# Patient Record
Sex: Female | Born: 1945 | Race: White | Hispanic: No | Marital: Married | Smoking: Former smoker
Health system: Southern US, Community
[De-identification: ages and names within clinical notes are randomized; demographics above are authoritative.]

## PROBLEM LIST (undated history)

## (undated) DIAGNOSIS — I639 Cerebral infarction, unspecified: Secondary | ICD-10-CM

## (undated) DIAGNOSIS — N289 Disorder of kidney and ureter, unspecified: Secondary | ICD-10-CM

## (undated) DIAGNOSIS — I700 Atherosclerosis of aorta: Secondary | ICD-10-CM

## (undated) DIAGNOSIS — F32A Depression, unspecified: Secondary | ICD-10-CM

## (undated) DIAGNOSIS — M1990 Unspecified osteoarthritis, unspecified site: Secondary | ICD-10-CM

## (undated) DIAGNOSIS — R1310 Dysphagia, unspecified: Secondary | ICD-10-CM

## (undated) DIAGNOSIS — R32 Unspecified urinary incontinence: Secondary | ICD-10-CM

## (undated) DIAGNOSIS — R627 Adult failure to thrive: Secondary | ICD-10-CM

## (undated) DIAGNOSIS — U071 COVID-19: Secondary | ICD-10-CM

## (undated) DIAGNOSIS — I712 Thoracic aortic aneurysm, without rupture, unspecified: Secondary | ICD-10-CM

## (undated) DIAGNOSIS — Z87442 Personal history of urinary calculi: Secondary | ICD-10-CM

## (undated) DIAGNOSIS — E0590 Thyrotoxicosis, unspecified without thyrotoxic crisis or storm: Secondary | ICD-10-CM

## (undated) DIAGNOSIS — I10 Essential (primary) hypertension: Secondary | ICD-10-CM

## (undated) DIAGNOSIS — J690 Pneumonitis due to inhalation of food and vomit: Secondary | ICD-10-CM

## (undated) DIAGNOSIS — K219 Gastro-esophageal reflux disease without esophagitis: Secondary | ICD-10-CM

## (undated) DIAGNOSIS — F419 Anxiety disorder, unspecified: Secondary | ICD-10-CM

## (undated) HISTORY — DX: Cerebral infarction, unspecified: I63.9

## (undated) HISTORY — DX: Pneumonitis due to inhalation of food and vomit: J69.0

## (undated) HISTORY — DX: Disorder of kidney and ureter, unspecified: N28.9

## (undated) HISTORY — DX: COVID-19: U07.1

---

## 2021-03-05 ENCOUNTER — Emergency Department (HOSPITAL_COMMUNITY): Payer: Self-pay

## 2021-03-05 ENCOUNTER — Encounter (HOSPITAL_COMMUNITY): Payer: Self-pay | Admitting: Emergency Medicine

## 2021-03-05 ENCOUNTER — Other Ambulatory Visit: Payer: Self-pay

## 2021-03-05 ENCOUNTER — Emergency Department (HOSPITAL_COMMUNITY)
Admission: EM | Admit: 2021-03-05 | Discharge: 2021-03-05 | Disposition: A | Discharge status: Home / Self Care | Payer: Self-pay | Attending: Emergency Medicine | Admitting: Emergency Medicine

## 2021-03-05 DIAGNOSIS — K5730 Diverticulosis of large intestine without perforation or abscess without bleeding: Secondary | ICD-10-CM | POA: Insufficient documentation

## 2021-03-05 DIAGNOSIS — M545 Low back pain, unspecified: Secondary | ICD-10-CM

## 2021-03-05 DIAGNOSIS — R350 Frequency of micturition: Secondary | ICD-10-CM

## 2021-03-05 DIAGNOSIS — F419 Anxiety disorder, unspecified: Secondary | ICD-10-CM | POA: Insufficient documentation

## 2021-03-05 DIAGNOSIS — Z87891 Personal history of nicotine dependence: Secondary | ICD-10-CM | POA: Insufficient documentation

## 2021-03-05 DIAGNOSIS — I10 Essential (primary) hypertension: Secondary | ICD-10-CM | POA: Insufficient documentation

## 2021-03-05 DIAGNOSIS — N1330 Unspecified hydronephrosis: Secondary | ICD-10-CM | POA: Insufficient documentation

## 2021-03-05 DIAGNOSIS — R319 Hematuria, unspecified: Secondary | ICD-10-CM

## 2021-03-05 DIAGNOSIS — N200 Calculus of kidney: Secondary | ICD-10-CM | POA: Insufficient documentation

## 2021-03-05 DIAGNOSIS — N858 Other specified noninflammatory disorders of uterus: Secondary | ICD-10-CM | POA: Insufficient documentation

## 2021-03-05 HISTORY — DX: Essential (primary) hypertension: I10

## 2021-03-05 HISTORY — DX: Anxiety disorder, unspecified: F41.9

## 2021-03-05 LAB — URINALYSIS, ROUTINE W REFLEX MICROSCOPIC
Bacteria, UA: NONE SEEN
Bilirubin Urine: NEGATIVE
Glucose, UA: NEGATIVE mg/dL
Ketones, ur: 20 mg/dL — AB
Leukocytes,Ua: NEGATIVE
Nitrite: NEGATIVE
Protein, ur: 100 mg/dL — AB
Specific Gravity, Urine: 1.021 (ref 1.005–1.030)
pH: 5 (ref 5.0–8.0)

## 2021-03-05 LAB — CBC WITH DIFFERENTIAL/PLATELET
Abs Immature Granulocytes: 0.06 10*3/uL (ref 0.00–0.07)
Basophils Absolute: 0 10*3/uL (ref 0.0–0.1)
Basophils Relative: 0 %
Eosinophils Absolute: 0 10*3/uL (ref 0.0–0.5)
Eosinophils Relative: 0 %
HCT: 41.8 % (ref 36.0–46.0)
Hemoglobin: 13.9 g/dL (ref 12.0–15.0)
Immature Granulocytes: 1 %
Lymphocytes Relative: 12 %
Lymphs Abs: 1.1 10*3/uL (ref 0.7–4.0)
MCH: 30.2 pg (ref 26.0–34.0)
MCHC: 33.3 g/dL (ref 30.0–36.0)
MCV: 90.9 fL (ref 80.0–100.0)
Monocytes Absolute: 0.4 10*3/uL (ref 0.1–1.0)
Monocytes Relative: 5 %
Neutro Abs: 7 10*3/uL (ref 1.7–7.7)
Neutrophils Relative %: 82 %
Platelets: 199 10*3/uL (ref 150–400)
RBC: 4.6 MIL/uL (ref 3.87–5.11)
RDW: 14.8 % (ref 11.5–15.5)
WBC: 8.6 10*3/uL (ref 4.0–10.5)
nRBC: 0 % (ref 0.0–0.2)

## 2021-03-05 LAB — BASIC METABOLIC PANEL
Anion gap: 10 (ref 5–15)
BUN: 37 mg/dL — ABNORMAL HIGH (ref 8–23)
CO2: 22 mmol/L (ref 22–32)
Calcium: 11 mg/dL — ABNORMAL HIGH (ref 8.9–10.3)
Chloride: 106 mmol/L (ref 98–111)
Creatinine, Ser: 1.5 mg/dL — ABNORMAL HIGH (ref 0.44–1.00)
GFR, Estimated: 37 mL/min — ABNORMAL LOW (ref >60)
Glucose, Bld: 87 mg/dL (ref 70–99)
Potassium: 4.1 mmol/L (ref 3.5–5.1)
Sodium: 138 mmol/L (ref 135–145)

## 2021-03-05 MED ORDER — CEPHALEXIN 500 MG PO CAPS: 500.0000 mg | ORAL_CAPSULE | Freq: Three times a day (TID) | ORAL | 0 refills | Status: DC

## 2021-03-05 MED ORDER — SODIUM CHLORIDE 0.9 % IV BOLUS
1000.0000 mL | Freq: Once | INTRAVENOUS | Status: AC
  Administered 2021-03-05: 1000 mL via INTRAVENOUS

## 2021-03-05 MED ORDER — ONDANSETRON HCL 4 MG/2ML IJ SOLN
4.0000 mg | Freq: Once | INTRAMUSCULAR | Status: AC
  Administered 2021-03-05: 4 mg via INTRAVENOUS
  Filled 2021-03-05: qty 2

## 2021-03-05 NOTE — ED Provider Notes (Signed)
[facility] EMERGENCY DEPARTMENT Provider Note   CSN: [id number] Arrival date & time: 03/05/21  1108     History Chief Complaint  Patient presents with   Urinary Frequency    Amber [name] is a 75 y.o. female.  She has a history of longstanding anxiety.  She is brought in by family member for evaluation of low back pain left greater than right along with urinary frequency and urgency.  No fevers and chills.  No abdominal pain.  The history is provided by the patient.  Urinary Frequency This is a new problem. The current episode started yesterday. The problem occurs hourly. The problem has not changed since onset.Pertinent negatives include no chest pain, no abdominal pain, no headaches and no shortness of breath. Nothing aggravates the symptoms. Nothing relieves the symptoms. She has tried rest for the symptoms. The treatment provided no relief.      Past Medical History:  Diagnosis Date   Anxiety    Hypertension     There are no problems to display for this patient.   History reviewed. No pertinent surgical history.   OB History   No obstetric history on file.     History reviewed. No pertinent family history.  Social History   Tobacco Use   Smoking status: Former    Types: Cigarettes   Smokeless tobacco: Never  Substance Use Topics   Alcohol use: Not Currently   Drug use: Not Currently    Home Medications Prior to Admission medications   Not on File    Allergies    Shellfish allergy  Review of Systems   Review of Systems  Constitutional:  Negative for fever.  HENT:  Negative for sore throat.   Eyes:  Negative for visual disturbance.  Respiratory:  Negative for shortness of breath.   Cardiovascular:  Negative for chest pain.  Gastrointestinal:  Negative for abdominal pain.  Genitourinary:  Positive for frequency. Negative for dysuria.  Musculoskeletal:  Positive for back pain.  Skin:  Negative for rash.  Neurological:  Negative for headaches.    Physical Exam Updated Vital Signs BP 116/82   Pulse 84   Temp (!) 97.5 F (36.4 C) (Oral)   Resp 16   Ht 5\' 6"  (1.676 m)   Wt 70.3 kg   SpO2 99%   BMI 25.02 kg/m   Physical Exam Vitals and nursing note reviewed.  Constitutional:      General: She is not in acute distress.    Appearance: Normal appearance. She is well-developed.  HENT:     Head: Normocephalic and atraumatic.  Eyes:     Conjunctiva/sclera: Conjunctivae normal.  Cardiovascular:     Rate and Rhythm: Normal rate and regular rhythm.     Heart sounds: No murmur heard. Pulmonary:     Effort: Pulmonary effort is normal. No respiratory distress.     Breath sounds: Normal breath sounds.  Abdominal:     Palpations: Abdomen is soft.     Tenderness: There is no abdominal tenderness. There is no guarding or rebound.  Musculoskeletal:        General: Normal range of motion.     Cervical back: Neck supple.     Right lower leg: No edema.     Left lower leg: No edema.  Skin:    General: Skin is warm and dry.  Neurological:     General: No focal deficit present.     Mental Status: She is alert.     Sensory: No  sensory deficit.     Motor: No weakness.    ED Results / Procedures / Treatments   Labs (all labs ordered are listed, but only abnormal results are displayed) Labs Reviewed  BASIC METABOLIC PANEL - Abnormal; Notable for the following components:      Result Value   BUN 37 (*)    Creatinine, Ser 1.50 (*)    Calcium 11.0 (*)    GFR, Estimated 37 (*)    All other components within normal limits  URINALYSIS, ROUTINE W REFLEX MICROSCOPIC - Abnormal; Notable for the following components:   Hgb urine dipstick SMALL (*)    Ketones, ur 20 (*)    Protein, ur 100 (*)    All other components within normal limits  CBC WITH DIFFERENTIAL/PLATELET    EKG None  Radiology CT Renal Stone Study  Result Date: 03/05/2021 CLINICAL DATA:  Left flank pain and urgency. EXAM: CT ABDOMEN AND PELVIS WITHOUT CONTRAST  TECHNIQUE: Multidetector CT imaging of the abdomen and pelvis was performed following the standard protocol without IV contrast. COMPARISON:  None. FINDINGS: Lower chest: No acute abnormality. Hepatobiliary: A 2.5 cm x 1.6 cm cyst is seen within the anterior aspect of the left lobe of the liver. This is limited in evaluation in the absence of intravenous contrast. No gallstones, gallbladder wall thickening, or biliary dilatation. Pancreas: Unremarkable. No pancreatic ductal dilatation or surrounding inflammatory changes. Spleen: Normal in size without focal abnormality. Adrenals/Urinary Tract: Adrenal glands are unremarkable. Kidneys are normal in size without focal lesions. A cluster of 3 mm nonobstructing renal stones is seen within the lower pole of the left kidney. Adjacent 5 mm and 3 mm nonobstructing renal calculi are seen within the dependent portion of the right renal pelvis. Mild right-sided hydronephrosis is noted. Bladder is unremarkable. Stomach/Bowel: Stomach is within normal limits. Appendix appears normal. A moderate amount of stool is seen within the distal sigmoid colon and rectum. No evidence of bowel wall thickening, distention, or inflammatory changes. Noninflamed diverticula are seen throughout the large bowel. Vascular/Lymphatic: Aortic atherosclerosis. No enlarged abdominal or pelvic lymph nodes. Reproductive: The uterus is normal in size and appearance. A 2.6 cm x 2.3 cm complex appearing cystic area is seen within the left adnexa (axial CT image 66, CT series 2). Other: No abdominal wall hernia or abnormality. No abdominopelvic ascites. Musculoskeletal: No acute or significant osseous findings. IMPRESSION: 1. Mild right-sided hydronephrosis without evidence of obstructing renal calculi. 2. Bilateral nonobstructing renal calculi. 3. Colonic diverticulosis. 4. 2.6 cm x 2.3 cm complex appearing cystic area within the left adnexa. Recommend further evaluation with pelvic ultrasound. Aortic  Atherosclerosis (ICD10-I70.0). Electronically Signed   By: Virgina Norfolk M.D.   On: 03/05/2021 17:06    Procedures Procedures   Medications Ordered in ED Medications  sodium chloride 0.9 % bolus 1,000 mL (has no administration in time range)    ED Course  I have reviewed the triage vital signs and the nursing notes.  Pertinent labs & imaging results that were available during my care of the patient were reviewed by me and considered in my medical decision making (see chart for details).  Clinical Course as of 03/05/21 2051  Mon Mar 05, 2021  1458 Signs of hematuria minimal signs of infection.  Put her in for CT but patient is hesitant to do that.  She would rather be treated with antibiotics first and come back if this is not improving.  Also reviewed her elevated creatinine without priors to compare with.  She was received a liter of fluid here.  Recommended he touch base with her PCP regarding her continued anxiety. [MB]  1529 I was informed by the nurse that the patient is now agreeable to CT. [MB]    Clinical Course User Index [MB] Hayden Rasmussen, MD   MDM Rules/Calculators/A&P                          This patient complains of urinary frequency urgency low back pain; this involves an extensive number of treatment Options and is a complaint that carries with it a high risk of complications and Morbidity. The differential includes pyelonephritis, UTI, musculoskeletal pain, renal colic  I ordered, reviewed and interpreted labs, which included CBC with normal white count stable hemoglobin, chemistries with elevated creatinine no priors to compare with, calcium mildly elevated, urinalysis with more red cells than white cells no clear infection I ordered medication IV fluids I ordered imaging studies which included CT renal and this is pending at signout to oncoming provider Additional history obtained from patient's relative Previous records obtained and reviewed in epic no  recent admissions  After the interventions stated above, I reevaluated the patient and found patient to be improved hemodynamically.  Blood pressure remains elevated.  Her care is signed out to oncoming provider Dr. Roderic Palau to follow-up on results of CT.  Anticipate can discharge plus minus antibiotics with PCP follow-up.    Final Clinical Impression(s) / ED Diagnoses Final diagnoses:  Urinary frequency  Hematuria, unspecified type  Acute right-sided low back pain without sciatica  Anxiety    Rx / DC Orders ED Discharge Orders          Ordered    cephALEXin (KEFLEX) 500 MG capsule  3 times daily        03/05/21 1459             Hayden Rasmussen, MD 03/05/21 2053

## 2021-03-05 NOTE — ED Notes (Signed)
C/o anxiety and depression.  Pt has previous appointment with Dr Posey Pronto Nov. 3rd.  Dr Melina Copa informed.

## 2021-03-05 NOTE — Discharge Instructions (Addendum)
You were seen in the emergency department for low back pain and urinary frequency.  Your urinalysis showed possible signs infection.  Your lab work also showed you may be slightly dehydrated.   We are treating you with antibiotics.  Please return to the emergency department if any worsening or concerning symptoms.  Follow-up with your primary care doctor.  In 2 to 3 days to see if the antibiotic is helping and to check on your urine culture.  Also your doctor needs to schedule an ultrasound of your pelvis

## 2021-03-05 NOTE — ED Notes (Signed)
Pt decided at discharge that she wants CT scan.  Dr Melina Copa informed.  CT called, will form CT in about 20-30 minutes.  Family notified.

## 2021-03-05 NOTE — ED Triage Notes (Signed)
Pt arrives via ED from home d/t urinary frequency x 2 days, pt denies pain w/urination, pt complains of back pain as well. Family states pt seems more anxious than usual and worried pt might have UTI. Pt A&Ox4, vitals WDL in triage.

## 2021-03-29 ENCOUNTER — Ambulatory Visit (INDEPENDENT_AMBULATORY_CARE_PROVIDER_SITE_OTHER): Payer: Self-pay | Admitting: Internal Medicine

## 2021-03-29 ENCOUNTER — Encounter: Payer: Self-pay | Admitting: Internal Medicine

## 2021-03-29 ENCOUNTER — Other Ambulatory Visit: Payer: Self-pay

## 2021-03-29 VITALS — BP 89/62 | HR 71 | Resp 18 | Ht 66.0 in | Wt 160.1 lb

## 2021-03-29 DIAGNOSIS — E559 Vitamin D deficiency, unspecified: Secondary | ICD-10-CM

## 2021-03-29 DIAGNOSIS — E441 Mild protein-calorie malnutrition: Secondary | ICD-10-CM

## 2021-03-29 DIAGNOSIS — R32 Unspecified urinary incontinence: Secondary | ICD-10-CM

## 2021-03-29 DIAGNOSIS — R269 Unspecified abnormalities of gait and mobility: Secondary | ICD-10-CM

## 2021-03-29 DIAGNOSIS — Z7689 Persons encountering health services in other specified circumstances: Secondary | ICD-10-CM

## 2021-03-29 DIAGNOSIS — I700 Atherosclerosis of aorta: Secondary | ICD-10-CM

## 2021-03-29 DIAGNOSIS — N9489 Other specified conditions associated with female genital organs and menstrual cycle: Secondary | ICD-10-CM

## 2021-03-29 DIAGNOSIS — E440 Moderate protein-calorie malnutrition: Secondary | ICD-10-CM | POA: Insufficient documentation

## 2021-03-29 DIAGNOSIS — F339 Major depressive disorder, recurrent, unspecified: Secondary | ICD-10-CM

## 2021-03-29 DIAGNOSIS — F418 Other specified anxiety disorders: Secondary | ICD-10-CM | POA: Insufficient documentation

## 2021-03-29 DIAGNOSIS — Z2821 Immunization not carried out because of patient refusal: Secondary | ICD-10-CM

## 2021-03-29 DIAGNOSIS — Z1329 Encounter for screening for other suspected endocrine disorder: Secondary | ICD-10-CM

## 2021-03-29 MED ORDER — MIRTAZAPINE 7.5 MG PO TABS: 7.5000 mg | ORAL_TABLET | Freq: Every day | ORAL | 1 refills | Status: DC

## 2021-03-29 NOTE — Assessment & Plan Note (Signed)
Symptoms consistent with mixed stress and urge incontinence, does not want any medicine for now Will check UA with reflex culture Kegel exercises advised Scheduled voiding

## 2021-03-29 NOTE — Assessment & Plan Note (Signed)
Did not like Celexa and Lexapro in the past Started on Remeron for added benefit for improving appetite

## 2021-03-29 NOTE — Assessment & Plan Note (Signed)
Noted on CT abdomen - 2.6 cm x 2.3 cm complex appearing cystic area within the left Adnexa. Check US pelvis Refer to Ob/Gyn if abnormal US pelvis

## 2021-03-29 NOTE — Assessment & Plan Note (Signed)
Due to poor p.o. intake Advised to eat at regular intervals and take protein supplements Maintain adequate hydration Started Remeron for depression

## 2021-03-29 NOTE — Progress Notes (Signed)
New Patient Office Visit  Subjective:  Patient ID: Amber [name], female    DOB: 06-26-1945  Age: 75 y.o. MRN: [id number]  CC:  Chief Complaint  Patient presents with   New Patient (Initial Visit)    New patient was seeing dr Glee Arvin has been checking bp at home pt has been feeling really weak lately and off balance pt has had some depression since 02-2020    HPI Amber [name] is a 75 y.o. female with past medical history of HTN, urinary incontinence, depression and aortic atherosclerosis who presents for establishing care. Her daughter is present during the visit.  HTN: She used to take lisinopril and Cardizem, but her BP was running low and she has stopped taking medications.  Her BP was borderline low in the office today.  She denies any headache, dizziness, chest pain, dyspnea or palpitations.  She has been feeling off balance for last few months.  Her gait is unsteady, she has a walker at home, but does not use it regularly.  She also has fine tremors of left hand, but denies any shaking movements.  She also has been feeling depressed lately.  She had tried Lexapro and Celexa, but did not like the mood effects of them.  She denies any SI or HI. She lives her husband and is independent with ADLs.  Denies any recent fall.  Daughter reports that patient does not eat regularly.  Patient denies any nausea, vomiting, abdominal pain, fever, chills, night sweats.  She complains of urinary incontinence, especially while laughing and also reports urge incontinence at times.  She recently had an ER visit for UTI, and has completed Keflex.  Denies any dysuria or hematuria currently.  CT abdomen in the ER showed bilateral nonobstructive renal calculi with mild hydronephrosis and adnexal mass.  She has not had COVID or flu vaccine and denies taking them.  Past Medical History:  Diagnosis Date   Anxiety    Hypertension     History reviewed. No pertinent surgical history.  History reviewed. No  pertinent family history.  Social History   Socioeconomic History   Marital status: Married    Spouse name: Not on file   Number of children: Not on file   Years of education: Not on file   Highest education level: Not on file  Occupational History   Not on file  Tobacco Use   Smoking status: Former    Types: Cigarettes   Smokeless tobacco: Never  Substance and Sexual Activity   Alcohol use: Not Currently   Drug use: Not Currently   Sexual activity: Not Currently  Other Topics Concern   Not on file  Social History Narrative   Not on file   Social Determinants of Health   Financial Resource Strain: Not on file  Food Insecurity: Not on file  Transportation Needs: Not on file  Physical Activity: Not on file  Stress: Not on file  Social Connections: Not on file  Intimate Partner Violence: Not on file    ROS Review of Systems  Constitutional:  Positive for appetite change and fatigue. Negative for chills and fever.  HENT:  Negative for congestion, sinus pressure, sinus pain and sore throat.   Eyes:  Negative for pain and discharge.  Respiratory:  Negative for cough and shortness of breath.   Cardiovascular:  Negative for chest pain and palpitations.  Gastrointestinal:  Negative for abdominal pain, diarrhea, nausea and vomiting.  Endocrine: Negative for polydipsia and polyuria.  Genitourinary:  Positive for urgency. Negative for dysuria and hematuria.  Musculoskeletal:  Negative for neck pain and neck stiffness.  Skin:  Negative for rash.  Neurological:  Positive for weakness. Negative for dizziness.  Psychiatric/Behavioral:  Positive for dysphoric mood. Negative for agitation, behavioral problems and sleep disturbance. The patient is nervous/anxious.    Objective:   Today's Vitals: BP (!) 89/62 (BP Location: Left Arm, Patient Position: Sitting, Cuff Size: Normal)   Pulse 71   Resp 18   Ht _0  (1.676 m)   Wt 160 lb 1.9 oz (72.6 kg)   SpO2 95%   BMI 25.84 kg/m    Physical Exam Vitals reviewed.  Constitutional:      General: She is not in acute distress.    Appearance: She is not diaphoretic.  HENT:     Head: Normocephalic and atraumatic.     Nose: Nose normal.     Mouth/Throat:     Mouth: Mucous membranes are moist.  Eyes:     General: No scleral icterus.    Extraocular Movements: Extraocular movements intact.  Cardiovascular:     Rate and Rhythm: Normal rate and regular rhythm.     Pulses: Normal pulses.     Heart sounds: Normal heart sounds. No murmur heard. Pulmonary:     Breath sounds: Normal breath sounds. No wheezing or rales.  Abdominal:     Palpations: Abdomen is soft.     Tenderness: There is no abdominal tenderness.  Musculoskeletal:     Cervical back: Neck supple. No tenderness.     Right lower leg: No edema.     Left lower leg: No edema.  Skin:    General: Skin is warm.     Findings: No rash.  Neurological:     General: No focal deficit present.     Mental Status: She is alert and oriented to person, place, and time.     Motor: Weakness (4/5 in b/l UE and LE) present.     Gait: Gait abnormal (Slow, unsteady gait).  Psychiatric:        Mood and Affect: Mood normal.        Behavior: Behavior normal.    Assessment & Plan:   Problem List Items Addressed This Visit     Encounter to establish care    Care established Previous chart reviewed History and medications reviewed with the patient      Gait abnormality - Primary    Gait imbalance -slow, unsteady gait No reported falls, has a walker at home Fine tremors of left UE only Also has depression The symptoms are concerning for Parkinson's disease -will refer to neurology for further eval      Relevant Orders   CMP14+EGFR   TSH   Vitamin D (25 hydroxy)   Ambulatory referral to Neurology   Urinary incontinence    Symptoms consistent with mixed stress and urge incontinence, does not want any medicine for now Will check UA with reflex culture Kegel  exercises advised Scheduled voiding      Relevant Orders   UA/M w/rflx Culture, Routine   Mild protein-calorie malnutrition ([place])    Due to poor p.o. intake Advised to eat at regular intervals and take protein supplements Maintain adequate hydration Started Remeron for depression      Relevant Orders   Vitamin D (25 hydroxy)   Depression, recurrent ([place])    Did not like Celexa and Lexapro in the past Started on Remeron for added benefit for improving appetite  Relevant Medications   mirtazapine (REMERON) 7.5 MG tablet   Other Relevant Orders   TSH   Adnexal mass    Noted on CT abdomen - 2.6 cm x 2.3 cm complex appearing cystic area within the left Adnexa. Check US pelvis Refer to Ob/Gyn if abnormal US pelvis      Relevant Orders   US Pelvis Complete   Other Visit Diagnoses     Cardiovascular and Mediastinum  Aortic atherosclerosis ([place])   Noted on CT abdomen BP WNL without antihypertensives currently Does not like to take any medications like statin     Vitamin D deficiency       Relevant Orders   Vitamin D (25 hydroxy)   Screening for thyroid disorder       Relevant Orders   TSH   Refused influenza vaccine           Outpatient Encounter Medications as of 03/29/2021  Medication Sig   mirtazapine (REMERON) 7.5 MG tablet Take 1 tablet (7.5 mg total) by mouth at bedtime.   [DISCONTINUED] cephALEXin (KEFLEX) 500 MG capsule Take 1 capsule (500 mg total) by mouth 3 (three) times daily. (Patient not taking: Reported on 03/29/2021)   [DISCONTINUED] citalopram (CELEXA) 20 MG tablet Take 10 mg by mouth daily. (Patient not taking: Reported on 03/29/2021)   [DISCONTINUED] diltiazem (CARDIZEM CD) 240 MG 24 hr capsule Take 240 mg by mouth daily. (Patient not taking: Reported on 03/29/2021)   [DISCONTINUED] lisinopril (ZESTRIL) 20 MG tablet Take 20 mg by mouth daily. (Patient not taking: Reported on 03/29/2021)   [DISCONTINUED] LORazepam (ATIVAN) 1 MG tablet Take 1 mg by  mouth daily as needed for anxiety. (Patient not taking: Reported on 03/29/2021)   No facility-administered encounter medications on file as of 03/29/2021.    Follow-up: Return in about 3 months (around 06/29/2021).   Lindell Spar, MD

## 2021-03-29 NOTE — Assessment & Plan Note (Signed)
Noted on CT abdomen BP WNL without antihypertensives currently Does not like to take any medications like statin

## 2021-03-29 NOTE — Assessment & Plan Note (Signed)
Care established Previous chart reviewed History and medications reviewed with the patient 

## 2021-03-29 NOTE — Patient Instructions (Signed)
Please start taking Remeron.  Please start taking Vitamin B15 1761 mcg, folic acid 607 mcg and multivitamin once daily.  Please eat at regular intervals and take at least 64 ounces of fluid in a day.  Please check BP at home and contact us if it remains less than 90/60 or above 140/90.

## 2021-03-29 NOTE — Assessment & Plan Note (Signed)
Gait imbalance -slow, unsteady gait No reported falls, has a walker at home Fine tremors of left UE only Also has depression The symptoms are concerning for Parkinson's disease -will refer to neurology for further eval

## 2021-03-31 LAB — TSH: TSH: 0.152 u[IU]/mL — ABNORMAL LOW (ref 0.450–4.500)

## 2021-03-31 LAB — CMP14+EGFR
ALT: 11 IU/L (ref 0–32)
AST: 16 IU/L (ref 0–40)
Albumin/Globulin Ratio: 1.4 (ref 1.2–2.2)
Albumin: 4.1 g/dL (ref 3.7–4.7)
Alkaline Phosphatase: 57 IU/L (ref 44–121)
BUN/Creatinine Ratio: 29 — ABNORMAL HIGH (ref 12–28)
BUN: 64 mg/dL — ABNORMAL HIGH (ref 8–27)
Bilirubin Total: 1.1 mg/dL (ref 0.0–1.2)
CO2: 15 mmol/L — ABNORMAL LOW (ref 20–29)
Calcium: 11.6 mg/dL — ABNORMAL HIGH (ref 8.7–10.3)
Chloride: 103 mmol/L (ref 96–106)
Creatinine, Ser: 2.18 mg/dL — ABNORMAL HIGH (ref 0.57–1.00)
Globulin, Total: 2.9 g/dL (ref 1.5–4.5)
Glucose: 128 mg/dL — ABNORMAL HIGH (ref 70–99)
Potassium: 4.5 mmol/L (ref 3.5–5.2)
Sodium: 143 mmol/L (ref 134–144)
Total Protein: 7 g/dL (ref 6.0–8.5)
eGFR: 23 mL/min/{1.73_m2} — ABNORMAL LOW (ref >59)

## 2021-03-31 LAB — VITAMIN D 25 HYDROXY (VIT D DEFICIENCY, FRACTURES): Vit D, 25-Hydroxy: 6 ng/mL — ABNORMAL LOW (ref 30.0–100.0)

## 2021-04-02 ENCOUNTER — Other Ambulatory Visit: Payer: Self-pay | Admitting: Internal Medicine

## 2021-04-02 ENCOUNTER — Encounter: Payer: Self-pay | Admitting: Internal Medicine

## 2021-04-02 DIAGNOSIS — E0590 Thyrotoxicosis, unspecified without thyrotoxic crisis or storm: Secondary | ICD-10-CM

## 2021-04-02 DIAGNOSIS — E559 Vitamin D deficiency, unspecified: Secondary | ICD-10-CM

## 2021-04-02 DIAGNOSIS — N1832 Chronic kidney disease, stage 3b: Secondary | ICD-10-CM

## 2021-04-02 MED ORDER — VITAMIN D (ERGOCALCIFEROL) 1.25 MG (50000 UNIT) PO CAPS: 50000.0000 [IU] | ORAL_CAPSULE | ORAL | 5 refills | Status: DC

## 2021-04-04 ENCOUNTER — Encounter: Payer: Self-pay | Admitting: Neurology

## 2021-04-04 NOTE — Progress Notes (Signed)
Noted  

## 2021-04-05 LAB — MICROSCOPIC EXAMINATION
Bacteria, UA: NONE SEEN
RBC, Urine: NONE SEEN /hpf (ref 0–2)

## 2021-04-05 LAB — UA/M W/RFLX CULTURE, ROUTINE
Bilirubin, UA: NEGATIVE
Glucose, UA: NEGATIVE
Nitrite, UA: NEGATIVE
Protein,UA: NEGATIVE
RBC, UA: NEGATIVE
Specific Gravity, UA: 1.022 (ref 1.005–1.030)
Urobilinogen, Ur: 1 mg/dL (ref 0.2–1.0)
pH, UA: 5.5 (ref 5.0–7.5)

## 2021-04-05 LAB — URINE CULTURE, REFLEX

## 2021-04-11 ENCOUNTER — Other Ambulatory Visit: Payer: Self-pay

## 2021-04-11 ENCOUNTER — Ambulatory Visit (HOSPITAL_COMMUNITY)
Admission: RE | Admit: 2021-04-11 | Discharge: 2021-04-11 | Disposition: A | Discharge status: Home / Self Care | Payer: Self-pay | Source: Ambulatory Visit | Attending: Internal Medicine | Admitting: Internal Medicine

## 2021-04-11 DIAGNOSIS — N9489 Other specified conditions associated with female genital organs and menstrual cycle: Secondary | ICD-10-CM | POA: Insufficient documentation

## 2021-04-12 NOTE — Progress Notes (Signed)
Assessment/Plan:   1.  Left hemichorea  -It is unclear if this started acute or has been subacute in nature.  Generally, this is structural in nature, and often due to stroke.  She only has it in the left hand, but definitely has other concerning features on her examination, including a wide-based but shuffling gait.  We will start with MRI brain with and without gadolinium.  -Her TSH was low, which certainly could result in some tremor, but would not expect hemichorea.  She has upcoming lab work in which a free T4 and T3 were added.  -If the above MRI is negative, she will need more lab work, including HD labs, although it would be unusual for HD to present with hemichorea.  2.  Depression, paranoia, significant anxiety  -Patient really does have very significant psychiatric issues going on.  It is unclear if those are associated with #1.  Patient's daughter states that she had not left her bedroom for almost 6 months.  Patient has really declined medication.  She certainly needs psychiatric care.  We will leave this to her primary care physician, and hopefully we will be able to see if this is associated with a neurologic disorder from my end in the near future.  No si/hi.  3.  Tachycardia  -Sounded like palpable bigeminy today.  She absolutely declined any testing (daughter was present), including EKG, ER today and virtually ran out of our office, asking multiple times when the end of the appointment would be and when she could leave (part of her anxiety).  I will go ahead and schedule the EKG, although really wish she would have done that today.  Subjective:   Amber [name] was seen today in the movement disorders clinic for neurologic consultation at the request of Lindell Spar, MD.  The consultation is for the evaluation of tremor of the left hand, gait instability and to rule out Parkinson's disease.  Medical records made available to me are reviewed.  Pt with daughter who supplements  the history (supplies most of it).   Specific Symptoms:  Tremor: Yes.  , L hand, started few months ago, daughter notes it most at rest.  Hasn't changed with time.  Its not really a rhythmic tremor but rather a wiggle.  Not sure if came on acutely.  Doesn't think that foot does this.   Family hx of similar:  No., and no fam hx of neurodegenerative disorders.   Postural symptoms:  Yes.    Falls?  No. Bradykinesia symptoms: shuffling gait, slow movements, and difficulty getting out of a chair (6 months per daughter) Loss of smell:  No. Loss of taste:  Yes.   And loss of appetite - gave remeron but she didn't take it. Urinary Incontinence:  Yes.   Constipation: yes Difficulty Swallowing:  Yes.   (She is edentulous) Trouble with ADL's:  No. But she has no motivation to do adl's.  She went for 6 months and wouldn't get out of the bedroom - she doesn't want her kids coming over.   Depression:  Yes.   and paranoia per daughter. (Thinks that people are watching her) Memory changes:  daughter notes some confusion but describes mostly paranoia (her son text her and she said it wasn't her son); she did confuse her house and phone code.  She describes some ocd features. Hallucinations:  No.  visual distortions: Yes.   N/V:  has nausea all of the time Lightheaded:  No.  Syncope: No. Diplopia:  No. Prior exposure to reglan/antipsychotics: No.state that only been on lexapro, ativan/xanax, celexa (may not have taken).  Don't have hx of antipsychotics  Neuroimaging of the brain has not previously been performed.      ALLERGIES:   Allergies  Allergen Reactions   Shellfish Allergy     "I pass out"    CURRENT MEDICATIONS:  Current Outpatient Medications  Medication Instructions   folic acid (FOLVITE) 1 mg, Oral, Daily   mirtazapine (REMERON) 7.5 mg, Oral, Daily at bedtime   vitamin B-12 (CYANOCOBALAMIN) 500 mcg, Oral, Daily   Vitamin D (Ergocalciferol) (DRISDOL) 50,000 Units, Oral, Every 7 days     Objective:   VITALS:   Vitals:   04/16/21 0900  BP: 137/79  Pulse: 74  SpO2: 99%  Weight: 156 lb 6.4 oz (70.9 kg)  Height: 5\' 6"  (1.676 m)    GEN:  The patient appears stated age and is in NAD.  She is anxious and nervous. HEENT:  Normocephalic, atraumatic.  She is edentulous.  The mucous membranes are moist. The superficial temporal arteries are without ropiness or tenderness. CV:  tachy.  Regular at 100 Lungs:  CTAB but sig DOE. Neck/HEME:  There are no carotid bruits bilaterally.  Neurological examination:  Orientation: The patient is alert and oriented x3.  However, she is somewhat paranoid throughout the examination.  She asks numerous times with the examiner is doing and if she is done. Cranial nerves: There is good facial symmetry. Extraocular muscles are intact. The visual fields are full to confrontational testing. The speech is fluent and clear. Soft palate rises symmetrically and there is no tongue deviation. Hearing is intact to conversational tone. Sensation: Sensation is intact to light and pinprick throughout (facial, trunk, extremities). Vibration is intact at the bilateral big toe. There is no extinction with double simultaneous stimulation. There is no sensory dermatomal level identified. Motor: Strength is 5/5 in the bilateral upper extremities and at least 4/5 in the bilateral lower extremities.  She has diffuse giveaway weakness in the bilateral lower extremities and states "I cannot do this."  It does improve with encouragement.  Shoulder shrug is equal and symmetric.  There is no pronator drift. Deep tendon reflexes: Deep tendon reflexes are 3/4 at the bilateral biceps, triceps, brachioradialis, patella and achilles. Plantar responses are downgoing bilaterally.  Movement examination: Tone: There is normal tone in the bilateral upper extremities.  The tone in the lower extremities is normal.  Abnormal movements: there is chorea of the L hand Coordination:   There is no decremation with RAM's, but she does not participate well and becomes distracted and asks if we are almost done and states "I cannot do this." Gait and Station: The patient pushes off of the chair to arise.  She is short stepped and wide-based.  She holds onto the examiner when she walks. I have reviewed and interpreted the following labs independently   Chemistry      Component Value Date/Time   NA 143 03/29/2021 1140   K 4.5 03/29/2021 1140   CL 103 03/29/2021 1140   CO2 15 (L) 03/29/2021 1140   BUN 64 (H) 03/29/2021 1140   CREATININE 2.18 (H) 03/29/2021 1140      Component Value Date/Time   CALCIUM 11.6 (H) 03/29/2021 1140   ALKPHOS 57 03/29/2021 1140   AST 16 03/29/2021 1140   ALT 11 03/29/2021 1140   BILITOT 1.1 03/29/2021 1140      Lab Results  Component  Value Date   TSH 0.152 (L) 03/29/2021   Lab Results  Component Value Date   WBC 8.6 03/05/2021   HGB 13.9 03/05/2021   HCT 41.8 03/05/2021   MCV 90.9 03/05/2021   PLT 199 03/05/2021     Total time spent on today's visit was 60 minutes, including both face-to-face time and nonface-to-face time.  Time included that spent on review of records (prior notes available to me/labs/imaging if pertinent), discussing treatment and goals, answering patient's questions and coordinating care.  Cc:  Lindell Spar, MD

## 2021-04-16 ENCOUNTER — Encounter: Payer: Self-pay | Admitting: Neurology

## 2021-04-16 ENCOUNTER — Ambulatory Visit (INDEPENDENT_AMBULATORY_CARE_PROVIDER_SITE_OTHER): Payer: Self-pay | Admitting: Neurology

## 2021-04-16 ENCOUNTER — Other Ambulatory Visit: Payer: Self-pay | Admitting: *Deleted

## 2021-04-16 ENCOUNTER — Other Ambulatory Visit: Payer: Self-pay

## 2021-04-16 VITALS — BP 137/79 | HR 74 | Ht 66.0 in | Wt 156.4 lb

## 2021-04-16 DIAGNOSIS — G255 Other chorea: Secondary | ICD-10-CM

## 2021-04-16 DIAGNOSIS — R000 Tachycardia, unspecified: Secondary | ICD-10-CM

## 2021-04-16 DIAGNOSIS — R32 Unspecified urinary incontinence: Secondary | ICD-10-CM

## 2021-04-16 DIAGNOSIS — R292 Abnormal reflex: Secondary | ICD-10-CM

## 2021-04-16 DIAGNOSIS — R0602 Shortness of breath: Secondary | ICD-10-CM

## 2021-04-23 ENCOUNTER — Telehealth: Payer: Self-pay

## 2021-04-23 NOTE — Telephone Encounter (Signed)
Patient is scheduled at [facility] for her MRI, May 09 2021 at 1245.

## 2021-04-24 ENCOUNTER — Encounter (HOSPITAL_COMMUNITY): Payer: Self-pay

## 2021-04-24 ENCOUNTER — Encounter: Payer: Self-pay | Admitting: Internal Medicine

## 2021-04-24 ENCOUNTER — Emergency Department (HOSPITAL_COMMUNITY): Payer: Self-pay

## 2021-04-24 ENCOUNTER — Emergency Department (HOSPITAL_COMMUNITY)
Admission: EM | Admit: 2021-04-24 | Discharge: 2021-04-24 | Disposition: A | Discharge status: Home / Self Care | Payer: Self-pay | Attending: Emergency Medicine | Admitting: Emergency Medicine

## 2021-04-24 DIAGNOSIS — R000 Tachycardia, unspecified: Secondary | ICD-10-CM | POA: Insufficient documentation

## 2021-04-24 DIAGNOSIS — F03918 Unspecified dementia, unspecified severity, with other behavioral disturbance: Secondary | ICD-10-CM | POA: Insufficient documentation

## 2021-04-24 DIAGNOSIS — I10 Essential (primary) hypertension: Secondary | ICD-10-CM | POA: Insufficient documentation

## 2021-04-24 DIAGNOSIS — Z87891 Personal history of nicotine dependence: Secondary | ICD-10-CM | POA: Insufficient documentation

## 2021-04-24 DIAGNOSIS — I700 Atherosclerosis of aorta: Secondary | ICD-10-CM | POA: Insufficient documentation

## 2021-04-24 LAB — PROTIME-INR
INR: 1.1 (ref 0.8–1.2)
Prothrombin Time: 14.4 seconds (ref 11.4–15.2)

## 2021-04-24 LAB — URINALYSIS, MICROSCOPIC (REFLEX): RBC / HPF: >50 RBC/hpf (ref 0–5)

## 2021-04-24 LAB — COMPREHENSIVE METABOLIC PANEL
ALT: 12 U/L (ref 0–44)
AST: 15 U/L (ref 15–41)
Albumin: 3.6 g/dL (ref 3.5–5.0)
Alkaline Phosphatase: 65 U/L (ref 38–126)
Anion gap: 13 (ref 5–15)
BUN: 29 mg/dL — ABNORMAL HIGH (ref 8–23)
CO2: 17 mmol/L — ABNORMAL LOW (ref 22–32)
Calcium: 10.4 mg/dL — ABNORMAL HIGH (ref 8.9–10.3)
Chloride: 108 mmol/L (ref 98–111)
Creatinine, Ser: 1.5 mg/dL — ABNORMAL HIGH (ref 0.44–1.00)
GFR, Estimated: 37 mL/min — ABNORMAL LOW (ref >60)
Glucose, Bld: 104 mg/dL — ABNORMAL HIGH (ref 70–99)
Potassium: 3.5 mmol/L (ref 3.5–5.1)
Sodium: 138 mmol/L (ref 135–145)
Total Bilirubin: 1.7 mg/dL — ABNORMAL HIGH (ref 0.3–1.2)
Total Protein: 7 g/dL (ref 6.5–8.1)

## 2021-04-24 LAB — CBC
HCT: 38.5 % (ref 36.0–46.0)
Hemoglobin: 12.6 g/dL (ref 12.0–15.0)
MCH: 30.5 pg (ref 26.0–34.0)
MCHC: 32.7 g/dL (ref 30.0–36.0)
MCV: 93.2 fL (ref 80.0–100.0)
Platelets: 262 10*3/uL (ref 150–400)
RBC: 4.13 MIL/uL (ref 3.87–5.11)
RDW: 14.6 % (ref 11.5–15.5)
WBC: 6.6 10*3/uL (ref 4.0–10.5)
nRBC: 0 % (ref 0.0–0.2)

## 2021-04-24 LAB — URINALYSIS, ROUTINE W REFLEX MICROSCOPIC
Glucose, UA: NEGATIVE mg/dL
Ketones, ur: 15 mg/dL — AB
Leukocytes,Ua: NEGATIVE
Nitrite: NEGATIVE
Protein, ur: 100 mg/dL — AB
Specific Gravity, Urine: >1.03 — ABNORMAL HIGH (ref 1.005–1.030)
pH: 5 (ref 5.0–8.0)

## 2021-04-24 LAB — CBG MONITORING, ED: Glucose-Capillary: 95 mg/dL (ref 70–99)

## 2021-04-24 MED ORDER — LORAZEPAM 2 MG/ML IJ SOLN
0.5000 mg | Freq: Once | INTRAMUSCULAR | Status: AC
  Administered 2021-04-24: 0.5 mg via INTRAVENOUS

## 2021-04-24 MED ORDER — IOHEXOL 350 MG/ML SOLN
60.0000 mL | Freq: Once | INTRAVENOUS | Status: AC | PRN
  Administered 2021-04-24: 60 mL via INTRAVENOUS

## 2021-04-24 MED ORDER — ONDANSETRON HCL 4 MG/2ML IJ SOLN
4.0000 mg | Freq: Once | INTRAMUSCULAR | Status: AC
  Administered 2021-04-24: 4 mg via INTRAVENOUS

## 2021-04-24 NOTE — ED Provider Notes (Signed)
[facility] EMERGENCY DEPARTMENT Provider Note   CSN: [id number] Arrival date & time: 04/24/21  8182     History Chief Complaint  Patient presents with   Altered Mental Status    Amber [name] is a 75 y.o. female with PMH anxiety, HTN who presents emergency department as a transfer from [facility] due to concern for abnormal MRI findings.  Patient initially presented for behavioral disturbances including reclusive behavior and patient was told she needs an MRI by neurology.  An MRI of the brain including an MRV was performed that showed a possible filling defect in the venous system with concern for dural venous sinus thrombosis.  The CT scanner at [facility] broke down and thus she was transferred to [facility] to obtain a CT venogram.  Patient arrives alert and oriented answering all questions appropriately with no complaints of chest pain, shortness of breath, abdominal pain, nausea, vomiting or other systemic symptoms.   Altered Mental Status Associated symptoms: no abdominal pain, no fever, no palpitations, no rash, no seizures and no vomiting       Past Medical History:  Diagnosis Date   Anxiety    Hypertension     Patient Active Problem List   Diagnosis Date Noted   Encounter to establish care 03/29/2021   Gait abnormality 03/29/2021   Urinary incontinence 03/29/2021   Mild protein-calorie malnutrition ([place]) 03/29/2021   Depression, recurrent ([place]) 03/29/2021   Aortic atherosclerosis ([place]) 03/29/2021   Adnexal mass 03/29/2021    No past surgical history on file.   OB History   No obstetric history on file.     Family History  Problem Relation Age of Onset   High blood pressure Maternal Grandmother     Social History   Tobacco Use   Smoking status: Former    Types: Cigarettes   Smokeless tobacco: Never  Substance Use Topics   Alcohol use: Not Currently   Drug use: Not Currently    Home Medications Prior to Admission medications    Medication Sig Start Date End Date Taking? Authorizing Provider  folic acid (FOLVITE) 1 MG tablet Take 1 mg by mouth daily.   Yes [provider]  mirtazapine (REMERON) 7.5 MG tablet Take 1 tablet (7.5 mg total) by mouth at bedtime. 03/29/21  Yes Lindell Spar, MD  vitamin B-12 (CYANOCOBALAMIN) 500 MCG tablet Take 500 mcg by mouth daily.   Yes [provider]  Vitamin D, Ergocalciferol, (DRISDOL) 1.25 MG (50000 UNIT) CAPS capsule Take 1 capsule (50,000 Units total) by mouth every 7 (seven) days. 04/02/21  Yes Lindell Spar, MD    Allergies    Shellfish allergy  Review of Systems   Review of Systems  Constitutional:  Negative for chills and fever.  HENT:  Negative for ear pain and sore throat.   Eyes:  Negative for pain and visual disturbance.  Respiratory:  Negative for cough and shortness of breath.   Cardiovascular:  Negative for chest pain and palpitations.  Gastrointestinal:  Negative for abdominal pain and vomiting.  Genitourinary:  Negative for dysuria and hematuria.  Musculoskeletal:  Negative for arthralgias and back pain.  Skin:  Negative for color change and rash.  Neurological:  Negative for seizures and syncope.  All other systems reviewed and are negative.  Physical Exam Updated Vital Signs BP (!) 148/92   Pulse (!) 117   Temp 97.9 F (36.6 C) (Axillary)   Resp 20   Ht 5\' 6"  (1.676 m)  Wt 70.8 kg   SpO2 100%   BMI 25.18 kg/m   Physical Exam Vitals and nursing note reviewed.  Constitutional:      General: She is not in acute distress.    Appearance: She is well-developed.  HENT:     Head: Normocephalic and atraumatic.  Eyes:     Conjunctiva/sclera: Conjunctivae normal.  Cardiovascular:     Rate and Rhythm: Regular rhythm. Tachycardia present.     Heart sounds: No murmur heard. Pulmonary:     Effort: Pulmonary effort is normal. No respiratory distress.     Breath sounds: Normal breath sounds.  Abdominal:     Palpations: Abdomen is  soft.     Tenderness: There is no abdominal tenderness.  Musculoskeletal:        General: No swelling.     Cervical back: Neck supple.  Skin:    General: Skin is warm and dry.     Capillary Refill: Capillary refill takes less than 2 seconds.  Neurological:     Mental Status: She is alert.  Psychiatric:        Mood and Affect: Mood normal.    ED Results / Procedures / Treatments   Labs (all labs ordered are listed, but only abnormal results are displayed) Labs Reviewed  COMPREHENSIVE METABOLIC PANEL - Abnormal; Notable for the following components:      Result Value   CO2 17 (*)    Glucose, Bld 104 (*)    BUN 29 (*)    Creatinine, Ser 1.50 (*)    Calcium 10.4 (*)    Total Bilirubin 1.7 (*)    GFR, Estimated 37 (*)    All other components within normal limits  URINALYSIS, ROUTINE W REFLEX MICROSCOPIC - Abnormal; Notable for the following components:   APPearance HAZY (*)    Specific Gravity, Urine >1.030 (*)    Hgb urine dipstick LARGE (*)    Bilirubin Urine MODERATE (*)    Ketones, ur 15 (*)    Protein, ur 100 (*)    All other components within normal limits  URINALYSIS, MICROSCOPIC (REFLEX) - Abnormal; Notable for the following components:   Bacteria, UA FEW (*)    All other components within normal limits  CBC  PROTIME-INR  CBG MONITORING, ED    EKG EKG Interpretation  Date/Time:  Tuesday April 24 2021 22:04:03 EST Ventricular Rate:  108 PR Interval:  157 QRS Duration: 125 QT Interval:  328 QTC Calculation: 440 R Axis:   -50 Text Interpretation: Sinus tachycardia Left bundle branch block Confirmed by [place] ([id number]) on 04/24/2021 10:08:54 PM  Radiology DG Chest 2 View  Result Date: 04/24/2021 CLINICAL DATA:  Evaluate for pneumonia. EXAM: CHEST - 2 VIEW COMPARISON:  None. FINDINGS: The heart size and mediastinal contours are within normal limits. Both lungs are clear. The visualized skeletal structures are unremarkable. IMPRESSION: No active  cardiopulmonary disease. Electronically Signed   By: Ronney Asters M.D.   On: 04/24/2021 21:57   MR BRAIN WO CONTRAST  Result Date: 04/24/2021 CLINICAL DATA:  Mental status changes of unknown cause. EXAM: MRI HEAD WITHOUT CONTRAST TECHNIQUE: Multiplanar, multiecho pulse sequences of the brain and surrounding structures were obtained without intravenous contrast. COMPARISON:  None. FINDINGS: The examination was abbreviated because of patient combativeness. Brain: Diffusion imaging does not show any acute or subacute infarction. No focal abnormality affects the brainstem or cerebellum. 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 No cortical or large vessel territory infarction. Vascular: Major vessels at the base of the brain show flow. Question thrombosis of the left transverse sinus, sigmoid sinus and jugular vein. Skull and upper cervical spine: Negative Sinuses/Orbits: Clear/normal Other: None IMPRESSION: No acute brain abnormality seen. Cerebral Atrophy (ICD10-G31.9). Chronic small-vessel ischemic change of the white matter and left basal ganglia. Question thrombosis of the left transverse sinus, sigmoid sinus and jugular vein. Consider CT venography for further evaluation. Electronically Signed   By: Nelson Chimes M.D.   On: 04/24/2021 11:22   MR Venogram Head  Result Date: 04/24/2021 CLINICAL DATA:  Possible left transverse sinus thrombosis on MRI EXAM: MR VENOGRAM HEAD WITHOUT CONTRAST TECHNIQUE: Angiographic images of the intracranial venous structures were acquired using MRV technique without intravenous contrast. COMPARISON:  Noncontrast brain MRI obtained earlier the same day. FINDINGS: The left transverse sinus is mildly irregular and diminutive compared to the right with intermittent flow related enhancement. The remainder of the venous sinuses are patent. IMPRESSION: Irregularity of the  left transverse sinus raises concern for partial thrombosis, with differential including slow flow within a diminutive sinus. CT venogram with contrast is recommended. Electronically Signed   By: Valetta Mole M.D.   On: 04/24/2021 16:29   CT VENOGRAM HEAD  Result Date: 04/24/2021 CLINICAL DATA:  Nausea and confusion EXAM: CT VENOGRAM HEAD TECHNIQUE: Venographic phase images of the brain were obtained following the administration of intravenous contrast. Multiplanar reformats and maximum intensity projections were generated. CONTRAST:  80mL OMNIPAQUE IOHEXOL 350 MG/ML SOLN COMPARISON:  MRV brain 04/24/2021 FINDINGS: Brain: There is no mass, hemorrhage or extra-axial collection. The size and configuration of the ventricles and extra-axial CSF spaces are normal. There is hypoattenuation of the white matter, most commonly indicating chronic small vessel disease. Vascular: No abnormal hyperdensity of the major intracranial arteries or dural venous sinuses. No intracranial atherosclerosis. Superior sagittal sinus: Normal. Straight sinus: Normal. Inferior sagittal sinus, vein of Galen and internal cerebral veins: Normal. Transverse sinuses: Normal. Sigmoid sinuses: Normal. Visualized jugular veins: Normal. Skull: The visualized skull base, calvarium and extracranial soft tissues are normal. Sinuses/Orbits: No fluid levels or advanced mucosal thickening of the visualized paranasal sinuses. No mastoid or middle ear effusion. The orbits are normal. IMPRESSION: 1. No dural venous sinus thrombosis. 2. Chronic small vessel disease. Electronically Signed   By: Ulyses Jarred M.D.   On: 04/24/2021 21:15    Procedures Procedures   Medications Ordered in ED Medications  ondansetron (ZOFRAN) injection 4 mg (4 mg Intravenous Given 04/24/21 0942)  LORazepam (ATIVAN) injection 0.5 mg (0.5 mg Intravenous Given 04/24/21 0951)  ondansetron (ZOFRAN) injection 4 mg (4 mg Intravenous Given 04/24/21 1727)  LORazepam (ATIVAN)  injection 0.5 mg (0.5 mg Intravenous Given 04/24/21 1904)  iohexol (OMNIPAQUE) 350 MG/ML injection 60 mL (60 mLs Intravenous Contrast Given 04/24/21 2049)    ED Course  I have reviewed the triage vital signs and the nursing notes.  Pertinent labs & imaging results that were available during my care of the patient were reviewed by me and considered in my medical decision making (see chart for details).    MDM Rules/Calculators/A&P                           Patient seen emergency department for evaluation of abnormal MRI findings and need for CT venogram.  CT venogram obtained with no evidence of dural venous sinus thrombosis.  I close the loop with  neurology and they stated that given that the patient's symptoms do not correlate with dural venous sinus thrombosis and she has a negative CT venogram, this diagnosis is quite unlikely.  Patient did have some mild persistent tachycardia and in the setting of no white blood cell count , no complaints of fever, no abdominal pain, diarrhea chest pain or cough, and a negative chest x-ray as well as no urinalysis, very low suspicion for sepsis as the cause of her tachycardia.  The patient's underlying anxiety may play a role here, but I suspect this tachycardia is multifactorial.  She was encouraged to drink lots of water and hydrate before her follow-up appointment tomorrow.  I obtained an EKG that showed sinus tachycardia with left bundle branch block.  Patient then discharged with primary care follow-up tomorrow. Final Clinical Impression(s) / ED Diagnoses Final diagnoses:  Dementia with behavioral disturbance    Rx / DC Orders ED Discharge Orders     None        Bow Buntyn, Debe Coder, MD 04/24/21 2219

## 2021-04-24 NOTE — ED Notes (Signed)
Pt arrived via [facility] from [place]. Pt c/o bilateral leg weakness and intermittent forgetfulness/confusion.

## 2021-04-24 NOTE — ED Notes (Signed)
Patient transported to MRI 

## 2021-04-24 NOTE — Discharge Instructions (Signed)
You were seen in the emergency department for evaluation of a possible abnormality on your MRI.  The confirmatory CAT scan was reassuringly normal and at this time we do not have a perfect explanation for your behavioral disturbances.  However, at this time you are safe for discharge from the emergency department as there is no evidence of life-threatening pathology and you have appropriate follow-up with your primary care physicians.  Please call your primary care physician to establish close follow-up and return the emergency department if you have fever, vomiting, chest pain, shortness of breath or any other concerning symptoms.

## 2021-04-24 NOTE — ED Triage Notes (Addendum)
Pt from home with complaints of altered mental status, confusion, paranoia, not eating but drinking only water. Did see neurology last week and MRI is ordered. Mental status seems to be in more of a decline over the past month. Also complaints of nausea over the past week. Requesting nausea medication.

## 2021-04-24 NOTE — ED Notes (Signed)
Report called to [facility] ED charge RN Roselyn Reef RN )

## 2021-04-24 NOTE — ED Notes (Signed)
Pt talking with nurse and daughter about how pt doesn't feel as if she doesn't deserve to eat due to some bad things that she has previously done. Pt speaks about having trouble to swallow and she feels she is more nervous and paranoid then normal. Pt verbalized she has no appetite and she feels as if she is letting her family down.

## 2021-04-24 NOTE — ED Provider Notes (Signed)
[facility] EMERGENCY DEPARTMENT Provider Note   CSN: [id number] Arrival date & time: 04/24/21  6073     History Chief Complaint  Patient presents with   Altered Mental Status    Amber [name] is a 75 y.o. female.  Pt's family reports pt has been refusing to eat.  Family reports pt is drinking normally  Pt has become increasing reclusive.  Pt's daughter reports pt gets angry when they visit.  Pt was seen by Neurology who advised pt needs an MRI.  Pt complains of nausea.    The history is provided by the patient. No language interpreter was used.  Altered Mental Status Presenting symptoms: behavior changes and confusion   Severity:  Severe Most recent episode:  More than 2 days ago (months) Episode history:  Multiple Timing:  Constant Progression:  Worsening Context: not alcohol use, not nursing home resident, not recent illness and not recent infection   Associated symptoms: no abdominal pain and no nausea       Past Medical History:  Diagnosis Date   Anxiety    Hypertension     Patient Active Problem List   Diagnosis Date Noted   Encounter to establish care 03/29/2021   Gait abnormality 03/29/2021   Urinary incontinence 03/29/2021   Mild protein-calorie malnutrition ([place]) 03/29/2021   Depression, recurrent ([place]) 03/29/2021   Aortic atherosclerosis ([place]) 03/29/2021   Adnexal mass 03/29/2021    No past surgical history on file.   OB History   No obstetric history on file.     Family History  Problem Relation Age of Onset   High blood pressure Maternal Grandmother     Social History   Tobacco Use   Smoking status: Former    Types: Cigarettes   Smokeless tobacco: Never  Substance Use Topics   Alcohol use: Not Currently   Drug use: Not Currently    Home Medications Prior to Admission medications   Medication Sig Start Date End Date Taking? Authorizing Provider  folic acid (FOLVITE) 1 MG tablet Take 1 mg by mouth daily.   Yes [provider]  mirtazapine (REMERON) 7.5 MG tablet Take 1 tablet (7.5 mg total) by mouth at bedtime. 03/29/21  Yes Lindell Spar, MD  vitamin B-12 (CYANOCOBALAMIN) 500 MCG tablet Take 500 mcg by mouth daily.   Yes [provider]  Vitamin D, Ergocalciferol, (DRISDOL) 1.25 MG (50000 UNIT) CAPS capsule Take 1 capsule (50,000 Units total) by mouth every 7 (seven) days. 04/02/21  Yes Lindell Spar, MD    Allergies    Shellfish allergy  Review of Systems   Review of Systems  Gastrointestinal:  Negative for abdominal pain and nausea.  Psychiatric/Behavioral:  Positive for confusion.   All other systems reviewed and are negative.  Physical Exam Updated Vital Signs BP 132/86   Pulse 100   Temp 97.9 F (36.6 C) (Axillary)   Resp 18   Ht 5\' 6"  (1.676 m)   Wt 70.8 kg   SpO2 98%   BMI 25.18 kg/m   Physical Exam Vitals and nursing note reviewed.  Constitutional:      Appearance: Normal appearance. She is well-developed.  HENT:     Head: Normocephalic.     Right Ear: External ear normal.     Left Ear: External ear normal.     Nose: Nose normal.     Mouth/Throat:     Mouth: Mucous membranes are moist.  Eyes:     Extraocular Movements: Extraocular movements  intact.     Pupils: Pupils are equal, round, and reactive to light.  Cardiovascular:     Rate and Rhythm: Normal rate.  Pulmonary:     Effort: Pulmonary effort is normal.  Abdominal:     General: Abdomen is flat. There is no distension.  Musculoskeletal:        General: Normal range of motion.     Cervical back: Normal range of motion.  Skin:    General: Skin is warm.  Neurological:     General: No focal deficit present.     Mental Status: She is alert and oriented to person, place, and time.     Comments: Moving all extremities   Psychiatric:        Mood and Affect: Mood normal.    ED Results / Procedures / Treatments   Labs (all labs ordered are listed, but only abnormal results are displayed) Labs Reviewed   COMPREHENSIVE METABOLIC PANEL - Abnormal; Notable for the following components:      Result Value   CO2 17 (*)    Glucose, Bld 104 (*)    BUN 29 (*)    Creatinine, Ser 1.50 (*)    Calcium 10.4 (*)    Total Bilirubin 1.7 (*)    GFR, Estimated 37 (*)    All other components within normal limits  URINALYSIS, ROUTINE W REFLEX MICROSCOPIC - Abnormal; Notable for the following components:   APPearance HAZY (*)    Specific Gravity, Urine >1.030 (*)    Hgb urine dipstick LARGE (*)    Bilirubin Urine MODERATE (*)    Ketones, ur 15 (*)    Protein, ur 100 (*)    All other components within normal limits  URINALYSIS, MICROSCOPIC (REFLEX) - Abnormal; Notable for the following components:   Bacteria, UA FEW (*)    All other components within normal limits  CBC  PROTIME-INR  CBG MONITORING, ED    EKG None  Radiology MR BRAIN WO CONTRAST  Result Date: 04/24/2021 CLINICAL DATA:  Mental status changes of unknown cause. EXAM: MRI HEAD WITHOUT CONTRAST TECHNIQUE: Multiplanar, multiecho pulse sequences of the brain and surrounding structures were obtained without intravenous contrast. COMPARISON:  None. FINDINGS: The examination was abbreviated because of patient combativeness. Brain: Diffusion imaging does not show any acute or subacute infarction. No focal abnormality affects the brainstem or cerebellum. 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 No cortical or large vessel territory infarction. Vascular: Major vessels at the base of the brain show flow. Question thrombosis of the left transverse sinus, sigmoid sinus and jugular vein. Skull and upper cervical spine: Negative Sinuses/Orbits: Clear/normal Other: None IMPRESSION: No acute brain abnormality seen. Cerebral Atrophy (ICD10-G31.9). Chronic small-vessel ischemic change of the white matter and left basal ganglia. Question thrombosis of  the left transverse sinus, sigmoid sinus and jugular vein. Consider CT venography for further evaluation. Electronically Signed   By: Nelson Chimes M.D.   On: 04/24/2021 11:22   MR Venogram Head  Result Date: 04/24/2021 CLINICAL DATA:  Possible left transverse sinus thrombosis on MRI EXAM: MR VENOGRAM HEAD WITHOUT CONTRAST TECHNIQUE: Angiographic images of the intracranial venous structures were acquired using MRV technique without intravenous contrast. COMPARISON:  Noncontrast brain MRI obtained earlier the same day. FINDINGS: The left transverse sinus is mildly irregular and diminutive compared to the right with intermittent flow related enhancement. The remainder of the venous sinuses are patent. IMPRESSION:  Irregularity of the left transverse sinus raises concern for partial thrombosis, with differential including slow flow within a diminutive sinus. CT venogram with contrast is recommended. Electronically Signed   By: Valetta Mole M.D.   On: 04/24/2021 16:29    Procedures Procedures   Medications Ordered in ED Medications  ondansetron (ZOFRAN) injection 4 mg (4 mg Intravenous Given 04/24/21 0942)  LORazepam (ATIVAN) injection 0.5 mg (0.5 mg Intravenous Given 04/24/21 7366)    ED Course  I have reviewed the triage vital signs and the nursing notes.  Pertinent labs & imaging results that were available during my care of the patient were reviewed by me and considered in my medical decision making (see chart for details).    MDM Rules/Calculators/A&P                           MDM:  MRi brain shows questionable thrombosis left transverse sinus  I discussed pt with Dr. Jeralyn Ruths radiologist who advised MRI venogram.  Venogram reviewed.  Radiolost advised Ct venogram.  I discussed pt with Dr. Leonel Ramsay who advised pt needs to have Ct.  I discussed transfer with Dr. Parke Simmers.  Pt to go to [facility] hospital for ct venogram.  If ct shows thrombosis pt will need admission for anticoagulation.   Final  Clinical Impression(s) / ED Diagnoses Final diagnoses:  Altered mental status, unspecified altered mental status type    Rx / DC Orders ED Discharge Orders     None        Sidney Ace 04/24/21 Ysidro Evert, MD 04/25/21 1646

## 2021-04-24 NOTE — ED Notes (Signed)
Pt verbalized she still didn't have to urinate .

## 2021-04-25 ENCOUNTER — Encounter: Payer: Self-pay | Admitting: Neurology

## 2021-04-25 ENCOUNTER — Other Ambulatory Visit: Payer: Self-pay

## 2021-04-25 ENCOUNTER — Telehealth: Payer: Self-pay

## 2021-04-25 ENCOUNTER — Ambulatory Visit (INDEPENDENT_AMBULATORY_CARE_PROVIDER_SITE_OTHER): Payer: Self-pay | Admitting: Internal Medicine

## 2021-04-25 ENCOUNTER — Encounter: Payer: Self-pay | Admitting: Internal Medicine

## 2021-04-25 VITALS — BP 118/82 | HR 74 | Resp 18 | Ht 67.0 in | Wt 157.0 lb

## 2021-04-25 DIAGNOSIS — R110 Nausea: Secondary | ICD-10-CM

## 2021-04-25 DIAGNOSIS — Z5181 Encounter for therapeutic drug level monitoring: Secondary | ICD-10-CM

## 2021-04-25 DIAGNOSIS — E441 Mild protein-calorie malnutrition: Secondary | ICD-10-CM

## 2021-04-25 DIAGNOSIS — R638 Other symptoms and signs concerning food and fluid intake: Secondary | ICD-10-CM

## 2021-04-25 DIAGNOSIS — R4182 Altered mental status, unspecified: Secondary | ICD-10-CM

## 2021-04-25 DIAGNOSIS — K219 Gastro-esophageal reflux disease without esophagitis: Secondary | ICD-10-CM

## 2021-04-25 DIAGNOSIS — Z09 Encounter for follow-up examination after completed treatment for conditions other than malignant neoplasm: Secondary | ICD-10-CM

## 2021-04-25 DIAGNOSIS — R6889 Other general symptoms and signs: Secondary | ICD-10-CM

## 2021-04-25 DIAGNOSIS — F418 Other specified anxiety disorders: Secondary | ICD-10-CM

## 2021-04-25 DIAGNOSIS — Z13820 Encounter for screening for osteoporosis: Secondary | ICD-10-CM | POA: Insufficient documentation

## 2021-04-25 DIAGNOSIS — G9340 Encephalopathy, unspecified: Secondary | ICD-10-CM

## 2021-04-25 DIAGNOSIS — G255 Other chorea: Secondary | ICD-10-CM

## 2021-04-25 DIAGNOSIS — E0590 Thyrotoxicosis, unspecified without thyrotoxic crisis or storm: Secondary | ICD-10-CM

## 2021-04-25 DIAGNOSIS — Z1211 Encounter for screening for malignant neoplasm of colon: Secondary | ICD-10-CM | POA: Insufficient documentation

## 2021-04-25 MED ORDER — ONDANSETRON HCL 4 MG PO TABS: 4.0000 mg | ORAL_TABLET | Freq: Three times a day (TID) | ORAL | 1 refills | Status: DC | PRN

## 2021-04-25 MED ORDER — FAMOTIDINE 20 MG PO TABS: 20.0000 mg | ORAL_TABLET | Freq: Two times a day (BID) | ORAL | 2 refills | Status: DC

## 2021-04-25 NOTE — Assessment & Plan Note (Signed)
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

## 2021-04-25 NOTE — Assessment & Plan Note (Signed)
Started Pepcid as needed for GERD Zofran as needed for nausea Needs to eat at regular intervals Avoid hot and spicy food

## 2021-04-25 NOTE — Patient Instructions (Signed)
Please start taking Remeron as prescribed.  Please take Zofran as needed for nausea. Avoid hot and spicy food. Please avoid skipping any meals.  Please maintain at least 64 ounces of fluid in a day.  Please check with Neurology clinic about the blood tests.

## 2021-04-25 NOTE — Telephone Encounter (Signed)
Pt daughter called and informed Reviewed MRI.  Was done without contrast (we wanted with contrast).  Won't make her go back b/c per reports exam was already abbreviated b/c pt combattive.  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 Daughter informed that  we won't repeat ekg/mri now.  She needs more labs and that I will fax them to [facility] so she can get them done,

## 2021-04-25 NOTE — Assessment & Plan Note (Signed)
Lab Results  Component Value Date   TSH 0.152 (L) 03/29/2021   Recheck TSH along with free T4 and T3 If abnormal, will refer to endocrinology Hyperthyroidism can explain some of her symptoms like severe anxiety, tremors, agitation and weight loss

## 2021-04-25 NOTE — Assessment & Plan Note (Signed)
Due to poor p.o. intake Advised to eat at regular intervals and take protein supplements Maintain adequate hydration Started Remeron for depression

## 2021-04-25 NOTE — Telephone Encounter (Signed)
Reviewed MRI.  Was done without contrast (we wanted with contrast).  Won't make her go back b/c per reports exam was already abbreviated b/c pt combattive.  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  Tell daughter we won't repeat ekg/mri now.  She needs more labs.  ceruloplasmin, copper, LFTs, TSH, PTH, ferritin, sedimentation rate, ANA, antiphospholipid antibody, lupus anticoagulant, RPR, B12, antigliadin antibody, Athena labs: HD labs (they will need to sign for that one - see me if questions on how to order that).  Dx:  chorea, mental status change, med monitor, acute encephalopathy

## 2021-04-25 NOTE — Assessment & Plan Note (Addendum)
Severe anxiety leading to nausea, agitation and lack of appetite Did not like Celexa and Lexapro in the past Had started on Remeron for added benefit for improving appetite, but she did not take it -advised to take it regularly Daughter also agrees that she needs to be compliant with her medications.

## 2021-04-25 NOTE — Progress Notes (Signed)
hos  Established Patient Office Visit  Subjective:  Patient ID: Amber [name], female    DOB: [birth year]  Age: 75 y.o. MRN: [id number]  CC:  Chief Complaint  Patient presents with   Follow-up    3 week follow up some things are about the same and some things are worse she has saw neurology and was at [facility] yesterday 04-24-21 would like to go over all this     HPI Amber [name] is a 75 y.o. female with past medical history of aortic atherosclerosis, depression with anxiety and malnutrition who presents for f/u of chronic medical conditions and recent ER visit yesterday.  She fell at home in her bathroom while trying to get up from her toilet seat.  She felt lightheaded and felt like her legs gave up before she fell.  She had MRI of the head and CT venogram in the ER.  MRI of the brain showed chronic microvascular changes.  She was given Ativan for agitation in the ER.  She felt weak and drowsy in the morning today, which could be due to Ativan.  She feels better now.  She was started on Remeron for depression with anxiety and for weight gain benefit.  She has not been taking Remeron as she was concerned about its side effects.  Her daughter states that she has been trying to convince her to take Remeron, and patient is now agreeing to take it regularly.  She has had episodes of severe anxiety leading to nausea and lack of appetite.  She was given Zofran in the ER yesterday for nausea and she felt better with it.  She asks for a refill of Zofran.  Blood test from ER show mild improvement in her kidney function compared to prior.  She states that she has been trying to improve her hydration at home.  Of note, she has lost about 3 pounds since the last visit.  She states that she does not feel like eating due to severe nausea.  Past Medical History:  Diagnosis Date   Anxiety    Hypertension     History reviewed. No pertinent surgical history.  Family History  Problem Relation Age of Onset    High blood pressure Maternal Grandmother     Social History   Socioeconomic History   Marital status: Married    Spouse name: Not on file   Number of children: Not on file   Years of education: Not on file   Highest education level: Not on file  Occupational History   Not on file  Tobacco Use   Smoking status: Former    Types: Cigarettes   Smokeless tobacco: Never  Substance and Sexual Activity   Alcohol use: Not Currently   Drug use: Not Currently   Sexual activity: Not Currently  Other Topics Concern   Not on file  Social History Narrative   Right handed    one story home    lives with family    Doesn't work currently   Social Determinants of Radio broadcast assistant Strain: Not on file  Food Insecurity: Not on file  Transportation Needs: Not on file  Physical Activity: Not on file  Stress: Not on file  Social Connections: Not on file  Intimate Partner Violence: Not on file    Outpatient Medications Prior to Visit  Medication Sig Dispense Refill   folic acid (FOLVITE) 1 MG tablet Take 1 mg by mouth daily.     mirtazapine (REMERON)  7.5 MG tablet Take 1 tablet (7.5 mg total) by mouth at bedtime. 30 tablet 1   vitamin B-12 (CYANOCOBALAMIN) 500 MCG tablet Take 500 mcg by mouth daily.     Vitamin D, Ergocalciferol, (DRISDOL) 1.25 MG (50000 UNIT) CAPS capsule Take 1 capsule (50,000 Units total) by mouth every 7 (seven) days. 5 capsule 5   No facility-administered medications prior to visit.    Allergies  Allergen Reactions   Shellfish Allergy     "I pass out"    ROS Review of Systems  Constitutional:  Positive for appetite change and fatigue. Negative for chills and fever.  HENT:  Negative for congestion, sinus pressure, sinus pain and sore throat.   Eyes:  Negative for pain and discharge.  Respiratory:  Negative for cough and shortness of breath.   Cardiovascular:  Negative for chest pain and palpitations.  Gastrointestinal:  Negative for abdominal pain,  diarrhea, nausea and vomiting.  Endocrine: Negative for polydipsia and polyuria.  Genitourinary:  Positive for urgency. Negative for dysuria and hematuria.  Musculoskeletal:  Negative for neck pain and neck stiffness.  Skin:  Negative for rash.  Neurological:  Positive for tremors and weakness. Negative for dizziness.  Psychiatric/Behavioral:  Positive for dysphoric mood. Negative for agitation, behavioral problems and sleep disturbance. The patient is nervous/anxious.      Objective:    Physical Exam Vitals reviewed.  Constitutional:      General: She is not in acute distress.    Appearance: She is not diaphoretic.  HENT:     Head: Normocephalic and atraumatic.     Nose: Nose normal.     Mouth/Throat:     Mouth: Mucous membranes are moist.  Eyes:     General: No scleral icterus.    Extraocular Movements: Extraocular movements intact.  Cardiovascular:     Rate and Rhythm: Normal rate and regular rhythm.     Pulses: Normal pulses.     Heart sounds: Normal heart sounds. No murmur heard. Pulmonary:     Breath sounds: Normal breath sounds. No wheezing or rales.  Abdominal:     Palpations: Abdomen is soft.     Tenderness: There is no abdominal tenderness.  Musculoskeletal:     Cervical back: Neck supple. No tenderness.     Right lower leg: No edema.     Left lower leg: No edema.  Skin:    General: Skin is warm.     Findings: No rash.  Neurological:     General: No focal deficit present.     Mental Status: She is alert and oriented to person, place, and time.     Motor: Weakness (4/5 in b/l UE and LE) present.     Gait: Gait abnormal (Slow, unsteady gait).  Psychiatric:        Mood and Affect: Mood normal.        Behavior: Behavior normal.    BP 118/82 (BP Location: Left Arm, Patient Position: Sitting, Cuff Size: Normal)   Pulse 74   Resp 18   Ht _0  (1.702 m)   Wt 157 lb (71.2 kg)   SpO2 93%   BMI 24.59 kg/m  Wt Readings from Last 3 Encounters:  04/25/21 157 lb  (71.2 kg)  04/24/21 156 lb (70.8 kg)  04/16/21 156 lb 6.4 oz (70.9 kg)    Lab Results  Component Value Date   TSH 0.152 (L) 03/29/2021   Lab Results  Component Value Date   WBC 6.6 04/24/2021   HGB  12.6 04/24/2021   HCT 38.5 04/24/2021   MCV 93.2 04/24/2021   PLT 262 04/24/2021   Lab Results  Component Value Date   NA 138 04/24/2021   K 3.5 04/24/2021   CO2 17 (L) 04/24/2021   GLUCOSE 104 (H) 04/24/2021   BUN 29 (H) 04/24/2021   CREATININE 1.50 (H) 04/24/2021   BILITOT 1.7 (H) 04/24/2021   ALKPHOS 65 04/24/2021   AST 15 04/24/2021   ALT 12 04/24/2021   PROT 7.0 04/24/2021   ALBUMIN 3.6 04/24/2021   CALCIUM 10.4 (H) 04/24/2021   ANIONGAP 13 04/24/2021   EGFR 23 (L) 03/29/2021   No results found for: CHOL No results found for: HDL No results found for: LDLCALC No results found for: TRIG No results found for: CHOLHDL No results found for: HGBA1C    Assessment & Plan:   Problem List Items Addressed This Visit       Digestive   Gastroesophageal reflux disease    Started Pepcid as needed for GERD Zofran as needed for nausea Needs to eat at regular intervals Avoid hot and spicy food      Relevant Medications   ondansetron (ZOFRAN) 4 MG tablet   famotidine (PEPCID) 20 MG tablet     Endocrine   Hyperthyroidism    Lab Results  Component Value Date   TSH 0.152 (L) 03/29/2021  Recheck TSH along with free T4 and T3 If abnormal, will refer to endocrinology Hyperthyroidism can explain some of her symptoms like severe anxiety, tremors, agitation and weight loss      Relevant Orders   TSH+T4F+T3Free     Other   Mild protein-calorie malnutrition (HCC)    Due to poor p.o. intake Advised to eat at regular intervals and take protein supplements Maintain adequate hydration Started Remeron for depression      Depression with anxiety - Primary    Severe anxiety leading to nausea, agitation and lack of appetite Did not like Celexa and Lexapro in the past Had  started on Remeron for added benefit for improving appetite, but she did not take it -advised to take it regularly Daughter also agrees that she needs to be compliant with her medications.      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    Dispense:  30 tablet    Refill:  1   famotidine (PEPCID) 20 MG tablet    Sig: Take 1 tablet (20 mg total) by mouth 2 (two) times daily.    Dispense:  60 tablet    Refill:  2    Follow-up: Return in about 2 months (around 06/25/2021).    Lindell Spar, MD

## 2021-04-30 ENCOUNTER — Encounter: Payer: Self-pay | Admitting: Adult Health

## 2021-05-09 ENCOUNTER — Ambulatory Visit (HOSPITAL_COMMUNITY): Payer: Self-pay

## 2021-05-10 ENCOUNTER — Encounter: Payer: Self-pay | Admitting: Internal Medicine

## 2021-05-10 LAB — ANTIPHOSPHOLIPID SYNDROME COMP
APTT: 26.3 s
Anticardiolipin Ab, IgA: <10 [APL'U]
Anticardiolipin Ab, IgG: <10 [GPL'U]
Anticardiolipin Ab, IgM: 21 [MPL'U] — ABNORMAL HIGH
Antiphosphatidylserine IgG: 1 {GPS'U}
Antiphosphatidylserine IgM: 2 {MPS'U}
Antiprothrombin Antibody, IgG: 4 G units
Beta-2 Glycoprotein I, IgA: <10 SAU
Beta-2 Glycoprotein I, IgG: <10 SGU
Beta-2 Glycoprotein I, IgM: <10 SMU
DRVVT Screen Seconds: 32.6 s
Hexagonal Phospholipid Neutral: 0 s
Platelet Neutralization: 0 s

## 2021-05-10 LAB — HEPATIC FUNCTION PANEL
ALT: 7 IU/L (ref 0–32)
AST: 13 IU/L (ref 0–40)
Albumin: 3.4 g/dL — ABNORMAL LOW (ref 3.7–4.7)
Alkaline Phosphatase: 85 IU/L (ref 44–121)
Bilirubin Total: 1.2 mg/dL (ref 0.0–1.2)
Bilirubin, Direct: 0.59 mg/dL — ABNORMAL HIGH (ref 0.00–0.40)
Total Protein: 6.5 g/dL (ref 6.0–8.5)

## 2021-05-10 LAB — LUPUS ANTICOAGULANT PANEL
Dilute Viper Venom Time: 32.5 s (ref 0.0–47.0)
PTT Lupus Anticoagulant: 33.1 s (ref 0.0–51.9)

## 2021-05-10 LAB — PARATHYROID HORMONE, INTACT (NO CA): PTH: 165 pg/mL — ABNORMAL HIGH (ref 15–65)

## 2021-05-10 LAB — TSH: TSH: 1.56 u[IU]/mL (ref 0.450–4.500)

## 2021-05-10 LAB — RPR: RPR Ser Ql: NONREACTIVE

## 2021-05-10 LAB — VITAMIN B12: Vitamin B-12: 428 pg/mL (ref 232–1245)

## 2021-05-10 LAB — FERRITIN: Ferritin: 855 ng/mL — ABNORMAL HIGH (ref 15–150)

## 2021-05-10 LAB — SEDIMENTATION RATE: Sed Rate: 39 mm/hr (ref 0–40)

## 2021-05-10 LAB — CERULOPLASMIN: Ceruloplasmin: 27.3 mg/dL (ref 19.0–39.0)

## 2021-05-10 LAB — ANA: Anti Nuclear Antibody (ANA): POSITIVE — AB

## 2021-05-11 ENCOUNTER — Telehealth: Payer: Self-pay | Admitting: Neurology

## 2021-05-11 NOTE — Telephone Encounter (Signed)
Let pt know that I got results of blood tests.  I can see they have discussed with PCP some already.  As he told them the elevated PTH could be from her kidney disease.  This one is a bit out of my field so will need to rely on his expertise.  Her ferritin is also pretty elevated.  That could just be an acute phase reactant (meaning an indicator of inflammation in body).  It is pretty high though.  Would need to look at other iron studies to make sure nothing else going on including TIBC, %iron con.  We can do that or we can send her to hematology for consultation.  I believe we are still waiting on [facility] labs (were those done - check on those please)

## 2021-05-11 NOTE — Telephone Encounter (Signed)
Called daughter [name] and read recommendations from Dr. Carles Collet and she understood and will follow up with Dr. Posey Pronto about any further testing needed for Ferritin levels. Called [facility] and they processed paperwork yesterday and are calling patients daughter today to talk about Insurance since patient only has Medicare {Part A

## 2021-05-27 DIAGNOSIS — I2699 Other pulmonary embolism without acute cor pulmonale: Secondary | ICD-10-CM

## 2021-05-27 DIAGNOSIS — I82409 Acute embolism and thrombosis of unspecified deep veins of unspecified lower extremity: Secondary | ICD-10-CM

## 2021-05-27 HISTORY — DX: Acute embolism and thrombosis of unspecified deep veins of unspecified lower extremity: I82.409

## 2021-05-27 HISTORY — DX: Other pulmonary embolism without acute cor pulmonale: I26.99

## 2021-06-06 ENCOUNTER — Telehealth: Payer: Self-pay | Admitting: Internal Medicine

## 2021-06-06 ENCOUNTER — Encounter: Payer: Self-pay | Admitting: Internal Medicine

## 2021-06-06 NOTE — Telephone Encounter (Signed)
Daughter [name] called in on pt behalf in regard to [place] decline   Daughter states she has sent Mychart messages in regards as well.   Would like a call back to discuss Mothers decline in health.

## 2021-06-06 NOTE — Telephone Encounter (Signed)
Pt made phone appt with [facility]el 06-07-21

## 2021-06-06 NOTE — Telephone Encounter (Signed)
Spoke with daughter [name] made appt

## 2021-06-07 ENCOUNTER — Ambulatory Visit (INDEPENDENT_AMBULATORY_CARE_PROVIDER_SITE_OTHER): Payer: Self-pay | Admitting: Internal Medicine

## 2021-06-07 ENCOUNTER — Encounter: Payer: Self-pay | Admitting: Internal Medicine

## 2021-06-07 ENCOUNTER — Other Ambulatory Visit: Payer: Self-pay

## 2021-06-07 DIAGNOSIS — Z2821 Immunization not carried out because of patient refusal: Secondary | ICD-10-CM

## 2021-06-07 DIAGNOSIS — F418 Other specified anxiety disorders: Secondary | ICD-10-CM

## 2021-06-07 DIAGNOSIS — R45851 Suicidal ideations: Secondary | ICD-10-CM

## 2021-06-07 DIAGNOSIS — E212 Other hyperparathyroidism: Secondary | ICD-10-CM | POA: Insufficient documentation

## 2021-06-07 DIAGNOSIS — E210 Primary hyperparathyroidism: Secondary | ICD-10-CM

## 2021-06-07 DIAGNOSIS — E440 Moderate protein-calorie malnutrition: Secondary | ICD-10-CM

## 2021-06-07 NOTE — Patient Instructions (Signed)
Please take her to nearest ER for evaluation and possible inpatient Psychiatry treatment.

## 2021-06-07 NOTE — Progress Notes (Signed)
Virtual Visit via Telephone Note   This visit type was conducted due to national recommendations for restrictions regarding the COVID-19 Pandemic (e.g. social distancing) in an effort to limit this patient's exposure and mitigate transmission in our community.  Due to her co-morbid illnesses, this patient is at least at moderate risk for complications without adequate follow up.  This format is felt to be most appropriate for this patient at this time.  The patient did not have access to video technology/had technical difficulties with video requiring transitioning to audio format only (telephone).  All issues noted in this document were discussed and addressed.  No physical exam could be performed with this format.  Evaluation Performed:  Follow-up visit  Date:  06/07/2021   ID:  Amber, [name] Aug 25, 1945, MRN [id number]  Patient Location: Home Provider Location: Office/Clinic  Participants: Patient and her daughter Calla Kicks Location of Patient: Home Location of Provider: Telehealth Consent was obtain for visit to be over via telehealth. I verified that I am speaking with the correct person using two identifiers.  PCP:  Lindell Spar, MD   Chief Complaint: Radene Knee in mental status and failure to thrive  History of Present Illness:    Amber [name] is a 76 y.o. female who has a televisit for complaint of recent change in mental status and failure to thrive.  Her daughter states that she has not been eating her meals.  She complains of dysphagia, but daughter states that her swallowing is normal when she tries to distract her.   She has been losing weight as she is not eating well.  Daughter prepares meals for her, but she throws them away later. Patient has been having delusions and visual hallucinations at times.  She denies to me at in an effort to die soon.  Daughter feels that she is giving up.  She attributes her symptoms to uncontrolled depression with anxiety.  Of note, she  has not been taking her Remeron and other meds recently.  She has insomnia.  She also feels insecure and paranoid at times and does not let her husband leave her home.  She does not have any homicidal ideation.  The patient does not have symptoms concerning for COVID-19 infection (fever, chills, cough, or new shortness of breath).   Past Medical, Surgical, Social History, Allergies, and Medications have been Reviewed.  Past Medical History:  Diagnosis Date   Anxiety    Hypertension    History reviewed. No pertinent surgical history.   No outpatient medications have been marked as taking for the 06/07/21 encounter (Office Visit) with Lindell Spar, MD.     Allergies:   Shellfish allergy   ROS:   Please see the history of present illness.     All other systems reviewed and are negative.   Labs/Other Tests and Data Reviewed:    Recent Labs: 04/24/2021: BUN 29; Creatinine, Ser 1.50; Hemoglobin 12.6; Platelets 262; Potassium 3.5; Sodium 138 05/04/2021: ALT 7; TSH 1.560   Recent Lipid Panel No results found for: CHOL, TRIG, HDL, CHOLHDL, LDLCALC, LDLDIRECT  Wt Readings from Last 3 Encounters:  04/25/21 157 lb (71.2 kg)  04/24/21 156 lb (70.8 kg)  04/16/21 156 lb 6.4 oz (70.9 kg)     ASSESSMENT & PLAN:    Depression with anxiety Severe anxiety leading to nausea, agitation and lack of appetite Did not like Celexa and Lexapro in the past Had started on Remeron for added benefit for improving  appetite, but she did not take it She has suicidal ideation now - advised daughter to take her to nearest ER for possible inpatient psychiatry management  Moderate protein-calorie malnutrition ([place]) Due to poor p.o. intake Advised to eat at regular intervals and take protein supplements Maintain adequate hydration Needs to take Remeron for depression  Primary hyperparathyroidism ([place]) Plan was to recheck PTH and intact Ca. But patient having failure to thrive and psychiatric  symptoms, advised to go to ER   Time:   Today, I have spent 17 minutes reviewing the chart, including problem list, medications, and with the patient with telehealth technology discussing the above problems.   Medication Adjustments/Labs and Tests Ordered: Current medicines are reviewed at length with the patient today.  Concerns regarding medicines are outlined above.   Tests Ordered: Orders Placed This Encounter  Procedures   Ambulatory referral to Psychiatry    Medication Changes: No orders of the defined types were placed in this encounter.    Note: This dictation was prepared with Dragon dictation along with smaller phrase technology. Similar sounding words can be transcribed inadequately or may not be corrected upon review. Any transcriptional errors that result from this process are unintentional.      Disposition:  Follow up  Signed, Lindell Spar, MD  06/07/2021 1:26 PM     [facility]

## 2021-06-07 NOTE — Assessment & Plan Note (Signed)
Due to poor p.o. intake Advised to eat at regular intervals and take protein supplements Maintain adequate hydration Needs to take Remeron for depression

## 2021-06-07 NOTE — Assessment & Plan Note (Signed)
Severe anxiety leading to nausea, agitation and lack of appetite Did not like Celexa and Lexapro in the past Had started on Remeron for added benefit for improving appetite, but she did not take it She has suicidal ideation now - advised daughter to take her to nearest ER for possible inpatient psychiatry management

## 2021-06-07 NOTE — Assessment & Plan Note (Signed)
Plan was to recheck PTH and intact Ca. But patient having failure to thrive and psychiatric symptoms, advised to go to ER

## 2021-06-08 ENCOUNTER — Emergency Department (HOSPITAL_COMMUNITY): Payer: Medicare Other

## 2021-06-08 ENCOUNTER — Inpatient Hospital Stay (HOSPITAL_COMMUNITY)
Admission: EM | Admit: 2021-06-08 | Discharge: 2021-06-19 | DRG: 177 | Disposition: A | Discharge status: Other Acute Inpatient Hospital | Payer: Medicare Other | Source: Skilled Nursing Facility | Attending: Internal Medicine | Admitting: Internal Medicine

## 2021-06-08 ENCOUNTER — Inpatient Hospital Stay (HOSPITAL_COMMUNITY): Payer: Medicare Other

## 2021-06-08 ENCOUNTER — Other Ambulatory Visit: Payer: Self-pay

## 2021-06-08 DIAGNOSIS — E860 Dehydration: Secondary | ICD-10-CM | POA: Diagnosis present

## 2021-06-08 DIAGNOSIS — Z87891 Personal history of nicotine dependence: Secondary | ICD-10-CM | POA: Diagnosis not present

## 2021-06-08 DIAGNOSIS — Z4659 Encounter for fitting and adjustment of other gastrointestinal appliance and device: Secondary | ICD-10-CM

## 2021-06-08 DIAGNOSIS — I447 Left bundle-branch block, unspecified: Secondary | ICD-10-CM | POA: Diagnosis present

## 2021-06-08 DIAGNOSIS — R54 Age-related physical debility: Secondary | ICD-10-CM | POA: Diagnosis present

## 2021-06-08 DIAGNOSIS — Z91013 Allergy to seafood: Secondary | ICD-10-CM

## 2021-06-08 DIAGNOSIS — E870 Hyperosmolality and hypernatremia: Secondary | ICD-10-CM

## 2021-06-08 DIAGNOSIS — E43 Unspecified severe protein-calorie malnutrition: Secondary | ICD-10-CM | POA: Diagnosis present

## 2021-06-08 DIAGNOSIS — D259 Leiomyoma of uterus, unspecified: Secondary | ICD-10-CM | POA: Diagnosis present

## 2021-06-08 DIAGNOSIS — N2581 Secondary hyperparathyroidism of renal origin: Secondary | ICD-10-CM | POA: Diagnosis present

## 2021-06-08 DIAGNOSIS — U071 COVID-19: Secondary | ICD-10-CM

## 2021-06-08 DIAGNOSIS — Z79899 Other long term (current) drug therapy: Secondary | ICD-10-CM

## 2021-06-08 DIAGNOSIS — E559 Vitamin D deficiency, unspecified: Secondary | ICD-10-CM | POA: Diagnosis present

## 2021-06-08 DIAGNOSIS — G9341 Metabolic encephalopathy: Secondary | ICD-10-CM | POA: Diagnosis present

## 2021-06-08 DIAGNOSIS — E440 Moderate protein-calorie malnutrition: Secondary | ICD-10-CM | POA: Diagnosis not present

## 2021-06-08 DIAGNOSIS — E872 Acidosis, unspecified: Secondary | ICD-10-CM | POA: Diagnosis present

## 2021-06-08 DIAGNOSIS — I248 Other forms of acute ischemic heart disease: Secondary | ICD-10-CM | POA: Diagnosis present

## 2021-06-08 DIAGNOSIS — K219 Gastro-esophageal reflux disease without esophagitis: Secondary | ICD-10-CM | POA: Diagnosis present

## 2021-06-08 DIAGNOSIS — F410 Panic disorder [episodic paroxysmal anxiety] without agoraphobia: Secondary | ICD-10-CM | POA: Diagnosis not present

## 2021-06-08 DIAGNOSIS — N132 Hydronephrosis with renal and ureteral calculous obstruction: Secondary | ICD-10-CM | POA: Diagnosis present

## 2021-06-08 DIAGNOSIS — A419 Sepsis, unspecified organism: Secondary | ICD-10-CM

## 2021-06-08 DIAGNOSIS — N179 Acute kidney failure, unspecified: Secondary | ICD-10-CM

## 2021-06-08 DIAGNOSIS — R0602 Shortness of breath: Secondary | ICD-10-CM

## 2021-06-08 DIAGNOSIS — R45851 Suicidal ideations: Secondary | ICD-10-CM | POA: Diagnosis not present

## 2021-06-08 DIAGNOSIS — E861 Hypovolemia: Secondary | ICD-10-CM | POA: Diagnosis present

## 2021-06-08 DIAGNOSIS — R627 Adult failure to thrive: Secondary | ICD-10-CM

## 2021-06-08 DIAGNOSIS — F332 Major depressive disorder, recurrent severe without psychotic features: Secondary | ICD-10-CM

## 2021-06-08 DIAGNOSIS — I700 Atherosclerosis of aorta: Secondary | ICD-10-CM | POA: Diagnosis present

## 2021-06-08 DIAGNOSIS — N1832 Chronic kidney disease, stage 3b: Secondary | ICD-10-CM | POA: Diagnosis present

## 2021-06-08 DIAGNOSIS — F333 Major depressive disorder, recurrent, severe with psychotic symptoms: Secondary | ICD-10-CM | POA: Diagnosis present

## 2021-06-08 DIAGNOSIS — J690 Pneumonitis due to inhalation of food and vomit: Secondary | ICD-10-CM | POA: Diagnosis not present

## 2021-06-08 DIAGNOSIS — Z91199 Patient's noncompliance with other medical treatment and regimen due to unspecified reason: Secondary | ICD-10-CM | POA: Diagnosis not present

## 2021-06-08 DIAGNOSIS — I959 Hypotension, unspecified: Secondary | ICD-10-CM | POA: Diagnosis present

## 2021-06-08 DIAGNOSIS — I82532 Chronic embolism and thrombosis of left popliteal vein: Secondary | ICD-10-CM | POA: Diagnosis present

## 2021-06-08 DIAGNOSIS — Z6822 Body mass index (BMI) 22.0-22.9, adult: Secondary | ICD-10-CM

## 2021-06-08 DIAGNOSIS — N1831 Chronic kidney disease, stage 3a: Secondary | ICD-10-CM | POA: Diagnosis not present

## 2021-06-08 DIAGNOSIS — K5641 Fecal impaction: Secondary | ICD-10-CM | POA: Diagnosis not present

## 2021-06-08 DIAGNOSIS — I129 Hypertensive chronic kidney disease with stage 1 through stage 4 chronic kidney disease, or unspecified chronic kidney disease: Secondary | ICD-10-CM | POA: Diagnosis present

## 2021-06-08 DIAGNOSIS — Z818 Family history of other mental and behavioral disorders: Secondary | ICD-10-CM

## 2021-06-08 DIAGNOSIS — E210 Primary hyperparathyroidism: Secondary | ICD-10-CM | POA: Diagnosis not present

## 2021-06-08 DIAGNOSIS — Z28310 Unvaccinated for covid-19: Secondary | ICD-10-CM

## 2021-06-08 DIAGNOSIS — Z66 Do not resuscitate: Secondary | ICD-10-CM | POA: Diagnosis not present

## 2021-06-08 DIAGNOSIS — D62 Acute posthemorrhagic anemia: Secondary | ICD-10-CM | POA: Diagnosis not present

## 2021-06-08 DIAGNOSIS — E876 Hypokalemia: Secondary | ICD-10-CM | POA: Diagnosis present

## 2021-06-08 DIAGNOSIS — R310 Gross hematuria: Secondary | ICD-10-CM | POA: Diagnosis not present

## 2021-06-08 DIAGNOSIS — Z8673 Personal history of transient ischemic attack (TIA), and cerebral infarction without residual deficits: Secondary | ICD-10-CM

## 2021-06-08 DIAGNOSIS — F323 Major depressive disorder, single episode, severe with psychotic features: Secondary | ICD-10-CM

## 2021-06-08 DIAGNOSIS — R000 Tachycardia, unspecified: Secondary | ICD-10-CM | POA: Diagnosis not present

## 2021-06-08 DIAGNOSIS — R1310 Dysphagia, unspecified: Secondary | ICD-10-CM

## 2021-06-08 DIAGNOSIS — R338 Other retention of urine: Secondary | ICD-10-CM | POA: Diagnosis not present

## 2021-06-08 DIAGNOSIS — Z7401 Bed confinement status: Secondary | ICD-10-CM

## 2021-06-08 LAB — CBC WITH DIFFERENTIAL/PLATELET
Abs Immature Granulocytes: 0.1 10*3/uL — ABNORMAL HIGH (ref 0.00–0.07)
Basophils Absolute: 0 10*3/uL (ref 0.0–0.1)
Basophils Relative: 0 %
Eosinophils Absolute: 0 10*3/uL (ref 0.0–0.5)
Eosinophils Relative: 0 %
HCT: 40.5 % (ref 36.0–46.0)
Hemoglobin: 12.7 g/dL (ref 12.0–15.0)
Immature Granulocytes: 1 %
Lymphocytes Relative: 31 %
Lymphs Abs: 3 10*3/uL (ref 0.7–4.0)
MCH: 29.5 pg (ref 26.0–34.0)
MCHC: 31.4 g/dL (ref 30.0–36.0)
MCV: 94.2 fL (ref 80.0–100.0)
Monocytes Absolute: 0.4 10*3/uL (ref 0.1–1.0)
Monocytes Relative: 4 %
Neutro Abs: 6.3 10*3/uL (ref 1.7–7.7)
Neutrophils Relative %: 64 %
Platelets: 206 10*3/uL (ref 150–400)
RBC: 4.3 MIL/uL (ref 3.87–5.11)
RDW: 14.5 % (ref 11.5–15.5)
WBC: 9.7 10*3/uL (ref 4.0–10.5)
nRBC: 0 % (ref 0.0–0.2)

## 2021-06-08 LAB — COMPREHENSIVE METABOLIC PANEL
ALT: 14 U/L (ref 0–44)
AST: 22 U/L (ref 15–41)
Albumin: 3.6 g/dL (ref 3.5–5.0)
Alkaline Phosphatase: 40 U/L (ref 38–126)
Anion gap: 24 — ABNORMAL HIGH (ref 5–15)
BUN: 43 mg/dL — ABNORMAL HIGH (ref 8–23)
CO2: 15 mmol/L — ABNORMAL LOW (ref 22–32)
Calcium: 11.6 mg/dL — ABNORMAL HIGH (ref 8.9–10.3)
Chloride: 110 mmol/L (ref 98–111)
Creatinine, Ser: 1.82 mg/dL — ABNORMAL HIGH (ref 0.44–1.00)
GFR, Estimated: 29 mL/min — ABNORMAL LOW (ref >60)
Glucose, Bld: 165 mg/dL — ABNORMAL HIGH (ref 70–99)
Potassium: 3.6 mmol/L (ref 3.5–5.1)
Sodium: 149 mmol/L — ABNORMAL HIGH (ref 135–145)
Total Bilirubin: 2 mg/dL — ABNORMAL HIGH (ref 0.3–1.2)
Total Protein: 6.4 g/dL — ABNORMAL LOW (ref 6.5–8.1)

## 2021-06-08 LAB — CBG MONITORING, ED: Glucose-Capillary: 156 mg/dL — ABNORMAL HIGH (ref 70–99)

## 2021-06-08 LAB — TYPE AND SCREEN
ABO/RH(D): O POS
Antibody Screen: NEGATIVE

## 2021-06-08 LAB — TROPONIN I (HIGH SENSITIVITY)
Troponin I (High Sensitivity): 96 ng/L — ABNORMAL HIGH (ref <18)
Troponin I (High Sensitivity): 101 ng/L (ref <18)

## 2021-06-08 LAB — PROTIME-INR
INR: 1.1 (ref 0.8–1.2)
Prothrombin Time: 14 seconds (ref 11.4–15.2)

## 2021-06-08 LAB — CK: Total CK: 13 U/L — ABNORMAL LOW (ref 38–234)

## 2021-06-08 LAB — PHOSPHORUS: Phosphorus: 1.6 mg/dL — ABNORMAL LOW (ref 2.5–4.6)

## 2021-06-08 LAB — RESP PANEL BY RT-PCR (FLU A&B, COVID) ARPGX2
Influenza A by PCR: NEGATIVE
Influenza B by PCR: NEGATIVE
SARS Coronavirus 2 by RT PCR: POSITIVE — AB

## 2021-06-08 LAB — LACTIC ACID, PLASMA
Lactic Acid, Venous: 6.4 mmol/L (ref 0.5–1.9)
Lactic Acid, Venous: 3.7 mmol/L (ref 0.5–1.9)

## 2021-06-08 LAB — APTT: aPTT: <20 seconds — ABNORMAL LOW (ref 24–36)

## 2021-06-08 LAB — TSH: TSH: 0.559 u[IU]/mL (ref 0.350–4.500)

## 2021-06-08 MED ORDER — SODIUM BICARBONATE 650 MG PO TABS
650.0000 mg | ORAL_TABLET | Freq: Two times a day (BID) | ORAL | Status: DC
  Administered 2021-06-11: 650 mg via ORAL
  Filled 2021-06-08 (×11): qty 1

## 2021-06-08 MED ORDER — LACTATED RINGERS IV BOLUS (SEPSIS): 250.0000 mL | Freq: Once | INTRAVENOUS | Status: DC

## 2021-06-08 MED ORDER — ACETAMINOPHEN 500 MG PO TABS
500.0000 mg | ORAL_TABLET | Freq: Four times a day (QID) | ORAL | Status: DC | PRN
  Filled 2021-06-08: qty 1

## 2021-06-08 MED ORDER — ACETAMINOPHEN 160 MG/5ML PO SOLN
500.0000 mg | ORAL | Status: DC | PRN
  Administered 2021-06-09: 500 mg via ORAL
  Filled 2021-06-08 (×2): qty 20.3

## 2021-06-08 MED ORDER — SODIUM CHLORIDE 0.9 % IV SOLN: 2.0000 g | Freq: Two times a day (BID) | INTRAVENOUS | Status: DC

## 2021-06-08 MED ORDER — VANCOMYCIN HCL IN DEXTROSE 1-5 GM/200ML-% IV SOLN: 1000.0000 mg | INTRAVENOUS | Status: DC

## 2021-06-08 MED ORDER — ONDANSETRON HCL 4 MG PO TABS: 4.0000 mg | ORAL_TABLET | Freq: Four times a day (QID) | ORAL | Status: DC | PRN

## 2021-06-08 MED ORDER — M.V.I. ADULT IV INJ
INTRAVENOUS | Status: DC
  Filled 2021-06-08 (×4): qty 1000

## 2021-06-08 MED ORDER — VANCOMYCIN HCL 1500 MG/300ML IV SOLN
1500.0000 mg | Freq: Once | INTRAVENOUS | Status: DC
Start: 2021-06-08 — End: 2021-06-08
  Administered 2021-06-08: 1500 mg via INTRAVENOUS
  Filled 2021-06-08: qty 300

## 2021-06-08 MED ORDER — LACTATED RINGERS IV SOLN: INTRAVENOUS | Status: DC

## 2021-06-08 MED ORDER — LACTATED RINGERS IV BOLUS (SEPSIS)
1000.0000 mL | Freq: Once | INTRAVENOUS | Status: AC
  Administered 2021-06-08: 1000 mL via INTRAVENOUS

## 2021-06-08 MED ORDER — ENOXAPARIN SODIUM 30 MG/0.3ML IJ SOSY
30.0000 mg | PREFILLED_SYRINGE | INTRAMUSCULAR | Status: DC
  Administered 2021-06-08: 30 mg via SUBCUTANEOUS
  Filled 2021-06-08: qty 0.3

## 2021-06-08 MED ORDER — FENTANYL CITRATE PF 50 MCG/ML IJ SOSY
12.5000 ug | PREFILLED_SYRINGE | INTRAMUSCULAR | Status: DC | PRN
  Filled 2021-06-08 (×2): qty 1

## 2021-06-08 MED ORDER — ONDANSETRON HCL 4 MG/2ML IJ SOLN
4.0000 mg | Freq: Four times a day (QID) | INTRAMUSCULAR | Status: DC | PRN
  Administered 2021-06-08: 4 mg via INTRAVENOUS
  Filled 2021-06-08: qty 2

## 2021-06-08 MED ORDER — METRONIDAZOLE 500 MG/100ML IV SOLN
500.0000 mg | Freq: Once | INTRAVENOUS | Status: AC
  Administered 2021-06-08: 500 mg via INTRAVENOUS
  Filled 2021-06-08: qty 100

## 2021-06-08 MED ORDER — VITAMIN D (ERGOCALCIFEROL) 1.25 MG (50000 UNIT) PO CAPS
50000.0000 [IU] | ORAL_CAPSULE | ORAL | Status: DC
  Filled 2021-06-08 (×3): qty 1

## 2021-06-08 MED ORDER — CYANOCOBALAMIN 1000 MCG/ML IJ SOLN: 1000.0000 ug | Freq: Once | INTRAMUSCULAR | Status: DC

## 2021-06-08 MED ORDER — VANCOMYCIN HCL IN DEXTROSE 1-5 GM/200ML-% IV SOLN: 1000.0000 mg | Freq: Once | INTRAVENOUS | Status: DC

## 2021-06-08 MED ORDER — SODIUM CHLORIDE 0.9 % IV SOLN
2.0000 g | Freq: Once | INTRAVENOUS | Status: AC
  Administered 2021-06-08: 2 g via INTRAVENOUS
  Filled 2021-06-08: qty 2

## 2021-06-08 MED ORDER — ACETAMINOPHEN 650 MG RE SUPP
650.0000 mg | RECTAL | Status: DC | PRN
  Administered 2021-06-09: 650 mg via RECTAL
  Filled 2021-06-08: qty 1

## 2021-06-08 MED ORDER — SEVELAMER CARBONATE 800 MG PO TABS
800.0000 mg | ORAL_TABLET | Freq: Three times a day (TID) | ORAL | Status: DC
  Filled 2021-06-08 (×5): qty 1

## 2021-06-08 NOTE — H&P (Addendum)
History and Physical    Amber [name] TIW:[id number] DOB: [birth year] DOA: 06/08/2021  PCP: Lindell Spar, MD (Confirm with patient/family/NH records and if not entered, this has to be entered at [facility] point of entry) Patient coming from: Home  I have personally briefly reviewed patient's old medical records in [place]  Chief Complaint: Leave me alone  HPI: Amber [name] is a 76 y.o. 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  Patient refused to provide any history, all history provided by patient daughter at bedside.  Daughter reported patient started to have swallowing problem for several weeks, " dry heaves" after eating solid food.  Patient also has had a episode of confusion and visual hallucination.  Patient reported that " feeling something stuck in the throat when swallowing solid food", choked x2.  This week, patient has refused to eat any solid food.  Mainly only drink water.  And patient was seen several times so meals into the trash.  And patient also complained "Has had enough and wanted to end her life".  Currently, patient denies any chest pain, no abdominal pain no urinary symptoms or diarrhea.  Denies any nauseous vomiting.  According to PCPs note, patient was worked up recently for elevated creatinine levels, renal ultrasound showed slight left-sided hydronephrosis.  Pelvis ultrasound showed mass, for which patient was referred to see outpatient OB/GYN.  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  And patient was started on vitamin D supplement however family reported patient has not been compliant with any of her pills.  PCP also tried Remeron and Ranexa, and patient has not been compliant either.  ED Course: Patient was  found to be hypotensive and tachycardia.  A total of 2 L of IV LR were given and blood pressure improved.  Blood work showed lactic acid 6.1, and this patient was started on broad-spectrum antibiotics vancomycin and cefepime.  Other blood work creatinine 1.8, sodium 149, calcium 11.6, albumin 3.4.  Review of Systems: Unable to perform, patient refused to answer ROS questions.   Past Medical History:  Diagnosis Date   Anxiety    Hypertension     No past surgical history on file.   reports that she has quit smoking. Her smoking use included cigarettes. She has never used smokeless tobacco. She reports that she does not currently use alcohol. She reports that she does not currently use drugs.  Allergies  Allergen Reactions   Shellfish Allergy     "I pass out"    Family History  Problem Relation Age of Onset   High blood pressure Maternal Grandmother      Prior to Admission medications   Medication Sig Start Date End Date Taking? Authorizing Provider  acetaminophen (TYLENOL) 500 MG tablet Take 500 mg by mouth every 6 (six) hours as needed for moderate pain or mild pain.   Yes [provider]  famotidine (PEPCID) 20 MG tablet Take 1 tablet (20 mg total) by mouth 2 (two) times daily. 04/25/21  Yes Lindell Spar, MD  folic acid (FOLVITE) 1 MG tablet Take 1 mg by mouth daily.   Yes [provider]  mirtazapine (REMERON) 7.5 MG tablet Take 1 tablet (7.5 mg total) by mouth at bedtime. 03/29/21  Yes Lindell Spar, MD  ondansetron [facility]) 4  MG tablet Take 1 tablet (4 mg total) by mouth every 8 (eight) hours as needed for nausea or vomiting. 04/25/21  Yes Lindell Spar, MD  vitamin B-12 (CYANOCOBALAMIN) 500 MCG tablet Take 500 mcg by mouth daily.   Yes [provider]  Vitamin D, Ergocalciferol, (DRISDOL) 1.25 MG (50000 UNIT) CAPS capsule Take 1 capsule (50,000 Units total) by mouth every 7 (seven) days. 04/02/21  Yes Lindell Spar, MD    Physical  Exam: Vitals:   06/08/21 1610 06/08/21 1645 06/08/21 1700 06/08/21 1845  BP:  (!) 144/91 (!) 159/93 133/89  Pulse:   (!) 116   Resp:  (!) 24 (!) 25 16  Temp: 97.9 F (36.6 C)     TempSrc: Rectal     SpO2:   93%     Constitutional: NAD, calm, comfortable Vitals:   06/08/21 1610 06/08/21 1645 06/08/21 1700 06/08/21 1845  BP:  (!) 144/91 (!) 159/93 133/89  Pulse:   (!) 116   Resp:  (!) 24 (!) 25 16  Temp: 97.9 F (36.6 C)     TempSrc: Rectal     SpO2:   93%    Eyes: PERRL, lids and conjunctivae normal ENMT: Mucous membranes are dry. Posterior pharynx clear of any exudate or lesions.Normal dentition.  Neck: normal, supple, no masses, no thyromegaly Respiratory: clear to auscultation bilaterally, no wheezing, no crackles. Normal respiratory effort. No accessory muscle use.  Cardiovascular: Regular rate and rhythm, no murmurs / rubs / gallops. No extremity edema. 2+ pedal pulses. No carotid bruits.  Abdomen: no tenderness, no masses palpated. No hepatosplenomegaly. Bowel sounds positive.  Musculoskeletal: no clubbing / cyanosis. No joint deformity upper and lower extremities. Good ROM, no contractures. Normal muscle tone.  Skin: no rashes, lesions, ulcers. No induration Neurologic: CN 2-12 grossly intact. Sensation intact, DTR normal. Strength 5/5 in all 4.  Psychiatric: Normal judgment and insight. Alert and oriented x 3. Normal mood.    Labs on Admission: I have personally reviewed following labs and imaging studies  CBC: Recent Labs  Lab 06/08/21 1548  WBC 9.7  NEUTROABS 6.3  HGB 12.7  HCT 40.5  MCV 94.2  PLT 505   Basic Metabolic Panel: Recent Labs  Lab 06/08/21 1548  NA 149*  K 3.6  CL 110  CO2 15*  GLUCOSE 165*  BUN 43*  CREATININE 1.82*  CALCIUM 11.6*   GFR: CrCl cannot be calculated (Unknown ideal weight.). Liver Function Tests: Recent Labs  Lab 06/08/21 1548  AST 22  ALT 14  ALKPHOS 40  BILITOT 2.0*  PROT 6.4*  ALBUMIN 3.6   No results for  input(s): LIPASE, AMYLASE in the last 168 hours. No results for input(s): AMMONIA in the last 168 hours. Coagulation Profile: Recent Labs  Lab 06/08/21 1529  INR 1.1   Cardiac Enzymes: No results for input(s): CKTOTAL, CKMB, CKMBINDEX, TROPONINI in the last 168 hours. BNP (last 3 results) No results for input(s): PROBNP in the last 8760 hours. HbA1C: No results for input(s): HGBA1C in the last 72 hours. CBG: Recent Labs  Lab 06/08/21 1545  GLUCAP 156*   Lipid Profile: No results for input(s): CHOL, HDL, LDLCALC, TRIG, CHOLHDL, LDLDIRECT in the last 72 hours. Thyroid Function Tests: No results for input(s): TSH, T4TOTAL, FREET4, T3FREE, THYROIDAB in the last 72 hours. Anemia Panel: No results for input(s): VITAMINB12, FOLATE, FERRITIN, TIBC, IRON, RETICCTPCT in the last 72 hours. Urine analysis:    Component Value Date/Time   COLORURINE YELLOW 04/24/2021 1254  APPEARANCEUR HAZY (A) 04/24/2021 1254   APPEARANCEUR Cloudy (A) 03/30/2021 1431   LABSPEC >1.030 (H) 04/24/2021 1254   PHURINE 5.0 04/24/2021 1254   GLUCOSEU NEGATIVE 04/24/2021 1254   HGBUR LARGE (A) 04/24/2021 1254   BILIRUBINUR MODERATE (A) 04/24/2021 1254   BILIRUBINUR Negative 03/30/2021 1431   KETONESUR 15 (A) 04/24/2021 1254   PROTEINUR 100 (A) 04/24/2021 1254   NITRITE NEGATIVE 04/24/2021 1254   LEUKOCYTESUR NEGATIVE 04/24/2021 1254    Radiological Exams on Admission: CT Head Wo Contrast  Result Date: 06/08/2021 CLINICAL DATA:  Mental status change EXAM: CT HEAD WITHOUT CONTRAST TECHNIQUE: Contiguous axial images were obtained from the base of the skull through the vertex without intravenous contrast. RADIATION DOSE REDUCTION: This exam was performed according to the departmental dose-optimization program which includes automated exposure control, adjustment of the mA and/or kV according to patient size and/or use of iterative reconstruction technique. COMPARISON:  None. FINDINGS: Brain: No acute  intracranial hemorrhage. No focal mass lesion. No CT evidence of acute infarction. No midline shift or mass effect. No hydrocephalus. Basilar cisterns are patent. There are periventricular and subcortical white matter hypodensities. Generalized cortical atrophy. Vascular: No hyperdense vessel or unexpected calcification. Skull: Normal. Negative for fracture or focal lesion. Sinuses/Orbits: Paranasal sinuses and mastoid air cells are clear. Orbits are clear. Other: None. IMPRESSION: No acute intracranial findings. Chronic atrophy and white matter microvascular disease Electronically Signed   By: Suzy Bouchard M.D.   On: 06/08/2021 17:43   DG Chest Portable 1 View  Result Date: 06/08/2021 CLINICAL DATA:  Hypotension.  Decreased oral intake. EXAM: PORTABLE CHEST 1 VIEW COMPARISON:  Chest radiograph dated April 24, 2021. FINDINGS: The heart size and mediastinal contours are within normal limits. Both lungs are clear. The visualized skeletal structures are unremarkable. IMPRESSION: No active disease. Electronically Signed   By: Keane Police D.O.   On: 06/08/2021 17:23    EKG: Independently reviewed.  Sinus tachycardia, no acute ST changes.  Assessment/Plan Principal Problem:   Hypernatremia Active Problems:   Failure to thrive in adult  (please populate well all problems here in Problem List. (For example, if patient is on BP meds at home and you resume or decide to hold them, it is a problem that needs to be her. Same for CAD, COPD, HLD and so on)  Failure to thrive -Suspect underlying worsening of dysphagia -Continue IV fluid -Consult speech evaluation. -PT evaluation, may need placement.  Hypernatremia -Secondary from dehydration severe, change IV fluid to D5 half-normal saline with multivitamins. -Repeat BMP tomorrow.  Depression -Denied any suicidal ideation today, will consult psychiatry.  Discussed with patient family at bedside. 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  She also has a chronic hypercalcemia but corrected calcium level less than 14, less likely to cause her mentation changes.  Dysphagia, subacute, worsening -Solid food only -Consult speech evaluation. -Liquid diet for now.  Secondary hyperparathyroidism -Likely from vitamin D deficiency secondary to CKD -Continue weekly vitamin D2 -Also suspect hyper phosphorus, will start phosphorus binder.  Hypercalcemia -Less likely to cause patient mentation changes -Secondary to hyperparathyroidism -Continue vitamin D supplement, add phosphate binder.  Combined anion gap and non-anion gap metabolic acidosis -Anion gap conform lactate with secondary to severe dehydration and poor perfusion.  Low suspicion for sepsis, hold off antibiotics.  UA pending. -For the nongap Metabolic acidosis, will start patient on p.o. bicarb pills.  Elevated troponins -No chest pains, EKG showed tachycardia  sinus. -Suspect demand ischemia from persistent tachycardia, worsening of dehydration and hypovolemia.  We will do aggressive hydration and reevaluate. -TSH was checked and resulted within normal limits. -We will check echocardiogram.  CKD stage IIIb -Renal ultrasound 2 months ago showed bilateral nonobstructing kidney stones, will recheck renal ultrasound.  Lactic acidosis -Likely from severe dehydration/hypovolemia, will recheck after aggressive hydration.  Monitor off antibiotics.  DVT prophylaxis: Lovenox Code Status: Full code Family Communication: Daughter at bedside Disposition Plan: Expect more than 2 midnight hospital stay given the overall complexity of medical problems Consults called: Psychiatry Admission status: MedSurg admission   Lequita Halt MD [facility] Pager [phone]  06/08/2021, 7:03 PM

## 2021-06-08 NOTE — ED Notes (Signed)
Lactic 6.4. MD Rogene Houston made aware

## 2021-06-08 NOTE — Sepsis Progress Note (Signed)
Notified bedside nurse of need to draw repeat lactic acid. 

## 2021-06-08 NOTE — ED Triage Notes (Signed)
Pt BIB POV d/t failure to thrive. Per pt's family, pt has been slowly declining over the past 2 weeks d/t decreased oral intake. Upon ED arrival, pt's BP was reading at 50/27. Pt's daughter stated that the pt told her that she's doing this to speed up her death. However, today, pt stated that she does want to get help and get better.

## 2021-06-08 NOTE — ED Notes (Signed)
Was only able to obtain 1 set of blood culture on this pt. MD Zackowski made aware.

## 2021-06-08 NOTE — ED Provider Notes (Signed)
Ill appearing, tachycardic, hypotensive. Answers questions and breathing spontaneously. Will move to room now. 3:46 PM    Tedd Sias, PA 06/08/21 1546    Fredia Sorrow, MD 06/14/21 1027

## 2021-06-08 NOTE — ED Provider Notes (Addendum)
[facility] EMERGENCY DEPARTMENT Provider Note   CSN: [id number] Arrival date & time: 06/08/21  1529     History  No chief complaint on file.   Amber [name] is a 76 y.o. female.  Patient brought in by POV.  Her daughter is with her.  Apparently there is been a significant change in her in the past week.  Where she does not want to eat or drink.  Actually will throw food into the trash can.  Stated to family that she is ready to die.  Patient does not have a known chronic terminal illness.  History of hypertension and anxiety.  Family also thinks there may be a dementia component.  Where she is seeing things.  Evaluated this week by neurology Dr. Hall Busing.  Family brought her in because concerns for her not eating or drinking.  Out in triage her blood pressure was 50 systolic.  When patient brought back to the room we got 95 systolic.  The patient is tachycardic and tachypneic and temperature was 97 so initiated sepsis protocol based on the vital signs.      Home Medications Prior to Admission medications   Medication Sig Start Date End Date Taking? Authorizing Provider  famotidine (PEPCID) 20 MG tablet Take 1 tablet (20 mg total) by mouth 2 (two) times daily. Patient not taking: Reported on 06/07/2021 04/25/21   Lindell Spar, MD  folic acid (FOLVITE) 1 MG tablet Take 1 mg by mouth daily. Patient not taking: Reported on 06/07/2021    [provider]  mirtazapine (REMERON) 7.5 MG tablet Take 1 tablet (7.5 mg total) by mouth at bedtime. Patient not taking: Reported on 06/07/2021 03/29/21   Lindell Spar, MD  ondansetron (ZOFRAN) 4 MG tablet Take 1 tablet (4 mg total) by mouth every 8 (eight) hours as needed for nausea or vomiting. Patient not taking: Reported on 06/07/2021 04/25/21   Lindell Spar, MD  vitamin B-12 (CYANOCOBALAMIN) 500 MCG tablet Take 500 mcg by mouth daily. Patient not taking: Reported on 06/07/2021    [provider]  Vitamin D,  Ergocalciferol, (DRISDOL) 1.25 MG (50000 UNIT) CAPS capsule Take 1 capsule (50,000 Units total) by mouth every 7 (seven) days. Patient not taking: Reported on 06/07/2021 04/02/21   Lindell Spar, MD      Allergies    Shellfish allergy    Review of Systems   Review of Systems  Unable to perform ROS: Mental status change   Physical Exam Updated Vital Signs BP 95/78    Pulse (!) 127    Temp 97.9 F (36.6 C) (Rectal)    Resp (!) 22    SpO2 100%  Physical Exam Vitals and nursing note reviewed.  Constitutional:      General: She is not in acute distress.    Appearance: Normal appearance. She is well-developed. She is ill-appearing.  HENT:     Head: Normocephalic and atraumatic.     Mouth/Throat:     Mouth: Mucous membranes are dry.  Eyes:     Conjunctiva/sclera: Conjunctivae normal.  Cardiovascular:     Rate and Rhythm: Normal rate and regular rhythm.     Heart sounds: No murmur heard. Pulmonary:     Effort: Pulmonary effort is normal. No respiratory distress.     Breath sounds: Normal breath sounds.  Abdominal:     General: There is no distension.     Palpations: Abdomen is soft.     Tenderness: There is no  abdominal tenderness. There is no guarding.  Musculoskeletal:        General: No swelling.     Cervical back: Neck supple. No rigidity.     Right lower leg: No edema.     Left lower leg: No edema.  Skin:    General: Skin is warm and dry.     Capillary Refill: Capillary refill takes 2 to 3 seconds.  Neurological:     Comments: Patient awake.  Patient not verbalizing.  Not following commands.  Not sure whether this is by choice.  Psychiatric:        Mood and Affect: Mood normal.    ED Results / Procedures / Treatments   Labs (all labs ordered are listed, but only abnormal results are displayed) Labs Reviewed  CBC WITH DIFFERENTIAL/PLATELET - Abnormal; Notable for the following components:      Result Value   Abs Immature Granulocytes 0.10 (*)    All other  components within normal limits  COMPREHENSIVE METABOLIC PANEL - Abnormal; Notable for the following components:   Sodium 149 (*)    CO2 15 (*)    Glucose, Bld 165 (*)    BUN 43 (*)    Creatinine, Ser 1.82 (*)    Calcium 11.6 (*)    Total Protein 6.4 (*)    Total Bilirubin 2.0 (*)    GFR, Estimated 29 (*)    Anion gap 24 (*)    All other components within normal limits  CBG MONITORING, ED - Abnormal; Notable for the following components:   Glucose-Capillary 156 (*)    All other components within normal limits  TROPONIN I (HIGH SENSITIVITY) - Abnormal; Notable for the following components:   Troponin I (High Sensitivity) 96 (*)    All other components within normal limits  CULTURE, BLOOD (ROUTINE X 2)  CULTURE, BLOOD (ROUTINE X 2)  RESP PANEL BY RT-PCR (FLU A&B, COVID) ARPGX2  URINE CULTURE  URINALYSIS, ROUTINE W REFLEX MICROSCOPIC  LACTIC ACID, PLASMA  LACTIC ACID, PLASMA  PROTIME-INR  APTT  TYPE AND SCREEN    EKG EKG Interpretation  Date/Time:  Friday June 08 2021 15:54:14 EST Ventricular Rate:  135 PR Interval:  128 QRS Duration: 124 QT Interval:  310 QTC Calculation: 465 R Axis:   -72 Text Interpretation: Sinus tachycardia Consider right atrial enlargement Left bundle branch block No significant change since last tracing Confirmed by Fredia Sorrow [phone]) on 06/08/2021 4:06:39 PM  Radiology No results found.  Procedures Procedures    Medications Ordered in ED Medications  lactated ringers infusion (has no administration in time range)  lactated ringers bolus 1,000 mL (has no administration in time range)    And  lactated ringers bolus 1,000 mL (has no administration in time range)    And  lactated ringers bolus 250 mL (has no administration in time range)  ceFEPIme (MAXIPIME) 2 g in sodium chloride 0.9 % 100 mL IVPB (has no administration in time range)  metroNIDAZOLE (FLAGYL) IVPB 500 mg (has no administration in time range)  vancomycin  (VANCOREADY) IVPB 1500 mg/300 mL (has no administration in time range)    ED Course/ Medical Decision Making/ A&P                           Medical Decision Making  CRITICAL CARE Performed by: Fredia Sorrow Total critical care time: 60 minutes Critical care time was exclusive of separately billable procedures and treating other patients. Critical care  was necessary to treat or prevent imminent or life-threatening deterioration. Critical care was time spent personally by me on the following activities: development of treatment plan with patient and/or surrogate as well as nursing, discussions with consultants, evaluation of patient's response to treatment, examination of patient, obtaining history from patient or surrogate, ordering and performing treatments and interventions, ordering and review of laboratory studies, ordering and review of radiographic studies, pulse oximetry and re-evaluation of patient's condition.  Patient with vital signs that trigger sepsis parameters.  Patient with a low temperature tachycardic low blood pressure.  Initially 50 systolic repeat was 95 systolic.  Patient started on the 30 cc/kg bolus fluids and started on broad-spectrum antibiotics.  As labs come in lactic acid still pending but no leukocytosis.  Chest x-ray negative.  Urinalysis still pending.  Patient's electrolytes more suggestive of acute kidney injury and dehydration so may not be true sepsis.  Head CT was negative.  Patient's initial troponin was 96.  Delta troponins will be done to flush out any concerns for acute cardiac event.  Sodium was markedly elevated at 149.  Consistent with a dehydration picture  TSH is pending.  Lactic acid is pending.  Blood cultures have been done.  Patient's blood pressure is improving with the fluids.  Were now getting systolics in the 259D.  Patient is now talking.  More alert.  Family knows patient will get admitted for the acute kidney injury and the hypernatremia.   Fluids will help correct that as well.  They are also concerned about the behavioral health issues and her refusing to eat.  Patient apparently did a few days ago voiced that she just wanted to die.  Will discuss all this with admitting team.  Patient's primary care doctor is Dr. Posey Pronto in [place].  So probably will be an unassigned medicine admission.   Also chart review shows the patient was seen the end of November for dementia and behavioral concerns.  Patient had MRI brain and CT venogram without any acute findings.  Final Clinical Impression(s) / ED Diagnoses Final diagnoses:  Failure to thrive in adult  Sepsis, due to unspecified organism, unspecified whether acute organ dysfunction present ([place])  Hypernatremia  AKI (acute kidney injury) [facility])    Rx / DC Orders ED Discharge Orders     None         Fredia Sorrow, MD 06/08/21 1800    Fredia Sorrow, MD 06/08/21 Johnnye Lana    Fredia Sorrow, MD 06/08/21 1803   Addendum: Lactic acid is back it is 6.4.  Still raises some concerns about possible sepsis.  Patient was treated with broad-spectrum antibiotics.  No significant leukocytosis.  Discussed with hospitalist they will admit for the acute kidney injury and hypernatremia.  And will continue to evaluate for possible sepsis.  Urinalysis still pending.  Blood cultures pending.   Fredia Sorrow, MD 06/08/21 [phone]

## 2021-06-08 NOTE — Sepsis Progress Note (Signed)
eLink is monitoring this Code Sepsis. °

## 2021-06-08 NOTE — Progress Notes (Signed)
Pharmacy Antibiotic Note  Amber [name] is a 76 y.o. female admitted on 06/08/2021 with sepsis.  Pharmacy has been consulted for vancomycin and cefepime dosing.  WBC within normal limits at 9.7, patient remains afebrile. HR 116 and RR 25. Scr 1.82 today, elevated from last November. Per patient report, weight 157 lbs.   Plan: Cefepime 2g q 12h Vancomycin 1500 mg IV x 1 Vancomycin 1000 mg IV q 36h (estimated AUC 487) Metronidazole per MD Monitor renal function and signs of clinical improvement Follow-up cultures    Temp (24hrs), Avg:97.5 F (36.4 C), Min:97 F (36.1 C), Max:97.9 F (36.6 C)  Recent Labs  Lab 06/08/21 1548  WBC 9.7    CrCl cannot be calculated (Patient's most recent lab result is older than the maximum 21 days allowed.).    Allergies  Allergen Reactions   Shellfish Allergy     "I pass out"    Antimicrobials this admission: 1/13 cefepime >>  1/13 vancomycin >>  1/13 metronidazole >>  Dose adjustments this admission: none  Microbiology results: 1/13 BCx: pending  Thank you for involving pharmacy in this patient's care.  Elita Quick, PharmD PGY1 Ambulatory Care Pharmacy Resident 06/08/2021 6:28 PM  **Pharmacist phone directory can be found on [email or web address] listed under [facility]**

## 2021-06-09 ENCOUNTER — Inpatient Hospital Stay (HOSPITAL_COMMUNITY): Payer: Medicare Other

## 2021-06-09 DIAGNOSIS — F332 Major depressive disorder, recurrent severe without psychotic features: Secondary | ICD-10-CM

## 2021-06-09 DIAGNOSIS — R778 Other specified abnormalities of plasma proteins: Secondary | ICD-10-CM

## 2021-06-09 DIAGNOSIS — I248 Other forms of acute ischemic heart disease: Secondary | ICD-10-CM

## 2021-06-09 LAB — URINALYSIS, ROUTINE W REFLEX MICROSCOPIC
Bilirubin Urine: NEGATIVE
Glucose, UA: NEGATIVE mg/dL
Ketones, ur: NEGATIVE mg/dL
Nitrite: NEGATIVE
Protein, ur: NEGATIVE mg/dL
Specific Gravity, Urine: 1.025 (ref 1.005–1.030)
pH: 5.5 (ref 5.0–8.0)

## 2021-06-09 LAB — BLOOD GAS, VENOUS
Acid-Base Excess: 2.5 mmol/L — ABNORMAL HIGH (ref 0.0–2.0)
Bicarbonate: 26.4 mmol/L (ref 20.0–28.0)
Drawn by: 1390
O2 Saturation: 68 %
Patient temperature: 36.4
pCO2, Ven: 38.5 mmHg — ABNORMAL LOW (ref 44.0–60.0)
pH, Ven: 7.447 — ABNORMAL HIGH (ref 7.250–7.430)
pO2, Ven: 37.3 mmHg (ref 32.0–45.0)

## 2021-06-09 LAB — BASIC METABOLIC PANEL
Anion gap: 6 (ref 5–15)
BUN: 39 mg/dL — ABNORMAL HIGH (ref 8–23)
CO2: 25 mmol/L (ref 22–32)
Calcium: 10 mg/dL (ref 8.9–10.3)
Chloride: 112 mmol/L — ABNORMAL HIGH (ref 98–111)
Creatinine, Ser: 1.43 mg/dL — ABNORMAL HIGH (ref 0.44–1.00)
GFR, Estimated: 39 mL/min — ABNORMAL LOW (ref >60)
Glucose, Bld: 126 mg/dL — ABNORMAL HIGH (ref 70–99)
Potassium: 3 mmol/L — ABNORMAL LOW (ref 3.5–5.1)
Sodium: 143 mmol/L (ref 135–145)

## 2021-06-09 LAB — ECHOCARDIOGRAM LIMITED
Area-P 1/2: 3.08 cm2
Height: 66 in
Single Plane A2C EF: 41.3 %
Weight: 2518.54 oz

## 2021-06-09 LAB — CBC
HCT: 29.7 % — ABNORMAL LOW (ref 36.0–46.0)
Hemoglobin: 9.9 g/dL — ABNORMAL LOW (ref 12.0–15.0)
MCH: 30.3 pg (ref 26.0–34.0)
MCHC: 33.3 g/dL (ref 30.0–36.0)
MCV: 90.8 fL (ref 80.0–100.0)
Platelets: 120 10*3/uL — ABNORMAL LOW (ref 150–400)
RBC: 3.27 MIL/uL — ABNORMAL LOW (ref 3.87–5.11)
RDW: 14.4 % (ref 11.5–15.5)
WBC: 8.6 10*3/uL (ref 4.0–10.5)
nRBC: 0 % (ref 0.0–0.2)

## 2021-06-09 LAB — URINALYSIS, MICROSCOPIC (REFLEX): RBC / HPF: >50 RBC/hpf (ref 0–5)

## 2021-06-09 LAB — PROCALCITONIN: Procalcitonin: 0.16 ng/mL

## 2021-06-09 LAB — TSH: TSH: 0.545 u[IU]/mL (ref 0.350–4.500)

## 2021-06-09 LAB — C-REACTIVE PROTEIN: CRP: 0.6 mg/dL (ref <1.0)

## 2021-06-09 LAB — D-DIMER, QUANTITATIVE: D-Dimer, Quant: 3.2 ug/mL-FEU — ABNORMAL HIGH (ref 0.00–0.50)

## 2021-06-09 MED ORDER — POTASSIUM CHLORIDE 10 MEQ/100ML IV SOLN
10.0000 meq | INTRAVENOUS | Status: AC
  Administered 2021-06-09 (×4): 10 meq via INTRAVENOUS
  Filled 2021-06-09 (×4): qty 100

## 2021-06-09 MED ORDER — MIRTAZAPINE 15 MG PO TBDP
15.0000 mg | ORAL_TABLET | Freq: Every day | ORAL | Status: DC
  Administered 2021-06-11 – 2021-06-12 (×2): 15 mg via ORAL
  Filled 2021-06-09 (×7): qty 1

## 2021-06-09 MED ORDER — SODIUM CHLORIDE 0.9 % IV SOLN
100.0000 mg | Freq: Every day | INTRAVENOUS | Status: AC
  Administered 2021-06-10 – 2021-06-11 (×2): 100 mg via INTRAVENOUS
  Filled 2021-06-09 (×2): qty 20

## 2021-06-09 MED ORDER — MIRTAZAPINE 15 MG PO TBDP: 15.0000 mg | ORAL_TABLET | Freq: Every day | ORAL | Status: DC

## 2021-06-09 MED ORDER — ENOXAPARIN SODIUM 40 MG/0.4ML IJ SOSY
40.0000 mg | PREFILLED_SYRINGE | INTRAMUSCULAR | Status: DC
  Administered 2021-06-09: 40 mg via SUBCUTANEOUS
  Filled 2021-06-09: qty 0.4

## 2021-06-09 MED ORDER — DEXTROSE-NACL 5-0.9 % IV SOLN: INTRAVENOUS | Status: DC

## 2021-06-09 MED ORDER — SODIUM CHLORIDE 0.9 % IV SOLN
200.0000 mg | Freq: Once | INTRAVENOUS | Status: AC
  Administered 2021-06-09: 200 mg via INTRAVENOUS
  Filled 2021-06-09: qty 40

## 2021-06-09 MED ORDER — THIAMINE HCL 100 MG PO TABS
100.0000 mg | ORAL_TABLET | Freq: Three times a day (TID) | ORAL | Status: DC
  Filled 2021-06-09 (×5): qty 1

## 2021-06-09 MED ORDER — THIAMINE HCL 100 MG/ML IJ SOLN
400.0000 mg | Freq: Three times a day (TID) | INTRAMUSCULAR | Status: AC
  Administered 2021-06-09 – 2021-06-12 (×9): 400 mg via INTRAVENOUS
  Filled 2021-06-09 (×9): qty 4

## 2021-06-09 NOTE — Progress Notes (Signed)
°  Echocardiogram 2D Echocardiogram has been performed.  Amber [name] 06/09/2021, 1:50 PM

## 2021-06-09 NOTE — Evaluation (Signed)
Clinical/Bedside Swallow Evaluation Patient Details  Name: Amber [name] MRN: [id number] Date of Birth: 1945/09/11  Today's Date: 06/09/2021 Time: SLP Start Time (ACUTE ONLY): 1015 SLP Stop Time (ACUTE ONLY): 1045 SLP Time Calculation (min) (ACUTE ONLY): 30 min  Past Medical History:  Past Medical History:  Diagnosis Date   Anxiety    Hypertension    Past Surgical History: No past surgical history on file. HPI:  Amber [name], 76 y.o. 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  As per daughter patient started having swallowing problem for several weeks with dry heaves after eating solid food, episodes of confusion with hallucination, and complaining of feeling something stuck in the throat when swallowing solid food.  Head CT and CXR were negative for acute abnormalities.    Assessment / Plan / Recommendation  Clinical Impression  Pt was seen for a bedside swallow evaluation in the setting of reported PO refusal and difficulty consuming liquids and solids over the past few weeks.  Pt's daughter was present at bedside and able to provide a hx for the pt.  Daughter stated that the pt began refusing food/drinks a few weeks ago, but that she is able to drink without overt coughing or choking if she is distracted.  Daughter was unaware of a choking incident or any other incident that may have led to PO refusal other than intermittent stomach pain.  She did report however, that the pt has had a few hallucinations in the past few weeks and that she appeared to be anxious/depressed.  Pt has been refusing medications at home as well as food/drinks despite family efforts.  Oral mechanism exam was [facility].  Pt allowed SLP to complete brief oral care, and she accepted a small ice chip into her oral cavity before expelling it via lingual protrusion.  She accepted thin liquid passively via siphoned straw, but  allowed most of it to spill anteriorly out of her oral cavity on the right side.  Swallow initiation of some of the bolus was appreciated via palpation, and no overt s/sx of aspiration were observed.  Hyolaryngeal elevation and excursion were also observed with cued swallow of saliva.  Pt refused all additional PO trials.  Suspect functional oropharyngeal swallowing abilities; however, difficult to assess given limited trials.  Pt may benefit from palliative care consult to discuss the possibility of alternative means of nutrition with pt/family if she continues to refuse PO and oral medications.  Recommend continuation of full liquid diet so that pt has the opportunity and option to consume PO at each meal time.  SLP Visit Diagnosis: Dysphagia, unspecified (R13.10)    Aspiration Risk  Risk for inadequate nutrition/hydration    Diet Recommendation Thin liquid   Liquid Administration via: Cup;Straw Medication Administration: Crushed with puree Supervision: Staff to assist with self feeding    Other  Recommendations Oral Care Recommendations: Oral care BID    Recommendations for follow up therapy are one component of a multi-disciplinary discharge planning process, led by the attending physician.  Recommendations may be updated based on patient status, additional functional criteria and insurance authorization.  Follow up Recommendations Follow physician's recommendations for discharge plan and follow up therapies      Assistance Recommended at Discharge Frequent or constant Supervision/Assistance  Functional Status Assessment Patient has had a recent decline in their functional status and/or demonstrates limited ability to make significant improvements in function in a  reasonable and predictable amount of time  Frequency and Duration min 2x/week  2 weeks       Prognosis Prognosis for Safe Diet Advancement: Fair Barriers to Reach Goals: Motivation      Swallow Study   General HPI:  Amber [name], 76 y.o. 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  As per daughter patient started having swallowing problem for several weeks with dry heaves after eating solid food, episodes of confusion with hallucination, and complaining of feeling something stuck in the throat when swallowing solid food.  Head CT and CXR were negative for acute abnormalities. Type of Study: Bedside Swallow Evaluation Previous Swallow Assessment: N/A Diet Prior to this Study: Thin liquids Temperature Spikes Noted: No Respiratory Status: Room air History of Recent Intubation: No Behavior/Cognition: Alert;Confused Oral Cavity Assessment: Dry Oral Care Completed by SLP: Yes Oral Cavity - Dentition: Other (Comment) (Unable to fully assess) Vision: Functional for self-feeding Self-Feeding Abilities: Refused PO Patient Positioning: Upright in bed Baseline Vocal Quality: Normal Volitional Swallow: Able to elicit    Oral/Motor/Sensory Function Overall Oral Motor/Sensory Function: Within functional limits   Ice Chips Ice chips: Impaired Presentation: Spoon Other Comments: expelled via lingual protrusion   Thin Liquid Thin Liquid: Impaired Presentation: Straw Oral Phase Impairments: Reduced labial seal Oral Phase Functional Implications: Right anterior spillage Other Comments: mostly allowed liquid to spill anteriorly    Nectar Thick Nectar Thick Liquid: Not tested   Honey Thick Honey Thick Liquid: Not tested   Puree Puree: Not tested   Solid     Solid: Not tested     Bretta Bang, M.S., [facility] Office: [phone]  Elvia Collum Jermine Bibbee 06/09/2021,11:30 AM

## 2021-06-09 NOTE — Progress Notes (Signed)
PT Cancellation Note  Patient Details Name: AUBRIONNA ISTRE MRN: [id number] DOB: 1945/08/08   Cancelled Treatment:    Reason Eval/Treat Not Completed: Patient declined, no reason specified Pt adamantly declining PT assessment; pt daughter present and aware.  Wyona Almas, PT, DPT Acute Rehabilitation Services Pager [phone] Office [phone]    Deno Etienne 06/09/2021, 12:54 PM

## 2021-06-09 NOTE — Consult Note (Addendum)
[facility] Face-to-Face Psychiatry Consult   Reason for Consult:  Depression Referring Physician:  Wynetta Fines, MD Patient Identification: Amber [name] MRN:  [id number] Principal Diagnosis: Major depressive disorder, recurrent episode, severe ([place]) Diagnosis:  Principal Problem:   Major depressive disorder, recurrent episode, severe ([place]) Active Problems:   Failure to thrive in adult   Hypernatremia   Total Time spent with patient: 1 hour  Subjective:   Amber [name] is a 76 y.o. female patient admitted with failure to thrive.  HPI:  76 y.o. 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 Patient states that she feels too weak to participate fully with psychiatric assessment but givers permission to obtain collateral information from her [place] who is at her bedside. Daughter reports that patient has past history of Major Depression for which she took anti-depressant many years ago. However, she reports that patient has been getting more depressed in the last few weeks and was placed on Remeron which she stopped taking after she compliant of difficulty swallowing.Daughter reports that patient has not been eating in the last few weeks and she is already losing weight. She has observed worsening depression characterized by hopelessness, low energy level, fatigue, social withdrawal, poor appetite, passive suicidal thoughts and insomnia. Per daughter, has been having delusions and visual hallucinations at times. Patient is uncooperative, refuses to answer most questions and pleaded to be left alone.Per daughter, patient had multiple medical and neurological work up recently as documented in patient chart.  Past Psychiatric History: as above  Risk to Self:  unable to assess, patient uncooperative Risk to Others:  unable to assess, patient  uncooperative Prior Inpatient Therapy:  None in the past Prior Outpatient Therapy:  PCP  Past Medical History:  Past Medical History:  Diagnosis Date   Anxiety    Hypertension    No past surgical history on file. Family History:  Family History  Problem Relation Age of Onset   High blood pressure Maternal Grandmother    Family Psychiatric  History:  Social History:  Social History   Substance and Sexual Activity  Alcohol Use Not Currently     Social History   Substance and Sexual Activity  Drug Use Not Currently    Social History   Socioeconomic History   Marital status: Married    Spouse name: Not on file   Number of children: Not on file   Years of education: Not on file   Highest education level: Not on file  Occupational History   Not on file  Tobacco Use   Smoking status: Former    Types: Cigarettes   Smokeless tobacco: Never  Substance and Sexual Activity   Alcohol use: Not Currently   Drug use: Not Currently   Sexual activity: Not Currently  Other Topics Concern   Not on file  Social History Narrative   Right handed    one story home    lives with family    Doesn't work currently   Social Determinants of Radio broadcast assistant Strain: Not on file  Food Insecurity: Not on file  Transportation Needs: Not on file  Physical Activity: Not on file  Stress: Not on file  Social Connections: Not on file   Additional Social History:    Allergies:   Allergies  Allergen Reactions   Shellfish Allergy     "I pass out"  Labs:  Results for orders placed or performed during the hospital encounter of 06/08/21 (from the past 48 hour(s))  Protime-INR     Status: None   Collection Time: 06/08/21  3:29 PM  Result Value Ref Range   Prothrombin Time 14.0 11.4 - 15.2 seconds   INR 1.1 0.8 - 1.2    Comment: (NOTE) INR goal varies based on device and disease states. Performed at [facility], [place] [address], [place], [place] [address]    APTT     Status: Abnormal   Collection Time: 06/08/21  3:29 PM  Result Value Ref Range   aPTT <20 (L) 24 - 36 seconds    Comment: Performed at [facility] [address], [place], [place] [address]  CBG monitoring, ED     Status: Abnormal   Collection Time: 06/08/21  3:45 PM  Result Value Ref Range   Glucose-Capillary 156 (H) 70 - 99 mg/dL    Comment: Glucose reference range applies only to samples taken after fasting for at least 8 hours.  CBC with Differential/Platelet     Status: Abnormal   Collection Time: 06/08/21  3:48 PM  Result Value Ref Range   WBC 9.7 4.0 - 10.5 K/uL   RBC 4.30 3.87 - 5.11 MIL/uL   Hemoglobin 12.7 12.0 - 15.0 g/dL   HCT 40.5 36.0 - 46.0 %   MCV 94.2 80.0 - 100.0 fL   MCH 29.5 26.0 - 34.0 pg   MCHC 31.4 30.0 - 36.0 g/dL   RDW 14.5 11.5 - 15.5 %   Platelets 206 150 - 400 K/uL   nRBC 0.0 0.0 - 0.2 %   Neutrophils Relative % 64 %   Neutro Abs 6.3 1.7 - 7.7 K/uL   Lymphocytes Relative 31 %   Lymphs Abs 3.0 0.7 - 4.0 K/uL   Monocytes Relative 4 %   Monocytes Absolute 0.4 0.1 - 1.0 K/uL   Eosinophils Relative 0 %   Eosinophils Absolute 0.0 0.0 - 0.5 K/uL   Basophils Relative 0 %   Basophils Absolute 0.0 0.0 - 0.1 K/uL   Immature Granulocytes 1 %   Abs Immature Granulocytes 0.10 (H) 0.00 - 0.07 K/uL    Comment: Performed at [facility] [address] [place], [place] [address]  Comprehensive metabolic panel     Status: Abnormal   Collection Time: 06/08/21  3:48 PM  Result Value Ref Range   Sodium 149 (H) 135 - 145 mmol/L   Potassium 3.6 3.5 - 5.1 mmol/L   Chloride 110 98 - 111 mmol/L   CO2 15 (L) 22 - 32 mmol/L   Glucose, Bld 165 (H) 70 - 99 mg/dL    Comment: Glucose reference range applies only to samples taken after fasting for at least 8 hours.   BUN 43 (H) 8 - 23 mg/dL   Creatinine, Ser 1.82 (H) 0.44 - 1.00 mg/dL   Calcium 11.6 (H) 8.9 - 10.3 mg/dL   Total Protein 6.4 (L) 6.5 - 8.1 g/dL   Albumin 3.6 3.5 - 5.0 g/dL   AST 22  15 - 41 U/L   ALT 14 0 - 44 U/L   Alkaline Phosphatase 40 38 - 126 U/L   Total Bilirubin 2.0 (H) 0.3 - 1.2 mg/dL   GFR, Estimated 29 (L) >60 mL/min    Comment: (NOTE) Calculated using the CKD-EPI Creatinine Equation (2021)    Anion gap 24 (H) 5 - 15    Comment: Electrolytes repeated  to confirm. Performed at [facility], [place] [address] [place], [place] [address]   Troponin I (High Sensitivity)     Status: Abnormal   Collection Time: 06/08/21  3:48 PM  Result Value Ref Range   Troponin I (High Sensitivity) 96 (H) <18 ng/L    Comment: (NOTE) Elevated high sensitivity troponin I (hsTnI) values and significant  changes across serial measurements may suggest ACS but many other  chronic and acute conditions are known to elevate hsTnI results.  Refer to the Links section for chest pain algorithms and additional  guidance. Performed at [facility], [place] [address], [place], [place] [address]   Lactic acid, plasma     Status: Abnormal   Collection Time: 06/08/21  3:49 PM  Result Value Ref Range   Lactic Acid, Venous 6.4 (HH) 0.5 - 1.9 mmol/L    Comment: CRITICAL RESULT CALLED TO, READ BACK BY AND VERIFIED WITH: C.YANG,RN @1825  06/08/2021 [email or web address] Performed at [facility], [place] [address], [place], [place] [address]   Type and screen [place]     Status: None   Collection Time: 06/08/21  3:50 PM  Result Value Ref Range   ABO/RH(D) O POS    Antibody Screen NEG    Sample Expiration      06/11/2021,2359 Performed at [facility], [place] [address], [place], [place] [address]   Resp Panel by RT-PCR (Flu A&B, Covid) Nasopharyngeal Swab     Status: Abnormal   Collection Time: 06/08/21  4:08 PM   Specimen: Nasopharyngeal Swab; Nasopharyngeal(NP) swabs in vial transport medium  Result Value Ref Range   SARS Coronavirus 2 by RT PCR POSITIVE (A) NEGATIVE    Comment: (NOTE) SARS-CoV-2 target nucleic acids are DETECTED.  The SARS-CoV-2 RNA is  generally detectable in upper respiratory specimens during the acute phase of infection. Positive results are indicative of the presence of the identified virus, but do not rule out bacterial infection or co-infection with other pathogens not detected by the test. Clinical correlation with patient history and other diagnostic information is necessary to determine patient infection status. The expected result is Negative.  Fact Sheet for Patients: EntrepreneurPulse.com.au  Fact Sheet for Healthcare Providers: IncredibleEmployment.be  This test is not yet approved or cleared by the [place] FDA and  has been authorized for detection and/or diagnosis of SARS-CoV-2 by FDA under an Emergency Use Authorization (EUA).  This EUA will remain in effect (meaning this test can be used) for the duration of  the COVID-19 declaration under Section 564(b)(1) of the A ct, 21 U.S.C. section 360bbb-3(b)(1), unless the authorization is terminated or revoked sooner.     Influenza A by PCR NEGATIVE NEGATIVE   Influenza B by PCR NEGATIVE NEGATIVE    Comment: (NOTE) The Xpert Xpress SARS-CoV-2/FLU/RSV plus assay is intended as an aid in the diagnosis of influenza from Nasopharyngeal swab specimens and should not be used as a sole basis for treatment. Nasal washings and aspirates are unacceptable for Xpert Xpress SARS-CoV-2/FLU/RSV testing.  Fact Sheet for Patients: EntrepreneurPulse.com.au  Fact Sheet for Healthcare Providers: IncredibleEmployment.be  This test is not yet approved or cleared by the [place] FDA and has been authorized for detection and/or diagnosis of SARS-CoV-2 by FDA under an Emergency Use Authorization (EUA). This EUA will remain in effect (meaning this test can be used) for the duration of the COVID-19 declaration under Section 564(b)(1) of the Act, 21 U.S.C. section 360bbb-3(b)(1), unless the  authorization is terminated or revoked.  Performed at [facility], [place] [address] [place], [place] [address]   TSH     Status: None   Collection Time: 06/08/21  5:56 PM  Result Value Ref Range   TSH 0.559 0.350 - 4.500 uIU/mL    Comment: Performed by a 3rd Generation assay with a functional sensitivity of <=0.01 uIU/mL. Performed at [facility], [place] [address] [place], [place] [address]   Lactic acid, plasma     Status: Abnormal   Collection Time: 06/08/21  8:49 PM  Result Value Ref Range   Lactic Acid, Venous 3.7 (HH) 0.5 - 1.9 mmol/L    Comment: CRITICAL VALUE NOTED.  VALUE IS CONSISTENT WITH PREVIOUSLY REPORTED AND CALLED VALUE. Performed at [facility], [place] [address] [place], [place] [address]   Troponin I (High Sensitivity)     Status: Abnormal   Collection Time: 06/08/21 10:43 PM  Result Value Ref Range   Troponin I (High Sensitivity) 101 (HH) <18 ng/L    Comment: CRITICAL RESULT CALLED TO, READ BACK BY AND VERIFIED WITH: Magdalen Spatz [facility] 06/08/21 2342 WAYK Performed at [facility], [place] [address] [place], [place] [address]   CK     Status: Abnormal   Collection Time: 06/08/21 10:43 PM  Result Value Ref Range   Total CK 13 (L) 38 - 234 U/L    Comment: Performed at [facility], [place] [address], [place], [place] [address]  Phosphorus     Status: Abnormal   Collection Time: 06/08/21 10:43 PM  Result Value Ref Range   Phosphorus 1.6 (L) 2.5 - 4.6 mg/dL    Comment: Performed at [facility] [address], [place], [place] [address]  Basic metabolic panel     Status: Abnormal   Collection Time: 06/09/21  3:01 AM  Result Value Ref Range   Sodium 143 135 - 145 mmol/L   Potassium 3.0 (L) 3.5 - 5.1 mmol/L   Chloride 112 (H) 98 - 111 mmol/L   CO2 25 22 - 32 mmol/L   Glucose, Bld 126 (H) 70 - 99 mg/dL    Comment: Glucose reference range applies only to samples taken after fasting for at least 8 hours.   BUN 39 (H) 8 - 23 mg/dL    Creatinine, Ser 1.43 (H) 0.44 - 1.00 mg/dL   Calcium 10.0 8.9 - 10.3 mg/dL   GFR, Estimated 39 (L) >60 mL/min    Comment: (NOTE) Calculated using the CKD-EPI Creatinine Equation (2021)    Anion gap 6 5 - 15    Comment: Performed at [facility] [address] [place], [place] [address]  CBC     Status: Abnormal   Collection Time: 06/09/21  3:01 AM  Result Value Ref Range   WBC 8.6 4.0 - 10.5 K/uL   RBC 3.27 (L) 3.87 - 5.11 MIL/uL   Hemoglobin 9.9 (L) 12.0 - 15.0 g/dL   HCT 29.7 (L) 36.0 - 46.0 %   MCV 90.8 80.0 - 100.0 fL   MCH 30.3 26.0 - 34.0 pg   MCHC 33.3 30.0 - 36.0 g/dL   RDW 14.4 11.5 - 15.5 %   Platelets 120 (L) 150 - 400 K/uL   nRBC 0.0 0.0 - 0.2 %    Comment: Performed at [facility], [place] [address], [place], [place] [address]  Blood gas, venous     Status: Abnormal   Collection Time:  06/09/21  3:01 AM  Result Value Ref Range   pH, Ven 7.447 (H) 7.250 - 7.430   pCO2, Ven 38.5 (L) 44.0 - 60.0 mmHg   pO2, Ven 37.3 32.0 - 45.0 mmHg   Bicarbonate 26.4 20.0 - 28.0 mmol/L   Acid-Base Excess 2.5 (H) 0.0 - 2.0 mmol/L   O2 Saturation 68.0 %   Patient temperature 36.4    Collection site LEFT ANTECUBITAL    Drawn by 1390    Sample type VENOUS     Comment: Performed at [facility] [address] [place], [place] [address]  D-dimer, quantitative     Status: Abnormal   Collection Time: 06/09/21 10:28 AM  Result Value Ref Range   D-Dimer, Quant 3.20 (H) 0.00 - 0.50 ug/mL-FEU    Comment: (NOTE) At the manufacturer cut-off value of 0.5 g/mL FEU, this assay has a negative predictive value of 95-100%.This assay is intended for use in conjunction with a clinical pretest probability (PTP) assessment model to exclude pulmonary embolism (PE) and deep venous thrombosis (DVT) in outpatients suspected of PE or DVT. Results should be correlated with clinical presentation. Performed at [facility], [place] [address] [place], [place] [address]   C-reactive  protein     Status: None   Collection Time: 06/09/21 10:28 AM  Result Value Ref Range   CRP 0.6 <1.0 mg/dL    Comment: Performed at [facility] [address] [place], [place] [address]    Current Facility-Administered Medications  Medication Dose Route Frequency Provider Last Rate Last Admin   acetaminophen (TYLENOL) 160 MG/5ML solution 500 mg  500 mg Oral Q4H PRN Vernelle Emerald, MD   500 mg at 06/09/21 1204   Or   acetaminophen (TYLENOL) suppository 650 mg  650 mg Rectal Q4H PRN Vernelle Emerald, MD   650 mg at 06/09/21 0021   dextrose 5 %-0.9 % sodium chloride infusion   Intravenous Continuous Kc, Ramesh, MD 75 mL/hr at 06/09/21 1152 New Bag at 06/09/21 1152   enoxaparin (LOVENOX) injection 40 mg  40 mg Subcutaneous Q24H Pham, Minh Q, RPH-CPP       fentaNYL (SUBLIMAZE) injection 12.5 mcg  12.5 mcg Intravenous Q2H PRN Shalhoub, Sherryll Burger, MD       mirtazapine (REMERON SOL-TAB) disintegrating tablet 15 mg  15 mg Oral QHS Pegi Milazzo, MD       ondansetron (ZOFRAN) tablet 4 mg  4 mg Oral Q6H PRN Wynetta Fines T, MD       Or   ondansetron [facility]) injection 4 mg  4 mg Intravenous Q6H PRN Wynetta Fines T, MD   4 mg at 06/08/21 2230   potassium chloride 10 mEq in 100 mL IVPB  10 mEq Intravenous Q1 Hr x 4 Kc, Ramesh, MD 100 mL/hr at 06/09/21 1153 10 mEq at 06/09/21 1153   [START ON 06/10/2021] remdesivir 100 mg in sodium chloride 0.9 % 100 mL IVPB  100 mg Intravenous Daily Kc, Maren Beach, MD       sevelamer carbonate (RENVELA) tablet 800 mg  800 mg Oral TID WC Wynetta Fines T, MD       sodium bicarbonate tablet 650 mg  650 mg Oral BID Wynetta Fines T, MD       thiamine (B-1) 400 mg in sodium chloride 0.9 % 50 mL IVPB  400 mg Intravenous Q8H Kc, Maren Beach, MD       [START ON 06/12/2021] thiamine tablet 100 mg  100 mg Oral  TID Onnie Boer Q, RPH-CPP       Vitamin D (Ergocalciferol) (DRISDOL) capsule 50,000 Units  50,000 Units Oral Q7 days Lequita Halt, MD        Musculoskeletal: Strength &  Muscle Tone:  unable to assess, patient uncooperative Gait & Station: unsteady Patient leans: N/A     Psychiatric Specialty Exam:  Presentation  General Appearance: Appropriate for Environment  Eye Contact:Minimal  Speech:Clear and Coherent; Slow  Speech Volume:Decreased  Handedness:Right   Mood and Affect  Mood:Depressed  Affect:Constricted   Thought Process  Thought Processes:Linear  Descriptions of Associations:Intact  Orientation:-- (unable to assess, patient uncooperative)  Thought Content:Logical  History of Schizophrenia/Schizoaffective disorder:No data recorded Duration of Psychotic Symptoms:No data recorded Hallucinations:Hallucinations: None  Ideas of Reference:None  Suicidal Thoughts:Suicidal Thoughts: Yes, Passive SI Passive Intent and/or Plan: Without Plan; Without Intent  Homicidal Thoughts:Homicidal Thoughts: No   Sensorium  Memory:-- (unable to assess, patient uncooperative)  Judgment:Poor  Insight:Poor   Executive Functions  Concentration:Fair  Attention Span:Fair  Recall:-- (unable to assess, patient uncooperative)  Fund of Knowledge:-- (unable to assess, patient uncooperative)  Language:Good   Psychomotor Activity  Psychomotor Activity:Psychomotor Activity: Decreased; Psychomotor Retardation   Assets  Assets:Social Support   Sleep  Sleep:Sleep: Fair   Physical Exam: Physical Exam Review of Systems  Psychiatric/Behavioral:  Positive for depression and suicidal ideas.   Blood pressure 133/86, pulse 99, temperature 97.6 F (36.4 C), temperature source Oral, resp. rate 18, height 5\' 6"  (1.676 m), weight 71.4 kg, SpO2 100 %. Body mass index is 25.41 kg/m.  Treatment Plan Summary: 76 y.o. 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 Per her daughter, patient is depressed with passive suicidal  ideation and her symptoms has been getting progressively worse. She has been refusing medications, food and losing weight. She will benefit from inpatient geriatric psychiatric admission after she is medically stabilized.  Recommendations: -Consider 1:1 sitter for safety-patient continues to express suicidal thoughts -Consider Mirtazapine 15 mg SOL-TAB at bedtime for depression, sleep and appetite stimulation - Optimized treatment for underline medical issues, rehydration and feeding(FTT) - Consider social worker consult for geriatric inpatient psychiatric admission after patient is medically cleared  Disposition: Recommend psychiatric Inpatient admission when medically cleared. Supportive therapy provided about ongoing stressors. Psychiatric service will follow  Corena Pilgrim, MD 06/09/2021 12:46 PM

## 2021-06-09 NOTE — Progress Notes (Signed)
HOSPITAL MEDICINE OVERNIGHT EVENT NOTE    Notified by nursing that repeat troponin is 101, essentially unchanged from the previous troponin performed earlier in the day on 1/13 which was 96.  Nursing reports the patient continues to be chest pain-free.    Chart reviewed, admitting provider believes elevated troponin is secondary to demand ischemia.  Considering flat trajectory of the patient's troponin elevation no further troponins will be ordered.  Amber Emerald  MD [facility]

## 2021-06-09 NOTE — Progress Notes (Signed)
PROGRESS NOTE    Amber [name]  ZGY:[id number] DOB: 07-12-1945 DOA: 06/08/2021 PCP: Lindell Spar, MD   Brief Narrative/Hospital Course: Amber [name], 75 y.o. 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  As per daughter patient started having swallowing problem for several weeks with dry heaves after eating solid food, episodes of confusion present hallucination and complaining of feeling something stuck in the throat when swallowing solid food and choked x2 and has refused to eat any solid food past week and mainly drinking water,  patient also complained "Has had enough and wanted to end her life".   According to PCPs note, patient was worked up recently for elevated creatinine levels, renal ultrasound showed slight left-sided hydronephrosis.  Pelvis ultrasound showed mass, for which patient was referred to see outpatient OB/GYN.  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  And patient was started on vitamin D supplement however family reported patient has not been compliant with any of her pills.  PCP also tried Remeron and Ranexa, and patient has not been compliant either In the ED was hypotensive tachycardic labs with lactic acidosis 6.1, elevated creatinine 1.8 hypernatremia 149 hypercalcemia 11.6.  Was given 2 L bolus LR, blood pressure improved, also placed on broad-spectrum antibiotics.  CT head chronic atrophy with white matter microvascular disease, chest x-ray no active disease Renal ultrasound 2.2 cm right renal calculus and right renal pain  with mild right-sided hydronephrosis. COVID-19 positive. Husband had covid 3 wks ago and patient  had sore throat and stuffy nose that time hre symptoms onset from October 2011, seen by neurology and had work  up.  Subjective: Seen examined She is alert awake, c/o being weak. Has nausea-going on for long time. Overnight no fever blood pressure in 120s saturating 100% on room air Labs shows improving renal function sodium improved potassium 3.0  Assessment & Plan:  Failure to thrive, mild confusion-mild acute metabolic encephalopathy:  multifactorial in the setting of swallowing difficulties, COVID-19 infection: Speech , RD eval continue IV hydration, palliative care consult, PT OT eval. Has had neuro work up and MRI for same and unrevealing.  CT head in ED no acute finding, no obvious evidence of infection chest x-ray. We will try thiamine high dose as she has not been eating well.  Check esophagogram  AKI on CKDIIIb Dehydration  Hypotension: Baseline creatinine 1.5 last year. Renal failure dehydration and hypotension due to prerenal/poor oral intake.  BP improved with IV fluid bolus renal function improving continue IV fluids monitor output, monitor renal function.  Renal ultrasound with bilateral obstructive renal calculus and mild right-sided hydronephrosis.  Continue Renvela, sodium bicarb Recent Labs  Lab 06/08/21 1548 06/09/21 0301  BUN 43* 39*  CREATININE 1.82* 1.43*    Lactic acidosis, metabolic acidosis: again suspect due to hypotension dehydration improving with IV fluids currently afebrile no other evidence of infections beside COVID.  Antibiotics on hold.   Hypernatremia: From poor oral intake resolved.  Keep on IV fluids  Hypercalcemia Secondary hyperparathyroidism Vitamin D deficiency: She has had recent work-up, calcium improved to 10, this is likely in the setting of volume depletion Secondary to hyper parathyroidism from outpatient work-up but also had vitamin D deficiency, continue to replete.  Hypokalemia/hypophosphatemia: We will replete  Recent Labs  Lab 06/08/21 1548 06/08/21 2243 06/09/21 0301  K 3.6  --  3.0*  CALCIUM 11.6*  --  10.0  PHOS  --  1.6*  --      Elevated troponin:Trop:96>101, no chest pain EKG with sinus tachycardia likely demand ischemia due to tachycardia hypertension renal failure hypokalemia.  Follow-up echocardiogram,follow-up TSH  COVID-19 infection, high risk of decompensation will discuss with daughter for remdesivir x3 days currently not hypoxic currently.  Check CRP D-dimer. DUPLEX OF LEGS. Start remdesivirx3 doses, both are agreeable.  Depression no SI psych eval.  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  Await for further psychiatry input  Goals of care currently full code we discussed about involving palliative care to discuss CODE STATUS daughter is in agreement.  At this time we will continue with full scope of treatment.  DVT prophylaxis: enoxaparin (LOVENOX) injection 40 mg Start: 06/09/21 1930 Code Status:   Code Status: Full Code Family Communication: plan of care discussed with patient and daughter at bedside. Status is: Inpatient Remains inpatient appropriate because: For ongoing management of failure to thrive Disposition: Currently not medically stable for discharge. Anticipated Disposition: TBD  Total time spent in the care of this patient  50  MINUTES Objective: Vitals last 24 hrs: Vitals:   06/09/21 0000 06/09/21 0105 06/09/21 0601 06/09/21 1003  BP: 135/80 (!) 147/97 125/75 133/86  Pulse: (!) 104 (!) 110 99 99  Resp: 17 20 18 18   Temp: 98.2 F (36.8 C) (!) 97.5 F (36.4 C) (!) 97.5 F (36.4 C) 97.6 F (36.4 C)  TempSrc: Oral Oral Oral Oral  SpO2: 98% 100% 100% 100%  Weight:   71.4 kg   Height:   5\' 6"  (1.676 m)    Weight change:   Intake/Output Summary (Last 24 hours) at 06/09/2021 1122 Last data filed at 06/09/2021 1610 Gross per 24 hour  Intake 1124.87 ml  Output 100 ml  Net 1024.87 ml   Net IO Since Admission: 1,024.87 mL [06/09/21 1122]   Physical Examination: General exam: Aa0x2-3 weak,older than stated  age. HEENT:Oral mucosa DRY, Ear/Nose WNL grossly,dentition normal. Respiratory system: B/l clear BS, no use of accessory muscle, non tender. Cardiovascular system: S1 & S2 +,No JVD. Gastrointestinal system: Abdomen soft, NT,ND, BS+. Nervous System:Alert, awake, moving extremities but weak all over. Extremities: edema non pitting,distal peripheral pulses palpable.  Skin: No rashes, no icterus. MSK: Normal muscle bulk, tone, power.  Medications reviewed:  Scheduled Meds:  enoxaparin (LOVENOX) injection  40 mg Subcutaneous Q24H   sevelamer carbonate  800 mg Oral TID WC   sodium bicarbonate  650 mg Oral BID   [START ON 06/12/2021] thiamine  100 mg Oral TID   Vitamin D (Ergocalciferol)  50,000 Units Oral Q7 days   Continuous Infusions:  dextrose 5 % and 0.9% NaCl     potassium chloride     remdesivir 200 mg in sodium chloride 0.9% 250 mL IVPB     Followed by   Derrill Memo ON 06/10/2021] remdesivir 100 mg in NS 100 mL     thiamine injection     Diet Order             Diet full liquid Room service appropriate? No; Fluid consistency: Thin  Diet effective now                          Weight change:   Wt Readings from Last 3 Encounters:  06/09/21 71.4 kg  04/25/21 71.2 kg  04/24/21 70.8 kg     Consultants:see note  Procedures:see note Antimicrobials: Anti-infectives (From admission, onward)    Start     Dose/Rate Route Frequency Ordered Stop   06/10/21 2000  vancomycin (VANCOCIN) IVPB 1000 mg/200 mL premix  Status:  Discontinued        1,000 mg 200 mL/hr over 60 Minutes Intravenous Every 36 hours 06/08/21 1834 06/08/21 1843   06/10/21 1000  remdesivir 100 mg in sodium chloride 0.9 % 100 mL IVPB       See Hyperspace for full Linked Orders Report.   100 mg 200 mL/hr over 30 Minutes Intravenous Daily 06/09/21 1010 06/12/21 0959   06/09/21 1130  remdesivir 200 mg in sodium chloride 0.9% 250 mL IVPB       See Hyperspace for full Linked Orders Report.   200 mg 580 mL/hr over  30 Minutes Intravenous Once 06/09/21 1010     06/09/21 0600  ceFEPIme (MAXIPIME) 2 g in sodium chloride 0.9 % 100 mL IVPB  Status:  Discontinued        2 g 200 mL/hr over 30 Minutes Intravenous Every 12 hours 06/08/21 1834 06/08/21 1843   06/08/21 1630  vancomycin (VANCOREADY) IVPB 1500 mg/300 mL  Status:  Discontinued        1,500 mg 150 mL/hr over 120 Minutes Intravenous  Once 06/08/21 1624 06/08/21 1843   06/08/21 1615  ceFEPIme (MAXIPIME) 2 g in sodium chloride 0.9 % 100 mL IVPB        2 g 200 mL/hr over 30 Minutes Intravenous  Once 06/08/21 1609 06/08/21 1819   06/08/21 1615  metroNIDAZOLE (FLAGYL) IVPB 500 mg        500 mg 100 mL/hr over 60 Minutes Intravenous  Once 06/08/21 1609 06/08/21 1841   06/08/21 1615  vancomycin (VANCOCIN) IVPB 1000 mg/200 mL premix  Status:  Discontinued        1,000 mg 200 mL/hr over 60 Minutes Intravenous  Once 06/08/21 1609 06/08/21 1624      Culture/Microbiology No results found for: SDES, [place], CULT, REPTSTATUS  Other culture-see note  Unresulted Labs (From admission, onward)     Start     Ordered   06/10/21 0500  Comprehensive metabolic panel  Daily,   R      06/09/21 0807   06/10/21 0500  CBC  Daily,   R      06/09/21 0807   06/10/21 0500  Magnesium  Tomorrow morning,   R        06/09/21 0807   06/10/21 0500  Phosphorus  Tomorrow morning,   R        06/09/21 0807   06/09/21 1011  Procalcitonin  Add-on,   AD        06/09/21 1010   06/09/21 1011  C-reactive protein  Add-on,   AD        06/09/21 1010   06/08/21 1934  Calcium / creatinine ratio, urine  Once,   R        06/08/21 1933   06/08/21 1608  Urine Culture  (Septic presentation on arrival (screening labs, nursing and treatment orders for obvious sepsis))  ONCE - STAT,   STAT       Question:  Indication  Answer:  Sepsis   06/08/21 1609   06/08/21 1549  Blood culture (routine x 2)  BLOOD CULTURE X 2,   STAT      06/08/21 1548   06/08/21 1548  Urinalysis,  Routine w reflex  microscopic  Once,   STAT        06/08/21 1548          Data Reviewed: I have personally reviewed following labs and imaging studies CBC: Recent Labs  Lab 06/08/21 1548 06/09/21 0301  WBC 9.7 8.6  NEUTROABS 6.3  --   HGB 12.7 9.9*  HCT 40.5 29.7*  MCV 94.2 90.8  PLT 206 742*   Basic Metabolic Panel: Recent Labs  Lab 06/08/21 1548 06/08/21 2243 06/09/21 0301  NA 149*  --  143  K 3.6  --  3.0*  CL 110  --  112*  CO2 15*  --  25  GLUCOSE 165*  --  126*  BUN 43*  --  39*  CREATININE 1.82*  --  1.43*  CALCIUM 11.6*  --  10.0  PHOS  --  1.6*  --    GFR: Estimated Creatinine Clearance: 35.5 mL/min (A) (by C-G formula based on SCr of 1.43 mg/dL (H)). Liver Function Tests: Recent Labs  Lab 06/08/21 1548  AST 22  ALT 14  ALKPHOS 40  BILITOT 2.0*  PROT 6.4*  ALBUMIN 3.6   No results for input(s): LIPASE, AMYLASE in the last 168 hours. No results for input(s): AMMONIA in the last 168 hours. Coagulation Profile: Recent Labs  Lab 06/08/21 1529  INR 1.1   Cardiac Enzymes: Recent Labs  Lab 06/08/21 2243  CKTOTAL 13*   BNP (last 3 results) No results for input(s): PROBNP in the last 8760 hours. HbA1C: No results for input(s): HGBA1C in the last 72 hours. CBG: Recent Labs  Lab 06/08/21 1545  GLUCAP 156*   Lipid Profile: No results for input(s): CHOL, HDL, LDLCALC, TRIG, CHOLHDL, LDLDIRECT in the last 72 hours. Thyroid Function Tests: Recent Labs    06/08/21 1756  TSH 0.559   Anemia Panel: No results for input(s): VITAMINB12, FOLATE, FERRITIN, TIBC, IRON, RETICCTPCT in the last 72 hours. Sepsis Labs: Recent Labs  Lab 06/08/21 1549 06/08/21 2049  LATICACIDVEN 6.4* 3.7*    Recent Results (from the past 240 hour(s))  Resp Panel by RT-PCR (Flu A&B, Covid) Nasopharyngeal Swab     Status: Abnormal   Collection Time: 06/08/21  4:08 PM   Specimen: Nasopharyngeal Swab; Nasopharyngeal(NP) swabs in vial transport medium  Result Value Ref Range Status    SARS Coronavirus 2 by RT PCR POSITIVE (A) NEGATIVE Final    Comment: (NOTE) SARS-CoV-2 target nucleic acids are DETECTED.  The SARS-CoV-2 RNA is generally detectable in upper respiratory specimens during the acute phase of infection. Positive results are indicative of the presence of the identified virus, but do not rule out bacterial infection or co-infection with other pathogens not detected by the test. Clinical correlation with patient history and other diagnostic information is necessary to determine patient infection status. The expected result is Negative.  Fact Sheet for Patients: EntrepreneurPulse.com.au  Fact Sheet for Healthcare Providers: IncredibleEmployment.be  This test is not yet approved or cleared by the [place] FDA and  has been authorized for detection and/or diagnosis of SARS-CoV-2 by FDA under an Emergency Use Authorization (EUA).  This EUA will remain in effect (meaning this test can be used) for the duration of  the COVID-19 declaration under Section 564(b)(1) of the A ct, 21 U.S.C. section 360bbb-3(b)(1), unless the authorization is terminated or revoked sooner.     Influenza A by PCR NEGATIVE NEGATIVE Final   Influenza B by PCR NEGATIVE NEGATIVE Final    Comment: (  NOTE) The Xpert Xpress SARS-CoV-2/FLU/RSV plus assay is intended as an aid in the diagnosis of influenza from Nasopharyngeal swab specimens and should not be used as a sole basis for treatment. Nasal washings and aspirates are unacceptable for Xpert Xpress SARS-CoV-2/FLU/RSV testing.  Fact Sheet for Patients: EntrepreneurPulse.com.au  Fact Sheet for Healthcare Providers: IncredibleEmployment.be  This test is not yet approved or cleared by the [place] FDA and has been authorized for detection and/or diagnosis of SARS-CoV-2 by FDA under an Emergency Use Authorization (EUA). This EUA will remain in effect  (meaning this test can be used) for the duration of the COVID-19 declaration under Section 564(b)(1) of the Act, 21 U.S.C. section 360bbb-3(b)(1), unless the authorization is terminated or revoked.  Performed at [facility], [place] [address], [place], [place] [address]      Radiology Studies: CT Head Wo Contrast  Result Date: 06/08/2021 CLINICAL DATA:  Mental status change EXAM: CT HEAD WITHOUT CONTRAST TECHNIQUE: Contiguous axial images were obtained from the base of the skull through the vertex without intravenous contrast. RADIATION DOSE REDUCTION: This exam was performed according to the departmental dose-optimization program which includes automated exposure control, adjustment of the mA and/or kV according to patient size and/or use of iterative reconstruction technique. COMPARISON:  None. FINDINGS: Brain: No acute intracranial hemorrhage. No focal mass lesion. No CT evidence of acute infarction. No midline shift or mass effect. No hydrocephalus. Basilar cisterns are patent. There are periventricular and subcortical white matter hypodensities. Generalized cortical atrophy. Vascular: No hyperdense vessel or unexpected calcification. Skull: Normal. Negative for fracture or focal lesion. Sinuses/Orbits: Paranasal sinuses and mastoid air cells are clear. Orbits are clear. Other: None. IMPRESSION: No acute intracranial findings. Chronic atrophy and white matter microvascular disease Electronically Signed   By: Suzy Bouchard M.D.   On: 06/08/2021 17:43   US RENAL  Result Date: 06/08/2021 CLINICAL DATA:  Acute renal injury. EXAM: RENAL / URINARY TRACT ULTRASOUND COMPLETE COMPARISON:  Renal CT, dated March 05, 2021. FINDINGS: Right Kidney: Renal measurements: 9.9 cm x 6.0 cm x 5.6 cm = volume: 176.41 mL. Echogenicity within normal limits. No mass is visualized. A 2.2 cm shadowing echogenic focus is seen within the right renal pelvis. Mild right-sided hydronephrosis is noted. Left Kidney:  Renal measurements: 9.2 cm x 5.1 cm x 5.5 cm = volume: 134.44 mL. Echogenicity within normal limits. No mass or hydronephrosis visualized. Bladder: Appears normal for degree of bladder distention. Other: None. IMPRESSION: 2.2 cm right renal calculus within the right renal pelvis, with mild right-sided hydronephrosis. This corresponds to the findings seen on the prior abdomen and pelvis CT. Electronically Signed   By: Virgina Norfolk M.D.   On: 06/08/2021 20:01   DG Chest Portable 1 View  Result Date: 06/08/2021 CLINICAL DATA:  Hypotension.  Decreased oral intake. EXAM: PORTABLE CHEST 1 VIEW COMPARISON:  Chest radiograph dated April 24, 2021. FINDINGS: The heart size and mediastinal contours are within normal limits. Both lungs are clear. The visualized skeletal structures are unremarkable. IMPRESSION: No active disease. Electronically Signed   By: Keane Police D.O.   On: 06/08/2021 17:23     LOS: 1 day   Antonieta Pert, MD [facility]  06/09/2021, 11:22 AM

## 2021-06-10 ENCOUNTER — Inpatient Hospital Stay (HOSPITAL_COMMUNITY): Payer: Medicare Other

## 2021-06-10 DIAGNOSIS — Z7189 Other specified counseling: Secondary | ICD-10-CM

## 2021-06-10 DIAGNOSIS — R609 Edema, unspecified: Secondary | ICD-10-CM

## 2021-06-10 DIAGNOSIS — Z515 Encounter for palliative care: Secondary | ICD-10-CM

## 2021-06-10 LAB — COMPREHENSIVE METABOLIC PANEL
ALT: 26 U/L (ref 0–44)
AST: 29 U/L (ref 15–41)
Albumin: 2.9 g/dL — ABNORMAL LOW (ref 3.5–5.0)
Alkaline Phosphatase: 42 U/L (ref 38–126)
Anion gap: 11 (ref 5–15)
BUN: 27 mg/dL — ABNORMAL HIGH (ref 8–23)
CO2: 24 mmol/L (ref 22–32)
Calcium: 10 mg/dL (ref 8.9–10.3)
Chloride: 112 mmol/L — ABNORMAL HIGH (ref 98–111)
Creatinine, Ser: 1.24 mg/dL — ABNORMAL HIGH (ref 0.44–1.00)
GFR, Estimated: 46 mL/min — ABNORMAL LOW (ref >60)
Glucose, Bld: 67 mg/dL — ABNORMAL LOW (ref 70–99)
Potassium: 2.5 mmol/L — CL (ref 3.5–5.1)
Sodium: 147 mmol/L — ABNORMAL HIGH (ref 135–145)
Total Bilirubin: 1.4 mg/dL — ABNORMAL HIGH (ref 0.3–1.2)
Total Protein: 5.5 g/dL — ABNORMAL LOW (ref 6.5–8.1)

## 2021-06-10 LAB — CBC
HCT: 33.5 % — ABNORMAL LOW (ref 36.0–46.0)
Hemoglobin: 10.9 g/dL — ABNORMAL LOW (ref 12.0–15.0)
MCH: 29.2 pg (ref 26.0–34.0)
MCHC: 32.5 g/dL (ref 30.0–36.0)
MCV: 89.8 fL (ref 80.0–100.0)
Platelets: 111 10*3/uL — ABNORMAL LOW (ref 150–400)
RBC: 3.73 MIL/uL — ABNORMAL LOW (ref 3.87–5.11)
RDW: 14.5 % (ref 11.5–15.5)
WBC: 5.3 10*3/uL (ref 4.0–10.5)
nRBC: 0 % (ref 0.0–0.2)

## 2021-06-10 LAB — TROPONIN I (HIGH SENSITIVITY): Troponin I (High Sensitivity): 80 ng/L — ABNORMAL HIGH (ref <18)

## 2021-06-10 LAB — POTASSIUM: Potassium: 3.4 mmol/L — ABNORMAL LOW (ref 3.5–5.1)

## 2021-06-10 LAB — MAGNESIUM: Magnesium: 1.4 mg/dL — ABNORMAL LOW (ref 1.7–2.4)

## 2021-06-10 LAB — PHOSPHORUS: Phosphorus: 1.4 mg/dL — ABNORMAL LOW (ref 2.5–4.6)

## 2021-06-10 LAB — D-DIMER, QUANTITATIVE: D-Dimer, Quant: 1.68 ug/mL-FEU — ABNORMAL HIGH (ref 0.00–0.50)

## 2021-06-10 MED ORDER — POTASSIUM CHLORIDE CRYS ER 20 MEQ PO TBCR
40.0000 meq | EXTENDED_RELEASE_TABLET | Freq: Once | ORAL | Status: AC
  Administered 2021-06-10: 40 meq via ORAL
  Filled 2021-06-10: qty 2

## 2021-06-10 MED ORDER — KCL IN DEXTROSE-NACL 20-5-0.45 MEQ/L-%-% IV SOLN
INTRAVENOUS | Status: DC
  Filled 2021-06-10 (×8): qty 1000

## 2021-06-10 MED ORDER — POTASSIUM CHLORIDE 2 MEQ/ML IV SOLN: INTRAVENOUS | Status: DC

## 2021-06-10 MED ORDER — POTASSIUM PHOSPHATES 15 MMOLE/5ML IV SOLN
30.0000 mmol | Freq: Once | INTRAVENOUS | Status: AC
  Administered 2021-06-10: 30 mmol via INTRAVENOUS
  Filled 2021-06-10: qty 10

## 2021-06-10 MED ORDER — POTASSIUM CHLORIDE CRYS ER 20 MEQ PO TBCR
20.0000 meq | EXTENDED_RELEASE_TABLET | Freq: Every day | ORAL | Status: DC
  Administered 2021-06-10: 20 meq via ORAL
  Filled 2021-06-10: qty 1

## 2021-06-10 MED ORDER — KCL-LACTATED RINGERS-D5W 20 MEQ/L IV SOLN
INTRAVENOUS | Status: DC
  Filled 2021-06-10: qty 1000

## 2021-06-10 MED ORDER — ENOXAPARIN SODIUM 80 MG/0.8ML IJ SOSY
70.0000 mg | PREFILLED_SYRINGE | Freq: Two times a day (BID) | INTRAMUSCULAR | Status: DC
  Administered 2021-06-10 – 2021-06-13 (×5): 70 mg via SUBCUTANEOUS
  Filled 2021-06-10 (×6): qty 0.8

## 2021-06-10 MED ORDER — MAGNESIUM SULFATE 4 GM/100ML IV SOLN
4.0000 g | Freq: Once | INTRAVENOUS | Status: AC
  Administered 2021-06-10: 4 g via INTRAVENOUS
  Filled 2021-06-10: qty 100

## 2021-06-10 MED ORDER — POTASSIUM CHLORIDE 10 MEQ/100ML IV SOLN
10.0000 meq | INTRAVENOUS | Status: DC
  Administered 2021-06-10: 10 meq via INTRAVENOUS
  Filled 2021-06-10 (×4): qty 100

## 2021-06-10 NOTE — Progress Notes (Signed)
Patient comfortably lying in bed. No acute distress noted.

## 2021-06-10 NOTE — Progress Notes (Signed)
PO4 1.4, mg 1.4.  Mg 4g x1 today Kphos 3mmol x1 today  Labs in AM  [place], PharmD, [place], AAHIVP, CPP Infectious Disease Pharmacist 06/10/2021 11:45 AM

## 2021-06-10 NOTE — Progress Notes (Signed)
[place] for enoxaparin Indication: DVT  Allergies  Allergen Reactions   Shellfish Allergy     "I pass out"    Patient Measurements: Height: 5\' 6"  (167.6 cm) Weight: 71.4 kg (157 lb 6.5 oz) IBW/kg (Calculated) : 59.3  Vital Signs: Temp: 97.7 F (36.5 C) (01/15 0628) BP: 129/74 (01/15 0628) Pulse Rate: 90 (01/15 0628)  Labs: Recent Labs    06/08/21 1529 06/08/21 1548 06/08/21 1548 06/08/21 2243 06/09/21 0301 06/10/21 0800  HGB  --  12.7   < >  --  9.9* 10.9*  HCT  --  40.5  --   --  29.7* 33.5*  PLT  --  206  --   --  120* 111*  APTT <20*  --   --   --   --   --   LABPROT 14.0  --   --   --   --   --   INR 1.1  --   --   --   --   --   CREATININE  --  1.82*  --   --  1.43* 1.24*  CKTOTAL  --   --   --  13*  --   --   TROPONINIHS  --  96*  --  101*  --  80*   < > = values in this interval not displayed.    Estimated Creatinine Clearance: 40.9 mL/min (A) (by C-G formula based on SCr of 1.24 mg/dL (H)).   Medical History: Past Medical History:  Diagnosis Date   Anxiety    Hypertension       Assessment: 31 yoF admitted encephalopathic with COVID. 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 Cr stable, H/H ok.  Goal of Therapy:  Anti-Xa level 0.6-1 units/ml 4hrs after LMWH dose given Monitor platelets by anticoagulation protocol: Yes   Plan:  Enoxaparin 1mg /kg SQ q12h Watch Cr and pltc closely  Arrie Senate, PharmD, BCPS, [facility] Clinical Pharmacist [phone] Please check AMION for all [facility] numbers 06/10/2021

## 2021-06-10 NOTE — Progress Notes (Signed)
HOSPITAL MEDICINE OVERNIGHT EVENT NOTE    Notified by nursing that patient has been complaining of chest discomfort this morning.  Unfortunately, due to patient's confusion is she is unwilling to describe her chest discomfort further.  At the time that I discussed this with nursing the patient is resting comfortably and no longer complaining of chest discomfort.  EKG obtained which is not significantly changed from prior.  Considering how difficult patient's chest discomfort is to assess, obtaining repeat troponin now.  Also, upon further chart review, D-dimer was noted to be elevated yesterday morning and found to be 3.2, likely attributed to the COVID-19 infection at that time.  We will obtain a repeat chemistry this morning and based on renal function patient will likely require a CTA chest or VQ scan to ensure there is no evidence of pulmonary embolism.  Will notify day provider to follow-up and carry the work-up forward.   Vernelle Emerald  MD [facility]

## 2021-06-10 NOTE — Progress Notes (Signed)
BLE and LUE venous duplex has been completed.  Results can be found under chart review under CV PROC. 06/10/2021 4:24 PM Pius Byrom RVT, RDMS

## 2021-06-10 NOTE — Evaluation (Addendum)
Physical Therapy Evaluation Patient Details Name: Amber [name] MRN: [id number] DOB: Oct 13, 1945 Today's Date: 06/10/2021  History of Present Illness  Amber [name] is a 76 y.o. 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 Comes from home where she lives with her spouse; Chart review and pt indicate  a functional decline leading up to this admission, including decr oral intake and functional decline from using the [place] to needing the bedpan; Pt not a clear historian; states she hasn't walked in a very long time; Presents to PT with decr activity tolerance; showing a systolic BP decr of more than 18mmHg from supine position to sitting position, coupled with incr anxiety, requesting to lay back down, and adamant refusal of standing trials or getting OOB; Pt currently with functional limitations due to the deficits listed below (see PT Problem List). Pt will benefit from skilled PT to increase their independence and safety with mobility to allow discharge to the venue listed below.    Noting plan for Geriatric Psych Admission post acutely, and PT is in agreement; What functional level must pt be at to be able to get to Geriatric Psych facility?        Recommendations for follow up therapy are one component of a multi-disciplinary discharge planning process, led by the attending physician.  Recommendations may be updated based on patient status, additional functional criteria and insurance authorization.  Follow Up Recommendations Skilled nursing-short term rehab (<3 hours/day); might need SNF for Rehab to incr independence with mobility and ADLs prior to [place] admission -- not sure what functional level she needs to be at to go to [facility] Recommended at Discharge Frequent or  constant Supervision/Assistance  Patient can return home with the following  Two people to help with walking and/or transfers;A lot of help with bathing/dressing/bathroom;Help with stairs or ramp for entrance    Equipment Recommendations Rolling walker (2 wheels);Hospital bed;Wheelchair cushion (measurements PT)  Recommendations for Other Services  OT consult (will order per protocol)    Functional Status Assessment Patient has had a recent decline in their functional status and/or demonstrates limited ability to make significant improvements in function in a reasonable and predictable amount of time     Precautions / Restrictions Precautions Precautions: Fall Precaution Comments: Covid Prec      Mobility  Bed Mobility Overal bed mobility: Needs Assistance Bed Mobility: Supine to Sit;Sit to Supine     Supine to sit: +2 for physical assistance;Mod assist Sit to supine: Min guard   General bed mobility comments: Pt very anxious/scared, and asking for support at back as well as in front to sit up; Mod assist to elevate trunk to sit; once BP done, pt pushing back to lay down; noted able to lift LEs back on to bed without assist    Transfers                   General transfer comment: Refusing    Ambulation/Gait               General Gait Details: Refusing  Stairs            Wheelchair Mobility    Modified Rankin (Stroke Patients Only)       Balance Overall balance assessment: Needs assistance   Sitting balance-Leahy Scale:  Fair                                       Pertinent Vitals/Pain Pain Assessment: Faces Faces Pain Scale: Hurts even more Pain Location: Unspecified; Grimace likely a response to not wanting to get up, or stress with decr BP while sitting up Pain Descriptors / Indicators: Grimacing Pain Intervention(s): Repositioned    Home Living Family/patient expects to be discharged to:: Private residence Living  Arrangements: Spouse/significant other Available Help at Discharge: Family Type of Home: House Home Access: Stairs to enter   Technical brewer of Steps: 1   Home Layout: One level Home Equipment: BSC/3in1;Wheelchair - manual (pt did not indicate if she has a RW when asked) Additional Comments: Did not get a chance to ask about bathroom setup; Reached a point when she would not answer questions    Prior Function Prior Level of Function : Needs assist             Mobility Comments: Unclear historian; Pt says she hasn't walked in "a very long time"; Says she used a wheelchair to go to MD appointments "it (wheelchair) might have been from my house" ADLs Comments: States she stopped using a bedside commode for toileting when she "couldn't get back up from it"     Hand Dominance        Extremity/Trunk Assessment   Upper Extremity Assessment Upper Extremity Assessment: Generalized weakness (Lots of cues and assist to raise LUE to place BP cuff)    Lower Extremity Assessment Lower Extremity Assessment: Generalized weakness;Difficult to assess due to impaired cognition   06/10/21 1245 06/10/21 1250  Vital Signs  Patient Position (if appropriate) Orthostatic Vitals  --   Orthostatic Lying   BP- Lying 135/70 148/76  Pulse- Lying 64 67  Orthostatic Sitting  BP- Sitting 113/88  (greater than 57mmHg drop in SBP) 118/73 (in bed to semi-chair position; HOB elevated 47deg); (greater than 94mmHg drop in SBP)  Pulse- Sitting 74 70          Communication   Communication: No difficulties  Cognition Arousal/Alertness: Awake/alert Behavior During Therapy: Restless;Anxious Overall Cognitive Status: No family/caregiver present to determine baseline cognitive functioning                                 General Comments: Adamantly refusing getting OOB today        General Comments General comments (skin integrity, edema, etc.): Session conducted on Room Air and  O2 sats remained greater than or equal to 98%    Exercises     Assessment/Plan    PT Assessment Patient needs continued PT services  PT Problem List Decreased strength;Decreased range of motion;Decreased activity tolerance;Decreased balance;Decreased mobility;Decreased coordination;Decreased cognition;Decreased knowledge of use of DME;Decreased safety awareness;Decreased knowledge of precautions;Cardiopulmonary status limiting activity       PT Treatment Interventions DME instruction;Gait training;Functional mobility training;Therapeutic activities;Therapeutic exercise;Balance training;Neuromuscular re-education;Cognitive remediation;Patient/family education;Wheelchair mobility training    PT Goals (Current goals can be found in the Care Plan section)  Acute Rehab PT Goals Patient Stated Goal: "I will not get out of bed today" PT Goal Formulation: Patient unable to participate in goal setting Time For Goal Achievement: 06/24/21 Potential to Achieve Goals: Fair    Frequency Min 2X/week (on a trial basis, depending on he rability to participate)  Co-evaluation               AM-PAC PT "6 Clicks" Mobility  Outcome Measure Help needed turning from your back to your side while in a flat bed without using bedrails?: A Lot Help needed moving from lying on your back to sitting on the side of a flat bed without using bedrails?: A Lot Help needed moving to and from a bed to a chair (including a wheelchair)?: Total Help needed standing up from a chair using your arms (e.g., wheelchair or bedside chair)?: Total Help needed to walk in hospital room?: Total Help needed climbing 3-5 steps with a railing? : Total 6 Click Score: 8    End of Session Equipment Utilized During Treatment: Other (comment) (vitals machine for BP checks) Activity Tolerance: Other (comment) (Limited by pt refusal to stand or get OOB to recliner) Patient left: in bed;with call bell/phone within reach;with bed  alarm set Nurse Communication: Mobility status PT Visit Diagnosis: Other abnormalities of gait and mobility (R26.89);Muscle weakness (generalized) (M62.81);Adult, failure to thrive (R62.7)    Time: 1245-1306 PT Time Calculation (min) (ACUTE ONLY): 21 min   Charges:   PT Evaluation $PT Eval Moderate Complexity: 1 Mod          Roney [place], [place]  Acute Rehabilitation Services Pager [phone] Office [phone]   [place] 06/10/2021, 1:46 PM

## 2021-06-10 NOTE — Progress Notes (Signed)
PROGRESS NOTE    Amber [name]  MPN:[id number] DOB: 01-Feb-1946 DOA: 06/08/2021 PCP: Lindell Spar, MD   Brief Narrative/Hospital Course: [name], 76 y.o. 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  As per daughter patient started having swallowing problem for several weeks with dry heaves after eating solid food, episodes of confusion present hallucination and complaining of feeling something stuck in the throat when swallowing solid food and choked x2 and has refused to eat any solid food past week and mainly drinking water,  patient also complained "Has had enough and wanted to end her life".   According to PCPs note, patient was worked up recently for elevated creatinine levels, renal ultrasound showed slight left-sided hydronephrosis.  Pelvis ultrasound showed mass, for which patient was referred to see outpatient OB/GYN.  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  And patient was started on vitamin D supplement however family reported patient has not been compliant with any of her pills.  PCP also tried Remeron and Ranexa, and patient has not been compliant either In the ED was hypotensive tachycardic labs with lactic acidosis 6.1, elevated creatinine 1.8 hypernatremia 149 hypercalcemia 11.6.  Was given 2 L bolus LR, blood pressure improved, also placed on broad-spectrum antibiotics.  CT head chronic atrophy with white matter microvascular disease, chest x-ray no active disease Renal ultrasound 2.2 cm right renal calculus and right renal pain  with mild right-sided hydronephrosis. COVID-19 positive. Husband had covid 3 wks ago and patient  had sore throat and stuffy nose that time hre symptoms onset from October 2011, seen by neurology and had work  up.  Subjective: Seen and examined this morning daughter and husband at the bedside Patient was complaining of chest pain abdominal pain but got irritated and angry when asked to describe this morning This morning she attributes it to anxiety no pain currently pain is mostly in upper abdomen. This happened after she was given Remeron and see went into panic attack.  Mentation much improved today she is compliant, agreeable with taking medications swallowing and also working with speech. Daughter thinks she has taken 360 degree turn and look so much better Overnight no fever, saturating well on room air at 100%  Assessment & Plan:  Failure to thrive, mild confusion-mild acute metabolic encephalopathy: multifactorial in the setting of swallowing difficulties, COVID-19 infection: Condition much improved.  Speech , RD eval on board, patient not fully cooperative or participating yesterday but agreeable today.  Continue on gentle IV hydration supportive care, particularly consulted.  Continue PT OT.  She has had neuro work up and MRI for same and unrevealing. CT head in ED no acute finding, no obvious evidence of infection chest x-ray.UA leukocytes trace, WBC 6-10,, culture pending. doing trial of thiamine high dose as she has not been eating well-she seems to be responding well.   Dysphagia seen by speech not fully cooperating continue current diet, DG esohagogram ordered.  AKI on CKDIIIb Dehydration  baseline creatinine 1.5 last year.  Patient AKI prerenal due to hypotension volume depletion from poor oral intake, antihypertensives.  Creatinine improving, continue IV fluids, monitor intake output and BMP.Renal ultrasound with bilateral non-obstructive renal calculus and mild right-sided hydronephrosis.  Continue Renvela, sodium bicarb.  TSH stable. Recent Labs  Lab 06/08/21 1548 06/09/21 0301 06/10/21 0800  BUN 43* 39* 27*  CREATININE 1.82* 1.43* 1.24*    Lactic acidosis, metabolic  acidosis: suspect due to hypotension dehydration, improving with IV fluids. afebrile no other evidence of infections beside COVID.  Antibiotics on hold.   Hypernatremia: Resolved with IV fluids.    Hypercalcemia Secondary hyperparathyroidism Vitamin D deficiency: She has had recent work-up, calcium improved to 10> repeat stable, likely in the setting of volume epletion,Secondary to hyper parathyroidism from outpatient work-up but also had vitamin D deficiency, continue to replete.  Hypokalemia/hypophosphatemia/hypomagnesemia: We will replete this morning again  Recent Labs  Lab 06/08/21 1548 06/08/21 2243 06/09/21 0301 06/10/21 0800  K 3.6  --  3.0* 2.5*  CALCIUM 11.6*  --  10.0 10.0  MG  --   --   --  1.4*  PHOS  --  1.6*  --  1.4*   Vague chest/abdomen pain:overnight EKG nonischemic changes, repeat troponin downtrending. Elevated troponin:Trop:96>101, ?demand ischemia due to tachycardia hypotension renal failure on presentation. TTE-EF 45-50%, no R WMA, G1 DD, normal RV systolic function size and normal pulmonary artery systolic pressure. Positive D-dimer: D dimer also downtrending likely from COVID infection given patient's presentation with chest pain elevated troponin we will obtain perfusion scan we will hold off on IV contrast given elevated creatinine.  Follow-up duplex  COVID-19 infection, high risk of decompensation, unvaccinated, continue remdesivir x3 days currently not hypoxic no respiratory symptoms.  CRP stable D-dimer elevated monitor D-dimer currently.   Swelling of left upper arm again uptrending duplex, keep it elevated.  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  DDU:[id number] full code we discussed about involving palliative care to discuss CODE STATUS daughter is in agreement.  At this time we will continue with full scope  of treatment.  DVT prophylaxis: enoxaparin (LOVENOX) injection 40 mg Start: 06/09/21 1930 Code Status:   Code Status: Full Code Family Communication: plan of care discussed with patient and daughter at bedside. Status is: Inpatient Remains inpatient appropriate because: For ongoing management of failure to thrive Disposition: Currently not medically stable for discharge. Anticipated Disposition: For now patient will need geriatric psychiatric until she is cleared by psych Once medically  improved.  Total time spent in the care of this patient  50  MINUTES Objective: Vitals last 24 hrs: Vitals:   06/09/21 1003 06/09/21 2130 06/10/21 0527 06/10/21 0628  BP: 133/86 (!) 149/86 (!) 133/96 129/74  Pulse: 99 (!) 105 91 90  Resp: 18 17 20 18   Temp: 97.6 F (36.4 C) 97.7 F (36.5 C) 97.8 F (36.6 C) 97.7 F (36.5 C)  TempSrc: Oral Oral    SpO2: 100% 100% 100% 100%  Weight:      Height:       Weight change:   Intake/Output Summary (Last 24 hours) at 06/10/2021 1105 Last data filed at 06/10/2021 0600 Gross per 24 hour  Intake 865.52 ml  Output 260 ml  Net 605.52 ml   Net IO Since Admission: 1,630.39 mL [06/10/21 1105]   Physical Examination: General exam: AAO pleasant, communicative older than stated age, weak appearing. HEENT:Oral mucosa moist, Ear/Nose WNL grossly, dentition normal. Respiratory system: bilaterally clear, no use of accessory muscle. Cardiovascular system: S1 & S2 +, No JVD. Gastrointestinal system: Abdomen soft,NT,ND, BS+. Nervous System:Alert, awake, moving extremities and grossly nonfocal. Extremities: edematous LUE, distal peripheral pulses palpable.  Skin: No rashes,no icterus. MSK: Normal muscle bulk,tone, power.  Medications reviewed:  Scheduled Meds:  enoxaparin (LOVENOX) injection  40 mg Subcutaneous Q24H   mirtazapine  15 mg Oral QHS   potassium chloride  20 mEq Oral Daily   sevelamer carbonate  800 mg Oral TID WC   sodium bicarbonate  650 mg  Oral BID   [START ON 06/12/2021] thiamine  100 mg Oral TID   Vitamin D (Ergocalciferol)  50,000 Units Oral Q7 days   Continuous Infusions:  dextrose 5 %-0.45% NaCl with KCl/Additives Pediatric custom IV fluid     magnesium sulfate bolus IVPB 4 g (06/10/21 0941)   potassium PHOSPHATE IVPB (in mmol)     remdesivir 100 mg in NS 100 mL     thiamine injection 400 mg (06/10/21 0631)   Diet Order             Diet full liquid Room service appropriate? No; Fluid consistency: Thin  Diet effective now                          Weight change:   Wt Readings from Last 3 Encounters:  06/09/21 71.4 kg  04/25/21 71.2 kg  04/24/21 70.8 kg     Consultants:see note  Procedures:see note Antimicrobials: Anti-infectives (From admission, onward)    Start     Dose/Rate Route Frequency Ordered Stop   06/10/21 2000  vancomycin (VANCOCIN) IVPB 1000 mg/200 mL premix  Status:  Discontinued        1,000 mg 200 mL/hr over 60 Minutes Intravenous Every 36 hours 06/08/21 1834 06/08/21 1843   06/10/21 1000  remdesivir 100 mg in sodium chloride 0.9 % 100 mL IVPB       See Hyperspace for full Linked Orders Report.   100 mg 200 mL/hr over 30 Minutes Intravenous Daily 06/09/21 1010 06/12/21 0959   06/09/21 1130  remdesivir 200 mg in sodium chloride 0.9% 250 mL IVPB       See Hyperspace for full Linked Orders Report.   200 mg 580 mL/hr over 30 Minutes Intravenous Once 06/09/21 1010 06/09/21 1224   06/09/21 0600  ceFEPIme (MAXIPIME) 2 g in sodium chloride 0.9 % 100 mL IVPB  Status:  Discontinued        2 g 200 mL/hr over 30 Minutes Intravenous Every 12 hours 06/08/21 1834 06/08/21 1843   06/08/21 1630  vancomycin (VANCOREADY) IVPB 1500 mg/300 mL  Status:  Discontinued        1,500 mg 150 mL/hr over 120 Minutes Intravenous  Once 06/08/21 1624 06/08/21 1843   06/08/21 1615  ceFEPIme (MAXIPIME) 2 g in sodium chloride 0.9 % 100 mL IVPB        2 g 200 mL/hr over 30 Minutes Intravenous  Once 06/08/21 1609  06/08/21 1819   06/08/21 1615  metroNIDAZOLE (FLAGYL) IVPB 500 mg        500 mg 100 mL/hr over 60 Minutes Intravenous  Once 06/08/21 1609 06/08/21 1841   06/08/21 1615  vancomycin (VANCOCIN) IVPB 1000 mg/200 mL premix  Status:  Discontinued        1,000 mg 200 mL/hr over 60 Minutes Intravenous  Once 06/08/21 1609 06/08/21 1624      Culture/Microbiology    Component Value Date/Time   SDES BLOOD RIGHT HAND 06/08/2021 2037   SPECREQUEST  06/08/2021 2037    BOTTLES DRAWN AEROBIC AND ANAEROBIC Blood Culture results may not be optimal due to an inadequate volume of blood received in culture bottles   CULT  06/08/2021  2037    NO GROWTH < 24 HOURS Performed at [facility] [address], [place], [place] [address]    REPTSTATUS PENDING 06/08/2021 2037    Other culture-see note  Unresulted Labs (From admission, onward)     Start     Ordered   06/11/21 0500  Magnesium  Tomorrow morning,   R        06/10/21 1057   06/11/21 0500  Phosphorus  Tomorrow morning,   R        06/10/21 1058   06/10/21 1600  Potassium  Once,   R        06/10/21 1105   06/10/21 0500  Comprehensive metabolic panel  Daily,   R      06/09/21 0807   06/10/21 0500  CBC  Daily,   R      06/09/21 0807   06/08/21 1934  Calcium / creatinine ratio, urine  Once,   R        06/08/21 1933   06/08/21 1608  Urine Culture  (Septic presentation on arrival (screening labs, nursing and treatment orders for obvious sepsis))  ONCE - STAT,   STAT       Question:  Indication  Answer:  Sepsis   06/08/21 1609          Data Reviewed: I have personally reviewed following labs and imaging studies CBC: Recent Labs  Lab 06/08/21 1548 06/09/21 0301 06/10/21 0800  WBC 9.7 8.6 5.3  NEUTROABS 6.3  --   --   HGB 12.7 9.9* 10.9*  HCT 40.5 29.7* 33.5*  MCV 94.2 90.8 89.8  PLT 206 120* 841*   Basic Metabolic Panel: Recent Labs  Lab 06/08/21 1548 06/08/21 2243 06/09/21 0301 06/10/21 0800  NA 149*  --  143 147*  K  3.6  --  3.0* 2.5*  CL 110  --  112* 112*  CO2 15*  --  25 24  GLUCOSE 165*  --  126* 67*  BUN 43*  --  39* 27*  CREATININE 1.82*  --  1.43* 1.24*  CALCIUM 11.6*  --  10.0 10.0  MG  --   --   --  1.4*  PHOS  --  1.6*  --  1.4*   GFR: Estimated Creatinine Clearance: 40.9 mL/min (A) (by C-G formula based on SCr of 1.24 mg/dL (H)). Liver Function Tests: Recent Labs  Lab 06/08/21 1548 06/10/21 0800  AST 22 29  ALT 14 26  ALKPHOS 40 42  BILITOT 2.0* 1.4*  PROT 6.4* 5.5*  ALBUMIN 3.6 2.9*   No results for input(s): LIPASE, AMYLASE in the last 168 hours. No results for input(s): AMMONIA in the last 168 hours. Coagulation Profile: Recent Labs  Lab 06/08/21 1529  INR 1.1   Cardiac Enzymes: Recent Labs  Lab 06/08/21 2243  CKTOTAL 13*   BNP (last 3 results) No results for input(s): PROBNP in the last 8760 hours. HbA1C: No results for input(s): HGBA1C in the last 72 hours. CBG: Recent Labs  Lab 06/08/21 1545  GLUCAP 156*   Lipid Profile: No results for input(s): CHOL, HDL, LDLCALC, TRIG, CHOLHDL, LDLDIRECT in the last 72 hours. Thyroid Function Tests: Recent Labs    06/09/21 1233  TSH 0.545   Anemia Panel: No results for input(s): VITAMINB12, FOLATE, FERRITIN, TIBC, IRON, RETICCTPCT in the last 72 hours. Sepsis Labs: Recent Labs  Lab 06/08/21 1549 06/08/21 2049 06/09/21 1028  PROCALCITON  --   --  0.16  LATICACIDVEN 6.4* 3.7*  --     Recent Results (from the past 240 hour(s))  Resp Panel by RT-PCR (Flu A&B, Covid) Nasopharyngeal Swab     Status: Abnormal   Collection Time: 06/08/21  4:08 PM   Specimen: Nasopharyngeal Swab; Nasopharyngeal(NP) swabs in vial transport medium  Result Value Ref Range Status   SARS Coronavirus 2 by RT PCR POSITIVE (A) NEGATIVE Final    Comment: (NOTE) SARS-CoV-2 target nucleic acids are DETECTED.  The SARS-CoV-2 RNA is generally detectable in upper respiratory specimens during the acute phase of infection. Positive results  are indicative of the presence of the identified virus, but do not rule out bacterial infection or co-infection with other pathogens not detected by the test. Clinical correlation with patient history and other diagnostic information is necessary to determine patient infection status. The expected result is Negative.  Fact Sheet for Patients: EntrepreneurPulse.com.au  Fact Sheet for Healthcare Providers: IncredibleEmployment.be  This test is not yet approved or cleared by the [place] FDA and  has been authorized for detection and/or diagnosis of SARS-CoV-2 by FDA under an Emergency Use Authorization (EUA).  This EUA will remain in effect (meaning this test can be used) for the duration of  the COVID-19 declaration under Section 564(b)(1) of the A ct, 21 U.S.C. section 360bbb-3(b)(1), unless the authorization is terminated or revoked sooner.     Influenza A by PCR NEGATIVE NEGATIVE Final   Influenza B by PCR NEGATIVE NEGATIVE Final    Comment: (NOTE) The Xpert Xpress SARS-CoV-2/FLU/RSV plus assay is intended as an aid in the diagnosis of influenza from Nasopharyngeal swab specimens and should not be used as a sole basis for treatment. Nasal washings and aspirates are unacceptable for Xpert Xpress SARS-CoV-2/FLU/RSV testing.  Fact Sheet for Patients: EntrepreneurPulse.com.au  Fact Sheet for Healthcare Providers: IncredibleEmployment.be  This test is not yet approved or cleared by the [place] FDA and has been authorized for detection and/or diagnosis of SARS-CoV-2 by FDA under an Emergency Use Authorization (EUA). This EUA will remain in effect (meaning this test can be used) for the duration of the COVID-19 declaration under Section 564(b)(1) of the Act, 21 U.S.C. section 360bbb-3(b)(1), unless the authorization is terminated or revoked.  Performed at [facility], [place] [address]  [place], [place] [address]   Blood culture (routine x 2)     Status: None (Preliminary result)   Collection Time: 06/08/21  4:45 PM   Specimen: BLOOD RIGHT HAND  Result Value Ref Range Status   Specimen Description BLOOD RIGHT HAND  Final   Special Requests   Final    BOTTLES DRAWN AEROBIC AND ANAEROBIC Blood Culture results may not be optimal due to an inadequate volume of blood received in culture bottles   Culture   Final    NO GROWTH < 24 HOURS Performed at [facility], [place] [address] [place], [place] [address]    Report Status PENDING  Incomplete  Blood culture (routine x 2)     Status: None (Preliminary result)   Collection Time: 06/08/21  8:37 PM   Specimen: BLOOD RIGHT HAND  Result Value Ref Range Status   Specimen Description BLOOD RIGHT HAND  Final   Special Requests   Final    BOTTLES DRAWN AEROBIC AND ANAEROBIC Blood Culture results may not be optimal due to an inadequate volume of blood received in culture bottles   Culture   Final    NO GROWTH < 24 HOURS Performed at  [facility], [place] [address] [place], [place] [address]    Report Status PENDING  Incomplete     Radiology Studies: CT Head Wo Contrast  Result Date: 06/08/2021 CLINICAL DATA:  Mental status change EXAM: CT HEAD WITHOUT CONTRAST TECHNIQUE: Contiguous axial images were obtained from the base of the skull through the vertex without intravenous contrast. RADIATION DOSE REDUCTION: This exam was performed according to the departmental dose-optimization program which includes automated exposure control, adjustment of the mA and/or kV according to patient size and/or use of iterative reconstruction technique. COMPARISON:  None. FINDINGS: Brain: No acute intracranial hemorrhage. No focal mass lesion. No CT evidence of acute infarction. No midline shift or mass effect. No hydrocephalus. Basilar cisterns are patent. There are periventricular and subcortical white matter hypodensities. Generalized cortical  atrophy. Vascular: No hyperdense vessel or unexpected calcification. Skull: Normal. Negative for fracture or focal lesion. Sinuses/Orbits: Paranasal sinuses and mastoid air cells are clear. Orbits are clear. Other: None. IMPRESSION: No acute intracranial findings. Chronic atrophy and white matter microvascular disease Electronically Signed   By: Suzy Bouchard M.D.   On: 06/08/2021 17:43   US RENAL  Result Date: 06/08/2021 CLINICAL DATA:  Acute renal injury. EXAM: RENAL / URINARY TRACT ULTRASOUND COMPLETE COMPARISON:  Renal CT, dated March 05, 2021. FINDINGS: Right Kidney: Renal measurements: 9.9 cm x 6.0 cm x 5.6 cm = volume: 176.41 mL. Echogenicity within normal limits. No mass is visualized. A 2.2 cm shadowing echogenic focus is seen within the right renal pelvis. Mild right-sided hydronephrosis is noted. Left Kidney: Renal measurements: 9.2 cm x 5.1 cm x 5.5 cm = volume: 134.44 mL. Echogenicity within normal limits. No mass or hydronephrosis visualized. Bladder: Appears normal for degree of bladder distention. Other: None. IMPRESSION: 2.2 cm right renal calculus within the right renal pelvis, with mild right-sided hydronephrosis. This corresponds to the findings seen on the prior abdomen and pelvis CT. Electronically Signed   By: Virgina Norfolk M.D.   On: 06/08/2021 20:01   DG Chest Portable 1 View  Result Date: 06/08/2021 CLINICAL DATA:  Hypotension.  Decreased oral intake. EXAM: PORTABLE CHEST 1 VIEW COMPARISON:  Chest radiograph dated April 24, 2021. FINDINGS: The heart size and mediastinal contours are within normal limits. Both lungs are clear. The visualized skeletal structures are unremarkable. IMPRESSION: No active disease. Electronically Signed   By: Keane Police D.O.   On: 06/08/2021 17:23   ECHOCARDIOGRAM LIMITED  Result Date: 06/09/2021    ECHOCARDIOGRAM LIMITED REPORT   Patient Name:   KHAMILLE BEYNON Date of Exam: 06/09/2021 Medical Rec #:  [id number]     Height:       66.0 in  Accession #:    [id number]    Weight:       157.4 lb Date of Birth:  02-26-1946     BSA:          1.806 m Patient Age:    18 years      BP:           133/86 mmHg Patient Gender: F             HR:           84 bpm. Exam Location:  Inpatient Procedure: Limited Echo, Limited Color Doppler and Cardiac Doppler Indications:   elevated troponin  History:       Patient has no prior history of Echocardiogram examinations.  Covid.  Sonographer:   Johny Chess RDCS Referring      [phone] Lequita Halt Phys: IMPRESSIONS  1. Left ventricular ejection fraction, by estimation, is 45 to 50%. The left ventricle has mildly decreased function. The left ventricle has no regional wall motion abnormalities. There is mild concentric left ventricular hypertrophy. Left ventricular diastolic parameters are consistent with Grade I diastolic dysfunction (impaired relaxation).  2. Right ventricular systolic function is normal. The right ventricular size is normal. There is normal pulmonary artery systolic pressure.  3. Mild mitral valve regurgitation.  4. Aortic valve regurgitation is not visualized.  5. Ectactic aorta. 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  6. The inferior vena cava is normal in size with greater than 50% respiratory variability, suggesting right atrial pressure of 3 mmHg. FINDINGS  Left Ventricle: Left ventricular ejection fraction, by estimation, is 45 to 50%. The left ventricle has mildly decreased function. The left ventricle has no regional wall motion abnormalities. There is mild concentric left ventricular hypertrophy. Abnormal (paradoxical) septal motion, consistent with left bundle branch block. Left ventricular diastolic parameters are consistent with Grade I diastolic dysfunction (impaired relaxation). Right Ventricle: The right ventricular size is normal. Right ventricular systolic function is normal. There is normal pulmonary artery systolic pressure. The tricuspid regurgitant velocity  is 2.16 m/s, and with an assumed right atrial pressure of 3 mmHg,  the estimated right ventricular systolic pressure is 41.9 mmHg. Mitral Valve: Mild mitral valve regurgitation. Tricuspid Valve: The tricuspid valve is not well visualized. Tricuspid valve regurgitation is not demonstrated. Aortic Valve: Aortic valve regurgitation is not visualized. Pulmonic Valve: The pulmonic valve was not well visualized. Pulmonic valve regurgitation is not visualized. Aorta: Ectactic aorta. Small ascending aortic aneurysm 3.9 cm. Venous: The inferior vena cava is normal in size with greater than 50% respiratory variability, suggesting right atrial pressure of 3 mmHg.  LV Volumes (MOD) LV vol d, MOD A2C: 94.6 ml Diastology LV vol s, MOD A2C: 55.5 ml LV e' medial:    4.79 cm/s LV SV MOD A2C:     39.1 ml LV E/e' medial:  10.6                            LV e' lateral:   7.18 cm/s                            LV E/e' lateral: 7.0  IVC IVC diam: 1.40 cm AORTIC VALVE LVOT Vmax:   63.30 cm/s LVOT Vmean:  38.300 cm/s LVOT VTI:    0.093 m  AORTA Ao Asc diam: 3.90 cm MITRAL VALVE               TRICUSPID VALVE MV Area (PHT): 3.08 cm    TR Peak grad:   18.7 mmHg MV Decel Time: 246 msec    TR Vmax:        216.00 cm/s MV E velocity: 50.60 cm/s MV A velocity: 84.00 cm/s  SHUNTS MV E/A ratio:  0.60        Systemic VTI: 0.09 m Phineas Inches Electronically signed by Phineas Inches Signature Date/Time: 06/09/2021/1:54:37 PM    Final      LOS: 2 days   Antonieta Pert, MD [facility]  06/10/2021, 11:05 AM

## 2021-06-10 NOTE — Consult Note (Signed)
Consultation Note Date: 06/10/2021   Patient Name: Amber [name]  DOB: Sep 16, 1945  MRN: [id number]  Age / Sex: 76 y.o., female  PCP: Lindell Spar, MD Referring Physician: Antonieta Pert, MD  Reason for Consultation: Establishing goals of care  HPI/Patient Profile: 76 y.o. female  with past medical history of hypertension, CKD stage 3b, vitamin D deficiency, secondary hyperparathyroidism, urinary incontinence, left sided adnexal mass (referred to OB/GYN), recent failure to thrive admitted on 06/08/2021 with failure to thrive with signs of acute depression/anxiety leading to physical decline with symptoms reported of weight loss, swallowing difficulty, hallucinations, paranoia, confusion, and family report she has expressed wanting to end her life.   Clinical Assessment and Goals of Care: I met today at [name]'s bedside along with her husband and daughter, Amber [name]. [name] is awake and lying in bed. She does have some underlying confusion and becomes fixated on some aspects of her care. She is able to talk and answer appropriately most of the time. She talks of being overwhelmed and people asking too much of her. She talks of feelings of "her heart pounding" and I explained that this is part of her anxiety. We discussed having small goals and for today to focus on trying to eat a little better and to take her medication. I strongly encouraged her to take her mirtazapine as I feel this will help her sleep as well as her depression, anxiety, appetite. She is open to trying to work with therapy but only if her family is present.   Family at bedside tell me that today is completely different from previous when she was uncooperative and not willing to even eat or take her medications. Today they are more hopeful that she will cooperate to have care and hopefully feel better and improve. They were concerned that they were  heading towards a path of comfort care and hospice but today feel more hopeful. We agree to take this one day at a time and I will follow for support. [name] asks me to come back and visit with her tomorrow.   I did not address code status today given [name]'s significant depression and she seems to be having a good day today. Will defer this discussion until she is in a more stable mindset to participate in these types of discussions and decisions. There is a question of dementia but this does not seem as overtly severe as her depression/anxiety.   All questions/concerns addressed. Emotional support provided. Will follow.   Primary Decision Maker NEXT OF KIN husband and adult children (she has 5 in total)    SUMMARY OF RECOMMENDATIONS   - Ongoing goals of care conversations and support  Code Status/Advance Care Planning: Full code - plan to clarify at a future visit   Symptom Management:  Depression: Mirtazapine 15 mg qhs.  Minimize medications with hopes of less overwhelming feeling for [name].   Palliative Prophylaxis:  Aspiration and Delirium Protocol  Prognosis:  Difficult to determine acutely as could improve  if she responds well to treatment for her psychiatric condition. However, also overall failure to thrive and risk or further decompensation.   Discharge Planning: To Be Determined      Primary Diagnoses: Present on Admission:  Hypernatremia   I have reviewed the medical record, interviewed the patient and family, and examined the patient. The following aspects are pertinent.  Past Medical History:  Diagnosis Date   Anxiety    Hypertension    Social History   Socioeconomic History   Marital status: Married    Spouse name: Not on file   Number of children: Not on file   Years of education: Not on file   Highest education level: Not on file  Occupational History   Not on file  Tobacco Use   Smoking status: Former    Types: Cigarettes    Smokeless tobacco: Never  Substance and Sexual Activity   Alcohol use: Not Currently   Drug use: Not Currently   Sexual activity: Not Currently  Other Topics Concern   Not on file  Social History Narrative   Right handed    one story home    lives with family    Doesn't work currently   Social Determinants of Radio broadcast assistant Strain: Not on file  Food Insecurity: Not on file  Transportation Needs: Not on file  Physical Activity: Not on file  Stress: Not on file  Social Connections: Not on file   Family History  Problem Relation Age of Onset   High blood pressure Maternal Grandmother    Scheduled Meds:  enoxaparin (LOVENOX) injection  40 mg Subcutaneous Q24H   mirtazapine  15 mg Oral QHS   potassium chloride  40 mEq Oral Once   sevelamer carbonate  800 mg Oral TID WC   sodium bicarbonate  650 mg Oral BID   [START ON 06/12/2021] thiamine  100 mg Oral TID   Vitamin D (Ergocalciferol)  50,000 Units Oral Q7 days   Continuous Infusions:  dextrose 5% lactated ringers with KCl 20 mEq/L 100 mL/hr at 06/10/21 1601   magnesium sulfate bolus IVPB 4 g (06/10/21 0941)   potassium chloride     remdesivir 100 mg in NS 100 mL     thiamine injection 400 mg (06/10/21 0631)   PRN Meds:.acetaminophen (TYLENOL) oral liquid 160 mg/5 mL **OR** acetaminophen, fentaNYL (SUBLIMAZE) injection, ondansetron **OR** ondansetron (ZOFRAN) IV Allergies  Allergen Reactions   Shellfish Allergy     "I pass out"   Review of Systems  Constitutional:  Positive for activity change, appetite change and fatigue.  Respiratory:  Negative for shortness of breath.   Neurological:  Positive for weakness.  Psychiatric/Behavioral:  Negative for suicidal ideas. The patient is nervous/anxious.    Physical Exam Vitals and nursing note reviewed.  Constitutional:      General: She is not in acute distress.    Appearance: She is ill-appearing.  Cardiovascular:     Rate and Rhythm: Normal rate.   Pulmonary:     Effort: No tachypnea, accessory muscle usage or respiratory distress.  Abdominal:     General: Abdomen is flat.  Neurological:     Mental Status: She is alert. She is confused.    Vital Signs: BP 129/74 (BP Location: Left Arm)    Pulse 90    Temp 97.7 F (36.5 C)    Resp 18    Ht $R'5\' 6"'Rb$  (1.676 m)    Wt 71.4 kg    SpO2 100%  BMI 25.41 kg/m  Pain Scale: 0-10   Pain Score: 8    SpO2: SpO2: 100 % O2 Device:SpO2: 100 % O2 Flow Rate: .   IO: Intake/output summary:  Intake/Output Summary (Last 24 hours) at 06/10/2021 0943 Last data filed at 06/10/2021 0600 Gross per 24 hour  Intake 865.52 ml  Output 260 ml  Net 605.52 ml    LBM:   Baseline Weight: Weight: 71.2 kg Most recent weight: Weight: 71.4 kg     Palliative Assessment/Data:     Time In: 1000 Time Out: 1110 Time Total: 70 min Greater than 50%  of this time was spent counseling and coordinating care related to the above assessment and plan.  Signed by: Vinie Sill, NP Palliative Medicine Team Pager # [phone] (M-F 8a-5p) Team Phone # [phone] (Nights/Weekends)

## 2021-06-10 NOTE — Progress Notes (Signed)
Date and time results received: 06/10/21 0859 (use smartphrase ".now" to insert current time)  Test: potassium Critical Value: 2.5  Name of Provider Notified: Antonieta Pert  Orders Received? Or Actions Taken?: Actions Taken: MD ordered IV K, MAG, and K-Phos.

## 2021-06-10 NOTE — Progress Notes (Signed)
Patient c/o chest pain and abdominal pain 8/10. When asked to describe pain,patient got irritated why I'm asking a lot of questions. Tried to explain the reason for the questions but patient states " I don't want to describe it." Patient refused fentanyl,when RN asked why,patient replied " I'm not taking no fentanyl,I don't wanna die". Sh[place]houb,MD notified via secure chat. Orders received. EKG done,result read to MD. 06:10 Patient lying in bed in no acute distress. Pain 6/10,patient verbalized feeling a little better. MD,Shalhoub to order labs. Explained to patient that a lab technician will come to draw blood from her and she's agreeable.Will continue to monitor.

## 2021-06-11 ENCOUNTER — Inpatient Hospital Stay (HOSPITAL_COMMUNITY): Payer: Medicare Other

## 2021-06-11 DIAGNOSIS — R1310 Dysphagia, unspecified: Secondary | ICD-10-CM

## 2021-06-11 DIAGNOSIS — R627 Adult failure to thrive: Secondary | ICD-10-CM

## 2021-06-11 DIAGNOSIS — U071 COVID-19: Secondary | ICD-10-CM

## 2021-06-11 LAB — MAGNESIUM: Magnesium: 2 mg/dL (ref 1.7–2.4)

## 2021-06-11 LAB — COMPREHENSIVE METABOLIC PANEL
ALT: 19 U/L (ref 0–44)
AST: 16 U/L (ref 15–41)
Albumin: 2.2 g/dL — ABNORMAL LOW (ref 3.5–5.0)
Alkaline Phosphatase: 33 U/L — ABNORMAL LOW (ref 38–126)
Anion gap: 6 (ref 5–15)
BUN: 20 mg/dL (ref 8–23)
CO2: 16 mmol/L — ABNORMAL LOW (ref 22–32)
Calcium: 8.4 mg/dL — ABNORMAL LOW (ref 8.9–10.3)
Chloride: 117 mmol/L — ABNORMAL HIGH (ref 98–111)
Creatinine, Ser: 1.12 mg/dL — ABNORMAL HIGH (ref 0.44–1.00)
GFR, Estimated: 52 mL/min — ABNORMAL LOW (ref >60)
Glucose, Bld: 81 mg/dL (ref 70–99)
Potassium: 4.2 mmol/L (ref 3.5–5.1)
Sodium: 139 mmol/L (ref 135–145)
Total Bilirubin: 1 mg/dL (ref 0.3–1.2)
Total Protein: 4.2 g/dL — ABNORMAL LOW (ref 6.5–8.1)

## 2021-06-11 LAB — CBC
HCT: 29.8 % — ABNORMAL LOW (ref 36.0–46.0)
Hemoglobin: 9.7 g/dL — ABNORMAL LOW (ref 12.0–15.0)
MCH: 30.2 pg (ref 26.0–34.0)
MCHC: 32.6 g/dL (ref 30.0–36.0)
MCV: 92.8 fL (ref 80.0–100.0)
Platelets: UNDETERMINED 10*3/uL (ref 150–400)
RBC: 3.21 MIL/uL — ABNORMAL LOW (ref 3.87–5.11)
RDW: 14.8 % (ref 11.5–15.5)
WBC: 4.4 10*3/uL (ref 4.0–10.5)
nRBC: 0 % (ref 0.0–0.2)

## 2021-06-11 LAB — CALCIUM / CREATININE RATIO, URINE
Calcium, Ur: 29.8 mg/dL
Calcium/Creat.Ratio: 423 mg/g creat (ref 29–442)
Creatinine, Urine: 70.4 mg/dL

## 2021-06-11 LAB — PHOSPHORUS: Phosphorus: 2.6 mg/dL (ref 2.5–4.6)

## 2021-06-11 MED ORDER — OLANZAPINE 10 MG IM SOLR
5.0000 mg | Freq: Once | INTRAMUSCULAR | Status: AC
  Administered 2021-06-11: 5 mg via INTRAMUSCULAR
  Filled 2021-06-11: qty 10

## 2021-06-11 MED ORDER — OLANZAPINE 10 MG IM SOLR: 2.5000 mg | Freq: Once | INTRAMUSCULAR | Status: DC

## 2021-06-11 NOTE — Consult Note (Signed)
[facility] Consult Follow up  Talked to NP Vinie Sill regarding this Pt.   -Recommend continuing with Remeron Soluble Tab instead of Zyprexa.  -She would benefit from putting a NG Tube/ cortrak to help with feeding and giving medications. - Pt was seen this weekend by Dr Darleene Cleaver and was recommended for geriatric inpatient psychiatric admission after patient is medically cleared.  Dr Armando Reichert MD [place] [facility]

## 2021-06-11 NOTE — Plan of Care (Signed)
°  Problem: Education: Goal: Knowledge of General Education information will improve Description: Including pain rating scale, medication(s)/side effects and non-pharmacologic comfort measures Outcome: Not Progressing   Problem: Health Behavior/Discharge Planning: Goal: Ability to manage health-related needs will improve Outcome: Not Progressing  Patient shows no  interest in plan of care

## 2021-06-11 NOTE — Consult Note (Addendum)
Consultation  Referring Provider:     Dr. Maren [name] Primary Care Physician:  Amber Spar, MD Primary Gastroenterologist:       Amber [name]  Reason for Consultation:     Dysphagia   Attending physician's note  I have taken a history, reviewed the chart and examined the patient. I performed a substantive portion of this encounter, including complete performance of at least one of the key components, in conjunction with the APP. I agree with the APP's note, impression and recommendations.    76 year old female admitted with severe depression, failure to thrive and is COVID-positive No evidence of acute stroke.  Unclear etiology for dysphagia.  Patient is refusing to undergo any work-up at this point.  Agree with psych eval  If patient is willing, will benefit from barium esophagram and modified barium swallow to exclude any mass lesions or significant esophageal stricture or major motility disorder  Please call us back if patient has any change in clinical status  GI will sign off, available if have any questions    Amber [name] , MD [phone]      Impression    Dysphagia in setting of 76 year old female with failure to thrive, COVID, depression Has been evaluated by psych, normal CT head 06/08/21, MRI 03/2021 microvascular disease Difficult history to get, having some nausea and with any food in mouth, question psychosomatic. Does report some intermittent dysphagia mid chest, rare GERD.  COVID-positive On remdesivir, RA  Elevated D-dimer in setting of COVID Nonocclusive DVT left popliteal vein, VQ scan pending  CKD stage IIIb with hyperparathyroidism and vitamin D deficiency Continue vitamin D, IVF    Plan   -Patient declining evaluation with esophagram or endoscopy, discussed both procedures thoroughly with patient, has no fears or questions about the procedures but states "I do not to be poked anymore". -At this time continue supportive care, had  evaluation with speech therapy can see their note 06/09/2021, they suspected functional oropharyngeal swallowing difficulties. -And I think is unreasonable to do a trial of proton pump inhibitor once daily to see if this potentially helps -Continue psych evaluation and medication -If patient decides anytime for further evaluation, would start with barium swallow to evaluate for strictures as well as function. - discussed with daughter Amber [name], will come here this afternoon with her father Amber [name] and try to see if can convince her mother to do barium swallow.  Thank you for your kind consultation, please call us back if patient is interested in further evaluation          HPI:   Amber [name] is a 76 y.o. female with history of hypertension, CKD stage IIIb, secondary hyperparathyroidism, vitamin D deficiency, left-sided adnexal mass, depression presented to the hospital on 06/08/2021 with failure to thrive  Patient initially was refusing to give any history, daughter reports that she had problems swallowing for several weeks, dry heaves after eating solid food, has been throwing away food. Has had dysphagia with solid food and feeling choked.  Only drinking water. Patient has been evaluated by speech, and has barium swallow pending.  Please see speech eval on 06/09/21, Suspect functional oropharyngeal swallowing abilities; however, difficult to assess given limited trials  Patient initially presented hypotensive and tachycardic, lactic acid 6.1, started on broad-spectrum antibiotics vancomycin and cefepime.  BUN 3.4 sodium 149 calcium 11.6 During this visit patient has been seen by palliative care and psychiatry-started on mirtazapine 50 mg at bedtime. Patient is also seen by  registered dietitian and speech evaluation. Patient has chronic nonocclusive DVT left popliteal vein, elevated D-dimer in setting of COVID, VQ scan pending.  On remdesivir patient had AKI due to poor oral intake lactic acidosis  improving, antibiotics on hold.  No family at bedside with the patient.   Patient was on the phone with her husband Amber [name] 50+ years. Patient poor historian and not very forthcoming with answers.  Patient states she has had trouble swallowing "for some time", states will occasionally have ice or crackers to get stuck midway into her chest, denies odynophagia. Patient has very rare GERD unable to pinpoint exactly how often it happens but also denies globulus sensation, clearing of throat frequently. Patient states she does have a lot of nausea over the last 3 to 6 months, can even be just at putting something in her mouth that she gets nauseous right away, denies vomiting but has had some dry heaving. Has had some rare periumbilical abdominal pain once every 2 weeks Bowel movement daily, bowel movement this morning, denies melena or hematochezia.  Has had 30 lb weight loss per patient. Patient has history of smoking quit 3 years ago has a 30-pack-year smoking history at least.  Very rare social drinking, denies drug use. Patient denies any neck surgeries, radiation to head or neck.    Past Medical History:  Diagnosis Date   Anxiety    Hypertension     Surgical History:  She  has no past surgical history on file. Family History:  Her family history includes High blood pressure in her maternal grandmother. Social History:   reports that she has quit smoking. Her smoking use included cigarettes. She has never used smokeless tobacco. She reports that she does not currently use alcohol. She reports that she does not currently use drugs.  Prior to Admission medications   Medication Sig Start Date End Date Taking? Authorizing Provider  acetaminophen (TYLENOL) 500 MG tablet Take 500 mg by mouth every 6 (six) hours as needed for moderate pain or mild pain.   Yes [provider]  famotidine (PEPCID) 20 MG tablet Take 1 tablet (20 mg total) by mouth 2 (two) times daily. 04/25/21  Yes  Amber Spar, MD  folic acid (FOLVITE) 1 MG tablet Take 1 mg by mouth daily.   Yes [provider]  mirtazapine (REMERON) 7.5 MG tablet Take 1 tablet (7.5 mg total) by mouth at bedtime. 03/29/21  Yes Amber Spar, MD  ondansetron (ZOFRAN) 4 MG tablet Take 1 tablet (4 mg total) by mouth every 8 (eight) hours as needed for nausea or vomiting. 04/25/21  Yes Amber Spar, MD  vitamin B-12 (CYANOCOBALAMIN) 500 MCG tablet Take 500 mcg by mouth daily.   Yes [provider]  Vitamin D, Ergocalciferol, (DRISDOL) 1.25 MG (50000 UNIT) CAPS capsule Take 1 capsule (50,000 Units total) by mouth every 7 (seven) days. 04/02/21  Yes Amber Spar, MD    Current Facility-Administered Medications  Medication Dose Route Frequency Provider Last Rate Last Admin   acetaminophen (TYLENOL) 160 MG/5ML solution 500 mg  500 mg Oral Q4H PRN Vernelle Emerald, MD   500 mg at 06/09/21 1204   Or   acetaminophen (TYLENOL) suppository 650 mg  650 mg Rectal Q4H PRN Vernelle Emerald, MD   650 mg at 06/09/21 0021   dextrose 5 % and 0.45 % NaCl with KCl 20 mEq/L infusion   Intravenous Continuous Antonieta Pert, MD 75 mL/hr at 06/11/21 0145 New Bag at  06/11/21 0145   enoxaparin (LOVENOX) injection 70 mg  70 mg Subcutaneous Q12H Einar Grad, RPH   70 mg at 06/10/21 1630   mirtazapine (REMERON SOL-TAB) disintegrating tablet 15 mg  15 mg Oral QHS Akintayo, Mojeed, MD       OLANZapine (ZYPREXA) injection 5 mg  5 mg Intramuscular Once Pershing Proud, NP       ondansetron [facility] tablet 4 mg  4 mg Oral Q6H PRN Lequita Halt, MD       Or   ondansetron [facility]) injection 4 mg  4 mg Intravenous Q6H PRN Wynetta Fines T, MD   4 mg at 06/08/21 2230   sevelamer carbonate (RENVELA) tablet 800 mg  800 mg Oral TID WC Lequita Halt, MD       sodium bicarbonate tablet 650 mg  650 mg Oral BID Wynetta Fines T, MD       thiamine (B-1) 400 mg in sodium chloride 0.9 % 50 mL IVPB  400 mg Intravenous Q8H Kc, Amber Beach, MD 100  mL/hr at 06/11/21 0609 400 mg at 06/11/21 0609   [START ON 06/12/2021] thiamine tablet 100 mg  100 mg Oral TID Onnie Boer Q, RPH-CPP       Vitamin D (Ergocalciferol) (DRISDOL) capsule 50,000 Units  50,000 Units Oral Q7 days Lequita Halt, MD        Allergies as of 06/08/2021 - Review Complete 06/08/2021  Allergen Reaction Noted   Shellfish allergy  03/05/2021    Review of Systems:    Constitutional: + weight loss, fatigue, Denies fever, chills, weakness or fatigue Skin: No rash or itching Cardiovascular: No chest pain, chest pressure or palpitations   Respiratory: No SOB or cough Gastrointestinal: See HPI  Genitourinary: No dysuria or change in urinary frequency Neurological: No headache, dizziness or syncope Musculoskeletal: No new muscle or joint pain Hematologic: No bleeding or bruising Psychiatric: +depression/anxiety     Physical Exam:  Vital signs in last 24 hours: Temp:  [97.6 F (36.4 C)-98 F (36.7 C)] 98 F (36.7 C) (01/16 0900) Pulse Rate:  [63-83] 66 (01/16 0831) Resp:  [16-18] 18 (01/16 0831) BP: (127-145)/(66-88) 138/66 (01/16 0831) SpO2:  [97 %-100 %] 98 % (01/16 0831)   General:   Thin appearing elderly female, female in no acute distress Head:  Normocephalic and atraumatic. Eyes: sclerae anicteric,conjunctive pink  Heart:  regular rate and rhythm, no murmurs or gallops Pulm: Clear anteriorly; no wheezing Abdomen:  Soft, Normal bowel sounds. mild tenderness  suprapubic . Without guarding and Without rebound, without hepatomegaly. Extremities:  Without edema. Msk:  Symmetrical without gross deformities.  Neurologic:  Alert and  oriented x4;  grossly normal neurologically. Skin:   Dry and intact without significant lesions or rashes. Psychiatric: Patient with fixation, inattentive, flat affect.   LAB RESULTS: Recent Labs    06/09/21 0301 06/10/21 0800 06/11/21 0606  WBC 8.6 5.3 4.4  HGB 9.9* 10.9* 9.7*  HCT 29.7* 33.5* 29.8*  PLT 120* 111* PLATELET  CLUMPS NOTED ON SMEAR, UNABLE TO ESTIMATE   BMET Recent Labs    06/09/21 0301 06/10/21 0800 06/10/21 1520 06/11/21 0606  NA 143 147*  --  139  K 3.0* 2.5* 3.4* 4.2  CL 112* 112*  --  117*  CO2 25 24  --  16*  GLUCOSE 126* 67*  --  81  BUN 39* 27*  --  20  CREATININE 1.43* 1.24*  --  1.12*  CALCIUM 10.0 10.0  --  8.4*  LFT Recent Labs    06/11/21 0606  PROT 4.2*  ALBUMIN 2.2*  AST 16  ALT 19  ALKPHOS 33*  BILITOT 1.0   PT/INR Recent Labs    06/08/21 1529  LABPROT 14.0  INR 1.1    STUDIES: DG Chest Port 1 View  Result Date: 06/11/2021 CLINICAL DATA:  Short of breath EXAM: PORTABLE CHEST 1 VIEW COMPARISON:  06/08/2021 FINDINGS: Normal cardiac silhouette with ectatic aorta. Mild linear markings in the lateral aspect of the LEFT lung. No pneumothorax. No pulmonary edema. No acute osseous abnormality. IMPRESSION: Mild LEFT midlung atelectasis versus early infiltrate. Electronically Signed   By: Suzy Bouchard M.D.   On: 06/11/2021 08:34   VAS [place] LOWER EXTREMITY VENOUS (DVT)  Result Date: 06/11/2021  Lower Venous DVT Study Patient Name:  Amber [name]  Date of Exam:   06/10/2021 Medical Rec #: [id number]      Accession #:    [id number] Date of Birth: [birth year]      Patient Gender: F Patient Age:   42 years Exam Location:  [facility] Procedure:      VAS [place] LOWER EXTREMITY VENOUS (DVT) Referring Phys: [place] --------------------------------------------------------------------------------  Indications: Edema.  Limitations: Poor ultrasound/tissue interface and body habitus. Comparison Study: No previous exams Performing Technologist: Jody Hill RVT, RDMS  Examination Guidelines: A complete evaluation includes B-mode imaging, spectral Doppler, color Doppler, and power Doppler as needed of all accessible portions of each vessel. Bilateral testing is considered an integral part of a complete examination. Limited examinations for reoccurring indications may be performed as  noted. The reflux portion of the exam is performed with the patient in reverse Trendelenburg.  +---------+---------------+---------+-----------+----------+-----------------+  RIGHT     Compressibility Phasicity Spontaneity Properties Thrombus Aging     +---------+---------------+---------+-----------+----------+-----------------+  CFV       Full            Yes       Yes                                       +---------+---------------+---------+-----------+----------+-----------------+  SFJ       Full                                                                +---------+---------------+---------+-----------+----------+-----------------+  FV Prox   Full            Yes       Yes                                       +---------+---------------+---------+-----------+----------+-----------------+  FV Mid    Full            Yes       Yes                                       +---------+---------------+---------+-----------+----------+-----------------+  FV Distal Partial         Yes       Yes  Age Indeterminate  +---------+---------------+---------+-----------+----------+-----------------+  PFV       Full                                                                +---------+---------------+---------+-----------+----------+-----------------+  POP       Full            Yes       Yes                                       +---------+---------------+---------+-----------+----------+-----------------+  PTV       Full                                                                +---------+---------------+---------+-----------+----------+-----------------+  PERO      Full                                                                +---------+---------------+---------+-----------+----------+-----------------+   +---------+---------------+---------+-----------+----------+--------------+  LEFT      Compressibility Phasicity Spontaneity Properties Thrombus Aging   +---------+---------------+---------+-----------+----------+--------------+  CFV       Full            Yes       Yes                                    +---------+---------------+---------+-----------+----------+--------------+  SFJ       Full                                                             +---------+---------------+---------+-----------+----------+--------------+  FV Prox   Full            Yes       Yes                                    +---------+---------------+---------+-----------+----------+--------------+  FV Mid    Full            Yes       Yes                                    +---------+---------------+---------+-----------+----------+--------------+  FV Distal Full            Yes       Yes                                    +---------+---------------+---------+-----------+----------+--------------+  PFV       Full                                                             +---------+---------------+---------+-----------+----------+--------------+  POP       Partial         Yes       Yes                    Chronic         +---------+---------------+---------+-----------+----------+--------------+  PTV       Full                                                             +---------+---------------+---------+-----------+----------+--------------+  PERO      Full                                                             +---------+---------------+---------+-----------+----------+--------------+     Summary: BILATERAL: - No evidence of superficial venous thrombosis in the lower extremities, bilaterally. -No evidence of popliteal cyst, bilaterally. RIGHT: - Findings consistent with age indeterminate non-occlusive deep vein thrombosis involving the right distal femoral vein.  LEFT: - Findings consistent with chronic non-occlusive deep vein thrombosis involving the left popliteal vein.  *See table(s) above for measurements and observations. Electronically signed by Jamelle Haring on 06/11/2021 at  10:22:38 AM.    Final    VAS [place] UPPER EXTREMITY VENOUS DUPLEX  Result Date: 06/11/2021 UPPER VENOUS STUDY  Patient Name:  Amber [name]  Date of Exam:   06/10/2021 Medical Rec #: [id number]      Accession #:    [id number] Date of Birth: [birth year]      Patient Gender: F Patient Age:   89 years Exam Location:  [facility] Procedure:      VAS [place] UPPER EXTREMITY VENOUS DUPLEX Referring Phys: [place] --------------------------------------------------------------------------------  Indications: Edema Limitations: Body habitus and poor ultrasound/tissue interface. Comparison Study: No previous exams Performing Technologist: Jody Hill RVT, RDMS  Examination Guidelines: A complete evaluation includes B-mode imaging, spectral Doppler, color Doppler, and power Doppler as needed of all accessible portions of each vessel. Bilateral testing is considered an integral part of a complete examination. Limited examinations for reoccurring indications may be performed as noted.  Right Findings: +-----+------------+---------+-----------+----------+-------+  RIGHT Compressible Phasicity Spontaneous Properties Summary  +-----+------------+---------+-----------+----------+-------+  IJV       Full        Yes        Yes                         +-----+------------+---------+-----------+----------+-------+  Left Findings: +----------+------------+---------+-----------+----------+---------------------+  LEFT       Compressible Phasicity Spontaneous Properties        Summary         +----------+------------+---------+-----------+----------+---------------------+  IJV  Full        Yes        Yes                                       +----------+------------+---------+-----------+----------+---------------------+  Subclavian     Full        Yes        Yes                                       +----------+------------+---------+-----------+----------+---------------------+  Axillary       Full        Yes        Yes                                        +----------+------------+---------+-----------+----------+---------------------+  Brachial       Full        Yes        Yes                 Only one of paired                                                               visualized on this                                                                      exam           +----------+------------+---------+-----------+----------+---------------------+  Radial         Full                                                             +----------+------------+---------+-----------+----------+---------------------+  Ulnar                                                    Not seen on this exam  +----------+------------+---------+-----------+----------+---------------------+  Cephalic       Full                                                             +----------+------------+---------+-----------+----------+---------------------+  Basilic        Full        Yes  Yes                 Not well visualized   +----------+------------+---------+-----------+----------+---------------------+  Summary:  Right: No evidence of thrombosis in the subclavian.  Left: No evidence of deep vein thrombosis in the upper extremity. No evidence of superficial vein thrombosis in the upper extremity. However, unable to visualize the ulnar veins. Subcutaneous edema seen in area of forearm and hand.  *See table(s) above for measurements and observations.  Diagnosing physician: Jamelle Haring Electronically signed by Jamelle Haring on 06/11/2021 at 10:23:24 AM.    Final      Vladimir Crofts  06/11/2021, 1:30 PM

## 2021-06-11 NOTE — TOC Progression Note (Signed)
Transition of Care [facility]) - Initial/Assessment Note    Patient Details  Name: Amber [name] MRN: [id number] Date of Birth: 18-Sep-1945  [facility]) CM/SW Contact:    Milinda Antis, [place] Phone Number: 06/11/2021, 12:54 PM  Clinical Narrative:                 CSW contacted [facility] [phone]) to inquire about the level of functioning (mobility and ADL's) the patient needs to have prior to admission.  There as no answer.  CSW left a VM requesting a returned call and will update MD and PT when information is received.         Patient Goals and CMS Choice        Expected Discharge Plan and Services                                                Prior Living Arrangements/Services                       Activities of Daily Living      Permission Sought/Granted                  Emotional Assessment              Admission diagnosis:  Hypernatremia [E87.0] Failure to thrive in adult [R62.7] AKI (acute kidney injury) ([place]) [N17.9] Sepsis, due to unspecified organism, unspecified whether acute organ dysfunction present [facility]) [A41.9] Patient Active Problem List   Diagnosis Date Noted   Major depressive disorder, recurrent episode, severe ([place]) 06/09/2021   Failure to thrive in adult 06/08/2021   Hypernatremia 06/08/2021   Primary hyperparathyroidism ([place]) 06/07/2021   Hyperthyroidism 04/25/2021   Encounter for examination following treatment at hospital 04/25/2021   Gastroesophageal reflux disease 04/25/2021   Gait abnormality 03/29/2021   Urinary incontinence 03/29/2021   Moderate protein-calorie malnutrition ([place]) 03/29/2021   Depression with anxiety 03/29/2021   Aortic atherosclerosis ([place]) 03/29/2021   Adnexal mass 03/29/2021   PCP:  Lindell Spar, MD Pharmacy:   [facility], [place] [address] Phone: [phone] Fax: [phone]     Social  Determinants of Health (SDOH) Interventions    Readmission Risk Interventions No flowsheet data found.

## 2021-06-11 NOTE — Progress Notes (Signed)
PROGRESS NOTE    NICKOL COLLISTER  CHE:[id number] DOB: 24-Mar-1946 DOA: 06/08/2021 PCP: Lindell Spar, MD   Brief Narrative/Hospital Course: [name], 76 y.o. 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  As per daughter patient started having swallowing problem for several weeks with dry heaves after eating solid food, episodes of confusion present hallucination and complaining of feeling something stuck in the throat when swallowing solid food and choked x2 and has refused to eat any solid food past week and mainly drinking water,  patient also complained "Has had enough and wanted to end her life".   According to PCPs note, patient was worked up recently for elevated creatinine levels, renal ultrasound showed slight left-sided hydronephrosis.  Pelvis ultrasound showed mass, for which patient was referred to see outpatient OB/GYN.  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  And patient was started on vitamin D supplement however family reported patient has not been compliant with any of her pills.  PCP also tried Remeron and Ranexa, and patient has not been compliant either In the ED was hypotensive tachycardic labs with lactic acidosis 6.1, elevated creatinine 1.8 hypernatremia 149 hypercalcemia 11.6.  Was given 2 L bolus LR, blood pressure improved, also placed on broad-spectrum antibiotics.  CT head chronic atrophy with white matter microvascular disease, chest x-ray no active disease Renal ultrasound 2.2 cm right renal calculus and right renal pain  with mild right-sided hydronephrosis. COVID-19 positive.Husband had covid 3 wks ago and patient  had sore throat and stuffy nose that time hre symptoms onset from October 2011, seen by neurology and had work  up.  Subjective: Seen and examined this morning.Resting comfortably no new complaints Alert awake Daughter at the bedside Patient feels all right no suicidal ideation, one-to-one remains in place.  Assessment & Plan:  Failure to thrive, mild confusion-mild acute metabolic encephalopathy: multifactorial in the setting of swallowing difficulties, COVID-19 infection. Speech , RD eval on board, patient not fully cooperative 1/14 but on 1/15 participating  well and mentation significantly improved.  Continue IV hydration, high-dose thiamine which seems to be helping. Continue PT OT.  She has had neuro work up and MRI for same and unrevealing. CT head in ED no acute finding, no obvious evidence of infection chest x-ray.UA leukocytes trace, WBC 6-10,, culture pending.  Dysphagia/choking sensation seen by speech, DG esophagogram pending.  Add PPI, continue antiemetics, supportive care.  Ongoing symptoms for 6 months  ? Functional vs real issue at times not participating. Will d/w GI.  Right distal femoral vein age indeterminate nonocclusive DVT Chronic nonocclusive DVT left popliteal vein Elevated D-dimer, in the setting of COVID: Placed on therapeutic Lovenox, VQ scan pending  AKI on CKDIIIb Dehydration  baseline creatinine 1.5, prerenal AKI 2/2 hypotension volume depletion from poor oral intake.  Resolved with IV fluid-wean off IV fluids. ultrasound with bilateral non-obstructive renal calculus and mild right-sided hydronephrosis.  Encourage oral intake when off IV fluids Recent Labs  Lab 06/08/21 1548 06/09/21 0301 06/10/21 0800 06/11/21 0606  BUN 43* 39* 27* 20  CREATININE 1.82* 1.43* 1.24* 1.12*    Lactic acidosis, metabolic acidosis: suspect due to hypotension dehydration, improving with IV fluids. afebrile no other evidence of infections beside COVID.Antibiotics on hold.   Hypernatremia: Resolved with IV fluids.    Hypercalcemia Secondary hyperparathyroidism  Vitamin D  deficiency: She has had recent work-up, calcium improved to 10> repeat stable, likely in the setting of volume epletion,Secondary to hyper parathyroidism from outpatient work-up but also had vitamin D deficiency, continue to replete.  Hypokalemia/hypophosphatemia/hypomagnesemia: Repleted aggressively and resolved.   Recent Labs  Lab 06/08/21 1548 06/08/21 2243 06/09/21 0301 06/10/21 0800 06/10/21 1520 06/11/21 0606  K 3.6  --  3.0* 2.5* 3.4* 4.2  CALCIUM 11.6*  --  10.0 10.0  --  8.4*  MG  --   --   --  1.4*  --  2.0  PHOS  --  1.6*  --  1.4*  --  2.6   Vague chest/abdomen pain:overnight EKG nonischemic changes, repeat troponin downtrending. Elevated troponin:Trop:96>101, ?demand ischemia due to tachycardia hypotension renal failure on presentation. TTE-EF 45-50%, no R WMA, G1 DD, normal RV systolic function size and normal pulmonary artery systolic pressure.  COVID-19 infection, high risk of decompensation, unvaccinated, continue remdesivir x3 days currently not hypoxic no respiratory symptoms.  CRP stable D-dimer elevated monitor D-dimer currently.   Swelling of left upper arm again uptrending duplex, keep it elevated.  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 Since mood appears improving, denies suicidal ideation will reengage psychiatry.  MBW:[id number] full code we discussed about involving palliative care to discuss CODE STATUS daughter is in agreement.At this time we will continue with full scope of treatment.  DVT prophylaxis: lovenox therapeutic dose. Code Status:   Code Status: Full Code Family Communication: plan of care discussed with patient and daughter at bedside. Status is: Inpatient Remains inpatient appropriate because: For ongoing management of failure to thrive Disposition: Currently not medically stable for  discharge. Anticipated Disposition: For now patient will need geriatric psychiatric until she is cleared by psych Once medically  improved.  Total time spent in the care of this patient  50  MINUTES Objective: Vitals last 24 hrs: Vitals:   06/10/21 2019 06/11/21 0512 06/11/21 0831 06/11/21 0900  BP: 127/88 (!) 145/68 138/66   Pulse: 83 63 66   Resp:  16 18   Temp: 97.6 F (36.4 C) 97.7 F (36.5 C) 98 F (36.7 C) 98 F (36.7 C)  TempSrc: Oral Oral Oral   SpO2: 100% 100% 98%   Weight:      Height:       Weight change:   Intake/Output Summary (Last 24 hours) at 06/11/2021 1104 Last data filed at 06/11/2021 0177 Gross per 24 hour  Intake 1665.79 ml  Output 426 ml  Net 1239.79 ml   Net IO Since Admission: 2,870.18 mL [06/11/21 1104]   Physical Examination: General exam:AAOx, elderly, not in distress, older than stated age, weak appearing. HEENT:Oral mucosa moist, Ear/Nose WNL grossly, dentition normal. Respiratory system: bilaterally clear, no use of accessory muscle Cardiovascular system: S1 & S2 +, No JVD,. Gastrointestinal system: Abdomen soft,NT,ND, BS+ Nervous System:Alert, awake, moving extremities and grossly nonfocal Extremities: No edema, distal peripheral pulses palpable.  Skin: No rashes,no icterus. MSK: Normal muscle bulk,tone, power   Medications reviewed:  Scheduled Meds:  enoxaparin (LOVENOX) injection  70 mg Subcutaneous Q12H   mirtazapine  15 mg Oral QHS   OLANZapine  5 mg Intramuscular Once   potassium chloride  20 mEq Oral Daily   sevelamer carbonate  800 mg Oral TID WC   sodium bicarbonate  650 mg Oral BID   [START ON 06/12/2021] thiamine  100 mg  Oral TID   Vitamin D (Ergocalciferol)  50,000 Units Oral Q7 days   Continuous Infusions:  dextrose 5 % and 0.45 % NaCl with KCl 20 mEq/L 75 mL/hr at 06/11/21 0145   remdesivir 100 mg in NS 100 mL 100 mg (06/11/21 1054)   thiamine injection 400 mg (06/11/21 5732)   Diet Order             Diet full  liquid Room service appropriate? No; Fluid consistency: Thin  Diet effective now                 Weight change:   Wt Readings from Last 3 Encounters:  06/09/21 71.4 kg  04/25/21 71.2 kg  04/24/21 70.8 kg     Consultants:see note  Procedures:see note Antimicrobials: Anti-infectives (From admission, onward)    Start     Dose/Rate Route Frequency Ordered Stop   06/10/21 2000  vancomycin (VANCOCIN) IVPB 1000 mg/200 mL premix  Status:  Discontinued        1,000 mg 200 mL/hr over 60 Minutes Intravenous Every 36 hours 06/08/21 1834 06/08/21 1843   06/10/21 1000  remdesivir 100 mg in sodium chloride 0.9 % 100 mL IVPB       See Hyperspace for full Linked Orders Report.   100 mg 200 mL/hr over 30 Minutes Intravenous Daily 06/09/21 1010 06/12/21 0959   06/09/21 1130  remdesivir 200 mg in sodium chloride 0.9% 250 mL IVPB       See Hyperspace for full Linked Orders Report.   200 mg 580 mL/hr over 30 Minutes Intravenous Once 06/09/21 1010 06/09/21 1224   06/09/21 0600  ceFEPIme (MAXIPIME) 2 g in sodium chloride 0.9 % 100 mL IVPB  Status:  Discontinued        2 g 200 mL/hr over 30 Minutes Intravenous Every 12 hours 06/08/21 1834 06/08/21 1843   06/08/21 1630  vancomycin (VANCOREADY) IVPB 1500 mg/300 mL  Status:  Discontinued        1,500 mg 150 mL/hr over 120 Minutes Intravenous  Once 06/08/21 1624 06/08/21 1843   06/08/21 1615  ceFEPIme (MAXIPIME) 2 g in sodium chloride 0.9 % 100 mL IVPB        2 g 200 mL/hr over 30 Minutes Intravenous  Once 06/08/21 1609 06/08/21 1819   06/08/21 1615  metroNIDAZOLE (FLAGYL) IVPB 500 mg        500 mg 100 mL/hr over 60 Minutes Intravenous  Once 06/08/21 1609 06/08/21 1841   06/08/21 1615  vancomycin (VANCOCIN) IVPB 1000 mg/200 mL premix  Status:  Discontinued        1,000 mg 200 mL/hr over 60 Minutes Intravenous  Once 06/08/21 1609 06/08/21 1624      Culture/Microbiology    Component Value Date/Time   SDES IN/OUT CATH URINE 06/09/2021 2318    [place] 06/09/2021 2318   CULT  06/09/2021 2318    CULTURE REINCUBATED FOR BETTER GROWTH Performed at [facility] [address], [place],  [address]    REPTSTATUS PENDING 06/09/2021 2318    Other culture-see note  Unresulted Labs (From admission, onward)     Start     Ordered   06/10/21 0500  Comprehensive metabolic panel  Daily,   R      06/09/21 0807   06/10/21 0500  CBC  Daily,   R      06/09/21 0807   06/08/21 1934  Calcium / creatinine ratio, urine  Once,   R  06/08/21 1933          Data Reviewed: I have personally reviewed following labs and imaging studies CBC: Recent Labs  Lab 06/08/21 1548 06/09/21 0301 06/10/21 0800 06/11/21 0606  WBC 9.7 8.6 5.3 4.4  NEUTROABS 6.3  --   --   --   HGB 12.7 9.9* 10.9* 9.7*  HCT 40.5 29.7* 33.5* 29.8*  MCV 94.2 90.8 89.8 92.8  PLT 206 120* 111* PLATELET CLUMPS NOTED ON SMEAR, UNABLE TO ESTIMATE   Basic Metabolic Panel: Recent Labs  Lab 06/08/21 1548 06/08/21 2243 06/09/21 0301 06/10/21 0800 06/10/21 1520 06/11/21 0606  NA 149*  --  143 147*  --  139  K 3.6  --  3.0* 2.5* 3.4* 4.2  CL 110  --  112* 112*  --  117*  CO2 15*  --  25 24  --  16*  GLUCOSE 165*  --  126* 67*  --  81  BUN 43*  --  39* 27*  --  20  CREATININE 1.82*  --  1.43* 1.24*  --  1.12*  CALCIUM 11.6*  --  10.0 10.0  --  8.4*  MG  --   --   --  1.4*  --  2.0  PHOS  --  1.6*  --  1.4*  --  2.6   GFR: Estimated Creatinine Clearance: 45.3 mL/min (A) (by C-G formula based on SCr of 1.12 mg/dL (H)). Liver Function Tests: Recent Labs  Lab 06/08/21 1548 06/10/21 0800 06/11/21 0606  AST 22 29 16   ALT 14 26 19   ALKPHOS 40 42 33*  BILITOT 2.0* 1.4* 1.0  PROT 6.4* 5.5* 4.2*  ALBUMIN 3.6 2.9* 2.2*   No results for input(s): LIPASE, AMYLASE in the last 168 hours. No results for input(s): AMMONIA in the last 168 hours. Coagulation Profile: Recent Labs  Lab 06/08/21 1529  INR 1.1   Cardiac Enzymes: Recent Labs   Lab 06/08/21 2243  CKTOTAL 13*   BNP (last 3 results) No results for input(s): PROBNP in the last 8760 hours. HbA1C: No results for input(s): HGBA1C in the last 72 hours. CBG: Recent Labs  Lab 06/08/21 1545  GLUCAP 156*   Lipid Profile: No results for input(s): CHOL, HDL, LDLCALC, TRIG, CHOLHDL, LDLDIRECT in the last 72 hours. Thyroid Function Tests: Recent Labs    06/09/21 1233  TSH 0.545   Anemia Panel: No results for input(s): VITAMINB12, FOLATE, FERRITIN, TIBC, IRON, RETICCTPCT in the last 72 hours. Sepsis Labs: Recent Labs  Lab 06/08/21 1549 06/08/21 2049 06/09/21 1028  PROCALCITON  --   --  0.16  LATICACIDVEN 6.4* 3.7*  --     Recent Results (from the past 240 hour(s))  Resp Panel by RT-PCR (Flu A&B, Covid) Nasopharyngeal Swab     Status: Abnormal   Collection Time: 06/08/21  4:08 PM   Specimen: Nasopharyngeal Swab; Nasopharyngeal(NP) swabs in vial transport medium  Result Value Ref Range Status   SARS Coronavirus 2 by RT PCR POSITIVE (A) NEGATIVE Final    Comment: (NOTE) SARS-CoV-2 target nucleic acids are DETECTED.  The SARS-CoV-2 RNA is generally detectable in upper respiratory specimens during the acute phase of infection. Positive results are indicative of the presence of the identified virus, but do not rule out bacterial infection or co-infection with other pathogens not detected by the test. Clinical correlation with patient history and other diagnostic information is necessary to determine patient infection status. The expected result is Negative.  Fact Sheet for Patients:  EntrepreneurPulse.com.au  Fact Sheet for Healthcare Providers: IncredibleEmployment.be  This test is not yet approved or cleared by the [place] FDA and  has been authorized for detection and/or diagnosis of SARS-CoV-2 by FDA under an Emergency Use Authorization (EUA).  This EUA will remain in effect (meaning this test can be used)  for the duration of  the COVID-19 declaration under Section 564(b)(1) of the A ct, 21 U.S.C. section 360bbb-3(b)(1), unless the authorization is terminated or revoked sooner.     Influenza A by PCR NEGATIVE NEGATIVE Final   Influenza B by PCR NEGATIVE NEGATIVE Final    Comment: (NOTE) The Xpert Xpress SARS-CoV-2/FLU/RSV plus assay is intended as an aid in the diagnosis of influenza from Nasopharyngeal swab specimens and should not be used as a sole basis for treatment. Nasal washings and aspirates are unacceptable for Xpert Xpress SARS-CoV-2/FLU/RSV testing.  Fact Sheet for Patients: EntrepreneurPulse.com.au  Fact Sheet for Healthcare Providers: IncredibleEmployment.be  This test is not yet approved or cleared by the [place] FDA and has been authorized for detection and/or diagnosis of SARS-CoV-2 by FDA under an Emergency Use Authorization (EUA). This EUA will remain in effect (meaning this test can be used) for the duration of the COVID-19 declaration under Section 564(b)(1) of the Act, 21 U.S.C. section 360bbb-3(b)(1), unless the authorization is terminated or revoked.  Performed at [facility], [place] [address] [place], [place] [address]   Blood culture (routine x 2)     Status: None (Preliminary result)   Collection Time: 06/08/21  4:45 PM   Specimen: BLOOD RIGHT HAND  Result Value Ref Range Status   Specimen Description BLOOD RIGHT HAND  Final   Special Requests   Final    BOTTLES DRAWN AEROBIC AND ANAEROBIC Blood Culture results may not be optimal due to an inadequate volume of blood received in culture bottles   Culture   Final    NO GROWTH 3 DAYS Performed at [facility], [place] [address] [place], [place] [address]    Report Status PENDING  Incomplete  Blood culture (routine x 2)     Status: None (Preliminary result)   Collection Time: 06/08/21  8:37 PM   Specimen: BLOOD RIGHT HAND  Result Value Ref Range  Status   Specimen Description BLOOD RIGHT HAND  Final   Special Requests   Final    BOTTLES DRAWN AEROBIC AND ANAEROBIC Blood Culture results may not be optimal due to an inadequate volume of blood received in culture bottles   Culture   Final    NO GROWTH 3 DAYS Performed at [facility], [place] [address] [place], [name] [address]    Report Status PENDING  Incomplete  Urine Culture     Status: None (Preliminary result)   Collection Time: 06/09/21 11:18 PM   Specimen: In/Out Cath Urine  Result Value Ref Range Status   Specimen Description IN/OUT CATH URINE  Final   Special Requests NONE  Final   Culture   Final    CULTURE REINCUBATED FOR BETTER GROWTH Performed at [facility], [place] [address] [place], [place] [address]    Report Status PENDING  Incomplete     Radiology Studies: DG Chest Port 1 View  Result Date: 06/11/2021 CLINICAL DATA:  Short of breath EXAM: PORTABLE CHEST 1 VIEW COMPARISON:  06/08/2021 FINDINGS: Normal cardiac silhouette with ectatic aorta. Mild linear markings in the lateral aspect of the LEFT lung. No pneumothorax. No pulmonary edema. No  acute osseous abnormality. IMPRESSION: Mild LEFT midlung atelectasis versus early infiltrate. Electronically Signed   By: Suzy Bouchard M.D.   On: 06/11/2021 08:34   VAS [place] LOWER EXTREMITY VENOUS (DVT)  Result Date: 06/11/2021  Lower Venous DVT Study Patient Name:  ISRAA CABAN  Date of Exam:   06/10/2021 Medical Rec #: [id number]      Accession #:    [id number] Date of Birth: 16-Dec-1945      Patient Gender: F Patient Age:   29 years Exam Location:  [facility] Procedure:      VAS [place] LOWER EXTREMITY VENOUS (DVT) Referring Phys: [place] --------------------------------------------------------------------------------  Indications: Edema.  Limitations: Poor ultrasound/tissue interface and body habitus. Comparison Study: No previous exams Performing Technologist: Jody Hill RVT, RDMS  Examination Guidelines: A  complete evaluation includes B-mode imaging, spectral Doppler, color Doppler, and power Doppler as needed of all accessible portions of each vessel. Bilateral testing is considered an integral part of a complete examination. Limited examinations for reoccurring indications may be performed as noted. The reflux portion of the exam is performed with the patient in reverse Trendelenburg.  +---------+---------------+---------+-----------+----------+-----------------+  RIGHT     Compressibility Phasicity Spontaneity Properties Thrombus Aging     +---------+---------------+---------+-----------+----------+-----------------+  CFV       Full            Yes       Yes                                       +---------+---------------+---------+-----------+----------+-----------------+  SFJ       Full                                                                +---------+---------------+---------+-----------+----------+-----------------+  FV Prox   Full            Yes       Yes                                       +---------+---------------+---------+-----------+----------+-----------------+  FV Mid    Full            Yes       Yes                                       +---------+---------------+---------+-----------+----------+-----------------+  FV Distal Partial         Yes       Yes                    Age Indeterminate  +---------+---------------+---------+-----------+----------+-----------------+  PFV       Full                                                                +---------+---------------+---------+-----------+----------+-----------------+  POP  Full            Yes       Yes                                       +---------+---------------+---------+-----------+----------+-----------------+  PTV       Full                                                                +---------+---------------+---------+-----------+----------+-----------------+  PERO      Full                                                                 +---------+---------------+---------+-----------+----------+-----------------+   +---------+---------------+---------+-----------+----------+--------------+  LEFT      Compressibility Phasicity Spontaneity Properties Thrombus Aging  +---------+---------------+---------+-----------+----------+--------------+  CFV       Full            Yes       Yes                                    +---------+---------------+---------+-----------+----------+--------------+  SFJ       Full                                                             +---------+---------------+---------+-----------+----------+--------------+  FV Prox   Full            Yes       Yes                                    +---------+---------------+---------+-----------+----------+--------------+  FV Mid    Full            Yes       Yes                                    +---------+---------------+---------+-----------+----------+--------------+  FV Distal Full            Yes       Yes                                    +---------+---------------+---------+-----------+----------+--------------+  PFV       Full                                                             +---------+---------------+---------+-----------+----------+--------------+  POP       Partial         Yes       Yes                    Chronic         +---------+---------------+---------+-----------+----------+--------------+  PTV       Full                                                             +---------+---------------+---------+-----------+----------+--------------+  PERO      Full                                                             +---------+---------------+---------+-----------+----------+--------------+     Summary: BILATERAL: - No evidence of superficial venous thrombosis in the lower extremities, bilaterally. -No evidence of popliteal cyst, bilaterally. RIGHT: - Findings consistent with age indeterminate non-occlusive deep vein thrombosis involving  the right distal femoral vein.  LEFT: - Findings consistent with chronic non-occlusive deep vein thrombosis involving the left popliteal vein.  *See table(s) above for measurements and observations. Electronically signed by Jamelle Haring on 06/11/2021 at 10:22:38 AM.    Final    VAS [place] UPPER EXTREMITY VENOUS DUPLEX  Result Date: 06/11/2021 UPPER VENOUS STUDY  Patient Name:  JOCELYN NOLD  Date of Exam:   06/10/2021 Medical Rec #: [id number]      Accession #:    [id number] Date of Birth: 05/16/1946      Patient Gender: F Patient Age:   38 years Exam Location:  [facility] Procedure:      VAS [place] UPPER EXTREMITY VENOUS DUPLEX Referring Phys: [place] --------------------------------------------------------------------------------  Indications: Edema Limitations: Body habitus and poor ultrasound/tissue interface. Comparison Study: No previous exams Performing Technologist: Jody Hill RVT, RDMS  Examination Guidelines: A complete evaluation includes B-mode imaging, spectral Doppler, color Doppler, and power Doppler as needed of all accessible portions of each vessel. Bilateral testing is considered an integral part of a complete examination. Limited examinations for reoccurring indications may be performed as noted.  Right Findings: +-----+------------+---------+-----------+----------+-------+  RIGHT Compressible Phasicity Spontaneous Properties Summary  +-----+------------+---------+-----------+----------+-------+  IJV       Full        Yes        Yes                         +-----+------------+---------+-----------+----------+-------+  Left Findings: +----------+------------+---------+-----------+----------+---------------------+  LEFT       Compressible Phasicity Spontaneous Properties        Summary         +----------+------------+---------+-----------+----------+---------------------+  IJV            Full        Yes        Yes                                        +----------+------------+---------+-----------+----------+---------------------+  Subclavian     Full  Yes        Yes                                       +----------+------------+---------+-----------+----------+---------------------+  Axillary       Full        Yes        Yes                                       +----------+------------+---------+-----------+----------+---------------------+  Brachial       Full        Yes        Yes                 Only one of paired                                                               visualized on this                                                                      exam           +----------+------------+---------+-----------+----------+---------------------+  Radial         Full                                                             +----------+------------+---------+-----------+----------+---------------------+  Ulnar                                                    Not seen on this exam  +----------+------------+---------+-----------+----------+---------------------+  Cephalic       Full                                                             +----------+------------+---------+-----------+----------+---------------------+  Basilic        Full        Yes        Yes                 Not well visualized   +----------+------------+---------+-----------+----------+---------------------+  Summary:  Right: No evidence of thrombosis in the subclavian.  Left: No evidence of deep vein thrombosis in the upper extremity. No evidence of superficial vein thrombosis in the upper extremity. However, unable to visualize the ulnar veins. Subcutaneous edema seen in area of forearm  and hand.  *See table(s) above for measurements and observations.  Diagnosing physician: Jamelle Haring Electronically signed by Jamelle Haring on 06/11/2021 at 10:23:24 AM.    Final    ECHOCARDIOGRAM LIMITED  Result Date: 06/09/2021    ECHOCARDIOGRAM LIMITED REPORT   Patient Name:    DERIKA ECKLES Date of Exam: 06/09/2021 Medical Rec #:  [id number]     Height:       66.0 in Accession #:    [id number]    Weight:       157.4 lb Date of Birth:  [birth year]     BSA:          1.806 m Patient Age:    38 years      BP:           133/86 mmHg Patient Gender: F             HR:           84 bpm. Exam Location:  Inpatient Procedure: Limited Echo, Limited Color Doppler and Cardiac Doppler Indications:   elevated troponin  History:       Patient has no prior history of Echocardiogram examinations.                Covid.  Sonographer:   Johny Chess RDCS Referring      [phone] Lequita Halt Phys: IMPRESSIONS  1. Left ventricular ejection fraction, by estimation, is 45 to 50%. The left ventricle has mildly decreased function. The left ventricle has no regional wall motion abnormalities. There is mild concentric left ventricular hypertrophy. Left ventricular diastolic parameters are consistent with Grade I diastolic dysfunction (impaired relaxation).  2. Right ventricular systolic function is normal. The right ventricular size is normal. There is normal pulmonary artery systolic pressure.  3. Mild mitral valve regurgitation.  4. Aortic valve regurgitation is not visualized.  5. Ectactic aorta. 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  6. The inferior vena cava is normal in size with greater than 50% respiratory variability, suggesting right atrial pressure of 3 mmHg. FINDINGS  Left Ventricle: Left ventricular ejection fraction, by estimation, is 45 to 50%. The left ventricle has mildly decreased function. The left ventricle has no regional wall motion abnormalities. There is mild concentric left ventricular hypertrophy. Abnormal (paradoxical) septal motion, consistent with left bundle branch block. Left ventricular diastolic parameters are consistent with Grade I diastolic dysfunction (impaired relaxation). Right Ventricle: The right ventricular size is normal. Right ventricular systolic function is  normal. There is normal pulmonary artery systolic pressure. The tricuspid regurgitant velocity is 2.16 m/s, and with an assumed right atrial pressure of 3 mmHg,  the estimated right ventricular systolic pressure is 28.7 mmHg. Mitral Valve: Mild mitral valve regurgitation. Tricuspid Valve: The tricuspid valve is not well visualized. Tricuspid valve regurgitation is not demonstrated. Aortic Valve: Aortic valve regurgitation is not visualized. Pulmonic Valve: The pulmonic valve was not well visualized. Pulmonic valve regurgitation is not visualized. Aorta: Ectactic aorta. Small ascending aortic aneurysm 3.9 cm. Venous: The inferior vena cava is normal in size with greater than 50% respiratory variability, suggesting right atrial pressure of 3 mmHg.  LV Volumes (MOD) LV vol d, MOD A2C: 94.6 ml Diastology LV vol s, MOD A2C: 55.5 ml LV e' medial:    4.79 cm/s LV SV MOD A2C:     39.1 ml LV E/e' medial:  10.6  LV e' lateral:   7.18 cm/s                            LV E/e' lateral: 7.0  IVC IVC diam: 1.40 cm AORTIC VALVE LVOT Vmax:   63.30 cm/s LVOT Vmean:  38.300 cm/s LVOT VTI:    0.093 m  AORTA Ao Asc diam: 3.90 cm MITRAL VALVE               TRICUSPID VALVE MV Area (PHT): 3.08 cm    TR Peak grad:   18.7 mmHg MV Decel Time: 246 msec    TR Vmax:        216.00 cm/s MV E velocity: 50.60 cm/s MV A velocity: 84.00 cm/s  SHUNTS MV E/A ratio:  0.60        Systemic VTI: 0.09 m Phineas Inches Electronically signed by Phineas Inches Signature Date/Time: 06/09/2021/1:54:37 PM    Final      LOS: 3 days   Antonieta Pert, MD [facility]  06/11/2021, 11:04 AM

## 2021-06-11 NOTE — Progress Notes (Signed)
Palliative:  HPI: 76 y.o. female  with past medical history of hypertension, CKD stage 3b, vitamin D deficiency, secondary hyperparathyroidism, urinary incontinence, left sided adnexal mass (referred to OB/GYN), recent failure to thrive admitted on 06/08/2021 with failure to thrive with signs of acute depression/anxiety leading to physical decline with symptoms reported of weight loss, swallowing difficulty, hallucinations, paranoia, confusion, and family report she has expressed wanting to end her life.    I met today at Ms. Sharron's bedside with daughter, Almyra Free. Ms. Preslar has had no improvement in her status and did not take her mirtazapine as we discussed yesterday. Today Ms. Schwanz is very clear that she is not interested in participating in her care including taking medications or eating. She becomes frustrated and tells me "I just want to rest - I don't think this is too much to ask." I attempted to reassess her suicidal ideation but she gives no clear answers to my questions.   I speak with daughter, Almyra Free, at bedside who is frustrated with her mother's status. Almyra Free expresses that she would understand her mother's wishes if she could explain and rationalize her feelings to her family. Almyra Free expresses hope that we can provide some medication that we can get into Ms. Vanhandel to try and break the cycle of her depression and decline. Almyra Free understands that this efforts would not be guaranteed to be successful but efforts should be made. I offered injection of Zyprexa today given she refuses all other medication while we touch base with psychiatry for further recommendations. Almyra Free agrees with plan.   Update: I did speak with Dr. Rosita Kea who recommends consideration of NGT/Cortak to provide medication and feeding as indicated. I have not previously discussed this with Ms. Bieker or her family. They wish to continue with mirtazapine qhs. If Ms. Martorano continues to refuse mirtazapine tonight and refuse intake I will  speak with them tomorrow regarding placement of NGT.   All questions/concerns addressed. Emotional support provided. Updated Dr. Lupita Leash.   Exam: Thin, frail. Lying in bed on side. Flat affect. Short responses. No distress.   Plan: - Depression/anxiety: Zyprexa IM 5 mg x 1 - Discussed with pharmacist on unit. Continue mirtazapine qhs - she does NOT have to swallow this pill but may dissolve under tongue. May need to consider NGT placement for medication/nutrition support if this aligns with patient and family wishes.  - If Ms. Chiem continues to refuse medication and refuses NGT placement then we will need to assistance from family to consider the ethical question of the respecting Ms. Mariner's wishes. We will need better understanding of how much of her wishes are aligned with her personal values vs her depressed state and acute illness clouding her judgement.   McGregor, NP Palliative Medicine Team Pager [phone] (Please see [email or web address] for schedule) Team Phone [phone]    Greater than 50%  of this time was spent counseling and coordinating care related to the above assessment and plan

## 2021-06-12 DIAGNOSIS — F332 Major depressive disorder, recurrent severe without psychotic features: Secondary | ICD-10-CM

## 2021-06-12 LAB — COMPREHENSIVE METABOLIC PANEL
ALT: 17 U/L (ref 0–44)
AST: 18 U/L (ref 15–41)
Albumin: 2.2 g/dL — ABNORMAL LOW (ref 3.5–5.0)
Alkaline Phosphatase: 37 U/L — ABNORMAL LOW (ref 38–126)
Anion gap: 10 (ref 5–15)
BUN: 16 mg/dL (ref 8–23)
CO2: 19 mmol/L — ABNORMAL LOW (ref 22–32)
Calcium: 9.4 mg/dL (ref 8.9–10.3)
Chloride: 114 mmol/L — ABNORMAL HIGH (ref 98–111)
Creatinine, Ser: 1.13 mg/dL — ABNORMAL HIGH (ref 0.44–1.00)
GFR, Estimated: 51 mL/min — ABNORMAL LOW (ref >60)
Glucose, Bld: 81 mg/dL (ref 70–99)
Potassium: 4.1 mmol/L (ref 3.5–5.1)
Sodium: 143 mmol/L (ref 135–145)
Total Bilirubin: 0.7 mg/dL (ref 0.3–1.2)
Total Protein: 4.2 g/dL — ABNORMAL LOW (ref 6.5–8.1)

## 2021-06-12 LAB — URINE CULTURE: Culture: 1000 — AB

## 2021-06-12 LAB — CBC
HCT: 31.5 % — ABNORMAL LOW (ref 36.0–46.0)
Hemoglobin: 10.3 g/dL — ABNORMAL LOW (ref 12.0–15.0)
MCH: 30.2 pg (ref 26.0–34.0)
MCHC: 32.7 g/dL (ref 30.0–36.0)
MCV: 92.4 fL (ref 80.0–100.0)
Platelets: 93 10*3/uL — ABNORMAL LOW (ref 150–400)
RBC: 3.41 MIL/uL — ABNORMAL LOW (ref 3.87–5.11)
RDW: 15 % (ref 11.5–15.5)
WBC: 4.9 10*3/uL (ref 4.0–10.5)
nRBC: 0 % (ref 0.0–0.2)

## 2021-06-12 NOTE — Plan of Care (Signed)
  Problem: Nutrition: Goal: Adequate nutrition will be maintained Outcome: Not Progressing   

## 2021-06-12 NOTE — Progress Notes (Signed)
PROGRESS NOTE    Amber [name]  TOI:[id number] DOB: 1945/05/31 DOA: 06/08/2021 PCP: Lindell Spar, MD   Brief Narrative/Hospital Course: Amber [name], 76 y.o. 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  As per daughter patient started having swallowing problem for several weeks with dry heaves after eating solid food, episodes of confusion present hallucination and complaining of feeling something stuck in the throat when swallowing solid food and choked x2 and has refused to eat any solid food past week and mainly drinking water,  patient also complained "Has had enough and wanted to end her life". Husband had covid 3 wks ago and patient  had sore throat and stuffy nose that time.Her  symptoms onset from October 2022, and daughter reports that patient was seen by neurology and had work up with MRI brain, MR venogram CT venogram November 04/24/21-no acute stroke, no dural venous sinus thrombosis.  According to PCPs note, patient was worked up recently for elevated creatinine levels, renal ultrasound showed slight left-sided hydronephrosis.  Pelvis ultrasound showed mass, for which patient was referred to see outpatient OB/GYN.  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  And patient was started on vitamin D supplement however family reported patient has not been compliant with any of her pills.  PCP also tried Remeron and Ranexa, and patient has not been compliant either In the ED was hypotensive tachycardic labs with lactic acidosis 6.1, elevated creatinine 1.8 hypernatremia 149 hypercalcemia 11.6.  Was given 2 L bolus LR, blood pressure improved, also placed on broad-spectrum antibiotics.  CT head chronic atrophy with white matter microvascular disease, chest x-ray no  active disease Renal ultrasound 2.2 cm right renal calculus and right renal pain  with mild right-sided hydronephrosis. COVID-19 positive.  Subjective: Seen and examined.  Daughter at the bedside-understand that patient has been refusing further testing/oral intake. Overnight patient was afebrile, Saturating well on room air. Labs shows stable creatinine improving bicarb, low plt at 93k Patient refused further work-up of her dysphagia was seen by GI yesterday Was able to take p.o. meds.    Assessment & Plan:  Failure to thrive with poor oral intake Dysphagia/choking sensation: seen by speech, DG esophagogram pending-patient has refused further work-up, seen by GI and signed off continue supportive care Protonix.planning to have family met with palliative care today since patient is refusing further work-up and also refusing to eat.   Mild confusion-mild acute metabolic encephalopathy:multifactorial in the setting of multiple medical comorbidities, dehydration, dysphagia,COVID-19 infection psych issues. Speech , RD eval on board, patient not fully cooperative. Per daughter mentation significantly improved.  Continue IV hydration, high-dose thiamine which seems to be helping. Continue PT OT.  She has had extensive neuro work up including MRI brain, MR venogram CT venogram 04/24/21 as outpatient-no acute stroke, no dural venous sinus thrombosis. CT head in ED no acute finding, no obvious evidence of infection chest x-ray.UA leukocytes trace, WBC 6-10,culture reincubated.   Right distal femoral vein age indeterminate nonocclusive DVT Chronic nonocclusive DVT left popliteal vein Elevated D-dimer, in the setting of COVID: Placed on therapeutic Lovenox, VQ scan pending-patient refusing.  Discussed with the daughter agreed for anticoagulation and will plan for Eliquis on discharge  AKI on CKDIIIb Dehydration  baseline creatinine 1.5, prerenal AKI 2/2 hypotension volume depletion from poor oral  intake.  ultrasound with bilateral non-obstructive renal calculus and mild right-sided hydronephrosis.  AKI improved.continue gentle IV fluids. Encourage oral intake. Recent Labs  Lab 06/08/21 1548 06/09/21 0301 06/10/21 0800 06/11/21 0606 06/12/21 0324  BUN 43* 39* 27* 20 16  CREATININE 1.82* 1.43* 1.24* 1.12* 1.13*     Lactic acidosis, metabolic acidosis: due to hypotension dehydration.  Hemodynamically stable and improving electrolytes.Antibiotics on hold-no obvious infection.   Hypernatremia: Resolved with IV fluids.    Hypercalcemia Secondary hyperparathyroidism Vitamin D deficiency: She has had recent work-up, calcium improved to 10> repeat stable, likely in the setting of volume epletion,Secondary to hyper parathyroidism from outpatient work-up but also had vitamin D deficiency, continue to replete.  Hypokalemia/hypophosphatemia/hypomagnesemia: Resolved after aggressive replacement  Recent Labs  Lab 06/08/21 1548 06/08/21 2243 06/09/21 0301 06/10/21 0800 06/10/21 1520 06/11/21 0606 06/12/21 0324  K 3.6  --  3.0* 2.5* 3.4* 4.2 4.1  CALCIUM 11.6*  --  10.0 10.0  --  8.4* 9.4  MG  --   --   --  1.4*  --  2.0  --   PHOS  --  1.6*  --  1.4*  --  2.6  --     Vague chest/abdomen pain:overnight EKG nonischemic changes, repeat troponin downtrending. Elevated troponin:Trop:96>101, ?demand ischemia due to tachycardia hypotension renal failure on presentation. TTE-EF 45-50%, no R WMA, G1 DD, normal RV systolic function size and normal pulmonary artery systolic pressure.  COVID-19 infection, high risk of decompensation, unvaccinated, continue remdesivir x3 days currently not hypoxic no respiratory symptoms.  CRP stable D-dimer elevated and downtrending.  On Lovenox for DVT.   Swelling of left upper arm no DVT on duplex.keep it elevated.  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  RRN:[id number] full code we discussed about involving palliative care to discuss CODE STATUS daughter is in agreement.At this time we will continue with full scope of treatment.  Palliative care is following closely, patient does not appear very compliant and refusing further work-up of her dysphagia.  Having family meeting with palliative care today.  DVT prophylaxis: lovenox therapeutic dose. Code Status:   Code Status: Full Code Family Communication: plan of care discussed with patient and daughter at bedside. Status is: Inpatient Remains inpatient appropriate because: For ongoing management of failure to thrive Disposition: Currently not medically stable for discharge. Anticipated Disposition: For now patient will need geriatric psychiatric until she is cleared by psych Once medically  improved.  Total time spent in the care of this patient  50  MINUTES Objective: Vitals last 24 hrs: Vitals:   06/11/21 1717 06/11/21 1738 06/11/21 2220 06/12/21 0542  BP: 125/86  132/87 120/89  Pulse: (!) 111 89 87 94  Resp: $Remo'18  16 18  'Ozkys$ Temp: 98.7 F (37.1 C)  97.8 F (36.6 C) 98.3 F (36.8 C)  TempSrc: Oral  Oral Oral  SpO2: 96%  99% 98%  Weight:      Height:       Weight change:   Intake/Output Summary (Last 24 hours) at 06/12/2021 0753 Last data filed at 06/11/2021 2020 Gross per 24 hour  Intake 60 ml  Output 825 ml  Net -765 ml    Net IO Since Admission: 2,300.18 mL [06/12/21 0753]   Physical Examination: General exam: AAO, elderly, not in distress, weak appearing. HEENT:Oral mucosa moist, Ear/Nose WNL grossly, dentition normal. Respiratory  system: bilaterally clear, no use of accessory muscle Cardiovascular system: S1 & S2 +, No JVD,. Gastrointestinal system: Abdomen soft,NT,ND, BS+ Nervous System:Alert, awake, moving extremities and grossly nonfocal Extremities: no edema, distal  peripheral pulses palpable.  Skin: No rashes,no icterus. MSK: Normal muscle bulk,tone, power   Medications reviewed:  Scheduled Meds:  enoxaparin (LOVENOX) injection  70 mg Subcutaneous Q12H   mirtazapine  15 mg Oral QHS   sevelamer carbonate  800 mg Oral TID WC   sodium bicarbonate  650 mg Oral BID   thiamine  100 mg Oral TID   Vitamin D (Ergocalciferol)  50,000 Units Oral Q7 days   Continuous Infusions:  dextrose 5 % and 0.45 % NaCl with KCl 20 mEq/L 30 mL/hr at 06/11/21 1624   Diet Order             Diet full liquid Room service appropriate? No; Fluid consistency: Thin  Diet effective now                 Weight change:   Wt Readings from Last 3 Encounters:  06/09/21 71.4 kg  04/25/21 71.2 kg  04/24/21 70.8 kg     Consultants:see note  Procedures:see note Antimicrobials: Anti-infectives (From admission, onward)    Start     Dose/Rate Route Frequency Ordered Stop   06/10/21 2000  vancomycin (VANCOCIN) IVPB 1000 mg/200 mL premix  Status:  Discontinued        1,000 mg 200 mL/hr over 60 Minutes Intravenous Every 36 hours 06/08/21 1834 06/08/21 1843   06/10/21 1000  remdesivir 100 mg in sodium chloride 0.9 % 100 mL IVPB       See Hyperspace for full Linked Orders Report.   100 mg 200 mL/hr over 30 Minutes Intravenous Daily 06/09/21 1010 06/11/21 1124   06/09/21 1130  remdesivir 200 mg in sodium chloride 0.9% 250 mL IVPB       See Hyperspace for full Linked Orders Report.   200 mg 580 mL/hr over 30 Minutes Intravenous Once 06/09/21 1010 06/09/21 1224   06/09/21 0600  ceFEPIme (MAXIPIME) 2 g in sodium chloride 0.9 % 100 mL IVPB  Status:  Discontinued        2 g 200 mL/hr over 30 Minutes Intravenous Every 12 hours 06/08/21 1834 06/08/21 1843   06/08/21 1630  vancomycin (VANCOREADY) IVPB 1500 mg/300 mL  Status:  Discontinued        1,500 mg 150 mL/hr over 120 Minutes Intravenous  Once 06/08/21 1624 06/08/21 1843   06/08/21 1615  ceFEPIme (MAXIPIME) 2 g in sodium  chloride 0.9 % 100 mL IVPB        2 g 200 mL/hr over 30 Minutes Intravenous  Once 06/08/21 1609 06/08/21 1819   06/08/21 1615  metroNIDAZOLE (FLAGYL) IVPB 500 mg        500 mg 100 mL/hr over 60 Minutes Intravenous  Once 06/08/21 1609 06/08/21 1841   06/08/21 1615  vancomycin (VANCOCIN) IVPB 1000 mg/200 mL premix  Status:  Discontinued        1,000 mg 200 mL/hr over 60 Minutes Intravenous  Once 06/08/21 1609 06/08/21 1624      Culture/Microbiology    Component Value Date/Time   SDES IN/OUT CATH URINE 06/09/2021 2318   [place] 06/09/2021 2318   CULT  06/09/2021 2318    CULTURE REINCUBATED FOR BETTER GROWTH Performed at [facility] [address], [place], [place] [address]    REPTSTATUS PENDING 06/09/2021 2318    Other  culture-see note  Unresulted Labs (From admission, onward)    None     Data Reviewed: I have personally reviewed following labs and imaging studies CBC: Recent Labs  Lab 06/08/21 1548 06/09/21 0301 06/10/21 0800 06/11/21 0606 06/12/21 0324  WBC 9.7 8.6 5.3 4.4 4.9  NEUTROABS 6.3  --   --   --   --   HGB 12.7 9.9* 10.9* 9.7* 10.3*  HCT 40.5 29.7* 33.5* 29.8* 31.5*  MCV 94.2 90.8 89.8 92.8 92.4  PLT 206 120* 111* PLATELET CLUMPS NOTED ON SMEAR, UNABLE TO ESTIMATE 93*    Basic Metabolic Panel: Recent Labs  Lab 06/08/21 1548 06/08/21 2243 06/09/21 0301 06/10/21 0800 06/10/21 1520 06/11/21 0606 06/12/21 0324  NA 149*  --  143 147*  --  139 143  K 3.6  --  3.0* 2.5* 3.4* 4.2 4.1  CL 110  --  112* 112*  --  117* 114*  CO2 15*  --  25 24  --  16* 19*  GLUCOSE 165*  --  126* 67*  --  81 81  BUN 43*  --  39* 27*  --  20 16  CREATININE 1.82*  --  1.43* 1.24*  --  1.12* 1.13*  CALCIUM 11.6*  --  10.0 10.0  --  8.4* 9.4  MG  --   --   --  1.4*  --  2.0  --   PHOS  --  1.6*  --  1.4*  --  2.6  --     GFR: Estimated Creatinine Clearance: 44.9 mL/min (A) (by C-G formula based on SCr of 1.13 mg/dL (H)). Liver Function Tests: Recent  Labs  Lab 06/08/21 1548 06/10/21 0800 06/11/21 0606 06/12/21 0324  AST $Re'22 29 16 18  'ACv$ ALT $R'14 26 19 17  'FV$ ALKPHOS 40 42 33* 37*  BILITOT 2.0* 1.4* 1.0 0.7  PROT 6.4* 5.5* 4.2* 4.2*  ALBUMIN 3.6 2.9* 2.2* 2.2*    No results for input(s): LIPASE, AMYLASE in the last 168 hours. No results for input(s): AMMONIA in the last 168 hours. Coagulation Profile: Recent Labs  Lab 06/08/21 1529  INR 1.1    Cardiac Enzymes: Recent Labs  Lab 06/08/21 2243  CKTOTAL 13*    BNP (last 3 results) No results for input(s): PROBNP in the last 8760 hours. HbA1C: No results for input(s): HGBA1C in the last 72 hours. CBG: Recent Labs  Lab 06/08/21 1545  GLUCAP 156*    Lipid Profile: No results for input(s): CHOL, HDL, LDLCALC, TRIG, CHOLHDL, LDLDIRECT in the last 72 hours. Thyroid Function Tests: Recent Labs    06/09/21 1233  TSH 0.545    Anemia Panel: No results for input(s): VITAMINB12, FOLATE, FERRITIN, TIBC, IRON, RETICCTPCT in the last 72 hours. Sepsis Labs: Recent Labs  Lab 06/08/21 1549 06/08/21 2049 06/09/21 1028  PROCALCITON  --   --  0.16  LATICACIDVEN 6.4* 3.7*  --      Recent Results (from the past 240 hour(s))  Resp Panel by RT-PCR (Flu A&B, Covid) Nasopharyngeal Swab     Status: Abnormal   Collection Time: 06/08/21  4:08 PM   Specimen: Nasopharyngeal Swab; Nasopharyngeal(NP) swabs in vial transport medium  Result Value Ref Range Status   SARS Coronavirus 2 by RT PCR POSITIVE (A) NEGATIVE Final    Comment: (NOTE) SARS-CoV-2 target nucleic acids are DETECTED.  The SARS-CoV-2 RNA is generally detectable in upper respiratory specimens during the acute phase of infection. Positive results are indicative of the presence  of the identified virus, but do not rule out bacterial infection or co-infection with other pathogens not detected by the test. Clinical correlation with patient history and other diagnostic information is necessary to determine patient infection  status. The expected result is Negative.  Fact Sheet for Patients: EntrepreneurPulse.com.au  Fact Sheet for Healthcare Providers: IncredibleEmployment.be  This test is not yet approved or cleared by the [place] FDA and  has been authorized for detection and/or diagnosis of SARS-CoV-2 by FDA under an Emergency Use Authorization (EUA).  This EUA will remain in effect (meaning this test can be used) for the duration of  the COVID-19 declaration under Section 564(b)(1) of the A ct, 21 U.S.C. section 360bbb-3(b)(1), unless the authorization is terminated or revoked sooner.     Influenza A by PCR NEGATIVE NEGATIVE Final   Influenza B by PCR NEGATIVE NEGATIVE Final    Comment: (NOTE) The Xpert Xpress SARS-CoV-2/FLU/RSV plus assay is intended as an aid in the diagnosis of influenza from Nasopharyngeal swab specimens and should not be used as a sole basis for treatment. Nasal washings and aspirates are unacceptable for Xpert Xpress SARS-CoV-2/FLU/RSV testing.  Fact Sheet for Patients: EntrepreneurPulse.com.au  Fact Sheet for Healthcare Providers: IncredibleEmployment.be  This test is not yet approved or cleared by the [place] FDA and has been authorized for detection and/or diagnosis of SARS-CoV-2 by FDA under an Emergency Use Authorization (EUA). This EUA will remain in effect (meaning this test can be used) for the duration of the COVID-19 declaration under Section 564(b)(1) of the Act, 21 U.S.C. section 360bbb-3(b)(1), unless the authorization is terminated or revoked.  Performed at [facility], [place] [address] [place], [place] [address]   Blood culture (routine x 2)     Status: None (Preliminary result)   Collection Time: 06/08/21  4:45 PM   Specimen: BLOOD RIGHT HAND  Result Value Ref Range Status   Specimen Description BLOOD RIGHT HAND  Final   Special Requests   Final    BOTTLES  DRAWN AEROBIC AND ANAEROBIC Blood Culture results may not be optimal due to an inadequate volume of blood received in culture bottles   Culture   Final    NO GROWTH 3 DAYS Performed at [facility], [place] [address], [place], [place] [address]    Report Status PENDING  Incomplete  Blood culture (routine x 2)     Status: None (Preliminary result)   Collection Time: 06/08/21  8:37 PM   Specimen: BLOOD RIGHT HAND  Result Value Ref Range Status   Specimen Description BLOOD RIGHT HAND  Final   Special Requests   Final    BOTTLES DRAWN AEROBIC AND ANAEROBIC Blood Culture results may not be optimal due to an inadequate volume of blood received in culture bottles   Culture   Final    NO GROWTH 3 DAYS Performed at [facility], [place] [address] [place], [place] [address]    Report Status PENDING  Incomplete  Urine Culture     Status: None (Preliminary result)   Collection Time: 06/09/21 11:18 PM   Specimen: In/Out Cath Urine  Result Value Ref Range Status   Specimen Description IN/OUT CATH URINE  Final   Special Requests NONE  Final   Culture   Final    CULTURE REINCUBATED FOR BETTER GROWTH Performed at [facility], [place] [address], [place], [place] [address]    Report Status PENDING  Incomplete      Radiology  Studies: DG Chest Port 1 View  Result Date: 06/11/2021 CLINICAL DATA:  Short of breath EXAM: PORTABLE CHEST 1 VIEW COMPARISON:  06/08/2021 FINDINGS: Normal cardiac silhouette with ectatic aorta. Mild linear markings in the lateral aspect of the LEFT lung. No pneumothorax. No pulmonary edema. No acute osseous abnormality. IMPRESSION: Mild LEFT midlung atelectasis versus early infiltrate. Electronically Signed   By: Suzy Bouchard M.D.   On: 06/11/2021 08:34   VAS [place] LOWER EXTREMITY VENOUS (DVT)  Result Date: 06/11/2021  Lower Venous DVT Study Patient Name:  Amber [name]  Date of Exam:   06/10/2021 Medical Rec #: [id number]      Accession #:    [id number] Date of  Birth: Mar 20, 1946      Patient Gender: F Patient Age:   55 years Exam Location:  [facility] Procedure:      VAS [place] LOWER EXTREMITY VENOUS (DVT) Referring Phys: [place] --------------------------------------------------------------------------------  Indications: Edema.  Limitations: Poor ultrasound/tissue interface and body habitus. Comparison Study: No previous exams Performing Technologist: Jody Hill RVT, RDMS  Examination Guidelines: A complete evaluation includes B-mode imaging, spectral Doppler, color Doppler, and power Doppler as needed of all accessible portions of each vessel. Bilateral testing is considered an integral part of a complete examination. Limited examinations for reoccurring indications may be performed as noted. The reflux portion of the exam is performed with the patient in reverse Trendelenburg.  +---------+---------------+---------+-----------+----------+-----------------+  RIGHT     Compressibility Phasicity Spontaneity Properties Thrombus Aging     +---------+---------------+---------+-----------+----------+-----------------+  CFV       Full            Yes       Yes                                       +---------+---------------+---------+-----------+----------+-----------------+  SFJ       Full                                                                +---------+---------------+---------+-----------+----------+-----------------+  FV Prox   Full            Yes       Yes                                       +---------+---------------+---------+-----------+----------+-----------------+  FV Mid    Full            Yes       Yes                                       +---------+---------------+---------+-----------+----------+-----------------+  FV Distal Partial         Yes       Yes                    Age Indeterminate  +---------+---------------+---------+-----------+----------+-----------------+  PFV       Full                                                                 +---------+---------------+---------+-----------+----------+-----------------+  POP       Full            Yes       Yes                                       +---------+---------------+---------+-----------+----------+-----------------+  PTV       Full                                                                +---------+---------------+---------+-----------+----------+-----------------+  PERO      Full                                                                +---------+---------------+---------+-----------+----------+-----------------+   +---------+---------------+---------+-----------+----------+--------------+  LEFT      Compressibility Phasicity Spontaneity Properties Thrombus Aging  +---------+---------------+---------+-----------+----------+--------------+  CFV       Full            Yes       Yes                                    +---------+---------------+---------+-----------+----------+--------------+  SFJ       Full                                                             +---------+---------------+---------+-----------+----------+--------------+  FV Prox   Full            Yes       Yes                                    +---------+---------------+---------+-----------+----------+--------------+  FV Mid    Full            Yes       Yes                                    +---------+---------------+---------+-----------+----------+--------------+  FV Distal Full            Yes       Yes                                    +---------+---------------+---------+-----------+----------+--------------+  PFV       Full                                                             +---------+---------------+---------+-----------+----------+--------------+  POP       Partial         Yes       Yes                    Chronic         +---------+---------------+---------+-----------+----------+--------------+  PTV       Full                                                              +---------+---------------+---------+-----------+----------+--------------+  PERO      Full                                                             +---------+---------------+---------+-----------+----------+--------------+     Summary: BILATERAL: - No evidence of superficial venous thrombosis in the lower extremities, bilaterally. -No evidence of popliteal cyst, bilaterally. RIGHT: - Findings consistent with age indeterminate non-occlusive deep vein thrombosis involving the right distal femoral vein.  LEFT: - Findings consistent with chronic non-occlusive deep vein thrombosis involving the left popliteal vein.  *See table(s) above for measurements and observations. Electronically signed by Heath Lark on 06/11/2021 at 10:22:38 AM.    Final    VAS [place] UPPER EXTREMITY VENOUS DUPLEX  Result Date: 06/11/2021 UPPER VENOUS STUDY  Patient Name:  Amber [name]  Date of Exam:   06/10/2021 Medical Rec #: [id number]      Accession #:    [id number] Date of Birth: 06-09-1945      Patient Gender: F Patient Age:   77 years Exam Location:  [facility] Procedure:      VAS [place] UPPER EXTREMITY VENOUS DUPLEX Referring Phys: Sandhya Denherder --------------------------------------------------------------------------------  Indications: Edema Limitations: Body habitus and poor ultrasound/tissue interface. Comparison Study: No previous exams Performing Technologist: Jody Hill RVT, RDMS  Examination Guidelines: A complete evaluation includes B-mode imaging, spectral Doppler, color Doppler, and power Doppler as needed of all accessible portions of each vessel. Bilateral testing is considered an integral part of a complete examination. Limited examinations for reoccurring indications may be performed as noted.  Right Findings: +-----+------------+---------+-----------+----------+-------+  RIGHT Compressible Phasicity Spontaneous Properties Summary  +-----+------------+---------+-----------+----------+-------+  IJV       Full        Yes         Yes                         +-----+------------+---------+-----------+----------+-------+  Left Findings: +----------+------------+---------+-----------+----------+---------------------+  LEFT       Compressible Phasicity Spontaneous Properties        Summary         +----------+------------+---------+-----------+----------+---------------------+  IJV            Full        Yes        Yes                                       +----------+------------+---------+-----------+----------+---------------------+  Subclavian     Full  Yes        Yes                                       +----------+------------+---------+-----------+----------+---------------------+  Axillary       Full        Yes        Yes                                       +----------+------------+---------+-----------+----------+---------------------+  Brachial       Full        Yes        Yes                 Only one of paired                                                               visualized on this                                                                      exam           +----------+------------+---------+-----------+----------+---------------------+  Radial         Full                                                             +----------+------------+---------+-----------+----------+---------------------+  Ulnar                                                    Not seen on this exam  +----------+------------+---------+-----------+----------+---------------------+  Cephalic       Full                                                             +----------+------------+---------+-----------+----------+---------------------+  Basilic        Full        Yes        Yes                 Not well visualized   +----------+------------+---------+-----------+----------+---------------------+  Summary:  Right: No evidence of thrombosis in the subclavian.  Left: No evidence of deep vein thrombosis in the upper extremity. No  evidence of superficial vein thrombosis in the upper extremity. However, unable to visualize the ulnar veins. Subcutaneous edema seen in area of  forearm and hand.  *See table(s) above for measurements and observations.  Diagnosing physician: Jamelle Haring Electronically signed by Jamelle Haring on 06/11/2021 at 10:23:24 AM.    Final      LOS: 4 days   Antonieta Pert, MD [facility]  06/12/2021, 7:53 AM

## 2021-06-12 NOTE — Progress Notes (Signed)
Palliative:  HPI: 76 y.o. female  with past medical history of hypertension, CKD stage 3b, vitamin D deficiency, secondary hyperparathyroidism, urinary incontinence, left sided adnexal mass (referred to OB/GYN), recent failure to thrive admitted on 06/08/2021 with failure to thrive with signs of acute depression/anxiety leading to physical decline with symptoms reported of weight loss, swallowing difficulty, hallucinations, paranoia, confusion, and family report she has expressed wanting to end her life.     Amber [name] is resting in bed. She continues to refuse medications or participate in her care. I reached out to family to coordinate goals of care meeting.  I met today with Amber [name] husband, 2 daughters, son-in-law in person as well as her son and sister via telephone. Family give me background to long history of mostly untreated psychiatric illness mostly depression. She was under the care of a psychiatrist when her children were young. They explain that she had a difficult and traumatic childhood leaving her with expressed feeling of abandonment and mistrusting of people. They express episodes over the years of depression but they have not presented as Amber [name] does at this time. They explain that she really became paranoid at the beginning of the pandemic (she was very fearful as she was a heavy smoker) and this led to her being more isolated from people and even her own family.   They express that she has really taken a significant decline over the past 2 months when she has become increasingly paranoid, worsening hallucinations, declining oral intake, becoming bedridden. They report that at some point in the past 2 months she seemed to decide that she was unable to walk and unable to swallow. She would not get up to go to the restroom and they were using adult diapers. They discovered that she was actually throwing away food that was being brought to her in her room that she refused to leave. She  ultimately stopped even drinking water. They also found medication thrown away and in tissues when they thought she was taking but she was not. When family did not know how else to help her they brought her to the hospital. They also describes some different behaviors such as counting her fingers the amount of words in a questions and if landing on an odd number then the answer was no (such as - Will I be able to eat today - is 7 words and odd so the answer would be no). They express that she would write down each hour and half hour and would sometimes draw pictures or write things down and they could never get any rationale or explanation from her for these behaviors.   We discussed goals of care in context of her current status and overall values of the person they know. Family wishes for trial of aggressive care and interventions including work up of any medical conditions that could be leading to decline (adnexal mass??), temporary feeding tube (they would not want long term artificial feeding - this is stated in her Living Will that I reviewed), and inpatient psychiatric treatment including ECT if indicated. They want to see if trial of aggressive care will provide her with improvement in her status. They do understand that she has overall failure to thrive and there is the chance this is may not reversible. They do not feel that she should be able to refuse care in her current state (I tend to agree). We did discuss code status and they do all agree that given her frail  state if she were to decline to the point of requiring resuscitative efforts that this would not best for her and they all agree with DNR status.   I do not feel that Amber [name] has capacity to refuse interventions given she has consistently been unable to provide me with any rationale for her decisions to refuse medications and care, she has not been able to describe the outcome or consequences of her decisions but "I just want to rest" and  she does not answer when I attempt to assess for any suicidal thoughts. She has difficulty focusing on topic and become fixated on certain medications/pills or elements of her care that are not relevant to conversation/decision at hand. Unable to explain the purpose or consequences of any interventions she has refused so far even though her choices have been consistent. She has so far lacked understanding, appreciation, and reasoning to make informed decisions to refuse medications and nutrition. Family wishes to proceed with treatment to ensure she receives medication and nutrition.   All questions/concerns addressed. Emotional support provided.   Exam: Resting. Does not cooperate with questions and interview. No distress. Thin, frail. Breathing regular, unlabored. Abd flat.   Plan: - DNR - Trial of aggressive treatment and interventions - NGT for temporary nutrition and medication support.  - Copy of HCPOA and Living Will reviewed and placed on shadow chart  [id number] 847 min  Vinie Sill, NP Palliative Medicine Team Pager [phone] (Please see [email or web address] for schedule) Team Phone [phone]    Greater than 50%  of this time was spent counseling and coordinating care related to the above assessment and plan

## 2021-06-13 ENCOUNTER — Other Ambulatory Visit (HOSPITAL_COMMUNITY): Payer: Self-pay

## 2021-06-13 LAB — CULTURE, BLOOD (ROUTINE X 2)
Culture: NO GROWTH
Culture: NO GROWTH

## 2021-06-13 MED ORDER — OSMOLITE 1.2 CAL PO LIQD
1000.0000 mL | ORAL | Status: DC
  Filled 2021-06-13 (×2): qty 1000

## 2021-06-13 MED ORDER — HALOPERIDOL LACTATE 5 MG/ML IJ SOLN
0.5000 mg | Freq: Every day | INTRAMUSCULAR | Status: DC
  Administered 2021-06-14 – 2021-06-18 (×4): 0.5 mg via INTRAVENOUS
  Filled 2021-06-13 (×6): qty 1

## 2021-06-13 NOTE — TOC Progression Note (Signed)
Transition of Care [facility]) - Initial/Assessment Note    Patient Details  Name: Amber [name] MRN: [id number] Date of Birth: [birth year]  Transition of Care [facility] CM/SW Contact:    Milinda Antis, [place] Phone Number: 06/13/2021, 4:37 PM  Clinical Narrative:                 CSW reviewed patient's chart.  Patient continues not to eat or take medications and declines care.  Current plan remains geriatric psych at discharged.  CSW will send referral to psych hospitals when patient is medically ready as facilities will not review until then.          Patient Goals and CMS Choice        Expected Discharge Plan and Services                                                Prior Living Arrangements/Services                       Activities of Daily Living      Permission Sought/Granted                  Emotional Assessment              Admission diagnosis:  Hypernatremia [E87.0] Failure to thrive in adult [R62.7] AKI (acute kidney injury) ([place]) [N17.9] Sepsis, due to unspecified organism, unspecified whether acute organ dysfunction present [facility]) [A41.9] Patient Active Problem List   Diagnosis Date Noted   Dysphagia    COVID    Major depressive disorder, recurrent episode, severe ([place]) 06/09/2021   Failure to thrive in adult 06/08/2021   Hypernatremia 06/08/2021   Primary hyperparathyroidism ([place]) 06/07/2021   Hyperthyroidism 04/25/2021   Encounter for examination following treatment at hospital 04/25/2021   Gastroesophageal reflux disease 04/25/2021   Gait abnormality 03/29/2021   Urinary incontinence 03/29/2021   Moderate protein-calorie malnutrition ([place]) 03/29/2021   Depression with anxiety 03/29/2021   Aortic atherosclerosis ([place]) 03/29/2021   Adnexal mass 03/29/2021   PCP:  Lindell Spar, MD Pharmacy:   [facility], [address] [place] [address] Phone: [phone] Fax:  [phone]     Social Determinants of Health (SDOH) Interventions    Readmission Risk Interventions No flowsheet data found.

## 2021-06-13 NOTE — Progress Notes (Signed)
PT Cancellation Note  Patient Details Name: Amber [name] MRN: [id number] DOB: [birth year]   Cancelled Treatment:    Reason Eval/Treat Not Completed: Patient declined, no reason specified. Pt reports dizziness at rest and with mobility. Also reports a fear of falling. Declines all mobility or exercise. Pt does appear to have gaze-induced nystagmus. PT provides encouragement for participation in vestibular evaluation however pt declines. PT will attempt to follow up tomorrow.   Zenaida Niece 06/13/2021, 4:00 PM

## 2021-06-13 NOTE — Progress Notes (Signed)
PROGRESS NOTE    Amber [name]  YHC:[id number] DOB: Oct 18, 1945 DOA: 06/08/2021 PCP: Amber Spar, MD   Brief Narrative/Hospital Course: Amber [name], 76 y.o. 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  As per daughter patient started having swallowing problem for several weeks with dry heaves after eating solid food, episodes of confusion present hallucination and complaining of feeling something stuck in the throat when swallowing solid food and choked x2 and Amber refused to eat any solid food past week and mainly drinking water,  patient also complained "Amber [name] and wanted to end her life". Husband had covid 3 wks ago and patient  had sore throat and stuffy nose that time.Her  symptoms onset from October 2022, and daughter reports that patient was seen by neurology and had work up with MRI brain, MR venogram CT venogram November 04/24/21-no acute stroke, no dural venous sinus thrombosis.  According to PCPs note, patient was worked up recently for elevated creatinine levels, renal ultrasound showed slight left-sided hydronephrosis.  Pelvis ultrasound showed mass, for which patient was referred to see outpatient OB/GYN.  Also, PCP Amber worked up for hypercalcemia, work-up showed significant elevation of PTH (probably tertiary hyperparathyroidism secondary to CKD) and severe vitamin D deficiency.  And patient was started on vitamin D supplement however family reported patient Amber not been compliant with any of her pills.  PCP also tried Remeron and Ranexa, and patient Amber not been compliant either In the ED was hypotensive tachycardic labs with lactic acidosis 6.1, elevated creatinine 1.8 hypernatremia 149 hypercalcemia 11.6.  Was given 2 L bolus LR, blood pressure improved, also placed on broad-spectrum antibiotics.  CT head chronic atrophy with white matter microvascular disease, chest x-ray no  active disease Renal ultrasound 2.2 cm right renal calculus and right renal pain  with mild right-sided hydronephrosis. COVID-19 positive.  Subjective: Seen and examined this morning no family at the bedside She is alert awake oriented to self current place and situation. She is refusing to take medication her for further care.  Reports she is not able to walk When I mention her ABT  physical therapy says she does not want to work with THEM Patient afebrile overnight Vitals stable Did take her Remeron last night.  Assessment & Plan:  Failure to thrive with poor oral intake Dysphagia/choking sensation: seen by speech, DG esophagogram pending-patient Amber refused further work-up, seen by GI and signed off continue supportive care Protonix, nutritional supplement, gentle hydration.  Patient is refusing to take p.o. or participating in care at this time.  Palliative care meeting convened yesterday awaiting psychiatry input.   Mild confusion-mild acute metabolic encephalopathy:multifactorial in the setting of multiple medical comorbidities, dehydration, dysphagia,COVID-19 infection psych issues. Speech , RD eval on board, patient not fully cooperative-Amber been refusing to participate in care and refusing to eat at this time mental status is stable and improved alert and oriented to self place date situation.  Continue IV hydration, high-dose thiamine.She Amber had extensive neuro work up including MRI brain, MR venogram CT venogram 04/24/21 as outpatient-no acute stroke, no dural venous sinus thrombosis. CT head in ED no acute finding, no obvious evidence of infection chest x-ray.UA leukocytes trace, WBC 6-10,culture reincubated.    Depression with suicidal ideation:seen by psychiatry ,cont Remeron at bedtime, continue one-to-one sitter will need geripsych placement per psychiatry who are following closely.  Patient refusing to answer questions.patient is refusing  further care await psychiatric input  for capacity evaluation.  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  Right distal femoral vein age indeterminate nonocclusive DVT Chronic nonocclusive DVT left popliteal vein Elevated D-dimer, in the setting of COVID: Placed on therapeutic Lovenox, VQ scan pending-patient refusing.  Discussed with the daughter agreed for anticoagulation and will plan for Amber [name] on discharge-but she does not have insurance.  AKI on CKDIIIb Dehydration  baseline creatinine 1.5, prerenal AKI 2/2 hypotension volume depletion from poor oral intake.  ultrasound with bilateral non-obstructive renal calculus and mild right-sided hydronephrosis.  AKI improved.continue gentle IV fluids. Encourage oral intake. Recent Labs  Lab 06/08/21 1548 06/09/21 0301 06/10/21 0800 06/11/21 0606 06/12/21 0324  BUN 43* 39* 27* 20 16  CREATININE 1.82* 1.43* 1.24* 1.12* 1.13*     Lactic acidosis, metabolic acidosis: due to hypotension dehydration po oral intake. Hypernatremia: Resolved with IV fluids.    Hypercalcemia Secondary hyperparathyroidism Vitamin D deficiency: She Amber had recent work-up, calcium improved to 10> repeat stable, likely in the setting of volume epletion,Secondary to hyper parathyroidism from outpatient work-up but also had vitamin D deficiency, continue to replete.  Hypokalemia/hypophosphatemia/hypomagnesemia: Resolved.    Vague chest/abdomen pain:overnight EKG nonischemic changes, repeat troponin downtrending. Elevated troponin:Trop:96>101, ?demand ischemia due to tachycardia hypotension renal failure on presentation. TTE-EF 45-50%, no R WMA, G1 DD, normal RV systolic function size and normal pulmonary artery systolic pressure.  COVID-19 infection, high risk of decompensation, unvaccinated, continue remdesivir x3 days currently not hypoxic no respiratory symptoms.  CRP stable D-dimer elevated and downtrending.  On  Lovenox for DVT.   Swelling of left upper arm no DVT on duplex.keep it elevated.  3.6 X3.1X 20 cm solid-appearing mass left lateral margin of the uterus versus pedunculated fibroid or solid lesion in the left adnexa :back in April 11, 2021 ,  obtain CT as family desired work up.  Getting CT hopefully she allows.  GOC: DNR.  Palliative care following.  Patient is refusing to eat and refusing to participate in the care difficult situation moving forward unclear if she will allow for NG tube.  Psychiatry following awaiting input today.  DVT prophylaxis: lovenox therapeutic dose. Code Status:   Code Status: DNR Family Communication: plan of care discussed with patient and daughter at bedside previously, no family at bedside today. Status is: Inpatient Remains inpatient appropriate because: For ongoing management of failure to thrive Disposition: Currently not medically stable for discharge. Anticipated Disposition: For now patient will need geriatric psychiatric unit.  Total time spent in the care of this patient  35  MINUTES Objective: Vitals last 24 hrs: Vitals:   06/12/21 0805 06/12/21 1741 06/12/21 2131 06/13/21 0447  BP: 124/81 119/88 (!) 134/95 132/74  Pulse: 88 88 98 (!) 103  Resp: 18 18 18 18   Temp: 98 F (36.7 C) 98 F (36.7 C) 97.7 F (36.5 C) 98 F (36.7 C)  TempSrc: Oral Oral Oral   SpO2: 99% 98% 98% 98%  Weight:      Height:       Weight change:   Intake/Output Summary (Last 24 hours) at 06/13/2021 0841 Last data filed at 06/13/2021 0600 Gross per 24 hour  Intake 470 ml  Output 1150 ml  Net -680 ml    Net IO Since Admission: 1,580.18 mL [06/13/21 0841]   Physical Examination: General exam: AAO to place, people, date, older than stated age, weak  appearing. HEENT:Oral mucosa moist, Ear/Nose WNL grossly, dentition normal. Respiratory system: bilaterally clear, no use of accessory muscle Cardiovascular system: S1 & S2 +, No JVD,. Gastrointestinal system:  Abdomen soft,NT,ND, BS+ Nervous System:Alert, awake, moving extremities and grossly nonfocal Extremities: No edema, distal peripheral pulses palpable.  Skin: No rashes,no icterus. MSK: Normal muscle bulk,tone, power   Medications reviewed:  Scheduled Meds:  enoxaparin (LOVENOX) injection  70 mg Subcutaneous Q12H   mirtazapine  15 mg Oral QHS   sevelamer carbonate  800 mg Oral TID WC   sodium bicarbonate  650 mg Oral BID   thiamine  100 mg Oral TID   Vitamin D (Ergocalciferol)  50,000 Units Oral Q7 days   Continuous Infusions:  dextrose 5 % and 0.45 % NaCl with KCl 20 mEq/L 30 mL/hr at 06/11/21 1624   Diet Order             Diet full liquid Room service appropriate? No; Fluid consistency: Thin  Diet effective now                 Weight change:   Wt Readings from Last 3 Encounters:  06/09/21 71.4 kg  04/25/21 71.2 kg  04/24/21 70.8 kg     Consultants:see note  Procedures:see note Antimicrobials: Anti-infectives (From admission, onward)    Start     Dose/Rate Route Frequency Ordered Stop   06/10/21 2000  vancomycin (VANCOCIN) IVPB 1000 mg/200 mL premix  Status:  Discontinued        1,000 mg 200 mL/hr over 60 Minutes Intravenous Every 36 hours 06/08/21 1834 06/08/21 1843   06/10/21 1000  remdesivir 100 mg in sodium chloride 0.9 % 100 mL IVPB       See Hyperspace for full Linked Orders Report.   100 mg 200 mL/hr over 30 Minutes Intravenous Daily 06/09/21 1010 06/11/21 1124   06/09/21 1130  remdesivir 200 mg in sodium chloride 0.9% 250 mL IVPB       See Hyperspace for full Linked Orders Report.   200 mg 580 mL/hr over 30 Minutes Intravenous Once 06/09/21 1010 06/09/21 1224   06/09/21 0600  ceFEPIme (MAXIPIME) 2 g in sodium chloride 0.9 % 100 mL IVPB  Status:  Discontinued        2 g 200 mL/hr over 30 Minutes Intravenous Every 12 hours 06/08/21 1834 06/08/21 1843   06/08/21 1630  vancomycin (VANCOREADY) IVPB 1500 mg/300 mL  Status:  Discontinued        1,500 mg 150  mL/hr over 120 Minutes Intravenous  Once 06/08/21 1624 06/08/21 1843   06/08/21 1615  ceFEPIme (MAXIPIME) 2 g in sodium chloride 0.9 % 100 mL IVPB        2 g 200 mL/hr over 30 Minutes Intravenous  Once 06/08/21 1609 06/08/21 1819   06/08/21 1615  metroNIDAZOLE (FLAGYL) IVPB 500 mg        500 mg 100 mL/hr over 60 Minutes Intravenous  Once 06/08/21 1609 06/08/21 1841   06/08/21 1615  vancomycin (VANCOCIN) IVPB 1000 mg/200 mL premix  Status:  Discontinued        1,000 mg 200 mL/hr over 60 Minutes Intravenous  Once 06/08/21 1609 06/08/21 1624      Culture/Microbiology    Component Value Date/Time   SDES IN/OUT CATH URINE 06/09/2021 2318   SPECREQUEST NONE 06/09/2021 2318   CULT (A) 06/09/2021 2318    1,000 COLONIES/mL GRAM POSITIVE COCCOBACILLUS Standardized susceptibility testing for this organism is not available. Performed at [facility]  Lab, [address], [place], [place] [address]    REPTSTATUS 06/12/2021 FINAL 06/09/2021 2318    Other culture-see note  Unresulted Labs (From admission, onward)    None     Data Reviewed: I have personally reviewed following labs and imaging studies CBC: Recent Labs  Lab 06/08/21 1548 06/09/21 0301 06/10/21 0800 06/11/21 0606 06/12/21 0324  WBC 9.7 8.6 5.3 4.4 4.9  NEUTROABS 6.3  --   --   --   --   HGB 12.7 9.9* 10.9* 9.7* 10.3*  HCT 40.5 29.7* 33.5* 29.8* 31.5*  MCV 94.2 90.8 89.8 92.8 92.4  PLT 206 120* 111* PLATELET CLUMPS NOTED ON SMEAR, UNABLE TO ESTIMATE 93*    Basic Metabolic Panel: Recent Labs  Lab 06/08/21 1548 06/08/21 2243 06/09/21 0301 06/10/21 0800 06/10/21 1520 06/11/21 0606 06/12/21 0324  NA 149*  --  143 147*  --  139 143  K 3.6  --  3.0* 2.5* 3.4* 4.2 4.1  CL 110  --  112* 112*  --  117* 114*  CO2 15*  --  25 24  --  16* 19*  GLUCOSE 165*  --  126* 67*  --  81 81  BUN 43*  --  39* 27*  --  20 16  CREATININE 1.82*  --  1.43* 1.24*  --  1.12* 1.13*  CALCIUM 11.6*  --  10.0 10.0  --  8.4* 9.4  MG   --   --   --  1.4*  --  2.0  --   PHOS  --  1.6*  --  1.4*  --  2.6  --     GFR: Estimated Creatinine Clearance: 44.9 mL/min (A) (by C-G formula based on SCr of 1.13 mg/dL (H)). Liver Function Tests: Recent Labs  Lab 06/08/21 1548 06/10/21 0800 06/11/21 0606 06/12/21 0324  AST 22 29 16 18   ALT 14 26 19 17   ALKPHOS 40 42 33* 37*  BILITOT 2.0* 1.4* 1.0 0.7  PROT 6.4* 5.5* 4.2* 4.2*  ALBUMIN 3.6 2.9* 2.2* 2.2*    No results for input(s): LIPASE, AMYLASE in the last 168 hours. No results for input(s): AMMONIA in the last 168 hours. Coagulation Profile: Recent Labs  Lab 06/08/21 1529  INR 1.1    Cardiac Enzymes: Recent Labs  Lab 06/08/21 2243  CKTOTAL 13*    BNP (last 3 results) No results for input(s): PROBNP in the last 8760 hours. HbA1C: No results for input(s): HGBA1C in the last 72 hours. CBG: Recent Labs  Lab 06/08/21 1545  GLUCAP 156*    Lipid Profile: No results for input(s): CHOL, HDL, LDLCALC, TRIG, CHOLHDL, LDLDIRECT in the last 72 hours. Thyroid Function Tests: No results for input(s): TSH, T4TOTAL, FREET4, T3FREE, THYROIDAB in the last 72 hours.  Anemia Panel: No results for input(s): VITAMINB12, FOLATE, FERRITIN, TIBC, IRON, RETICCTPCT in the last 72 hours. Sepsis Labs: Recent Labs  Lab 06/08/21 1549 06/08/21 2049 06/09/21 1028  PROCALCITON  --   --  0.16  LATICACIDVEN 6.4* 3.7*  --      Recent Results (from the past 240 hour(s))  Resp Panel by RT-PCR (Flu A&B, Covid) Nasopharyngeal Swab     Status: Abnormal   Collection Time: 06/08/21  4:08 PM   Specimen: Nasopharyngeal Swab; Nasopharyngeal(NP) swabs in vial transport medium  Result Value Ref Range Status   SARS Coronavirus 2 by RT PCR POSITIVE (A) NEGATIVE Final    Comment: (NOTE) SARS-CoV-2 target nucleic acids are DETECTED.  The  SARS-CoV-2 RNA is generally detectable in upper respiratory specimens during the acute phase of infection. Positive results are indicative of the  presence of the identified virus, but do not rule out bacterial infection or co-infection with other pathogens not detected by the test. Clinical correlation with patient history and other diagnostic information is necessary to determine patient infection status. The expected result is Negative.  Fact Sheet for Patients: EntrepreneurPulse.com.au  Fact Sheet for Healthcare Providers: IncredibleEmployment.be  This test is not yet approved or cleared by the [place] FDA and  Amber been authorized for detection and/or diagnosis of SARS-CoV-2 by FDA under an Emergency Use Authorization (EUA).  This EUA will remain in effect (meaning this test can be used) for the duration of  the COVID-19 declaration under Section 564(b)(1) of the A ct, 21 U.S.C. section 360bbb-3(b)(1), unless the authorization is terminated or revoked sooner.     Influenza A by PCR NEGATIVE NEGATIVE Final   Influenza B by PCR NEGATIVE NEGATIVE Final    Comment: (NOTE) The Xpert Xpress SARS-CoV-2/FLU/RSV plus assay is intended as an aid in the diagnosis of influenza from Nasopharyngeal swab specimens and should not be used as a sole basis for treatment. Nasal washings and aspirates are unacceptable for Xpert Xpress SARS-CoV-2/FLU/RSV testing.  Fact Sheet for Patients: EntrepreneurPulse.com.au  Fact Sheet for Healthcare Providers: IncredibleEmployment.be  This test is not yet approved or cleared by the [place] FDA and Amber been authorized for detection and/or diagnosis of SARS-CoV-2 by FDA under an Emergency Use Authorization (EUA). This EUA will remain in effect (meaning this test can be used) for the duration of the COVID-19 declaration under Section 564(b)(1) of the Act, 21 U.S.C. section 360bbb-3(b)(1), unless the authorization is terminated or revoked.  Performed at [facility], [place] [address] [place], [place] [address]    Blood culture (routine x 2)     Status: None (Preliminary result)   Collection Time: 06/08/21  4:45 PM   Specimen: BLOOD RIGHT HAND  Result Value Ref Range Status   Specimen Description BLOOD RIGHT HAND  Final   Special Requests   Final    BOTTLES DRAWN AEROBIC AND ANAEROBIC Blood Culture results may not be optimal due to an inadequate volume of blood received in culture bottles   Culture   Final    NO GROWTH 4 DAYS Performed at [facility], [place] [address], [place], [place] [address]    Report Status PENDING  Incomplete  Blood culture (routine x 2)     Status: None (Preliminary result)   Collection Time: 06/08/21  8:37 PM   Specimen: BLOOD RIGHT HAND  Result Value Ref Range Status   Specimen Description BLOOD RIGHT HAND  Final   Special Requests   Final    BOTTLES DRAWN AEROBIC AND ANAEROBIC Blood Culture results may not be optimal due to an inadequate volume of blood received in culture bottles   Culture   Final    NO GROWTH 4 DAYS Performed at [facility], [place] [address] [place], [place] [address]    Report Status PENDING  Incomplete  Urine Culture     Status: Abnormal   Collection Time: 06/09/21 11:18 PM   Specimen: In/Out Cath Urine  Result Value Ref Range Status   Specimen Description IN/OUT CATH URINE  Final   Special Requests NONE  Final   Culture (A)  Final    1,000 COLONIES/mL GRAM POSITIVE COCCOBACILLUS Standardized susceptibility testing for this organism is  not available. Performed at [facility], [place] [address], [place], [place] [address]    Report Status 06/12/2021 FINAL  Final      Radiology Studies: No results found.   LOS: 5 days   Antonieta Pert, MD [facility]  06/13/2021, 8:41 AM

## 2021-06-13 NOTE — Progress Notes (Signed)
Nutrition Brief Note  RD consulted to initiate/manage tube feeding. Pt was to have Cortrak placed. Unfortunately, pt refused Cortrak placement today stating that she wanted to discuss it with her daughter and that she would "reconsider tomorrow." RD to follow-up tomorrow, 1/19.   Admitting Dx: Hypernatremia [E87.0] Failure to thrive in adult [R62.7] AKI (acute kidney injury) ([place]) [N17.9] Sepsis, due to unspecified organism, unspecified whether acute organ dysfunction present ([place]) [A41.9] PMH:  Past Medical History:  Diagnosis Date   Anxiety    Hypertension    Medications:  Scheduled Meds:  enoxaparin (LOVENOX) injection  70 mg Subcutaneous Q12H   haloperidol lactate  0.5 mg Intravenous QHS   mirtazapine  15 mg Oral QHS   sevelamer carbonate  800 mg Oral TID WC   sodium bicarbonate  650 mg Oral BID   thiamine  100 mg Oral TID   Vitamin D (Ergocalciferol)  50,000 Units Oral Q7 days  Continuous Infusions:  dextrose 5 % and 0.45 % NaCl with KCl 20 mEq/L 30 mL/hr at 06/11/21 1624   feeding supplement (OSMOLITE 1.2 CAL)     Labs: Recent Labs  Lab 06/08/21 2243 06/09/21 0301 06/10/21 0800 06/10/21 1520 06/11/21 0606 06/12/21 0324  NA  --    < > 147*  --  139 143  K  --    < > 2.5* 3.4* 4.2 4.1  CL  --    < > 112*  --  117* 114*  CO2  --    < > 24  --  16* 19*  BUN  --    < > 27*  --  20 16  CREATININE  --    < > 1.24*  --  1.12* 1.13*  CALCIUM  --    < > 10.0  --  8.4* 9.4  MG  --   --  1.4*  --  2.0  --   PHOS 1.6*  --  1.4*  --  2.6  --   GLUCOSE  --    < > 67*  --  81 81   < > = values in this interval not displayed.   Wt Readings from Last 15 Encounters:  06/09/21 71.4 kg  04/25/21 71.2 kg  04/24/21 70.8 kg  04/16/21 70.9 kg  03/29/21 72.6 kg  03/05/21 70.3 kg  Body mass index is 25.41 kg/m.   Current diet order is full liquid, patient is consuming approximately 0-20% of meals at this time. Noted refusing all POs as of the last couple days per RN and per  sitter.   RD discussed situation with Cortrak RD, RN, attending physician, and PMT. Plan to follow-up tomorrow to discuss with pt again as requested.   Theone [place]., MS, RD, LDN (she/her/hers) RD pager number and weekend/on-call pager number located in [place].

## 2021-06-13 NOTE — Progress Notes (Signed)
Palliative:  HPI: 76 y.o. female  with past medical history of hypertension, CKD stage 3b, vitamin D deficiency, secondary hyperparathyroidism, urinary incontinence, left sided adnexal mass (referred to OB/GYN), recent failure to thrive admitted on 06/08/2021 with failure to thrive with signs of acute depression/anxiety leading to physical decline with symptoms reported of weight loss, swallowing difficulty, hallucinations, paranoia, confusion, and family report she has expressed wanting to end her life.      Amber [name] is speaking with psychiatry team at time of my visit. Her condition remains unchanged. I did not speak with Amber [name] today but she has not been able to express understanding and rationale to her decisions to have capacity to make decisions for herself. Discussed with Dr. Lovette Cliche and Dr. Rosita Kea. I have had extensive conversation with family and they have elected DNR status but want trial of aggressive care and interventions including short term artificial feeding. They would not want long term artificial feeding with PEG based on review of her Living Will. They are open to consideration of all recommendations from psychiatry at this time. I did speak with daughter, Amber [name], and they will try and be present tomorrow to speak with their mother more about temporary NGT placement for feeding and medication administration. I did explain to [facility] that palliative care will be available as needed but will defer to psychiatry and medical team at this time as goals of care are clear. [name] expresses understanding.   All questions/concerns addressed. Emotional support provided.   Plan: - DNR - Trial of aggressive treatment and interventions.  - NGT for temporary nutrition and medication support.  - Copy of HCPOA and Living Will reviewed and placed on shadow chart.  - Awaiting inpatient psychiatric placement (delayed due to COVID precautions currently).  - Palliative care will not actively  follow unless called by medical team or family for follow up.   25 min  Vinie Sill, NP Palliative Medicine Team Pager [phone] (Please see [email or web address] for schedule) Team Phone [phone]    Greater than 50%  of this time was spent counseling and coordinating care related to the above assessment and plan

## 2021-06-13 NOTE — Progress Notes (Signed)
SLP Cancellation Note  Patient Details Name: Amber [name] MRN: [id number] DOB: [birth year]   Cancelled treatment:       Reason Eval/Treat Not Completed: Patient declined, no reason specified;Other (comment) (Patient refusing, telling SLP that she feels dizzy and has been nauseated and unable to eat or drink anything.)   Sonia Baller, MA, CCC-SLP Speech Therapy

## 2021-06-13 NOTE — Consult Note (Signed)
[facility] Face-to-Face Psychiatry Consult   Reason for Consult:  Depression Referring Physician:  Wynetta Fines, MD Patient Identification: Amber [name] MRN:  [id number] Principal Diagnosis: Major depressive disorder, recurrent episode, severe ([place]) Diagnosis:  Principal Problem:   Major depressive disorder, recurrent episode, severe ([place]) Active Problems:   Failure to thrive in adult   Hypernatremia   Dysphagia   COVID   Total Time spent with patient: 20 minutes  Subjective:   Amber [name] is a 76 y.o. female patient admitted with failure to thrive.  HPI:  76 y.o. 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  Also found to be COVID positive.  Psych consult placed due to depression, SI.  Assessment 76 y.o. 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 She has been refusing medications, food and losing weight.  Does not understand the implications of refusing food and medications.  She is paranoid and doesn't trust people.  Patient refused barium swallow and VQ scan and now needs CT pelvis to rule out adnexal mass. Per NP's note daughter reported that patient had been having some psychotic symptoms, such as hallucinations, paranoia, answering yes and no by counting words in questions.  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  We will start low-dose antipsychotic from 1/18.  Recommend inpatient geriatric psychiatric admission after she is medically stabilized.   Recommendations: -Consider 1:1 sitter for safety. -Continue Mirtazapine 15 mg SOL-TAB at bedtime for depression, sleep and appetite stimulation -Start 0.5 mg IV haldol QHS.  - Optimized treatment for underline medical issues, rehydration and feeding(FTT) -  Recommend Geriatric inpatient psychiatric admission after patient is medically cleared.  Thank you for this consult. Psychiatry service will continue to follow.    Subjective: Patient is seen and examined today.  Patient lying on her bed.  She reports some depression.  She states that she refused to eat as she has poor appetite and she is unable to swallow. She states Remeron did not help much to improve her appetite.  When asked why she is refusing the barium study.  She states" I know it will not help".  When asked why she is refusing medications.  She states she does not trust people and does not know what people are giving her.  She denies active or passive suicidal ideation, homicidal ideation, auditory and visual hallucinations.  She states she had heard voices and was seeing things which were not there in the past but not anymore.  Patient is oriented x3.  When asked why she is here, she states she was brought by her family as they were worried about her.  Talked to daughter Amber [name] @[phone] with her about collateral information given to nurse.  Discussed risks and benefits of starting low-dose IV Haldol nightly.  Daughter agrees with the plan and gives consent to start Haldol.  She agrees with [name] psych admission after medical stabilization.  Past Psychiatric History: as above  Risk to Self:  yes, by refusing food, meds Risk to Others: No Prior Inpatient Therapy:  None in the past Prior Outpatient Therapy:  PCP  Past Medical History:  Past Medical History:  Diagnosis Date   Anxiety    Hypertension    No past surgical history on file. Family History:  Family History  Problem Relation Age of Onset   High blood pressure Maternal Grandmother    Family Psychiatric  History:  Social History:  Social History   Substance and Sexual Activity  Alcohol Use Not Currently     Social History   Substance and Sexual Activity  Drug Use Not Currently    Social  History   Socioeconomic History   Marital status: Married    Spouse name: Not on file   Number of children: Not on file   Years of education: Not on file   Highest education level: Not on file  Occupational History   Not on file  Tobacco Use   Smoking status: Former    Types: Cigarettes   Smokeless tobacco: Never  Substance and Sexual Activity   Alcohol use: Not Currently   Drug use: Not Currently   Sexual activity: Not Currently  Other Topics Concern   Not on file  Social History Narrative   Right handed    one story home    lives with family    Doesn't work currently   Social Determinants of Radio broadcast assistant Strain: Not on file  Food Insecurity: Not on file  Transportation Needs: Not on file  Physical Activity: Not on file  Stress: Not on file  Social Connections: Not on file   Additional Social History:    Allergies:   Allergies  Allergen Reactions   Shellfish Allergy     "I pass out"    Labs:  Results for orders placed or performed during the hospital encounter of 06/08/21 (from the past 48 hour(s))  Comprehensive metabolic panel     Status: Abnormal   Collection Time: 06/12/21  3:24 AM  Result Value Ref Range   Sodium 143 135 - 145 mmol/L   Potassium 4.1 3.5 - 5.1 mmol/L   Chloride 114 (H) 98 - 111 mmol/L   CO2 19 (L) 22 - 32 mmol/L   Glucose, Bld 81 70 - 99 mg/dL    Comment: Glucose reference range applies only to samples taken after fasting for at least 8 hours.   BUN 16 8 - 23 mg/dL   Creatinine, Ser 1.13 (H) 0.44 - 1.00 mg/dL   Calcium 9.4 8.9 - 10.3 mg/dL   Total Protein 4.2 (L) 6.5 - 8.1 g/dL   Albumin 2.2 (L) 3.5 - 5.0 g/dL   AST 18 15 - 41 U/L   ALT 17 0 - 44 U/L   Alkaline Phosphatase 37 (L) 38 - 126 U/L   Total Bilirubin 0.7 0.3 - 1.2 mg/dL   GFR, Estimated 51 (L) >60 mL/min    Comment: (NOTE) Calculated using the CKD-EPI Creatinine Equation (2021)    Anion gap 10 5 - 15    Comment: Performed at [facility],  [place] [address], [place], [place] [address]  CBC     Status: Abnormal   Collection Time: 06/12/21  3:24 AM  Result Value Ref Range   WBC 4.9 4.0 - 10.5 K/uL   RBC 3.41 (L) 3.87 - 5.11 MIL/uL   Hemoglobin 10.3 (L) 12.0 - 15.0 g/dL   HCT 31.5 (L) 36.0 - 46.0 %   MCV 92.4 80.0 - 100.0 fL   MCH 30.2 26.0 - 34.0 pg   MCHC 32.7 30.0 - 36.0 g/dL   RDW 15.0 11.5 - 15.5 %   Platelets 93 (L) 150 - 400 K/uL    Comment: Immature Platelet Fraction may be clinically indicated, consider ordering  this additional test [id number] REPEATED TO VERIFY PLATELET COUNT CONFIRMED BY SMEAR    nRBC 0.0 0.0 - 0.2 %    Comment: Performed at [facility], [place] [address], [place], [place] [address]    Current Facility-Administered Medications  Medication Dose Route Frequency Provider Last Rate Last Admin   acetaminophen (TYLENOL) 160 MG/5ML solution 500 mg  500 mg Oral Q4H PRN Vernelle Emerald, MD   500 mg at 06/09/21 1204   Or   acetaminophen (TYLENOL) suppository 650 mg  650 mg Rectal Q4H PRN Vernelle Emerald, MD   650 mg at 06/09/21 0021   dextrose 5 % and 0.45 % NaCl with KCl 20 mEq/L infusion   Intravenous Continuous Kc, Maren Beach, MD 30 mL/hr at 06/11/21 1624 New Bag at 06/11/21 1624   enoxaparin (LOVENOX) injection 70 mg  70 mg Subcutaneous Q12H Einar Grad, RPH   70 mg at 06/13/21 0443   feeding supplement (OSMOLITE 1.2 CAL) liquid 1,000 mL  1,000 mL Per Tube Continuous Pershing Proud, NP       haloperidol lactate (HALDOL) injection 0.5 mg  0.5 mg Intravenous QHS Armando Reichert, MD       mirtazapine (REMERON SOL-TAB) disintegrating tablet 15 mg  15 mg Oral QHS Akintayo, Mojeed, MD   15 mg at 06/12/21 2216   ondansetron (ZOFRAN) tablet 4 mg  4 mg Oral Q6H PRN Wynetta Fines T, MD       Or   ondansetron [facility]) injection 4 mg  4 mg Intravenous Q6H PRN Wynetta Fines T, MD   4 mg at 06/08/21 2230   sevelamer carbonate (RENVELA) tablet 800 mg  800 mg Oral TID WC Wynetta Fines T, MD       sodium  bicarbonate tablet 650 mg  650 mg Oral BID Wynetta Fines T, MD   650 mg at 06/11/21 2247   thiamine tablet 100 mg  100 mg Oral TID Onnie Boer Q, RPH-CPP       Vitamin D (Ergocalciferol) (DRISDOL) capsule 50,000 Units  50,000 Units Oral Q7 days Lequita Halt, MD        Musculoskeletal: Strength & Muscle Tone:  unable to assess, patient uncooperative Gait & Station: unsteady Patient leans: N/A     Psychiatric Specialty Exam:  Presentation  General Appearance: Appropriate for Environment  Eye Contact:Minimal  Speech:Clear and Coherent  Speech Volume:Decreased  Handedness:Right   Mood and Affect  Mood:Depressed  Affect:Constricted   Thought Process  Thought Processes:Linear  Descriptions of Associations:Intact  Orientation:Full (Time, Place and Person)  Thought Content:Paranoid Ideation  History of Schizophrenia/Schizoaffective disorder:No  Duration of Psychotic Symptoms:Less than six months  Hallucinations:Hallucinations: None   Ideas of Reference:Paranoia  Suicidal Thoughts:Suicidal Thoughts: No   Homicidal Thoughts:Homicidal Thoughts: No    Sensorium  Memory:Immediate Fair; Recent Poor; Remote Poor  Judgment:Poor  Insight:Poor   Executive Functions  Concentration:Fair  Attention Span:Fair  Recall:-- (Unable to assess)  Fund of Knowledge:Poor  Language:Fair   Psychomotor Activity  Psychomotor Activity:Psychomotor Activity: Decreased; Psychomotor Retardation    Assets  Assets:Social Support   Sleep  Sleep:Sleep: Good    Physical Exam: Physical Exam Vitals reviewed.  Constitutional:      Appearance: She is not toxic-appearing.  HENT:     Head: Normocephalic.  Pulmonary:     Effort: Pulmonary effort is normal.  Neurological:     Mental Status: She is alert and oriented to person, place, and time.   Review of Systems  Psychiatric/Behavioral:  Positive for depression and hallucinations. Negative for suicidal ideas. The  patient is not nervous/anxious.   Blood pressure 126/78, pulse 99, temperature 97.8 F (36.6 C), temperature source Oral, resp. rate 18, height 5\' 6"  (1.676 m), weight 71.4 kg, SpO2 97 %. Body mass index is 25.41 kg/m.  Recommendations: -Consider 1:1 sitter for safety -Continue Mirtazapine 15 mg SOL-TAB at bedtime for depression, sleep and appetite stimulation -Start 0.5 mg IV haldol QHS.  - Optimized treatment for underline medical issues, rehydration and feeding(FTT) - Recommend Geriatric inpatient psychiatric admission after patient is medically cleared  Disposition: Recommend psychiatric Inpatient admission when medically cleared. Supportive therapy provided about ongoing stressors. Psychiatric service will follow  Armando Reichert, MD PGy2 06/13/2021 2:00 PM

## 2021-06-13 NOTE — Progress Notes (Signed)
[facility] for enoxaparin Indication: DVT  Allergies  Allergen Reactions   Shellfish Allergy     "I pass out"    Patient Measurements: Height: 5\' 6"  (167.6 cm) Weight: 71.4 kg (157 lb 6.5 oz) IBW/kg (Calculated) : 59.3  Vital Signs: Temp: 97.8 F (36.6 C) (01/18 0849) Temp Source: Oral (01/18 0849) BP: 126/78 (01/18 0849) Pulse Rate: 99 (01/18 0849)  Labs: Recent Labs    06/11/21 0606 06/12/21 0324  HGB 9.7* 10.3*  HCT 29.8* 31.5*  PLT PLATELET CLUMPS NOTED ON SMEAR, UNABLE TO ESTIMATE 93*  CREATININE 1.12* 1.13*     Estimated Creatinine Clearance: 44.9 mL/min (A) (by C-G formula based on SCr of 1.13 mg/dL (H)).   Medical History: Past Medical History:  Diagnosis Date   Anxiety    Hypertension       Assessment: 16 yoF admitted encephalopathic with COVID. 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 Cr stable, H/H ok.  Plan is to transition to apixaban at discharge. We are doing a copay check today. We will watch closely for bleeding since plt is low.   Hgb 10.3, plt 93 Goal of Therapy:  Anti-Xa level 0.6-1 units/ml 4hrs after LMWH dose given Monitor platelets by anticoagulation protocol: Yes   Plan:  Enoxaparin 70mg  BID Watch Cr and pltc closely  Onnie Boer, PharmD, BCIDP, AAHIVP, CPP Infectious Disease Pharmacist 06/13/2021 9:55 AM

## 2021-06-14 ENCOUNTER — Inpatient Hospital Stay (HOSPITAL_COMMUNITY): Payer: Medicare Other

## 2021-06-14 DIAGNOSIS — F333 Major depressive disorder, recurrent, severe with psychotic symptoms: Secondary | ICD-10-CM

## 2021-06-14 MED ORDER — ADULT MULTIVITAMIN W/MINERALS CH: 1.0000 | ORAL_TABLET | Freq: Every day | ORAL | Status: DC

## 2021-06-14 MED ORDER — BISACODYL 10 MG RE SUPP: 10.0000 mg | Freq: Every day | RECTAL | Status: DC | PRN

## 2021-06-14 MED ORDER — ENSURE ENLIVE PO LIQD
237.0000 mL | Freq: Three times a day (TID) | ORAL | Status: DC
  Administered 2021-06-17: 237 mL via ORAL

## 2021-06-14 MED ORDER — OSMOLITE 1.2 CAL PO LIQD
1000.0000 mL | ORAL | Status: DC
  Administered 2021-06-15 – 2021-06-18 (×4): 1000 mL
  Filled 2021-06-14 (×13): qty 1000

## 2021-06-14 MED ORDER — TAMSULOSIN HCL 0.4 MG PO CAPS
0.4000 mg | ORAL_CAPSULE | Freq: Every day | ORAL | Status: DC
  Administered 2021-06-16 – 2021-06-19 (×4): 0.4 mg via ORAL
  Filled 2021-06-14 (×4): qty 1

## 2021-06-14 MED ORDER — POLYETHYLENE GLYCOL 3350 17 G PO PACK
17.0000 g | PACK | Freq: Every day | ORAL | Status: DC
  Filled 2021-06-14: qty 1

## 2021-06-14 MED ORDER — IOHEXOL 300 MG/ML  SOLN
100.0000 mL | Freq: Once | INTRAMUSCULAR | Status: AC | PRN
  Administered 2021-06-14: 100 mL via INTRAVENOUS

## 2021-06-14 NOTE — Progress Notes (Signed)
Initial Nutrition Assessment  DOCUMENTATION CODES:   Not applicable  INTERVENTION:  -Ensure Enlive po TID, each supplement provides 350 kcal and 20 grams of protein -MVI with minerals daily  Once small bore NGT/Cortrak is placed and ready for use: -Initiate Osmolite 1.2 @ 22ml/hr, advance 48ml/hr Q4H until goal rate of 38ml/hr is reached (1562ml/d)  At goal, TF would provide 1872 kcals, 86 grams protein, 1220ml free water  Once feeding is initiated, monitor magnesium, potassium, and phosphorus BID for at least 3 days, MD to replete as needed, as pt is at risk for refeeding syndrome given minimal PO for >1 week.   NUTRITION DIAGNOSIS:   Inadequate oral intake related to chronic illness (major depressive disorder) as evidenced by per patient/family report, meal completion < 25%.  GOAL:   Patient will meet greater than or equal to 90% of their needs  MONITOR:   PO intake, Supplement acceptance, Diet advancement, Labs, Weight trends, I & O's  REASON FOR ASSESSMENT:   Consult Enteral/tube feeding initiation and management  ASSESSMENT:   Pt with PMH significant for HTN, CKD stg IIIb, vitamin D deficiency 2/2 CKD, secondary hyperparathyroidism admitted with hypernatremia 2/2 FTT in the setting of depression/MDD.  RD consulted yesterday to initiate/manage enteral nutrition given pt has had minimal PO since PTA 2/2 MDD/SI. Discussed pt with MD, RN, Cortrak RD, and PMT in detail. Yesterday, pt refused Cortrak placement and given pt has not been officially deemed incompetent, decision was made to hold off on Cortrak placement. Per MD, pt now agreeable to bedside small bore NGT placement. Discussed options for having one placed today and also explained Cortrak service will be available tomorrow. Still awaiting response from MD regarding plan as no NGT has been placed as of the time of this note. Note pt will be at risk for refeeding syndrome once feeding is initiated. Pt's safety sitter  today describes multiple attempts at feeding pt w/ max cues/encouragement and pt still only consuming <10%. Note pt/family report h/o swallowing problems. Speech evaluation is pending. Pt on full liquids for now. Will order ONS and provide TF recommendations for if/once NGT is placed.   Weight history reviewed. Weight appears stable based on available weight readings.   PO intake: 0-20% x 8 recorded meals (~4% avg meal intake)  Medications: remeron, miralax, renvela, sodium bicarbonate, thiamine, vitamin D Labs reviewed.  UOP: 549ml x24 hours I/O: +1155ml since admit  NUTRITION - FOCUSED PHYSICAL EXAM: Unable to perform at this time. Will attempt at follow-up.   Diet Order:   Diet Order             Diet full liquid Room service appropriate? No; Fluid consistency: Thin  Diet effective now                   EDUCATION NEEDS:   Not appropriate for education at this time  Skin:  Skin Assessment: Reviewed RN Assessment  Last BM:  1/19  Height:   Ht Readings from Last 1 Encounters:  06/09/21 5\' 6"  (1.676 m)    Weight:   Wt Readings from Last 1 Encounters:  06/09/21 71.4 kg    BMI:  Body mass index is 25.41 kg/m.  Estimated Nutritional Needs:   Kcal:  4854-6270  Protein:  85-100 grams  Fluid:  >1.75L     Theone Stanley., MS, RD, LDN (she/her/hers) RD pager number and weekend/on-call pager number located in [place].

## 2021-06-14 NOTE — Progress Notes (Signed)
PROGRESS NOTE    Amber [name]  OVF:[id number] DOB: 1946-03-03 DOA: 06/08/2021 PCP: Lindell Spar, MD   Brief Narrative/Hospital Course: Amber [name], 75 y.o. 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  As per daughter patient started having swallowing problem for several weeks with dry heaves after eating solid food, episodes of confusion present hallucination and complaining of feeling something stuck in the throat when swallowing solid food and choked x2 and has refused to eat any solid food past week and mainly drinking water,  patient also complained "Has had enough and wanted to end her life". Husband had covid 3 wks ago and patient  had sore throat and stuffy nose that time.Her  symptoms onset from October 2022, and daughter reports that patient was seen by neurology and had work up with MRI brain, MR venogram CT venogram November 04/24/21-no acute stroke, no dural venous sinus thrombosis.  According to PCPs note, patient was worked up recently for elevated creatinine levels, renal ultrasound showed slight left-sided hydronephrosis.  Pelvis ultrasound showed mass, for which patient was referred to see outpatient OB/GYN.  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  And patient was started on vitamin D supplement however family reported patient has not been compliant with any of her pills.  PCP also tried Remeron and Ranexa, and patient has not been compliant either In the ED was hypotensive tachycardic labs with lactic acidosis 6.1, elevated creatinine 1.8 hypernatremia 149 hypercalcemia 11.6.  Was given 2 L bolus LR, blood pressure improved, also placed on broad-spectrum antibiotics.  CT head chronic atrophy with white matter microvascular disease, chest x-ray no  active disease Renal ultrasound 2.2 cm right renal calculus and right renal pain  with mild right-sided hydronephrosis.COVID-19 positive.  Patient has had episodes of depression over the years, patient started becoming paranoid at the beginning of the pandemic was very fearful, as she was a heavy smoker and this led to her being more isolated from people and even her own family.  Patient had significant decline past 2 months once he became increasingly paranoid worsening hallucinations declining oral intake becoming bedridden and decided she was unable to walk unable to swallow would not get up to go to the restroom using adult diapers.  She has a neurology evaluation on November 2022 w/ neg Mri.  Subjective: Seen this am Aalert awake and oriented More calm and cooperative today Declined NG tube yesterday Overnight no fever Underwent CT abdomen pelvis this morning. Agreeable to got feeding tube but wants to be done in room  Assessment & Plan:  Failure to thrive with poor oral intake ?Dysphagia/choking sensation: seen by speech, patient refused GI eval and DG esophagogram.  Reluctant to take p.o. refusing PO med. Cont w/ IV fluids, PPI, nutrition supplement. Agrees for NG placement for tube feeding but wants it done at bedside.  Mild confusion-mild acute metabolic encephalopathy:multifactorial in the setting of multiple medical comorbidities, dehydration, dysphagia,COVID-19 infection psych issues.  She is alert awake oriented x3 at this time. S/p high-dose thiamine.She has had extensive neuro work up including MRI brain, MR venogram CT venogram 04/24/21 as outpatient-no acute stroke, no dural venous sinus thrombosis. CT head in ED no acute finding, no obvious evidence of infection chest x-ray.UA leukocytes trace, WBC 6-10,culture reincubated.   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  Right distal femoral vein age indeterminate nonocclusive DVT Chronic nonocclusive DVT left popliteal vein Elevated D-dimer, in the setting of COVID: Continue therapeutic Lovenox, refused VQ scan.Discussed with the daughter agreed for anticoagulation and will plan for Eliquis on discharge-but she does not have insurance.  AKI on CKDIIIb Dehydration  baseline creatinine 1.5, prerenal AKI 2/2 hypotension volume depletion from poor oral intake.  ultrasound with bilateral non-obstructive renal calculus and mild right-sided hydronephrosis.  AKI improved.continue gentle IV fluids. Encourage oral intake. Recent Labs  Lab 06/08/21 1548 06/09/21 0301 06/10/21 0800 06/11/21 0606 06/12/21 0324  BUN 43* 39* 27* 20 16  CREATININE 1.82* 1.43* 1.24* 1.12* 1.13*    Lactic acidosis, metabolic acidosis:due to hypotension dehydration. Resolved Hypernatremia: resolved  Hypercalcemia Secondary hyperparathyroidism Vitamin D deficiency: She has had recent work-up, calcium improved to 10> repeat stable, likely in the setting of volume epletion,Secondary to hyper parathyroidism from outpatient work-up but also had vitamin D deficiency, continue to replete.  Hypokalemia/hypophosphatemia/hypomagnesemia: stable  Vague chest/abdomen pain:overnight EKG nonischemic changes, repeat troponin downtrending. Elevated troponin:Trop:96>101, ?demand ischemia due to tachycardia hypotension renal failure on presentation. TTE-EF 45-50%, no R WMA, G1 DD, normal RV systolic function size and normal pulmonary artery systolic pressure.  COVID-19 infection, high risk of decompensation, unvaccinated, s/p Remdesivir x3 days currently not hypoxic no respiratory symptoms.CRP stable D-dimer elevated and downtrending.  Cont Lovenox for DVT.   Swelling of left upper arm:no DVT on duplex.keep it elevated.  Left  uterine 3.9x2.9 cm fibroid:CT abd done 1/19- strongly favors fibroid although can be characterized with contrast enhanced MRI- will ask her to see PCP for that. No lymphadenoapthy. Fecal impaction-added rectal bisacodyl, miralax daily Urine retention-add flomax, in and out cath as needed and treat constpation   GOC: DNR.  Palliative care following.  Patient is refusing to eat and refusing to participate in the care difficult situation moving forward unclear if she will allow for NG tube.  Psychiatry following awaiting input today.  DVT prophylaxis: lovenox therapeutic dose. Code Status:   Code Status: DNR Family Communication: plan of care discussed with patient and daughter at bedside previously, no family at bedside today. Status is: Inpatient Remains inpatient appropriate because: For ongoing management of failure to thrive Disposition: Currently not medically stable for discharge. Anticipated Disposition: For now patient will need geriatric psychiatric unit.  Total time spent in the care of this patient  35  MINUTES Objective: Vitals last 24 hrs: Vitals:   06/13/21 1638 06/13/21 2101 06/14/21 0626 06/14/21 0730  BP: (!) 151/88 128/85 114/61 106/83  Pulse: (!) 114 100 (!) 118 (!) 122  Resp: 16 16 17 16   Temp:  98.6 F (37 C) 98 F (36.7 C) 97.9 F (36.6 C)  TempSrc: Oral  Oral Oral  SpO2: 94% 100% 100% 100%  Weight:      Height:       Weight change:   Intake/Output Summary (Last 24 hours) at 06/14/2021 0859 Last data filed at 06/14/2021 0753 Gross per 24 hour  Intake 284.5 ml  Output 751 ml  Net -466.5 ml   Net IO Since Admission: 1,113.68 mL [06/14/21 0859]   Physical Examination: General exam: AAOx 3, pleasant, older than stated age, weak appearing. HEENT:Oral mucosa moist, Ear/Nose WNL grossly, dentition normal. Respiratory system: bilaterally clear, no use  of accessory muscle Cardiovascular system: S1 & S2 +, No JVD,. Gastrointestinal system: Abdomen soft, NT,ND,  BS+ Nervous System:Alert, awake, moving extremities and grossly nonfocal Extremities: No edema, distal peripheral pulses palpable.  Skin: No rashes,no icterus. MSK: Normal muscle bulk,tone, power   Medications reviewed:  Scheduled Meds:  enoxaparin (LOVENOX) injection  70 mg Subcutaneous Q12H   haloperidol lactate  0.5 mg Intravenous QHS   mirtazapine  15 mg Oral QHS   polyethylene glycol  17 g Oral Daily   sevelamer carbonate  800 mg Oral TID WC   sodium bicarbonate  650 mg Oral BID   tamsulosin  0.4 mg Oral QPC supper   thiamine  100 mg Oral TID   Vitamin D (Ergocalciferol)  50,000 Units Oral Q7 days   Continuous Infusions:  dextrose 5 % and 0.45 % NaCl with KCl 20 mEq/L 30 mL/hr at 06/11/21 1624   feeding supplement (OSMOLITE 1.2 CAL)     Diet Order             Diet full liquid Room service appropriate? No; Fluid consistency: Thin  Diet effective now                 Weight change:   Wt Readings from Last 3 Encounters:  06/09/21 71.4 kg  04/25/21 71.2 kg  04/24/21 70.8 kg     Consultants:see note  Procedures:see note Antimicrobials: Anti-infectives (From admission, onward)    Start     Dose/Rate Route Frequency Ordered Stop   06/10/21 2000  vancomycin (VANCOCIN) IVPB 1000 mg/200 mL premix  Status:  Discontinued        1,000 mg 200 mL/hr over 60 Minutes Intravenous Every 36 hours 06/08/21 1834 06/08/21 1843   06/10/21 1000  remdesivir 100 mg in sodium chloride 0.9 % 100 mL IVPB       See Hyperspace for full Linked Orders Report.   100 mg 200 mL/hr over 30 Minutes Intravenous Daily 06/09/21 1010 06/11/21 1124   06/09/21 1130  remdesivir 200 mg in sodium chloride 0.9% 250 mL IVPB       See Hyperspace for full Linked Orders Report.   200 mg 580 mL/hr over 30 Minutes Intravenous Once 06/09/21 1010 06/09/21 1224   06/09/21 0600  ceFEPIme (MAXIPIME) 2 g in sodium chloride 0.9 % 100 mL IVPB  Status:  Discontinued        2 g 200 mL/hr over 30 Minutes Intravenous  Every 12 hours 06/08/21 1834 06/08/21 1843   06/08/21 1630  vancomycin (VANCOREADY) IVPB 1500 mg/300 mL  Status:  Discontinued        1,500 mg 150 mL/hr over 120 Minutes Intravenous  Once 06/08/21 1624 06/08/21 1843   06/08/21 1615  ceFEPIme (MAXIPIME) 2 g in sodium chloride 0.9 % 100 mL IVPB        2 g 200 mL/hr over 30 Minutes Intravenous  Once 06/08/21 1609 06/08/21 1819   06/08/21 1615  metroNIDAZOLE (FLAGYL) IVPB 500 mg        500 mg 100 mL/hr over 60 Minutes Intravenous  Once 06/08/21 1609 06/08/21 1841   06/08/21 1615  vancomycin (VANCOCIN) IVPB 1000 mg/200 mL premix  Status:  Discontinued        1,000 mg 200 mL/hr over 60 Minutes Intravenous  Once 06/08/21 1609 06/08/21 1624      Culture/Microbiology    Component Value Date/Time   SDES IN/OUT CATH URINE 06/09/2021 2318   [place] NONE 06/09/2021 2318   CULT (A) 06/09/2021 2318  1,000 COLONIES/mL GRAM POSITIVE COCCOBACILLUS Standardized susceptibility testing for this organism is not available. Performed at [facility], [place] [address] [place], [place] [address]    REPTSTATUS 06/12/2021 FINAL 06/09/2021 2318    Other culture-see note  Unresulted Labs (From admission, onward)     Start     Ordered   06/15/21 8099  Basic metabolic panel  Tomorrow morning,   R        06/14/21 0724   06/15/21 0500  CBC  Tomorrow morning,   R        06/14/21 0724          Data Reviewed: I have personally reviewed following labs and imaging studies CBC: Recent Labs  Lab 06/08/21 1548 06/09/21 0301 06/10/21 0800 06/11/21 0606 06/12/21 0324  WBC 9.7 8.6 5.3 4.4 4.9  NEUTROABS 6.3  --   --   --   --   HGB 12.7 9.9* 10.9* 9.7* 10.3*  HCT 40.5 29.7* 33.5* 29.8* 31.5*  MCV 94.2 90.8 89.8 92.8 92.4  PLT 206 120* 111* PLATELET CLUMPS NOTED ON SMEAR, UNABLE TO ESTIMATE 93*   Basic Metabolic Panel: Recent Labs  Lab 06/08/21 1548 06/08/21 2243 06/09/21 0301 06/10/21 0800 06/10/21 1520 06/11/21 0606 06/12/21 0324   NA 149*  --  143 147*  --  139 143  K 3.6  --  3.0* 2.5* 3.4* 4.2 4.1  CL 110  --  112* 112*  --  117* 114*  CO2 15*  --  25 24  --  16* 19*  GLUCOSE 165*  --  126* 67*  --  81 81  BUN 43*  --  39* 27*  --  20 16  CREATININE 1.82*  --  1.43* 1.24*  --  1.12* 1.13*  CALCIUM 11.6*  --  10.0 10.0  --  8.4* 9.4  MG  --   --   --  1.4*  --  2.0  --   PHOS  --  1.6*  --  1.4*  --  2.6  --    GFR: Estimated Creatinine Clearance: 44.9 mL/min (A) (by C-G formula based on SCr of 1.13 mg/dL (H)). Liver Function Tests: Recent Labs  Lab 06/08/21 1548 06/10/21 0800 06/11/21 0606 06/12/21 0324  AST 22 29 16 18   ALT 14 26 19 17   ALKPHOS 40 42 33* 37*  BILITOT 2.0* 1.4* 1.0 0.7  PROT 6.4* 5.5* 4.2* 4.2*  ALBUMIN 3.6 2.9* 2.2* 2.2*   No results for input(s): LIPASE, AMYLASE in the last 168 hours. No results for input(s): AMMONIA in the last 168 hours. Coagulation Profile: Recent Labs  Lab 06/08/21 1529  INR 1.1   Cardiac Enzymes: Recent Labs  Lab 06/08/21 2243  CKTOTAL 13*   BNP (last 3 results) No results for input(s): PROBNP in the last 8760 hours. HbA1C: No results for input(s): HGBA1C in the last 72 hours. CBG: Recent Labs  Lab 06/08/21 1545  GLUCAP 156*   Lipid Profile: No results for input(s): CHOL, HDL, LDLCALC, TRIG, CHOLHDL, LDLDIRECT in the last 72 hours. Thyroid Function Tests: No results for input(s): TSH, T4TOTAL, FREET4, T3FREE, THYROIDAB in the last 72 hours.  Anemia Panel: No results for input(s): VITAMINB12, FOLATE, FERRITIN, TIBC, IRON, RETICCTPCT in the last 72 hours. Sepsis Labs: Recent Labs  Lab 06/08/21 1549 06/08/21 2049 06/09/21 1028  PROCALCITON  --   --  0.16  LATICACIDVEN 6.4* 3.7*  --     Recent Results (from the past 240  hour(s))  Resp Panel by RT-PCR (Flu A&B, Covid) Nasopharyngeal Swab     Status: Abnormal   Collection Time: 06/08/21  4:08 PM   Specimen: Nasopharyngeal Swab; Nasopharyngeal(NP) swabs in vial transport medium   Result Value Ref Range Status   SARS Coronavirus 2 by RT PCR POSITIVE (A) NEGATIVE Final    Comment: (NOTE) SARS-CoV-2 target nucleic acids are DETECTED.  The SARS-CoV-2 RNA is generally detectable in upper respiratory specimens during the acute phase of infection. Positive results are indicative of the presence of the identified virus, but do not rule out bacterial infection or co-infection with other pathogens not detected by the test. Clinical correlation with patient history and other diagnostic information is necessary to determine patient infection status. The expected result is Negative.  Fact Sheet for Patients: EntrepreneurPulse.com.au  Fact Sheet for Healthcare Providers: IncredibleEmployment.be  This test is not yet approved or cleared by the [place] FDA and  has been authorized for detection and/or diagnosis of SARS-CoV-2 by FDA under an Emergency Use Authorization (EUA).  This EUA will remain in effect (meaning this test can be used) for the duration of  the COVID-19 declaration under Section 564(b)(1) of the A ct, 21 U.S.C. section 360bbb-3(b)(1), unless the authorization is terminated or revoked sooner.     Influenza A by PCR NEGATIVE NEGATIVE Final   Influenza B by PCR NEGATIVE NEGATIVE Final    Comment: (NOTE) The Xpert Xpress SARS-CoV-2/FLU/RSV plus assay is intended as an aid in the diagnosis of influenza from Nasopharyngeal swab specimens and should not be used as a sole basis for treatment. Nasal washings and aspirates are unacceptable for Xpert Xpress SARS-CoV-2/FLU/RSV testing.  Fact Sheet for Patients: EntrepreneurPulse.com.au  Fact Sheet for Healthcare Providers: IncredibleEmployment.be  This test is not yet approved or cleared by the [place] FDA and has been authorized for detection and/or diagnosis of SARS-CoV-2 by FDA under an Emergency Use Authorization (EUA).  This EUA will remain in effect (meaning this test can be used) for the duration of the COVID-19 declaration under Section 564(b)(1) of the Act, 21 U.S.C. section 360bbb-3(b)(1), unless the authorization is terminated or revoked.  Performed at [facility], [place] [address] [place], [place] [address]   Blood culture (routine x 2)     Status: None   Collection Time: 06/08/21  4:45 PM   Specimen: BLOOD RIGHT HAND  Result Value Ref Range Status   Specimen Description BLOOD RIGHT HAND  Final   Special Requests   Final    BOTTLES DRAWN AEROBIC AND ANAEROBIC Blood Culture results may not be optimal due to an inadequate volume of blood received in culture bottles   Culture   Final    NO GROWTH 5 DAYS Performed at [facility], [place] [address], [place], [place] [address]    Report Status 06/13/2021 FINAL  Final  Blood culture (routine x 2)     Status: None   Collection Time: 06/08/21  8:37 PM   Specimen: BLOOD RIGHT HAND  Result Value Ref Range Status   Specimen Description BLOOD RIGHT HAND  Final   Special Requests   Final    BOTTLES DRAWN AEROBIC AND ANAEROBIC Blood Culture results may not be optimal due to an inadequate volume of blood received in culture bottles   Culture   Final    NO GROWTH 5 DAYS Performed at [facility], [place] [address] [place],  [address]    Report Status 06/13/2021 FINAL  Final  Urine Culture     Status: Abnormal   Collection Time: 06/09/21 11:18 PM   Specimen: In/Out Cath Urine  Result Value Ref Range Status   Specimen Description IN/OUT CATH URINE  Final   Special Requests NONE  Final   Culture (A)  Final    1,000 COLONIES/mL GRAM POSITIVE COCCOBACILLUS Standardized susceptibility testing for this organism is not available. Performed at [facility], [place] [address] [place], [place] [address]    Report Status 06/12/2021 FINAL  Final      Radiology Studies: CT ABDOMEN PELVIS W CONTRAST  Result Date:  06/14/2021 CLINICAL DATA:  Left adnexal mass, dysphagia, malignancy suspected EXAM: CT ABDOMEN AND PELVIS WITH CONTRAST TECHNIQUE: Multidetector CT imaging of the abdomen and pelvis was performed using the standard protocol following bolus administration of intravenous contrast. RADIATION DOSE REDUCTION: This exam was performed according to the departmental dose-optimization program which includes automated exposure control, adjustment of the mA and/or kV according to patient size and/or use of iterative reconstruction technique. CONTRAST:  161mL OMNIPAQUE IOHEXOL 300 MG/ML  SOLN COMPARISON:  CT abdomen pelvis, 03/05/2021, pelvic ultrasound, 04/11/2021 FINDINGS: Lower chest: No acute abnormality. Hepatobiliary: No solid liver abnormality is seen. Hepatic steatosis. No gallstones, gallbladder wall thickening, or biliary dilatation. Pancreas: Unremarkable. No pancreatic ductal dilatation or surrounding inflammatory changes. Spleen: Normal in size without significant abnormality. Adrenals/Urinary Tract: Adrenal glands are unremarkable. Large calculus in the right renal pelvis measuring 1.5 cm (series 4, image 32). Additional punctuate nonobstructive calculi in the inferior pole of left kidney (series 7, image 49). No hydronephrosis. Bladder is unremarkable. Stomach/Bowel: Stomach is within normal limits. Appendix appears normal. Descending and sigmoid diverticulosis. Large stool ball in the rectum measuring at least 7.3 cm, with mild circumferential rectal wall thickening and adjacent fat stranding (series 4, image 78). Vascular/Lymphatic: Aortic atherosclerosis. No enlarged abdominal or pelvic lymph nodes. Reproductive: Partially exophytic mass of the left aspect of the uterine fundus, with homogeneous enhancement and texture to the adjacent myometrium, measuring 3.9 x 2.9 cm (series 4, image 69). This is additionally clearly distinct from the normal left ovary (series 4, image 62). Other: No abdominal wall hernia or  abnormality. No ascites. Musculoskeletal: No acute or significant osseous findings. IMPRESSION: 1. Partially exophytic mass of the left aspect of the uterine fundus, with homogeneous enhancement and texture to the adjacent myometrium, measuring 3.9 x 2.9 cm. This is additionally clearly distinct from the normal left ovary. Uterine fibroid is strongly favored, in keeping with findings of prior ultrasound, although this could be definitively characterized by contrast enhanced pelvic MRI on a nonemergent, outpatient basis if desired. 2. No evidence of lymphadenopathy or metastatic disease in the abdomen or pelvis. 3. Large stool ball in the rectum measuring at least 7.3 cm, with mild circumferential rectal wall thickening and adjacent fat stranding. Correlate for fecal impaction and stercoral colitis. 4. Nonobstructive bilateral nephrolithiasis. 5. Hepatic steatosis. 6. Descending and sigmoid diverticulosis without evidence of acute diverticulitis. Aortic Atherosclerosis (ICD10-I70.0). Electronically Signed   By: Delanna Ahmadi M.D.   On: 06/14/2021 08:18     LOS: 6 days   Antonieta Pert, MD [facility]  06/14/2021, 8:59 AM

## 2021-06-14 NOTE — Progress Notes (Signed)
PT Cancellation Note  Patient Details Name: Amber [name] MRN: [id number] DOB: [birth year]   Cancelled Treatment:    Reason Eval/Treat Not Completed: Patient declined, no reason specified. Pt reports family coming to visit very soon and declines PT session currently. PT will return tomorrow. Pt reports "that would be great".   Zenaida Niece 06/14/2021, 6:08 PM

## 2021-06-14 NOTE — Plan of Care (Signed)
  Problem: Education: Goal: Knowledge of General Education information will improve Description: Including pain rating scale, medication(s)/side effects and non-pharmacologic comfort measures Outcome: Not Progressing   Problem: Health Behavior/Discharge Planning: Goal: Ability to manage health-related needs will improve Outcome: Not Progressing   

## 2021-06-14 NOTE — Progress Notes (Signed)
°   06/14/21 0730  Assess: MEWS Score  Temp 97.9 F (36.6 C)  BP 106/83  Pulse Rate (!) 122  Resp 16  SpO2 100 %  O2 Device Room Air  Assess: MEWS Score  MEWS Temp 0  MEWS Systolic 0  MEWS Pulse 2  MEWS RR 0  MEWS LOC 0  MEWS Score 2  MEWS Score Color Yellow  Assess: if the MEWS score is Yellow or Red  Were vital signs taken at a resting state? Yes  Focused Assessment No change from prior assessment  Early Detection of Sepsis Score *See Row Information* Low  MEWS guidelines implemented *See Row Information* No, previously yellow, continue vital signs every 4 hours  Treat  Pain Scale 0-10  Pain Score 0  Escalate  MEWS: Escalate Yellow: discuss with charge nurse/RN and consider discussing with provider and RRT  Notify: Charge Nurse/RN  Name of Charge Nurse/RN Notified Nichola, RN  Date Charge Nurse/RN Notified 06/14/21    Patient continues to fire yellow mews due to tachycardia

## 2021-06-14 NOTE — Progress Notes (Signed)
Speech Language Pathology Treatment: Dysphagia  Patient Details Name: Amber [name] MRN: [id number] DOB: 04-12-1946 Today's Date: 06/14/2021 Time: 1245-1300 SLP Time Calculation (min) (ACUTE ONLY): 15 min  Assessment / Plan / Recommendation Clinical Impression  Patient seen by SLP to address dysphagia goals. When SLP entered room, patient awake and alert, refusing on food and liquids on meal tray. She reported that she already drank some liquids and ate some solids, mentioning eating a salad and some meat. (She is on full liquids diet and has not received these types of PO's). Patient in agreement to take sip of water, asking SLP "is it just water? I dont want any medicine". She took one very small straw sip of thin liquids (plain water) before handing the cup to SLP and refusing any more. SLP did encourage patient to eat and drink as currently she is only getting IV fluids. When asked, she was not able to indicate what may have caused her ceasing to consume solids which started at home. She only reported that some foods had made her gag and "I can't swallow anything". Unfortunately, patient continues to refuse PO's influding oral medications and she has reportedly refused PEG or Cortrak feeding tubes. She told SLP "Im not going back to that surgery room". Recommend SLP attempt at least one more visit next week to determine if patient would be safe to liberalize diet.     HPI HPI: Amber [name], 76 y.o. 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  As per daughter patient started having swallowing problem for several weeks with dry heaves after eating solid food, episodes of confusion with hallucination, and complaining of feeling something stuck in the throat when swallowing solid food.  Head CT and CXR were negative for acute abnormalities.      SLP Plan  Continue with current plan of care       Recommendations for follow up therapy are one component of a multi-disciplinary discharge planning process, led by the attending physician.  Recommendations may be updated based on patient status, additional functional criteria and insurance authorization.    Recommendations  Diet recommendations: Other(comment);Thin liquid (full liquids) Liquids provided via: Cup;Straw Medication Administration: Crushed with puree Supervision: Patient able to self feed;Full supervision/cueing for compensatory strategies                Oral Care Recommendations: Oral care BID Follow Up Recommendations: Follow physician's recommendations for discharge plan and follow up therapies Assistance recommended at discharge: Frequent or constant Supervision/Assistance SLP Visit Diagnosis: Dysphagia, unspecified (R13.10) Plan: Continue with current plan of care         Sonia Baller, MA, CCC-SLP Speech Therapy

## 2021-06-14 NOTE — Progress Notes (Signed)
Spoke with patient's daughter on the phone. Update provided regarding patient's resistance to placement of NG tube, daughter verbalized understanding of her mother's hesitance. Stated she was happy to be present to encourage her mom and provide moral support for the next attempt at moving forward with tube placement.

## 2021-06-15 ENCOUNTER — Inpatient Hospital Stay (HOSPITAL_COMMUNITY): Payer: Medicare Other

## 2021-06-15 LAB — GLUCOSE, CAPILLARY
Glucose-Capillary: 84 mg/dL (ref 70–99)
Glucose-Capillary: 84 mg/dL (ref 70–99)
Glucose-Capillary: 100 mg/dL — ABNORMAL HIGH (ref 70–99)
Glucose-Capillary: 80 mg/dL (ref 70–99)
Glucose-Capillary: 106 mg/dL — ABNORMAL HIGH (ref 70–99)

## 2021-06-15 LAB — BASIC METABOLIC PANEL
Anion gap: 7 (ref 5–15)
BUN: 12 mg/dL (ref 8–23)
CO2: 20 mmol/L — ABNORMAL LOW (ref 22–32)
Calcium: 9.5 mg/dL (ref 8.9–10.3)
Chloride: 116 mmol/L — ABNORMAL HIGH (ref 98–111)
Creatinine, Ser: 1.21 mg/dL — ABNORMAL HIGH (ref 0.44–1.00)
GFR, Estimated: 47 mL/min — ABNORMAL LOW (ref >60)
Glucose, Bld: 91 mg/dL (ref 70–99)
Potassium: 5 mmol/L (ref 3.5–5.1)
Sodium: 143 mmol/L (ref 135–145)

## 2021-06-15 LAB — CBC
HCT: 31.9 % — ABNORMAL LOW (ref 36.0–46.0)
Hemoglobin: 10.2 g/dL — ABNORMAL LOW (ref 12.0–15.0)
MCH: 29.7 pg (ref 26.0–34.0)
MCHC: 32 g/dL (ref 30.0–36.0)
MCV: 93 fL (ref 80.0–100.0)
Platelets: 135 10*3/uL — ABNORMAL LOW (ref 150–400)
RBC: 3.43 MIL/uL — ABNORMAL LOW (ref 3.87–5.11)
RDW: 14.8 % (ref 11.5–15.5)
WBC: 7 10*3/uL (ref 4.0–10.5)
nRBC: 0 % (ref 0.0–0.2)

## 2021-06-15 MED ORDER — MIRTAZAPINE 15 MG PO TBDP
15.0000 mg | ORAL_TABLET | Freq: Every day | ORAL | Status: DC
  Filled 2021-06-15 (×2): qty 1

## 2021-06-15 MED ORDER — ENOXAPARIN SODIUM 80 MG/0.8ML IJ SOSY
65.0000 mg | PREFILLED_SYRINGE | Freq: Two times a day (BID) | INTRAMUSCULAR | Status: DC
  Administered 2021-06-17 – 2021-06-19 (×5): 65 mg via SUBCUTANEOUS
  Filled 2021-06-15 (×7): qty 0.8

## 2021-06-15 MED ORDER — ACETAMINOPHEN 160 MG/5ML PO SOLN
500.0000 mg | ORAL | Status: DC | PRN
  Filled 2021-06-15: qty 20.3

## 2021-06-15 MED ORDER — THIAMINE HCL 100 MG PO TABS
100.0000 mg | ORAL_TABLET | Freq: Three times a day (TID) | ORAL | Status: DC
  Administered 2021-06-15 – 2021-06-18 (×8): 100 mg
  Filled 2021-06-15 (×9): qty 1

## 2021-06-15 MED ORDER — POLYETHYLENE GLYCOL 3350 17 G PO PACK
17.0000 g | PACK | Freq: Every day | ORAL | Status: DC
  Filled 2021-06-15 (×2): qty 1

## 2021-06-15 MED ORDER — SODIUM BICARBONATE 650 MG PO TABS
650.0000 mg | ORAL_TABLET | Freq: Two times a day (BID) | ORAL | Status: DC
  Administered 2021-06-16 – 2021-06-19 (×6): 650 mg
  Filled 2021-06-15 (×8): qty 1

## 2021-06-15 MED ORDER — SEVELAMER CARBONATE 800 MG PO TABS
800.0000 mg | ORAL_TABLET | Freq: Three times a day (TID) | ORAL | Status: DC
  Administered 2021-06-15: 800 mg
  Filled 2021-06-15 (×2): qty 1

## 2021-06-15 MED ORDER — LORAZEPAM 2 MG/ML IJ SOLN
0.5000 mg | Freq: Once | INTRAMUSCULAR | Status: AC | PRN
  Administered 2021-06-15: 0.5 mg via INTRAVENOUS
  Filled 2021-06-15: qty 1

## 2021-06-15 MED ORDER — ADULT MULTIVITAMIN LIQUID CH
15.0000 mL | Freq: Every day | ORAL | Status: DC
  Administered 2021-06-17 – 2021-06-18 (×2): 15 mL
  Filled 2021-06-15 (×3): qty 15

## 2021-06-15 MED ORDER — ACETAMINOPHEN 650 MG RE SUPP: 650.0000 mg | RECTAL | Status: DC | PRN

## 2021-06-15 NOTE — Progress Notes (Addendum)
Pt agreed to have NGT placed. Spouse and daughter at bedside and supportive. RN attempted NGT x1 and pt demanded for it to be removed. NGT removed before being fully inserted. Family witnessed event and thanked Therapist, sports for trying. Pt stated she does not want the tube and her answer will always be no.   RN suggest cortrak as it is much smaller and be used same as NGT. Pt may need light sedative to relax. Otherwise, the pt will not allow any tube insertion.

## 2021-06-15 NOTE — Consult Note (Signed)
[facility] Face-to-Face Psychiatry Consult   Reason for Consult:  Depression Referring Physician:  Wynetta Fines, MD Patient Identification: Amber [name] MRN:  [id number] Principal Diagnosis: Major depressive disorder, recurrent episode, severe ([place]) Diagnosis:  Principal Problem:   Major depressive disorder, recurrent episode, severe ([place]) Active Problems:   Failure to thrive in adult   Hypernatremia   Dysphagia   COVID   Total Time spent with patient: 20 minutes  Subjective:   Amber [name] is a 76 y.o. female patient admitted with failure to thrive.  HPI:  76 y.o. 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  Also found to be COVID positive.  Psych consult placed due to depression, SI.  Assessment 76 y.o. 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 She has been refusing medications, food and losing weight.  Does not understand the implications of refusing food and medications.  She is paranoid and doesn't trust people.  Patient refused barium swallow and VQ scan and now needs CT pelvis to rule out adnexal mass. Per NP's note daughter reported that patient had been having some psychotic symptoms, such as hallucinations, paranoia, answering yes and no by counting words in questions.  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  Started on low-dose Haldol from 1/18.  Recommend inpatient geriatric psychiatric admission after she is medically stabilized.  Patient is still refusing medications and recently refused NG tube.  Capacity evaluation  In an evaluation of capacity, each of the following criteria must be met based on medical necessity in order for a patient to have capacity to make the decision in question. Of note, the  capacity evaluation assesses only for the specified decision documented above and is not a determination of the patient's overall competency, which can only be adjudicated.  Criterion 1: The patient demonstrates a clear and consistent voluntary choice with regard to treatment options. No  Criterion 2: The patient adequately understands the disease they have, the treatment proposed, the risks of treatment, and the risks of other treatment (including no treatment). No  Criterion 3: The patient acknowledges that the details of Criterion 2 apply to them specifically and the likely consequences of treatment options proposed. No, knows that she can die but doesn't want to die.   Criterion 4: The patient demonstrates adequate reasoning/rationality within the context of their decision and can provide justification for their choice. No , states " I cannot swallow the tube".   In this case, the patient doesn't have capacity to decide NG tube placement.  . See patient interview below for details.   Recommendations: -Can switch to tele sitter for safety. -Patient does not have capacity to decide NG/ Core track tube placement. -If NG tube is placed, she may need restraints to avoid pulling NG/ core track tube.  -Continue Mirtazapine 15 mg SOL-TAB at bedtime for depression, sleep and appetite stimulation - continue 0.5 mg IV haldol QHS.  - Optimized treatment for underline medical issues, rehydration and feeding(FTT) - Recommend Geriatric inpatient psychiatric admission after patient is medically cleared.  Thank you for this consult. Psychiatry service will continue to follow.    Subjective: Patient is seen and examined today.  Patient lying in her bed.  She reports that her mood is not good and she feels  depressed.  She states she cannot swallow tube so she refused it.  She states " I cannot do this, I cannot even go to the bathroom, I have nobody left".  Discussed what would happen if she would refuse  tube.  She states "I won't to get nutrition and i'll die".  When asked if this is what she wants? .  She states "no I do not want to die".  She was not able to give me any rationale for refusing NG tube other than just " I cannot swallow". She reports poor sleep and appetite. She states she refused medications as she was having abdominal pain.  When asked why she refused IV Haldol and Remeron, she states she refused it because she was having abdominal pain. She denies active or passive suicidal ideation, homicidal ideation, auditory and visual hallucinations.  She denies any paranoia. She becomes irritable and  states she needs to be alone and she is sick of all these question. Patient is oriented x3.   Talked to daughter  on 06/13/21- [place] @[phone] with her about collateral information given to nurse.  Discussed risks and benefits of starting low-dose IV Haldol nightly.  Daughter agrees with the plan and gives consent to start Haldol.  She agrees with Amber [name] psych admission after medical stabilization.  Past Psychiatric History: as above  Risk to Self:  yes, by refusing food, meds Risk to Others: No Prior Inpatient Therapy:  None in the past Prior Outpatient Therapy:  PCP  Past Medical History:  Past Medical History:  Diagnosis Date   Anxiety    Hypertension    No past surgical history on file. Family History:  Family History  Problem Relation Age of Onset   High blood pressure Maternal Grandmother    Family Psychiatric  History:  Social History:  Social History   Substance and Sexual Activity  Alcohol Use Not Currently     Social History   Substance and Sexual Activity  Drug Use Not Currently    Social History   Socioeconomic History   Marital status: Married    Spouse name: Not on file   Number of children: Not on file   Years of education: Not on file   Highest education level: Not on file  Occupational History   Not on file  Tobacco Use    Smoking status: Former    Types: Cigarettes   Smokeless tobacco: Never  Substance and Sexual Activity   Alcohol use: Not Currently   Drug use: Not Currently   Sexual activity: Not Currently  Other Topics Concern   Not on file  Social History Narrative   Right handed    one story home    lives with family    Doesn't work currently   Social Determinants of Radio broadcast assistant Strain: Not on file  Food Insecurity: Not on file  Transportation Needs: Not on file  Physical Activity: Not on file  Stress: Not on file  Social Connections: Not on file   Additional Social History:    Allergies:   Allergies  Allergen Reactions   Shellfish Allergy     "I pass out"    Labs:  Results for orders placed or performed during the hospital encounter of 06/08/21 (from the past 48 hour(s))  Glucose, capillary     Status: None   Collection Time: 06/15/21  4:00 AM  Result Value Ref Range   Glucose-Capillary 84 70 - 99 mg/dL  Comment: Glucose reference range applies only to samples taken after fasting for at least 8 hours.  Basic metabolic panel     Status: Abnormal   Collection Time: 06/15/21  4:29 AM  Result Value Ref Range   Sodium 143 135 - 145 mmol/L   Potassium 5.0 3.5 - 5.1 mmol/L   Chloride 116 (H) 98 - 111 mmol/L   CO2 20 (L) 22 - 32 mmol/L   Glucose, Bld 91 70 - 99 mg/dL    Comment: Glucose reference range applies only to samples taken after fasting for at least 8 hours.   BUN 12 8 - 23 mg/dL   Creatinine, Ser 1.21 (H) 0.44 - 1.00 mg/dL   Calcium 9.5 8.9 - 10.3 mg/dL   GFR, Estimated 47 (L) >60 mL/min    Comment: (NOTE) Calculated using the CKD-EPI Creatinine Equation (2021)    Anion gap 7 5 - 15    Comment: Performed at [facility] [address], [place], [place] [address]  CBC     Status: Abnormal   Collection Time: 06/15/21  4:29 AM  Result Value Ref Range   WBC 7.0 4.0 - 10.5 K/uL   RBC 3.43 (L) 3.87 - 5.11 MIL/uL   Hemoglobin 10.2 (L) 12.0 -  15.0 g/dL   HCT 31.9 (L) 36.0 - 46.0 %   MCV 93.0 80.0 - 100.0 fL   MCH 29.7 26.0 - 34.0 pg   MCHC 32.0 30.0 - 36.0 g/dL   RDW 14.8 11.5 - 15.5 %   Platelets 135 (L) 150 - 400 K/uL    Comment: REPEATED TO VERIFY   nRBC 0.0 0.0 - 0.2 %    Comment: Performed at [facility], [place] [address], [place], [place] [address]  Glucose, capillary     Status: None   Collection Time: 06/15/21  8:22 AM  Result Value Ref Range   Glucose-Capillary 84 70 - 99 mg/dL    Comment: Glucose reference range applies only to samples taken after fasting for at least 8 hours.    Current Facility-Administered Medications  Medication Dose Route Frequency Provider Last Rate Last Admin   acetaminophen (TYLENOL) 160 MG/5ML solution 500 mg  500 mg Oral Q4H PRN Shalhoub, Sherryll Burger, MD   500 mg at 06/09/21 1204   Or   acetaminophen (TYLENOL) suppository 650 mg  650 mg Rectal Q4H PRN Shalhoub, Sherryll Burger, MD   650 mg at 06/09/21 0021   bisacodyl (DULCOLAX) suppository 10 mg  10 mg Rectal Daily PRN Kc, Maren Beach, MD       dextrose 5 % and 0.45 % NaCl with KCl 20 mEq/L infusion   Intravenous Continuous Kc, Maren Beach, MD 30 mL/hr at 06/15/21 0606 Infusion Verify at 06/15/21 0606   enoxaparin (LOVENOX) injection 65 mg  65 mg Subcutaneous Q12H Kc, Ramesh, MD       feeding supplement (ENSURE ENLIVE / ENSURE PLUS) liquid 237 mL  237 mL Oral TID BM Kc, Ramesh, MD       feeding supplement (OSMOLITE 1.2 CAL) liquid 1,000 mL  1,000 mL Per Tube Continuous Kc, Ramesh, MD       haloperidol lactate (HALDOL) injection 0.5 mg  0.5 mg Intravenous QHS Ieisha Gao, MD   0.5 mg at 06/14/21 0000   mirtazapine (REMERON SOL-TAB) disintegrating tablet 15 mg  15 mg Oral QHS Akintayo, Mojeed, MD   15 mg at 06/12/21 2216   multivitamin with minerals tablet 1 tablet  1 tablet Oral Daily Kc,  Maren Beach, MD       ondansetron [facility]) tablet 4 mg  4 mg Oral Q6H PRN Amber [name] T, MD       Or   ondansetron [facility]) injection 4 mg  4 mg Intravenous Q6H PRN  Amber [name] T, MD   4 mg at 06/08/21 2230   polyethylene glycol (MIRALAX / GLYCOLAX) packet 17 g  17 g Oral Daily Kc, Ramesh, MD       sevelamer carbonate (RENVELA) tablet 800 mg  800 mg Oral TID WC Amber [name] T, MD       sodium bicarbonate tablet 650 mg  650 mg Oral BID Amber [name] T, MD   650 mg at 06/11/21 2247   tamsulosin (FLOMAX) capsule 0.4 mg  0.4 mg Oral QPC supper Kc, Maren Beach, MD       thiamine tablet 100 mg  100 mg Oral TID Pham, Minh Q, RPH-CPP       Vitamin D (Ergocalciferol) (DRISDOL) capsule 50,000 Units  50,000 Units Oral Q7 days Lequita Halt, MD        Musculoskeletal: Strength & Muscle Tone:  unable to assess, patient uncooperative Gait & Station: unsteady Patient leans: N/A     Psychiatric Specialty Exam:  Presentation  General Appearance: Appropriate for Environment  Eye Contact:Minimal  Speech:Clear and Coherent  Speech Volume:Decreased  Handedness:Right   Mood and Affect  Mood:Irritable; Depressed  Affect:Constricted   Thought Process  Thought Processes:Linear  Descriptions of Associations:Intact  Orientation:Full (Time, Place and Person)  Thought Content:-- (no SI, HI, AVH)  History of Schizophrenia/Schizoaffective disorder:No  Duration of Psychotic Symptoms:Less than six months  Hallucinations:Hallucinations: None   Ideas of Reference:None  Suicidal Thoughts:Suicidal Thoughts: No   Homicidal Thoughts:Homicidal Thoughts: No    Sensorium  Memory:Immediate Fair; Recent Poor; Remote Poor  Judgment:Poor  Insight:Poor   Executive Functions  Concentration:Fair  Attention Span:Fair  Recall:-- (Unable to assess, Pt uncooperative)  Fund of Knowledge:Poor  Language:Fair   Psychomotor Activity  Psychomotor Activity:Psychomotor Activity: Decreased; Psychomotor Retardation    Assets  Assets:Social Support   Sleep  Sleep:Sleep: Fair    Physical Exam: Physical Exam Vitals reviewed.  Constitutional:       Appearance: She is not toxic-appearing.  HENT:     Head: Normocephalic.  Pulmonary:     Effort: Pulmonary effort is normal.  Neurological:     Mental Status: She is alert and oriented to person, place, and time.   Review of Systems  Psychiatric/Behavioral:  Positive for depression. Negative for hallucinations and suicidal ideas. The patient is not nervous/anxious.   Blood pressure 113/79, pulse (!) 108, temperature 98.1 F (36.7 C), resp. rate 18, height $RemoveBe'5\' 6"'[id number]$  (1.676 m), weight 64.3 kg, SpO2 96 %. Body mass index is 22.88 kg/m.  Disposition: Recommend psychiatric Inpatient admission when medically cleared. Supportive therapy provided about ongoing stressors. Psychiatric service will follow  Armando Reichert, MD PGy2 06/15/2021 9:58 AM

## 2021-06-15 NOTE — Progress Notes (Addendum)
PROGRESS NOTE    Amber [name]  GUY:[id number] DOB: 03/23/1946 DOA: 06/08/2021 PCP: Lindell Spar, MD   Brief Narrative/Hospital Course: Amber [name], 76 y.o. 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  As per daughter patient started having swallowing problem for several weeks with dry heaves after eating solid food, episodes of confusion present hallucination and complaining of feeling something stuck in the throat when swallowing solid food and choked x2 and has refused to eat any solid food past week and mainly drinking water,  patient also complained "Has had enough and wanted to end her life". Husband had covid 3 wks ago and patient  had sore throat and stuffy nose that time.Her  symptoms onset from October 2022, and daughter reports that patient was seen by neurology and had work up with MRI brain, MR venogram CT venogram November 04/24/21-no acute stroke, no dural venous sinus thrombosis.  According to PCPs note, patient was worked up recently for elevated creatinine levels, renal ultrasound showed slight left-sided hydronephrosis.  Pelvis ultrasound showed mass, for which patient was referred to see outpatient OB/GYN.  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  And patient was started on vitamin D supplement however family reported patient has not been compliant with any of her pills.  PCP also tried Remeron and Ranexa, and patient has not been compliant either In the ED was hypotensive tachycardic labs with lactic acidosis 6.1, elevated creatinine 1.8 hypernatremia 149 hypercalcemia 11.6.  Was given 2 L bolus LR, blood pressure improved, also placed on broad-spectrum antibiotics.  CT head chronic atrophy with white matter microvascular disease, chest x-ray no  active disease Renal ultrasound 2.2 cm right renal calculus and right renal pain  with mild right-sided hydronephrosis.COVID-19 positive.  Patient has had episodes of depression over the years,patient started becoming paranoid at the beginning of the pandemic was very fearful, as she was a heavy smoker and this led to her being more isolated from people and even her own family.  Patient had significant decline past 2 months once he became increasingly paranoid worsening hallucinations declining oral intake becoming bedridden and decided she was unable to walk unable to swallow would not get up to go to the restroom using adult diapers.  She has a neurology evaluation on November 2022 w/ neg MRI.  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  Reluctant to take p.o. refusing PO med. Cont w/ IV fluids, PPI, nutrition supplement.  Did not tolerate/refused NG tube insertion yesterday at bedside, We will plan for core track placement today if she agrees, may try restraint and NG tube if family agreeable ( as per psych has no capacity for medical decision abt NG tube).  Discussed with patient's daughter- they want to proceed with cortrak tube while waiting for [facility] psych bed and can administer nutrition and medication will order small dose Ativan and bilateral wrist restraint.  We can probably discontinue core track tube prior to discharge to [facility].  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 And  planning for [facility] psych admission.Patient refusing to answer questions.patient is refusing further care  psychiatric  following.  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  Mild confusion-mild acute metabolic encephalopathy:multifactorial in the setting of multiple medical comorbidities, dehydration, dysphagia,COVID-19 infection psych issues.  She is alert awake oriented x3 at this time. S/p high-dose thiamine.She has had extensive neuro work up including MRI brain, MR venogram CT venogram 04/24/21 as outpatient-no acute stroke, no dural venous sinus thrombosis. CT head in ED no acute finding, no obvious evidence of infection chest x-ray.UA leukocytes trace, WBC 6-10,culture-1000 colonies coccobacillus(not significant)  Right distal femoral vein age indeterminate nonocclusive DVT Chronic nonocclusive DVT left popliteal vein Elevated D-dimer, in the setting of COVID: Continue therapeutic Lovenox, refused VQ scan.Discussed with the daughter agreed for anticoagulation and will plan for Eliquis on discharge. Of note she does not have insurance.  AKI on CKDIIIb Dehydration  baseline creatinine 1.5, prerenal AKI 2/2 hypotension volume depletion from poor oral intake.  ultrasound with bilateral non-obstructive renal calculus and mild right-sided hydronephrosis.  AKI improved.    Continue IV fluid hydration,Hopefull she will allow NGT feeding Recent Labs  Lab 06/09/21 0301 06/10/21 0800 06/11/21 0606 06/12/21 0324 06/15/21 0429  BUN 39* 27* 20 16 12   CREATININE 1.43* 1.24* 1.12* 1.13* 1.21*    Lactic acidosis, metabolic acidosis:due to hypotension dehydration. Resolved Hypernatremia: resolved  Hypercalcemia Secondary hyperparathyroidism Vitamin D deficiency: She has had recent work-up, calcium improved to 10> repeat stable, likely in the setting of volume epletion,Secondary to hyper parathyroidism from outpatient work-up but also had vitamin D deficiency, continue to replete.  Hypokalemia/hypophosphatemia/hypomagnesemia: Electrolytes are stable  Vague chest/abdomen pain:overnight EKG nonischemic changes, repeat  troponin downtrending. Elevated troponin:Trop:96>101, ?demand ischemia due to tachycardia hypotension renal failure on presentation. TTE-EF 45-50%, no R WMA, G1 DD, normal RV systolic function size and normal pulmonary artery systolic pressure.  COVID-19 infection, high risk of decompensation, unvaccinated, s/p Remdesivir x3 days currently not hypoxic no respiratory symptoms.CRP stable D-dimer elevated and downtrending.  Cont Lovenox for DVT.   Swelling of left upper arm:no DVT on duplex.keep it elevated.  Left uterine 3.9x2.9 cm fibroid:CT abd done 1/19- strongly favors fibroid although can be characterized with contrast enhanced MRI- will ask her to see PCP for that. No lymphadenoapthy. Fecal impaction-added rectal bisacodyl, miralax daily Urine retention-added flomax, in and out cath as needed and treat constpation   GOC: DNR.  Palliative care following.  Patient is refusing to eat and refusing to participate in the care difficult situation moving forward unclear if she will allow for NG tube.  Psychiatry following .  Social worker looking into [facility] psych facility  and once available we can discharge her.  DVT prophylaxis: lovenox therapeutic dose. Code Status:   Code Status: DNR Family Communication: plan of care discussed with patient.  Had discussed with daughter again in detail today.  Status is: Inpatient Remains inpatient appropriate because: For ongoing management of failure to thrive Disposition: Currently medically stable for discharge. Anticipated Disposition: For now patient will need geriatric psychiatric unit.  Total time spent in the care of this patient  35  MINUTES Objective: Vitals last 24 hrs: Vitals:   06/14/21 2035 06/15/21 0407 06/15/21 0410 06/15/21 0851  BP: 112/72 121/75  113/79  Pulse: 98 88  (!) 108  Resp: 18 18  18   Temp: 98.3 F (36.8 C) 98.4 F (36.9 C)  98.1 F (36.7 C)  TempSrc: Oral Oral  SpO2: 100% 95%  96%  Weight:   64.3 kg   Height:        Weight change:   Intake/Output Summary (Last 24 hours) at 06/15/2021 1111 Last data filed at 06/15/2021 0901 Gross per 24 hour  Intake 1154.46 ml  Output 300 ml  Net 854.46 ml   Net IO Since Admission: 1,968.14 mL [06/15/21 1111]   Physical Examination: General exam: AAOx3, older than stated age, weak appearing. HEENT:Oral mucosa moist, Ear/Nose WNL grossly, dentition normal. Respiratory system: bilaterally clear no use of accessory muscle Cardiovascular system: S1 & S2 +, No JVD,. Gastrointestinal system: Abdomen soft, NT,ND, BS+ Nervous System:Alert, awake, moving extremities and grossly nonfocal Extremities: no edema, distal peripheral pulses palpable.  Skin: No rashes,no icterus. MSK: Normal muscle bulk,tone, power   Medications reviewed:  Scheduled Meds:  enoxaparin (LOVENOX) injection  65 mg Subcutaneous Q12H   feeding supplement  237 mL Oral TID BM   haloperidol lactate  0.5 mg Intravenous QHS   mirtazapine  15 mg Oral QHS   multivitamin with minerals  1 tablet Oral Daily   polyethylene glycol  17 g Oral Daily   sevelamer carbonate  800 mg Oral TID WC   sodium bicarbonate  650 mg Oral BID   tamsulosin  0.4 mg Oral QPC supper   thiamine  100 mg Oral TID   Vitamin D (Ergocalciferol)  50,000 Units Oral Q7 days   Continuous Infusions:  feeding supplement (OSMOLITE 1.2 CAL)     Diet Order             DIET SOFT Room service appropriate? Yes; Fluid consistency: Thin  Diet effective now                 Weight change:   Wt Readings from Last 3 Encounters:  06/15/21 64.3 kg  04/25/21 71.2 kg  04/24/21 70.8 kg     Consultants:see note  Procedures:see note Antimicrobials: Anti-infectives (From admission, onward)    Start     Dose/Rate Route Frequency Ordered Stop   06/10/21 2000  vancomycin (VANCOCIN) IVPB 1000 mg/200 mL premix  Status:  Discontinued        1,000 mg 200 mL/hr over 60 Minutes Intravenous Every 36 hours 06/08/21 1834 06/08/21 1843    06/10/21 1000  remdesivir 100 mg in sodium chloride 0.9 % 100 mL IVPB       See Hyperspace for full Linked Orders Report.   100 mg 200 mL/hr over 30 Minutes Intravenous Daily 06/09/21 1010 06/11/21 1124   06/09/21 1130  remdesivir 200 mg in sodium chloride 0.9% 250 mL IVPB       See Hyperspace for full Linked Orders Report.   200 mg 580 mL/hr over 30 Minutes Intravenous Once 06/09/21 1010 06/09/21 1224   06/09/21 0600  ceFEPIme (MAXIPIME) 2 g in sodium chloride 0.9 % 100 mL IVPB  Status:  Discontinued        2 g 200 mL/hr over 30 Minutes Intravenous Every 12 hours 06/08/21 1834 06/08/21 1843   06/08/21 1630  vancomycin (VANCOREADY) IVPB 1500 mg/300 mL  Status:  Discontinued        1,500 mg 150 mL/hr over 120 Minutes Intravenous  Once 06/08/21 1624 06/08/21 1843   06/08/21 1615  ceFEPIme (MAXIPIME) 2 g in sodium chloride 0.9 % 100 mL IVPB        2 g 200 mL/hr over 30 Minutes Intravenous  Once 06/08/21 1609 06/08/21 1819   06/08/21 1615  metroNIDAZOLE (FLAGYL) IVPB  500 mg        500 mg 100 mL/hr over 60 Minutes Intravenous  Once 06/08/21 1609 06/08/21 1841   06/08/21 1615  vancomycin (VANCOCIN) IVPB 1000 mg/200 mL premix  Status:  Discontinued        1,000 mg 200 mL/hr over 60 Minutes Intravenous  Once 06/08/21 1609 06/08/21 1624      Culture/Microbiology    Component Value Date/Time   SDES IN/OUT CATH URINE 06/09/2021 2318   SPECREQUEST NONE 06/09/2021 2318   CULT (A) 06/09/2021 2318    1,000 COLONIES/mL GRAM POSITIVE COCCOBACILLUS Standardized susceptibility testing for this organism is not available. Performed at [facility], [place] [address] [place], [place] [address]    REPTSTATUS 06/12/2021 FINAL 06/09/2021 2318    Other culture-see note  Unresulted Labs (From admission, onward)    None     Data Reviewed: I have personally reviewed following labs and imaging studies CBC: Recent Labs  Lab 06/08/21 1548 06/09/21 0301 06/10/21 0800 06/11/21 0606  06/12/21 0324 06/15/21 0429  WBC 9.7 8.6 5.3 4.4 4.9 7.0  NEUTROABS 6.3  --   --   --   --   --   HGB 12.7 9.9* 10.9* 9.7* 10.3* 10.2*  HCT 40.5 29.7* 33.5* 29.8* 31.5* 31.9*  MCV 94.2 90.8 89.8 92.8 92.4 93.0  PLT 206 120* 111* PLATELET CLUMPS NOTED ON SMEAR, UNABLE TO ESTIMATE 93* 332*   Basic Metabolic Panel: Recent Labs  Lab 06/08/21 2243 06/09/21 0301 06/10/21 0800 06/10/21 1520 06/11/21 0606 06/12/21 0324 06/15/21 0429  NA  --  143 147*  --  139 143 143  K  --  3.0* 2.5* 3.4* 4.2 4.1 5.0  CL  --  112* 112*  --  117* 114* 116*  CO2  --  25 24  --  16* 19* 20*  GLUCOSE  --  126* 67*  --  81 81 91  BUN  --  39* 27*  --  20 16 12   CREATININE  --  1.43* 1.24*  --  1.12* 1.13* 1.21*  CALCIUM  --  10.0 10.0  --  8.4* 9.4 9.5  MG  --   --  1.4*  --  2.0  --   --   PHOS 1.6*  --  1.4*  --  2.6  --   --    GFR: Estimated Creatinine Clearance: 38.8 mL/min (A) (by C-G formula based on SCr of 1.21 mg/dL (H)). Liver Function Tests: Recent Labs  Lab 06/08/21 1548 06/10/21 0800 06/11/21 0606 06/12/21 0324  AST 22 29 16 18   ALT 14 26 19 17   ALKPHOS 40 42 33* 37*  BILITOT 2.0* 1.4* 1.0 0.7  PROT 6.4* 5.5* 4.2* 4.2*  ALBUMIN 3.6 2.9* 2.2* 2.2*   No results for input(s): LIPASE, AMYLASE in the last 168 hours. No results for input(s): AMMONIA in the last 168 hours. Coagulation Profile: Recent Labs  Lab 06/08/21 1529  INR 1.1   Cardiac Enzymes: Recent Labs  Lab 06/08/21 2243  CKTOTAL 13*   BNP (last 3 results) No results for input(s): PROBNP in the last 8760 hours. HbA1C: No results for input(s): HGBA1C in the last 72 hours. CBG: Recent Labs  Lab 06/08/21 1545 06/15/21 0400 06/15/21 0822  GLUCAP 156* 84 84   Lipid Profile: No results for input(s): CHOL, HDL, LDLCALC, TRIG, CHOLHDL, LDLDIRECT in the last 72 hours. Thyroid Function Tests: No results for input(s): TSH, T4TOTAL, FREET4, T3FREE, THYROIDAB in the last 72  hours.  Anemia Panel: No results for  input(s): VITAMINB12, FOLATE, FERRITIN, TIBC, IRON, RETICCTPCT in the last 72 hours. Sepsis Labs: Recent Labs  Lab 06/08/21 1549 06/08/21 2049 06/09/21 1028  PROCALCITON  --   --  0.16  LATICACIDVEN 6.4* 3.7*  --     Recent Results (from the past 240 hour(s))  Resp Panel by RT-PCR (Flu A&B, Covid) Nasopharyngeal Swab     Status: Abnormal   Collection Time: 06/08/21  4:08 PM   Specimen: Nasopharyngeal Swab; Nasopharyngeal(NP) swabs in vial transport medium  Result Value Ref Range Status   SARS Coronavirus 2 by RT PCR POSITIVE (A) NEGATIVE Final    Comment: (NOTE) SARS-CoV-2 target nucleic acids are DETECTED.  The SARS-CoV-2 RNA is generally detectable in upper respiratory specimens during the acute phase of infection. Positive results are indicative of the presence of the identified virus, but do not rule out bacterial infection or co-infection with other pathogens not detected by the test. Clinical correlation with patient history and other diagnostic information is necessary to determine patient infection status. The expected result is Negative.  Fact Sheet for Patients: EntrepreneurPulse.com.au  Fact Sheet for Healthcare Providers: IncredibleEmployment.be  This test is not yet approved or cleared by the [place] FDA and  has been authorized for detection and/or diagnosis of SARS-CoV-2 by FDA under an Emergency Use Authorization (EUA).  This EUA will remain in effect (meaning this test can be used) for the duration of  the COVID-19 declaration under Section 564(b)(1) of the A ct, 21 U.S.C. section 360bbb-3(b)(1), unless the authorization is terminated or revoked sooner.     Influenza A by PCR NEGATIVE NEGATIVE Final   Influenza B by PCR NEGATIVE NEGATIVE Final    Comment: (NOTE) The Xpert Xpress SARS-CoV-2/FLU/RSV plus assay is intended as an aid in the diagnosis of influenza from Nasopharyngeal swab specimens and should not be  used as a sole basis for treatment. Nasal washings and aspirates are unacceptable for Xpert Xpress SARS-CoV-2/FLU/RSV testing.  Fact Sheet for Patients: EntrepreneurPulse.com.au  Fact Sheet for Healthcare Providers: IncredibleEmployment.be  This test is not yet approved or cleared by the [place] FDA and has been authorized for detection and/or diagnosis of SARS-CoV-2 by FDA under an Emergency Use Authorization (EUA). This EUA will remain in effect (meaning this test can be used) for the duration of the COVID-19 declaration under Section 564(b)(1) of the Act, 21 U.S.C. section 360bbb-3(b)(1), unless the authorization is terminated or revoked.  Performed at [facility], [place] [address], [place], [place] [address]   Blood culture (routine x 2)     Status: None   Collection Time: 06/08/21  4:45 PM   Specimen: BLOOD RIGHT HAND  Result Value Ref Range Status   Specimen Description BLOOD RIGHT HAND  Final   Special Requests   Final    BOTTLES DRAWN AEROBIC AND ANAEROBIC Blood Culture results may not be optimal due to an inadequate volume of blood received in culture bottles   Culture   Final    NO GROWTH 5 DAYS Performed at [facility], [place] [address], [place], [place] [address]    Report Status 06/13/2021 FINAL  Final  Blood culture (routine x 2)     Status: None   Collection Time: 06/08/21  8:37 PM   Specimen: BLOOD RIGHT HAND  Result Value Ref Range Status   Specimen Description BLOOD RIGHT HAND  Final   Special Requests   Final    BOTTLES DRAWN  AEROBIC AND ANAEROBIC Blood Culture results may not be optimal due to an inadequate volume of blood received in culture bottles   Culture   Final    NO GROWTH 5 DAYS Performed at [facility], [place] [address], [place], [place] [address]    Report Status 06/13/2021 FINAL  Final  Urine Culture     Status: Abnormal   Collection Time: 06/09/21 11:18 PM   Specimen: In/Out Cath  Urine  Result Value Ref Range Status   Specimen Description IN/OUT CATH URINE  Final   Special Requests NONE  Final   Culture (A)  Final    1,000 COLONIES/mL GRAM POSITIVE COCCOBACILLUS Standardized susceptibility testing for this organism is not available. Performed at [facility], [place] [address] [place], [place] [address]    Report Status 06/12/2021 FINAL  Final      Radiology Studies: CT ABDOMEN PELVIS W CONTRAST  Result Date: 06/14/2021 CLINICAL DATA:  Left adnexal mass, dysphagia, malignancy suspected EXAM: CT ABDOMEN AND PELVIS WITH CONTRAST TECHNIQUE: Multidetector CT imaging of the abdomen and pelvis was performed using the standard protocol following bolus administration of intravenous contrast. RADIATION DOSE REDUCTION: This exam was performed according to the departmental dose-optimization program which includes automated exposure control, adjustment of the mA and/or kV according to patient size and/or use of iterative reconstruction technique. CONTRAST:  168mL OMNIPAQUE IOHEXOL 300 MG/ML  SOLN COMPARISON:  CT abdomen pelvis, 03/05/2021, pelvic ultrasound, 04/11/2021 FINDINGS: Lower chest: No acute abnormality. Hepatobiliary: No solid liver abnormality is seen. Hepatic steatosis. No gallstones, gallbladder wall thickening, or biliary dilatation. Pancreas: Unremarkable. No pancreatic ductal dilatation or surrounding inflammatory changes. Spleen: Normal in size without significant abnormality. Adrenals/Urinary Tract: Adrenal glands are unremarkable. Large calculus in the right renal pelvis measuring 1.5 cm (series 4, image 32). Additional punctuate nonobstructive calculi in the inferior pole of left kidney (series 7, image 49). No hydronephrosis. Bladder is unremarkable. Stomach/Bowel: Stomach is within normal limits. Appendix appears normal. Descending and sigmoid diverticulosis. Large stool ball in the rectum measuring at least 7.3 cm, with mild circumferential rectal wall  thickening and adjacent fat stranding (series 4, image 78). Vascular/Lymphatic: Aortic atherosclerosis. No enlarged abdominal or pelvic lymph nodes. Reproductive: Partially exophytic mass of the left aspect of the uterine fundus, with homogeneous enhancement and texture to the adjacent myometrium, measuring 3.9 x 2.9 cm (series 4, image 69). This is additionally clearly distinct from the normal left ovary (series 4, image 62). Other: No abdominal wall hernia or abnormality. No ascites. Musculoskeletal: No acute or significant osseous findings. IMPRESSION: 1. Partially exophytic mass of the left aspect of the uterine fundus, with homogeneous enhancement and texture to the adjacent myometrium, measuring 3.9 x 2.9 cm. This is additionally clearly distinct from the normal left ovary. Uterine fibroid is strongly favored, in keeping with findings of prior ultrasound, although this could be definitively characterized by contrast enhanced pelvic MRI on a nonemergent, outpatient basis if desired. 2. No evidence of lymphadenopathy or metastatic disease in the abdomen or pelvis. 3. Large stool ball in the rectum measuring at least 7.3 cm, with mild circumferential rectal wall thickening and adjacent fat stranding. Correlate for fecal impaction and stercoral colitis. 4. Nonobstructive bilateral nephrolithiasis. 5. Hepatic steatosis. 6. Descending and sigmoid diverticulosis without evidence of acute diverticulitis. Aortic Atherosclerosis (ICD10-I70.0). Electronically Signed   By: Delanna Ahmadi M.D.   On: 06/14/2021 08:18     LOS: 7 days   Antonieta Pert, MD [facility]  06/15/2021, 11:11  AM

## 2021-06-15 NOTE — Progress Notes (Signed)
Pt request pull ups for night. Family will provide them.

## 2021-06-15 NOTE — TOC Progression Note (Addendum)
Transition of Care [facility]) - Initial/Assessment Note    Patient Details  Name: Amber [name] MRN: [id number] Date of Birth: May 24, 1946  Transition [facility]) CM/SW Contact:    Milinda Antis, [place] Phone Number: 06/15/2021, 10:41 AM  Clinical Narrative:                 CSW contacted [facility] psych unit after MD asked if patient can be discharged with a NG tube or Cortrak.  CSW was informed that a patient cannot be admitted with either of those.  11:24- MD requested that patient's referral be faxed to [facility] psych units.  CSW faxed out referral  13:17-  CSW contacted [facility] and [facility] to ask if they have any beds available.  There was no answer and CSW was unable to leave a message.      Patient Goals and CMS Choice        Expected Discharge Plan and Services                                                Prior Living Arrangements/Services                       Activities of Daily Living      Permission Sought/Granted                  Emotional Assessment              Admission diagnosis:  Hypernatremia [E87.0] Failure to thrive in adult [R62.7] AKI (acute kidney injury) ([place]) [N17.9] Sepsis, due to unspecified organism, unspecified whether acute organ dysfunction present [facility]) [A41.9] Patient Active Problem List   Diagnosis Date Noted   Dysphagia    COVID    Major depressive disorder, recurrent episode, severe ([place]) 06/09/2021   Failure to thrive in adult 06/08/2021   Hypernatremia 06/08/2021   Primary hyperparathyroidism ([place]) 06/07/2021   Hyperthyroidism 04/25/2021   Encounter for examination following treatment at hospital 04/25/2021   Gastroesophageal reflux disease 04/25/2021   Gait abnormality 03/29/2021   Urinary incontinence 03/29/2021   Moderate protein-calorie malnutrition ([place]) 03/29/2021   Depression with anxiety 03/29/2021   Aortic atherosclerosis ([place]) 03/29/2021   Adnexal mass  03/29/2021   PCP:  Lindell Spar, MD Pharmacy:   [place], [place] [address] Phone: [phone] Fax: [phone]     Social Determinants of Health (SDOH) Interventions    Readmission Risk Interventions No flowsheet data found.

## 2021-06-15 NOTE — Progress Notes (Signed)
PT Cancellation Note  Patient Details Name: Amber [name] MRN: [id number] DOB: Oct 15, 1945   Cancelled Treatment:    Reason Eval/Treat Not Completed: Patient declined, no reason specified. PT attempts to see patient for treatment and pt reports she is too dizzy to do anything. PT provides education on the purpose of PT vestibular evaluation and how this may aide in creating a plan to reduce her dizziness, however the pt continues to refuse all mobility. PT attempts brief assessment of oculomotor function however this lasts <5 seconds before pt refuses further participation. Pt requests PT to not return, however it is likely in the patients best interest for a vestibular assessment to occur due to persistent dizziness.   Amber [name] 06/15/2021, 2:22 PM

## 2021-06-15 NOTE — Progress Notes (Signed)
Palliative-   Patient discussed in rounds. There are currently no interventions to be provided by Palliative at this time due to patient's ongoing psychiatric illness.  Palliative will sign off.   Mariana Kaufman, AGNP-C Palliative Medicine  No charge

## 2021-06-15 NOTE — Progress Notes (Signed)
[place] for enoxaparin Indication: DVT  Allergies  Allergen Reactions   Shellfish Allergy     "I pass out"    Patient Measurements: Height: 5\' 6"  (167.6 cm) Weight: 64.3 kg (141 lb 12.1 oz) IBW/kg (Calculated) : 59.3  Vital Signs: Temp: 98.4 F (36.9 C) (01/20 0407) Temp Source: Oral (01/20 0407) BP: 121/75 (01/20 0407) Pulse Rate: 88 (01/20 0407)  Labs: Recent Labs    06/15/21 0429  HGB 10.2*  HCT 31.9*  PLT 135*  CREATININE 1.21*     Estimated Creatinine Clearance: 38.8 mL/min (A) (by C-G formula based on SCr of 1.21 mg/dL (H)).   Medical History: Past Medical History:  Diagnosis Date   Anxiety    Hypertension       Assessment: 32 yoF admitted encephalopathic with COVID. 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 Cr stable, H/H ok.  Goal of Therapy:  Anti-Xa level 0.6-1 units/ml 4hrs after LMWH dose given Monitor platelets by anticoagulation protocol: Yes   Plan:  Decrease Enoxaparin to 65 mg sq BID Watch Cr and pltc closely  Thank you Anette Guarneri, PharmD 06/15/2021 8:27 AM

## 2021-06-15 NOTE — Procedures (Signed)
Cortrak  Person Inserting Tube:  Runell Kovich, Creola Corn, RD Tube Type:  Cortrak - 43 inches Tube Size:  10 Tube Location:  Left nare Initial Placement:  Stomach Secured by: Bridle Technique Used to Measure Tube Placement:  Marking at nare/corner of mouth Cortrak Secured At:  67 cm  Cortrak Tube Team Note:  Consult received to place a Cortrak feeding tube.   X-ray is required, abdominal x-ray has been ordered by the Cortrak team. Please confirm tube placement before using the Cortrak tube.   If the tube becomes dislodged please keep the tube and contact the Cortrak team at [email or web address] ([email or web address]) for replacement.  If after hours and replacement cannot be delayed, place a NG tube and confirm placement with an abdominal x-ray.     Theone Stanley., MS, RD, LDN (she/her/hers) RD pager number and weekend/on-call pager number located in [place].

## 2021-06-16 DIAGNOSIS — F323 Major depressive disorder, single episode, severe with psychotic features: Secondary | ICD-10-CM

## 2021-06-16 LAB — GLUCOSE, CAPILLARY
Glucose-Capillary: 112 mg/dL — ABNORMAL HIGH (ref 70–99)
Glucose-Capillary: 114 mg/dL — ABNORMAL HIGH (ref 70–99)
Glucose-Capillary: 122 mg/dL — ABNORMAL HIGH (ref 70–99)
Glucose-Capillary: 129 mg/dL — ABNORMAL HIGH (ref 70–99)
Glucose-Capillary: 134 mg/dL — ABNORMAL HIGH (ref 70–99)
Glucose-Capillary: 136 mg/dL — ABNORMAL HIGH (ref 70–99)
Glucose-Capillary: 142 mg/dL — ABNORMAL HIGH (ref 70–99)

## 2021-06-16 MED ORDER — MIRTAZAPINE 15 MG PO TABS: 7.5000 mg | ORAL_TABLET | Freq: Every day | ORAL | Status: DC

## 2021-06-16 MED ORDER — MIRTAZAPINE 15 MG PO TABS
7.5000 mg | ORAL_TABLET | Freq: Every day | ORAL | Status: DC
  Administered 2021-06-16 – 2021-06-18 (×3): 7.5 mg
  Filled 2021-06-16 (×3): qty 1

## 2021-06-16 NOTE — Progress Notes (Addendum)
PROGRESS NOTE    Amber [name]  FXT:[id number] DOB: [birth year] DOA: 06/08/2021 PCP: Lindell Spar, MD   Brief Narrative/Hospital Course: Amber [name], 76 y.o. 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  As per daughter patient started having swallowing problem for several weeks with dry heaves after eating solid food, episodes of confusion present hallucination and complaining of feeling something stuck in the throat when swallowing solid food and choked x2 and has refused to eat any solid food past week and mainly drinking water,  patient also complained "Has had enough and wanted to end her life". Husband had covid 3 wks ago and patient  had sore throat and stuffy nose that time.Her  symptoms onset from October 2022, and daughter reports that patient was seen by neurology and had work up with MRI brain, MR venogram CT venogram November 04/24/21-no acute stroke, no dural venous sinus thrombosis.  According to PCPs note, patient was worked up recently for elevated creatinine levels, renal ultrasound showed slight left-sided hydronephrosis.  Pelvis ultrasound showed mass, for which patient was referred to see outpatient OB/GYN.  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  And patient was started on vitamin D supplement however family reported patient has not been compliant with any of her pills.  PCP also tried Remeron and Ranexa, and patient has not been compliant either In the ED was hypotensive tachycardic labs with lactic acidosis 6.1, elevated creatinine 1.8 hypernatremia 149 hypercalcemia 11.6.  Was given 2 L bolus LR, blood pressure improved, also placed on broad-spectrum antibiotics.  CT head chronic atrophy with white matter microvascular disease, chest x-ray no  active disease Renal ultrasound 2.2 cm right renal calculus and right renal pain  with mild right-sided hydronephrosis.COVID-19 positive.  Patient has had episodes of depression over the years,patient started becoming paranoid at the beginning of the pandemic was very fearful, as she was a heavy smoker and this led to her being more isolated from people and even her own family.  Patient had significant decline past 2 months once he became increasingly paranoid worsening hallucinations declining oral intake becoming bedridden and decided she was unable to walk unable to swallow would not get up to go to the restroom using adult diapers.  She has a neurology evaluation on November 2022 w/ neg MRI.  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  Reluctant to take p.o. refusing PO med. Cont w/ IV fluids, PPI, nutrition supplement.  Did not tolerate/refused NG tube insertion yesterday at bedside, We will plan for core track placement today if she agrees, may try restraint and NG tube if family agreeable ( as per psych has no capacity for medical decision abt NG tube).  Discussed with patient's daughter- they want to proceed with cortrak tube while waiting for [facility] psych bed and can administer nutrition and medication will order small dose Ativan and bilateral wrist restraint.  We can probably discontinue core track tube prior to discharge to [facility].  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 And  planning for [facility] psych admission.Patient refusing to answer questions.patient is refusing further care  psychiatric  following. 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  Mild confusion-mild acute metabolic encephalopathy:multifactorial in the setting of multiple medical  comorbidities, dehydration, dysphagia,COVID-19 infection psych issues.  She is alert awake oriented x3 at this time. S/p high-dose thiamine.She has had extensive neuro work up including MRI brain, MR venogram CT venogram 04/24/21 as outpatient-no acute stroke, no dural venous sinus thrombosis. CT head in ED no acute finding, no obvious evidence of infection chest x-ray.UA leukocytes trace, WBC 6-10,culture-1000 colonies coccobacillus(not significant)  Right distal femoral vein age indeterminate nonocclusive DVT Chronic nonocclusive DVT left popliteal vein Elevated D-dimer, in the setting of COVID: Continue therapeutic Lovenox, refused VQ scan.Discussed with the daughter agreed for anticoagulation and will plan for Eliquis on discharge. Of note she does not have insurance.  AKI on CKDIIIb Dehydration  baseline creatinine 1.5, prerenal AKI 2/2 hypotension volume depletion from poor oral intake.  ultrasound with bilateral non-obstructive renal calculus and mild right-sided hydronephrosis.  AKI improved.    Continue IV fluid hydration,Hopefull she will allow NGT feeding Recent Labs  Lab 06/10/21 0800 06/11/21 0606 06/12/21 0324 06/15/21 0429  BUN 27* 20 16 12   CREATININE 1.24* 1.12* 1.13* 1.21*    Lactic acidosis, metabolic acidosis:due to hypotension dehydration. Resolved Hypernatremia: resolved  Hypercalcemia Secondary hyperparathyroidism Vitamin D deficiency: She has had recent work-up, calcium improved to 10> repeat stable, likely in the setting of volume epletion,Secondary to hyper parathyroidism from outpatient work-up but also had vitamin D deficiency, continue to replete.  Hypokalemia/hypophosphatemia/hypomagnesemia: Electrolytes are stable  Vague chest/abdomen pain:overnight EKG nonischemic changes, repeat troponin downtrending. Elevated troponin:Trop:96>101, ?demand ischemia due to tachycardia hypotension renal failure on presentation. TTE-EF 45-50%, no R WMA, G1 DD, normal RV  systolic function size and normal pulmonary artery systolic pressure.  COVID-19 infection, high risk of decompensation, unvaccinated, s/p Remdesivir x3 days currently not hypoxic no respiratory symptoms.CRP stable D-dimer elevated and downtrending.  Cont Lovenox for DVT.   Swelling of left upper arm:no DVT on duplex.keep it elevated.  Left uterine 3.9x2.9 cm fibroid:CT abd done 1/19- strongly favors fibroid although can be characterized with contrast enhanced MRI- will ask her to see PCP for that. No lymphadenoapthy.  Fecal impaction-added rectal bisacodyl, miralax daily  Urine retention-added flomax, in and out cath as needed and treat constpation   GOC: DNR.  Palliative care following.  Patient is refusing to eat and refusing to participate in the care difficult situation moving forward unclear if she will allow for NG tube.  Psychiatry following .  Social worker looking into [facility] psych facility  and once available we can discharge her.  DVT prophylaxis: lovenox therapeutic dose. Code Status:   Code Status: DNR  Disposition: Currently medically stable for discharge. Anticipated Disposition: For now patient will need geriatric psychiatric unit.  Total time spent in the care of this patient  35  MINUTES Objective: Vitals last 24 hrs: Vitals:   06/15/21 1622 06/15/21 1625 06/15/21 1924 06/16/21 0824  BP: 121/79 118/79 108/60 96/66  Pulse: (!) 116 (!) 113 (!) 120 (!) 113  Resp:   18 18  Temp:  97.7 F (36.5 C) 98 F (36.7 C) (!) 97.5 F (36.4 C)  TempSrc:   Axillary   SpO2: 99% 96% 96% 97%  Weight:      Height:       Weight change:   Intake/Output Summary (Last 24 hours) at 06/16/2021 1117 Last data filed at 06/16/2021 0420 Gross per 24 hour  Intake 1002.75 ml  Output 25 ml  Net 977.75 ml   Net IO Since Admission: 2,945.89 mL [06/16/21 1117]   Physical Examination: General exam: AAOx3, older than stated age, weak appearing. HEENT:Oral mucosa moist, Ear/Nose WNL grossly,  dentition normal. Respiratory system: bilaterally clear no use of accessory muscle Cardiovascular system: S1 & S2 +, No JVD,. Gastrointestinal system: Abdomen soft, NT,ND, BS+ Nervous System:Alert, awake, moving extremities and grossly nonfocal Extremities: no edema, distal peripheral pulses palpable.  Skin: No rashes,no icterus. MSK: Normal muscle bulk,tone, power   Medications reviewed:  Scheduled Meds:  enoxaparin (LOVENOX) injection  65 mg Subcutaneous Q12H   feeding supplement  237 mL Oral TID BM   haloperidol lactate  0.5 mg Intravenous QHS   mirtazapine  15 mg Per Tube QHS   multivitamin  15 mL Per Tube Daily   polyethylene glycol  17 g Per Tube Daily   sevelamer carbonate  800 mg Per Tube TID WC   sodium bicarbonate  650 mg Per Tube BID   tamsulosin  0.4 mg Oral QPC supper   thiamine  100 mg Per Tube TID   Vitamin D (Ergocalciferol)  50,000 Units Oral Q7 days   Continuous Infusions:  feeding supplement (OSMOLITE 1.2 CAL) 1,000 mL (06/16/21 1028)   Diet Order             DIET SOFT Room service appropriate? Yes; Fluid consistency: Thin  Diet effective now                 Weight change:   Wt Readings from Last 3 Encounters:  06/15/21 64.3 kg  04/25/21 71.2 kg  04/24/21 70.8 kg     Consultants:see note  Procedures:see note Antimicrobials: Anti-infectives (From admission, onward)    Start     Dose/Rate Route Frequency Ordered Stop   06/10/21 2000  vancomycin (VANCOCIN) IVPB 1000 mg/200 mL premix  Status:  Discontinued        1,000 mg 200 mL/hr over 60 Minutes Intravenous Every 36 hours 06/08/21 1834 06/08/21 1843   06/10/21 1000  remdesivir 100 mg in sodium chloride 0.9 % 100 mL IVPB       See Hyperspace for full Linked Orders Report.   100 mg 200 mL/hr over 30 Minutes Intravenous Daily 06/09/21 1010 06/11/21 1124   06/09/21 1130  remdesivir 200 mg in sodium chloride 0.9% 250 mL IVPB       See Hyperspace for full Linked Orders Report.   200 mg 580 mL/hr  over 30 Minutes Intravenous Once 06/09/21 1010 06/09/21 1224   06/09/21 0600  ceFEPIme (MAXIPIME) 2 g in sodium chloride 0.9 % 100 mL IVPB  Status:  Discontinued        2 g 200 mL/hr over 30 Minutes Intravenous Every 12 hours 06/08/21 1834 06/08/21 1843   06/08/21 1630  vancomycin (VANCOREADY) IVPB 1500 mg/300 mL  Status:  Discontinued        1,500 mg 150 mL/hr over 120 Minutes Intravenous  Once 06/08/21 1624 06/08/21 1843   06/08/21 1615  ceFEPIme (MAXIPIME) 2 g in sodium chloride 0.9 % 100 mL IVPB        2 g 200 mL/hr over 30 Minutes Intravenous  Once 06/08/21 1609 06/08/21 1819   06/08/21 1615  metroNIDAZOLE (FLAGYL) IVPB 500 mg        500 mg 100 mL/hr over 60 Minutes Intravenous  Once 06/08/21 1609 06/08/21 1841   06/08/21 1615  vancomycin (VANCOCIN) IVPB 1000 mg/200 mL premix  Status:  Discontinued  1,000 mg 200 mL/hr over 60 Minutes Intravenous  Once 06/08/21 1609 06/08/21 1624      Culture/Microbiology    Component Value Date/Time   SDES IN/OUT CATH URINE 06/09/2021 2318   SPECREQUEST NONE 06/09/2021 2318   CULT (A) 06/09/2021 2318    1,000 COLONIES/mL GRAM POSITIVE COCCOBACILLUS Standardized susceptibility testing for this organism is not available. Performed at [facility], [place] [address], [place], [place] [address]    REPTSTATUS 06/12/2021 FINAL 06/09/2021 2318    Other culture-see note  Unresulted Labs (From admission, onward)    None     Data Reviewed: I have personally reviewed following labs and imaging studies CBC: Recent Labs  Lab 06/10/21 0800 06/11/21 0606 06/12/21 0324 06/15/21 0429  WBC 5.3 4.4 4.9 7.0  HGB 10.9* 9.7* 10.3* 10.2*  HCT 33.5* 29.8* 31.5* 31.9*  MCV 89.8 92.8 92.4 93.0  PLT 111* PLATELET CLUMPS NOTED ON SMEAR, UNABLE TO ESTIMATE 93* 416*   Basic Metabolic Panel: Recent Labs  Lab 06/10/21 0800 06/10/21 1520 06/11/21 0606 06/12/21 0324 06/15/21 0429  NA 147*  --  139 143 143  K 2.5* 3.4* 4.2 4.1 5.0  CL 112*   --  117* 114* 116*  CO2 24  --  16* 19* 20*  GLUCOSE 67*  --  81 81 91  BUN 27*  --  20 16 12   CREATININE 1.24*  --  1.12* 1.13* 1.21*  CALCIUM 10.0  --  8.4* 9.4 9.5  MG 1.4*  --  2.0  --   --   PHOS 1.4*  --  2.6  --   --    GFR: Estimated Creatinine Clearance: 38.8 mL/min (A) (by C-G formula based on SCr of 1.21 mg/dL (H)). Liver Function Tests: Recent Labs  Lab 06/10/21 0800 06/11/21 0606 06/12/21 0324  AST 29 16 18   ALT 26 19 17   ALKPHOS 42 33* 37*  BILITOT 1.4* 1.0 0.7  PROT 5.5* 4.2* 4.2*  ALBUMIN 2.9* 2.2* 2.2*   No results for input(s): LIPASE, AMYLASE in the last 168 hours. No results for input(s): AMMONIA in the last 168 hours. Coagulation Profile: No results for input(s): INR, PROTIME in the last 168 hours.  Cardiac Enzymes: No results for input(s): CKTOTAL, CKMB, CKMBINDEX, TROPONINI in the last 168 hours.  BNP (last 3 results) No results for input(s): PROBNP in the last 8760 hours. HbA1C: No results for input(s): HGBA1C in the last 72 hours. CBG: Recent Labs  Lab 06/15/21 1612 06/15/21 2030 06/16/21 0001 06/16/21 0401 06/16/21 0821  GLUCAP 80 106* 114* 112* 129*   Lipid Profile: No results for input(s): CHOL, HDL, LDLCALC, TRIG, CHOLHDL, LDLDIRECT in the last 72 hours. Thyroid Function Tests: No results for input(s): TSH, T4TOTAL, FREET4, T3FREE, THYROIDAB in the last 72 hours.  Anemia Panel: No results for input(s): VITAMINB12, FOLATE, FERRITIN, TIBC, IRON, RETICCTPCT in the last 72 hours. Sepsis Labs: No results for input(s): PROCALCITON, LATICACIDVEN in the last 168 hours.   Recent Results (from the past 240 hour(s))  Resp Panel by RT-PCR (Flu A&B, Covid) Nasopharyngeal Swab     Status: Abnormal   Collection Time: 06/08/21  4:08 PM   Specimen: Nasopharyngeal Swab; Nasopharyngeal(NP) swabs in vial transport medium  Result Value Ref Range Status   SARS Coronavirus 2 by RT PCR POSITIVE (A) NEGATIVE Final    Comment: (NOTE) SARS-CoV-2  target nucleic acids are DETECTED.  The SARS-CoV-2 RNA is generally detectable in upper respiratory specimens during the acute phase of  infection. Positive results are indicative of the presence of the identified virus, but do not rule out bacterial infection or co-infection with other pathogens not detected by the test. Clinical correlation with patient history and other diagnostic information is necessary to determine patient infection status. The expected result is Negative.  Fact Sheet for Patients: EntrepreneurPulse.com.au  Fact Sheet for Healthcare Providers: IncredibleEmployment.be  This test is not yet approved or cleared by the [place] FDA and  has been authorized for detection and/or diagnosis of SARS-CoV-2 by FDA under an Emergency Use Authorization (EUA).  This EUA will remain in effect (meaning this test can be used) for the duration of  the COVID-19 declaration under Section 564(b)(1) of the A ct, 21 U.S.C. section 360bbb-3(b)(1), unless the authorization is terminated or revoked sooner.     Influenza A by PCR NEGATIVE NEGATIVE Final   Influenza B by PCR NEGATIVE NEGATIVE Final    Comment: (NOTE) The Xpert Xpress SARS-CoV-2/FLU/RSV plus assay is intended as an aid in the diagnosis of influenza from Nasopharyngeal swab specimens and should not be used as a sole basis for treatment. Nasal washings and aspirates are unacceptable for Xpert Xpress SARS-CoV-2/FLU/RSV testing.  Fact Sheet for Patients: EntrepreneurPulse.com.au  Fact Sheet for Healthcare Providers: IncredibleEmployment.be  This test is not yet approved or cleared by the [place] FDA and has been authorized for detection and/or diagnosis of SARS-CoV-2 by FDA under an Emergency Use Authorization (EUA). This EUA will remain in effect (meaning this test can be used) for the duration of the COVID-19 declaration under Section  564(b)(1) of the Act, 21 U.S.C. section 360bbb-3(b)(1), unless the authorization is terminated or revoked.  Performed at [facility], [place] [address], [place], [place] [address]   Blood culture (routine x 2)     Status: None   Collection Time: 06/08/21  4:45 PM   Specimen: BLOOD RIGHT HAND  Result Value Ref Range Status   Specimen Description BLOOD RIGHT HAND  Final   Special Requests   Final    BOTTLES DRAWN AEROBIC AND ANAEROBIC Blood Culture results may not be optimal due to an inadequate volume of blood received in culture bottles   Culture   Final    NO GROWTH 5 DAYS Performed at [facility], [place] [address], [place], [place] [address]    Report Status 06/13/2021 FINAL  Final  Blood culture (routine x 2)     Status: None   Collection Time: 06/08/21  8:37 PM   Specimen: BLOOD RIGHT HAND  Result Value Ref Range Status   Specimen Description BLOOD RIGHT HAND  Final   Special Requests   Final    BOTTLES DRAWN AEROBIC AND ANAEROBIC Blood Culture results may not be optimal due to an inadequate volume of blood received in culture bottles   Culture   Final    NO GROWTH 5 DAYS Performed at [facility], [place] [address] [place], [place] [address]    Report Status 06/13/2021 FINAL  Final  Urine Culture     Status: Abnormal   Collection Time: 06/09/21 11:18 PM   Specimen: In/Out Cath Urine  Result Value Ref Range Status   Specimen Description IN/OUT CATH URINE  Final   Special Requests NONE  Final   Culture (A)  Final    1,000 COLONIES/mL GRAM POSITIVE COCCOBACILLUS Standardized susceptibility testing for this organism is not available. Performed at [facility], [place] [address] [place], [place] [address]  Report Status 06/12/2021 FINAL  Final      Radiology Studies: DG Abd Portable 1V  Result Date: 06/15/2021 CLINICAL DATA:  Feeding tube placement. EXAM: PORTABLE ABDOMEN - 1 VIEW COMPARISON:  No comparison studies available. FINDINGS: 1449 hours.  The tip of the feeding tube is in the antro pyloric region of the stomach directed towards the duodenum. Visualized upper abdomen shows a nonspecific bowel gas pattern. IMPRESSION: Feeding tube tip is at the level of the pylorus, directed towards the duodenum. Electronically Signed   By: Misty Stanley M.D.   On: 06/15/2021 15:00     LOS: 8 days   Aiyden Lauderback A, MD [facility]  06/16/2021, 11:17 AM   Patient ID: Amber [name], female   DOB: 1945/12/09, 76 y.o.   MRN: [id number]  Patient suffers from a significant mental illness psychotic depression which is likely to result in morbidity and mortality if untreated renal failure from dehydration with electrolyte disturbances. There is evidence of significant and prolonged deterioration over the past 3 days .  She has been offered Haldol and mirtazapine and refused these medications due to psychosis and confusion.  She meets criteria for involuntary forced medication as outlined in [place] .1104. Dr. Lovette Cliche has also reviewed the case and is providing the second opinion for non-emergent forced medications (see their note from today).

## 2021-06-16 NOTE — Plan of Care (Signed)
  Problem: Education: Goal: Knowledge of General Education information will improve Description: Including pain rating scale, medication(s)/side effects and non-pharmacologic comfort measures Outcome: Not Progressing   Problem: Health Behavior/Discharge Planning: Goal: Ability to manage health-related needs will improve Outcome: Not Progressing   Problem: Clinical Measurements: Goal: Ability to maintain clinical measurements within normal limits will improve Outcome: Not Progressing Goal: Will remain free from infection Outcome: Not Progressing Goal: Diagnostic test results will improve Outcome: Not Progressing Goal: Respiratory complications will improve Outcome: Not Progressing Goal: Cardiovascular complication will be avoided Outcome: Not Progressing   Problem: Activity: Goal: Risk for activity intolerance will decrease Outcome: Not Progressing   Problem: Nutrition: Goal: Adequate nutrition will be maintained Outcome: Not Progressing   Problem: Coping: Goal: Level of anxiety will decrease Outcome: Not Progressing   Problem: Elimination: Goal: Will not experience complications related to bowel motility Outcome: Not Progressing Goal: Will not experience complications related to urinary retention Outcome: Not Progressing   Problem: Pain Managment: Goal: General experience of comfort will improve Outcome: Not Progressing   

## 2021-06-16 NOTE — Consult Note (Signed)
[facility] Face-to-Face Psychiatry Consult   Reason for Consult:  Depression Referring Physician:  Wynetta Fines, MD Patient Identification: Amber [name] MRN:  [id number] Principal Diagnosis: Major depressive disorder, recurrent episode, severe ([place]) Diagnosis:  Principal Problem:   Major depressive disorder, recurrent episode, severe ([place]) Active Problems:   Failure to thrive in adult   Hypernatremia   Dysphagia   COVID   MDD (major depressive disorder), single episode, severe with psychotic features ([place])   Total Time spent with patient: 20 minutes  Subjective:   Amber [name] is a 76 y.o. female patient admitted with failure to thrive.  HPI:  76 y.o. 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  Also found to be COVID positive.  Psych consult placed due to depression, SI.  Assessment 76 y.o. 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 She has been refusing medications, food and losing weight.  Does not understand the implications of refusing food and medications.  She is paranoid and doesn't trust people.  Patient refused barium swallow and VQ scan and now needs CT pelvis to rule out adnexal mass. Per NP's note daughter reported that patient had been having some psychotic symptoms, such as hallucinations, paranoia, answering yes and no by counting words in questions.  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  Started on low-dose Haldol from 1/18 which she has generally been refusing; has been consistently refusing NG tube. Starting NEFM protocol 06/16/20; converting mirtazapine to pill per pharmacy so it can be pushed through core trak and dropping dose in debilitated elderly pt w/ several days of nonadherence.   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 There is evidence of significant and prolonged deterioration over the past several weeks to couple of months. She has been offered haloperidol and mirtazapine  and refused these medications due to vague paranoid ideas about not wanting to take medication.  She meets criteria for involuntary forced medication as outlined in [place] .1104. Dr. Shanon Brow  has also reviewed the case and is providing the second opinion for non-emergent forced medications (see their note from today).     Recommendations: -Can switch to tele sitter for safety. -Patient does not have capacity to decide NG/ Core track tube placement. -If NG tube is placed, she may need restraints to avoid pulling NG/ core track tube.   NEFM protocol - Offer mirtazapine 7.5 mg; if refused crush and push through core track  - continue 0.5 mg IV haldol QHS which can be given over pt objection   - Optimized treatment for underline medical issues, rehydration and feeding(FTT) - Recommend Geriatric inpatient psychiatric admission after patient is medically cleared.  Thank you for this consult. Psychiatry service will continue to follow.    Subjective: Patient is seen and examined today.  Patient lying in her bed with daughter at bedside. She again affirms that she will die if she doesn't eat and that she does not want to die; persists in belief that she cannot swallow. Unable to provide reason she has refused barium swallow and eventually abandoned this line of questioning due to futility.  Reports poor appetite and sleep. Oriented but does poorly on attention testing (refused MOYB, DOWB, on 42-->27 cannot get past 40). Eventually states she doesn't want to talk anymore and I talked to daughter outside the room.   Talked to daughter  on  06/13/21- [employer]. Discussed r/b/se of mirtazapine and haloperidol in detail inc but not limited to risk of dystonia; described NEFM process and daughter's role as surrogate decision maker. Answered all questions as appropriate.   Past Psychiatric History: as above  Risk to Self:  yes, by refusing food, meds Risk to Others: No Prior Inpatient Therapy:  None in the past Prior Outpatient Therapy:  PCP  Past Medical History:  Past Medical History:  Diagnosis Date   Anxiety    Hypertension    No past surgical history on file. Family History:  Family History  Problem Relation Age of Onset   High blood pressure Maternal Grandmother    Family Psychiatric  History:  Social History:  Social History   Substance and Sexual Activity  Alcohol Use Not Currently     Social History   Substance and Sexual Activity  Drug Use Not Currently    Social History   Socioeconomic History   Marital status: Married    Spouse name: Not on file   Number of children: Not on file   Years of education: Not on file   Highest education level: Not on file  Occupational History   Not on file  Tobacco Use   Smoking status: Former    Types: Cigarettes   Smokeless tobacco: Never  Substance and Sexual Activity   Alcohol use: Not Currently   Drug use: Not Currently   Sexual activity: Not Currently  Other Topics Concern   Not on file  Social History Narrative   Right handed    one story home    lives with family    Doesn't work currently   Social Determinants of Radio broadcast assistant Strain: Not on file  Food Insecurity: Not on file  Transportation Needs: Not on file  Physical Activity: Not on file  Stress: Not on file  Social Connections: Not on file   Additional Social History:    Allergies:   Allergies  Allergen Reactions   Shellfish Allergy     "I pass out"    Labs:  Results for orders placed or performed during the hospital encounter of 06/08/21 (from the past 48  hour(s))  Glucose, capillary     Status: None   Collection Time: 06/15/21  4:00 AM  Result Value Ref Range   Glucose-Capillary 84 70 - 99 mg/dL    Comment: Glucose reference range applies only to samples taken after fasting for at least 8 hours.  Basic metabolic panel     Status: Abnormal   Collection Time: 06/15/21  4:29 AM  Result Value Ref Range   Sodium 143 135 - 145 mmol/L   Potassium 5.0 3.5 - 5.1 mmol/L   Chloride 116 (H) 98 - 111 mmol/L   CO2 20 (L) 22 - 32 mmol/L   Glucose, Bld 91 70 - 99 mg/dL    Comment: Glucose reference range applies only to samples taken after fasting for at least 8 hours.   BUN 12 8 - 23 mg/dL   Creatinine, Ser 1.21 (H) 0.44 - 1.00 mg/dL   Calcium 9.5 8.9 - 10.3 mg/dL   GFR, Estimated 47 (L) >60 mL/min    Comment: (NOTE) Calculated using the CKD-EPI Creatinine Equation (  2021)    Anion gap 7 5 - 15    Comment: Performed at [facility], [place] [address] [place], [place] [address]  CBC     Status: Abnormal   Collection Time: 06/15/21  4:29 AM  Result Value Ref Range   WBC 7.0 4.0 - 10.5 K/uL   RBC 3.43 (L) 3.87 - 5.11 MIL/uL   Hemoglobin 10.2 (L) 12.0 - 15.0 g/dL   HCT 31.9 (L) 36.0 - 46.0 %   MCV 93.0 80.0 - 100.0 fL   MCH 29.7 26.0 - 34.0 pg   MCHC 32.0 30.0 - 36.0 g/dL   RDW 14.8 11.5 - 15.5 %   Platelets 135 (L) 150 - 400 K/uL    Comment: REPEATED TO VERIFY   nRBC 0.0 0.0 - 0.2 %    Comment: Performed at [facility], [place] [address] [place], [place] [address]  Glucose, capillary     Status: None   Collection Time: 06/15/21  8:22 AM  Result Value Ref Range   Glucose-Capillary 84 70 - 99 mg/dL    Comment: Glucose reference range applies only to samples taken after fasting for at least 8 hours.  Glucose, capillary     Status: Abnormal   Collection Time: 06/15/21 11:56 AM  Result Value Ref Range   Glucose-Capillary 100 (H) 70 - 99 mg/dL    Comment: Glucose reference range applies only to samples taken after fasting for at least  8 hours.  Glucose, capillary     Status: None   Collection Time: 06/15/21  4:12 PM  Result Value Ref Range   Glucose-Capillary 80 70 - 99 mg/dL    Comment: Glucose reference range applies only to samples taken after fasting for at least 8 hours.  Glucose, capillary     Status: Abnormal   Collection Time: 06/15/21  8:30 PM  Result Value Ref Range   Glucose-Capillary 106 (H) 70 - 99 mg/dL    Comment: Glucose reference range applies only to samples taken after fasting for at least 8 hours.  Glucose, capillary     Status: Abnormal   Collection Time: 06/16/21 12:01 AM  Result Value Ref Range   Glucose-Capillary 114 (H) 70 - 99 mg/dL    Comment: Glucose reference range applies only to samples taken after fasting for at least 8 hours.  Glucose, capillary     Status: Abnormal   Collection Time: 06/16/21  4:01 AM  Result Value Ref Range   Glucose-Capillary 112 (H) 70 - 99 mg/dL    Comment: Glucose reference range applies only to samples taken after fasting for at least 8 hours.  Glucose, capillary     Status: Abnormal   Collection Time: 06/16/21  8:21 AM  Result Value Ref Range   Glucose-Capillary 129 (H) 70 - 99 mg/dL    Comment: Glucose reference range applies only to samples taken after fasting for at least 8 hours.  Glucose, capillary     Status: Abnormal   Collection Time: 06/16/21 11:38 AM  Result Value Ref Range   Glucose-Capillary 134 (H) 70 - 99 mg/dL    Comment: Glucose reference range applies only to samples taken after fasting for at least 8 hours.    Current Facility-Administered Medications  Medication Dose Route Frequency Provider Last Rate Last Admin   acetaminophen (TYLENOL) 160 MG/5ML solution 500 mg  500 mg Per Tube Q4H PRN Kc, Ramesh, MD       Or   acetaminophen (TYLENOL) suppository 650  mg  650 mg Rectal Q4H PRN Kc, Maren Beach, MD       bisacodyl (DULCOLAX) suppository 10 mg  10 mg Rectal Daily PRN Kc, Ramesh, MD       enoxaparin (LOVENOX) injection 65 mg  65 mg  Subcutaneous Q12H Kc, Ramesh, MD       feeding supplement (ENSURE ENLIVE / ENSURE PLUS) liquid 237 mL  237 mL Oral TID BM Kc, Ramesh, MD       feeding supplement (OSMOLITE 1.2 CAL) liquid 1,000 mL  1,000 mL Per Tube Continuous Kc, Maren Beach, MD 65 mL/hr at 06/16/21 1028 1,000 mL at 06/16/21 1028   haloperidol lactate (HALDOL) injection 0.5 mg  0.5 mg Intravenous QHS Vennela Jutte A   0.5 mg at 06/14/21 0000   mirtazapine (REMERON) tablet 7.5 mg  7.5 mg Oral QHS Krystl Wickware A       multivitamin liquid 15 mL  15 mL Per Tube Daily Kc, Ramesh, MD       ondansetron (ZOFRAN) tablet 4 mg  4 mg Oral Q6H PRN Wynetta Fines T, MD       Or   ondansetron [facility]) injection 4 mg  4 mg Intravenous Q6H PRN Wynetta Fines T, MD   4 mg at 06/08/21 2230   polyethylene glycol (MIRALAX / GLYCOLAX) packet 17 g  17 g Per Tube Daily Kc, Ramesh, MD       sevelamer carbonate (RENVELA) tablet 800 mg  800 mg Per Tube TID WC Kc, Ramesh, MD   800 mg at 06/15/21 1725   sodium bicarbonate tablet 650 mg  650 mg Per Tube BID Kc, Maren Beach, MD       tamsulosin (FLOMAX) capsule 0.4 mg  0.4 mg Oral QPC supper Kc, Ramesh, MD       thiamine tablet 100 mg  100 mg Per Tube TID Antonieta Pert, MD   100 mg at 06/15/21 2341   Vitamin D (Ergocalciferol) (DRISDOL) capsule 50,000 Units  50,000 Units Oral Q7 days Lequita Halt, MD        Musculoskeletal: Strength & Muscle Tone:  unable to assess, patient uncooperative Gait & Station: unsteady Patient leans: N/A     Psychiatric Specialty Exam:  Presentation  General Appearance: Appropriate for Environment (thin)  Eye Contact:Minimal  Speech:Clear and Coherent  Speech Volume:Decreased  Handedness:Right   Mood and Affect  Mood:Depressed  Affect:Congruent (despondent)   Thought Process  Thought Processes:Linear  Descriptions of Associations:Intact  Orientation:Full (Time, Place and Person)  Thought Content:-- (devoid of SI, HI, (+) delusions)  History of  Schizophrenia/Schizoaffective disorder:No  Duration of Psychotic Symptoms:Less than six months  Hallucinations:Hallucinations: None   Ideas of Reference:None  Suicidal Thoughts:Suicidal Thoughts: No   Homicidal Thoughts:Homicidal Thoughts: No    Sensorium  Memory:Immediate Fair; Recent Poor; Remote Fair  Judgment:Poor  Insight:Lacking   Executive Functions  Concentration:Poor  Attention Span:Poor  [place] of Knowledge:Poor  Language:Fair   Psychomotor Activity  Psychomotor Activity:Psychomotor Activity: Psychomotor Retardation    Assets  Assets:Social Support   Sleep  Sleep:Sleep: Fair    Physical Exam: Physical Exam Vitals reviewed.  Constitutional:      Appearance: She is not toxic-appearing.  HENT:     Head: Normocephalic.  Pulmonary:     Effort: Pulmonary effort is normal.  Neurological:     Mental Status: She is alert and oriented to person, place, and time.   Review of Systems  Psychiatric/Behavioral:  Positive for depression. Negative for hallucinations and suicidal ideas. The patient  is not nervous/anxious.   Blood pressure 106/76, pulse (!) 118, temperature 98.3 F (36.8 C), resp. rate 18, height 5\' 6"  (1.676 m), weight 64.3 kg, SpO2 100 %. Body mass index is 22.88 kg/m.  Disposition: Recommend psychiatric Inpatient admission when medically cleared. Supportive therapy provided about ongoing stressors. Psychiatric service will follow  Kerrie [place] Aarilyn Dye PGy2 06/16/2021 3:26 PM

## 2021-06-16 NOTE — Progress Notes (Signed)
°   06/16/21 0824  Assess: MEWS Score  Temp (!) 97.5 F (36.4 C)  BP 96/66  Pulse Rate (!) 113  Resp 18  SpO2 97 %  O2 Device Room Air  Assess: MEWS Score  MEWS Temp 0  MEWS Systolic 1  MEWS Pulse 2  MEWS RR 0  MEWS LOC 0  MEWS Score 3  MEWS Score Color Yellow  Assess: if the MEWS score is Yellow or Red  Were vital signs taken at a resting state? Yes  Focused Assessment No change from prior assessment  Early Detection of Sepsis Score *See Row Information* Low  MEWS guidelines implemented *See Row Information* No, previously yellow, continue vital signs every 4 hours  Take Vital Signs  Increase Vital Sign Frequency  Yellow: Q 2hr X 2 then Q 4hr X 2, if remains yellow, continue Q 4hrs  Escalate  MEWS: Escalate Yellow: discuss with charge nurse/RN and consider discussing with provider and RRT  Notify: Charge Nurse/RN  Name of Charge Nurse/RN Notified Albert RN  Date Charge Nurse/RN Notified 06/16/21  Time Charge Nurse/RN Notified [id number]  Notify: Provider  Provider Name/Title Dr. Shanon Brow  Date Provider Notified 06/16/21  Time Provider Notified [phone]  Notification Type Page  Notification Reason Other (Comment) (yellow MEWS)

## 2021-06-17 LAB — GLUCOSE, CAPILLARY
Glucose-Capillary: 129 mg/dL — ABNORMAL HIGH (ref 70–99)
Glucose-Capillary: 107 mg/dL — ABNORMAL HIGH (ref 70–99)
Glucose-Capillary: 102 mg/dL — ABNORMAL HIGH (ref 70–99)
Glucose-Capillary: 97 mg/dL (ref 70–99)
Glucose-Capillary: 125 mg/dL — ABNORMAL HIGH (ref 70–99)

## 2021-06-17 LAB — CBC WITH DIFFERENTIAL/PLATELET
Abs Immature Granulocytes: 0.04 10*3/uL (ref 0.00–0.07)
Basophils Absolute: 0 10*3/uL (ref 0.0–0.1)
Basophils Relative: 0 %
Eosinophils Absolute: 0.1 10*3/uL (ref 0.0–0.5)
Eosinophils Relative: 1 %
HCT: 26.2 % — ABNORMAL LOW (ref 36.0–46.0)
Hemoglobin: 8.5 g/dL — ABNORMAL LOW (ref 12.0–15.0)
Immature Granulocytes: 1 %
Lymphocytes Relative: 22 %
Lymphs Abs: 1.1 10*3/uL (ref 0.7–4.0)
MCH: 30.5 pg (ref 26.0–34.0)
MCHC: 32.4 g/dL (ref 30.0–36.0)
MCV: 93.9 fL (ref 80.0–100.0)
Monocytes Absolute: 0.4 10*3/uL (ref 0.1–1.0)
Monocytes Relative: 8 %
Neutro Abs: 3.4 10*3/uL (ref 1.7–7.7)
Neutrophils Relative %: 68 %
Platelets: 123 10*3/uL — ABNORMAL LOW (ref 150–400)
RBC: 2.79 MIL/uL — ABNORMAL LOW (ref 3.87–5.11)
RDW: 14.9 % (ref 11.5–15.5)
WBC: 5 10*3/uL (ref 4.0–10.5)
nRBC: 0 % (ref 0.0–0.2)

## 2021-06-17 LAB — BASIC METABOLIC PANEL
Anion gap: 4 — ABNORMAL LOW (ref 5–15)
BUN: 36 mg/dL — ABNORMAL HIGH (ref 8–23)
CO2: 21 mmol/L — ABNORMAL LOW (ref 22–32)
Calcium: 9.2 mg/dL (ref 8.9–10.3)
Chloride: 112 mmol/L — ABNORMAL HIGH (ref 98–111)
Creatinine, Ser: 1.2 mg/dL — ABNORMAL HIGH (ref 0.44–1.00)
GFR, Estimated: 48 mL/min — ABNORMAL LOW (ref >60)
Glucose, Bld: 110 mg/dL — ABNORMAL HIGH (ref 70–99)
Potassium: 4.9 mmol/L (ref 3.5–5.1)
Sodium: 137 mmol/L (ref 135–145)

## 2021-06-17 NOTE — Consult Note (Addendum)
Brief Psychiatry Consult Note  The patient was last seen by the psychiatry service on yesterday. Interim documentation by primary team and nursing staff has been reviewed. At this time, patient is continuing to generally refuse to eat or attend to personal hygiene. Forced med protocol started last night and pt received first dose of haldol/mirtazapine in several days. Seen in AM and was fairly sedated however was much more interactive with medicine team per report. She denied SI, HI and AH/VH. Did brief physical exam which was equivocal for any cogwheeling (questionably present but not replicable) and had no evidence of rigidity/dystonia/clonus. Will be seen by team tomorrow.   - psychiatry to continue to follow - no changes to meds  - forced meds expires 1/28 will likely renew 1/27 if still here to avoid leaving for weekend team.   I personally spent 25 minutes on the unit in direct patient care. The direct patient care time included face-to-face time with the patient, reviewing the patient's chart, communicating with other professionals, and coordinating care. Greater than 50% of this time was spent in counseling or coordinating care with the patient regarding goals of hospitalization, psycho-education, and discharge planning needs.   Amber [name] A Amber [name]  Addendum: deleted part of note template that erroneously stated we were signing of. Bolded part of note that said we would continue to follow.

## 2021-06-17 NOTE — Progress Notes (Signed)
Patient ID: Amber [name], female   DOB: [birth year], 76 y.o.   MRN: [id number]  PROGRESS NOTE    Amber [name]  ZJI:[id number] DOB: 07/16/1945 DOA: 06/08/2021 PCP: Lindell Spar, MD   Brief Narrative/Hospital Course: Amber [name], 76 y.o. 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  As per daughter patient started having swallowing problem for several weeks with dry heaves after eating solid food, episodes of confusion present hallucination and complaining of feeling something stuck in the throat when swallowing solid food and choked x2 and has refused to eat any solid food past week and mainly drinking water,  patient also complained "Has had enough and wanted to end her life". Husband had covid 3 wks ago and patient  had sore throat and stuffy nose that time.Her  symptoms onset from October 2022, and daughter reports that patient was seen by neurology and had work up with MRI brain, MR venogram CT venogram November 04/24/21-no acute stroke, no dural venous sinus thrombosis.  According to PCPs note, patient was worked up recently for elevated creatinine levels, renal ultrasound showed slight left-sided hydronephrosis.  Pelvis ultrasound showed mass, for which patient was referred to see outpatient OB/GYN.  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  And patient was started on vitamin D supplement however family reported patient has not been compliant with any of her pills.  PCP also tried Remeron and Ranexa, and patient has not been compliant either In the ED was hypotensive tachycardic labs with lactic acidosis 6.1, elevated creatinine 1.8 hypernatremia 149 hypercalcemia 11.6.  Was given 2 L bolus LR, blood pressure improved, also placed on broad-spectrum antibiotics.  CT  head chronic atrophy with white matter microvascular disease, chest x-ray no active disease Renal ultrasound 2.2 cm right renal calculus and right renal pain  with mild right-sided hydronephrosis.COVID-19 positive.  Patient has had episodes of depression over the years,patient started becoming paranoid at the beginning of the pandemic was very fearful, as she was [name] heavy smoker and this led to her being more isolated from people and even her own family.  Patient had significant decline past 2 months once he became increasingly paranoid worsening hallucinations declining oral intake becoming bedridden and decided she was unable to walk unable to swallow would not get up to go to the restroom using adult diapers.  She has [name] neurology evaluation on November 2022 w/ neg MRI.  Patient suffers from [name] significant mental illness psychotic depression which is likely to result in morbidity and mortality if untreated renal failure from dehydration with electrolyte disturbances. There is evidence of significant and prolonged deterioration over the past 3 days .  She has been offered Haldol and mirtazapine and refused these medications due to psychosis and confusion.  She meets criteria for involuntary forced medication as outlined in [place] .1104. Dr. Lovette Cliche has also reviewed the case and is providing the second opinion for non-emergent forced medications (see their note).    Subjective: Ms. Amber [name] seems to be improving and less agitated over the last 48 hours  Tolerating tube feeds    Assessment & Plan:  Failure to thrive with poor oral intake ?Dysphagia/choking sensation Psychotic depression refusing to eat: seen by speech, patient refused GI eval and DG esophagogram.  Reluctant to take p.o. refusing PO med.  Cont w/ IV fluids, PPI, nutrition supplement.  Did not tolerate/refused NG tube insertion yesterday at bedside, We will plan for core track placement today if she agrees, may try restraint and  NG tube if family agreeable ( as per psych has no capacity for medical decision abt NG tube).  Discussed with patient's daughter- they want to proceed with cortrak tube while waiting [facility] and can administer nutrition and medication will order small dose Ativan and bilateral wrist restraint.  We can probably discontinue core track tube prior to discharge to [facility].  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 And  planning for [facility] psych admission.Patient refusing to answer questions.patient is refusing further care  psychiatric  following. 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 Overall in the last 48 hours patient seems more interactive with answering questions and less agitated  Mild confusion-mild acute metabolic encephalopathy:multifactorial in the setting of multiple medical comorbidities, dehydration, dysphagia,COVID-19 infection psych issues.  She is alert awake oriented x3 at this time. S/p high-dose thiamine.She has had extensive neuro work up including MRI brain, MR venogram CT venogram 04/24/21 as outpatient-no acute stroke, no dural venous sinus thrombosis. CT head in ED no acute finding, no obvious evidence of infection chest x-ray.UA leukocytes trace, WBC 6-10,culture-1000 colonies coccobacillus(not significant)  Right distal femoral vein age indeterminate nonocclusive DVT Chronic nonocclusive DVT left popliteal vein Elevated D-dimer, in the setting of COVID: Continue therapeutic Lovenox, refused VQ scan.Discussed with the daughter agreed for anticoagulation and will plan for Eliquis on discharge. Of note she does not have insurance.  AKI on CKDIIIb Dehydration  baseline creatinine 1.5, prerenal AKI 2/2 hypotension volume depletion from poor oral intake.  ultrasound with bilateral non-obstructive renal calculus and mild right-sided hydronephrosis.  AKI resolved.  IV fluids stopped and tube feedings underway. Recent  Labs  Lab 06/11/21 0606 06/12/21 0324 06/15/21 0429 06/17/21 0753  BUN 20 16 12  36*  CREATININE 1.12* 1.13* 1.21* 1.20*    Lactic acidosis, metabolic acidosis:due to hypotension dehydration. Resolved Hypernatremia: resolved  Hypercalcemia Secondary hyperparathyroidism Vitamin D deficiency: She has had recent work-up, calcium improved to 10> repeat stable, likely in the setting of volume depletion,Secondary to hyper parathyroidism from outpatient work-up but also had vitamin D deficiency, continue to replete.  Hypokalemia/hypophosphatemia/hypomagnesemia: Electrolytes are stable  Vague chest/abdomen pain: EKG nonischemic changes, repeat troponin downtrending. Elevated troponin:Trop:96>101, ?demand ischemia due to tachycardia hypotension renal failure on presentation. TTE-EF 45-50%, no R WMA, G1 DD, normal RV systolic function size and normal pulmonary artery systolic pressure.  COVID-19 infection, high risk of decompensation, unvaccinated, s/p Remdesivir x3 days currently not hypoxic no respiratory symptoms.CRP stable D-dimer elevated and downtrending.  Cont Lovenox for DVT.   Swelling of left upper arm:no DVT on duplex.keep it elevated.  Left uterine 3.9x2.9 cm fibroid:CT abd done 1/19- strongly favors fibroid although can be characterized with contrast enhanced MRI- will ask her to see PCP for that. No lymphadenoapthy.  Fecal impaction-added rectal bisacodyl, miralax daily  Urine retention-added flomax, in and out cath as needed and treat constpation   GOC: DNR.  Palliative care following.  Patient is refusing to eat and refusing to participate in the care difficult situation moving forward unclear if she will allow for NG tube.  Psychiatry following .  Social worker looking into [facility] psych facility  and once available we can discharge her.  DVT prophylaxis: lovenox therapeutic dose. Code Status:   Code Status: DNR  Disposition:  Currently medically stable for  discharge. Anticipated Disposition: For now patient will need geriatric psychiatric unit.  Total time spent in the care of this patient  35  MINUTES Objective: Vitals last 24 hrs: Vitals:   06/16/21 2029 06/16/21 2030 06/17/21 0406 06/17/21 0728  BP: 109/81  103/67 (!) 95/58  Pulse: (!) 101  (!) 121 (!) 101  Resp: 17  16 16   Temp: 97.7 F (36.5 C)  99.3 F (37.4 C) 98.2 F (36.8 C)  TempSrc:   Oral Oral  SpO2: (!) 73% 100% 100% 99%  Weight:      Height:       Weight change:   Intake/Output Summary (Last 24 hours) at 06/17/2021 1054 Last data filed at 06/17/2021 4332 Gross per 24 hour  Intake 1817.58 ml  Output 1000 ml  Net 817.58 ml   Net IO Since Admission: 3,823.47 mL [06/17/21 1054]   Physical Examination: General exam: AAOx3, older than stated age, weak appearing.  More cooperative today and answering questions more clear HEENT:Oral mucosa moist, Ear/Nose WNL grossly, dentition normal. Respiratory system: bilaterally clear no use of accessory muscle Cardiovascular system: S1 & S2 +, No JVD,. Gastrointestinal system: Abdomen soft, NT,ND, BS+ Nervous System:Alert, awake, moving extremities and grossly nonfocal Extremities: no edema, distal peripheral pulses palpable.  Skin: No rashes,no icterus. MSK: Normal muscle bulk,tone, power   Medications reviewed:  Scheduled Meds:  enoxaparin (LOVENOX) injection  65 mg Subcutaneous Q12H   feeding supplement  237 mL Oral TID BM   haloperidol lactate  0.5 mg Intravenous QHS   mirtazapine  7.5 mg Per Tube QHS   multivitamin  15 mL Per Tube Daily   polyethylene glycol  17 g Per Tube Daily   sevelamer carbonate  800 mg Per Tube TID WC   sodium bicarbonate  650 mg Per Tube BID   tamsulosin  0.4 mg Oral QPC supper   thiamine  100 mg Per Tube TID   Vitamin D (Ergocalciferol)  50,000 Units Oral Q7 days   Continuous Infusions:  feeding supplement (OSMOLITE 1.2 CAL) 1,000 mL (06/16/21 1028)   Diet Order             DIET SOFT  Room service appropriate? Yes; Fluid consistency: Thin  Diet effective now                 Weight change:   Wt Readings from Last 3 Encounters:  06/15/21 64.3 kg  04/25/21 71.2 kg  04/24/21 70.8 kg     Consultants:see note  Procedures:see note Antimicrobials: Anti-infectives (From admission, onward)    Start     Dose/Rate Route Frequency Ordered Stop   06/10/21 2000  vancomycin (VANCOCIN) IVPB 1000 mg/200 mL premix  Status:  Discontinued        1,000 mg 200 mL/hr over 60 Minutes Intravenous Every 36 hours 06/08/21 1834 06/08/21 1843   06/10/21 1000  remdesivir 100 mg in sodium chloride 0.9 % 100 mL IVPB       See Hyperspace for full Linked Orders Report.   100 mg 200 mL/hr over 30 Minutes Intravenous Daily 06/09/21 1010 06/11/21 1124   06/09/21 1130  remdesivir 200 mg in sodium chloride 0.9% 250 mL IVPB       See Hyperspace for full Linked Orders Report.   200 mg 580 mL/hr over 30 Minutes Intravenous Once 06/09/21 1010 06/09/21 1224   06/09/21 0600  ceFEPIme (MAXIPIME) 2 g in sodium chloride 0.9 % 100 mL IVPB  Status:  Discontinued  2 g 200 mL/hr over 30 Minutes Intravenous Every 12 hours 06/08/21 1834 06/08/21 1843   06/08/21 1630  vancomycin (VANCOREADY) IVPB 1500 mg/300 mL  Status:  Discontinued        1,500 mg 150 mL/hr over 120 Minutes Intravenous  Once 06/08/21 1624 06/08/21 1843   06/08/21 1615  ceFEPIme (MAXIPIME) 2 g in sodium chloride 0.9 % 100 mL IVPB        2 g 200 mL/hr over 30 Minutes Intravenous  Once 06/08/21 1609 06/08/21 1819   06/08/21 1615  metroNIDAZOLE (FLAGYL) IVPB 500 mg        500 mg 100 mL/hr over 60 Minutes Intravenous  Once 06/08/21 1609 06/08/21 1841   06/08/21 1615  vancomycin (VANCOCIN) IVPB 1000 mg/200 mL premix  Status:  Discontinued        1,000 mg 200 mL/hr over 60 Minutes Intravenous  Once 06/08/21 1609 06/08/21 1624      Culture/Microbiology    Component Value Date/Time   SDES IN/OUT CATH URINE 06/09/2021 2318    SPECREQUEST NONE 06/09/2021 2318   CULT ([name]) 06/09/2021 2318    1,000 COLONIES/mL GRAM POSITIVE COCCOBACILLUS Standardized susceptibility testing for this organism is not available. Performed at [facility], [place] [address] [place], [place] [address]    REPTSTATUS 06/12/2021 FINAL 06/09/2021 2318    Other culture-see note  Unresulted Labs (From admission, onward)     Start     Ordered   06/18/21 6283  Basic metabolic panel  Daily,   R     Question:  Specimen collection method  Answer:  Lab=Lab collect   06/17/21 0724   06/18/21 0500  CBC with Differential/Platelet  Daily,   R     Question:  Specimen collection method  Answer:  Lab=Lab collect   06/17/21 0724          Data Reviewed: I have personally reviewed following labs and imaging studies CBC: Recent Labs  Lab 06/11/21 0606 06/12/21 0324 06/15/21 0429 06/17/21 0753  WBC 4.4 4.9 7.0 5.0  NEUTROABS  --   --   --  3.4  HGB 9.7* 10.3* 10.2* 8.5*  HCT 29.8* 31.5* 31.9* 26.2*  MCV 92.8 92.4 93.0 93.9  PLT PLATELET CLUMPS NOTED ON SMEAR, UNABLE TO ESTIMATE 93* 135* 662*   Basic Metabolic Panel: Recent Labs  Lab 06/10/21 1520 06/11/21 0606 06/12/21 0324 06/15/21 0429 06/17/21 0753  NA  --  139 143 143 137  K 3.4* 4.2 4.1 5.0 4.9  CL  --  117* 114* 116* 112*  CO2  --  16* 19* 20* 21*  GLUCOSE  --  81 81 91 110*  BUN  --  20 16 12  36*  CREATININE  --  1.12* 1.13* 1.21* 1.20*  CALCIUM  --  8.4* 9.4 9.5 9.2  MG  --  2.0  --   --   --   PHOS  --  2.6  --   --   --    GFR: Estimated Creatinine Clearance: 39.1 mL/min ([name]) (by C-G formula based on SCr of 1.2 mg/dL (H)). Liver Function Tests: Recent Labs  Lab 06/11/21 0606 06/12/21 0324  AST 16 18  ALT 19 17  ALKPHOS 33* 37*  BILITOT 1.0 0.7  PROT 4.2* 4.2*  ALBUMIN 2.2* 2.2*   No results for input(s): LIPASE, AMYLASE in the last 168 hours. No results for input(s): AMMONIA in the last 168 hours. Coagulation Profile: No results for input(s): INR,  PROTIME in  the last 168 hours.  Cardiac Enzymes: No results for input(s): CKTOTAL, CKMB, CKMBINDEX, TROPONINI in the last 168 hours.  BNP (last 3 results) No results for input(s): PROBNP in the last 8760 hours. HbA1C: No results for input(s): HGBA1C in the last 72 hours. CBG: Recent Labs  Lab 06/16/21 1658 06/16/21 1941 06/16/21 2332 06/17/21 0348 06/17/21 0723  GLUCAP 142* 136* 122* 129* 107*   Lipid Profile: No results for input(s): CHOL, HDL, LDLCALC, TRIG, CHOLHDL, LDLDIRECT in the last 72 hours. Thyroid Function Tests: No results for input(s): TSH, T4TOTAL, FREET4, T3FREE, THYROIDAB in the last 72 hours.  Anemia Panel: No results for input(s): VITAMINB12, FOLATE, FERRITIN, TIBC, IRON, RETICCTPCT in the last 72 hours. Sepsis Labs: No results for input(s): PROCALCITON, LATICACIDVEN in the last 168 hours.   Recent Results (from the past 240 hour(s))  Resp Panel by RT-PCR (Flu [name]&B, Covid) Nasopharyngeal Swab     Status: Abnormal   Collection Time: 06/08/21  4:08 PM   Specimen: Nasopharyngeal Swab; Nasopharyngeal(NP) swabs in vial transport medium  Result Value Ref Range Status   SARS Coronavirus 2 by RT PCR POSITIVE ([name]) NEGATIVE Final    Comment: (NOTE) SARS-CoV-2 target nucleic acids are DETECTED.  The SARS-CoV-2 RNA is generally detectable in upper respiratory specimens during the acute phase of infection. Positive results are indicative of the presence of the identified virus, but do not rule out bacterial infection or co-infection with other pathogens not detected by the test. Clinical correlation with patient history and other diagnostic information is necessary to determine patient infection status. The expected result is Negative.  Fact Sheet for Patients: EntrepreneurPulse.com.au  Fact Sheet for Healthcare Providers: IncredibleEmployment.be  This test is not yet approved or cleared by the [place] FDA and  has been  authorized for detection and/or diagnosis of SARS-CoV-2 by FDA under an Emergency Use Authorization (EUA).  This EUA will remain in effect (meaning this test can be used) for the duration of  the COVID-19 declaration under Section 564(b)(1) of the [name] ct, 21 U.S.C. section 360bbb-3(b)(1), unless the authorization is terminated or revoked sooner.     Influenza [name] by PCR NEGATIVE NEGATIVE Final   Influenza B by PCR NEGATIVE NEGATIVE Final    Comment: (NOTE) The Xpert Xpress SARS-CoV-2/FLU/RSV plus assay is intended as an aid in the diagnosis of influenza from Nasopharyngeal swab specimens and should not be used as [name] sole basis for treatment. Nasal washings and aspirates are unacceptable for Xpert Xpress SARS-CoV-2/FLU/RSV testing.  Fact Sheet for Patients: EntrepreneurPulse.com.au  Fact Sheet for Healthcare Providers: IncredibleEmployment.be  This test is not yet approved or cleared by the [place] FDA and has been authorized for detection and/or diagnosis of SARS-CoV-2 by FDA under an Emergency Use Authorization (EUA). This EUA will remain in effect (meaning this test can be used) for the duration of the COVID-19 declaration under Section 564(b)(1) of the Act, 21 U.S.C. section 360bbb-3(b)(1), unless the authorization is terminated or revoked.  Performed at [facility], [place] [address] [place], [place] [address]   Blood culture (routine x 2)     Status: None   Collection Time: 06/08/21  4:45 PM   Specimen: BLOOD RIGHT HAND  Result Value Ref Range Status   Specimen Description BLOOD RIGHT HAND  Final   Special Requests   Final    BOTTLES DRAWN AEROBIC AND ANAEROBIC Blood Culture results may not be optimal due to an inadequate volume of blood received in culture bottles  Culture   Final    NO GROWTH 5 DAYS Performed at [facility], [place] [address], [place], [place] [address]    Report Status 06/13/2021 FINAL  Final  Blood  culture (routine x 2)     Status: None   Collection Time: 06/08/21  8:37 PM   Specimen: BLOOD RIGHT HAND  Result Value Ref Range Status   Specimen Description BLOOD RIGHT HAND  Final   Special Requests   Final    BOTTLES DRAWN AEROBIC AND ANAEROBIC Blood Culture results may not be optimal due to an inadequate volume of blood received in culture bottles   Culture   Final    NO GROWTH 5 DAYS Performed at [facility], [place] [address], [place], [place] [address]    Report Status 06/13/2021 FINAL  Final  Urine Culture     Status: Abnormal   Collection Time: 06/09/21 11:18 PM   Specimen: In/Out Cath Urine  Result Value Ref Range Status   Specimen Description IN/OUT CATH URINE  Final   Special Requests NONE  Final   Culture ([name])  Final    1,000 COLONIES/mL GRAM POSITIVE COCCOBACILLUS Standardized susceptibility testing for this organism is not available. Performed at [facility], [place] [address], [place], [place] [address]    Report Status 06/12/2021 FINAL  Final      Radiology Studies: DG Abd Portable 1V  Result Date: 06/15/2021 CLINICAL DATA:  Feeding tube placement. EXAM: PORTABLE ABDOMEN - 1 VIEW COMPARISON:  No comparison studies available. FINDINGS: 1449 hours. The tip of the feeding tube is in the antro pyloric region of the stomach directed towards the duodenum. Visualized upper abdomen shows [name] nonspecific bowel gas pattern. IMPRESSION: Feeding tube tip is at the level of the pylorus, directed towards the duodenum. Electronically Signed   By: Misty Stanley M.D.   On: 06/15/2021 15:00     LOS: 9 days   Amber Jeanbaptiste A, MD [facility]  06/17/2021, 10:54 AM   Patient ID: Amber [name], female   DOB: 08-01-1945, 76 y.o.   MRN: [id number]

## 2021-06-18 LAB — BASIC METABOLIC PANEL
Anion gap: 7 (ref 5–15)
BUN: 34 mg/dL — ABNORMAL HIGH (ref 8–23)
CO2: 21 mmol/L — ABNORMAL LOW (ref 22–32)
Calcium: 9.1 mg/dL (ref 8.9–10.3)
Chloride: 107 mmol/L (ref 98–111)
Creatinine, Ser: 1.14 mg/dL — ABNORMAL HIGH (ref 0.44–1.00)
GFR, Estimated: 51 mL/min — ABNORMAL LOW (ref >60)
Glucose, Bld: 126 mg/dL — ABNORMAL HIGH (ref 70–99)
Potassium: 4.4 mmol/L (ref 3.5–5.1)
Sodium: 135 mmol/L (ref 135–145)

## 2021-06-18 LAB — CBC WITH DIFFERENTIAL/PLATELET
Abs Immature Granulocytes: 0.04 10*3/uL (ref 0.00–0.07)
Basophils Absolute: 0 10*3/uL (ref 0.0–0.1)
Basophils Relative: 1 %
Eosinophils Absolute: 0.1 10*3/uL (ref 0.0–0.5)
Eosinophils Relative: 2 %
HCT: 26 % — ABNORMAL LOW (ref 36.0–46.0)
Hemoglobin: 8.5 g/dL — ABNORMAL LOW (ref 12.0–15.0)
Immature Granulocytes: 1 %
Lymphocytes Relative: 27 %
Lymphs Abs: 1.3 10*3/uL (ref 0.7–4.0)
MCH: 30.2 pg (ref 26.0–34.0)
MCHC: 32.7 g/dL (ref 30.0–36.0)
MCV: 92.5 fL (ref 80.0–100.0)
Monocytes Absolute: 0.3 10*3/uL (ref 0.1–1.0)
Monocytes Relative: 6 %
Neutro Abs: 3.1 10*3/uL (ref 1.7–7.7)
Neutrophils Relative %: 63 %
Platelets: 133 10*3/uL — ABNORMAL LOW (ref 150–400)
RBC: 2.81 MIL/uL — ABNORMAL LOW (ref 3.87–5.11)
RDW: 14.6 % (ref 11.5–15.5)
WBC: 4.9 10*3/uL (ref 4.0–10.5)
nRBC: 0 % (ref 0.0–0.2)

## 2021-06-18 LAB — MAGNESIUM: Magnesium: 1.6 mg/dL — ABNORMAL LOW (ref 1.7–2.4)

## 2021-06-18 LAB — GLUCOSE, CAPILLARY
Glucose-Capillary: 104 mg/dL — ABNORMAL HIGH (ref 70–99)
Glucose-Capillary: 109 mg/dL — ABNORMAL HIGH (ref 70–99)
Glucose-Capillary: 113 mg/dL — ABNORMAL HIGH (ref 70–99)
Glucose-Capillary: 124 mg/dL — ABNORMAL HIGH (ref 70–99)
Glucose-Capillary: 125 mg/dL — ABNORMAL HIGH (ref 70–99)

## 2021-06-18 LAB — VITAMIN B12: Vitamin B-12: 135 pg/mL — ABNORMAL LOW (ref 180–914)

## 2021-06-18 LAB — AMMONIA: Ammonia: 13 umol/L (ref 9–35)

## 2021-06-18 LAB — PHOSPHORUS: Phosphorus: 2.2 mg/dL — ABNORMAL LOW (ref 2.5–4.6)

## 2021-06-18 MED ORDER — CYANOCOBALAMIN 1000 MCG/ML IJ SOLN: 1000.0000 ug | Freq: Once | INTRAMUSCULAR | Status: DC

## 2021-06-18 MED ORDER — SODIUM PHOSPHATES 45 MMOLE/15ML IV SOLN
15.0000 mmol | Freq: Once | INTRAVENOUS | Status: AC
  Administered 2021-06-18: 15 mmol via INTRAVENOUS
  Filled 2021-06-18: qty 5

## 2021-06-18 MED ORDER — ADULT MULTIVITAMIN W/MINERALS CH: 1.0000 | ORAL_TABLET | Freq: Every day | ORAL | Status: DC

## 2021-06-18 MED ORDER — FREE WATER
100.0000 mL | Status: DC
  Administered 2021-06-18 – 2021-06-19 (×7): 100 mL

## 2021-06-18 MED ORDER — THIAMINE HCL 100 MG PO TABS: 100.0000 mg | ORAL_TABLET | Freq: Every day | ORAL | Status: DC

## 2021-06-18 MED ORDER — CYANOCOBALAMIN 1000 MCG/ML IJ SOLN
1000.0000 ug | Freq: Every day | INTRAMUSCULAR | Status: DC
  Administered 2021-06-18 – 2021-06-19 (×2): 1000 ug via INTRAMUSCULAR
  Filled 2021-06-18 (×2): qty 1

## 2021-06-18 MED ORDER — SODIUM CHLORIDE 0.9 % IV SOLN
500.0000 mg | Freq: Two times a day (BID) | INTRAVENOUS | Status: DC
  Administered 2021-06-18 – 2021-06-19 (×3): 500 mg via INTRAVENOUS
  Filled 2021-06-18 (×4): qty 5

## 2021-06-18 MED ORDER — MAGNESIUM SULFATE 2 GM/50ML IV SOLN
2.0000 g | Freq: Once | INTRAVENOUS | Status: AC
  Administered 2021-06-18: 2 g via INTRAVENOUS
  Filled 2021-06-18: qty 50

## 2021-06-18 MED ORDER — ADULT MULTIVITAMIN LIQUID CH
15.0000 mL | Freq: Every day | ORAL | Status: DC
  Administered 2021-06-19: 15 mL via ORAL
  Filled 2021-06-18: qty 15

## 2021-06-18 NOTE — Progress Notes (Signed)
ANTICOAGULATION CONSULT NOTE- follow-up Pharmacy Consult for enoxaparin Indication: DVT  Allergies  Allergen Reactions   Shellfish Allergy     "I pass out"    Patient Measurements: Height: 5\' 6"  (167.6 cm) Weight: 64.1 kg (141 lb 5 oz) IBW/kg (Calculated) : 59.3  Vital Signs: Temp: 98.5 F (36.9 C) (01/23 0841) Temp Source: Axillary (01/23 0841) BP: 101/68 (01/23 0846) Pulse Rate: 103 (01/23 0846)  Labs: Recent Labs    06/17/21 0753 06/18/21 0218  HGB 8.5* 8.5*  HCT 26.2* 26.0*  PLT 123* 133*  CREATININE 1.20* 1.14*     Estimated Creatinine Clearance: 41.1 mL/min (A) (by C-G formula based on SCr of 1.14 mg/dL (H)).   Medical History: Past Medical History:  Diagnosis Date   Anxiety    Hypertension       Assessment: 45 yoF admitted encephalopathic with COVID. 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 Cr stable, H/H ok.  Goal of Therapy:  Monitor platelets by anticoagulation protocol: Yes   Plan:  Continue Enoxaparin to 65 mg sq BID Watch Cr and pltc closely- currently stable  Thank you Vaughan Basta BS, PharmD, BCPS Clinical Pharmacist 06/18/2021 9:19 AM

## 2021-06-18 NOTE — Progress Notes (Addendum)
Nutrition Follow-up  DOCUMENTATION CODES:   Severe malnutrition in context of chronic illness  INTERVENTION:  Continue TF via Cortrak: -Osmolite 1.2 @ 39m/hr (15645md) -1002mree water Q4H   At goal, TF would provide 1872 kcals, 86 grams protein, 1279m36mee water (1879ml66mal free water with flushes)  Concerned pt is refeeding. Monitor magnesium, potassium, and phosphorus BID for at least 3 days, MD/Pharmacy to replete as needed.  -recommend discontinue renvela given low phosphorus level -will discontinue oral Ensure Enlive due to pt refusing  -transition liquid MVI to MVI with minerals daily crushed per tube as liquid MVI can contribute to diarrhea  NUTRITION DIAGNOSIS:   Severe Malnutrition related to chronic illness (major depressive disorder) as evidenced by percent weight loss, severe muscle depletion, severe fat depletion. updated  GOAL:   Patient will meet greater than or equal to 90% of their needs Met with TF  MONITOR:   TF tolerance  REASON FOR ASSESSMENT:   Consult Enteral/tube feeding initiation and management  ASSESSMENT:   Pt with PMH significant for HTN, CKD stg IIIb, vitamin D deficiency 2/2 CKD, secondary hyperparathyroidism admitted with hypernatremia 2/2 FTT in the setting of depression/MDD.  1/20 - Cortrak placed (tip at antro pyloric region of the stomach directed towards the duodenum per xray)  RD discussed pt with Pharmacist as there is concern that pt is refeeding given abnormalities in electrolytes. RD reordered refeeding labs -- MD/Pharmacy to replete as appropriate.   Noted pt appearing much calmer/less agitated over last 48 hours. Tolerating TF via Cortrak well per RN. Current orders: Osmolite 1.2 @ 65ml/54mPlan to continue this plan of care.   Medications: vitamin B 12, Ensure TID, liquid MVI, remeron, renvela, sodium bicarbonate, thiamine, vitamin D, IV sodium phosphate 15mmol68mLabs: BUN 34 (H), Cr 1.14 (H), PO4 2.2 (L), Mg 1.6  (L) CBGs: 104-125 x 24 hours  Pt weighed 64.1 kg on 06/17/21 and 71.2 kg on 04/25/21. This indicates a clinically significant 9.97% wt loss x2 months.   UOP: 1100ml x236murs I/O: +4090ml sin14mdmit  NUTRITION - FOCUSED PHYSICAL EXAM: Flowsheet Row Most Recent Value  Orbital Region Severe depletion  Upper Arm Region Severe depletion  Thoracic and Lumbar Region Severe depletion  Buccal Region Severe depletion  Temple Region Severe depletion  Clavicle Bone Region Severe depletion  Clavicle and Acromion Bone Region Severe depletion  Scapular Bone Region Severe depletion  Dorsal Hand Severe depletion  Patellar Region Severe depletion  Anterior Thigh Region Severe depletion  Posterior Calf Region Severe depletion  Edema (RD Assessment) None  Hair Reviewed  Eyes Reviewed  Mouth Reviewed  Skin Reviewed  Nails Reviewed        Diet Order:   Diet Order             DIET SOFT Room service appropriate? Yes; Fluid consistency: Thin  Diet effective now                   EDUCATION NEEDS:   Not appropriate for education at this time  Skin:  Skin Assessment: Reviewed RN Assessment  Last BM:  1/22  Height:   Ht Readings from Last 1 Encounters:  06/09/21 _0  (1.676 m)    Weight:   Wt Readings from Last 1 Encounters:  06/17/21 64.1 kg    BMI:  Body mass index is 22.81 kg/m.  Estimated Nutritional Needs:   Kcal:  1750-1950  Protein:  85-100 grams  Fluid:  >1.75L  Theone Stanley., MS, RD, LDN (she/her/hers) RD pager number and weekend/on-call pager number located in [place].

## 2021-06-18 NOTE — Progress Notes (Signed)
Patient is note on suicide precautions per MD, Psychiatry notes and charge nurse on Unit. Patient was ordered tele sitter and camera not available yet. 1-1 sitter remains in place at this time.  Latonya Knight, Tivis Ringer, RN

## 2021-06-18 NOTE — TOC Progression Note (Addendum)
Transition of Care [facility]) - Initial/Assessment Note    Patient Details  Name: Amber [name] MRN: [id number] Date of Birth: 12-Apr-1946  Transition of Care [facility]) CM/SW Contact:    Milinda Antis, LCSWA Phone Number: 06/18/2021, 10:42 AM  Clinical Narrative:                 CSW reviewed patient's chart.  Patient continues to have Surveyor, quantity.  CSW messaged MD requesting update on the discharge plan for the patient and is awaiting a response.    14:00-  CSW received update from MD that the family needs to review patient's living will to verify if the patient would want a PEG tube.    CSW contacted [facility] to inquire about their ability to accept a patient with a cortrak or Peg tube and is awaiting a returned call from the RN on that unit.  14:53-  CSW received a returned call from Vinnie Level, [place] with the geri psych unit at [facility]. She will speak with director to inquire about whether the patient will be appropriate for their facility.    15:06-  CSW informed that [facility] does not have any geri psych beds at this time.   Patient Goals and CMS Choice        Expected Discharge Plan and Services                                                Prior Living Arrangements/Services                       Activities of Daily Living      Permission Sought/Granted                  Emotional Assessment              Admission diagnosis:  Hypernatremia [E87.0] Failure to thrive in adult [R62.7] AKI (acute kidney injury) ([place]) [N17.9] Sepsis, due to unspecified organism, unspecified whether acute organ dysfunction present [facility]) [A41.9] Patient Active Problem List   Diagnosis Date Noted   MDD (major depressive disorder), single episode, severe with psychotic features ([place])    Dysphagia    COVID    Major depressive disorder, recurrent episode, severe ([place]) 06/09/2021   Failure to thrive in adult 06/08/2021   Hypernatremia 06/08/2021    Primary hyperparathyroidism ([place]) 06/07/2021   Hyperthyroidism 04/25/2021   Encounter for examination following treatment at hospital 04/25/2021   Gastroesophageal reflux disease 04/25/2021   Gait abnormality 03/29/2021   Urinary incontinence 03/29/2021   Moderate protein-calorie malnutrition ([place]) 03/29/2021   Depression with anxiety 03/29/2021   Aortic atherosclerosis ([place]) 03/29/2021   Adnexal mass 03/29/2021   PCP:  Lindell Spar, MD Pharmacy:   [facility], [place] [address] Phone: [phone] Fax: [phone]     Social Determinants of Health (SDOH) Interventions    Readmission Risk Interventions No flowsheet data found.

## 2021-06-18 NOTE — Progress Notes (Signed)
Physical Therapy Treatment Patient Details Name: Amber [name] MRN: [id number] DOB: 12-01-1945 Today's Date: 06/18/2021   History of Present Illness Amber [name] is a 76 y.o. female, came with failure to thrive, decr  oral intake; with medical history significant of HTN, CKD stage IIIb, vitamin D deficiency secondary to CKD, secondary hyperparathyroidism    PT Comments    Patient participated with insistence and encouragement today.  She sat on EOB and performed LE exercises.  She was only sitting possibly 1.5 minutes with much encouragement to continue each exercise.  She was supine for UE therex.  She was engaged and continued to ask if she could rest back in supine after she returned to supine.  She demonstrated no distress nor complaints of symptoms, but was very eager to return to supine.  Feel she will need SNF level rehab at d/c.  PT to continue to follow.   Recommendations for follow up therapy are one component of a multi-disciplinary discharge planning process, led by the attending physician.  Recommendations may be updated based on patient status, additional functional criteria and insurance authorization.  Follow Up Recommendations  Skilled nursing-short term rehab (<3 hours/day)     Assistance Recommended at Discharge Frequent or constant Supervision/Assistance  Patient can return home with the following Two people to help with walking and/or transfers;A lot of help with bathing/dressing/bathroom;Help with stairs or ramp for entrance   Equipment Recommendations  Rolling walker (2 wheels);Hospital bed;Wheelchair cushion (measurements PT)    Recommendations for Other Services       Precautions / Restrictions Precautions Precautions: Fall Precaution Comments: Covid Prec     Mobility  Bed Mobility Overal bed mobility: Needs Assistance Bed Mobility: Supine to Sit, Sit to Supine     Supine to sit: Min assist, Mod assist Sit to supine: Supervision   General bed  mobility comments: initiated legs off EOB to encourage upright as pt refusing despite much encouragement, able to sit her trunk upright and to supine unaided due to eager to return to supine    Transfers                   General transfer comment: refused up OOB    Ambulation/Gait                   Stairs             Wheelchair Mobility    Modified Rankin (Stroke Patients Only)       Balance Overall balance assessment: Needs assistance Sitting-balance support: Feet unsupported Sitting balance-Leahy Scale: Fair Sitting balance - Comments: static balance with S; when performing LE therex min A for balance                                    Cognition Arousal/Alertness: Awake/alert Behavior During Therapy: Flat affect Overall Cognitive Status: No family/caregiver present to determine baseline cognitive functioning                                          Exercises General Exercises - Upper Extremity Shoulder Flexion: Both, 5 reps, Supine, Strengthening (punching up toward ceiling holding apple sauce in each hand) Shoulder ADduction: Strengthening, Both, 5 reps, Supine (reaching across body to touch opposite bed rail) Elbow Flexion: Strengthening, Both, 5 reps, Supine (bicep  curls with apple sauce in each hand) General Exercises - Lower Extremity Ankle Circles/Pumps: AROM, Both, 10 reps, Seated Long Arc Quad: AROM, Both, 5 reps, Seated Hip Flexion/Marching: AROM, Both, 5 reps, Seated    General Comments General comments (skin integrity, edema, etc.): VSS though orthostatics not checked      Pertinent Vitals/Pain Pain Assessment Pain Assessment: No/denies pain    Home Living                          Prior Function            PT Goals (current goals can now be found in the care plan section) Progress towards PT goals: Progressing toward goals (slowly)    Frequency    Min 2X/week      PT  Plan Current plan remains appropriate    Co-evaluation              AM-PAC PT "6 Clicks" Mobility   Outcome Measure  Help needed turning from your back to your side while in a flat bed without using bedrails?: A Lot Help needed moving from lying on your back to sitting on the side of a flat bed without using bedrails?: A Lot Help needed moving to and from a bed to a chair (including a wheelchair)?: Total Help needed standing up from a chair using your arms (e.g., wheelchair or bedside chair)?: Total Help needed to walk in hospital room?: Total Help needed climbing 3-5 steps with a railing? : Total 6 Click Score: 8    End of Session   Activity Tolerance: Other (comment) (pt self limited) Patient left: in bed;with call bell/phone within reach;with bed alarm set   PT Visit Diagnosis: Other abnormalities of gait and mobility (R26.89);Muscle weakness (generalized) (M62.81);Adult, failure to thrive (R62.7)     Time: 1535-1550 PT Time Calculation (min) (ACUTE ONLY): 15 min  Charges:  $Therapeutic Exercise: 8-22 mins                     Amber [name], PT Acute Rehabilitation Services Pager:[phone] Office:[phone] 06/18/2021    Reginia Naas 06/18/2021, 4:29 PM

## 2021-06-18 NOTE — Progress Notes (Signed)
PROGRESS NOTE    Amber [name]  VHQ:[id number] DOB: May 30, 1945 DOA: 06/08/2021 PCP: Lindell Spar, MD   Brief Narrative: 76 year old with PMH of HTN CKD stage III b, vitamin D, secondary hyperparathyroidism who presented with failure to thrive.  Per daughter report, patient started having a swallowing problem for several weeks with dry heaves after eating solid food, episode of confusion, hallucination, complaining of feeling something stuck in her throat.  She refused to eat solid food.  Patient also reporter she " has had enough  and wanted to end her life".  Husband had COVID 3 weeks ago, patient also had some sore throat and stuffy nose.  Of note October 2022 patient had neurology follow-up, she had MRI brain and MR venogram CT venogram negative for acute stroke, no dural venous sinus thrombosis.  Evaluation by PCP as an outpatient pelvis ultrasound showed a mass for which she was referred for outpatient OB gynecology. 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  Severe vitamin D deficiency.  In the ED patient was noted to be hypotensive, tachycardic, lactic acidosis lactic acid of 6.1, creatinine 1.8, hypernatremia, hypercalcemia.  She received IV fluids.  CT head chronic atrophy with white matter microvascular disease.  Chest x-ray no active disease.  Renal ultrasound 2.2 cm right renal calculus and mild right side hydronephrosis.  COVID-19 positive.  Patient has had episode of depression over the years.  Over the last 2 months she become increasingly paranoid, having hallucination decreased oral intake.  She was evaluated by neurology outpatient and MRI was negative.  Patient evaluated by psych, patient felt to have depression with psychotic features.  Core track tube was placed.  She has been getting her medications through the PEG tube.   Assessment & Plan:   Principal Problem:   Major depressive  disorder, recurrent episode, severe (HCC) Active Problems:   Failure to thrive in adult   Hypernatremia   Dysphagia   COVID   MDD (major depressive disorder), single episode, severe with psychotic features ([place])   1-Failure to thrive with poor oral intake ?  Dysphagia/choking sensation Psychotic depression refusing to eat: -Patient refused GI evaluation and EGD esophagogram.  She was refusing to eat. -Per psych patient lack capacity for medical decision, in regards NG tube placement. -Patient currently has core track, continue with tube feedings. -Medications to be administered through core track. -Needs inpatient Geri psych unit -Psych consulted again, any other medications recommendation for depression.  Prognosis in regards psych condition to help  determine if patient will need PEG tube. -We will also consult palliative care, to help family with decision in regards to PEG tube placement, review patient's living will./  -on haldol IV and remeron.   2-Mild confusion, mild acute metabolic encephalopathy In the setting of multiple medical comorbidities, dehydration, dysphagia, COVID-19 infection, psych issues. MRI/CT venogram done as an outpatient 03/2021 negative for stroke. T head performed this admission no acute findings. B12 low, start B12 injections Ammonia level normal. Will check cortisol level in a.m.  3-Right distal femoral vein age-indeterminate nonocclusive DVT Chronic nonocclusive DVT left popliteal vein Elevated D-dimer in the setting of DVT and COVID -Currently on Lovenox. -Plan to transition on Eliquis on discharge.   4-AKI on CKD 3B Dehydration;  Baseline creatinine 1.5 Prerenal AKI secondary to hypotension, volume depletion, poor oral intake Renal ultrasound with bilateral nonobstructive renal calculus and mild right-sided hydronephrosis AKI resolved.  Acidosis, metabolic  acidosis: Due to hypotension, dehydration.  Resolved Hypernatremia: resolved.    Hypercalcemia Secondary hyperparathyroidism Vitamin D deficiency: She received IV fluids.  She was a started on vitamin D supplement  Hypokalemia/hypophosphatemia/hypomagnesemia: Replete with IV magnesium and phosphorus today.  Mild elevation of troponin demand ischemia: Resolved Echo ejection fraction 45 to 50% no regional wall motion abnormality.  COVID-19 infection: Received 3 days of attempt severe  Left uterine 3.9 x 2.9 cm fibroid: She will need to follow-up by gynecologist.  Fecal impaction: Resolved having multiple bowel movement, holding MiraLAX.  Urine retention: She was a started on Flomax.  In and out as need. If continue might need foley       Nutrition Problem: Inadequate oral intake Etiology: chronic illness (major depressive disorder)    Signs/Symptoms: per patient/family report, meal completion < 25%    Interventions: Refer to RD note for recommendations  Estimated body mass index is 22.81 kg/m as calculated from the following:   Height as of this encounter: 5\' 6"  (1.676 m).   Weight as of this encounter: 64.1 kg.   DVT prophylaxis: Lovenox.  Code Status: DNR Family Communication:Daughter updated.  Disposition Plan:  Status is: Inpatient  Remains inpatient appropriate because: requiring nutrition through  tube feeding, consulted again for medication management, prognosis, And if patient will need PEG tube.  Palliative care also reconsulted to discuss living will and PEG tube with family.         Consultants:  Psych   Procedures:    Antimicrobials:    Subjective: Patient was sleeping initially on my assessment, she subsequently answer questions, she kept her eyes closed.  She said she wanted to be left alone.  She wants  to sleep.  Objective: Vitals:   06/18/21 0003 06/18/21 0427 06/18/21 0841 06/18/21 0846  BP: 125/73 107/63  101/68  Pulse: (!) 121 (!) 107 (!) 109 (!) 103  Resp: 18 17 18 18   Temp: 98.2 F (36.8 C) 98.2 F  (36.8 C) 98.5 F (36.9 C)   TempSrc:   Axillary   SpO2: 100% 99% 97% 97%  Weight:      Height:        Intake/Output Summary (Last 24 hours) at 06/18/2021 1357 Last data filed at 06/18/2021 0725 Gross per 24 hour  Intake 1367.17 ml  Output 1100 ml  Net 267.17 ml   Filed Weights   06/09/21 0601 06/15/21 0410 06/17/21 0728  Weight: 71.4 kg 64.3 kg 64.1 kg    Examination:  General exam: Appears calm and comfortable  Respiratory system: Clear to auscultation. Respiratory effort normal. Cardiovascular system: S1 & S2 heard, RRR. No JVD, murmurs, rubs, gallops or clicks. No pedal edema. Gastrointestinal system: Abdomen is nondistended, soft and nontender. No organomegaly or masses felt. Normal bowel sounds heard. Central nervous system: Alert  Extremities: Symmetric 5 x 5 power.   Data Reviewed: I have personally reviewed following labs and imaging studies  CBC: Recent Labs  Lab 06/12/21 0324 06/15/21 0429 06/17/21 0753 06/18/21 0218  WBC 4.9 7.0 5.0 4.9  NEUTROABS  --   --  3.4 3.1  HGB 10.3* 10.2* 8.5* 8.5*  HCT 31.5* 31.9* 26.2* 26.0*  MCV 92.4 93.0 93.9 92.5  PLT 93* 135* 123* 341*   Basic Metabolic Panel: Recent Labs  Lab 06/12/21 0324 06/15/21 0429 06/17/21 0753 06/18/21 0218  NA 143 143 137 135  K 4.1 5.0 4.9 4.4  CL 114* 116* 112* 107  CO2 19* 20* 21* 21*  GLUCOSE 81 91 110*  126*  BUN 16 12 36* 34*  CREATININE 1.13* 1.21* 1.20* 1.14*  CALCIUM 9.4 9.5 9.2 9.1  MG  --   --   --  1.6*  PHOS  --   --   --  2.2*   GFR: Estimated Creatinine Clearance: 41.1 mL/min (A) (by C-G formula based on SCr of 1.14 mg/dL (H)). Liver Function Tests: Recent Labs  Lab 06/12/21 0324  AST 18  ALT 17  ALKPHOS 37*  BILITOT 0.7  PROT 4.2*  ALBUMIN 2.2*   No results for input(s): LIPASE, AMYLASE in the last 168 hours. Recent Labs  Lab 06/18/21 0835  AMMONIA 13   Coagulation Profile: No results for input(s): INR, PROTIME in the last 168 hours. Cardiac  Enzymes: No results for input(s): CKTOTAL, CKMB, CKMBINDEX, TROPONINI in the last 168 hours. BNP (last 3 results) No results for input(s): PROBNP in the last 8760 hours. HbA1C: No results for input(s): HGBA1C in the last 72 hours. CBG: Recent Labs  Lab 06/17/21 2138 06/18/21 0002 06/18/21 0427 06/18/21 0818 06/18/21 1156  GLUCAP 125* 125* 124* 104* 113*   Lipid Profile: No results for input(s): CHOL, HDL, LDLCALC, TRIG, CHOLHDL, LDLDIRECT in the last 72 hours. Thyroid Function Tests: No results for input(s): TSH, T4TOTAL, FREET4, T3FREE, THYROIDAB in the last 72 hours. Anemia Panel: Recent Labs    06/18/21 0835  VITAMINB12 135*   Sepsis Labs: No results for input(s): PROCALCITON, LATICACIDVEN in the last 168 hours.  Recent Results (from the past 240 hour(s))  Resp Panel by RT-PCR (Flu A&B, Covid) Nasopharyngeal Swab     Status: Abnormal   Collection Time: 06/08/21  4:08 PM   Specimen: Nasopharyngeal Swab; Nasopharyngeal(NP) swabs in vial transport medium  Result Value Ref Range Status   SARS Coronavirus 2 by RT PCR POSITIVE (A) NEGATIVE Final    Comment: (NOTE) SARS-CoV-2 target nucleic acids are DETECTED.  The SARS-CoV-2 RNA is generally detectable in upper respiratory specimens during the acute phase of infection. Positive results are indicative of the presence of the identified virus, but do not rule out bacterial infection or co-infection with other pathogens not detected by the test. Clinical correlation with patient history and other diagnostic information is necessary to determine patient infection status. The expected result is Negative.  Fact Sheet for Patients: EntrepreneurPulse.com.au  Fact Sheet for Healthcare Providers: IncredibleEmployment.be  This test is not yet approved or cleared by the [place] FDA and  has been authorized for detection and/or diagnosis of SARS-CoV-2 by FDA under an Emergency Use  Authorization (EUA).  This EUA will remain in effect (meaning this test can be used) for the duration of  the COVID-19 declaration under Section 564(b)(1) of the A ct, 21 U.S.C. section 360bbb-3(b)(1), unless the authorization is terminated or revoked sooner.     Influenza A by PCR NEGATIVE NEGATIVE Final   Influenza B by PCR NEGATIVE NEGATIVE Final    Comment: (NOTE) The Xpert Xpress SARS-CoV-2/FLU/RSV plus assay is intended as an aid in the diagnosis of influenza from Nasopharyngeal swab specimens and should not be used as a sole basis for treatment. Nasal washings and aspirates are unacceptable for Xpert Xpress SARS-CoV-2/FLU/RSV testing.  Fact Sheet for Patients: EntrepreneurPulse.com.au  Fact Sheet for Healthcare Providers: IncredibleEmployment.be  This test is not yet approved or cleared by the [place] FDA and has been authorized for detection and/or diagnosis of SARS-CoV-2 by FDA under an Emergency Use Authorization (EUA). This EUA will remain in effect (meaning this test can be  used) for the duration of the COVID-19 declaration under Section 564(b)(1) of the Act, 21 U.S.C. section 360bbb-3(b)(1), unless the authorization is terminated or revoked.  Performed at [facility], [place] [address] [place], [place] [address]   Blood culture (routine x 2)     Status: None   Collection Time: 06/08/21  4:45 PM   Specimen: BLOOD RIGHT HAND  Result Value Ref Range Status   Specimen Description BLOOD RIGHT HAND  Final   Special Requests   Final    BOTTLES DRAWN AEROBIC AND ANAEROBIC Blood Culture results may not be optimal due to an inadequate volume of blood received in culture bottles   Culture   Final    NO GROWTH 5 DAYS Performed at [facility], [place] [address] [place], [place] [address]    Report Status 06/13/2021 FINAL  Final  Blood culture (routine x 2)     Status: None   Collection Time: 06/08/21  8:37 PM    Specimen: BLOOD RIGHT HAND  Result Value Ref Range Status   Specimen Description BLOOD RIGHT HAND  Final   Special Requests   Final    BOTTLES DRAWN AEROBIC AND ANAEROBIC Blood Culture results may not be optimal due to an inadequate volume of blood received in culture bottles   Culture   Final    NO GROWTH 5 DAYS Performed at [facility], [place] [address], [place], [place] [address]    Report Status 06/13/2021 FINAL  Final  Urine Culture     Status: Abnormal   Collection Time: 06/09/21 11:18 PM   Specimen: In/Out Cath Urine  Result Value Ref Range Status   Specimen Description IN/OUT CATH URINE  Final   Special Requests NONE  Final   Culture (A)  Final    1,000 COLONIES/mL GRAM POSITIVE COCCOBACILLUS Standardized susceptibility testing for this organism is not available. Performed at [facility], [place] [address], [place], [place] [address]    Report Status 06/12/2021 FINAL  Final         Radiology Studies: No results found.      Scheduled Meds:  cyanocobalamin  1,000 mcg Intramuscular Daily   enoxaparin (LOVENOX) injection  65 mg Subcutaneous Q12H   feeding supplement  237 mL Oral TID BM   haloperidol lactate  0.5 mg Intravenous QHS   mirtazapine  7.5 mg Per Tube QHS   multivitamin  15 mL Per Tube Daily   sevelamer carbonate  800 mg Per Tube TID WC   sodium bicarbonate  650 mg Per Tube BID   tamsulosin  0.4 mg Oral QPC supper   [START ON 06/21/2021] thiamine  100 mg Oral Daily   Vitamin D (Ergocalciferol)  50,000 Units Oral Q7 days   Continuous Infusions:  feeding supplement (OSMOLITE 1.2 CAL) 1,000 mL (06/18/21 0522)   thiamine injection       LOS: 10 days    Time spent: 35 minutes.     Elmarie Shiley, MD [facility]   If 7PM-7AM, please contact night-coverage [email or web address]  06/18/2021, 1:57 PM

## 2021-06-19 ENCOUNTER — Inpatient Hospital Stay
Admission: AD | Admit: 2021-06-19 | Discharge: 2021-07-17 | DRG: 871 | Disposition: A | Discharge status: Skilled Nursing Facility | Payer: Medicare Other | Source: Other Acute Inpatient Hospital | Attending: Internal Medicine | Admitting: Internal Medicine

## 2021-06-19 ENCOUNTER — Encounter: Payer: Self-pay | Admitting: Internal Medicine

## 2021-06-19 ENCOUNTER — Other Ambulatory Visit: Payer: Self-pay

## 2021-06-19 DIAGNOSIS — Y846 Urinary catheterization as the cause of abnormal reaction of the patient, or of later complication, without mention of misadventure at the time of the procedure: Secondary | ICD-10-CM | POA: Diagnosis present

## 2021-06-19 DIAGNOSIS — T8383XA Hemorrhage of genitourinary prosthetic devices, implants and grafts, initial encounter: Secondary | ICD-10-CM | POA: Diagnosis present

## 2021-06-19 DIAGNOSIS — Z79899 Other long term (current) drug therapy: Secondary | ICD-10-CM

## 2021-06-19 DIAGNOSIS — F333 Major depressive disorder, recurrent, severe with psychotic symptoms: Secondary | ICD-10-CM | POA: Diagnosis present

## 2021-06-19 DIAGNOSIS — I82513 Chronic embolism and thrombosis of femoral vein, bilateral: Secondary | ICD-10-CM | POA: Diagnosis present

## 2021-06-19 DIAGNOSIS — A419 Sepsis, unspecified organism: Principal | ICD-10-CM | POA: Diagnosis present

## 2021-06-19 DIAGNOSIS — D519 Vitamin B12 deficiency anemia, unspecified: Secondary | ICD-10-CM | POA: Diagnosis present

## 2021-06-19 DIAGNOSIS — F323 Major depressive disorder, single episode, severe with psychotic features: Secondary | ICD-10-CM | POA: Diagnosis present

## 2021-06-19 DIAGNOSIS — D62 Acute posthemorrhagic anemia: Secondary | ICD-10-CM | POA: Diagnosis present

## 2021-06-19 DIAGNOSIS — R109 Unspecified abdominal pain: Secondary | ICD-10-CM

## 2021-06-19 DIAGNOSIS — R1310 Dysphagia, unspecified: Secondary | ICD-10-CM

## 2021-06-19 DIAGNOSIS — A0472 Enterocolitis due to Clostridium difficile, not specified as recurrent: Secondary | ICD-10-CM | POA: Diagnosis present

## 2021-06-19 DIAGNOSIS — R338 Other retention of urine: Secondary | ICD-10-CM | POA: Diagnosis not present

## 2021-06-19 DIAGNOSIS — E210 Primary hyperparathyroidism: Secondary | ICD-10-CM | POA: Diagnosis present

## 2021-06-19 DIAGNOSIS — E559 Vitamin D deficiency, unspecified: Secondary | ICD-10-CM | POA: Diagnosis present

## 2021-06-19 DIAGNOSIS — E43 Unspecified severe protein-calorie malnutrition: Secondary | ICD-10-CM | POA: Diagnosis present

## 2021-06-19 DIAGNOSIS — R509 Fever, unspecified: Secondary | ICD-10-CM

## 2021-06-19 DIAGNOSIS — R6251 Failure to thrive (child): Secondary | ICD-10-CM | POA: Insufficient documentation

## 2021-06-19 DIAGNOSIS — U071 COVID-19: Secondary | ICD-10-CM

## 2021-06-19 DIAGNOSIS — N179 Acute kidney failure, unspecified: Secondary | ICD-10-CM | POA: Diagnosis not present

## 2021-06-19 DIAGNOSIS — E440 Moderate protein-calorie malnutrition: Secondary | ICD-10-CM | POA: Diagnosis not present

## 2021-06-19 DIAGNOSIS — J690 Pneumonitis due to inhalation of food and vomit: Secondary | ICD-10-CM

## 2021-06-19 DIAGNOSIS — Z978 Presence of other specified devices: Secondary | ICD-10-CM

## 2021-06-19 DIAGNOSIS — R0602 Shortness of breath: Secondary | ICD-10-CM

## 2021-06-19 DIAGNOSIS — Z66 Do not resuscitate: Secondary | ICD-10-CM | POA: Diagnosis present

## 2021-06-19 DIAGNOSIS — I1310 Hypertensive heart and chronic kidney disease without heart failure, with stage 1 through stage 4 chronic kidney disease, or unspecified chronic kidney disease: Secondary | ICD-10-CM | POA: Diagnosis present

## 2021-06-19 DIAGNOSIS — F419 Anxiety disorder, unspecified: Secondary | ICD-10-CM | POA: Diagnosis present

## 2021-06-19 DIAGNOSIS — E876 Hypokalemia: Secondary | ICD-10-CM | POA: Diagnosis not present

## 2021-06-19 DIAGNOSIS — N390 Urinary tract infection, site not specified: Secondary | ICD-10-CM | POA: Diagnosis present

## 2021-06-19 DIAGNOSIS — I959 Hypotension, unspecified: Secondary | ICD-10-CM | POA: Diagnosis not present

## 2021-06-19 DIAGNOSIS — R000 Tachycardia, unspecified: Secondary | ICD-10-CM | POA: Diagnosis not present

## 2021-06-19 DIAGNOSIS — E212 Other hyperparathyroidism: Secondary | ICD-10-CM | POA: Diagnosis present

## 2021-06-19 DIAGNOSIS — B965 Pseudomonas (aeruginosa) (mallei) (pseudomallei) as the cause of diseases classified elsewhere: Secondary | ICD-10-CM | POA: Diagnosis present

## 2021-06-19 DIAGNOSIS — N1831 Chronic kidney disease, stage 3a: Secondary | ICD-10-CM | POA: Diagnosis present

## 2021-06-19 DIAGNOSIS — E872 Acidosis, unspecified: Secondary | ICD-10-CM | POA: Diagnosis not present

## 2021-06-19 DIAGNOSIS — I82509 Chronic embolism and thrombosis of unspecified deep veins of unspecified lower extremity: Secondary | ICD-10-CM

## 2021-06-19 DIAGNOSIS — R310 Gross hematuria: Secondary | ICD-10-CM

## 2021-06-19 DIAGNOSIS — Z6823 Body mass index (BMI) 23.0-23.9, adult: Secondary | ICD-10-CM

## 2021-06-19 DIAGNOSIS — Z8616 Personal history of COVID-19: Secondary | ICD-10-CM | POA: Diagnosis not present

## 2021-06-19 DIAGNOSIS — F332 Major depressive disorder, recurrent severe without psychotic features: Secondary | ICD-10-CM | POA: Diagnosis not present

## 2021-06-19 DIAGNOSIS — Z7901 Long term (current) use of anticoagulants: Secondary | ICD-10-CM

## 2021-06-19 DIAGNOSIS — N200 Calculus of kidney: Secondary | ICD-10-CM | POA: Diagnosis present

## 2021-06-19 DIAGNOSIS — Z91013 Allergy to seafood: Secondary | ICD-10-CM

## 2021-06-19 DIAGNOSIS — D696 Thrombocytopenia, unspecified: Secondary | ICD-10-CM | POA: Diagnosis present

## 2021-06-19 DIAGNOSIS — Z87891 Personal history of nicotine dependence: Secondary | ICD-10-CM

## 2021-06-19 DIAGNOSIS — Z9189 Other specified personal risk factors, not elsewhere classified: Secondary | ICD-10-CM

## 2021-06-19 HISTORY — DX: Acute posthemorrhagic anemia: D62

## 2021-06-19 LAB — CBC WITH DIFFERENTIAL/PLATELET
Abs Immature Granulocytes: 0.06 10*3/uL (ref 0.00–0.07)
Basophils Absolute: 0 10*3/uL (ref 0.0–0.1)
Basophils Relative: 0 %
Eosinophils Absolute: 0 10*3/uL (ref 0.0–0.5)
Eosinophils Relative: 0 %
HCT: 28.3 % — ABNORMAL LOW (ref 36.0–46.0)
Hemoglobin: 9.2 g/dL — ABNORMAL LOW (ref 12.0–15.0)
Immature Granulocytes: 1 %
Lymphocytes Relative: 10 %
Lymphs Abs: 0.6 10*3/uL — ABNORMAL LOW (ref 0.7–4.0)
MCH: 29.9 pg (ref 26.0–34.0)
MCHC: 32.5 g/dL (ref 30.0–36.0)
MCV: 91.9 fL (ref 80.0–100.0)
Monocytes Absolute: 0.4 10*3/uL (ref 0.1–1.0)
Monocytes Relative: 6 %
Neutro Abs: 5.5 10*3/uL (ref 1.7–7.7)
Neutrophils Relative %: 83 %
Platelets: 181 10*3/uL (ref 150–400)
RBC: 3.08 MIL/uL — ABNORMAL LOW (ref 3.87–5.11)
RDW: 14.7 % (ref 11.5–15.5)
WBC: 6.6 10*3/uL (ref 4.0–10.5)
nRBC: 0 % (ref 0.0–0.2)

## 2021-06-19 LAB — GLUCOSE, CAPILLARY
Glucose-Capillary: 119 mg/dL — ABNORMAL HIGH (ref 70–99)
Glucose-Capillary: 121 mg/dL — ABNORMAL HIGH (ref 70–99)
Glucose-Capillary: 131 mg/dL — ABNORMAL HIGH (ref 70–99)

## 2021-06-19 LAB — BASIC METABOLIC PANEL
Anion gap: 6 (ref 5–15)
BUN: 29 mg/dL — ABNORMAL HIGH (ref 8–23)
CO2: 23 mmol/L (ref 22–32)
Calcium: 9.2 mg/dL (ref 8.9–10.3)
Chloride: 107 mmol/L (ref 98–111)
Creatinine, Ser: 1.19 mg/dL — ABNORMAL HIGH (ref 0.44–1.00)
GFR, Estimated: 48 mL/min — ABNORMAL LOW (ref >60)
Glucose, Bld: 95 mg/dL (ref 70–99)
Potassium: 4.5 mmol/L (ref 3.5–5.1)
Sodium: 136 mmol/L (ref 135–145)

## 2021-06-19 LAB — PREPARE RBC (CROSSMATCH)

## 2021-06-19 LAB — CBC
HCT: 23.3 % — ABNORMAL LOW (ref 36.0–46.0)
Hemoglobin: 7.4 g/dL — ABNORMAL LOW (ref 12.0–15.0)
MCH: 29.4 pg (ref 26.0–34.0)
MCHC: 31.8 g/dL (ref 30.0–36.0)
MCV: 92.5 fL (ref 80.0–100.0)
Platelets: 163 10*3/uL (ref 150–400)
RBC: 2.52 MIL/uL — ABNORMAL LOW (ref 3.87–5.11)
RDW: 14.8 % (ref 11.5–15.5)
WBC: 6.8 10*3/uL (ref 4.0–10.5)
nRBC: 0 % (ref 0.0–0.2)

## 2021-06-19 LAB — MAGNESIUM
Magnesium: 2.1 mg/dL (ref 1.7–2.4)
Magnesium: 1.9 mg/dL (ref 1.7–2.4)

## 2021-06-19 LAB — HEMOGLOBIN AND HEMATOCRIT, BLOOD
HCT: 23.6 % — ABNORMAL LOW (ref 36.0–46.0)
Hemoglobin: 7.6 g/dL — ABNORMAL LOW (ref 12.0–15.0)

## 2021-06-19 LAB — PHOSPHORUS
Phosphorus: 2.9 mg/dL (ref 2.5–4.6)
Phosphorus: 2.5 mg/dL (ref 2.5–4.6)

## 2021-06-19 LAB — CORTISOL-AM, BLOOD: Cortisol - AM: 31 ug/dL — ABNORMAL HIGH (ref 6.7–22.6)

## 2021-06-19 MED ORDER — ENOXAPARIN SODIUM 80 MG/0.8ML IJ SOSY: 65.0000 mg | PREFILLED_SYRINGE | Freq: Two times a day (BID) | INTRAMUSCULAR | 0 refills | Status: DC

## 2021-06-19 MED ORDER — ACETAMINOPHEN 650 MG RE SUPP: 650.0000 mg | Freq: Four times a day (QID) | RECTAL | Status: DC | PRN

## 2021-06-19 MED ORDER — CYANOCOBALAMIN 1000 MCG/ML IJ SOLN
1000.0000 ug | Freq: Every day | INTRAMUSCULAR | Status: DC
  Administered 2021-06-21 – 2021-07-11 (×19): 1000 ug via INTRAMUSCULAR
  Filled 2021-06-19 (×22): qty 1

## 2021-06-19 MED ORDER — VITAMIN D (ERGOCALCIFEROL) 1.25 MG (50000 UNIT) PO CAPS: 50000.0000 [IU] | ORAL_CAPSULE | ORAL | 0 refills | Status: DC

## 2021-06-19 MED ORDER — OSMOLITE 1.2 CAL PO LIQD: 1000.0000 mL | ORAL | 0 refills | Status: DC

## 2021-06-19 MED ORDER — CHLORHEXIDINE GLUCONATE CLOTH 2 % EX PADS
6.0000 | MEDICATED_PAD | Freq: Every day | CUTANEOUS | Status: DC
  Administered 2021-06-19: 6 via TOPICAL

## 2021-06-19 MED ORDER — THIAMINE HCL 100 MG/ML IJ SOLN
500.0000 mg | Freq: Two times a day (BID) | INTRAVENOUS | Status: DC
  Administered 2021-06-19 – 2021-06-29 (×20): 500 mg via INTRAVENOUS
  Filled 2021-06-19 (×23): qty 5

## 2021-06-19 MED ORDER — SODIUM CHLORIDE 0.9 % IV BOLUS
500.0000 mL | Freq: Once | INTRAVENOUS | Status: AC
Start: 2021-06-19 — End: 2021-06-19
  Administered 2021-06-19: 500 mL via INTRAVENOUS

## 2021-06-19 MED ORDER — MIRTAZAPINE 15 MG PO TABS
7.5000 mg | ORAL_TABLET | Freq: Every day | ORAL | Status: DC
  Administered 2021-06-19 – 2021-06-27 (×8): 7.5 mg
  Filled 2021-06-19 (×4): qty 1
  Filled 2021-06-19: qty 0.5
  Filled 2021-06-19 (×5): qty 1

## 2021-06-19 MED ORDER — TAMSULOSIN HCL 0.4 MG PO CAPS: 0.4000 mg | ORAL_CAPSULE | Freq: Every day | ORAL | 0 refills | Status: DC

## 2021-06-19 MED ORDER — ONDANSETRON HCL 4 MG/2ML IJ SOLN
4.0000 mg | Freq: Four times a day (QID) | INTRAMUSCULAR | Status: DC | PRN
  Administered 2021-06-29 – 2021-07-05 (×2): 4 mg via INTRAVENOUS
  Filled 2021-06-19 (×4): qty 2

## 2021-06-19 MED ORDER — TAMSULOSIN HCL 0.4 MG PO CAPS
0.4000 mg | ORAL_CAPSULE | Freq: Every day | ORAL | Status: DC
  Administered 2021-06-20 – 2021-07-16 (×27): 0.4 mg via ORAL
  Filled 2021-06-19 (×26): qty 1

## 2021-06-19 MED ORDER — CYANOCOBALAMIN 1000 MCG/ML IJ SOLN: 1000.0000 ug | Freq: Every day | INTRAMUSCULAR | 0 refills | Status: DC

## 2021-06-19 MED ORDER — SODIUM BICARBONATE 650 MG PO TABS
650.0000 mg | ORAL_TABLET | Freq: Two times a day (BID) | ORAL | Status: DC
  Administered 2021-06-19 – 2021-06-28 (×15): 650 mg
  Filled 2021-06-19 (×19): qty 1

## 2021-06-19 MED ORDER — SODIUM CHLORIDE 0.9 % IV BOLUS
500.0000 mL | Freq: Once | INTRAVENOUS | Status: AC
  Administered 2021-06-19: 500 mL via INTRAVENOUS

## 2021-06-19 MED ORDER — THIAMINE HCL 100 MG PO TABS
100.0000 mg | ORAL_TABLET | Freq: Every day | ORAL | Status: DC
  Administered 2021-06-21: 10:00:00 100 mg via ORAL
  Filled 2021-06-19: qty 1

## 2021-06-19 MED ORDER — ADULT MULTIVITAMIN LIQUID CH
15.0000 mL | Freq: Every day | ORAL | Status: DC
  Administered 2021-06-20: 15 mL via ORAL
  Filled 2021-06-19: qty 15

## 2021-06-19 MED ORDER — ACETAMINOPHEN 325 MG PO TABS
650.0000 mg | ORAL_TABLET | Freq: Four times a day (QID) | ORAL | Status: DC | PRN
  Administered 2021-06-20 – 2021-07-05 (×4): 650 mg via ORAL
  Filled 2021-06-19 (×4): qty 2

## 2021-06-19 MED ORDER — MIRTAZAPINE 7.5 MG PO TABS: 7.5000 mg | ORAL_TABLET | Freq: Every day | ORAL | 0 refills | Status: DC

## 2021-06-19 MED ORDER — HALOPERIDOL LACTATE 5 MG/ML IJ SOLN: 0.5000 mg | Freq: Every day | INTRAMUSCULAR | 0 refills | Status: DC

## 2021-06-19 MED ORDER — SODIUM CHLORIDE 0.9 % IV SOLN: INTRAVENOUS | Status: DC

## 2021-06-19 MED ORDER — THIAMINE HCL 100 MG PO TABS: 100.0000 mg | ORAL_TABLET | Freq: Every day | ORAL | 0 refills | Status: DC

## 2021-06-19 MED ORDER — SODIUM BICARBONATE 650 MG PO TABS: 650.0000 mg | ORAL_TABLET | Freq: Two times a day (BID) | ORAL | 0 refills | Status: DC

## 2021-06-19 MED ORDER — VITAMIN D (ERGOCALCIFEROL) 1.25 MG (50000 UNIT) PO CAPS
50000.0000 [IU] | ORAL_CAPSULE | ORAL | Status: DC
  Administered 2021-06-28 – 2021-07-13 (×3): 50000 [IU] via ORAL
  Filled 2021-06-19 (×5): qty 1

## 2021-06-19 MED ORDER — HALOPERIDOL LACTATE 5 MG/ML IJ SOLN
0.5000 mg | Freq: Every day | INTRAMUSCULAR | Status: DC
  Administered 2021-06-19 – 2021-07-13 (×23): 0.5 mg via INTRAVENOUS
  Filled 2021-06-19 (×26): qty 1

## 2021-06-19 MED ORDER — FREE WATER: 100.0000 mL | 0 refills | Status: DC

## 2021-06-19 MED ORDER — THIAMINE HCL 100 MG/ML IJ SOLN: 500.0000 mg | Freq: Two times a day (BID) | INTRAVENOUS | 0 refills | Status: DC

## 2021-06-19 MED ORDER — METOPROLOL TARTRATE 5 MG/5ML IV SOLN
2.5000 mg | Freq: Four times a day (QID) | INTRAVENOUS | Status: DC | PRN
  Administered 2021-06-19: 2.5 mg via INTRAVENOUS
  Filled 2021-06-19: qty 5

## 2021-06-19 MED ORDER — OSMOLITE 1.2 CAL PO LIQD
1000.0000 mL | ORAL | Status: DC
  Administered 2021-06-20: 01:00:00 1000 mL

## 2021-06-19 MED ORDER — ONDANSETRON HCL 4 MG PO TABS: 4.0000 mg | ORAL_TABLET | Freq: Four times a day (QID) | ORAL | Status: DC | PRN

## 2021-06-19 MED ORDER — FREE WATER
100.0000 mL | Status: DC
  Administered 2021-06-20 – 2021-06-26 (×37): 100 mL

## 2021-06-19 MED ORDER — ADULT MULTIVITAMIN LIQUID CH: 15.0000 mL | Freq: Every day | ORAL | 0 refills | Status: DC

## 2021-06-19 MED ORDER — SODIUM CHLORIDE 0.9% IV SOLUTION: Freq: Once | INTRAVENOUS | Status: DC

## 2021-06-19 NOTE — Progress Notes (Signed)
SLP Cancellation Note  Patient Details Name: Amber [name] MRN: [id number] DOB: [birth year]   Cancelled treatment:       Reason Eval/Treat Not Completed: Other (comment) (Per chart review, patient continues to refuse any PO's and currently has Coretrak feeding tube. Planned discharge to [facility] for ECT treatment. SLP to s/o at this time.)   Sonia Baller, MA, CCC-SLP Speech Therapy

## 2021-06-19 NOTE — Care Management Important Message (Signed)
Important Message  Patient Details  Name: Amber [name] MRN: [id number] Date of Birth: [birth year]   Medicare Important Message Given:  Yes     Orbie Pyo 06/19/2021, 2:17 PM

## 2021-06-19 NOTE — Consult Note (Addendum)
[facility] Face-to-Face Psychiatry Consult   Reason for Consult:  Depression Referring Physician:  Wynetta Fines, MD Patient Identification: Amber [name] MRN:  [id number] Principal Diagnosis: Major depressive disorder, recurrent episode, severe ([place]) Diagnosis:  Principal Problem:   Major depressive disorder, recurrent episode, severe ([place]) Active Problems:   Failure to thrive in adult   Hypernatremia   Dysphagia   COVID   MDD (major depressive disorder), single episode, severe with psychotic features ([place])   Protein-calorie malnutrition, severe   Total Time spent with patient: 20 minutes  Subjective:   Amber [name] is a 76 y.o. female patient admitted with failure to thrive.  HPI:  76 y.o. 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  Also found to be COVID positive.  Psych consult placed due to depression, SI.  Assessment 76 y.o. 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 She has been refusing medications, food and losing weight, and has intermittent understanding of  the implications of refusing food and medications (likely death).  Prior to admission to the hospital, pt was having more overt psychotic symptoms, such as hallucinations, paranoia, answering yes and no by counting words in questions rather than listening to question content; she has persistent somatic delusions that she can neither swallow nor get out of bed.  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  Started on low-dose Haldol from 1/18 which she has generally been refusing; has been consistently refusing NG tube. Started NEFM protocol 06/16/20 for both mirtazapine and haloperidol; had marginal improvement per Dr. Shanon Brow on 1/22. Today on 1/23 she  was not overly interactive with psychiatry team and refused all interactions discussed. Daughter who is HCPOA has served as primary point of contact; is in favor of short trial of aggressive intervention including NG tube and potentially ECT. Does have marginal improvement in somatic delusions - ate some carrots last night and no longer persisting in belief that she cannot swallow. Coordinated transfer to [facility] with Dr. Weber Cooks (ECT) and Dr. 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 There is evidence of significant and prolonged deterioration over the past several weeks to couple of months. She has been offered haloperidol and mirtazapine  and refused these medications due to vague paranoid ideas about not wanting to take medication.  She meets criteria for involuntary forced medication as outlined in [place] .1104. Dr. Shanon Brow  has also reviewed the case and is providing the second opinion for non-emergent forced medications (see their note from today).     Recommendations: -Can switch to tele sitter for safety.   NEFM protocol - Offer mirtazapine 7.5 mg; if refused crush and push through core track  - continue 0.5 mg IV haldol QHS which can be given over pt objection  - will need to be updated 06/22/20 (to avoid doing it on weekend)   - Optimized treatment for underline medical issues, rehydration and feeding(FTT) - Recommend Geriatric inpatient psychiatric admission after patient is medically cleared.  Thank you for this consult. Psychiatry service will continue to follow.  Subjective: Patient is seen and examined today. Patient is seen lying in bed, repeatedly says she does not want to talk. Today she says she is not eating because she has no  appetite. Attempted to discuss potential transfer to [facility] for better treatment of depression and she states that she does not want to talk about that, and that she does not want to get out of bed or move. Unable to state what she thinks will happen if she completely stops eating or getting out of bed. Denies overt SI, HI, AH/VH at this time.    Talked to daughter  on 06/13/21- [employer]. Discussed r/b/se of mirtazapine and haloperidol in detail inc but not limited to risk of dystonia; described NEFM process and daughter's role as surrogate decision maker. Answered all questions as appropriate.   Past Psychiatric History: as above  Risk to Self:  yes, by refusing food, meds Risk to Others: No Prior Inpatient Therapy:  None in the past Prior Outpatient Therapy:  PCP  Past Medical History:  Past Medical History:  Diagnosis Date   Anxiety    Hypertension    No past surgical history on file. Family History:  Family History  Problem Relation Age of Onset   High blood pressure Maternal Grandmother    Family Psychiatric  History:  Social History:  Social History   Substance and Sexual Activity  Alcohol Use Not Currently     Social History   Substance and Sexual Activity  Drug Use Not Currently    Social History   Socioeconomic History   Marital status: Married    Spouse name: Not on file   Number of children: Not on file   Years of education: Not on file   Highest education level: Not on file  Occupational History   Not on file  Tobacco Use   Smoking status: Former    Types: Cigarettes   Smokeless tobacco: Never  Substance and Sexual Activity   Alcohol use: Not Currently   Drug use: Not Currently   Sexual activity: Not Currently  Other Topics Concern   Not on file  Social History Narrative   Right handed    one story home    lives with family    Doesn't work currently   Social Determinants of Radio broadcast assistant Strain: Not on file  Food Insecurity:  Not on file  Transportation Needs: Not on file  Physical Activity: Not on file  Stress: Not on file  Social Connections: Not on file   Additional Social History:    Allergies:   Allergies  Allergen Reactions   Shellfish Allergy     "I pass out"    Labs:  Results for orders placed or performed during the hospital encounter of 06/08/21 (from the past 48 hour(s))  Glucose, capillary     Status: Abnormal   Collection Time: 06/17/21  9:38 PM  Result Value Ref Range   Glucose-Capillary 125 (H) 70 - 99 mg/dL    Comment: Glucose reference range applies only to samples taken after fasting for at least 8 hours.  Glucose, capillary     Status: Abnormal   Collection Time: 06/18/21 12:02 AM  Result Value Ref Range   Glucose-Capillary 125 (H) 70 - 99 mg/dL    Comment: Glucose reference range applies only to samples taken after fasting for at least 8 hours.  Basic metabolic panel     Status: Abnormal   Collection Time: 06/18/21  2:18 AM  Result  Value Ref Range   Sodium 135 135 - 145 mmol/L   Potassium 4.4 3.5 - 5.1 mmol/L   Chloride 107 98 - 111 mmol/L   CO2 21 (L) 22 - 32 mmol/L   Glucose, Bld 126 (H) 70 - 99 mg/dL    Comment: Glucose reference range applies only to samples taken after fasting for at least 8 hours.   BUN 34 (H) 8 - 23 mg/dL   Creatinine, Ser 1.14 (H) 0.44 - 1.00 mg/dL   Calcium 9.1 8.9 - 10.3 mg/dL   GFR, Estimated 51 (L) >60 mL/min    Comment: (NOTE) Calculated using the CKD-EPI Creatinine Equation (2021)    Anion gap 7 5 - 15    Comment: Performed at [facility] [address] [place], [place] [address]  CBC with Differential/Platelet     Status: Abnormal   Collection Time: 06/18/21  2:18 AM  Result Value Ref Range   WBC 4.9 4.0 - 10.5 K/uL   RBC 2.81 (L) 3.87 - 5.11 MIL/uL   Hemoglobin 8.5 (L) 12.0 - 15.0 g/dL   HCT 26.0 (L) 36.0 - 46.0 %   MCV 92.5 80.0 - 100.0 fL   MCH 30.2 26.0 - 34.0 pg   MCHC 32.7 30.0 - 36.0 g/dL   RDW 14.6 11.5 - 15.5 %    Platelets 133 (L) 150 - 400 K/uL   nRBC 0.0 0.0 - 0.2 %   Neutrophils Relative % 63 %   Neutro Abs 3.1 1.7 - 7.7 K/uL   Lymphocytes Relative 27 %   Lymphs Abs 1.3 0.7 - 4.0 K/uL   Monocytes Relative 6 %   Monocytes Absolute 0.3 0.1 - 1.0 K/uL   Eosinophils Relative 2 %   Eosinophils Absolute 0.1 0.0 - 0.5 K/uL   Basophils Relative 1 %   Basophils Absolute 0.0 0.0 - 0.1 K/uL   Immature Granulocytes 1 %   Abs Immature Granulocytes 0.04 0.00 - 0.07 K/uL    Comment: Performed at [facility] [address], [place], [place] [address]  Magnesium     Status: Abnormal   Collection Time: 06/18/21  2:18 AM  Result Value Ref Range   Magnesium 1.6 (L) 1.7 - 2.4 mg/dL    Comment: Performed at [facility] [address], [place], [place] [address]  Phosphorus     Status: Abnormal   Collection Time: 06/18/21  2:18 AM  Result Value Ref Range   Phosphorus 2.2 (L) 2.5 - 4.6 mg/dL    Comment: Performed at [facility] [address] [place], [place] [address]  Glucose, capillary     Status: Abnormal   Collection Time: 06/18/21  4:27 AM  Result Value Ref Range   Glucose-Capillary 124 (H) 70 - 99 mg/dL    Comment: Glucose reference range applies only to samples taken after fasting for at least 8 hours.  Glucose, capillary     Status: Abnormal   Collection Time: 06/18/21  8:18 AM  Result Value Ref Range   Glucose-Capillary 104 (H) 70 - 99 mg/dL    Comment: Glucose reference range applies only to samples taken after fasting for at least 8 hours.  Vitamin B12     Status: Abnormal   Collection Time: 06/18/21  8:35 AM  Result Value Ref Range   Vitamin B-12 135 (L) 180 - 914 pg/mL    Comment: (NOTE) This assay is not validated for testing neonatal or myeloproliferative syndrome specimens for Vitamin  B12 levels. Performed at [facility], [place] [address], [place], [place] [address]   Ammonia     Status: None   Collection Time: 06/18/21  8:35 AM  Result Value Ref Range    Ammonia 13 9 - 35 umol/L    Comment: Performed at [facility], [place] [address], [place], [place] [address]  Glucose, capillary     Status: Abnormal   Collection Time: 06/18/21 11:56 AM  Result Value Ref Range   Glucose-Capillary 113 (H) 70 - 99 mg/dL    Comment: Glucose reference range applies only to samples taken after fasting for at least 8 hours.  Glucose, capillary     Status: Abnormal   Collection Time: 06/18/21 10:02 PM  Result Value Ref Range   Glucose-Capillary 109 (H) 70 - 99 mg/dL    Comment: Glucose reference range applies only to samples taken after fasting for at least 8 hours.  Glucose, capillary     Status: Abnormal   Collection Time: 06/19/21 12:10 AM  Result Value Ref Range   Glucose-Capillary 119 (H) 70 - 99 mg/dL    Comment: Glucose reference range applies only to samples taken after fasting for at least 8 hours.  Basic metabolic panel     Status: Abnormal   Collection Time: 06/19/21  6:38 AM  Result Value Ref Range   Sodium 136 135 - 145 mmol/L   Potassium 4.5 3.5 - 5.1 mmol/L   Chloride 107 98 - 111 mmol/L   CO2 23 22 - 32 mmol/L   Glucose, Bld 95 70 - 99 mg/dL    Comment: Glucose reference range applies only to samples taken after fasting for at least 8 hours.   BUN 29 (H) 8 - 23 mg/dL   Creatinine, Ser 1.19 (H) 0.44 - 1.00 mg/dL   Calcium 9.2 8.9 - 10.3 mg/dL   GFR, Estimated 48 (L) >60 mL/min    Comment: (NOTE) Calculated using the CKD-EPI Creatinine Equation (2021)    Anion gap 6 5 - 15    Comment: Performed at [facility] [address], [place], [place] [address]  CBC with Differential/Platelet     Status: Abnormal   Collection Time: 06/19/21  6:38 AM  Result Value Ref Range   WBC 6.6 4.0 - 10.5 K/uL   RBC 3.08 (L) 3.87 - 5.11 MIL/uL   Hemoglobin 9.2 (L) 12.0 - 15.0 g/dL   HCT 28.3 (L) 36.0 - 46.0 %   MCV 91.9 80.0 - 100.0 fL   MCH 29.9 26.0 - 34.0 pg   MCHC 32.5 30.0 - 36.0 g/dL   RDW 14.7 11.5 - 15.5 %   Platelets 181 150 -  400 K/uL   nRBC 0.0 0.0 - 0.2 %   Neutrophils Relative % 83 %   Neutro Abs 5.5 1.7 - 7.7 K/uL   Lymphocytes Relative 10 %   Lymphs Abs 0.6 (L) 0.7 - 4.0 K/uL   Monocytes Relative 6 %   Monocytes Absolute 0.4 0.1 - 1.0 K/uL   Eosinophils Relative 0 %   Eosinophils Absolute 0.0 0.0 - 0.5 K/uL   Basophils Relative 0 %   Basophils Absolute 0.0 0.0 - 0.1 K/uL   Immature Granulocytes 1 %   Abs Immature Granulocytes 0.06 0.00 - 0.07 K/uL    Comment: Performed at [facility], [place] [address], [place], [place] [address]  Phosphorus     Status: None   Collection Time: 06/19/21  6:38 AM  Result Value Ref Range   Phosphorus 2.9 2.5 - 4.6 mg/dL    Comment: Performed at [facility] [address], [place], [place] [address]  Magnesium     Status: None   Collection Time: 06/19/21  6:38 AM  Result Value Ref Range   Magnesium 2.1 1.7 - 2.4 mg/dL    Comment: Performed at [facility] [address] [place], [place] [address]  Cortisol-am, blood     Status: Abnormal   Collection Time: 06/19/21  6:38 AM  Result Value Ref Range   Cortisol - AM 31.0 (H) 6.7 - 22.6 ug/dL    Comment: Performed at [facility] [address], [place], [place] [address]  Glucose, capillary     Status: Abnormal   Collection Time: 06/19/21 11:38 AM  Result Value Ref Range   Glucose-Capillary 121 (H) 70 - 99 mg/dL    Comment: Glucose reference range applies only to samples taken after fasting for at least 8 hours.  Glucose, capillary     Status: Abnormal   Collection Time: 06/19/21  4:22 PM  Result Value Ref Range   Glucose-Capillary 131 (H) 70 - 99 mg/dL    Comment: Glucose reference range applies only to samples taken after fasting for at least 8 hours.    Current Facility-Administered Medications  Medication Dose Route Frequency Provider Last Rate Last Admin   0.9 %  sodium chloride infusion   Intravenous Continuous Regalado, Belkys A, MD 100 mL/hr at 06/19/21 1441 Rate Change at  06/19/21 1441   acetaminophen (TYLENOL) 160 MG/5ML solution 500 mg  500 mg Per Tube Q4H PRN Antonieta Pert, MD       Or   acetaminophen (TYLENOL) suppository 650 mg  650 mg Rectal Q4H PRN Kc, Ramesh, MD       bisacodyl (DULCOLAX) suppository 10 mg  10 mg Rectal Daily PRN Kc, Ramesh, MD       Chlorhexidine Gluconate Cloth 2 % PADS 6 each  6 each Topical Daily Regalado, Belkys A, MD   6 each at 06/19/21 1556   cyanocobalamin ((VITAMIN B-12)) injection 1,000 mcg  1,000 mcg Intramuscular Daily Regalado, Belkys A, MD   1,000 mcg at 06/19/21 0946   enoxaparin (LOVENOX) injection 65 mg  65 mg Subcutaneous Q12H Kc, Ramesh, MD   65 mg at 06/19/21 1609   feeding supplement (OSMOLITE 1.2 CAL) liquid 1,000 mL  1,000 mL Per Tube Continuous Kc, Maren Beach, MD 65 mL/hr at 06/18/21 0522 1,000 mL at 06/18/21 0522   free water 100 mL  100 mL Per Tube Q4H Regalado, Belkys A, MD   100 mL at 06/19/21 1558   haloperidol lactate (HALDOL) injection 0.5 mg  0.5 mg Intravenous QHS Yanelli Zapanta A   0.5 mg at 06/18/21 2313   metoprolol tartrate (LOPRESSOR) injection 2.5 mg  2.5 mg Intravenous Q6H PRN Regalado, Belkys A, MD   2.5 mg at 06/19/21 1545   mirtazapine (REMERON) tablet 7.5 mg  7.5 mg Per Tube QHS Derrill Kay A, MD   7.5 mg at 06/18/21 2314   multivitamin liquid 15 mL  15 mL Oral Daily Regalado, Belkys A, MD   15 mL at 06/19/21 0946   ondansetron (ZOFRAN) tablet 4 mg  4 mg Oral Q6H PRN Wynetta Fines T, MD       Or   ondansetron [facility]) injection 4 mg  4 mg Intravenous Q6H PRN Wynetta Fines T, MD   4 mg at 06/08/21 2230  sodium bicarbonate tablet 650 mg  650 mg Per Tube BID Kc, Maren Beach, MD   650 mg at 06/19/21 0948   tamsulosin (FLOMAX) capsule 0.4 mg  0.4 mg Oral QPC supper Kc, Ramesh, MD   0.4 mg at 06/19/21 1608   thiamine 500mg  in normal saline (49ml) IVPB  500 mg Intravenous BID Regalado, Belkys A, MD 100 mL/hr at 06/19/21 1056 500 mg at 06/19/21 1056   [START ON 06/21/2021] thiamine tablet 100 mg  100 mg Oral  Daily Regalado, Belkys A, MD       Vitamin D (Ergocalciferol) (DRISDOL) capsule 50,000 Units  50,000 Units Oral Q7 days Lequita Halt, MD        Musculoskeletal: Strength & Muscle Tone:  unable to assess, patient uncooperative Gait & Station: unsteady Patient leans: N/A     Psychiatric Specialty Exam:  Presentation  General Appearance: -- (in hospital bed, had to pull covers down to examine)  Eye Contact:Minimal  Speech:Clear and Coherent  Speech Volume:Decreased  Handedness:Right   Mood and Affect  Mood:-- ("I don't want to talk")  Affect:Congruent   Thought Process  Thought Processes:-- (poverty)  Descriptions of Associations:Intact  Orientation:Full (Time, Place and Person)  Thought Content:-- (devoid of SI, HI, moderate improvement in delusions)  History of Schizophrenia/Schizoaffective disorder:No  Duration of Psychotic Symptoms:Less than six months  Hallucinations:Hallucinations: None    Ideas of Reference:None  Suicidal Thoughts:Suicidal Thoughts: No SI Passive Intent and/or Plan: Without Plan; Without Intent    Homicidal Thoughts:Homicidal Thoughts: No     Sensorium  Memory:Immediate Fair; Recent Poor; Remote Poor  Judgment:Poor  Insight:Lacking   Executive Functions  Concentration:Poor  Attention Span:Poor  [place] of Knowledge:Poor  Language:Poor   Psychomotor Activity  Psychomotor Activity:Psychomotor Activity: Psychomotor Retardation     Assets  Assets:Social Support   Sleep  Sleep:Sleep: Fair (ie sleeping too much)     Physical Exam: Physical Exam Vitals reviewed.  Constitutional:      Appearance: She is not toxic-appearing.  HENT:     Head: Normocephalic.  Pulmonary:     Effort: Pulmonary effort is normal.  Neurological:     Mental Status: She is alert and oriented to person, place, and time.   Review of Systems  Psychiatric/Behavioral:  Positive for depression. Negative for  hallucinations and suicidal ideas. The patient is not nervous/anxious.   Blood pressure 100/61, pulse 88, temperature 97.8 F (36.6 C), temperature source Oral, resp. rate 17, height 5\' 6"  (1.676 m), weight 64.1 kg, SpO2 100 %. Body mass index is 22.81 kg/m.  Disposition: Recommend psychiatric Inpatient admission when medically cleared. Supportive therapy provided about ongoing stressors. Psychiatric service will follow  Kerrie Buffalo Chadd Tollison PGy2 06/19/2021 5:18 PM

## 2021-06-19 NOTE — Progress Notes (Signed)
Report given to receiving unit. Patient's belongings at the bedside. Both daughters updated.

## 2021-06-19 NOTE — Consult Note (Signed)
Psychiatry/ECT note: Consult received from primary psychiatric consultant at [facility] to consider this patient for ECT treatment.  Chart reviewed.  Case discussed with Dr. Lovette Cliche.  76 year old woman with a past history of depression admitted to the hospital with poor p.o. intake, poor responsiveness, mental status change almost consistent with depression.  Patient has not responded to treatment so far and is debilitated to the point of requiring tube feedings and urinary catheterization.  Most likely diagnosis appears to be depression with psychotic features with severe debilitation.  Patient has medical problems but nothing acutely that would contradict indicate ECT.  76 year old woman with a history of depression currently with severe major depression life-threatening with psychotic features.  I have not examined the patient directly but as stated by the psychiatric consultant the patient is unable to give consent or participate actively in treatment.  I have spoken with the patient's daughter and recommended electroconvulsive therapy as a treatment for this patient.  Reviewed specific details including a description of the treatment and risks and benefits.  I have received word now that the daughter called the primary team back stating that she and her father are agreeing to ECT.  Plan underway to try to transfer [facility] from [facility] to [facility] at which time we can get consent signed by the family and proceed with inpatient ECT treatment.

## 2021-06-19 NOTE — Progress Notes (Signed)
Daily Progress Note   Patient Name: Amber [name]       Date: 06/19/2021 DOB: 24-Nov-1945  Age: 76 y.o. MRN#: [id number] Attending Physician: Elmarie Shiley, MD Primary Care Physician: Lindell Spar, MD Admit Date: 06/08/2021  Reason for Consultation/Follow-up: Establishing goals of care  Patient Profile/HPI: 76 y.o. female  with past medical history of hypertension, CKD stage 3b, vitamin D deficiency, secondary hyperparathyroidism, urinary incontinence, left sided adnexal mass (referred to OB/GYN), recent failure to thrive admitted on 06/08/2021 with failure to thrive with signs of acute depression/anxiety leading to physical decline with symptoms reported of weight loss, swallowing difficulty, hallucinations, paranoia, confusion, and family report she has expressed wanting to end her life. Noteably she was COVID positive on admission.      Per family she has been on the decline for several weeks, was hiding her medications and not taking them, not eating or drinking.    Last PMT consult notes on 1/16 and 1/17 indicated: "trial of aggressive care and interventions including short term artificial feeding" "Family wishes for trial of aggressive care and interventions including work up of any medical conditions that could be leading to decline (adnexal mass??), temporary feeding tube (they would not want long term artificial feeding - this is stated in her Living Will that I reviewed), and inpatient psychiatric treatment including ECT if indicated. They want to see if trial of aggressive care will provide her with improvement in her status. They do understand that she has overall failure to thrive and there is the chance this is may not reversible."  1/20- Forced medication and feeding started via  cortrak  Subjective: Patient is awake and alert. Daughter, Almyra Free is at bedside. Denell is needing a foley due to urinary retention.  Called and spoke with daughter [name].  [name] noted that her mom's state was improved on Friday- however, on Sunday she felt she had regressed.  [name] continues to confirm wishes to continue to "aggressively attack" her mom's depression in hopes to improve her mental health.  [name] wants to ensure that all medications and interventions to treat [name]'s depression are attempted, and if after medical treatment is optimized- if Ruthella continues to fail to thrive, then she and their family would accept that.  We discussed long term artifical feeding tube and how this relates  to [name]'s living will that indicates she would not want a long term feeding tube in the face of an irreversible illness.  [name] shares that if a PEG is required as an intervention to attempt to REVERSE her mom's illness- then they would be accepting- however, if it is determined that her Mom's depression is irreversible- then they would want to honor the intent of the Advanced Directive and not prolong her in an irreversible state with artificial nutrition.   Review of Systems  Constitutional:  Positive for malaise/fatigue.    Physical Exam Vitals and nursing note reviewed.  Pulmonary:     Effort: Pulmonary effort is normal.  Neurological:     Mental Status: She is alert.  Psychiatric:     Comments: Thought content and judgement impaired            Vital Signs: BP 118/69 (BP Location: Right Arm)    Pulse (!) 120    Temp 98.6 F (37 C) (Axillary)    Resp 19    Ht 5\' 6"  (1.676 m)    Wt 64.1 kg    SpO2 99%    BMI 22.81 kg/m  SpO2: SpO2: 99 % O2 Device: O2 Device: Room Air O2 Flow Rate:    Intake/output summary:  Intake/Output Summary (Last 24 hours) at 06/19/2021 1131 Last data filed at 06/18/2021 2202 Gross per 24 hour  Intake 438.15 ml  Output 0 ml  Net 438.15 ml   LBM: Last  BM Date: 06/18/21 Baseline Weight: Weight: 71.2 kg Most recent weight: Weight: 64.1 kg       Palliative Assessment/Data: PPS: 40%      Patient Active Problem List   Diagnosis Date Noted   Protein-calorie malnutrition, severe 06/19/2021   MDD (major depressive disorder), single episode, severe with psychotic features ([place])    Dysphagia    COVID    Major depressive disorder, recurrent episode, severe ([place]) 06/09/2021   Failure to thrive in adult 06/08/2021   Hypernatremia 06/08/2021   Primary hyperparathyroidism ([place]) 06/07/2021   Hyperthyroidism 04/25/2021   Encounter for examination following treatment at hospital 04/25/2021   Gastroesophageal reflux disease 04/25/2021   Gait abnormality 03/29/2021   Urinary incontinence 03/29/2021   Moderate protein-calorie malnutrition ([place]) 03/29/2021   Depression with anxiety 03/29/2021   Aortic atherosclerosis ([place]) 03/29/2021   Adnexal mass 03/29/2021    Palliative Care Assessment & Plan    Assessment/Recommendations/Plan  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 Tyrell Antonio, Dr. Lovette Cliche, [name] (patient's HCPOA)  Thank you for allowing the Palliative Medicine Team to assist in the care of this patient.    Mariana Kaufman, AGNP-C Palliative Medicine   Please contact Palliative Medicine Team phone at [phone] for questions and concerns.

## 2021-06-19 NOTE — Discharge Summary (Addendum)
Physician Discharge Summary  Amber [name] BPZ:[id number] DOB: Feb 21, 1946 DOA: 06/08/2021  PCP: Lindell Spar, MD  Admit date: 06/08/2021 Discharge date: 06/19/2021  Admitted From: Home  Disposition: Transfer to [facility] for ETC under medicine Service.   Recommendations:  Continue with IV thiamine 500 mg IV BID for 2 days, the resume 100 mg daily.  Continue with IM B 12 injection for 5 days, then order oral B 12 supplement.  Continue with IV Haldol.  Dr Weber Cooks aware of patient, plan to start ETC>    Discharge Condition: Stable.  CODE STATUS: DNR Diet recommendation: Soft Diet, patient refuse to eat, she has now Core-track for tube feedings.   Brief/Interim Summary: 76 year old with PMH of HTN CKD stage III b, vitamin D, secondary hyperparathyroidism who presented with failure to thrive.  Per daughter report, patient started having a swallowing problem for several weeks with dry heaves after eating solid food, episode of confusion, hallucination, complaining of feeling something stuck in her throat.  She refused to eat solid food.  Patient also reporter she " has had enough  and wanted to end her life".  Husband had COVID 3 weeks ago, patient also had some sore throat and stuffy nose.   Of note October 2022 patient had neurology follow-up, she had MRI brain and MR venogram CT venogram negative for acute stroke, no dural venous sinus thrombosis.   Evaluation by PCP as an outpatient pelvis ultrasound showed a mass for which she was referred for outpatient OB gynecology. 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  Severe vitamin D deficiency.   In the ED patient was noted to be hypotensive, tachycardic, lactic acidosis lactic acid of 6.1, creatinine 1.8, hypernatremia, hypercalcemia.  She received IV fluids.  CT head chronic atrophy with white matter microvascular disease.  Chest x-ray no active disease.  Renal  ultrasound 2.2 cm right renal calculus and mild right side hydronephrosis.  COVID-19 positive.   Patient has had episode of depression over the years.  Over the last 2 months she become increasingly paranoid, having hallucination decreased oral intake.  She was evaluated by neurology outpatient and MRI was negative.   Patient evaluated by psych, patient felt to have depression with psychotic features.  Core track tube was placed.  She has been getting her medications through the PEG tube. Psych recommend Force medications and tube feeding.   Plan to transfer to [facility] for ECT.   1-Failure to thrive with poor oral intake ?  Dysphagia/choking sensation Psychotic depression refusing to eat: -Patient refused GI evaluation and EGD esophagogram.  She was refusing to eat. -Per psych patient lack capacity for medical decision, in regards NG tube placement. -Patient currently has core track, continue with tube feedings. -Medications to be administered through core track. -Needs inpatient Geri psych unit -Psych consulted again, any other medications recommendation for depression.  Prognosis in regards psych condition to help  determine if patient will need PEG tube. -We will also consult palliative care, to help family with decision in regards to PEG tube placement, review patient's living will./  -on haldol IV and remeron.   -plan to transfer to [facility] for ECT.   2-Mild confusion, mild acute metabolic encephalopathy In the setting of multiple medical comorbidities, dehydration, dysphagia, COVID-19 infection, psych issues. MRI/CT venogram done as an outpatient 03/2021 negative for stroke. T head performed this admission no acute findings. B12 low, start B12 injections, needs injection  for 5 days then oral.  Ammonia level normal. Cortisol level Normal.    3-Right distal femoral vein age-indeterminate nonocclusive DVT Chronic nonocclusive DVT left popliteal vein Elevated D-dimer in the setting of DVT  and COVID -Currently on Lovenox. -Plan to transition on Eliquis on discharge.    4-AKI on CKD 3B Dehydration;  Baseline creatinine 1.5 Prerenal AKI secondary to hypotension, volume depletion, poor oral intake Renal ultrasound with bilateral nonobstructive renal calculus and mild right-sided hydronephrosis AKI resolved.   Acidosis, metabolic acidosis: Due to hypotension, dehydration.  Resolved Hypernatremia: resolved.    Hypercalcemia Secondary hyperparathyroidism Vitamin D deficiency: She received IV fluids.  She was a started on vitamin D supplement   Hypokalemia/hypophosphatemia/hypomagnesemia: Replete with IV magnesium and phosphorus today.   Mild elevation of troponin demand ischemia: Resolved Echo ejection fraction 45 to 50% no regional wall motion abnormality.   COVID-19 infection: Received 3 days of attempt severe   Left uterine 3.9 x 2.9 cm fibroid: She will need to follow-up by gynecologist.   Fecal impaction: Resolved having multiple bowel movement, holding MiraLAX.   Urine retention: She was a started on Flomax.  In and out as need.foley catheter placed 1/24. Hematuria; suspect traumatic from foley placement and patient on anticoagulation. .  Monitor, HB/    Sinus Tachycardia;  Hb stable this am.  IV bolus, IV fluids.  PRN metoprolol/  Sepsis was ruled out up on discharge 1/24.   Discharge Diagnoses:  Principal Problem:   Major depressive disorder, recurrent episode, severe ([place]) Active Problems:   Failure to thrive in adult   Hypernatremia   Dysphagia   COVID   MDD (major depressive disorder), single episode, severe with psychotic features ([employer])   Protein-calorie malnutrition, severe    Discharge Instructions   Allergies as of 06/19/2021       Reactions   Shellfish Allergy    "I pass out"        Medication List     STOP taking these medications    famotidine 20 MG tablet Commonly known as: Pepcid   folic acid 1 MG tablet Commonly  known as: FOLVITE   ondansetron 4 MG tablet Commonly known as: Zofran   vitamin B-12 500 MCG tablet Commonly known as: CYANOCOBALAMIN Replaced by: cyanocobalamin 1000 MCG/ML injection       TAKE these medications    acetaminophen 500 MG tablet Commonly known as: TYLENOL Take 500 mg by mouth every 6 (six) hours as needed for moderate pain or mild pain.   cyanocobalamin 1000 MCG/ML injection Commonly known as: (VITAMIN B-12) Inject 1 mL (1,000 mcg total) into the muscle daily. Start taking on: June 20, 2021 Replaces: vitamin B-12 500 MCG tablet   enoxaparin 80 MG/0.8ML injection Commonly known as: LOVENOX Inject 0.65 mLs (65 mg total) into the skin every 12 (twelve) hours.   feeding supplement (OSMOLITE 1.2 CAL) Liqd Place 1,000 mLs into feeding tube continuous.   free water Soln Place 100 mLs into feeding tube every 4 (four) hours.   haloperidol lactate 5 MG/ML injection Commonly known as: HALDOL Inject 0.1 mLs (0.5 mg total) into the vein at bedtime.   mirtazapine 7.5 MG tablet Commonly known as: REMERON Place 1 tablet (7.5 mg total) into feeding tube at bedtime. What changed: how to take this   multivitamin Liqd Take 15 mLs by mouth daily. Start taking on: June 20, 2021   sodium bicarbonate 650 MG tablet Place 1 tablet (650 mg total) into feeding tube 2 (two) times daily.  sodium chloride 0.9 % SOLN 50 mL with thiamine 100 MG/ML SOLN 500 mg Inject 500 mg into the vein 2 (two) times daily.   tamsulosin 0.4 MG Caps capsule Commonly known as: FLOMAX Take 1 capsule (0.4 mg total) by mouth daily after supper.   thiamine 100 MG tablet Take 1 tablet (100 mg total) by mouth daily. Start taking on: June 21, 2021   Vitamin D (Ergocalciferol) 1.25 MG (50000 UNIT) Caps capsule Commonly known as: DRISDOL Take 1 capsule (50,000 Units total) by mouth every 7 (seven) days. Start taking on: June 22, 2021        Allergies  Allergen Reactions    Shellfish Allergy     "I pass out"    Consultations: Psych Palliative    Procedures/Studies: CT Head Wo Contrast  Result Date: 06/08/2021 CLINICAL DATA:  Mental status change EXAM: CT HEAD WITHOUT CONTRAST TECHNIQUE: Contiguous axial images were obtained from the base of the skull through the vertex without intravenous contrast. RADIATION DOSE REDUCTION: This exam was performed according to the departmental dose-optimization program which includes automated exposure control, adjustment of the mA and/or kV according to patient size and/or use of iterative reconstruction technique. COMPARISON:  None. FINDINGS: Brain: No acute intracranial hemorrhage. No focal mass lesion. No CT evidence of acute infarction. No midline shift or mass effect. No hydrocephalus. Basilar cisterns are patent. There are periventricular and subcortical white matter hypodensities. Generalized cortical atrophy. Vascular: No hyperdense vessel or unexpected calcification. Skull: Normal. Negative for fracture or focal lesion. Sinuses/Orbits: Paranasal sinuses and mastoid air cells are clear. Orbits are clear. Other: None. IMPRESSION: No acute intracranial findings. Chronic atrophy and white matter microvascular disease Electronically Signed   By: Suzy Bouchard M.D.   On: 06/08/2021 17:43   CT ABDOMEN PELVIS W CONTRAST  Result Date: 06/14/2021 CLINICAL DATA:  Left adnexal mass, dysphagia, malignancy suspected EXAM: CT ABDOMEN AND PELVIS WITH CONTRAST TECHNIQUE: Multidetector CT imaging of the abdomen and pelvis was performed using the standard protocol following bolus administration of intravenous contrast. RADIATION DOSE REDUCTION: This exam was performed according to the departmental dose-optimization program which includes automated exposure control, adjustment of the mA and/or kV according to patient size and/or use of iterative reconstruction technique. CONTRAST:  123mL OMNIPAQUE IOHEXOL 300 MG/ML  SOLN COMPARISON:  CT  abdomen pelvis, 03/05/2021, pelvic ultrasound, 04/11/2021 FINDINGS: Lower chest: No acute abnormality. Hepatobiliary: No solid liver abnormality is seen. Hepatic steatosis. No gallstones, gallbladder wall thickening, or biliary dilatation. Pancreas: Unremarkable. No pancreatic ductal dilatation or surrounding inflammatory changes. Spleen: Normal in size without significant abnormality. Adrenals/Urinary Tract: Adrenal glands are unremarkable. Large calculus in the right renal pelvis measuring 1.5 cm (series 4, image 32). Additional punctuate nonobstructive calculi in the inferior pole of left kidney (series 7, image 49). No hydronephrosis. Bladder is unremarkable. Stomach/Bowel: Stomach is within normal limits. Appendix appears normal. Descending and sigmoid diverticulosis. Large stool ball in the rectum measuring at least 7.3 cm, with mild circumferential rectal wall thickening and adjacent fat stranding (series 4, image 78). Vascular/Lymphatic: Aortic atherosclerosis. No enlarged abdominal or pelvic lymph nodes. Reproductive: Partially exophytic mass of the left aspect of the uterine fundus, with homogeneous enhancement and texture to the adjacent myometrium, measuring 3.9 x 2.9 cm (series 4, image 69). This is additionally clearly distinct from the normal left ovary (series 4, image 62). Other: No abdominal wall hernia or abnormality. No ascites. Musculoskeletal: No acute or significant osseous findings. IMPRESSION: 1. Partially exophytic mass of the left aspect of  the uterine fundus, with homogeneous enhancement and texture to the adjacent myometrium, measuring 3.9 x 2.9 cm. This is additionally clearly distinct from the normal left ovary. Uterine fibroid is strongly favored, in keeping with findings of prior ultrasound, although this could be definitively characterized by contrast enhanced pelvic MRI on a nonemergent, outpatient basis if desired. 2. No evidence of lymphadenopathy or metastatic disease in the  abdomen or pelvis. 3. Large stool ball in the rectum measuring at least 7.3 cm, with mild circumferential rectal wall thickening and adjacent fat stranding. Correlate for fecal impaction and stercoral colitis. 4. Nonobstructive bilateral nephrolithiasis. 5. Hepatic steatosis. 6. Descending and sigmoid diverticulosis without evidence of acute diverticulitis. Aortic Atherosclerosis (ICD10-I70.0). Electronically Signed   By: Delanna Ahmadi M.D.   On: 06/14/2021 08:18   US RENAL  Result Date: 06/08/2021 CLINICAL DATA:  Acute renal injury. EXAM: RENAL / URINARY TRACT ULTRASOUND COMPLETE COMPARISON:  Renal CT, dated March 05, 2021. FINDINGS: Right Kidney: Renal measurements: 9.9 cm x 6.0 cm x 5.6 cm = volume: 176.41 mL. Echogenicity within normal limits. No mass is visualized. A 2.2 cm shadowing echogenic focus is seen within the right renal pelvis. Mild right-sided hydronephrosis is noted. Left Kidney: Renal measurements: 9.2 cm x 5.1 cm x 5.5 cm = volume: 134.44 mL. Echogenicity within normal limits. No mass or hydronephrosis visualized. Bladder: Appears normal for degree of bladder distention. Other: None. IMPRESSION: 2.2 cm right renal calculus within the right renal pelvis, with mild right-sided hydronephrosis. This corresponds to the findings seen on the prior abdomen and pelvis CT. Electronically Signed   By: Virgina Norfolk M.D.   On: 06/08/2021 20:01   DG Chest Port 1 View  Result Date: 06/11/2021 CLINICAL DATA:  Short of breath EXAM: PORTABLE CHEST 1 VIEW COMPARISON:  06/08/2021 FINDINGS: Normal cardiac silhouette with ectatic aorta. Mild linear markings in the lateral aspect of the LEFT lung. No pneumothorax. No pulmonary edema. No acute osseous abnormality. IMPRESSION: Mild LEFT midlung atelectasis versus early infiltrate. Electronically Signed   By: Suzy Bouchard M.D.   On: 06/11/2021 08:34   DG Chest Portable 1 View  Result Date: 06/08/2021 CLINICAL DATA:  Hypotension.  Decreased oral  intake. EXAM: PORTABLE CHEST 1 VIEW COMPARISON:  Chest radiograph dated April 24, 2021. FINDINGS: The heart size and mediastinal contours are within normal limits. Both lungs are clear. The visualized skeletal structures are unremarkable. IMPRESSION: No active disease. Electronically Signed   By: Keane Police D.O.   On: 06/08/2021 17:23   DG Abd Portable 1V  Result Date: 06/15/2021 CLINICAL DATA:  Feeding tube placement. EXAM: PORTABLE ABDOMEN - 1 VIEW COMPARISON:  No comparison studies available. FINDINGS: 1449 hours. The tip of the feeding tube is in the antro pyloric region of the stomach directed towards the duodenum. Visualized upper abdomen shows a nonspecific bowel gas pattern. IMPRESSION: Feeding tube tip is at the level of the pylorus, directed towards the duodenum. Electronically Signed   By: Misty Stanley M.D.   On: 06/15/2021 15:00   VAS [place] LOWER EXTREMITY VENOUS (DVT)  Result Date: 06/11/2021  Lower Venous DVT Study Patient Name:  Amber [name]  Date of Exam:   06/10/2021 Medical Rec #: [id number]      Accession #:    [id number] Date of Birth: [birth year]      Patient Gender: F Patient Age:   30 years Exam Location:  [facility] Procedure:      VAS [place] LOWER EXTREMITY VENOUS (DVT) Referring Phys:  [facility] --------------------------------------------------------------------------------  Indications: Edema.  Limitations: Poor ultrasound/tissue interface and body habitus. Comparison Study: No previous exams Performing Technologist: Jody Hill RVT, RDMS  Examination Guidelines: A complete evaluation includes B-mode imaging, spectral Doppler, color Doppler, and power Doppler as needed of all accessible portions of each vessel. Bilateral testing is considered an integral part of a complete examination. Limited examinations for reoccurring indications may be performed as noted. The reflux portion of the exam is performed with the patient in reverse Trendelenburg.   +---------+---------------+---------+-----------+----------+-----------------+  RIGHT     Compressibility Phasicity Spontaneity Properties Thrombus Aging     +---------+---------------+---------+-----------+----------+-----------------+  CFV       Full            Yes       Yes                                       +---------+---------------+---------+-----------+----------+-----------------+  SFJ       Full                                                                +---------+---------------+---------+-----------+----------+-----------------+  FV Prox   Full            Yes       Yes                                       +---------+---------------+---------+-----------+----------+-----------------+  FV Mid    Full            Yes       Yes                                       +---------+---------------+---------+-----------+----------+-----------------+  FV Distal Partial         Yes       Yes                    Age Indeterminate  +---------+---------------+---------+-----------+----------+-----------------+  PFV       Full                                                                +---------+---------------+---------+-----------+----------+-----------------+  POP       Full            Yes       Yes                                       +---------+---------------+---------+-----------+----------+-----------------+  PTV       Full                                                                +---------+---------------+---------+-----------+----------+-----------------+  PERO      Full                                                                +---------+---------------+---------+-----------+----------+-----------------+   +---------+---------------+---------+-----------+----------+--------------+  LEFT      Compressibility Phasicity Spontaneity Properties Thrombus Aging  +---------+---------------+---------+-----------+----------+--------------+  CFV       Full            Yes       Yes                                     +---------+---------------+---------+-----------+----------+--------------+  SFJ       Full                                                             +---------+---------------+---------+-----------+----------+--------------+  FV Prox   Full            Yes       Yes                                    +---------+---------------+---------+-----------+----------+--------------+  FV Mid    Full            Yes       Yes                                    +---------+---------------+---------+-----------+----------+--------------+  FV Distal Full            Yes       Yes                                    +---------+---------------+---------+-----------+----------+--------------+  PFV       Full                                                             +---------+---------------+---------+-----------+----------+--------------+  POP       Partial         Yes       Yes                    Chronic         +---------+---------------+---------+-----------+----------+--------------+  PTV       Full                                                             +---------+---------------+---------+-----------+----------+--------------+  PERO      Full                                                             +---------+---------------+---------+-----------+----------+--------------+     Summary: BILATERAL: - No evidence of superficial venous thrombosis in the lower extremities, bilaterally. -No evidence of popliteal cyst, bilaterally. RIGHT: - Findings consistent with age indeterminate non-occlusive deep vein thrombosis involving the right distal femoral vein.  LEFT: - Findings consistent with chronic non-occlusive deep vein thrombosis involving the left popliteal vein.  *See table(s) above for measurements and observations. Electronically signed by Jamelle Haring on 06/11/2021 at 10:22:38 AM.    Final    VAS [place] UPPER EXTREMITY VENOUS DUPLEX  Result Date: 06/11/2021 UPPER VENOUS STUDY  Patient Name:  Amber [name]  Date of Exam:   06/10/2021 Medical Rec #: [id number]      Accession #:    [id number] Date of Birth: 1945/11/28      Patient Gender: F Patient Age:   47 years Exam Location:  [facility] Procedure:      VAS [place] UPPER EXTREMITY VENOUS DUPLEX Referring Phys: [facility] [place] --------------------------------------------------------------------------------  Indications: Edema Limitations: Body habitus and poor ultrasound/tissue interface. Comparison Study: No previous exams Performing Technologist: Jody Hill RVT, RDMS  Examination Guidelines: A complete evaluation includes B-mode imaging, spectral Doppler, color Doppler, and power Doppler as needed of all accessible portions of each vessel. Bilateral testing is considered an integral part of a complete examination. Limited examinations for reoccurring indications may be performed as noted.  Right Findings: +-----+------------+---------+-----------+----------+-------+  RIGHT Compressible Phasicity Spontaneous Properties Summary  +-----+------------+---------+-----------+----------+-------+  IJV       Full        Yes        Yes                         +-----+------------+---------+-----------+----------+-------+  Left Findings: +----------+------------+---------+-----------+----------+---------------------+  LEFT       Compressible Phasicity Spontaneous Properties        Summary         +----------+------------+---------+-----------+----------+---------------------+  IJV            Full        Yes        Yes                                       +----------+------------+---------+-----------+----------+---------------------+  Subclavian     Full        Yes        Yes                                       +----------+------------+---------+-----------+----------+---------------------+  Axillary       Full        Yes        Yes                                       +----------+------------+---------+-----------+----------+---------------------+  Brachial  Full         Yes        Yes                 Only one of paired                                                               visualized on this                                                                      exam           +----------+------------+---------+-----------+----------+---------------------+  Radial         Full                                                             +----------+------------+---------+-----------+----------+---------------------+  Ulnar                                                    Not seen on this exam  +----------+------------+---------+-----------+----------+---------------------+  Cephalic       Full                                                             +----------+------------+---------+-----------+----------+---------------------+  Basilic        Full        Yes        Yes                 Not well visualized   +----------+------------+---------+-----------+----------+---------------------+  Summary:  Right: No evidence of thrombosis in the subclavian.  Left: No evidence of deep vein thrombosis in the upper extremity. No evidence of superficial vein thrombosis in the upper extremity. However, unable to visualize the ulnar veins. Subcutaneous edema seen in area of forearm and hand.  *See table(s) above for measurements and observations.  Diagnosing physician: Jamelle Haring Electronically signed by Jamelle Haring on 06/11/2021 at 10:23:24 AM.    Final    ECHOCARDIOGRAM LIMITED  Result Date: 06/09/2021    ECHOCARDIOGRAM LIMITED REPORT   Patient Name:   Amber [name] Date of Exam: 06/09/2021 Medical Rec #:  [id number]     Height:       66.0 in Accession #:    [id number]    Weight:       157.4 lb Date of Birth:  Mar 13, 1946     BSA:          1.806 m Patient Age:  75 years      BP:           133/86 mmHg Patient Gender: F             HR:           84 bpm. Exam Location:  Inpatient Procedure: Limited Echo, Limited Color Doppler and Cardiac Doppler Indications:   elevated troponin   History:       Patient has no prior history of Echocardiogram examinations.                Covid.  Sonographer:   Johny Chess RDCS Referring      [phone] Lequita Halt Phys: IMPRESSIONS  1. Left ventricular ejection fraction, by estimation, is 45 to 50%. The left ventricle has mildly decreased function. The left ventricle has no regional wall motion abnormalities. There is mild concentric left ventricular hypertrophy. Left ventricular diastolic parameters are consistent with Grade I diastolic dysfunction (impaired relaxation).  2. Right ventricular systolic function is normal. The right ventricular size is normal. There is normal pulmonary artery systolic pressure.  3. Mild mitral valve regurgitation.  4. Aortic valve regurgitation is not visualized.  5. Ectactic aorta. 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  6. The inferior vena cava is normal in size with greater than 50% respiratory variability, suggesting right atrial pressure of 3 mmHg. FINDINGS  Left Ventricle: Left ventricular ejection fraction, by estimation, is 45 to 50%. The left ventricle has mildly decreased function. The left ventricle has no regional wall motion abnormalities. There is mild concentric left ventricular hypertrophy. Abnormal (paradoxical) septal motion, consistent with left bundle branch block. Left ventricular diastolic parameters are consistent with Grade I diastolic dysfunction (impaired relaxation). Right Ventricle: The right ventricular size is normal. Right ventricular systolic function is normal. There is normal pulmonary artery systolic pressure. The tricuspid regurgitant velocity is 2.16 m/s, and with an assumed right atrial pressure of 3 mmHg,  the estimated right ventricular systolic pressure is 71.2 mmHg. Mitral Valve: Mild mitral valve regurgitation. Tricuspid Valve: The tricuspid valve is not well visualized. Tricuspid valve regurgitation is not demonstrated. Aortic Valve: Aortic valve regurgitation is not  visualized. Pulmonic Valve: The pulmonic valve was not well visualized. Pulmonic valve regurgitation is not visualized. Aorta: Ectactic aorta. Small ascending aortic aneurysm 3.9 cm. Venous: The inferior vena cava is normal in size with greater than 50% respiratory variability, suggesting right atrial pressure of 3 mmHg.  LV Volumes (MOD) LV vol d, MOD A2C: 94.6 ml Diastology LV vol s, MOD A2C: 55.5 ml LV e' medial:    4.79 cm/s LV SV MOD A2C:     39.1 ml LV E/e' medial:  10.6                            LV e' lateral:   7.18 cm/s                            LV E/e' lateral: 7.0  IVC IVC diam: 1.40 cm AORTIC VALVE LVOT Vmax:   63.30 cm/s LVOT Vmean:  38.300 cm/s LVOT VTI:    0.093 m  AORTA Ao Asc diam: 3.90 cm MITRAL VALVE               TRICUSPID VALVE MV Area (PHT): 3.08 cm    TR Peak grad:   18.7 mmHg MV Decel Time: 246 msec  TR Vmax:        216.00 cm/s MV E velocity: 50.60 cm/s MV A velocity: 84.00 cm/s  SHUNTS MV E/A ratio:  0.60        Systemic VTI: 0.09 m Phineas Inches Electronically signed by Phineas Inches Signature Date/Time: 06/09/2021/1:54:37 PM    Final      Subjective: She is alert, refuse to get out of bed.  She was retaining 600 cc urine. Had foley catheter placed.  She report mild abdominal pain.   Discharge Exam: Vitals:   06/19/21 0800 06/19/21 1318  BP: 118/69 110/77  Pulse: (!) 120 (!) 126  Resp: 19 18  Temp: 98.6 F (37 C) 98.7 F (37.1 C)  SpO2: 99% 100%     General: Pt is alert, awake, not in acute distress Cardiovascular: RRR, S1/S2 +, no rubs, no gallops Respiratory: CTA bilaterally, no wheezing, no rhonchi Abdominal: Soft, NT, ND, bowel sounds + Extremities: no edema, no cyanosis    The results of significant diagnostics from this hospitalization (including imaging, microbiology, ancillary and laboratory) are listed below for reference.     Microbiology: Recent Results (from the past 240 hour(s))  Urine Culture     Status: Abnormal   Collection Time: 06/09/21  11:18 PM   Specimen: In/Out Cath Urine  Result Value Ref Range Status   Specimen Description IN/OUT CATH URINE  Final   Special Requests NONE  Final   Culture (A)  Final    1,000 COLONIES/mL GRAM POSITIVE COCCOBACILLUS Standardized susceptibility testing for this organism is not available. Performed at [facility], [place] [address], [place], [place] [address]    Report Status 06/12/2021 FINAL  Final     Labs: BNP (last 3 results) No results for input(s): BNP in the last 8760 hours. Basic Metabolic Panel: Recent Labs  Lab 06/15/21 0429 06/17/21 0753 06/18/21 0218 06/19/21 0638  NA 143 137 135 136  K 5.0 4.9 4.4 4.5  CL 116* 112* 107 107  CO2 20* 21* 21* 23  GLUCOSE 91 110* 126* 95  BUN 12 36* 34* 29*  CREATININE 1.21* 1.20* 1.14* 1.19*  CALCIUM 9.5 9.2 9.1 9.2  MG  --   --  1.6* 2.1  PHOS  --   --  2.2* 2.9   Liver Function Tests: No results for input(s): AST, ALT, ALKPHOS, BILITOT, PROT, ALBUMIN in the last 168 hours. No results for input(s): LIPASE, AMYLASE in the last 168 hours. Recent Labs  Lab 06/18/21 0835  AMMONIA 13   CBC: Recent Labs  Lab 06/15/21 0429 06/17/21 0753 06/18/21 0218 06/19/21 0638  WBC 7.0 5.0 4.9 6.6  NEUTROABS  --  3.4 3.1 5.5  HGB 10.2* 8.5* 8.5* 9.2*  HCT 31.9* 26.2* 26.0* 28.3*  MCV 93.0 93.9 92.5 91.9  PLT 135* 123* 133* 181   Cardiac Enzymes: No results for input(s): CKTOTAL, CKMB, CKMBINDEX, TROPONINI in the last 168 hours. BNP: Invalid input(s): POCBNP CBG: Recent Labs  Lab 06/18/21 0818 06/18/21 1156 06/18/21 2202 06/19/21 0010 06/19/21 1138  GLUCAP 104* 113* 109* 119* 121*   D-Dimer No results for input(s): DDIMER in the last 72 hours. Hgb A1c No results for input(s): HGBA1C in the last 72 hours. Lipid Profile No results for input(s): CHOL, HDL, LDLCALC, TRIG, CHOLHDL, LDLDIRECT in the last 72 hours. Thyroid function studies No results for input(s): TSH, T4TOTAL, T3FREE, THYROIDAB in the last 72  hours.  Invalid input(s): FREET3 Anemia work up [employer]    06/18/21 [phone]  VITAMINB12 135*   Urinalysis    Component Value Date/Time   COLORURINE YELLOW 06/08/2021 2234   APPEARANCEUR CLEAR 06/08/2021 2234   APPEARANCEUR Cloudy (A) 03/30/2021 1431   LABSPEC 1.025 06/08/2021 2234   PHURINE 5.5 06/08/2021 2234   GLUCOSEU NEGATIVE 06/08/2021 2234   HGBUR LARGE (A) 06/08/2021 2234   BILIRUBINUR NEGATIVE 06/08/2021 2234   BILIRUBINUR Negative 03/30/2021 1431   KETONESUR NEGATIVE 06/08/2021 2234   PROTEINUR NEGATIVE 06/08/2021 2234   NITRITE NEGATIVE 06/08/2021 2234   LEUKOCYTESUR TRACE (A) 06/08/2021 2234   Sepsis Labs Invalid input(s): PROCALCITONIN,  WBC,  LACTICIDVEN Microbiology Recent Results (from the past 240 hour(s))  Urine Culture     Status: Abnormal   Collection Time: 06/09/21 11:18 PM   Specimen: In/Out Cath Urine  Result Value Ref Range Status   Specimen Description IN/OUT CATH URINE  Final   Special Requests NONE  Final   Culture (A)  Final    1,000 COLONIES/mL GRAM POSITIVE COCCOBACILLUS Standardized susceptibility testing for this organism is not available. Performed at [facility], [place] [address], [place], [place] [address]    Report Status 06/12/2021 FINAL  Final     Time coordinating discharge: 40 minutes  SIGNED:   Elmarie Shiley, MD  [facility]

## 2021-06-19 NOTE — H&P (Signed)
History and Physical    Amber [name] HQI:[id number] DOB: [birth year] DOA: 06/19/2021  PCP: Lindell Spar, MD   Patient coming from: [facility]  I have personally briefly reviewed patient's relevant medical records in [place]  Chief Complaint: ECT for psychotic depression causing nutritional risk  HPI: Amber [name] is a 76 y.o. 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 Weber Cooks of psychiatry. Patient was admitted to [facility] from 1/13-1/24 after presenting with failure to thrive following a several week history of complaints of difficulty swallowing with decreased oral intake and subsequent development of confusion and hallucinations.  She had outpatient work-up to include MRI brain and MRV which were unremarkable.  On admission she was found to have AKI and several electrolyte abnormalities.  While at [facility], she refused to eat and refused further evaluation with esophagram to evaluate her sensation of dysphagia.  She was evaluated by psychiatry who determined incapacity for medical decision making and an NG tube was placed for forced feeding.  Palliative care consult was also done.  Due to poor response to oral antidepressants, ECT was recommended and thus transferred to [facility] arranged. Patient tested positive for COVID on 1/13 Most of her nutritional deficiencies related to inadequate oral intake were corrected prior to transfer. On the day of discharge, patient developed acute urinary retention and a Foley catheter was placed, with development of gross hematuria due to suspected traumatic placement.    Review of Systems: As per HPI otherwise all other systems on review of systems negative.   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    Need  for electroconvulsive therapy -Psych consult to evaluate for ECT Per arrangements made prior to transfer - Continue mirtazapine via NGT    Inadequate oral nutritional intake    Nutritional deficiencies requiring NGT for force-feeding   Moderate protein-calorie malnutrition ([place])   Dysphagia of unknown etiology -Nutritionist consult for tube feeds - Continue nutritional supplementation via NG tube - Patient has refused further evaluation of dysphagia to evaluate for organic etiology    Acute urinary retention   Foley catheter in place - Continue Foley catheter --Continue tamsulosin    ABLA (acute blood loss anemia) 1/24   Gross hematuria secondary to traumatic Foley - Hemoglobin 9.2-7.4 - Continue to trend H&H and transfuse if necessary(daughter consented via telephone) --one time NS bolus ordered - CBI may be necessary - Hold anticoagulation  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 This is judged to be reasonable and necessary in order to provide the required intensity of service to ensure the patient's safety given the presenting symptoms, physical exam findings, and initial radiographic and laboratory data in the context of their  Comorbid conditions.   Patient requires inpatient status due to high intensity of service, high risk  for further deterioration and high frequency of surveillance required.   I certify  that at the point of admission it is my clinical judgment that the patient will require inpatient hospital care spanning beyond 2 midnights     Physical Exam: Vitals:   06/20/21 0054  BP: 110/61  Pulse: 100  Resp: 16  SpO2: 99%   Vitals with BMI 06/20/2021 06/19/2021 06/19/2021  Height - - -  Weight - - -  BMI - - -  Systolic 161 096 99  Diastolic 61 76 69  Pulse 045 115 80    Constitutional: Chronically ill appearance, oriented x 3 . Not in any apparent distress.  NG tube HEENT:      Head: Normocephalic and atraumatic.         Eyes: PERLA, EOMI, Conjunctivae are normal. Sclera is non-icteric.       Mouth/Throat: Mucous membranes are moist.       Neck: Supple with no signs of meningismus. Cardiovascular: Regular rate and rhythm. No murmurs, gallops, or rubs. 2+ symmetrical distal pulses are present . No JVD. No  LE edema Respiratory: Respiratory effort normal .Lungs sounds clear bilaterally. No wheezes, crackles, or rhonchi.  Gastrointestinal: Soft, non tender, non distended. Positive bowel sounds.  Genitourinary: No CVA tenderness. Musculoskeletal: Nontender with normal range of motion in all extremities. No cyanosis, or erythema of extremities. Neurologic:  Face is symmetric. Moving all extremities. No gross focal neurologic deficits . Skin: Skin is warm, dry.  No rash or ulcers Psychiatric: Mood depressed    Past Medical History:  Diagnosis Date   Anxiety    Hypertension     History reviewed. No pertinent surgical history.   reports that she has quit smoking. Her smoking use included cigarettes. She has never used smokeless tobacco. She reports that she does not currently use alcohol. She reports that she does not currently use drugs.  Allergies  Allergen Reactions   Shellfish Allergy     "I pass out"    Family History  Problem Relation Age of Onset   High blood pressure Maternal Grandmother       Prior to Admission medications   Medication Sig Start Date  End Date Taking? Authorizing Provider  acetaminophen (TYLENOL) 500 MG tablet Take 500 mg by mouth every 6 (six) hours as needed for moderate pain or mild pain.    [provider]  cyanocobalamin (,VITAMIN B-12,) 1000 MCG/ML injection Inject 1 mL (1,000 mcg total) into the muscle daily. 06/20/21   Regalado, Belkys A, MD  enoxaparin (LOVENOX) 80 MG/0.8ML injection Inject 0.65 mLs (65 mg total) into the skin every 12 (twelve) hours. 06/19/21   Regalado, Belkys A, MD  haloperidol lactate (HALDOL) 5 MG/ML injection Inject 0.1 mLs (0.5 mg total) into the vein at bedtime. 06/19/21   Regalado, Belkys A, MD  mirtazapine (REMERON) 7.5 MG tablet Place 1 tablet (7.5 mg total) into feeding tube at bedtime. 06/19/21   Regalado, Belkys A, MD  Multiple Vitamin (MULTIVITAMIN) LIQD Take 15 mLs by mouth daily. 06/20/21   Regalado, Belkys A, MD  Nutritional Supplements (FEEDING SUPPLEMENT, OSMOLITE 1.2 CAL,) LIQD Place 1,000 mLs into feeding tube continuous. 06/19/21   Regalado, Belkys A, MD  sodium bicarbonate 650 MG tablet Place 1 tablet (650 mg total) into feeding tube 2 (two) times daily. 06/19/21   Regalado, Belkys A, MD  sodium chloride 0.9 % SOLN 50 mL with thiamine 100 MG/ML SOLN 500 mg Inject 500 mg into the vein 2 (two) times  daily. 06/19/21   Regalado, Jerald Kief A, MD  tamsulosin (FLOMAX) 0.4 MG CAPS capsule Take 1 capsule (0.4 mg total) by mouth daily after supper. 06/19/21   Regalado, Belkys A, MD  thiamine 100 MG tablet Take 1 tablet (100 mg total) by mouth daily. 06/21/21   Regalado, Belkys A, MD  Vitamin D, Ergocalciferol, (DRISDOL) 1.25 MG (50000 UNIT) CAPS capsule Take 1 capsule (50,000 Units total) by mouth every 7 (seven) days. 06/22/21   Regalado, Cassie Freer, MD  Water For Irrigation, Sterile (FREE WATER) SOLN Place 100 mLs into feeding tube every 4 (four) hours. 06/19/21   Regalado, Jerald Kief A, MD      Labs on Admission: I have personally reviewed following labs and imaging studies  CBC: Recent Labs   Lab 06/15/21 0429 06/17/21 0753 06/18/21 0218 06/19/21 0638 06/19/21 1634  WBC 7.0 5.0 4.9 6.6 6.8  NEUTROABS  --  3.4 3.1 5.5  --   HGB 10.2* 8.5* 8.5* 9.2* 7.4*  HCT 31.9* 26.2* 26.0* 28.3* 23.3*  MCV 93.0 93.9 92.5 91.9 92.5  PLT 135* 123* 133* 181 166   Basic Metabolic Panel: Recent Labs  Lab 06/15/21 0429 06/17/21 0753 06/18/21 0218 06/19/21 0638 06/19/21 1634  NA 143 137 135 136  --   K 5.0 4.9 4.4 4.5  --   CL 116* 112* 107 107  --   CO2 20* 21* 21* 23  --   GLUCOSE 91 110* 126* 95  --   BUN 12 36* 34* 29*  --   CREATININE 1.21* 1.20* 1.14* 1.19*  --   CALCIUM 9.5 9.2 9.1 9.2  --   MG  --   --  1.6* 2.1 1.9  PHOS  --   --  2.2* 2.9 2.5   GFR: Estimated Creatinine Clearance: 39.4 mL/min (A) (by C-G formula based on SCr of 1.19 mg/dL (H)). Liver Function Tests: No results for input(s): AST, ALT, ALKPHOS, BILITOT, PROT, ALBUMIN in the last 168 hours. No results for input(s): LIPASE, AMYLASE in the last 168 hours. Recent Labs  Lab 06/18/21 0835  AMMONIA 13   Coagulation Profile: No results for input(s): INR, PROTIME in the last 168 hours. Cardiac Enzymes: No results for input(s): CKTOTAL, CKMB, CKMBINDEX, TROPONINI in the last 168 hours. BNP (last 3 results) No results for input(s): PROBNP in the last 8760 hours. HbA1C: No results for input(s): HGBA1C in the last 72 hours. CBG: Recent Labs  Lab 06/18/21 1156 06/18/21 2202 06/19/21 0010 06/19/21 1138 06/19/21 1622  GLUCAP 113* 109* 119* 121* 131*   Lipid Profile: No results for input(s): CHOL, HDL, LDLCALC, TRIG, CHOLHDL, LDLDIRECT in the last 72 hours. Thyroid Function Tests: No results for input(s): TSH, T4TOTAL, FREET4, T3FREE, THYROIDAB in the last 72 hours. Anemia Panel: Recent Labs    06/18/21 0835  VITAMINB12 135*   Urine analysis:    Component Value Date/Time   COLORURINE YELLOW 06/08/2021 2234   APPEARANCEUR CLEAR 06/08/2021 2234   APPEARANCEUR Cloudy (A) 03/30/2021 1431    LABSPEC 1.025 06/08/2021 2234   PHURINE 5.5 06/08/2021 2234   GLUCOSEU NEGATIVE 06/08/2021 2234   HGBUR LARGE (A) 06/08/2021 2234   BILIRUBINUR NEGATIVE 06/08/2021 2234   BILIRUBINUR Negative 03/30/2021 1431   KETONESUR NEGATIVE 06/08/2021 2234   PROTEINUR NEGATIVE 06/08/2021 2234   NITRITE NEGATIVE 06/08/2021 2234   LEUKOCYTESUR TRACE (A) 06/08/2021 2234    Radiological Exams on Admission: No results found.     Athena Masse MD [facility]   06/19/2021, 9:13  PM

## 2021-06-20 ENCOUNTER — Inpatient Hospital Stay: Payer: Medicare Other

## 2021-06-20 DIAGNOSIS — F323 Major depressive disorder, single episode, severe with psychotic features: Secondary | ICD-10-CM

## 2021-06-20 DIAGNOSIS — R338 Other retention of urine: Secondary | ICD-10-CM

## 2021-06-20 DIAGNOSIS — E210 Primary hyperparathyroidism: Secondary | ICD-10-CM

## 2021-06-20 DIAGNOSIS — U071 COVID-19: Secondary | ICD-10-CM

## 2021-06-20 DIAGNOSIS — Z7901 Long term (current) use of anticoagulants: Secondary | ICD-10-CM

## 2021-06-20 DIAGNOSIS — N1831 Chronic kidney disease, stage 3a: Secondary | ICD-10-CM

## 2021-06-20 DIAGNOSIS — Z9189 Other specified personal risk factors, not elsewhere classified: Secondary | ICD-10-CM

## 2021-06-20 DIAGNOSIS — Z978 Presence of other specified devices: Secondary | ICD-10-CM

## 2021-06-20 DIAGNOSIS — R1310 Dysphagia, unspecified: Secondary | ICD-10-CM

## 2021-06-20 DIAGNOSIS — R509 Fever, unspecified: Secondary | ICD-10-CM

## 2021-06-20 DIAGNOSIS — D62 Acute posthemorrhagic anemia: Secondary | ICD-10-CM

## 2021-06-20 DIAGNOSIS — J690 Pneumonitis due to inhalation of food and vomit: Secondary | ICD-10-CM

## 2021-06-20 LAB — CBC WITH DIFFERENTIAL/PLATELET
Abs Immature Granulocytes: 0.09 10*3/uL — ABNORMAL HIGH (ref 0.00–0.07)
Abs Immature Granulocytes: 0.08 10*3/uL — ABNORMAL HIGH (ref 0.00–0.07)
Basophils Absolute: 0 10*3/uL (ref 0.0–0.1)
Basophils Absolute: 0 10*3/uL (ref 0.0–0.1)
Basophils Relative: 0 %
Basophils Relative: 0 %
Eosinophils Absolute: 0.1 10*3/uL (ref 0.0–0.5)
Eosinophils Absolute: 0 10*3/uL (ref 0.0–0.5)
Eosinophils Relative: 1 %
Eosinophils Relative: 0 %
HCT: 21.8 % — ABNORMAL LOW (ref 36.0–46.0)
HCT: 21 % — ABNORMAL LOW (ref 36.0–46.0)
Hemoglobin: 7 g/dL — ABNORMAL LOW (ref 12.0–15.0)
Hemoglobin: 6.7 g/dL — ABNORMAL LOW (ref 12.0–15.0)
Immature Granulocytes: 1 %
Immature Granulocytes: 1 %
Lymphocytes Relative: 14 %
Lymphocytes Relative: 14 %
Lymphs Abs: 1 10*3/uL (ref 0.7–4.0)
Lymphs Abs: 0.9 10*3/uL (ref 0.7–4.0)
MCH: 30.3 pg (ref 26.0–34.0)
MCH: 29.9 pg (ref 26.0–34.0)
MCHC: 32.1 g/dL (ref 30.0–36.0)
MCHC: 31.9 g/dL (ref 30.0–36.0)
MCV: 94.4 fL (ref 80.0–100.0)
MCV: 93.8 fL (ref 80.0–100.0)
Monocytes Absolute: 0.4 10*3/uL (ref 0.1–1.0)
Monocytes Absolute: 0.4 10*3/uL (ref 0.1–1.0)
Monocytes Relative: 6 %
Monocytes Relative: 6 %
Neutro Abs: 5.4 10*3/uL (ref 1.7–7.7)
Neutro Abs: 5.1 10*3/uL (ref 1.7–7.7)
Neutrophils Relative %: 78 %
Neutrophils Relative %: 79 %
Platelets: 149 10*3/uL — ABNORMAL LOW (ref 150–400)
Platelets: 144 10*3/uL — ABNORMAL LOW (ref 150–400)
RBC: 2.31 MIL/uL — ABNORMAL LOW (ref 3.87–5.11)
RBC: 2.24 MIL/uL — ABNORMAL LOW (ref 3.87–5.11)
RDW: 15.3 % (ref 11.5–15.5)
RDW: 15.5 % (ref 11.5–15.5)
WBC: 7 10*3/uL (ref 4.0–10.5)
WBC: 6.5 10*3/uL (ref 4.0–10.5)
nRBC: 0 % (ref 0.0–0.2)
nRBC: 0 % (ref 0.0–0.2)

## 2021-06-20 LAB — COMPREHENSIVE METABOLIC PANEL
ALT: 17 U/L (ref 0–44)
ALT: 15 U/L (ref 0–44)
AST: 16 U/L (ref 15–41)
AST: 16 U/L (ref 15–41)
Albumin: 1.8 g/dL — ABNORMAL LOW (ref 3.5–5.0)
Albumin: 1.7 g/dL — ABNORMAL LOW (ref 3.5–5.0)
Alkaline Phosphatase: 48 U/L (ref 38–126)
Alkaline Phosphatase: 50 U/L (ref 38–126)
Anion gap: 5 (ref 5–15)
Anion gap: 5 (ref 5–15)
BUN: 36 mg/dL — ABNORMAL HIGH (ref 8–23)
BUN: 35 mg/dL — ABNORMAL HIGH (ref 8–23)
CO2: 22 mmol/L (ref 22–32)
CO2: 22 mmol/L (ref 22–32)
Calcium: 9 mg/dL (ref 8.9–10.3)
Calcium: 8.8 mg/dL — ABNORMAL LOW (ref 8.9–10.3)
Chloride: 110 mmol/L (ref 98–111)
Chloride: 108 mmol/L (ref 98–111)
Creatinine, Ser: 1.23 mg/dL — ABNORMAL HIGH (ref 0.44–1.00)
Creatinine, Ser: 1.19 mg/dL — ABNORMAL HIGH (ref 0.44–1.00)
GFR, Estimated: 46 mL/min — ABNORMAL LOW (ref >60)
GFR, Estimated: 48 mL/min — ABNORMAL LOW (ref >60)
Glucose, Bld: 129 mg/dL — ABNORMAL HIGH (ref 70–99)
Glucose, Bld: 110 mg/dL — ABNORMAL HIGH (ref 70–99)
Potassium: 4.2 mmol/L (ref 3.5–5.1)
Potassium: 4.1 mmol/L (ref 3.5–5.1)
Sodium: 137 mmol/L (ref 135–145)
Sodium: 135 mmol/L (ref 135–145)
Total Bilirubin: 0.4 mg/dL (ref 0.3–1.2)
Total Bilirubin: 0.5 mg/dL (ref 0.3–1.2)
Total Protein: 4.3 g/dL — ABNORMAL LOW (ref 6.5–8.1)
Total Protein: 4.1 g/dL — ABNORMAL LOW (ref 6.5–8.1)

## 2021-06-20 LAB — MAGNESIUM
Magnesium: 2 mg/dL (ref 1.7–2.4)
Magnesium: 2.1 mg/dL (ref 1.7–2.4)
Magnesium: 1.9 mg/dL (ref 1.7–2.4)

## 2021-06-20 LAB — PHOSPHORUS
Phosphorus: 2.9 mg/dL (ref 2.5–4.6)
Phosphorus: 2.9 mg/dL (ref 2.5–4.6)
Phosphorus: 2.7 mg/dL (ref 2.5–4.6)

## 2021-06-20 LAB — HEMOGLOBIN AND HEMATOCRIT, BLOOD
HCT: 21.4 % — ABNORMAL LOW (ref 36.0–46.0)
Hemoglobin: 6.9 g/dL — ABNORMAL LOW (ref 12.0–15.0)

## 2021-06-20 LAB — GLUCOSE, CAPILLARY
Glucose-Capillary: 139 mg/dL — ABNORMAL HIGH (ref 70–99)
Glucose-Capillary: 112 mg/dL — ABNORMAL HIGH (ref 70–99)
Glucose-Capillary: 120 mg/dL — ABNORMAL HIGH (ref 70–99)
Glucose-Capillary: 118 mg/dL — ABNORMAL HIGH (ref 70–99)

## 2021-06-20 MED ORDER — SODIUM CHLORIDE 0.9% IV SOLUTION: Freq: Once | INTRAVENOUS | Status: AC

## 2021-06-20 MED ORDER — SODIUM CHLORIDE 0.9 % IV BOLUS
1000.0000 mL | Freq: Once | INTRAVENOUS | Status: AC
  Administered 2021-06-20: 19:00:00 1000 mL via INTRAVENOUS

## 2021-06-20 MED ORDER — SODIUM CHLORIDE 0.9 % IV SOLN
3.0000 g | Freq: Three times a day (TID) | INTRAVENOUS | Status: DC
  Administered 2021-06-20 – 2021-06-23 (×9): 3 g via INTRAVENOUS
  Filled 2021-06-20: qty 3
  Filled 2021-06-20: qty 8
  Filled 2021-06-20 (×4): qty 3
  Filled 2021-06-20 (×4): qty 8
  Filled 2021-06-20 (×3): qty 3

## 2021-06-20 MED ORDER — SODIUM CHLORIDE 0.9 % IV BOLUS
1000.0000 mL | Freq: Once | INTRAVENOUS | Status: AC
  Administered 2021-06-20: 16:00:00 1000 mL via INTRAVENOUS

## 2021-06-20 MED ORDER — OSMOLITE 1.2 CAL PO LIQD
1000.0000 mL | ORAL | Status: AC
  Administered 2021-06-20: 11:00:00 1000 mL

## 2021-06-20 MED ORDER — OSMOLITE 1.2 CAL PO LIQD
1000.0000 mL | ORAL | Status: DC
  Administered 2021-06-20: 04:00:00 1000 mL

## 2021-06-20 MED ORDER — CHLORHEXIDINE GLUCONATE CLOTH 2 % EX PADS
6.0000 | MEDICATED_PAD | Freq: Every day | CUTANEOUS | Status: DC
  Administered 2021-06-21 – 2021-07-10 (×14): 6 via TOPICAL

## 2021-06-20 MED ORDER — ADULT MULTIVITAMIN W/MINERALS CH
1.0000 | ORAL_TABLET | Freq: Every day | ORAL | Status: DC
  Administered 2021-06-20 – 2021-06-26 (×6): 1
  Filled 2021-06-20 (×7): qty 1

## 2021-06-20 NOTE — Consult Note (Signed)
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 female patient admitted with "I really do not want to talk about it".  HPI: Patient seen chart reviewed.  76 year old woman admitted to the [name] in [place] around the 14th of this month where she had presented with complaints of progressive decline over weeks to months.  Patient had been failing to eat or take anything by mouth recently and had become less and less functional with more signs of depression at home.  Family reports patient becoming very negative giving up most of her activities of daily living expressing hopelessness and social withdrawal with passive suicidal thoughts.  Family also reports at times her appearing to be paranoid or to have hallucinations.  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  Patient has been receiving medication treatment without any improvement.  On interview the patient was unwilling to cooperate with the interview.  Grunted a few answers to a few questions but then told me she did not want to talk  about it and wanted to be left alone.  Told me she was not able to eat because she could not swallow anything.  Express just general hopelessness.  Past Psychiatric History: Patient has a past history of major depression.  Records not available except from secondhand information from the family who report previous response years ago to medication  Risk to Self:   Risk to Others:   Prior Inpatient Therapy:   Prior Outpatient Therapy:    Past Medical History:  Past Medical History:  Diagnosis Date   Anxiety    Hypertension    History reviewed. No pertinent surgical history. Family History:  Family History  Problem Relation Age of Onset   High blood pressure Maternal Grandmother    Family Psychiatric  History: Unknown Social History:  Social History   Substance and Sexual Activity  Alcohol Use Not Currently     Social History   Substance and Sexual Activity  Drug Use Not Currently    Social History   Socioeconomic History   Marital status: Married    Spouse name: Not on file   Number of children: Not on file   Years of education: Not on file   Highest education level: Not on file  Occupational History   Not on file  Tobacco Use   Smoking status: Former    Types: Cigarettes   Smokeless tobacco: Never  Vaping Use   Vaping Use: Never used  Substance and Sexual Activity   Alcohol use: Not Currently   Drug use: Not Currently   Sexual activity: Not Currently  Other Topics Concern   Not on file  Social History Narrative   Right handed    one story home    lives with family    Doesn't work currently   Social Determinants of Radio broadcast assistant Strain: Not on file  Food Insecurity: Not on file  Transportation Needs: Not on file  Physical Activity: Not on file  Stress: Not on file  Social Connections: Not on file   Additional Social History:    Allergies:   Allergies  Allergen Reactions   Shellfish Allergy     "I pass out"    Labs:  Results  for orders placed or performed during the [name] encounter of 06/19/21 (from the past 48 hour(s))  Prepare RBC (crossmatch)     Status: None   Collection Time: 06/19/21  9:36 PM  Result Value Ref Range   Order Confirmation      ORDER PROCESSED BY BLOOD BANK Performed at [facility], [address], [place], [place] [address]   Type and screen [place]     Status: None (Preliminary result)   Collection Time: 06/19/21  9:57 PM  Result Value Ref Range   ABO/RH(D) O POS    Antibody Screen NEG    Sample Expiration 06/22/2021,2359    Unit Number [id number]    Blood Component Type RBC LR PHER2    Unit division 00    Status of Unit ALLOCATED    Transfusion Status OK TO TRANSFUSE    Crossmatch Result      Compatible Performed at [facility], [address], [place], [place] [address]    Unit Number [id number]    Blood Component Type RED CELLS,LR    Unit division 00    Status of Unit ALLOCATED    Transfusion Status OK TO TRANSFUSE    Crossmatch Result Compatible   Hemoglobin and hematocrit, blood     Status: Abnormal   Collection Time: 06/19/21 10:48 PM  Result Value Ref Range   Hemoglobin 7.6 (L) 12.0 - 15.0 g/dL   HCT 23.6 (L) 36.0 - 46.0 %    Comment: Performed at [facility], [address] [place], [place] [address]  Hemoglobin and hematocrit, blood     Status: Abnormal   Collection Time: 06/20/21  4:48 AM  Result Value Ref Range   Hemoglobin 6.9 (L) 12.0 - 15.0 g/dL   HCT 21.4 (L) 36.0 - 46.0 %    Comment: Performed at [facility], [address] [place], [place] [address]  Glucose, capillary     Status: Abnormal   Collection Time: 06/20/21  8:05 AM  Result Value Ref Range   Glucose-Capillary 139 (H) 70 - 99 mg/dL    Comment: Glucose reference range applies only to samples taken after fasting for at least 8 hours.  CBC with Differential/Platelet     Status: Abnormal   Collection Time: 06/20/21   8:06 AM  Result Value Ref Range   WBC 7.0 4.0 - 10.5 K/uL   RBC 2.31 (L) 3.87 - 5.11 MIL/uL   Hemoglobin 7.0 (L) 12.0 - 15.0 g/dL   HCT 21.8 (L) 36.0 - 46.0 %   MCV 94.4 80.0 - 100.0 fL   MCH 30.3 26.0 - 34.0 pg   MCHC 32.1 30.0 - 36.0 g/dL   RDW 15.3 11.5 - 15.5 %   Platelets 149 (L) 150 - 400 K/uL   nRBC 0.0 0.0 - 0.2 %   Neutrophils Relative % 78 %   Neutro Abs 5.4 1.7 -  7.7 K/uL   Lymphocytes Relative 14 %   Lymphs Abs 1.0 0.7 - 4.0 K/uL   Monocytes Relative 6 %   Monocytes Absolute 0.4 0.1 - 1.0 K/uL   Eosinophils Relative 1 %   Eosinophils Absolute 0.1 0.0 - 0.5 K/uL   Basophils Relative 0 %   Basophils Absolute 0.0 0.0 - 0.1 K/uL   Immature Granulocytes 1 %   Abs Immature Granulocytes 0.09 (H) 0.00 - 0.07 K/uL    Comment: Performed at [name], [address], [place], [place] [address]  Magnesium     Status: None   Collection Time: 06/20/21  8:06 AM  Result Value Ref Range   Magnesium 2.0 1.7 - 2.4 mg/dL    Comment: Performed at [facility], [address], [place], [place] [address]  Phosphorus     Status: None   Collection Time: 06/20/21  8:06 AM  Result Value Ref Range   Phosphorus 2.9 2.5 - 4.6 mg/dL    Comment: Performed at [facility], [address] [place], [place] [address]  Comprehensive metabolic panel     Status: Abnormal   Collection Time: 06/20/21  8:06 AM  Result Value Ref Range   Sodium 137 135 - 145 mmol/L   Potassium 4.2 3.5 - 5.1 mmol/L   Chloride 110 98 - 111 mmol/L   CO2 22 22 - 32 mmol/L   Glucose, Bld 129 (H) 70 - 99 mg/dL    Comment: Glucose reference range applies only to samples taken after fasting for at least 8 hours.   BUN 36 (H) 8 - 23 mg/dL   Creatinine, Ser 1.23 (H) 0.44 - 1.00 mg/dL   Calcium 9.0 8.9 - 10.3 mg/dL   Total Protein 4.3 (L) 6.5 - 8.1 g/dL   Albumin 1.8 (L) 3.5 - 5.0 g/dL   AST 16 15 - 41 U/L   ALT 17 0 - 44 U/L   Alkaline Phosphatase 48 38 - 126 U/L   Total Bilirubin 0.4 0.3  - 1.2 mg/dL   GFR, Estimated 46 (L) >60 mL/min    Comment: (NOTE) Calculated using the CKD-EPI Creatinine Equation (2021)    Anion gap 5 5 - 15    Comment: Performed at [facility], [address] [place], [place] [address]  Glucose, capillary     Status: Abnormal   Collection Time: 06/20/21 11:19 AM  Result Value Ref Range   Glucose-Capillary 112 (H) 70 - 99 mg/dL    Comment: Glucose reference range applies only to samples taken after fasting for at least 8 hours.  Magnesium     Status: None   Collection Time: 06/20/21  1:14 PM  Result Value Ref Range   Magnesium 2.1 1.7 - 2.4 mg/dL    Comment: Performed at [facility], [address] [place], [place] [address]  Phosphorus     Status: None   Collection Time: 06/20/21  1:14 PM  Result Value Ref Range   Phosphorus 2.9 2.5 - 4.6 mg/dL    Comment: Performed at [facility], [address] [place],  [address]    Current Facility-Administered Medications  Medication Dose Route Frequency Provider Last Rate Last Admin   0.9 %  sodium chloride infusion (Manually program via Guardrails IV Fluids)   Intravenous Once Athena Masse, MD       acetaminophen (TYLENOL) tablet 650 mg  650 mg Oral Q6H PRN Athena Masse, MD   650 mg at 06/20/21 1556  Or   acetaminophen (TYLENOL) suppository 650 mg  650 mg Rectal Q6H PRN Athena Masse, MD       cyanocobalamin ((VITAMIN B-12)) injection 1,000 mcg  1,000 mcg Intramuscular Daily Judd Gaudier V, MD       feeding supplement (OSMOLITE 1.2 CAL) liquid 1,000 mL  1,000 mL Per Tube Continuous Raiford Noble [place], DO 65 mL/hr at 06/20/21 1129 1,000 mL at 06/20/21 1129   free water 100 mL  100 mL Per Tube Q4H Judd Gaudier V, MD   100 mL at 06/20/21 1614   haloperidol lactate (HALDOL) injection 0.5 mg  0.5 mg Intravenous QHS Judd Gaudier V, MD   0.5 mg at 06/19/21 2352   mirtazapine (REMERON) tablet 7.5 mg  7.5 mg Per Tube QHS Judd Gaudier V, MD   7.5 mg at  06/19/21 2352   multivitamin with minerals tablet 1 tablet  1 tablet Per Tube Daily Raiford Noble [place], DO   1 tablet at 06/20/21 1129   ondansetron (ZOFRAN) tablet 4 mg  4 mg Oral Q6H PRN Athena Masse, MD       Or   ondansetron [place] [name]) injection 4 mg  4 mg Intravenous Q6H PRN Athena Masse, MD       sodium bicarbonate tablet 650 mg  650 mg Per Tube BID Athena Masse, MD   650 mg at 06/20/21 2353   tamsulosin (FLOMAX) capsule 0.4 mg  0.4 mg Oral QPC supper Athena Masse, MD       thiamine 500mg  in normal saline (1ml) IVPB  500 mg Intravenous BID Athena Masse, MD 100 mL/hr at 06/20/21 0936 500 mg at 06/20/21 0936   [START ON 06/21/2021] thiamine tablet 100 mg  100 mg Oral Daily Athena Masse, MD       [START ON 06/22/2021] Vitamin D (Ergocalciferol) (DRISDOL) capsule 50,000 Units  50,000 Units Oral Q7 days Athena Masse, MD        Musculoskeletal: Strength & Muscle Tone: decreased Gait & Station: unable to stand Patient leans: N/A            Psychiatric Specialty Exam:  Presentation  General Appearance: -- (in [name] bed, had to pull covers down to examine)  Eye Contact:Minimal  Speech:Clear and Coherent  Speech Volume:Decreased  Handedness:Right   Mood and Affect  Mood:-- ("I don't want to talk")  Affect:Congruent   Thought Process  Thought Processes:-- (poverty)  Descriptions of Associations:Intact  Orientation:Full (Time, Place and Person)  Thought Content:-- (devoid of SI, HI, moderate improvement in delusions)  History of Schizophrenia/Schizoaffective disorder:No  Duration of Psychotic Symptoms:Less than six months  Hallucinations:Hallucinations: None  Ideas of Reference:None  Suicidal Thoughts:Suicidal Thoughts: No SI Passive Intent and/or Plan: Without Plan; Without Intent  Homicidal Thoughts:Homicidal Thoughts: No   Sensorium  Memory:Immediate Fair; Recent Poor; Remote  Poor  Judgment:Poor  Insight:Lacking   Executive Functions  Concentration:Poor  Attention Span:Poor  [place] of Knowledge:Poor  Language:Poor   Psychomotor Activity  Psychomotor Activity:Psychomotor Activity: Psychomotor Retardation   Assets  Assets:Social Support   Sleep  Sleep:Sleep: Fair (ie sleeping too much)   Physical Exam: Physical Exam Vitals and nursing note reviewed.  Constitutional:      Appearance: She is ill-appearing.  HENT:     Head: Normocephalic and atraumatic.     Mouth/Throat:     Pharynx: Oropharynx is clear.  Eyes:     Pupils: Pupils are equal, round, and reactive to light.  Cardiovascular:  Rate and Rhythm: Normal rate and regular rhythm.  Pulmonary:     Effort: Pulmonary effort is normal.     Breath sounds: Normal breath sounds.  Abdominal:     General: Abdomen is flat.     Palpations: Abdomen is soft.  Musculoskeletal:        General: Normal range of motion.  Skin:    General: Skin is warm and dry.  Neurological:     General: No focal deficit present.     Mental Status: Mental status is at baseline.  Psychiatric:        Attention and Perception: She is inattentive.        Mood and Affect: Mood normal. Affect is blunt and inappropriate.        Speech: She is noncommunicative. Speech is delayed.        Behavior: Behavior is withdrawn.        Cognition and Memory: Cognition is impaired.        Judgment: Judgment is inappropriate.   Review of Systems  Unable to perform ROS: Psychiatric disorder  Blood pressure (!) 100/53, pulse (!) 104, temperature 98.1 F (36.7 C), resp. rate 16, height 5\' 6"  (1.676 m), weight 66.4 kg, SpO2 98 %. Body mass index is 23.63 kg/m.  Treatment Plan Summary: Medication management and Plan 76 year old woman with severe major depression life-threatening due to failure to eat.  Unresponsive to current medication.  No specific contraindications to prevent electroconvulsive therapy.   Patient herself lacks capacity to make decisions as she is unwilling to even begin discussing treatment plan or acknowledge the nature of her problem or diagnosis.  Spoke with patient's daughter who has spoken with her own father as well.  Presented the recommendation for electroconvulsive therapy.  Reviewed the treatment plan and the risks and potential benefits of treatment.  Family is tentatively giving consent.  Plan will be for consent forms to be signed tomorrow with likely beginning of ECT bilateral treatment on Friday.  Orders will be placed as needed.  No change to immediate plan for today.  Disposition:  Electroconvulsive therapy.  Alethia Berthold, MD 06/20/2021 4:30 PM

## 2021-06-20 NOTE — Progress Notes (Signed)
Rapid Response Event Note   Reason for Call : [place] (fever)   Initial Focused Assessment: Pt lying in bed on my arrival, resting, no distress noted. Primary RN at bedside. RN states that pt has fever of 101.9 and HR is 115 which is about what it has been all day. Pt is alert and answering most questions appropriately.   Interventions: RN administered Tylenol about 5-10 minutes prior to calling rapid response.   Plan of Care: Pt will remain on 1C at this time. Will reassess pt and MEWS to see if tylenol helps with fever.    Event Summary:   MD Notified: Dr. Alfredia Ferguson Call Time: 3825 Arrival Time: 0539 End Time: [place], RN

## 2021-06-20 NOTE — Progress Notes (Signed)
Initial Nutrition Assessment  DOCUMENTATION CODES:  Severe malnutrition in context of chronic illness  INTERVENTION:  Initiate tube feeding via NGT: Osmolite 1.2 at 65 ml/h (1560 ml per day) 154ml free water Q4H  Provides 1872 kcal, 86 gm protein, 1280ml free water (1849ml total free water with flushes).  Discontinue liquid MVI and recommend crushing MVI with minerals per tube.  Monitor magnesium, potassium, and phosphorus BID for at least 3 days, MD to replete as needed, as pt is at risk for refeeding syndrome.  NUTRITION DIAGNOSIS:  Severe Malnutrition related to chronic illness (major depressive disorder) as evidenced by percent weight loss, severe fat depletion, severe muscle depletion.  GOAL:  Patient will meet greater than or equal to 90% of their needs  MONITOR:  TF tolerance, Labs, Weight trends, I & O's  REASON FOR ASSESSMENT:  Consult Enteral/tube feeding initiation and management  ASSESSMENT:  76 yo female with a PMH of HTN, CKD stage 3b, vitamin D deficiency 2/2 CKD, secondary hyperparathyroidism admitted with hypernatremia 2/2 FTT in the setting of depression/MDD. [facility] from [facility] to [facility] for consideration of ECT. 1/20 - Cortrak placed (tip at antro pyloric region of the stomach directed towards the duodenum per xray)  Pt sleeping soundly at bedside. Did not awaken to touch or voice.  Pt followed closely by RD at [facility].   Per Epic, pt has lost ~10 lbs (6.5%) in the past 2 months, which is significant and severe for the time frame.  Current TF Regimen: Osmolite 1.2 @ 100 ml/hr  Provides 2880 kcal, 133 grams protein, and 1968 ml of free water.  Of note, pt with mild RUE and mild BLE edema.  Medications: Vitamin B12, FWF 100 ml q4h, Haldol, Remeron, liquid MVI, Na Bicarb thiamine, Vitamin D 50000 units weekly, thiamine in NS via IV  Labs: reviewed; CBG 109-139 (H)  NUTRITION - FOCUSED PHYSICAL EXAM: Flowsheet Row Most Recent Value  Orbital Region  Severe depletion  Upper Arm Region Severe depletion  Thoracic and Lumbar Region Severe depletion  Buccal Region Severe depletion  Temple Region Severe depletion  Clavicle Bone Region Severe depletion  Clavicle and Acromion Bone Region Severe depletion  Scapular Bone Region Severe depletion  Dorsal Hand Severe depletion  Patellar Region Severe depletion  Anterior Thigh Region Severe depletion  Posterior Calf Region Severe depletion  Edema (RD Assessment) None  Hair Reviewed  Eyes Reviewed  Mouth Reviewed  Skin Reviewed  Nails Reviewed   Diet Order:   Diet Order     None      EDUCATION NEEDS:  Not appropriate for education at this time  Skin:  Skin Assessment: Reviewed RN Assessment  Last BM:  06/19/21 - Type 6, small  Height:  Ht Readings from Last 1 Encounters:  06/20/21 5\' 6"  (1.676 m)   Weight:  Wt Readings from Last 1 Encounters:  06/20/21 66.4 kg   BMI:  Body mass index is 23.63 kg/m.  Estimated Nutritional Needs:  Kcal:  0165-5374 Protein:  85-100 grams Fluid:  >1.75 L  Derrel Nip, RD, LDN (she/her/hers) Clinical Inpatient Dietitian RD Pager/After-Hours/Weekend Pager # in [place]

## 2021-06-20 NOTE — Progress Notes (Signed)
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 Weber Cooks of psychiatry.  She was admitted to [facility] from 06/08/21-06/19/21 after presenting with failure to thrive following a several week history of complaints of difficulty swallowing with decreased oral intake and subsequent development of confusion and hallucinations.  She had outpatient work-up include brain MRI as well as MRV which was unremarkable.  On admission she is found to have an AKI and several electrolyte abnormalities.  While at [facility] she refused to eat and refused further evaluation with esophagram to evaluate her sensation of dysphagia.  She is evaluated by psychiatry who determined her to have capacity for medical decision-making and so an NG tube was placed for force-feeding.  Palliative care consult was also done.  Due to her poor response to oral antidepressants ECG was recommended and thus arranged to transfer to [facility].  On 06/08/2020 she tested positive for COVID and she received 3 days of remdesivir.  Most of her nutritional deficiencies related to inadequate oral intake were corrected prior to transfer and on the day of discharge she developed acute urinary tension a Foley catheter was placed and it was traumatic insertion with gross development of gross hematuria.  Hemoglobin did drop to 7.0 and repeat is pending.  This afternoon she spiked a temperature of 101.9 and became tachycardic and slightly hypotensive.  She is given IV fluid bolus and she is pancultured.  Repeat labs are still pending.  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[place]) Active Problems:   Moderate protein-calorie malnutrition ([place])   Primary hyperparathyroidism ([place])   Dysphagia   COVID-19 virus infection 06/08/2021   Nasogastric tube present   Chronic deep vein thrombosis (DVT) ([facility])   Chronic anticoagulation   ABLA (acute blood loss anemia)   Acute urinary retention   Foley catheter in place   Stage 3a chronic kidney disease ([place])   At high risk for inadequate nutritional intake   Gross hematuria  Psychotic depression placing patient at high nutritional risk  Need for electroconvulsive therapy -Psych consult to evaluate for ECT Per arrangements made prior to transfer -Continue mirtazapine 7.5 mg per tube via NGT -Further care per psychiatry   Inadequate oral nutritional intake  Nutritional deficiencies requiring NGT for force-feeding Moderate protein-calorie malnutrition ([place]) Dysphagia of unknown etiology -Nutritionist consult for tube feeds - Continue nutritional supplementation via NG tube - Patient has refused further evaluation of dysphagia to evaluate for organic etiology -She does not have the capacity so we will need to get SLP involved again  SIRS with concern for sepsis unclear etiology -Became febrile and tachycardic this afternoon as well as hypotensive slightly; likely could have aspirated with her NG tube in place -Initiate IV Unasyn -Given IV fluid boluses 2 L and we will panculture and obtain blood cultures x2, urinalysis and urine culture as well as chest x-ray -Chest x-ray done and showed "There is new patchy infiltrate in right lower lung fields suggesting atelectasis/pneumonia. There are transverse linear densities in the left parahilar region suggesting scarring or subsegmental atelectasis or lung nodules." -We will  order flutter valve and incentive spirometer  Thrombocytopenia -Patient's platelet count went from 181 and trended down to 144 -Has been bleeding as her Foley catheter -Continue  monitor for signs and symptoms bleeding; currently her Foley has some darker red blood in it but this is improving -Repeat CBC in the a.m.   Acute urinary retention Foley catheter in place -Continue Foley catheter --Continue tamsulosin   ABLA (acute blood loss anemia) 1/24 Gross hematuria secondary to traumatic Foley -Hemoglobin continued to drop and trended down from 9.2/28.3 is now 6.7/21.0 -We will type and screen and transfuse 1 unit PRBCs -Continued to trend H&H and transfuse if necessary(daughter consented via telephone) --one time NS bolus ordered for another few boluses as above - CBI may be necessary - Hold anticoagulation for now  Chronic anticoagulation due to chronic distal femoral DVT -Holding Eliquis and Lovenox at this time given her hemoglobin drop -SCDs for DVT prophylaxis -Resume systemic anticoagulation as appropriate   COVID-19 virus infection 06/08/2021 - Not symptomatic -Completed remdesivir for 3-day -We will treat her aspiration pneumonia as above   Stage 3a chronic kidney disease ([place]) Primary hyperparathyroidism/vitamin D deficiency - Continue supplementation via NG tube  Severe malnutrition related to chronic illness -Evidence by percent weight loss, severe fat depletion and severe muscle depletion -Nutritionist consulted for further evaluation recommendations and recommended initiating tube feedings via NG tube Osmolite 1.2 at 65 MLS per hour with 100 mL of free water flushes every 4 hours and her liquid multivitamin was discontinued and they recommended crushing multivitamin with minerals per tube daily  DVT prophylaxis: SCDs Code Status: DO NOT RESUSCITATE Family Communication: No family currently at bedside Disposition Plan: Pending further clinical improvement and clearance by psychiatry  Status is: Inpatient  Remains inpatient appropriate because: She is spiking temperatures and will be getting ECT on Friday    Consultants:  Psychiatry    Procedures: None  Antimicrobials: Will initiate IV Unasyn Anti-infectives (From admission, onward)    None        Subjective: Seen and examined at bedside and was lethargic.  Complained of some slight abdominal discomfort.  No nausea or vomiting.  Denies any chest pain or shortness of breath.  Spiked a temperature this afternoon of 101.9 and became tachycardic.  Chest x-ray reveals that she is right lower lobe pneumonia and likely aspirated.  Will initiate antibiotics.  She is very withdrawn and very depressed appearing and does not really want to participate in as well as for sleep.  Objective: Vitals:   06/20/21 0054 06/20/21 0512 06/20/21 0513 06/20/21 0513  BP: 110/61   96/62  Pulse: 100   100  Resp: 16   17  Temp:    98.6 F (37 C)  TempSrc:    Oral  SpO2: 99%   100%  Weight:  66.4 kg 66.4 kg   Height:  5\' 6"  (1.676 m) 5\' 6"  (1.676 m)     Intake/Output Summary (Last 24 hours) at 06/20/2021 0745 Last data filed at 06/20/2021 0524 Gross per 24 hour  Intake --  Output 450 ml  Net -450 ml   Filed Weights   06/20/21 0512 06/20/21 0513  Weight: 66.4 kg 66.4 kg   Examination: Physical Exam:  Constitutional: WN/WD Caucasian female currently in no acute distress appears calm but slightly uncomfortable  Eyes: Lids and conjunctivae normal, sclerae anicteric  ENMT: External Ears, Nose appear normal. Grossly normal hearing.  Has a Cortrak in place Neck: Appears normal, supple, no cervical masses, normal  ROM, no appreciable thyromegaly; no appreciable JVD Respiratory: Diminished to auscultation bilaterally, no wheezing, rales, rhonchi or crackles. Normal respiratory effort and patient is not tachypenic. No accessory muscle use.  Unlabored breathing mild 1+ Cardiovascular: RRR, no murmurs / rubs / gallops. S1 and S2 auscultated.  Mild 1+ extremity edema Abdomen: Soft, non-tender, non-distended.  Bowel sounds positive.  GU: Deferred. Musculoskeletal: No clubbing / cyanosis of  digits/nails. No joint deformity upper and lower extremities. Skin: No rashes, lesions, ulcers on limited skin evaluation. No induration; Warm and dry.  Neurologic: CN 2-12 grossly intact with no focal deficits. Romberg sign and cerebellar reflexes not assessed.  Psychiatric: Impaired judgment and insight. Alert and awake.  Severely depressed and withdrawn mood and appropriate affect.   Data Reviewed: I have personally reviewed following labs and imaging studies  CBC: Recent Labs  Lab 06/15/21 0429 06/17/21 0753 06/18/21 0218 06/19/21 0638 06/19/21 1634 06/19/21 2248 06/20/21 0448  WBC 7.0 5.0 4.9 6.6 6.8  --   --   NEUTROABS  --  3.4 3.1 5.5  --   --   --   HGB 10.2* 8.5* 8.5* 9.2* 7.4* 7.6* 6.9*  HCT 31.9* 26.2* 26.0* 28.3* 23.3* 23.6* 21.4*  MCV 93.0 93.9 92.5 91.9 92.5  --   --   PLT 135* 123* 133* 181 163  --   --    Basic Metabolic Panel: Recent Labs  Lab 06/15/21 0429 06/17/21 0753 06/18/21 0218 06/19/21 0638 06/19/21 1634  NA 143 137 135 136  --   K 5.0 4.9 4.4 4.5  --   CL 116* 112* 107 107  --   CO2 20* 21* 21* 23  --   GLUCOSE 91 110* 126* 95  --   BUN 12 36* 34* 29*  --   CREATININE 1.21* 1.20* 1.14* 1.19*  --   CALCIUM 9.5 9.2 9.1 9.2  --   MG  --   --  1.6* 2.1 1.9  PHOS  --   --  2.2* 2.9 2.5   GFR: Estimated Creatinine Clearance: 39.4 mL/min (A) (by C-G formula based on SCr of 1.19 mg/dL (H)). Liver Function Tests: No results for input(s): AST, ALT, ALKPHOS, BILITOT, PROT, ALBUMIN in the last 168 hours. No results for input(s): LIPASE, AMYLASE in the last 168 hours. Recent Labs  Lab 06/18/21 0835  AMMONIA 13   Coagulation Profile: No results for input(s): INR, PROTIME in the last 168 hours. Cardiac Enzymes: No results for input(s): CKTOTAL, CKMB, CKMBINDEX, TROPONINI in the last 168 hours. BNP (last 3 results) No results for input(s): PROBNP in the last 8760 hours. HbA1C: No results for input(s): HGBA1C in the last 72 hours. CBG: Recent  Labs  Lab 06/18/21 1156 06/18/21 2202 06/19/21 0010 06/19/21 1138 06/19/21 1622  GLUCAP 113* 109* 119* 121* 131*   Lipid Profile: No results for input(s): CHOL, HDL, LDLCALC, TRIG, CHOLHDL, LDLDIRECT in the last 72 hours. Thyroid Function Tests: No results for input(s): TSH, T4TOTAL, FREET4, T3FREE, THYROIDAB in the last 72 hours. Anemia Panel: Recent Labs    06/18/21 0835  VITAMINB12 135*   Sepsis Labs: No results for input(s): PROCALCITON, LATICACIDVEN in the last 168 hours.  No results found for this or any previous visit (from the past 240 hour(s)).   RN Pressure Injury Documentation:     Estimated body mass index is 23.63 kg/m as calculated from the following:   Height as of this encounter: 5\' 6"  (1.676 m).   Weight as of this encounter:  66.4 kg.  Malnutrition Type:   Malnutrition Characteristics:   Nutrition Interventions:   Radiology Studies: No results found.  Scheduled Meds:  sodium chloride   Intravenous Once   cyanocobalamin  1,000 mcg Intramuscular Daily   free water  100 mL Per Tube Q4H   haloperidol lactate  0.5 mg Intravenous QHS   mirtazapine  7.5 mg Per Tube QHS   multivitamin  15 mL Oral Daily   sodium bicarbonate  650 mg Per Tube BID   tamsulosin  0.4 mg Oral QPC supper   [START ON 06/21/2021] thiamine  100 mg Oral Daily   [START ON 06/22/2021] Vitamin D (Ergocalciferol)  50,000 Units Oral Q7 days   Continuous Infusions:  feeding supplement (OSMOLITE 1.2 CAL) 1,000 mL (06/20/21 0359)   thiamine 500mg  in normal saline (82ml) IVPB 500 mg (06/19/21 2351)     LOS: 1 day   Kerney Elbe, DO [facility] PAGER is on [place]  If 7PM-7AM, please contact night-coverage [email or web address]

## 2021-06-20 NOTE — Progress Notes (Signed)
°   06/20/21 1552  Assess: if the MEWS score is Yellow or Red  Were vital signs taken at a resting state? Yes  Focused Assessment No change from prior assessment  Treat  Pain Scale 0-10  Pain Score 0  Take Vital Signs  Increase Vital Sign Frequency  Red: Q 1hr X 4 then Q 4hr X 4, if remains red, continue Q 4hrs  Escalate  MEWS: Escalate Red: discuss with charge nurse/RN and provider, consider discussing with RRT  Notify: Charge Nurse/RN  Name of Charge Nurse/RN Notified Teacher, English as a foreign language  Notify: Provider  Provider Name/Title Dr Alfredia Ferguson  Date Provider Notified 06/20/21  Time Provider Notified 1600  Notification Type Call  Notification Reason Other (Comment) (Red MEWS)  Notify: Rapid Response  Date Rapid Response Notified 06/20/21  Time Rapid Response Notified 1600  Document  Patient Outcome Not stable and remains on department   Provider aware, orders are placed. RRT notified and came to bedside.

## 2021-06-20 NOTE — Progress Notes (Addendum)
Pharmacy Antibiotic Note  Amber [name] is a 76 y.o. female admitted with failure to thrive. 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 Admitted with nutritional deficiencies, resulting in need for NG tube placement. On 06/19/2021, concern for aspiration pneumonia.  Pharmacy has been consulted for ampicillin/sulbactam dosing.  Patient became febrile and tachycardic 1/25 PM. Chest x-ray shows new patchy infiltrate in right lower lung fields suggesting atelectasis/pneumonia.   Currently on RA, tachycardic and hypotensive. Scr consistent with BL ~1.2.  Plan: Ampicillin/sulbactam 3 grams every 8 hours.  Monitor clinical course, LOT.  Height: 5\' 6"  (167.6 cm) Weight: 66.4 kg (146 lb 6.2 oz) IBW/kg (Calculated) : 59.3  Temp (24hrs), Avg:99 F (37.2 C), Min:98.1 F (36.7 C), Max:101.9 F (38.8 C)  Recent Labs  Lab 06/17/21 0753 06/18/21 0218 06/19/21 0638 06/19/21 1634 06/20/21 0806 06/20/21 1643  WBC 5.0 4.9 6.6 6.8 7.0 6.5  CREATININE 1.20* 1.14* 1.19*  --  1.23* 1.19*    Estimated Creatinine Clearance: 39.4 mL/min (A) (by C-G formula based on SCr of 1.19 mg/dL (H)).    Allergies  Allergen Reactions   Shellfish Allergy     "I pass out"    Antimicrobials this admission: Ampicillin/sulbactam 1/25 >>   Dose adjustments this admission: None  Microbiology results: 1/25 BCx: in process  Thank you for allowing pharmacy to be a part of this patients care.  Wynelle Cleveland, PharmD Pharmacy Resident  06/20/2021 6:00 PM

## 2021-06-21 ENCOUNTER — Other Ambulatory Visit: Payer: Self-pay | Admitting: Psychiatry

## 2021-06-21 DIAGNOSIS — J690 Pneumonitis due to inhalation of food and vomit: Secondary | ICD-10-CM

## 2021-06-21 DIAGNOSIS — R509 Fever, unspecified: Secondary | ICD-10-CM

## 2021-06-21 DIAGNOSIS — R310 Gross hematuria: Secondary | ICD-10-CM

## 2021-06-21 LAB — COMPREHENSIVE METABOLIC PANEL
ALT: 18 U/L (ref 0–44)
AST: 19 U/L (ref 15–41)
Albumin: 1.7 g/dL — ABNORMAL LOW (ref 3.5–5.0)
Alkaline Phosphatase: 53 U/L (ref 38–126)
Anion gap: 4 — ABNORMAL LOW (ref 5–15)
BUN: 29 mg/dL — ABNORMAL HIGH (ref 8–23)
CO2: 23 mmol/L (ref 22–32)
Calcium: 8.6 mg/dL — ABNORMAL LOW (ref 8.9–10.3)
Chloride: 111 mmol/L (ref 98–111)
Creatinine, Ser: 1.06 mg/dL — ABNORMAL HIGH (ref 0.44–1.00)
GFR, Estimated: 55 mL/min — ABNORMAL LOW (ref >60)
Glucose, Bld: 101 mg/dL — ABNORMAL HIGH (ref 70–99)
Potassium: 4.1 mmol/L (ref 3.5–5.1)
Sodium: 138 mmol/L (ref 135–145)
Total Bilirubin: 0.5 mg/dL (ref 0.3–1.2)
Total Protein: 4.1 g/dL — ABNORMAL LOW (ref 6.5–8.1)

## 2021-06-21 LAB — MAGNESIUM
Magnesium: 1.8 mg/dL (ref 1.7–2.4)
Magnesium: 1.8 mg/dL (ref 1.7–2.4)
Magnesium: 1.8 mg/dL (ref 1.7–2.4)

## 2021-06-21 LAB — URINALYSIS, COMPLETE (UACMP) WITH MICROSCOPIC
Bilirubin Urine: NEGATIVE
Glucose, UA: NEGATIVE mg/dL
Ketones, ur: NEGATIVE mg/dL
Nitrite: NEGATIVE
Protein, ur: 30 mg/dL — AB
Specific Gravity, Urine: 1.015 (ref 1.005–1.030)
Squamous Epithelial / HPF: NONE SEEN (ref 0–5)
WBC, UA: >50 WBC/hpf (ref 0–5)
pH: 7 (ref 5.0–8.0)

## 2021-06-21 LAB — CBC WITH DIFFERENTIAL/PLATELET
Abs Immature Granulocytes: 0.09 10*3/uL — ABNORMAL HIGH (ref 0.00–0.07)
Basophils Absolute: 0 10*3/uL (ref 0.0–0.1)
Basophils Relative: 0 %
Eosinophils Absolute: 0.1 10*3/uL (ref 0.0–0.5)
Eosinophils Relative: 1 %
HCT: 24.1 % — ABNORMAL LOW (ref 36.0–46.0)
Hemoglobin: 8 g/dL — ABNORMAL LOW (ref 12.0–15.0)
Immature Granulocytes: 1 %
Lymphocytes Relative: 19 %
Lymphs Abs: 1.3 10*3/uL (ref 0.7–4.0)
MCH: 30.8 pg (ref 26.0–34.0)
MCHC: 33.2 g/dL (ref 30.0–36.0)
MCV: 92.7 fL (ref 80.0–100.0)
Monocytes Absolute: 0.3 10*3/uL (ref 0.1–1.0)
Monocytes Relative: 5 %
Neutro Abs: 4.8 10*3/uL (ref 1.7–7.7)
Neutrophils Relative %: 74 %
Platelets: 132 10*3/uL — ABNORMAL LOW (ref 150–400)
RBC: 2.6 MIL/uL — ABNORMAL LOW (ref 3.87–5.11)
RDW: 15.4 % (ref 11.5–15.5)
WBC: 6.6 10*3/uL (ref 4.0–10.5)
nRBC: 0 % (ref 0.0–0.2)

## 2021-06-21 LAB — PREPARE RBC (CROSSMATCH)

## 2021-06-21 LAB — HEMOGLOBIN AND HEMATOCRIT, BLOOD
HCT: 25.4 % — ABNORMAL LOW (ref 36.0–46.0)
Hemoglobin: 8.2 g/dL — ABNORMAL LOW (ref 12.0–15.0)

## 2021-06-21 LAB — GLUCOSE, CAPILLARY
Glucose-Capillary: 100 mg/dL — ABNORMAL HIGH (ref 70–99)
Glucose-Capillary: 108 mg/dL — ABNORMAL HIGH (ref 70–99)
Glucose-Capillary: 109 mg/dL — ABNORMAL HIGH (ref 70–99)
Glucose-Capillary: 111 mg/dL — ABNORMAL HIGH (ref 70–99)
Glucose-Capillary: 112 mg/dL — ABNORMAL HIGH (ref 70–99)

## 2021-06-21 LAB — PHOSPHORUS
Phosphorus: 2.5 mg/dL (ref 2.5–4.6)
Phosphorus: 2.6 mg/dL (ref 2.5–4.6)
Phosphorus: 2.7 mg/dL (ref 2.5–4.6)

## 2021-06-21 LAB — C DIFFICILE QUICK SCREEN W PCR REFLEX
C Diff antigen: POSITIVE — AB
C Diff interpretation: DETECTED
C Diff toxin: POSITIVE — AB

## 2021-06-21 MED ORDER — APIXABAN 5 MG PO TABS
5.0000 mg | ORAL_TABLET | Freq: Two times a day (BID) | ORAL | Status: DC
  Administered 2021-06-21 – 2021-07-17 (×45): 5 mg via ORAL
  Filled 2021-06-21 (×47): qty 1

## 2021-06-21 NOTE — Progress Notes (Addendum)
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 Weber Cooks of psychiatry.  She was admitted to [facility] from 06/08/21-06/19/21 after presenting with failure to thrive following a several week history of complaints of difficulty swallowing with decreased oral intake and subsequent development of confusion and hallucinations.  She had outpatient work-up include brain MRI as well as MRV which was unremarkable.  On admission she is found to have an AKI and several electrolyte abnormalities.  While at [facility] she refused to eat and refused further evaluation with esophagram to evaluate her sensation of dysphagia.  She is evaluated by psychiatry who determined her to have capacity for medical decision-making and so an NG tube was placed for force-feeding.  Palliative care consult was also done.  Due to her poor response to oral antidepressants ECG was recommended and thus arranged to transfer to [facility].  On 06/08/2020 she tested positive for COVID and she received 3 days of remdesivir.  Most of her nutritional deficiencies related to inadequate oral intake were corrected prior to transfer and on the day of discharge she developed acute urinary tension a Foley catheter was placed and it was traumatic insertion with gross development of gross hematuria.  Hemoglobin did drop to 7.0 and repeat is pending.   On the afternoon of 06/21/21 she spiked a temperature of 101.9 and became tachycardic and slightly hypotensive.  She is given IV fluid bolus and she is pancultured.  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[place]) Active Problems:   Moderate protein-calorie malnutrition ([place])   Primary hyperparathyroidism ([place])   Dysphagia   COVID-19 virus infection 06/08/2021   Nasogastric tube present   Chronic deep vein thrombosis (DVT) (HCC)   Chronic anticoagulation   ABLA (acute blood loss anemia)   Acute urinary retention   Foley catheter in place   Stage 3a chronic kidney disease (HCC)   At high risk for inadequate nutritional intake   Gross hematuria  Psychotic depression placing patient at high nutritional risk  Need for electroconvulsive therapy -Psych consult to evaluate for ECT Per arrangements made prior to transfer -Continue mirtazapine 7.5 mg per tube via NGT -Further care per psychiatry and they are planning ECT for 06/22/21   Inadequate oral nutritional intake  Nutritional deficiencies requiring NGT for force-feeding Moderate protein-calorie malnutrition ([place]) Dysphagia of unknown etiology -Nutritionist consult for tube feeds - Continue nutritional supplementation via NG tube - Patient has refused further evaluation of dysphagia to evaluate for organic etiology -She does not have the capacity so we will need to get SLP involved again  Sepsis 2/2 Aspiration PNA  -Became febrile and tachycardic this afternoon as well as hypotensive slightly; likely could have aspirated with her NG tube in place -Initiate IV Unasyn -Given IV fluid boluses 2 L and we will panculture and obtain blood cultures x2, urinalysis and urine culture as well as chest x-ray -Chest x-ray done and showed "There is new patchy infiltrate in right lower lung fields  suggesting atelectasis/pneumonia. There are transverse linear densities in the left parahilar region suggesting scarring or subsegmental atelectasis or lung nodules." -We will order flutter valve and incentive spirometer -C. difficile was checked and  she is positive for toxin producing C. difficile however given that she does not have a white count we will not currently treat as we believe this may be a colonization -Remains significantly withdrawn but not complaining of any shortness of breath  Thrombocytopenia -Patient's platelet count went from 181 and trended down to 144 yesterday and is now 132 -Has been bleeding as her Foley catheter -Continue monitor for signs and symptoms bleeding; currently her Foley has some darker red blood in it but this is improving -Repeat CBC in the a.m.   Acute urinary retention Foley catheter in place -Continue Foley catheter for now and do trial of void soon --Continue Tamsulosin   ABLA (acute blood loss anemia) 1/24 Gross hematuria secondary to traumatic Foley -Hemoglobin continued to drop and trended down from 9.2/28.3 is now 6.7/21.0 yesterday so she was typed and screened and transfused 2 units of PRBCs and went to 8.2/25.4 now is relatively stable at 8.0/24.1 with no other signs of bleeding -We will type and screen and transfuse 1 unit PRBCs -Continued to trend H&H and transfuse if necessary(daughter consented via telephone) --one time NS bolus ordered for another few boluses as above - CBI may be necessary - We will resume anticoagulation with Eliquis today  Chronic anticoagulation due to chronic distal femoral DVT - Initially was holding Eliquis and Lovenox at this time given her hemoglobin drop -SCDs for DVT prophylaxis -Resume systemic anticoagulation as appropriate and will resume Eliquis   COVID-19 virus infection 06/08/2021 - Not symptomatic -Completed remdesivir for 3-day -We will treat her aspiration pneumonia as above   Stage 3a chronic kidney disease ([place]) Primary hyperparathyroidism/vitamin D deficiency - Continue supplementation via NG tube -Patient's BUNs/creatinine went from 34/1.14 -> 29/1.19 -> 36/1.23 -> 35/1.19 -> 29/1.06 -Avoid further nephrotoxic medications,  contrast dyes, hypotension and dehydration and renally dose medications -Repeat CMP in the AM   Severe malnutrition related to chronic illness -Evidence by percent weight loss, severe fat depletion and severe muscle depletion -Nutritionist consulted for further evaluation recommendations and recommended initiating tube feedings via NG tube Osmolite 1.2 at 65 MLS per hour with 100 mL of free water flushes every 4 hours and her liquid multivitamin was discontinued and they recommended crushing multivitamin with minerals per tube daily  DVT prophylaxis: SCDs Code Status: DO NOT RESUSCITATE Family Communication: No family currently at bedside Disposition Plan: Pending further clinical improvement and clearance by psychiatry  Status is: Inpatient  Remains inpatient appropriate because: She is spiking temperatures and will be getting ECT on Friday    Consultants:  Psychiatry   Procedures: None  Antimicrobials:  Anti-infectives (From admission, onward)    Start     Dose/Rate Route Frequency Ordered Stop   06/20/21 1900  Ampicillin-Sulbactam (UNASYN) 3 g in sodium chloride 0.9 % 100 mL IVPB        3 g 200 mL/hr over 30 Minutes Intravenous Every 8 hours 06/20/21 1812          Subjective: Seen and examined at bedside and was extremely withdrawn and would not even turning face.  Interactive.  Wanted to rest.  Denied any complaints and denies shortness of breath.  No other concerns or complaints this time and will be left alone.  No other concerns or complaints at this time.  Objective:  Vitals:   06/20/21 2308 06/21/21 0112 06/21/21 0159 06/21/21 0410  BP: 124/75  132/72 127/62  Pulse: (!) 117  (!) 115 (!) 109  Resp: 18  18 18   Temp: 99.1 F (37.3 C)  99.5 F (37.5 C) 100.3 F (37.9 C)  TempSrc: Oral  Oral Oral  SpO2: 100%  98%   Weight:  72.7 kg    Height:        Intake/Output Summary (Last 24 hours) at 06/21/2021 1633 Last data filed at 06/21/2021 1500 Gross per 24 hour   Intake 6857.02 ml  Output 600 ml  Net 6257.02 ml    Filed Weights   06/20/21 0512 06/20/21 0513 06/21/21 0112  Weight: 66.4 kg 66.4 kg 72.7 kg   Examination: Physical Exam:  Constitutional: WN/WD Caucasian female who is currently extremely withdrawn  Eyes: Did not look at me and remained withdrawn ENMT: External Ears, Nose appear normal. Grossly normal hearing. Mucous membranes are moist. Has a Cortrak Neck: Appears normal, supple, no cervical masses, normal ROM, no appreciable thyromegaly Respiratory: Diminished to auscultation bilaterally with coarse breath sounds worse on the right with some rhonchi compared to left, no wheezing, rales, or crackles. Normal respiratory effort and patient is not tachypenic. No accessory muscle use.  Not wearing supplemental oxygen via nasal cannula Cardiovascular: RRR, no murmurs / rubs / gallops. S1 and S2 auscultated.  Mild 1+ lower extremity edema.  Abdomen: Soft, non-tender, non-distended. Bowel sounds positive.  GU: Deferred. Musculoskeletal: No clubbing / cyanosis of digits/nails. No joint deformity upper and lower extremities.  Skin: No rashes, lesions, ulcers on limited skin evaluation. No induration; Warm and dry.  Neurologic: CN 2-12 grossly intact with no focal deficits. Romberg sign and cerebellar reflexes not assessed.  Psychiatric: Impaired judgment and insight. Extremely withdrawn and severely depressed   Data Reviewed: I have personally reviewed following labs and imaging studies  CBC: Recent Labs  Lab 06/18/21 0218 06/19/21 3976 06/19/21 1634 06/19/21 2248 06/20/21 0448 06/20/21 0806 06/20/21 1643 06/21/21 0242 06/21/21 0940  WBC 4.9 6.6 6.8  --   --  7.0 6.5  --  6.6  NEUTROABS 3.1 5.5  --   --   --  5.4 5.1  --  4.8  HGB 8.5* 9.2* 7.4*   < > 6.9* 7.0* 6.7* 8.2* 8.0*  HCT 26.0* 28.3* 23.3*   < > 21.4* 21.8* 21.0* 25.4* 24.1*  MCV 92.5 91.9 92.5  --   --  94.4 93.8  --  92.7  PLT 133* 181 163  --   --  149* 144*  --   132*   < > = values in this interval not displayed.    Basic Metabolic Panel: Recent Labs  Lab 06/18/21 0218 06/19/21 0638 06/19/21 1634 06/20/21 0806 06/20/21 1314 06/20/21 1643 06/21/21 0242 06/21/21 0940  NA 135 136  --  137  --  135  --  138  K 4.4 4.5  --  4.2  --  4.1  --  4.1  CL 107 107  --  110  --  108  --  111  CO2 21* 23  --  22  --  22  --  23  GLUCOSE 126* 95  --  129*  --  110*  --  101*  BUN 34* 29*  --  36*  --  35*  --  29*  CREATININE 1.14* 1.19*  --  1.23*  --  1.19*  --  1.06*  CALCIUM 9.1 9.2  --  9.0  --  8.8*  --  8.6*  MG 1.6* 2.1   < > 2.0 2.1 1.9 1.8 1.8  PHOS 2.2* 2.9   < > 2.9 2.9 2.7 2.5 2.6   < > = values in this interval not displayed.    GFR: Estimated Creatinine Clearance: 48.3 mL/min (A) (by C-G formula based on SCr of 1.06 mg/dL (H)). Liver Function Tests: Recent Labs  Lab 06/20/21 0806 06/20/21 1643 06/21/21 0940  AST 16 16 19   ALT 17 15 18   ALKPHOS 48 50 53  BILITOT 0.4 0.5 0.5  PROT 4.3* 4.1* 4.1*  ALBUMIN 1.8* 1.7* 1.7*   No results for input(s): LIPASE, AMYLASE in the last 168 hours. Recent Labs  Lab 06/18/21 0835  AMMONIA 13    Coagulation Profile: No results for input(s): INR, PROTIME in the last 168 hours. Cardiac Enzymes: No results for input(s): CKTOTAL, CKMB, CKMBINDEX, TROPONINI in the last 168 hours. BNP (last 3 results) No results for input(s): PROBNP in the last 8760 hours. HbA1C: No results for input(s): HGBA1C in the last 72 hours. CBG: Recent Labs  Lab 06/20/21 1954 06/21/21 0017 06/21/21 0403 06/21/21 0830 06/21/21 1237  GLUCAP 118* 111* 108* 100* 109*    Lipid Profile: No results for input(s): CHOL, HDL, LDLCALC, TRIG, CHOLHDL, LDLDIRECT in the last 72 hours. Thyroid Function Tests: No results for input(s): TSH, T4TOTAL, FREET4, T3FREE, THYROIDAB in the last 72 hours. Anemia Panel: No results for input(s): VITAMINB12, FOLATE, FERRITIN, TIBC, IRON, RETICCTPCT in the last 72  hours.  Sepsis Labs: No results for input(s): PROCALCITON, LATICACIDVEN in the last 168 hours.  Recent Results (from the past 240 hour(s))  CULTURE, BLOOD (ROUTINE X 2) w Reflex to ID Panel     Status: None (Preliminary result)   Collection Time: 06/20/21  4:42 PM   Specimen: BLOOD RIGHT ARM  Result Value Ref Range Status   Specimen Description BLOOD RIGHT ARM  Final   Special Requests   Final    BOTTLES DRAWN AEROBIC AND ANAEROBIC Blood Culture adequate volume   Culture   Final    NO GROWTH < 24 HOURS Performed at [facility], [address] [place], [place] [address]    Report Status PENDING  Incomplete  CULTURE, BLOOD (ROUTINE X 2) w Reflex to ID Panel     Status: None (Preliminary result)   Collection Time: 06/20/21  4:53 PM   Specimen: BLOOD RIGHT HAND  Result Value Ref Range Status   Specimen Description BLOOD RIGHT HAND  Final   Special Requests   Final    BOTTLES DRAWN AEROBIC AND ANAEROBIC Blood Culture adequate volume   Culture   Final    NO GROWTH < 24 HOURS Performed at [facility], [address] [place], [place] [address]    Report Status PENDING  Incomplete  C Difficile Quick Screen w PCR reflex     Status: Abnormal   Collection Time: 06/21/21  1:48 PM   Specimen: STOOL  Result Value Ref Range Status   C Diff antigen POSITIVE (A) NEGATIVE Final   C Diff toxin POSITIVE (A) NEGATIVE Final   C Diff interpretation Toxin producing C. difficile detected.  Final    Comment: CRITICAL RESULT CALLED TO, READ BACK BY AND VERIFIED WITH: [name] HUSKNIS 06/21/21 1455 Performed at [facility], [address], [place], [place] [address]      RN Pressure Injury Documentation:     Estimated body mass index is 25.87 kg/m  as calculated from the following:   Height as of this encounter: 5\' 6"  (1.676 m).   Weight as of this encounter: 72.7 kg.  Malnutrition Type: Nutrition Problem: Severe Malnutrition Etiology: chronic illness (major  depressive disorder) Malnutrition Characteristics: Signs/Symptoms: percent weight loss, severe fat depletion, severe muscle depletion Percent weight loss: 6.5 % (x2 months) Nutrition Interventions: Interventions: Tube feeding Radiology Studies: DG Chest Port 1 View  Result Date: 06/20/2021 CLINICAL DATA:  Fever EXAM: PORTABLE CHEST 1 VIEW COMPARISON:  06/11/2021 FINDINGS: Transverse diameter of heart is increased. Thoracic aorta is tortuous and ectatic. There is poor inspiration which may account for slight prominence of central pulmonary vessels. There are linear densities in the left parahilar region. New patchy infiltrates are seen in the right lower lung fields. There is no pleural effusion or pneumothorax. Enteric tube is noted traversing the thoracic esophagus. Skin fold is noted in the lateral aspect of left lower lung fields. IMPRESSION: There is new patchy infiltrate in right lower lung fields suggesting atelectasis/pneumonia. There are transverse linear densities in the left parahilar region suggesting scarring or subsegmental atelectasis or lung nodules. Electronically Signed   By: Elmer Picker M.D.   On: 06/20/2021 16:59    Scheduled Meds:  sodium chloride   Intravenous Once   apixaban  5 mg Oral BID   Chlorhexidine Gluconate Cloth  6 each Topical Daily   cyanocobalamin  1,000 mcg Intramuscular Daily   free water  100 mL Per Tube Q4H   haloperidol lactate  0.5 mg Intravenous QHS   mirtazapine  7.5 mg Per Tube QHS   multivitamin with minerals  1 tablet Per Tube Daily   sodium bicarbonate  650 mg Per Tube BID   tamsulosin  0.4 mg Oral QPC supper   thiamine  100 mg Oral Daily   [START ON 06/22/2021] Vitamin D (Ergocalciferol)  50,000 Units Oral Q7 days   Continuous Infusions:  ampicillin-sulbactam (UNASYN) IV 3 g (06/21/21 1334)   feeding supplement (OSMOLITE 1.2 CAL) 65 mL/hr at 06/21/21 1500   thiamine 500mg  in normal saline (59ml) IVPB 500 mg (06/21/21 0948)    LOS: 2  days   Kerney Elbe, DO [facility] PAGER is on AMION  If 7PM-7AM, please contact night-coverage [email or web address]

## 2021-06-21 NOTE — Progress Notes (Signed)
Patient on therapeutic enoxaparin from 1/15 to 1/24 for Dvt treatment.  Noted AC HELD on 1/24 due to gross hematuria and low Hgb. Patient received 1 units if PRBSc.   Hgb up to 8.0, hematuria resolved. Okay to start Eliquis per Dr Theone Murdoch, but will need close bleeding monitoring.  Will resume anticoagulation for DVT treatment with Eliquis 5mg  BID after completing 9 days of LMWH treatment.

## 2021-06-21 NOTE — Consult Note (Signed)
ECT consult update: She has checked chart and realized patient had tested positive for C. difficile and stool this morning.  I will have to review with anesthesia their comfort level or preferences about treatment which we may need to defer until this is improved.  Meanwhile I have gone ahead and put in orders to take off tube feeds at midnight so that she would be ready for treatment if possible.

## 2021-06-21 NOTE — Progress Notes (Addendum)
SLP Cancellation Note  Patient Details Name: Amber [name] MRN: [id number] DOB: 06-11-1945   Cancelled treatment:       Reason Eval/Treat Not Completed: Patient declined, no reason specified   SLP consult received and appreciated. Chart review completed. Pt known to SLP services from recent admission to [facility] Per chart review, pt's most recent diet recommendation was for a full liquid diet (06/14/21); however, note pt with refusal of PO trials on 06/13/21 & 06/19/21. Pt also refused esophagram while admitted to [facility]. Initial SLP evaluation on 06/09/21 noted, "Daughter stated that the pt began refusing food/drinks a few weeks ago, but that she is able to drink without overt coughing or choking if she is distracted.  Daughter was unaware of a choking incident or any other incident that may have led to PO refusal other than intermittent stomach pain.  She did report however, that the pt has had a few hallucinations in the past few weeks and that she appeared to be anxious/depressed.  Pt has been refusing medications at home as well as food/drinks despite family efforts."   Pt transferred to [facility] on 06/19/21 with psychotic depression with plan for ECT. Pt with plan for ECT tentatively beginning 06/22/21. Pt with Cort[place] feeding tube with bridle which is being utilize for nutrition.   Upon SLP entrance to room, pt lethargic and slow to respond. Pt able to rouse for PO trials with verbal stimulation. Despite education, pt refused PO trials stating, "I just can't..." repeatedly.   SLP to f/u for clinical swallowing evaluation, as appropriate. Given pt's repeated declination of PO intake, it may be beneficial to defer until completion of several ECT treatments/pt's participation improves. Recommend continuation of alternate means of nutrition/hydration/medication if in alignment with GOC.   RN made aware of SLP efforts and SLP POC.   Amber [name], M.S., [facility] [phone] (Amber [name])   Amber [name] 06/21/2021, 10:50 AM

## 2021-06-21 NOTE — Plan of Care (Signed)
°  Problem: Safety: Goal: Non-violent Restraint(s) 06/21/2021 1023 by Lanijah Warzecha, Camillo Flaming, RN Outcome: Progressing 06/21/2021 1019 by Jerrell Mangel, Camillo Flaming, RN Outcome: Progressing   Problem: Nutrition Goal: Patient maintains adequate hydration Outcome: Progressing Goal: Patient maintains weight Outcome: Progressing Goal: Patient/Family demonstrates understanding of diet Outcome: Progressing Goal: Patient/Family independently completes tube feeding Outcome: Progressing Goal: Patient will have no more than 5 lb weight change during LOS Outcome: Progressing Goal: Patient will utilize adaptive techniques to administer nutrition Outcome: Progressing Goal: Patient will verbalize dietary restrictions Outcome: Progressing   Problem: Education: Goal: Utilization of techniques to improve thought processes will improve Outcome: Progressing Goal: Knowledge of the prescribed therapeutic regimen will improve Outcome: Progressing   Problem: Activity: Goal: Interest or engagement in leisure activities will improve Outcome: Progressing Goal: Imbalance in normal sleep/wake cycle will improve Outcome: Progressing   Problem: Coping: Goal: Coping ability will improve Outcome: Progressing Goal: Will verbalize feelings Outcome: Progressing   Problem: Health Behavior/Discharge Planning: Goal: Ability to make decisions will improve Outcome: Progressing Goal: Compliance with therapeutic regimen will improve Outcome: Progressing   Problem: Role Relationship: Goal: Will demonstrate positive changes in social behaviors and relationships Outcome: Progressing   Problem: Safety: Goal: Ability to disclose and discuss suicidal ideas will improve Outcome: Progressing Goal: Ability to identify and utilize support systems that promote safety will improve Outcome: Progressing   Problem: Self-Concept: Goal: Will verbalize positive feelings about self Outcome: Progressing Goal: Level of anxiety will  decrease Outcome: Progressing

## 2021-06-21 NOTE — Consult Note (Signed)
Follow-up ECT consult: Patient seen.  Also met in person with her husband and daughter.  Patient continues to be extremely withdrawn and negativistic about everything.  Unable to engage in conversation about treatment planning.  Met with husband and daughter and expressed my recommendation for electroconvulsive therapy treatment for severe major depression with psychosis.  Reviewed procedure and reviewed risks and benefits.  Family signed consent forms.  Orders will be placed to stop tube feeds and make patient n.p.o. after midnight for planned treatment tomorrow.  Labs have been reviewed.  Nothing new required at this point.

## 2021-06-22 ENCOUNTER — Encounter: Payer: Self-pay | Admitting: Anesthesiology

## 2021-06-22 LAB — CBC WITH DIFFERENTIAL/PLATELET
Abs Immature Granulocytes: 0.12 10*3/uL — ABNORMAL HIGH (ref 0.00–0.07)
Basophils Absolute: 0 10*3/uL (ref 0.0–0.1)
Basophils Relative: 0 %
Eosinophils Absolute: 0.1 10*3/uL (ref 0.0–0.5)
Eosinophils Relative: 2 %
HCT: 25 % — ABNORMAL LOW (ref 36.0–46.0)
Hemoglobin: 8.1 g/dL — ABNORMAL LOW (ref 12.0–15.0)
Immature Granulocytes: 3 %
Lymphocytes Relative: 31 %
Lymphs Abs: 1.4 10*3/uL (ref 0.7–4.0)
MCH: 30.2 pg (ref 26.0–34.0)
MCHC: 32.4 g/dL (ref 30.0–36.0)
MCV: 93.3 fL (ref 80.0–100.0)
Monocytes Absolute: 0.2 10*3/uL (ref 0.1–1.0)
Monocytes Relative: 5 %
Neutro Abs: 2.6 10*3/uL (ref 1.7–7.7)
Neutrophils Relative %: 59 %
Platelets: 151 10*3/uL (ref 150–400)
RBC: 2.68 MIL/uL — ABNORMAL LOW (ref 3.87–5.11)
RDW: 15 % (ref 11.5–15.5)
WBC: 4.4 10*3/uL (ref 4.0–10.5)
nRBC: 0 % (ref 0.0–0.2)

## 2021-06-22 LAB — COMPREHENSIVE METABOLIC PANEL
ALT: 24 U/L (ref 0–44)
AST: 29 U/L (ref 15–41)
Albumin: 1.7 g/dL — ABNORMAL LOW (ref 3.5–5.0)
Alkaline Phosphatase: 56 U/L (ref 38–126)
Anion gap: 6 (ref 5–15)
BUN: 22 mg/dL (ref 8–23)
CO2: 22 mmol/L (ref 22–32)
Calcium: 8.9 mg/dL (ref 8.9–10.3)
Chloride: 109 mmol/L (ref 98–111)
Creatinine, Ser: 0.96 mg/dL (ref 0.44–1.00)
GFR, Estimated: >60 mL/min (ref >60)
Glucose, Bld: 86 mg/dL (ref 70–99)
Potassium: 3.9 mmol/L (ref 3.5–5.1)
Sodium: 137 mmol/L (ref 135–145)
Total Bilirubin: 0.5 mg/dL (ref 0.3–1.2)
Total Protein: 4.2 g/dL — ABNORMAL LOW (ref 6.5–8.1)

## 2021-06-22 LAB — GLUCOSE, CAPILLARY
Glucose-Capillary: 117 mg/dL — ABNORMAL HIGH (ref 70–99)
Glucose-Capillary: 124 mg/dL — ABNORMAL HIGH (ref 70–99)
Glucose-Capillary: 83 mg/dL (ref 70–99)
Glucose-Capillary: 85 mg/dL (ref 70–99)
Glucose-Capillary: 86 mg/dL (ref 70–99)
Glucose-Capillary: 91 mg/dL (ref 70–99)
Glucose-Capillary: 96 mg/dL (ref 70–99)

## 2021-06-22 LAB — PHOSPHORUS: Phosphorus: 2.8 mg/dL (ref 2.5–4.6)

## 2021-06-22 LAB — MAGNESIUM: Magnesium: 1.7 mg/dL (ref 1.7–2.4)

## 2021-06-22 MED ORDER — MAGNESIUM SULFATE 2 GM/50ML IV SOLN
2.0000 g | Freq: Once | INTRAVENOUS | Status: AC
  Administered 2021-06-22: 2 g via INTRAVENOUS
  Filled 2021-06-22: qty 50

## 2021-06-22 MED ORDER — OSMOLITE 1.2 CAL PO LIQD
1000.0000 mL | ORAL | Status: AC
  Administered 2021-06-22 – 2021-06-24 (×2): 1000 mL

## 2021-06-22 MED ORDER — PANTOPRAZOLE SODIUM 40 MG IV SOLR
40.0000 mg | Freq: Every day | INTRAVENOUS | Status: DC
  Administered 2021-06-22 – 2021-06-23 (×2): 40 mg via INTRAVENOUS
  Filled 2021-06-22 (×2): qty 40

## 2021-06-22 NOTE — Progress Notes (Signed)
SLP Cancellation Note  Patient Details Name: Amber [name] MRN: [id number] DOB: August 17, 1945   Cancelled treatment:       Reason Eval/Treat Not Completed: Patient at procedure or test/unavailable (chart reviewed; pt is currently NPO for ECT today. Discussed w/ NSG. Will attempt to f/u this PM if pt available/appropriate. NSG agreed.) NGT present. Recommend frequent oral care for hygiene and stimulation of swallowing.     Orinda Kenner, MS, CCC-SLP Speech Language Pathologist Rehab Services; [place] [phone] (ascom) Antonie Borjon 06/22/2021, 9:11 AM

## 2021-06-22 NOTE — Anesthesia Preprocedure Evaluation (Deleted)
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  tested positive for C. Difficile  Foley catheter was recently placed and it was traumatic insertion with gross development of gross hematuria. Hbg 8.1 1/27  She was admitted as a transfer from [facility] for consideration of ECT by Dr. Weber Cooks of psychiatry.  She was admitted to [facility] from 06/08/21-06/19/21 after presenting with failure to thrive following a several week history of complaints of difficulty swallowing with decreased oral intake and subsequent development of confusion and hallucinations.  Reproductive/Obstetrics                             Anesthesia Physical Anesthesia Plan Anesthesia Quick Evaluation

## 2021-06-22 NOTE — Progress Notes (Signed)
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 Weber Cooks of psychiatry.  She was admitted to [facility] from 06/08/21-06/19/21 after presenting with failure to thrive following a several week history of complaints of difficulty swallowing with decreased oral intake and subsequent development of confusion and hallucinations.  She had outpatient work-up include brain MRI as well as MRV which was unremarkable.  On admission she is found to have an AKI and several electrolyte abnormalities.  While at [facility] she refused to eat and refused further evaluation with esophagram to evaluate her sensation of dysphagia.  She is evaluated by psychiatry who determined her to have capacity for medical decision-making and so an NG tube was placed for force-feeding.  Palliative care consult was also done.  Due to her poor response to oral antidepressants ECG was recommended and thus arranged to transfer to [facility].  On 06/08/2020 she tested positive for COVID and she received 3 days of remdesivir.  Most of her nutritional deficiencies related to inadequate oral intake were corrected prior to transfer and on the day of discharge she developed acute urinary tension a Foley catheter was placed and it was traumatic insertion with gross development of gross hematuria.  Hemoglobin did drop to 7.0 and repeat is pending.   On the afternoon of 06/21/21 she spiked a temperature of 101.9 and became tachycardic and slightly hypotensive.  She is given IV fluid bolus and she is pancultured.  Further work-up revealed that she had a right lower lobe  pneumonia so she was started on IV Unasyn.  She had some loose stools and this was tested for C. difficile however we can consider C. difficile colonization given that she is afebrile and has no leukocytosis.  SLP evaluated and recommending a clear liquid diet with thins in addition to an NG tube.  Given that she has a recent pneumonia and anesthesia felt it would be safer to wait a couple more days and for ECT treatment until that time to help clear her pneumonia.  Tentatively she is now scheduled for Monday, 06/25/2021.  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[place]) Active Problems:   Moderate protein-calorie malnutrition ([place])   Primary hyperparathyroidism ([place])   Dysphagia   COVID-19 virus infection 06/08/2021   Nasogastric tube present   Chronic deep vein thrombosis (DVT) (HCC)   Chronic anticoagulation   ABLA (acute blood loss anemia)   Acute urinary retention   Foley catheter in place   Stage 3a chronic kidney disease (HCC)   At high risk for inadequate nutritional intake   Gross hematuria   Fever   Aspiration pneumonia of right lung due to gastric secretions (HCC)  Psychotic depression placing patient at high nutritional risk  Need for electroconvulsive therapy -Psych consult to evaluate for ECT Per arrangements made prior to transfer -Continue mirtazapine 7.5 mg per tube via NGT -Further care per psychiatry and they are planning ECT for 06/22/21 however this has been deferred until Monday, 06/25/2021   Inadequate oral nutritional intake  Nutritional deficiencies requiring NGT for force-feeding Moderate protein-calorie malnutrition ([place]) Dysphagia of  unknown etiology -Nutritionist consult for tube feeds - Continue nutritional supplementation via NG tube - Patient has refused further evaluation of dysphagia to evaluate for organic etiology -She does not have the capacity so we will need to get SLP involved again -Continue with Osmolite per nutritionist  recommendations and I have started the patient on clears -Have also consulted speech who recommended clear liquid diet and advancing diet once the CT is completed  Sepsis 2/2 Aspiration PNA  -Became febrile and tachycardic this afternoon as well as hypotensive slightly; likely could have aspirated with her NG tube in place -Initiate IV Unasyn and will continue; her fevers have since resolved and WBC is improved -Given IV fluid boluses 2 L and we will panculture and obtain blood cultures x2, urinalysis and urine culture as well as chest x-ray -Chest x-ray done and showed "There is new patchy infiltrate in right lower lung fields suggesting atelectasis/pneumonia. There are transverse linear densities in the left parahilar region suggesting scarring or subsegmental atelectasis or lung nodules." -We will order flutter valve and incentive spirometer -C. difficile was checked and she is positive for toxin producing C. difficile however given that she does not have a white count we will not currently treat as we believe this may be a colonization -Remains significantly withdrawn but not complaining of any shortness of breath -Repeat chest x-ray in the a.m.  Thrombocytopenia -Patient's platelet count went from 181 ->144 -> 132 -> 151 -Has been bleeding as her Foley catheter -Continue monitor for signs and symptoms bleeding; currently her Foley has some darker red blood in it but this is improving -Repeat CBC in the a.m.   Acute urinary retention Foley catheter in place -Continue Foley catheter for now and do trial of void soon and possibly in the AM  --Continue Tamsulosin   ABLA (acute blood loss anemia) 1/24 Gross hematuria secondary to traumatic Foley -Hemoglobin continued to drop and trended down from 9.2/28.3 is now 6.7/21.0 so she was typed and screened and transfused 2 units of PRBCs and went to 8.2/25.4 -> 8.0/24.1 -> 8.1/25.0 with no other signs of bleeding -We will type and screen and  transfuse 1 unit PRBCs -Continued to trend H&H and transfuse if necessary(daughter consented via telephone) -One time NS bolus ordered for another few boluses as above -CBI may be necessary -Resumed anticoagulation with Eliquis   Chronic anticoagulation due to chronic distal femoral DVT - Initially was holding Eliquis and Lovenox at this time given her hemoglobin drop -SCDs for DVT prophylaxis -Resumed systemic anticoagulation with Eliquis   COVID-19 virus infection 06/08/2021 - Not symptomatic -Completed remdesivir for 3-day -We will treat her aspiration pneumonia as above   Stage 3a chronic kidney disease ([place]) Primary hyperparathyroidism/vitamin D deficiency - Continue supplementation via NG tube -Patient's BUNs/creatinine went from 34/1.14 -> 29/1.19 -> 36/1.23 -> 35/1.19 -> 29/1.06 -> 22/0/96 -Avoid further nephrotoxic medications, contrast dyes, hypotension and dehydration and renally dose medications -Repeat CMP in the AM   Severe malnutrition related to chronic illness -Evidence by percent weight loss, severe fat depletion and severe muscle depletion -Nutritionist consulted for further evaluation recommendations and recommended initiating tube feedings via NG tube Osmolite 1.2 at 65 MLS per hour with 100 mL of free water flushes every 4 hours and her liquid multivitamin was discontinued and they recommended crushing multivitamin with minerals per tube daily  DVT prophylaxis: SCDs Code Status: DO NOT RESUSCITATE Family Communication: No family currently at bedside Disposition Plan: Pending further clinical improvement and clearance by  psychiatry  Status is: Inpatient  Remains inpatient appropriate because: She is spiking temperatures and will be getting ECT on Friday    Consultants:  Psychiatry   Procedures: None  Antimicrobials:  Anti-infectives (From admission, onward)    Start     Dose/Rate Route Frequency Ordered Stop   06/20/21 1900  Ampicillin-Sulbactam  (UNASYN) 3 g in sodium chloride 0.9 % 100 mL IVPB        3 g 200 mL/hr over 30 Minutes Intravenous Every 8 hours 06/20/21 1812          Subjective: Seen and examined at bedside and she is not as withdrawn today and she is much more awake and alert.  States that she slept okay.  Appeared to have a better mood but states that she does not want anything done without her consent.  No nausea or vomiting.  Only had 1 bowel movement this afternoon.  No other concerns or complaints this time  Objective: Vitals:   06/22/21 0556 06/22/21 0822 06/22/21 1251 06/22/21 1609  BP: 110/72 118/61 117/65 (!) 100/55  Pulse: 98 (!) 101 92 89  Resp: 18 16 16 18   Temp: 97.7 F (36.5 C) 98.7 F (37.1 C) 97.6 F (36.4 C) 98.1 F (36.7 C)  TempSrc: Oral  Oral Oral  SpO2: 97% 100% 99% 99%  Weight:      Height:        Intake/Output Summary (Last 24 hours) at 06/22/2021 1755 Last data filed at 06/22/2021 1500 Gross per 24 hour  Intake 262.12 ml  Output 1502 ml  Net -1239.88 ml    Filed Weights   06/20/21 0512 06/20/21 0513 06/21/21 0112  Weight: 66.4 kg 66.4 kg 72.7 kg   Examination: Physical Exam:  Constitutional: WN/WD Caucasian female currently resting and in no acute distress Eyes:  Lids and conjunctivae normal, sclerae anicteric  ENMT: External Ears, Nose appear normal. Grossly normal hearing. Mucous membranes are moist.  Cortrak tube is in place Neck: Appears normal, supple, no cervical masses, normal ROM, no appreciable thyromegaly; no appreciable JVD Respiratory: Diminished to auscultation bilaterally with coarse breath sounds, no wheezing, rales, rhonchi or crackles. Normal respiratory effort and patient is not tachypenic. No accessory muscle use.  Unlabored breathing Cardiovascular: RRR, no murmurs / rubs / gallops. S1 and S2 auscultated.  Has trace lower extremity edema  abdomen: Soft, non-tender, Slightly Distended 2/2 body habitus. Bowel sounds positive.  GU:  Deferred. Musculoskeletal: No clubbing / cyanosis of digits/nails. No joint deformity upper and lower extremities. Skin: No rashes, lesions, ulcers on limited skin evaluation. No induration; Warm and dry.  Neurologic: CN 2-12 grossly intact with no focal deficits. Romberg sign and cerebellar reflexes not assessed.  Psychiatric: Normal judgment and insight. Alert and oriented x 3. Normal mood and appropriate affect.   Data Reviewed: I have personally reviewed following labs and imaging studies  CBC: Recent Labs  Lab 06/19/21 0638 06/19/21 1634 06/19/21 2248 06/20/21 0806 06/20/21 1643 06/21/21 0242 06/21/21 0940 06/22/21 0656  WBC 6.6 6.8  --  7.0 6.5  --  6.6 4.4  NEUTROABS 5.5  --   --  5.4 5.1  --  4.8 2.6  HGB 9.2* 7.4*   < > 7.0* 6.7* 8.2* 8.0* 8.1*  HCT 28.3* 23.3*   < > 21.8* 21.0* 25.4* 24.1* 25.0*  MCV 91.9 92.5  --  94.4 93.8  --  92.7 93.3  PLT 181 163  --  149* 144*  --  132* 151   < > =  values in this interval not displayed.    Basic Metabolic Panel: Recent Labs  Lab 06/19/21 0638 06/19/21 1634 06/20/21 0806 06/20/21 1314 06/20/21 1643 06/21/21 0242 06/21/21 0940 06/21/21 1705 06/22/21 0656  NA 136  --  137  --  135  --  138  --  137  K 4.5  --  4.2  --  4.1  --  4.1  --  3.9  CL 107  --  110  --  108  --  111  --  109  CO2 23  --  22  --  22  --  23  --  22  GLUCOSE 95  --  129*  --  110*  --  101*  --  86  BUN 29*  --  36*  --  35*  --  29*  --  22  CREATININE 1.19*  --  1.23*  --  1.19*  --  1.06*  --  0.96  CALCIUM 9.2  --  9.0  --  8.8*  --  8.6*  --  8.9  MG 2.1   < > 2.0   < > 1.9 1.8 1.8 1.8 1.7  PHOS 2.9   < > 2.9   < > 2.7 2.5 2.6 2.7 2.8   < > = values in this interval not displayed.    GFR: Estimated Creatinine Clearance: 53.3 mL/min (by C-G formula based on SCr of 0.96 mg/dL). Liver Function Tests: Recent Labs  Lab 06/20/21 0806 06/20/21 1643 06/21/21 0940 06/22/21 0656  AST 16 16 19 29   ALT 17 15 18 24   ALKPHOS 48 50 53 56   BILITOT 0.4 0.5 0.5 0.5  PROT 4.3* 4.1* 4.1* 4.2*  ALBUMIN 1.8* 1.7* 1.7* 1.7*    No results for input(s): LIPASE, AMYLASE in the last 168 hours. Recent Labs  Lab 06/18/21 0835  AMMONIA 13    Coagulation Profile: No results for input(s): INR, PROTIME in the last 168 hours. Cardiac Enzymes: No results for input(s): CKTOTAL, CKMB, CKMBINDEX, TROPONINI in the last 168 hours. BNP (last 3 results) No results for input(s): PROBNP in the last 8760 hours. HbA1C: No results for input(s): HGBA1C in the last 72 hours. CBG: Recent Labs  Lab 06/22/21 0533 06/22/21 0740 06/22/21 0920 06/22/21 1132 06/22/21 1612  GLUCAP 86 85 91 83 117*    Lipid Profile: No results for input(s): CHOL, HDL, LDLCALC, TRIG, CHOLHDL, LDLDIRECT in the last 72 hours. Thyroid Function Tests: No results for input(s): TSH, T4TOTAL, FREET4, T3FREE, THYROIDAB in the last 72 hours. Anemia Panel: No results for input(s): VITAMINB12, FOLATE, FERRITIN, TIBC, IRON, RETICCTPCT in the last 72 hours.  Sepsis Labs: No results for input(s): PROCALCITON, LATICACIDVEN in the last 168 hours.  Recent Results (from the past 240 hour(s))  CULTURE, BLOOD (ROUTINE X 2) w Reflex to ID Panel     Status: None (Preliminary result)   Collection Time: 06/20/21  4:42 PM   Specimen: BLOOD RIGHT ARM  Result Value Ref Range Status   Specimen Description BLOOD RIGHT ARM  Final   Special Requests   Final    BOTTLES DRAWN AEROBIC AND ANAEROBIC Blood Culture adequate volume   Culture   Final    NO GROWTH 2 DAYS Performed at [facility], [address] [place], [place] [address]    Report Status PENDING  Incomplete  CULTURE, BLOOD (ROUTINE X 2) w Reflex to ID Panel     Status: None (Preliminary result)   Collection  Time: 06/20/21  4:53 PM   Specimen: BLOOD RIGHT HAND  Result Value Ref Range Status   Specimen Description BLOOD RIGHT HAND  Final   Special Requests   Final    BOTTLES DRAWN AEROBIC AND ANAEROBIC Blood  Culture adequate volume   Culture   Final    NO GROWTH 2 DAYS Performed at [facility], [address], [place], [place] [address]    Report Status PENDING  Incomplete  Urine Culture     Status: Abnormal (Preliminary result)   Collection Time: 06/21/21  7:30 AM   Specimen: Urine, Random  Result Value Ref Range Status   Specimen Description   Final    URINE, RANDOM Performed at [facility], [address] [place], [place] [address]    Special Requests   Final    NONE Performed at [facility], [place], [place] [address]    Culture 70,000 COLONIES/mL PSEUDOMONAS AERUGINOSA (A)  Final   Report Status PENDING  Incomplete  C Difficile Quick Screen w PCR reflex     Status: Abnormal   Collection Time: 06/21/21  1:48 PM   Specimen: STOOL  Result Value Ref Range Status   C Diff antigen POSITIVE (A) NEGATIVE Final   C Diff toxin POSITIVE (A) NEGATIVE Final   C Diff interpretation Toxin producing C. difficile detected.  Final    Comment: CRITICAL RESULT CALLED TO, READ BACK BY AND VERIFIED WITH: [name] HUSKNIS 06/21/21 1455 Performed at [facility], [address] [place], [place] [address]      RN Pressure Injury Documentation:     Estimated body mass index is 25.87 kg/m as calculated from the following:   Height as of this encounter: 5\' 6"  (1.676 m).   Weight as of this encounter: 72.7 kg.  Malnutrition Type: Nutrition Problem: Severe Malnutrition Etiology: chronic illness (major depressive disorder) Malnutrition Characteristics: Signs/Symptoms: percent weight loss, severe fat depletion, severe muscle depletion Percent weight loss: 6.5 % (x2 months) Nutrition Interventions: Interventions: Tube feeding Radiology Studies: No results found.  Scheduled Meds:  sodium chloride   Intravenous Once   apixaban  5 mg Oral BID   Chlorhexidine Gluconate Cloth  6 each Topical Daily   cyanocobalamin  1,000 mcg Intramuscular  Daily   free water  100 mL Per Tube Q4H   haloperidol lactate  0.5 mg Intravenous QHS   mirtazapine  7.5 mg Per Tube QHS   multivitamin with minerals  1 tablet Per Tube Daily   sodium bicarbonate  650 mg Per Tube BID   tamsulosin  0.4 mg Oral QPC supper   thiamine  100 mg Oral Daily   Vitamin D (Ergocalciferol)  50,000 Units Oral Q7 days   Continuous Infusions:  ampicillin-sulbactam (UNASYN) IV 3 g (06/22/21 1441)   feeding supplement (OSMOLITE 1.2 CAL) 1,000 mL (06/22/21 1229)   thiamine 500mg  in normal saline (16ml) IVPB 500 mg (06/22/21 1131)    LOS: 3 days   Kerney Elbe, DO [facility] PAGER is on AMION  If 7PM-7AM, please contact night-coverage [email or web address]

## 2021-06-22 NOTE — TOC Initial Note (Signed)
Transition of Care [facility]) - Initial/Assessment Note    Patient Details  Name: Amber [name] MRN: [id number] Date of Birth: 1946/04/11  Transition of [facility]) CM/SW Contact:    Pete Pelt, RN Phone Number: 06/22/2021, 11:00 AM  Clinical Narrative:   Patient lives at home with husband, who assists to care for her.  Daughters assist patient with appointments and medications.    Patient and family have concerns about patient's condition on discharge.  Spouse is not able to care for her if she is not at least partially mobile.  Discussed potential options and that TOC will keep in touch with family and care team regarding patient condition and disposition.  Patient will have her first ECT treatment today.                  Expected Discharge Plan:  (TBD) Barriers to Discharge: Continued Medical Work up   Patient Goals and CMS Choice     Choice offered to / list presented to : NA  Expected Discharge Plan and Services Expected Discharge Plan:  (TBD)   Discharge Planning Services: CM Consult Post Acute Care Choice:  (TBD) Living arrangements for the past 2 months: Single Family Home                                      Prior Living Arrangements/Services Living arrangements for the past 2 months: Single Family Home Lives with:: Self, Spouse Patient language and need for interpreter reviewed::  (Spoke to family) Do you feel safe going back to the place where you live?: Yes      Need for Family Participation in Patient Care: Yes (Comment) Care giver support system in place?: Yes (comment)   Criminal Activity/Legal Involvement Pertinent to Current Situation/Hospitalization: No - Comment as needed  Activities of Daily Living Home Assistive Devices/Equipment: Walker (specify type) ADL Screening (condition at time of admission) Patient's cognitive ability adequate to safely complete daily activities?: No Is the patient deaf or have difficulty hearing?: No Does the  patient have difficulty seeing, even when wearing glasses/contacts?: Yes Does the patient have difficulty concentrating, remembering, or making decisions?: Yes Patient able to express need for assistance with ADLs?: Yes Does the patient have difficulty dressing or bathing?: Yes Independently performs ADLs?: No Communication: Independent Dressing (OT): Needs assistance Is this a change from baseline?: Pre-admission baseline Grooming: Needs assistance Is this a change from baseline?: Pre-admission baseline Feeding: Independent Bathing: Needs assistance Is this a change from baseline?: Pre-admission baseline Toileting: Dependent Is this a change from baseline?: Pre-admission baseline In/Out Bed: Dependent Is this a change from baseline?: Pre-admission baseline Walks in Home: Dependent Is this a change from baseline?: Pre-admission baseline Does the patient have difficulty walking or climbing stairs?: Yes Weakness of Legs: Both Weakness of Arms/Hands: Both  Permission Sought/Granted Permission sought to share information with : Case Manager Permission granted to share information with : Yes, Verbal Permission Granted              Emotional Assessment         Alcohol / Substance Use: Not Applicable Psych Involvement: Yes (comment)  Admission diagnosis:  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[place]) [F32.3] Patient Active Problem List   Diagnosis Date Noted   Fever    Aspiration pneumonia of right lung due to gastric secretions ([place])    Protein-calorie malnutrition, severe 06/19/2021   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[place]) 06/19/2021  Failure to thrive (child) 06/19/2021   Nasogastric tube present 06/19/2021   Chronic deep vein thrombosis (DVT) ([place]) 06/19/2021   Chronic anticoagulation 06/19/2021   ABLA (acute blood loss anemia) 06/19/2021   Acute urinary retention 06/19/2021   Foley catheter in place 06/19/2021   Stage 3a chronic kidney disease ([place]) 06/19/2021   At high risk for inadequate  nutritional intake 06/19/2021   Gross hematuria 06/19/2021   MDD (major depressive disorder), single episode, severe with psychotic features ([place])    Dysphagia    COVID-19 virus infection 06/08/2021    Major depressive disorder, recurrent episode, severe ([place]) 06/09/2021   Failure to thrive in adult 06/08/2021   Hypernatremia 06/08/2021   Primary hyperparathyroidism ([place]) 06/07/2021   Hyperthyroidism 04/25/2021   Encounter for examination following treatment at hospital 04/25/2021   Gastroesophageal reflux disease 04/25/2021   Gait abnormality 03/29/2021   Urinary incontinence 03/29/2021   Moderate protein-calorie malnutrition ([place]) 03/29/2021   Depression with anxiety 03/29/2021   Aortic atherosclerosis ([place]) 03/29/2021   Adnexal mass 03/29/2021   PCP:  Lindell Spar, MD Pharmacy:   [facility], [address] [place] [address] Phone: [phone] Fax: [phone]     Social Determinants of Health (SDOH) Interventions    Readmission Risk Interventions No flowsheet data found.

## 2021-06-22 NOTE — Evaluation (Addendum)
Clinical/Bedside Swallow Evaluation Patient Details  Name: Amber [name] MRN: [id number] Date of Birth: May 16, 1946  Today's Date: 06/22/2021 Time: SLP Start Time (ACUTE ONLY): 1300 SLP Stop Time (ACUTE ONLY): 1340 SLP Time Calculation (min) (ACUTE ONLY): 40 min  Past Medical History:  Past Medical History:  Diagnosis Date   Anxiety    Hypertension    Past Surgical History: History reviewed. No pertinent surgical history. HPI:  Amber [name], 76 y.o. 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  Per pt's family, pt has been slowly declining over the past 2 weeks d/t decreased oral intake.  As per daughter patient has had difficulty for several weeks with dry heaves after eating solid food, episodes of confusion with hallucination, and complaining of feeling something stuck in the throat when swallowing solid food.  Head CT negative for acute abnormalities though "Cerebral Atrophy, Chronic atrophy and white matter microvascular disease"; CXR on 06/20/2021 revealed new patchy infiltrate in right lower lung fields suggesting  atelectasis/pneumonia. There are transverse linear densities in the  left parahilar region suggesting scarring or subsegmental  atelectasis or lung nodules.  Per chart notes, Daughter reports that patient has past history of Major Depression for which she took anti-depressant many years ago. However, she reports that patient has been getting more depressed in the last few weeks and was placed on Remeron which she stopped taking after she compliant of difficulty swallowing.Daughter reports that patient has not been eating in the last few weeks and she is already losing weight. She has observed worsening depression characterized by hopelessness, low energy level, fatigue, social withdrawal, poor appetite, passive suicidal thoughts and insomnia. Per daughter, has been having delusions and  visual hallucinations at times. Patient is uncooperative, refuses to answer most questions and pleaded to be left alone.   Assessment / Plan / Recommendation  Clinical Impression  Pt was seen for BSE in the setting of recent refusal of po's, FTT, and difficulty consuming liquids and solids over the past few weeks - "dry heaves after eating solid food" and "stuck feelings" w/ foods. Pt currently has a NGT for TFs for nutrition/hydration support s/p continued refusal of oral intake. During previous BSE at [facility], Dtr stated that pt began refusing food/drinks a few weeks ago, but that she is able to drink without overt coughing or choking if she is distracted. Dtr was unaware of any choking incident or any other incident that may have led to PO refusal other than intermittent stomach pain. She reported however, that the pt has had a few hallucinations in the past few weeks and that she appeared to be anxious. Pt has been refusing medications at home as well as food/drinks despite family efforts. Unaware of any GI f/u for potential Esophageal phase Dysmotility. Pt is not on a PPI currently per chart.  Pt followed general requrests allowing this SLP to complete brief OME - No overt lingual/labial unilateral weakness noted; Speech Clear. During the few po trials of thin liquids via Straw pt accepted, pt appears to present w/ adequate oropharyngeal phase swallow function w/ No overt oropharyngeal phase dysphagia noted, no neuromuscular deficits noted. Pt consumed the trials w/ No overt, clinical s/s of aspiration; No overt coughing, decline in vocal quality, or change in respiratory presentation during/post trials. Oral phase appeared [facility] w/ timely bolus management and control of bolus propulsion for A-P transfer for swallowing. Oral clearing achieved w/ trials;  no anterior spillage or loss.  Pt fed self w/ setup support and offering of the cup. She refused any further po trials of liquids and foods despite  encouragement from this SLP. Her wishes were respected at the time.   Pt appears at min risk for aspiration at this time in light of NGT TFs ongoing, decline in medical and Psychiatric status', and deconditioned status. Pt has a lack of desire for oral intake in general.   Recommend REFLUX PRECAUTIONS during ongoing NGT TFs including HOB elevated. Pt is at increased risk for aspiration of NGT REFLUX of TFs thus impact on Pulmonary status.  This was a limited exam, however, pt appears to present w/ functional oropharyngeal swallowing of thin liquids. Recommend a Clear liquids diet, Thins, while pt has NGT in place. Upgrade to solids can be considered by MD/pt once NGT is removed. Recommend general aspiration precautions, Pills CRUSHED in Puree for safer swallowing if taking orally vs NGT.   Education given on trying daily some sips of liquids following general aspiration precautions; pt agreed. Suspect may have times when she desires sips of drinks, and times she does not.  If Drowsy d/t ECT txs, then recommend HOLD any po's until pt is fully alert/awake to safely participate in oral intake. NSG/MD updated and will reconsult if any new needs arise. NSG agreed. Precautions posted in room.   Recommend f/u w/ GI for assessment to address any potential Esophageal Dysmotility.  SLP Visit Diagnosis: Dysphagia, unspecified (R13.10) (suspect impacted by Mental Status and lack of desire for oral intake)    Aspiration Risk  Risk for inadequate nutrition/hydration    Diet Recommendation   Clear liquid diet, thins. General aspiration precautions. Support and encouragement w/ oral intake.  Medication Administration: Crushed with puree (for safer swallowing)    Other  Recommendations Recommended Consults: Consider GI evaluation;Consider esophageal assessment (d/t pt's c/o dry heaves and "stuck" feelings w/ foods) Oral Care Recommendations: Oral care BID;Oral care before and after PO;Staff/trained caregiver to  provide oral care (support pt) Other Recommendations:  (n/a)    Recommendations for follow up therapy are one component of a multi-disciplinary discharge planning process, led by the attending physician.  Recommendations may be updated based on patient status, additional functional criteria and insurance authorization.  Follow up Recommendations Follow physician's recommendations for discharge plan and follow up therapies (No SLP f/u indicated at this time)      Assistance Recommended at Discharge Frequent or constant Supervision/Assistance  Functional Status Assessment Patient has had a recent decline in their functional status and/or demonstrates limited ability to make significant improvements in function in a reasonable and predictable amount of time  Frequency and Duration  (n/a)   (n/a)       Prognosis Prognosis for Safe Diet Advancement: Fair Barriers to Reach Goals: Motivation;Time post onset;Severity of deficits;Behavior      Swallow Study   General Date of Onset: 06/08/21 HPI: GABRYELLA MURFIN, 76 y.o. 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  Per pt's family, pt has been slowly declining over the past 2 weeks d/t decreased oral intake.  As per daughter patient has had difficulty for several weeks with dry heaves after eating solid food, episodes of confusion with hallucination, and complaining of feeling something stuck in the throat when swallowing solid food.  Head CT negative for acute abnormalities though "Cerebral Atrophy, Chronic atrophy and white matter microvascular disease";  CXR on 06/20/2021 revealed new patchy infiltrate in right lower lung fields suggesting  atelectasis/pneumonia. There are transverse linear densities in the  left parahilar region suggesting scarring or subsegmental  atelectasis or lung nodules. Type of Study: Bedside Swallow Evaluation Previous Swallow  Assessment: BSE on 06/09/2021, txs -- see chart notes Diet Prior to this Study: NPO;NG Tube (currently. Pt had previously been on a full liquid diet but w/ poor oral intake as at Baseline.) Temperature Spikes Noted: No (wbc 4.4) Respiratory Status: Room air History of Recent Intubation: No Behavior/Cognition: Alert;Cooperative;Pleasant mood;Requires cueing (min; brief interactions and engagement) Oral Cavity Assessment: Within Functional Limits (lingual extension; lips) Oral Care Completed by SLP: Recent completion by staff Vision: Functional for self-feeding Self-Feeding Abilities: Able to feed self;Needs set up (sat herself upright on her side in the bed) Patient Positioning: Postural control adequate for testing Baseline Vocal Quality: Normal Volitional Cough: Strong Volitional Swallow: Able to elicit    Oral/Motor/Sensory Function Overall Oral Motor/Sensory Function: Within functional limits (for functional lingual/labial movements during bolus intake and request)   Ice Chips Ice chips: Not tested   Thin Liquid Thin Liquid: Within functional limits Presentation: Self Fed;Straw (3 trials) Other Comments:  (this Clinician sat beside pt and asked her about a few sips of thin liquids giving her a choice of juices or water -- she picked water then consumed 3 sips. after, she stated she did not want anymore.)    Nectar Thick Nectar Thick Liquid: Not tested   Honey Thick Honey Thick Liquid: Not tested   Puree Puree: Not tested   Solid     Solid: Not tested       Orinda Kenner, MS, CCC-SLP Speech Language Pathologist Rehab Services; [place] [phone] (ascom) Chanelle Hodsdon 06/22/2021,3:08 PM

## 2021-06-22 NOTE — Consult Note (Signed)
°  ECT consult: Following up on treatment plan.  After discussion with anesthesia service today we elected to defer ECT treatment because of the patient's recent diagnosis of pneumonia.  Anesthesia of feels it would be safer to wait a couple more days of treatment.  I spoke with the daughter and told her this update.  We are still planning on ECT treatment and I will have her on the schedule for Monday.

## 2021-06-23 ENCOUNTER — Inpatient Hospital Stay: Payer: Medicare Other

## 2021-06-23 LAB — BPAM RBC
Blood Product Expiration Date: 202303012359
Blood Product Expiration Date: 202303012359
ISSUE DATE / TIME: 202301252245
Unit Type and Rh: 5100
Unit Type and Rh: 5100

## 2021-06-23 LAB — CBC WITH DIFFERENTIAL/PLATELET
Abs Immature Granulocytes: 0.1 10*3/uL — ABNORMAL HIGH (ref 0.00–0.07)
Basophils Absolute: 0 10*3/uL (ref 0.0–0.1)
Basophils Relative: 1 %
Eosinophils Absolute: 0.1 10*3/uL (ref 0.0–0.5)
Eosinophils Relative: 2 %
HCT: 26.1 % — ABNORMAL LOW (ref 36.0–46.0)
Hemoglobin: 8.4 g/dL — ABNORMAL LOW (ref 12.0–15.0)
Immature Granulocytes: 3 %
Lymphocytes Relative: 30 %
Lymphs Abs: 1.2 10*3/uL (ref 0.7–4.0)
MCH: 29.9 pg (ref 26.0–34.0)
MCHC: 32.2 g/dL (ref 30.0–36.0)
MCV: 92.9 fL (ref 80.0–100.0)
Monocytes Absolute: 0.3 10*3/uL (ref 0.1–1.0)
Monocytes Relative: 7 %
Neutro Abs: 2.4 10*3/uL (ref 1.7–7.7)
Neutrophils Relative %: 57 %
Platelets: 180 10*3/uL (ref 150–400)
RBC: 2.81 MIL/uL — ABNORMAL LOW (ref 3.87–5.11)
RDW: 14.8 % (ref 11.5–15.5)
WBC: 4 10*3/uL (ref 4.0–10.5)
nRBC: 0 % (ref 0.0–0.2)

## 2021-06-23 LAB — URINE CULTURE: Culture: 70000 — AB

## 2021-06-23 LAB — COMPREHENSIVE METABOLIC PANEL
ALT: 27 U/L (ref 0–44)
AST: 28 U/L (ref 15–41)
Albumin: 1.8 g/dL — ABNORMAL LOW (ref 3.5–5.0)
Alkaline Phosphatase: 58 U/L (ref 38–126)
Anion gap: 5 (ref 5–15)
BUN: 18 mg/dL (ref 8–23)
CO2: 23 mmol/L (ref 22–32)
Calcium: 8.8 mg/dL — ABNORMAL LOW (ref 8.9–10.3)
Chloride: 110 mmol/L (ref 98–111)
Creatinine, Ser: 0.9 mg/dL (ref 0.44–1.00)
GFR, Estimated: >60 mL/min (ref >60)
Glucose, Bld: 118 mg/dL — ABNORMAL HIGH (ref 70–99)
Potassium: 3.8 mmol/L (ref 3.5–5.1)
Sodium: 138 mmol/L (ref 135–145)
Total Bilirubin: 0.4 mg/dL (ref 0.3–1.2)
Total Protein: 4.5 g/dL — ABNORMAL LOW (ref 6.5–8.1)

## 2021-06-23 LAB — TYPE AND SCREEN
ABO/RH(D): O POS
Antibody Screen: NEGATIVE
Unit division: 0
Unit division: 0

## 2021-06-23 LAB — PHOSPHORUS: Phosphorus: 2.9 mg/dL (ref 2.5–4.6)

## 2021-06-23 LAB — GLUCOSE, CAPILLARY
Glucose-Capillary: 107 mg/dL — ABNORMAL HIGH (ref 70–99)
Glucose-Capillary: 107 mg/dL — ABNORMAL HIGH (ref 70–99)
Glucose-Capillary: 113 mg/dL — ABNORMAL HIGH (ref 70–99)
Glucose-Capillary: 114 mg/dL — ABNORMAL HIGH (ref 70–99)
Glucose-Capillary: 115 mg/dL — ABNORMAL HIGH (ref 70–99)
Glucose-Capillary: 116 mg/dL — ABNORMAL HIGH (ref 70–99)
Glucose-Capillary: 118 mg/dL — ABNORMAL HIGH (ref 70–99)

## 2021-06-23 LAB — MAGNESIUM: Magnesium: 2.2 mg/dL (ref 1.7–2.4)

## 2021-06-23 MED ORDER — AMOXICILLIN-POT CLAVULANATE 875-125 MG PO TABS
1.0000 | ORAL_TABLET | Freq: Two times a day (BID) | ORAL | Status: DC
  Administered 2021-06-23 – 2021-06-26 (×6): 1 via ORAL
  Filled 2021-06-23 (×6): qty 1

## 2021-06-23 MED ORDER — VANCOMYCIN HCL 125 MG PO CAPS
125.0000 mg | ORAL_CAPSULE | Freq: Four times a day (QID) | ORAL | Status: DC
  Administered 2021-06-23 – 2021-06-25 (×7): 125 mg via ORAL
  Filled 2021-06-23 (×9): qty 1

## 2021-06-23 NOTE — Plan of Care (Signed)
Cdiff infection -start vancomycin PO qid x 10 days  #Pseudomonas + Urine Cx  -would not recommend treating as pt is asymptomatic

## 2021-06-23 NOTE — Consult Note (Signed)
Pharmacy Antibiotic Note  Amber [name] is a 76 y.o. female admitted on 06/19/2021 with C. Difficile infection -- first occurrence, non-fulminant.  Pharmacy has been consulted for Vancomycin dosing.  Plan: Vancomycin PO 125 mg capsules QID x 10 days   Height: 5\' 6"  (167.6 cm) Weight: 72.7 kg (160 lb 4.4 oz) IBW/kg (Calculated) : 59.3  Temp (24hrs), Avg:98 F (36.7 C), Min:97.7 F (36.5 C), Max:98.4 F (36.9 C)  Recent Labs  Lab 06/20/21 0806 06/20/21 1643 06/21/21 0940 06/22/21 0656 06/23/21 0633  WBC 7.0 6.5 6.6 4.4 4.0  CREATININE 1.23* 1.19* 1.06* 0.96 0.90    Estimated Creatinine Clearance: 56.9 mL/min (by C-G formula based on SCr of 0.9 mg/dL).    Allergies  Allergen Reactions   Shellfish Allergy     "I pass out"    Antimicrobials this admission: 1/25 Unasyn >>   Dose adjustments this admission:   Microbiology results: 1/25 BCx: NGTD 1/26 UCx: 70,000 COLONIES/mL PSEUDOMONAS AERUGINOSA -- asymptomatic   1/26 Stool. C. Diff Ag+/Toxin+   Thank you for allowing pharmacy to be a part of this patients care.  Dorothe Pea, PharmD, BCPS Clinical Pharmacist   06/23/2021 3:47 PM

## 2021-06-23 NOTE — Progress Notes (Signed)
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 Weber Cooks of psychiatry.  She was admitted to [facility] from 06/08/21-06/19/21 after presenting with failure to thrive following a several week history of complaints of difficulty swallowing with decreased oral intake and subsequent development of confusion and hallucinations.  She had outpatient work-up include brain MRI as well as MRV which was unremarkable.  On admission she is found to have an AKI and several electrolyte abnormalities.  While at [facility] she refused to eat and refused further evaluation with esophagram to evaluate her sensation of dysphagia.  She is evaluated by psychiatry who determined her to have capacity for medical decision-making and so an NG tube was placed for force-feeding.  Palliative care consult was also done.  Due to her poor response to oral antidepressants ECG was recommended and thus arranged to transfer to [facility].  On 06/08/2020 she tested positive for COVID and she received 3 days of remdesivir.  Most of her nutritional deficiencies related to inadequate oral intake were corrected prior to transfer and on the day of discharge she developed acute urinary tension a Foley catheter was placed and it was traumatic insertion with gross development of gross hematuria.  Hemoglobin did drop to 7.0 and repeat is pending.   On the afternoon of 06/21/21 she spiked a temperature of 101.9 and became tachycardic and slightly hypotensive.  She is given IV fluid bolus and she is pancultured.  Further work-up revealed that she had a right lower lobe  pneumonia so she was started on IV Unasyn.  She had some loose stools and this was tested for C. difficile however we can consider C. difficile colonization given that she no longer spiking temperatures and afebrile and has no leukocytosis.  SLP evaluated and recommending a clear liquid diet with thins in addition to an NG tube.  Given that she has a recent pneumonia and anesthesia felt it would be safer to wait a couple more days and for ECT treatment until that time to help clear her pneumonia.  Tentatively she is now scheduled for Monday, 06/25/2021.  We will change her antibiotics from IV Unasyn to p.o. Augmentin.  Given that she continues to have some mild loose stools I spoke with ID who feels that since the patient was positive toxin producing C. difficile empirically treat with vancomycin for 10 days.  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[place]) Active Problems:   Moderate protein-calorie malnutrition ([place])   Primary hyperparathyroidism ([place])   Dysphagia   COVID-19 virus infection 06/08/2021   Nasogastric tube present   Chronic deep vein thrombosis (DVT) (HCC)   Chronic anticoagulation   ABLA (acute blood loss anemia)   Acute urinary retention   Foley catheter in place   Stage 3a chronic kidney disease (HCC)   At high risk for inadequate nutritional intake   Gross hematuria   Fever   Aspiration pneumonia of right lung due to gastric secretions (HCC)  Psychotic depression placing patient at high nutritional risk  Need for electroconvulsive therapy -Psych consult to evaluate for ECT Per arrangements  made prior to transfer -Continue mirtazapine 7.5 mg per tube via NGT -Further care per psychiatry and they are planning ECT for 06/22/21 however this has been deferred until Monday, 06/25/2021   Inadequate oral nutritional intake  Nutritional deficiencies requiring NGT for force-feeding Moderate protein-calorie malnutrition ([place]) Dysphagia of unknown  etiology -Nutritionist consult for tube feeds - Continue nutritional supplementation via NG tube - Patient has refused further evaluation of dysphagia to evaluate for organic etiology -She does not have the capacity so we will need to get SLP involved again -Continue with Osmolite per nutritionist recommendations and I have started the patient on clears -Have also consulted speech who recommended clear liquid diet and advancing diet once the CT is completed  Sepsis 2/2 Aspiration PNA  -Became febrile and tachycardic this afternoon as well as hypotensive slightly; likely could have aspirated with her NG tube in place -Initiate IV Unasyn and will continue and she received 3 days of antibiotics and will change to p.o. Augmentin for final 4 more days; her fevers have since resolved and WBC is improved -Given IV fluid boluses 2 L and we will panculture and obtain blood cultures x2, urinalysis and urine culture as well as chest x-ray -Chest x-ray done and showed "There is new patchy infiltrate in right lower lung fields suggesting atelectasis/pneumonia. There are transverse linear densities in the left parahilar region suggesting scarring or subsegmental atelectasis or lung nodules." -We will order flutter valve and incentive spirometer -C. difficile was checked and she is positive for toxin producing C. difficile however given that she does not have a white count we will not currently treat as we believe this may be a colonization -Remains significantly withdrawn but not complaining of any shortness of breath -Repeat chest x-ray today shows "1.7 x 0.8 cm left upper lobe opacity concerning for nodule. Chest CT recommended, preferably with contrast. No appreciable focal pneumonia. Cardiomegaly." -Will obtain CT of the Chest with Contrast in the AM   Suspected Lung Nodule -As Above noted to be 1.7 x 0.8 cm Left Upper Lobe Opacity -As above we will get CT scan of the chest with contrast  C. Difficile  Diarrhea -Clinically she does not really have signs and symptoms for C. difficile given that she is afebrile now and has no leukocytosis -The tube feedings are likely causing the patient's loose stools however we cannot confirm the patient has actual C. Difficile as we do not have PCR testing here  -ID recommends just treating empirically with vancomycin 1.5 mg p.o. 4 times daily for 10 days  Bacteriuria and pyuria without evidence of infection -When she spiked a temperature few days ago we ordered a urinalysis which showed a hazy appearance with moderate hemoglobin, moderate leukocytes, negative nitrites, rare bacteria, 21-50 RBCs, no squamous epithelial cells, greater than 50 WBCs -Urine culture showed 70,000 colony-forming units of Pseudomonas which which was pansensitive -Will not treat her Pseudomonas UTI as she has no symptoms and will remove her Foley catheter  Thrombocytopenia -Patient's platelet count went from 181 ->144 -> 132 -> 151 -> 180 -Has been bleeding as her Foley catheter -Continue monitor for signs and symptoms bleeding; currently her Foley has some darker red blood in it but this is improving -Repeat CBC in the a.m.   Acute urinary retention Foley catheter in place - Foley catheter was inserted but now removed to do a trial of void and move Foley catheter --Continue Tamsulosin   ABLA (acute blood loss anemia) 1/24 Gross hematuria secondary to  traumatic Foley -Hemoglobin continued to drop and trended down from 9.2/28.3 is now 6.7/21.0 so she was typed and screened and transfused 2 units of PRBCs and went to 8.2/25.4 -> 8.0/24.1 -> 8.1/25.0 and is now 8.4/26.1 with no other signs of bleeding -She is typed and screened and transfused 1 unit of PRBCs -Continued to trend H&H and transfuse if necessary(daughter consented via telephone) -One time NS bolus ordered for another few boluses as above -CBI may be necessary -Resumed anticoagulation with Eliquis   Chronic  anticoagulation due to chronic distal femoral DVT - Initially was holding Eliquis and Lovenox at this time given her hemoglobin drop -SCDs for DVT prophylaxis -Resumed systemic anticoagulation with Eliquis   COVID-19 virus infection 06/08/2021 - Not symptomatic -Completed remdesivir for 3-day -We will treat her aspiration pneumonia as above   Stage 3a chronic kidney disease ([place]) Primary hyperparathyroidism/vitamin D deficiency - Continue supplementation via NG tube -Patient's BUNs/creatinine went from 34/1.14 -> 29/1.19 -> 36/1.23 -> 35/1.19 -> 29/1.06 -> 22/0/96 and is now 18/0.90 -Avoid further nephrotoxic medications, contrast dyes, hypotension and dehydration and renally dose medications -Repeat CMP in the AM   Severe malnutrition related to chronic illness -Evidence by percent weight loss, severe fat depletion and severe muscle depletion -Nutritionist consulted for further evaluation recommendations and recommended initiating tube feedings via NG tube Osmolite 1.2 at 65 MLS per hour with 100 mL of free water flushes every 4 hours and her liquid multivitamin was discontinued and they recommended crushing multivitamin with minerals per tube daily  DVT prophylaxis: SCDs Code Status: DO NOT RESUSCITATE Family Communication: No family currently at bedside Disposition Plan: Pending further clinical improvement and clearance by psychiatry  Status is: Inpatient  Remains inpatient appropriate because: She is spiking temperatures and will be getting ECT on Friday   Consultants:  Psychiatry  Discussed with ID Dr. Candiss Norse  Procedures: None  Antimicrobials:  Anti-infectives (From admission, onward)    Start     Dose/Rate Route Frequency Ordered Stop   06/20/21 1900  Ampicillin-Sulbactam (UNASYN) 3 g in sodium chloride 0.9 % 100 mL IVPB        3 g 200 mL/hr over 30 Minutes Intravenous Every 8 hours 06/20/21 1812          Subjective: Seen and examined at bedside and she was doing  okay and complained of some mild abdominal discomfort but states that it is okay.  Denies any chest pain or shortness of breath.  Feels okay.  No nausea vomiting and having some loose bowel movements but only having 1-2 bowel movements a day.  No lightheadedness or dizziness.  States that she does not want to do anything today and states that she just wants to rest.  No other concerns or complaints time.  Objective: Vitals:   06/22/21 1948 06/23/21 0046 06/23/21 0600 06/23/21 0742  BP: (!) 141/86 136/88 (!) 143/78 (!) 146/89  Pulse: (!) 101 (!) 108 (!) 102 97  Resp: 18 18 18 17   Temp: 97.9 F (36.6 C) 98.4 F (36.9 C) 97.7 F (36.5 C) 97.9 F (36.6 C)  TempSrc: Oral   Oral  SpO2: 100% 100%  98%  Weight:      Height:        Intake/Output Summary (Last 24 hours) at 06/23/2021 0826 Last data filed at 06/22/2021 1500 Gross per 24 hour  Intake 262.12 ml  Output --  Net 262.12 ml    Filed Weights   06/20/21 0512 06/20/21 0513 06/21/21 0112  Weight:  66.4 kg 66.4 kg 72.7 kg   Examination: Physical Exam:  Constitutional: WN/WD Caucasian female currently in no acute distress laying in bed resting Eyes: Lids and conjunctivae normal, sclerae anicteric  ENMT: External Ears, Nose appear normal. Grossly normal hearing. Mucous membranes are moist. Cortrak in place  Neck: Appears normal, supple, no cervical masses, normal ROM, no appreciable thyromegaly; no appreciable JVD Respiratory: Diminished to auscultation bilaterally with coarse breath sounds, no wheezing, rales, rhonchi or crackles. Normal respiratory effort and patient is not tachypenic. No accessory muscle use.  Unlabored breathing Cardiovascular: RRR, no murmurs / rubs / gallops. S1 and S2 auscultated.  Has mild lower extremity edema Abdomen: Soft, very slightly-tender, non-distended. Bowel sounds positive.  GU: Deferred. Musculoskeletal: No clubbing / cyanosis of digits/nails. No joint deformity upper and lower extremities.  Skin:  No rashes, lesions, ulcers on limited skin evaluation. No induration; Warm and dry.  Neurologic: CN 2-12 grossly intact with no focal deficits. Romberg sign and cerebellar reflexes not assessed.  Psychiatric: Normal judgment and insight. Alert and oriented x 3. Depressed mood and Flat affect.   Data Reviewed: I have personally reviewed following labs and imaging studies  CBC: Recent Labs  Lab 06/20/21 0806 06/20/21 1643 06/21/21 0242 06/21/21 0940 06/22/21 0656 06/23/21 0633  WBC 7.0 6.5  --  6.6 4.4 4.0  NEUTROABS 5.4 5.1  --  4.8 2.6 2.4  HGB 7.0* 6.7* 8.2* 8.0* 8.1* 8.4*  HCT 21.8* 21.0* 25.4* 24.1* 25.0* 26.1*  MCV 94.4 93.8  --  92.7 93.3 92.9  PLT 149* 144*  --  132* 151 885    Basic Metabolic Panel: Recent Labs  Lab 06/20/21 0806 06/20/21 1314 06/20/21 1643 06/21/21 0242 06/21/21 0940 06/21/21 1705 06/22/21 0656 06/23/21 0633  NA 137  --  135  --  138  --  137 138  K 4.2  --  4.1  --  4.1  --  3.9 3.8  CL 110  --  108  --  111  --  109 110  CO2 22  --  22  --  23  --  22 23  GLUCOSE 129*  --  110*  --  101*  --  86 118*  BUN 36*  --  35*  --  29*  --  22 18  CREATININE 1.23*  --  1.19*  --  1.06*  --  0.96 0.90  CALCIUM 9.0  --  8.8*  --  8.6*  --  8.9 8.8*  MG 2.0   < > 1.9 1.8 1.8 1.8 1.7 2.2  PHOS 2.9   < > 2.7 2.5 2.6 2.7 2.8 2.9   < > = values in this interval not displayed.   GFR: Estimated Creatinine Clearance: 56.9 mL/min (by C-G formula based on SCr of 0.9 mg/dL). Liver Function Tests: Recent Labs  Lab 06/20/21 0806 06/20/21 1643 06/21/21 0940 06/22/21 0656 06/23/21 0633  AST 16 16 19 29 28   ALT 17 15 18 24 27   ALKPHOS 48 50 53 56 58  BILITOT 0.4 0.5 0.5 0.5 0.4  PROT 4.3* 4.1* 4.1* 4.2* 4.5*  ALBUMIN 1.8* 1.7* 1.7* 1.7* 1.8*    No results for input(s): LIPASE, AMYLASE in the last 168 hours. Recent Labs  Lab 06/18/21 0835  AMMONIA 13    Coagulation Profile: No results for input(s): INR, PROTIME in the last 168 hours. Cardiac  Enzymes: No results for input(s): CKTOTAL, CKMB, CKMBINDEX, TROPONINI in the last 168 hours. BNP (last 3 results)  No results for input(s): PROBNP in the last 8760 hours. HbA1C: No results for input(s): HGBA1C in the last 72 hours. CBG: Recent Labs  Lab 06/22/21 1612 06/22/21 1958 06/23/21 0051 06/23/21 0558 06/23/21 0744  GLUCAP 117* 124* 118* 116* 115*    Lipid Profile: No results for input(s): CHOL, HDL, LDLCALC, TRIG, CHOLHDL, LDLDIRECT in the last 72 hours. Thyroid Function Tests: No results for input(s): TSH, T4TOTAL, FREET4, T3FREE, THYROIDAB in the last 72 hours. Anemia Panel: No results for input(s): VITAMINB12, FOLATE, FERRITIN, TIBC, IRON, RETICCTPCT in the last 72 hours.  Sepsis Labs: No results for input(s): PROCALCITON, LATICACIDVEN in the last 168 hours.  Recent Results (from the past 240 hour(s))  CULTURE, BLOOD (ROUTINE X 2) w Reflex to ID Panel     Status: None (Preliminary result)   Collection Time: 06/20/21  4:42 PM   Specimen: BLOOD RIGHT ARM  Result Value Ref Range Status   Specimen Description BLOOD RIGHT ARM  Final   Special Requests   Final    BOTTLES DRAWN AEROBIC AND ANAEROBIC Blood Culture adequate volume   Culture   Final    NO GROWTH 3 DAYS Performed at [facility], [address] [place], [place] [address]    Report Status PENDING  Incomplete  CULTURE, BLOOD (ROUTINE X 2) w Reflex to ID Panel     Status: None (Preliminary result)   Collection Time: 06/20/21  4:53 PM   Specimen: BLOOD RIGHT HAND  Result Value Ref Range Status   Specimen Description BLOOD RIGHT HAND  Final   Special Requests   Final    BOTTLES DRAWN AEROBIC AND ANAEROBIC Blood Culture adequate volume   Culture   Final    NO GROWTH 3 DAYS Performed at [facility], [address] [place], [place] [address]    Report Status PENDING  Incomplete  Urine Culture     Status: Abnormal (Preliminary result)   Collection Time: 06/21/21  7:30 AM   Specimen:  Urine, Random  Result Value Ref Range Status   Specimen Description   Final    URINE, RANDOM Performed at [facility], [address], [place], [place] [address]    Special Requests   Final    NONE Performed at [facility], [place], [place] [address]    Culture 70,000 COLONIES/mL PSEUDOMONAS AERUGINOSA (A)  Final   Report Status PENDING  Incomplete  C Difficile Quick Screen w PCR reflex     Status: Abnormal   Collection Time: 06/21/21  1:48 PM   Specimen: STOOL  Result Value Ref Range Status   C Diff antigen POSITIVE (A) NEGATIVE Final   C Diff toxin POSITIVE (A) NEGATIVE Final   C Diff interpretation Toxin producing C. difficile detected.  Final    Comment: CRITICAL RESULT CALLED TO, READ BACK BY AND VERIFIED WITH: [name] HUSKNIS 06/21/21 1455 Performed at [facility], [address] [place], [place] [address]      RN Pressure Injury Documentation:     Estimated body mass index is 25.87 kg/m as calculated from the following:   Height as of this encounter: 5\' 6"  (1.676 m).   Weight as of this encounter: 72.7 kg.  Malnutrition Type: Nutrition Problem: Severe Malnutrition Etiology: chronic illness (major depressive disorder) Malnutrition Characteristics: Signs/Symptoms: percent weight loss, severe fat depletion, severe muscle depletion Percent weight loss: 6.5 % (x2 months) Nutrition Interventions: Interventions: Tube feeding Radiology Studies: DG Chest Port 1 View  Result Date:  06/23/2021 CLINICAL DATA:  Shortness of breath. EXAM: PORTABLE CHEST 1 VIEW COMPARISON:  Portable chest 06/20/2021. FINDINGS: 4:15 a.m., 06/23/2021. Cardiomegaly. The central vessels are normal in caliber. Stable mediastinum with aortic tortuosity and ectasia. The lungs are clear of focal infiltrates, with linear scarring or atelectasis left mid field again noted. There is an irregular left upper lobe opacity measuring 1.7 x 0.8 cm. In retrospect this  may have been present on earlier studies. Findings concerning for pulmonary nodule. Chest CT recommended preferably with contrast. No pleural effusion is seen. Osteopenia thoracic spondylosis. Feeding tube enters the stomach. IMPRESSION: 1.7 x 0.8 cm left upper lobe opacity concerning for nodule. Chest CT recommended, preferably with contrast. No appreciable focal pneumonia. Cardiomegaly. Electronically Signed   By: Telford Nab M.D.   On: 06/23/2021 06:06    Scheduled Meds:  sodium chloride   Intravenous Once   apixaban  5 mg Oral BID   Chlorhexidine Gluconate Cloth  6 each Topical Daily   cyanocobalamin  1,000 mcg Intramuscular Daily   free water  100 mL Per Tube Q4H   haloperidol lactate  0.5 mg Intravenous QHS   mirtazapine  7.5 mg Per Tube QHS   multivitamin with minerals  1 tablet Per Tube Daily   pantoprazole (PROTONIX) IV  40 mg Intravenous Daily   sodium bicarbonate  650 mg Per Tube BID   tamsulosin  0.4 mg Oral QPC supper   thiamine  100 mg Oral Daily   Vitamin D (Ergocalciferol)  50,000 Units Oral Q7 days   Continuous Infusions:  ampicillin-sulbactam (UNASYN) IV 3 g (06/23/21 0545)   feeding supplement (OSMOLITE 1.2 CAL) 1,000 mL (06/22/21 1229)   thiamine 500mg  in normal saline (56ml) IVPB 500 mg (06/22/21 2149)    LOS: 4 days   Kerney Elbe, DO [facility] PAGER is on AMION  If 7PM-7AM, please contact night-coverage [email or web address]

## 2021-06-24 ENCOUNTER — Inpatient Hospital Stay: Payer: Medicare Other

## 2021-06-24 LAB — COMPREHENSIVE METABOLIC PANEL
ALT: 28 U/L (ref 0–44)
AST: 24 U/L (ref 15–41)
Albumin: 2 g/dL — ABNORMAL LOW (ref 3.5–5.0)
Alkaline Phosphatase: 59 U/L (ref 38–126)
Anion gap: 3 — ABNORMAL LOW (ref 5–15)
BUN: 17 mg/dL (ref 8–23)
CO2: 23 mmol/L (ref 22–32)
Calcium: 8.8 mg/dL — ABNORMAL LOW (ref 8.9–10.3)
Chloride: 112 mmol/L — ABNORMAL HIGH (ref 98–111)
Creatinine, Ser: 0.96 mg/dL (ref 0.44–1.00)
GFR, Estimated: >60 mL/min (ref >60)
Glucose, Bld: 108 mg/dL — ABNORMAL HIGH (ref 70–99)
Potassium: 4.2 mmol/L (ref 3.5–5.1)
Sodium: 138 mmol/L (ref 135–145)
Total Bilirubin: 0.4 mg/dL (ref 0.3–1.2)
Total Protein: 4.8 g/dL — ABNORMAL LOW (ref 6.5–8.1)

## 2021-06-24 LAB — CBC WITH DIFFERENTIAL/PLATELET
Abs Immature Granulocytes: 0.08 10*3/uL — ABNORMAL HIGH (ref 0.00–0.07)
Basophils Absolute: 0 10*3/uL (ref 0.0–0.1)
Basophils Relative: 0 %
Eosinophils Absolute: 0.1 10*3/uL (ref 0.0–0.5)
Eosinophils Relative: 1 %
HCT: 26.6 % — ABNORMAL LOW (ref 36.0–46.0)
Hemoglobin: 8.6 g/dL — ABNORMAL LOW (ref 12.0–15.0)
Immature Granulocytes: 2 %
Lymphocytes Relative: 18 %
Lymphs Abs: 1 10*3/uL (ref 0.7–4.0)
MCH: 29.4 pg (ref 26.0–34.0)
MCHC: 32.3 g/dL (ref 30.0–36.0)
MCV: 90.8 fL (ref 80.0–100.0)
Monocytes Absolute: 0.3 10*3/uL (ref 0.1–1.0)
Monocytes Relative: 6 %
Neutro Abs: 3.9 10*3/uL (ref 1.7–7.7)
Neutrophils Relative %: 73 %
Platelets: 222 10*3/uL (ref 150–400)
RBC: 2.93 MIL/uL — ABNORMAL LOW (ref 3.87–5.11)
RDW: 14.7 % (ref 11.5–15.5)
WBC: 5.3 10*3/uL (ref 4.0–10.5)
nRBC: 0 % (ref 0.0–0.2)

## 2021-06-24 LAB — MAGNESIUM: Magnesium: 2.3 mg/dL (ref 1.7–2.4)

## 2021-06-24 LAB — GLUCOSE, CAPILLARY
Glucose-Capillary: 101 mg/dL — ABNORMAL HIGH (ref 70–99)
Glucose-Capillary: 103 mg/dL — ABNORMAL HIGH (ref 70–99)
Glucose-Capillary: 116 mg/dL — ABNORMAL HIGH (ref 70–99)
Glucose-Capillary: 118 mg/dL — ABNORMAL HIGH (ref 70–99)
Glucose-Capillary: 98 mg/dL (ref 70–99)
Glucose-Capillary: 98 mg/dL (ref 70–99)

## 2021-06-24 LAB — PHOSPHORUS: Phosphorus: 3.4 mg/dL (ref 2.5–4.6)

## 2021-06-24 MED ORDER — SODIUM CHLORIDE 0.9 % IV BOLUS
1000.0000 mL | Freq: Once | INTRAVENOUS | Status: AC
  Administered 2021-06-24: 1000 mL via INTRAVENOUS

## 2021-06-24 MED ORDER — IOHEXOL 300 MG/ML  SOLN
75.0000 mL | Freq: Once | INTRAMUSCULAR | Status: AC | PRN
  Administered 2021-06-24: 75 mL via INTRAVENOUS

## 2021-06-24 NOTE — Progress Notes (Signed)
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 Weber Cooks of psychiatry.  She was admitted to [facility] from 06/08/21-06/19/21 after presenting with failure to thrive following a several week history of complaints of difficulty swallowing with decreased oral intake and subsequent development of confusion and hallucinations.  She had outpatient work-up include brain MRI as well as MRV which was unremarkable.  On admission she is found to have an AKI and several electrolyte abnormalities.  While at [facility] she refused to eat and refused further evaluation with esophagram to evaluate her sensation of dysphagia.  She is evaluated by psychiatry who determined her to have capacity for medical decision-making and so an NG tube was placed for force-feeding.  Palliative care consult was also done.  Due to her poor response to oral antidepressants ECG was recommended and thus arranged to transfer to [facility].  On 06/08/2020 she tested positive for COVID and she received 3 days of remdesivir.  Most of her nutritional deficiencies related to inadequate oral intake were corrected prior to transfer and on the day of discharge she developed acute urinary tension a Foley catheter was placed and it was traumatic insertion with gross development of gross hematuria.  Hemoglobin did drop to 7.0 and repeat is pending.   On the afternoon of 06/21/21 she spiked a temperature of 101.9 and became tachycardic and slightly hypotensive.  She is given IV fluid bolus and she is pancultured.  Further work-up revealed that she had a right lower lobe  pneumonia so she was started on IV Unasyn.  She had some loose stools and this was tested for C. difficile however we can consider C. difficile colonization given that she no longer spiking temperatures and afebrile and has no leukocytosis.  SLP evaluated and recommending a clear liquid diet with thins in addition to an NG tube.  Given that she has a recent pneumonia and anesthesia felt it would be safer to wait a couple more days and for ECT treatment until that time to help clear her pneumonia.  Tentatively she is now scheduled for Monday, 06/25/2021.  We will change her antibiotics from IV Unasyn to p.o. Augmentin for her Pneunmonia  Given that she continues to have some mild loose stools I spoke with ID who feels that since the patient was positive toxin producing C. difficile empirically treat with vancomycin for 10 days.  Patient has only had 1 bowel movement today and her Foley catheter was removed yesterday.  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[place]) Active Problems:   Moderate protein-calorie malnutrition ([place])   Primary hyperparathyroidism ([place])   Dysphagia   COVID-19 virus infection 06/08/2021   Nasogastric tube present   Chronic deep vein thrombosis (DVT) (HCC)   Chronic anticoagulation   ABLA (acute blood loss anemia)   Acute urinary retention   Foley catheter in place   Stage 3a chronic kidney disease ([place])   At high risk for inadequate nutritional intake   Gross hematuria   Fever   Aspiration pneumonia of right lung due to gastric secretions (HCC)  Psychotic depression  placing patient at high nutritional risk  Need for electroconvulsive therapy -Psych consult to evaluate for ECT Per arrangements made prior to transfer -Continue mirtazapine 7.5 mg per tube via NGT -Further care per psychiatry and they are planning ECT for 06/22/21 however this has been deferred until Monday, 06/25/2021   Inadequate oral nutritional intake  Nutritional deficiencies  requiring NGT for force-feeding Moderate protein-calorie malnutrition ([place]) Dysphagia of unknown etiology -Nutritionist consult for tube feeds - Continue nutritional supplementation via NG tube - Patient has refused further evaluation of dysphagia to evaluate for organic etiology -She does not have the capacity so we will need to get SLP involved again -Continue with Osmolite per nutritionist recommendations and I have started the patient on clears -Have also consulted speech who recommended clear liquid diet and advancing diet once the CT is completed  Sepsis 2/2 Aspiration PNA  -Became febrile and tachycardic this afternoon as well as hypotensive slightly; likely could have aspirated with her NG tube in place -Initiate IV Unasyn and will continue and she received 3 days of antibiotics and will change to p.o. Augmentin for final 4 more days; her fevers have since resolved and WBC is improved -Given IV fluid boluses 2 L and we will panculture and obtain blood cultures x2, urinalysis and urine culture as well as chest x-ray -Chest x-ray done and showed "There is new patchy infiltrate in right lower lung fields suggesting atelectasis/pneumonia. There are transverse linear densities in the left parahilar region suggesting scarring or subsegmental atelectasis or lung nodules." -We will order flutter valve and incentive spirometer -C. difficile was checked and she is positive for toxin producing C. difficile however given that she does not have a white count we will not currently treat as we believe this may be a colonization -Remains significantly withdrawn but not complaining of any shortness of breath -Repeat chest x-ray today shows "1.7 x 0.8 cm left upper lobe opacity concerning for nodule. Chest CT recommended, preferably with contrast. No appreciable focal pneumonia. Cardiomegaly." -Will obtain CT of the Chest with Contrast in the AM   Suspected Lung Nodule -As Above noted to be 1.7 x 0.8 cm  Left Upper Lobe Opacity -Will get CT scan of the chest with contrast and this is ordered   C. Difficile Diarrhea -Clinically she does not really have signs and symptoms for C. difficile given that she is afebrile now and has no leukocytosis -The tube feedings are likely causing the patient's loose stools however we cannot confirm the patient has actual C. Difficile as we do not have PCR testing here  -ID recommends just treating empirically with vancomycin 125 mg p.o. 4 times daily for 10 days  Bacteriuria and pyuria without evidence of infection -When she spiked a temperature few days ago we ordered a urinalysis which showed a hazy appearance with moderate hemoglobin, moderate leukocytes, negative nitrites, rare bacteria, 21-50 RBCs, no squamous epithelial cells, greater than 50 WBCs -Urine culture showed 70,000 colony-forming units of Pseudomonas which which was pansensitive -Will not treat her Pseudomonas UTI as she has no symptoms and will remove her Foley catheter -Foley catheter has been removed  Thrombocytopenia -Patient's platelet count went from 181 ->144 -> 132 -> 151 -> 180 -> 222 -Has been bleeding in the urine after Foley catheter placement but discharge resolved -Continue monitor for signs and symptoms bleeding; currently her Foley has some darker red blood in it but this is improving -Repeat CBC in the a.m.   Acute urinary retention Foley catheter  in place - Foley catheter was inserted but now removed to do a trial of void and move Foley catheter; Foley catheter has been removed and she has been urinating adequately --Continue Tamsulosin   ABLA (acute blood loss anemia) 1/24 Gross hematuria secondary to traumatic Foley -Hemoglobin continued to drop and trended down from 9.2/28.3 is now 6.7/21.0 so she was typed and screened and transfused 2 units of PRBCs and went to 8.2/25.4 -> 8.0/24.1 -> 8.1/25.0 -> 8.4/26.1 -> 8.6/26.6 with no other signs of bleeding -She is typed and  screened and transfused 1 unit of PRBCs -Continued to trend H&H and transfuse if necessary(daughter consented via telephone) -One time NS bolus ordered for another few boluses as above -CBI may be necessary -Resumed anticoagulation with Eliquis   Chronic anticoagulation due to chronic distal femoral DVT - Initially was holding Eliquis and Lovenox at this time given her hemoglobin drop -SCDs for DVT prophylaxis -Resumed systemic anticoagulation with Eliquis   COVID-19 virus infection 06/08/2021 - Not symptomatic -Completed remdesivir for 3-day -We will treat her aspiration pneumonia as above   Stage 3a chronic kidney disease ([place]) Primary hyperparathyroidism/vitamin D deficiency - Continue supplementation via NG tube -Patient's BUNs/creatinine went from 34/1.14 -> 29/1.19 -> 36/1.23 -> 35/1.19 -> 29/1.06 -> 22/0/96 -> 18/0.90 -> 17/0.96 -Avoid further nephrotoxic medications, contrast dyes, hypotension and dehydration and renally dose medications -Repeat CMP in the AM   Severe malnutrition related to chronic illness -Evidence by percent weight loss, severe fat depletion and severe muscle depletion -Nutritionist consulted for further evaluation recommendations and recommended initiating tube feedings via NG tube Osmolite 1.2 at 65 MLS per hour with 100 mL of free water flushes every 4 hours and her liquid multivitamin was discontinued and they recommended crushing multivitamin with minerals per tube daily  DVT prophylaxis: SCDs Code Status: DO NOT RESUSCITATE Family Communication: No family currently at bedside Disposition Plan: Pending further clinical improvement and clearance by psychiatry  Status is: Inpatient  Remains inpatient appropriate because: She is spiking temperatures and will be getting ECT on Friday   Consultants:  Psychiatry  Discussed with ID Dr. Candiss Norse  Procedures: None  Antimicrobials:  Anti-infectives (From admission, onward)    Start     Dose/Rate Route  Frequency Ordered Stop   06/23/21 2200  amoxicillin-clavulanate (AUGMENTIN) 875-125 MG per tablet 1 tablet        1 tablet Oral Every 12 hours 06/23/21 1701     06/23/21 1600  vancomycin (VANCOCIN) capsule 125 mg        125 mg Oral 4 times daily 06/23/21 1457 07/03/21 1759   06/20/21 1900  Ampicillin-Sulbactam (UNASYN) 3 g in sodium chloride 0.9 % 100 mL IVPB  Status:  Discontinued        3 g 200 mL/hr over 30 Minutes Intravenous Every 8 hours 06/20/21 1812 06/23/21 1701        Subjective: Seen and examined at bedside and states that she is not hungry and did not eat.  Denies any nausea or vomiting and no chest discomfort or abdominal pain.  Did not really want to do anything.  Nursing states that she had 1 bowel movement only today.  No other concerns or complaints at this time.  Objective: Vitals:   06/24/21 0459 06/24/21 0500 06/24/21 0804 06/24/21 1209  BP: 131/86  96/69 128/79  Pulse: (!) 110  (!) 108 (!) 107  Resp: 16  18 15   Temp: 97.9 F (36.6 C)  98.4 F (36.9 C) 98.4 F (  36.9 C)  TempSrc: Oral  Oral Oral  SpO2: 99%  100% 100%  Weight:  63.1 kg    Height:        Intake/Output Summary (Last 24 hours) at 06/24/2021 1438 Last data filed at 06/24/2021 0700 Gross per 24 hour  Intake 0 ml  Output 900 ml  Net -900 ml    Filed Weights   06/20/21 0513 06/21/21 0112 06/24/21 0500  Weight: 66.4 kg 72.7 kg 63.1 kg   Examination: Physical Exam:  Constitutional: WN/WD Caucasian female currently in no acute distress appears calm but very withdrawn today Eyes: Lids and conjunctivae normal, sclerae anicteric  ENMT: External Ears, Nose appear normal. Grossly normal hearing.  Has a Cortrak in place.  Neck: Appears normal, supple, no cervical masses, normal ROM, no appreciable thyromegaly; no appreciable JVD Respiratory: Diminished to auscultation bilaterally with coarse breath sounds and no appreciable wheezing, rales, rhonchi.Normal respiratory effort and patient is not  tachypenic. No accessory muscle use.  Not wearing any supplemental extremity ischemia Cardiovascular: RRR, no murmurs / rubs / gallops. S1 and S2 auscultated.  Is some slight deformity edema Abdomen: Soft, non-tender, non-distended. Bowel sounds positive.  GU: Deferred. Musculoskeletal: No clubbing / cyanosis of digits/nails. No joint deformity upper and lower extremities. Skin: No rashes, lesions, ulcers on limited skin evaluation. No induration; Warm and dry.  Neurologic: CN 2-12 grossly intact with no focal deficits. Romberg sign and cerebellar reflexes not assessed.  Psychiatric: Normal judgment and insight. Alert and oriented x 3.  Depressed mood and flat affect  Data Reviewed: I have personally reviewed following labs and imaging studies  CBC: Recent Labs  Lab 06/20/21 1643 06/21/21 0242 06/21/21 0940 06/22/21 0656 06/23/21 0633 06/24/21 0431  WBC 6.5  --  6.6 4.4 4.0 5.3  NEUTROABS 5.1  --  4.8 2.6 2.4 3.9  HGB 6.7* 8.2* 8.0* 8.1* 8.4* 8.6*  HCT 21.0* 25.4* 24.1* 25.0* 26.1* 26.6*  MCV 93.8  --  92.7 93.3 92.9 90.8  PLT 144*  --  132* 151 180 371    Basic Metabolic Panel: Recent Labs  Lab 06/20/21 1643 06/21/21 0242 06/21/21 0940 06/21/21 1705 06/22/21 0656 06/23/21 0633 06/24/21 0431  NA 135  --  138  --  137 138 138  K 4.1  --  4.1  --  3.9 3.8 4.2  CL 108  --  111  --  109 110 112*  CO2 22  --  23  --  22 23 23   GLUCOSE 110*  --  101*  --  86 118* 108*  BUN 35*  --  29*  --  22 18 17   CREATININE 1.19*  --  1.06*  --  0.96 0.90 0.96  CALCIUM 8.8*  --  8.6*  --  8.9 8.8* 8.8*  MG 1.9   < > 1.8 1.8 1.7 2.2 2.3  PHOS 2.7   < > 2.6 2.7 2.8 2.9 3.4   < > = values in this interval not displayed.   GFR: Estimated Creatinine Clearance: 48.9 mL/min (by C-G formula based on SCr of 0.96 mg/dL). Liver Function Tests: Recent Labs  Lab 06/20/21 1643 06/21/21 0940 06/22/21 0656 06/23/21 0633 06/24/21 0431  AST 16 19 29 28 24   ALT 15 18 24 27 28   ALKPHOS 50 53  56 58 59  BILITOT 0.5 0.5 0.5 0.4 0.4  PROT 4.1* 4.1* 4.2* 4.5* 4.8*  ALBUMIN 1.7* 1.7* 1.7* 1.8* 2.0*    No results for input(s): LIPASE, AMYLASE  in the last 168 hours. Recent Labs  Lab 06/18/21 0835  AMMONIA 13    Coagulation Profile: No results for input(s): INR, PROTIME in the last 168 hours. Cardiac Enzymes: No results for input(s): CKTOTAL, CKMB, CKMBINDEX, TROPONINI in the last 168 hours. BNP (last 3 results) No results for input(s): PROBNP in the last 8760 hours. HbA1C: No results for input(s): HGBA1C in the last 72 hours. CBG: Recent Labs  Lab 06/23/21 2022 06/24/21 0011 06/24/21 0502 06/24/21 0802 06/24/21 1207  GLUCAP 113* 118* 98 103* 116*    Lipid Profile: No results for input(s): CHOL, HDL, LDLCALC, TRIG, CHOLHDL, LDLDIRECT in the last 72 hours. Thyroid Function Tests: No results for input(s): TSH, T4TOTAL, FREET4, T3FREE, THYROIDAB in the last 72 hours. Anemia Panel: No results for input(s): VITAMINB12, FOLATE, FERRITIN, TIBC, IRON, RETICCTPCT in the last 72 hours.  Sepsis Labs: No results for input(s): PROCALCITON, LATICACIDVEN in the last 168 hours.  Recent Results (from the past 240 hour(s))  CULTURE, BLOOD (ROUTINE X 2) w Reflex to ID Panel     Status: None (Preliminary result)   Collection Time: 06/20/21  4:42 PM   Specimen: BLOOD RIGHT ARM  Result Value Ref Range Status   Specimen Description BLOOD RIGHT ARM  Final   Special Requests   Final    BOTTLES DRAWN AEROBIC AND ANAEROBIC Blood Culture adequate volume   Culture   Final    NO GROWTH 4 DAYS Performed at [facility], [address] [place], [place] [address]    Report Status PENDING  Incomplete  CULTURE, BLOOD (ROUTINE X 2) w Reflex to ID Panel     Status: None (Preliminary result)   Collection Time: 06/20/21  4:53 PM   Specimen: BLOOD RIGHT HAND  Result Value Ref Range Status   Specimen Description BLOOD RIGHT HAND  Final   Special Requests   Final    BOTTLES DRAWN  AEROBIC AND ANAEROBIC Blood Culture adequate volume   Culture   Final    NO GROWTH 4 DAYS Performed at [facility], [address] [place], [place] [address]    Report Status PENDING  Incomplete  Urine Culture     Status: Abnormal   Collection Time: 06/21/21  7:30 AM   Specimen: Urine, Random  Result Value Ref Range Status   Specimen Description   Final    URINE, RANDOM Performed at [facility], [address] [place], [place] [address]    Special Requests   Final    NONE Performed at [facility], [address] [place],  [address]    Culture 70,000 COLONIES/mL PSEUDOMONAS AERUGINOSA (A)  Final   Report Status 06/23/2021 FINAL  Final   Organism ID, Bacteria PSEUDOMONAS AERUGINOSA (A)  Final      Susceptibility   Pseudomonas aeruginosa - MIC*    CEFTAZIDIME 2 SENSITIVE Sensitive     CIPROFLOXACIN <=0.25 SENSITIVE Sensitive     GENTAMICIN <=1 SENSITIVE Sensitive     IMIPENEM 2 SENSITIVE Sensitive     PIP/TAZO <=4 SENSITIVE Sensitive     CEFEPIME 2 SENSITIVE Sensitive     * 70,000 COLONIES/mL PSEUDOMONAS AERUGINOSA  C Difficile Quick Screen w PCR reflex     Status: Abnormal   Collection Time: 06/21/21  1:48 PM   Specimen: STOOL  Result Value Ref Range Status   C Diff antigen POSITIVE (A) NEGATIVE Final   C Diff toxin POSITIVE (A) NEGATIVE Final   C Diff interpretation Toxin producing C.  difficile detected.  Final    Comment: CRITICAL RESULT CALLED TO, READ BACK BY AND VERIFIED WITH: [name] HUSKNIS 06/21/21 1455 Performed at [facility], [address] [place], [place] [address]      RN Pressure Injury Documentation:     Estimated body mass index is 22.45 kg/m as calculated from the following:   Height as of this encounter: 5\' 6"  (1.676 m).   Weight as of this encounter: 63.1 kg.  Malnutrition Type: Nutrition Problem: Severe Malnutrition Etiology: chronic illness (major depressive disorder) Malnutrition  Characteristics: Signs/Symptoms: percent weight loss, severe fat depletion, severe muscle depletion Percent weight loss: 6.5 % (x2 months) Nutrition Interventions: Interventions: Tube feeding Radiology Studies: DG Chest Port 1 View  Result Date: 06/23/2021 CLINICAL DATA:  Shortness of breath. EXAM: PORTABLE CHEST 1 VIEW COMPARISON:  Portable chest 06/20/2021. FINDINGS: 4:15 a.m., 06/23/2021. Cardiomegaly. The central vessels are normal in caliber. Stable mediastinum with aortic tortuosity and ectasia. The lungs are clear of focal infiltrates, with linear scarring or atelectasis left mid field again noted. There is an irregular left upper lobe opacity measuring 1.7 x 0.8 cm. In retrospect this may have been present on earlier studies. Findings concerning for pulmonary nodule. Chest CT recommended preferably with contrast. No pleural effusion is seen. Osteopenia thoracic spondylosis. Feeding tube enters the stomach. IMPRESSION: 1.7 x 0.8 cm left upper lobe opacity concerning for nodule. Chest CT recommended, preferably with contrast. No appreciable focal pneumonia. Cardiomegaly. Electronically Signed   By: Telford Nab M.D.   On: 06/23/2021 06:06    Scheduled Meds:  sodium chloride   Intravenous Once   amoxicillin-clavulanate  1 tablet Oral Q12H   apixaban  5 mg Oral BID   Chlorhexidine Gluconate Cloth  6 each Topical Daily   cyanocobalamin  1,000 mcg Intramuscular Daily   free water  100 mL Per Tube Q4H   haloperidol lactate  0.5 mg Intravenous QHS   mirtazapine  7.5 mg Per Tube QHS   multivitamin with minerals  1 tablet Per Tube Daily   sodium bicarbonate  650 mg Per Tube BID   tamsulosin  0.4 mg Oral QPC supper   vancomycin  125 mg Oral QID   Vitamin D (Ergocalciferol)  50,000 Units Oral Q7 days   Continuous Infusions:  feeding supplement (OSMOLITE 1.2 CAL) 1,000 mL (06/24/21 1000)   thiamine 500mg  in normal saline (38ml) IVPB 500 mg (06/24/21 1038)    LOS: 5 days   Kerney Elbe, DO [facility] PAGER is on AMION  If 7PM-7AM, please contact night-coverage [email or web address]

## 2021-06-24 NOTE — Plan of Care (Signed)
°  Problem: Safety: Goal: Non-violent Restraint(s) Outcome: Progressing   Problem: Nutrition Goal: Patient maintains adequate hydration Outcome: Progressing Goal: Patient maintains weight Outcome: Progressing Goal: Patient/Family demonstrates understanding of diet Outcome: Progressing Goal: Patient/Family independently completes tube feeding Outcome: Progressing Goal: Patient will have no more than 5 lb weight change during LOS Outcome: Progressing Goal: Patient will utilize adaptive techniques to administer nutrition Outcome: Progressing Goal: Patient will verbalize dietary restrictions Outcome: Progressing   Problem: Education: Goal: Utilization of techniques to improve thought processes will improve Outcome: Progressing Goal: Knowledge of the prescribed therapeutic regimen will improve Outcome: Progressing   Problem: Activity: Goal: Interest or engagement in leisure activities will improve Outcome: Progressing Goal: Imbalance in normal sleep/wake cycle will improve Outcome: Progressing   Problem: Coping: Goal: Coping ability will improve Outcome: Progressing Goal: Will verbalize feelings Outcome: Progressing   Problem: Health Behavior/Discharge Planning: Goal: Ability to make decisions will improve Outcome: Progressing Goal: Compliance with therapeutic regimen will improve Outcome: Progressing   Problem: Role Relationship: Goal: Will demonstrate positive changes in social behaviors and relationships Outcome: Progressing   Problem: Safety: Goal: Ability to disclose and discuss suicidal ideas will improve Outcome: Progressing Goal: Ability to identify and utilize support systems that promote safety will improve Outcome: Progressing   Problem: Self-Concept: Goal: Will verbalize positive feelings about self Outcome: Progressing Goal: Level of anxiety will decrease Outcome: Progressing

## 2021-06-24 NOTE — Progress Notes (Signed)
Pt yellow MEWs due to elevated HR, pt ST 110''s-120's, pt asyptomatic, resting in bed. VSS. MD aware received verbal orders for ekg and 1L bolus. Will continue to monitor.

## 2021-06-25 ENCOUNTER — Other Ambulatory Visit: Payer: Self-pay | Admitting: Psychiatry

## 2021-06-25 ENCOUNTER — Inpatient Hospital Stay: Payer: Medicare Other | Admitting: Registered Nurse

## 2021-06-25 ENCOUNTER — Inpatient Hospital Stay (HOSPITAL_COMMUNITY)
Admission: AD | Admit: 2021-06-25 | Discharge: 2021-06-25 | Disposition: A | Discharge status: Home / Self Care | Payer: Medicare Other | Source: Other Acute Inpatient Hospital | Attending: Internal Medicine | Admitting: Internal Medicine

## 2021-06-25 DIAGNOSIS — F333 Major depressive disorder, recurrent, severe with psychotic symptoms: Secondary | ICD-10-CM

## 2021-06-25 LAB — COMPREHENSIVE METABOLIC PANEL
ALT: 21 U/L (ref 0–44)
AST: 14 U/L — ABNORMAL LOW (ref 15–41)
Albumin: 1.8 g/dL — ABNORMAL LOW (ref 3.5–5.0)
Alkaline Phosphatase: 50 U/L (ref 38–126)
Anion gap: 7 (ref 5–15)
BUN: 15 mg/dL (ref 8–23)
CO2: 21 mmol/L — ABNORMAL LOW (ref 22–32)
Calcium: 9.1 mg/dL (ref 8.9–10.3)
Chloride: 111 mmol/L (ref 98–111)
Creatinine, Ser: 0.87 mg/dL (ref 0.44–1.00)
GFR, Estimated: >60 mL/min (ref >60)
Glucose, Bld: 88 mg/dL (ref 70–99)
Potassium: 4.2 mmol/L (ref 3.5–5.1)
Sodium: 139 mmol/L (ref 135–145)
Total Bilirubin: 0.4 mg/dL (ref 0.3–1.2)
Total Protein: 4.4 g/dL — ABNORMAL LOW (ref 6.5–8.1)

## 2021-06-25 LAB — CBC WITH DIFFERENTIAL/PLATELET
Abs Immature Granulocytes: 0.08 10*3/uL — ABNORMAL HIGH (ref 0.00–0.07)
Basophils Absolute: 0 10*3/uL (ref 0.0–0.1)
Basophils Relative: 0 %
Eosinophils Absolute: 0.1 10*3/uL (ref 0.0–0.5)
Eosinophils Relative: 2 %
HCT: 25.6 % — ABNORMAL LOW (ref 36.0–46.0)
Hemoglobin: 8.1 g/dL — ABNORMAL LOW (ref 12.0–15.0)
Immature Granulocytes: 2 %
Lymphocytes Relative: 25 %
Lymphs Abs: 1.3 10*3/uL (ref 0.7–4.0)
MCH: 30 pg (ref 26.0–34.0)
MCHC: 31.6 g/dL (ref 30.0–36.0)
MCV: 94.8 fL (ref 80.0–100.0)
Monocytes Absolute: 0.3 10*3/uL (ref 0.1–1.0)
Monocytes Relative: 6 %
Neutro Abs: 3.4 10*3/uL (ref 1.7–7.7)
Neutrophils Relative %: 65 %
Platelets: 224 10*3/uL (ref 150–400)
RBC: 2.7 MIL/uL — ABNORMAL LOW (ref 3.87–5.11)
RDW: 15.1 % (ref 11.5–15.5)
WBC: 5.2 10*3/uL (ref 4.0–10.5)
nRBC: 0 % (ref 0.0–0.2)

## 2021-06-25 LAB — CULTURE, BLOOD (ROUTINE X 2)
Culture: NO GROWTH
Culture: NO GROWTH
Special Requests: ADEQUATE
Special Requests: ADEQUATE

## 2021-06-25 LAB — GLUCOSE, CAPILLARY
Glucose-Capillary: 80 mg/dL (ref 70–99)
Glucose-Capillary: 76 mg/dL (ref 70–99)
Glucose-Capillary: 86 mg/dL (ref 70–99)
Glucose-Capillary: 91 mg/dL (ref 70–99)
Glucose-Capillary: 100 mg/dL — ABNORMAL HIGH (ref 70–99)

## 2021-06-25 LAB — MAGNESIUM: Magnesium: 2.1 mg/dL (ref 1.7–2.4)

## 2021-06-25 LAB — LACTATE DEHYDROGENASE: LDH: 121 U/L (ref 98–192)

## 2021-06-25 MED ORDER — FENTANYL CITRATE (PF) 100 MCG/2ML IJ SOLN: 25.0000 ug | INTRAMUSCULAR | Status: DC | PRN

## 2021-06-25 MED ORDER — METHOHEXITAL SODIUM 100 MG/10ML IV SOSY
PREFILLED_SYRINGE | INTRAVENOUS | Status: DC | PRN
  Administered 2021-06-25: 70 mg via INTRAVENOUS

## 2021-06-25 MED ORDER — VANCOMYCIN 50 MG/ML ORAL SOLUTION
125.0000 mg | Freq: Four times a day (QID) | ORAL | Status: DC
  Administered 2021-06-26 – 2021-07-01 (×18): 125 mg
  Filled 2021-06-25 (×26): qty 2.5

## 2021-06-25 MED ORDER — ONDANSETRON HCL 4 MG/2ML IJ SOLN: 4.0000 mg | Freq: Once | INTRAMUSCULAR | Status: DC | PRN

## 2021-06-25 MED ORDER — OXYCODONE HCL 5 MG PO TABS: 5.0000 mg | ORAL_TABLET | Freq: Once | ORAL | Status: DC | PRN

## 2021-06-25 MED ORDER — SUCCINYLCHOLINE CHLORIDE 200 MG/10ML IV SOSY
PREFILLED_SYRINGE | INTRAVENOUS | Status: DC | PRN
  Administered 2021-06-25: 90 mg via INTRAVENOUS

## 2021-06-25 MED ORDER — GLYCOPYRROLATE 0.2 MG/ML IJ SOLN: 0.1000 mg | Freq: Once | INTRAMUSCULAR | Status: DC

## 2021-06-25 MED ORDER — DEXTROSE 10 % IV SOLN: INTRAVENOUS | Status: DC

## 2021-06-25 MED ORDER — OSMOLITE 1.2 CAL PO LIQD
1000.0000 mL | ORAL | Status: AC
  Administered 2021-06-25 – 2021-06-26 (×2): 1000 mL

## 2021-06-25 MED ORDER — MIDAZOLAM HCL 2 MG/2ML IJ SOLN
INTRAMUSCULAR | Status: DC | PRN
  Administered 2021-06-25: 2 mg via INTRAVENOUS

## 2021-06-25 MED ORDER — SODIUM CHLORIDE 0.9 % IV SOLN: 500.0000 mL | Freq: Once | INTRAVENOUS | Status: DC

## 2021-06-25 MED ORDER — SODIUM CHLORIDE 0.9 % IV SOLN: INTRAVENOUS | Status: DC | PRN

## 2021-06-25 MED ORDER — MIDAZOLAM HCL 2 MG/2ML IJ SOLN
INTRAMUSCULAR | Status: AC
  Filled 2021-06-25: qty 2

## 2021-06-25 MED ORDER — HALOPERIDOL LACTATE 5 MG/ML IJ SOLN: 5.0000 mg | Freq: Once | INTRAMUSCULAR | Status: DC

## 2021-06-25 MED ORDER — ACETAMINOPHEN 10 MG/ML IV SOLN: 1000.0000 mg | Freq: Once | INTRAVENOUS | Status: DC | PRN

## 2021-06-25 MED ORDER — HALOPERIDOL LACTATE 5 MG/ML IJ SOLN
INTRAMUSCULAR | Status: AC
  Administered 2021-06-25: 0.5 mg via INTRAVENOUS
  Administered 2021-06-25: 5 mg
  Filled 2021-06-25: qty 1

## 2021-06-25 MED ORDER — OXYCODONE HCL 5 MG/5ML PO SOLN: 5.0000 mg | Freq: Once | ORAL | Status: DC | PRN

## 2021-06-25 NOTE — H&P (Signed)
Amber [name] is an 76 y.o. female.   Chief Complaint: Patient severely depressed delusional negativistic.  Unable to make appropriate judgments about her condition. HPI: Severe depression  Past Medical History:  Diagnosis Date   Anxiety    Hypertension     History reviewed. No pertinent surgical history.  Family History  Problem Relation Age of Onset   High blood pressure Maternal Grandmother    Social History:  reports that she has quit smoking. Her smoking use included cigarettes. She has never used smokeless tobacco. She reports that she does not currently use alcohol. She reports that she does not currently use drugs.  Allergies:  Allergies  Allergen Reactions   Shellfish Allergy     "I pass out"    (Not in a hospital admission)   Results for orders placed or performed during the hospital encounter of 06/19/21 (from the past 48 hour(s))  Glucose, capillary     Status: Abnormal   Collection Time: 06/23/21  8:22 PM  Result Value Ref Range   Glucose-Capillary 113 (H) 70 - 99 mg/dL    Comment: Glucose reference range applies only to samples taken after fasting for at least 8 hours.  Glucose, capillary     Status: Abnormal   Collection Time: 06/24/21 12:11 AM  Result Value Ref Range   Glucose-Capillary 118 (H) 70 - 99 mg/dL    Comment: Glucose reference range applies only to samples taken after fasting for at least 8 hours.  CBC with Differential/Platelet     Status: Abnormal   Collection Time: 06/24/21  4:31 AM  Result Value Ref Range   WBC 5.3 4.0 - 10.5 K/uL   RBC 2.93 (L) 3.87 - 5.11 MIL/uL   Hemoglobin 8.6 (L) 12.0 - 15.0 g/dL   HCT 26.6 (L) 36.0 - 46.0 %   MCV 90.8 80.0 - 100.0 fL   MCH 29.4 26.0 - 34.0 pg   MCHC 32.3 30.0 - 36.0 g/dL   RDW 14.7 11.5 - 15.5 %   Platelets 222 150 - 400 K/uL   nRBC 0.0 0.0 - 0.2 %   Neutrophils Relative % 73 %   Neutro Abs 3.9 1.7 - 7.7 K/uL   Lymphocytes Relative 18 %   Lymphs Abs 1.0 0.7 - 4.0 K/uL   Monocytes Relative 6  %   Monocytes Absolute 0.3 0.1 - 1.0 K/uL   Eosinophils Relative 1 %   Eosinophils Absolute 0.1 0.0 - 0.5 K/uL   Basophils Relative 0 %   Basophils Absolute 0.0 0.0 - 0.1 K/uL   Immature Granulocytes 2 %   Abs Immature Granulocytes 0.08 (H) 0.00 - 0.07 K/uL    Comment: Performed at [facility], [address] [place], [place] [address]  Comprehensive metabolic panel     Status: Abnormal   Collection Time: 06/24/21  4:31 AM  Result Value Ref Range   Sodium 138 135 - 145 mmol/L   Potassium 4.2 3.5 - 5.1 mmol/L   Chloride 112 (H) 98 - 111 mmol/L   CO2 23 22 - 32 mmol/L   Glucose, Bld 108 (H) 70 - 99 mg/dL    Comment: Glucose reference range applies only to samples taken after fasting for at least 8 hours.   BUN 17 8 - 23 mg/dL   Creatinine, Ser 0.96 0.44 - 1.00 mg/dL   Calcium 8.8 (L) 8.9 - 10.3 mg/dL   Total Protein 4.8 (L) 6.5 - 8.1 g/dL   Albumin 2.0 (L) 3.5 - 5.0 g/dL  AST 24 15 - 41 U/L   ALT 28 0 - 44 U/L   Alkaline Phosphatase 59 38 - 126 U/L   Total Bilirubin 0.4 0.3 - 1.2 mg/dL   GFR, Estimated >60 >60 mL/min    Comment: (NOTE) Calculated using the CKD-EPI Creatinine Equation (2021)    Anion gap 3 (L) 5 - 15    Comment: Performed at [facility], [address] [place], [place] [address]  Magnesium     Status: None   Collection Time: 06/24/21  4:31 AM  Result Value Ref Range   Magnesium 2.3 1.7 - 2.4 mg/dL    Comment: Performed at [facility], [address] [place], [place] [address]  Phosphorus     Status: None   Collection Time: 06/24/21  4:31 AM  Result Value Ref Range   Phosphorus 3.4 2.5 - 4.6 mg/dL    Comment: Performed at [facility], [address] [place], [place] [address]  Glucose, capillary     Status: None   Collection Time: 06/24/21  5:02 AM  Result Value Ref Range   Glucose-Capillary 98 70 - 99 mg/dL    Comment: Glucose reference range applies only to samples taken after fasting for at least 8 hours.   Glucose, capillary     Status: Abnormal   Collection Time: 06/24/21  8:02 AM  Result Value Ref Range   Glucose-Capillary 103 (H) 70 - 99 mg/dL    Comment: Glucose reference range applies only to samples taken after fasting for at least 8 hours.  Glucose, capillary     Status: Abnormal   Collection Time: 06/24/21 12:07 PM  Result Value Ref Range   Glucose-Capillary 116 (H) 70 - 99 mg/dL    Comment: Glucose reference range applies only to samples taken after fasting for at least 8 hours.  Glucose, capillary     Status: Abnormal   Collection Time: 06/24/21  4:45 PM  Result Value Ref Range   Glucose-Capillary 101 (H) 70 - 99 mg/dL    Comment: Glucose reference range applies only to samples taken after fasting for at least 8 hours.  Glucose, capillary     Status: None   Collection Time: 06/24/21  7:56 PM  Result Value Ref Range   Glucose-Capillary 98 70 - 99 mg/dL    Comment: Glucose reference range applies only to samples taken after fasting for at least 8 hours.  Glucose, capillary     Status: None   Collection Time: 06/25/21  1:36 AM  Result Value Ref Range   Glucose-Capillary 80 70 - 99 mg/dL    Comment: Glucose reference range applies only to samples taken after fasting for at least 8 hours.  Glucose, capillary     Status: None   Collection Time: 06/25/21  5:09 AM  Result Value Ref Range   Glucose-Capillary 76 70 - 99 mg/dL    Comment: Glucose reference range applies only to samples taken after fasting for at least 8 hours.  Comprehensive metabolic panel     Status: Abnormal   Collection Time: 06/25/21  6:40 AM  Result Value Ref Range   Sodium 139 135 - 145 mmol/L   Potassium 4.2 3.5 - 5.1 mmol/L   Chloride 111 98 - 111 mmol/L   CO2 21 (L) 22 - 32 mmol/L   Glucose, Bld 88 70 - 99 mg/dL    Comment: Glucose reference range applies only to samples taken after fasting for at least 8 hours.   BUN  15 8 - 23 mg/dL   Creatinine, Ser 0.87 0.44 - 1.00 mg/dL   Calcium 9.1 8.9 - 10.3  mg/dL   Total Protein 4.4 (L) 6.5 - 8.1 g/dL   Albumin 1.8 (L) 3.5 - 5.0 g/dL   AST 14 (L) 15 - 41 U/L   ALT 21 0 - 44 U/L   Alkaline Phosphatase 50 38 - 126 U/L   Total Bilirubin 0.4 0.3 - 1.2 mg/dL   GFR, Estimated >60 >60 mL/min    Comment: (NOTE) Calculated using the CKD-EPI Creatinine Equation (2021)    Anion gap 7 5 - 15    Comment: Performed at [facility], [address] [place], [place] [address]  CBC with Differential/Platelet     Status: Abnormal   Collection Time: 06/25/21  6:40 AM  Result Value Ref Range   WBC 5.2 4.0 - 10.5 K/uL   RBC 2.70 (L) 3.87 - 5.11 MIL/uL   Hemoglobin 8.1 (L) 12.0 - 15.0 g/dL   HCT 25.6 (L) 36.0 - 46.0 %   MCV 94.8 80.0 - 100.0 fL   MCH 30.0 26.0 - 34.0 pg   MCHC 31.6 30.0 - 36.0 g/dL   RDW 15.1 11.5 - 15.5 %   Platelets 224 150 - 400 K/uL   nRBC 0.0 0.0 - 0.2 %   Neutrophils Relative % 65 %   Neutro Abs 3.4 1.7 - 7.7 K/uL   Lymphocytes Relative 25 %   Lymphs Abs 1.3 0.7 - 4.0 K/uL   Monocytes Relative 6 %   Monocytes Absolute 0.3 0.1 - 1.0 K/uL   Eosinophils Relative 2 %   Eosinophils Absolute 0.1 0.0 - 0.5 K/uL   Basophils Relative 0 %   Basophils Absolute 0.0 0.0 - 0.1 K/uL   Immature Granulocytes 2 %   Abs Immature Granulocytes 0.08 (H) 0.00 - 0.07 K/uL    Comment: Performed at [facility], [address] [place], [place] [address]  Lactate dehydrogenase     Status: None   Collection Time: 06/25/21  6:40 AM  Result Value Ref Range   LDH 121 98 - 192 U/L    Comment: Performed at [facility], [address] [place], [place] [address]  Magnesium     Status: None   Collection Time: 06/25/21  6:40 AM  Result Value Ref Range   Magnesium 2.1 1.7 - 2.4 mg/dL    Comment: Performed at [facility], [address] [place], [place] [address]  Glucose, capillary     Status: None   Collection Time: 06/25/21  7:57 AM  Result Value Ref Range   Glucose-Capillary 86 70 - 99 mg/dL    Comment:  Glucose reference range applies only to samples taken after fasting for at least 8 hours.  Glucose, capillary     Status: None   Collection Time: 06/25/21  4:23 PM  Result Value Ref Range   Glucose-Capillary 91 70 - 99 mg/dL    Comment: Glucose reference range applies only to samples taken after fasting for at least 8 hours.   CT CHEST W CONTRAST  Result Date: 06/24/2021 CLINICAL DATA:  Lung nodule, > 90mm Lung Nodule EXAM: CT CHEST WITH CONTRAST TECHNIQUE: Multidetector CT imaging of the chest was performed during intravenous contrast administration. RADIATION DOSE REDUCTION: This exam was performed according to the departmental dose-optimization program which includes automated exposure control, adjustment of the mA and/or kV according to patient size and/or use of iterative reconstruction technique. CONTRAST:  60mL  OMNIPAQUE IOHEXOL 300 MG/ML  SOLN COMPARISON:  Same day radiograph. FINDINGS: Cardiovascular: There is peripherally located pulmonary embolism located in both main pulmonary arteries. This is nonocclusive. No CT evidence of RIGHT heart strain. Heart is enlarged. Three-vessel coronary artery atherosclerotic calcifications. No pericardial effusion. LEFT vertebral artery arises directly from the aorta. Mediastinum/Nodes: LEFT thyroid nodule measuring approximately 20 mm. No suspicious axillary or mediastinal adenopathy. Lungs/Pleura: No pleural effusion or pneumothorax. Nodular opacity of the superior aspect of the LEFT lower lobe demonstrates several internal lucencies and spans 15 by 10 mm (series 2, image 50). Additional scattered ground-glass opacities throughout the LEFT lower lobe. Lingular atelectasis. Upper Abdomen: LEFT hepatic cyst. Enteric tube tip terminates in the duodenum. Diverticulosis. Musculoskeletal: Degenerative changes of the thoracic spine. IMPRESSION: 1. There is peripherally located pulmonary embolism located most centrally within the main branches of the pulmonary artery.  It is nonocclusive. No CT evidence of RIGHT heart strain. This is consistent with age-indeterminate pulmonary emboli. 2. Nodular opacity seen on chest radiograph corresponds to a ground-glass opacity with internal lucency in the superior aspect of the LEFT lower lobe. Differential considerations include infection, sequela of pulmonary infarction, or neoplasm. Recommend follow-up CT in 3 months. This recommendation follows the consensus statement: Guidelines for Management of Incidental Pulmonary Nodules Detected on CT Images: From the Fleischner Society 2017; Radiology 2017; 284:228-243. 3. There is a 2 cm LEFT thyroid nodule. Recommend nonemergent thyroid US (ref: J Am Coll Radiol. 2015 Feb;12(2): 143-50). Aortic Atherosclerosis (ICD10-I70.0). These results were called by telephone at the time of interpretation on 06/24/2021 at 6:06 pm to provider [facility] , who verbally acknowledged these results. Electronically Signed   By: Valentino Saxon M.D.   On: 06/24/2021 18:11   DG Chest Port 1 View  Result Date: 06/24/2021 CLINICAL DATA:  Pre ECT EXAM: PORTABLE CHEST 1 VIEW COMPARISON:  June 23, 2021 FINDINGS: The cardiomediastinal silhouette is unchanged in contour.Tortuous thoracic aorta. The enteric tube courses through the chest to the abdomen beyond the field-of-view. No pleural effusion. No pneumothorax. Similar appearance of a LEFT upper lung nodular opacity. Scattered linear opacities most consistent with atelectasis. Visualized abdomen is unremarkable. IMPRESSION: 1. Stable chest radiograph. Similar appearance of a possible nodular opacity versus summation artifact of overlapping vascular shadows of the LEFT upper lung. When clinically appropriate, chest CT with contrast is recommended versus follow-up PA and lateral chest radiograph in 3-4 weeks. Electronically Signed   By: Valentino Saxon M.D.   On: 06/24/2021 17:17    Review of Systems  Unable to perform ROS: Psychiatric disorder  Skin:  Negative.    Blood pressure 140/84, pulse (!) 105, temperature (!) 97.5 F (36.4 C), resp. rate (!) 22, SpO2 100 %. Physical Exam Vitals and nursing note reviewed.  Constitutional:      Appearance: She is well-developed.  HENT:     Head: Normocephalic and atraumatic.  Eyes:     Conjunctiva/sclera: Conjunctivae normal.     Pupils: Pupils are equal, round, and reactive to light.  Cardiovascular:     Heart sounds: Normal heart sounds.  Pulmonary:     Effort: Pulmonary effort is normal.  Abdominal:     Palpations: Abdomen is soft.     Comments: Feeding tube in place nasogastric  Musculoskeletal:        General: Normal range of motion.     Cervical back: Normal range of motion.  Skin:    General: Skin is warm and dry.  Neurological:     Mental  Status: She is alert.  Psychiatric:        Attention and Perception: She is inattentive.        Mood and Affect: Affect is blunt.        Speech: Speech is delayed.        Thought Content: Thought content is delusional.        Cognition and Memory: Cognition is impaired.     Assessment/Plan Patient receiving ECT beginning index course today.  Breathing seems much improved.  Bowel movements approximately 1 a day despite diagnosis of C. difficile  Alethia Berthold, MD 06/25/2021, 5:48 PM

## 2021-06-25 NOTE — Progress Notes (Signed)
MD notified of patients elevated heart rate in the 120's as well as an episode in the 170's non-sustained, no new orders.

## 2021-06-25 NOTE — Transfer of Care (Signed)
Immediate Anesthesia Transfer of Care Note  Patient: Amber [name]  Procedure(s) Performed: ECT TX  Patient Location: PACU  Anesthesia Type:General  Level of Consciousness: drowsy  Airway & Oxygen Therapy: Patient Spontanous Breathing  Post-op Assessment: Report given to RN and Post -op Vital signs reviewed and stable  Post vital signs: Reviewed and stable  Last Vitals:  Vitals Value Taken Time  BP    Temp    Pulse 107 06/25/21 1425  Resp 25 06/25/21 1425  SpO2      Last Pain:  Vitals:   06/25/21 1229  TempSrc: Tympanic         Complications: No notable events documented.

## 2021-06-25 NOTE — Procedures (Signed)
ECT SERVICES Physicians Interval Evaluation & Treatment Note  Patient Identification: Amber [name] MRN:  [id number] Date of Evaluation:  06/25/2021 TX #: 1  MADRS:   MMSE:   P.E. Findings:  Very debilitated.  Feeding tube in place nasogastric tube.  Vital signs stable however breathing normal normal oxygenation  Psychiatric Interval Note:  Severely depressed with psychotic features  Subjective:  Patient is a 76 y.o. female seen for evaluation for Electroconvulsive Therapy. Unable to show appropriate insight or make decisions about treatment  Treatment Summary:   []   Right Unilateral             [x]  Bilateral   % Energy : 1.0 ms 40%   Impedance: 2300 ohms  Seizure Energy Index: No reading  Postictal Suppression Index: No reading  Seizure Concordance Index: 49%  Medications  Pre Shock: Robinul 0.1 mg Brevital 70 mg succinylcholine 90 mg  Post Shock: Versed 2 mg  Seizure Duration: Poor tracing in part due to patient's movement and noncooperation but clearly had an extended seizure.  Length of EMG unreadable.  99 seconds possibly for the full EEG seizure   Comments: Continue index course next treatment Wednesday  Lungs:  [x]   Clear to auscultation               []  Other:   Heart:    [x]   Regular rhythm             []  irregular rhythm    [x]   Previous H&P reviewed, patient examined and there are NO CHANGES                 []   Previous H&P reviewed, patient examined and there are changes noted.   Alethia Berthold, MD 1/30/20235:59 PM

## 2021-06-25 NOTE — Progress Notes (Signed)
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 Weber Cooks of psychiatry.  She was admitted to [facility] from 06/08/21-06/19/21 after presenting with failure to thrive following a several week history of complaints of difficulty swallowing with decreased oral intake and subsequent development of confusion and hallucinations.  She had outpatient work-up include brain MRI as well as MRV which was unremarkable.  On admission she is found to have an AKI and several electrolyte abnormalities.  While at [facility] she refused to eat and refused further evaluation with esophagram to evaluate her sensation of dysphagia.  She is evaluated by psychiatry who determined her to have capacity for medical decision-making and so an NG tube was placed for force-feeding.  Palliative care consult was also done.  Due to her poor response to oral antidepressants ECG was recommended and thus arranged to transfer to [facility].  On 06/08/2020 she tested positive for COVID and she received 3 days of remdesivir.  Most of her nutritional deficiencies related to inadequate oral intake were corrected prior to transfer and on the day of discharge she developed acute urinary tension a Foley catheter was placed and it was traumatic insertion with gross development of gross hematuria.  Hemoglobin did drop to 7.0 and repeat is pending.   On the afternoon of 06/21/21 she spiked a temperature of 101.9 and became tachycardic and slightly hypotensive.  She is given IV fluid bolus and she is pancultured.  Further work-up revealed that she had a right lower lobe  pneumonia so she was started on IV Unasyn.  She had some loose stools and this was tested for C. difficile however we can consider C. difficile colonization given that she no longer spiking temperatures and afebrile and has no leukocytosis.  SLP evaluated and recommending a clear liquid diet with thins in addition to an NG tube.  Given that she has a recent pneumonia and anesthesia felt it would be safer to wait a couple more days and for ECT treatment until that time to help clear her pneumonia.  She has now undergone ECT treatment Monday, 06/25/2021.  We will change her antibiotics from IV Unasyn to p.o. Augmentin for her Pneunmonia  Given that she continues to have some mild loose stools I spoke with ID who feels that since the patient was positive toxin producing C. difficile empirically treat with vancomycin for 10 days.  Foley catheter has been removed.  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[place]) Active Problems:   Moderate protein-calorie malnutrition ([place])   Primary hyperparathyroidism ([place])   Dysphagia   COVID-19 virus infection 06/08/2021   Nasogastric tube present   Chronic deep vein thrombosis (DVT) (HCC)   Chronic anticoagulation   ABLA (acute blood loss anemia)   Acute urinary retention   Foley catheter in place   Stage 3a chronic kidney disease (HCC)   At high risk for inadequate nutritional intake   Gross hematuria   Fever   Aspiration pneumonia of right lung due to gastric secretions (HCC)  Psychotic depression placing patient at high nutritional risk  Need for electroconvulsive  therapy -Psych consult to evaluate for ECT Per arrangements made prior to transfer -Continue mirtazapine 7.5 mg per tube via NGT -Further care per psychiatry and they did an ECT treatment today   Inadequate oral nutritional intake  Nutritional deficiencies requiring NGT for force-feeding Moderate protein-calorie malnutrition ([place]) Dysphagia of unknown  etiology -Nutritionist consult for tube feeds - Continue nutritional supplementation via NG tube - Patient has refused further evaluation of dysphagia to evaluate for organic etiology -She does not have the capacity so we will need to get SLP involved again -Continue with Osmolite per nutritionist recommendations and I have started the patient on clears and will continue and see how she responds to ECT to remove NG tube -Have also consulted speech who recommended clear liquid diet and advancing diet once the CT is completed  Sepsis 2/2 Aspiration PNA  -Became febrile and tachycardic this afternoon as well as hypotensive slightly; likely could have aspirated with her NG tube in place -Initiate IV Unasyn and will continue and she received 3 days of antibiotics and will change to p.o. Augmentin for final 4 more days; her fevers have since resolved and WBC is improved -Given IV fluid boluses 2 L and we will panculture and obtain blood cultures x2, urinalysis and urine culture as well as chest x-ray -Chest x-ray done and showed "There is new patchy infiltrate in right lower lung fields suggesting atelectasis/pneumonia. There are transverse linear densities in the left parahilar region suggesting scarring or subsegmental atelectasis or lung nodules." -We will order flutter valve and incentive spirometer -C. difficile was checked and she is positive for toxin producing C. difficile however given that she does not have a white count we will not currently treat as we believe this may be a colonization -Remains significantly withdrawn but not complaining of any shortness of breath -Repeat chest x-ray today shows "1.7 x 0.8 cm left upper lobe opacity concerning for nodule. Chest CT recommended, preferably with contrast. No appreciable focal pneumonia. Cardiomegaly." -CT of the Chest with Contrast done and it showed "Nodular opacity seen on chest radiograph corresponds to a ground-glass opacity with internal  lucency in the superior aspect of the LEFT lower lobe. Differential considerations include infection, sequela of pulmonary infarction, or neoplasm. Recommend follow-up CT in 3 months" -The CT of the chest also showed "1. There is peripherally located pulmonary embolism located most centrally within the main branches of the pulmonary artery. It is nonocclusive. No CT evidence of RIGHT heart strain. This is consistent with age-indeterminate pulmonary emboli. -Patient is already on anticoagulation  Suspected Lung Nodule -As Above noted to be 1.7 x 0.8 cm Left Upper Lobe Opacity -CT of the Chest with Contrast done and it showed "Nodular opacity seen on chest radiograph corresponds to a ground-glass opacity with internal lucency in the superior aspect of the LEFT lower lobe. Differential considerations include infection, sequela of pulmonary infarction, or neoplasm. Recommend follow-up CT in 3 months"  C. Difficile Diarrhea -Clinically she does not really have signs and symptoms for C. difficile given that she is afebrile now and has no leukocytosis -The tube feedings are likely causing the patient's loose stools however we cannot confirm the patient has actual C. Difficile as we do not have PCR testing here  -ID recommends just treating empirically with vancomycin 125 mg p.o. 4 times daily for 10 days  Bacteriuria and pyuria without evidence of infection -When she spiked a temperature few days ago we ordered a urinalysis which showed a hazy appearance with  moderate hemoglobin, moderate leukocytes, negative nitrites, rare bacteria, 21-50 RBCs, no squamous epithelial cells, greater than 50 WBCs -Urine culture showed 70,000 colony-forming units of Pseudomonas which which was pansensitive -Will not treat her Pseudomonas UTI as she has no symptoms and will remove her Foley catheter -Foley catheter has been removed and will continue to monitor her I's and O's  Thrombocytopenia -Patient's platelet count  went from 181 ->144 -> 132 -> 151 -> 180 -> 222 -> 224 -Has been bleeding in the urine after Foley catheter placement but discharge resolved -Continue monitor for signs and symptoms bleeding; currently her Foley has some darker red blood in it but this is improving -Repeat CBC in the a.m.   Acute urinary retention Foley catheter in place - Foley catheter was inserted but now removed to do a trial of void and move Foley catheter; Foley catheter has been removed and she has been urinating adequately --Continue Tamsulosin   ABLA (acute blood loss anemia) 1/24 Gross hematuria secondary to traumatic Foley -Hemoglobin continued to drop and trended down from 9.2/28.3 is now 6.7/21.0 so she was typed and screened and transfused 2 units of PRBCs and went to 8.2/25.4 -> 8.0/24.1 -> 8.1/25.0 -> 8.4/26.1 -> 8.6/26.6 -> 8.1/25.6 with no other signs of bleeding -She is typed and screened and transfused 1 unit of PRBCs -Continued to trend H&H and transfuse if necessary(daughter consented via telephone) -One time NS bolus ordered for another few boluses as above -CBI may be necessary -Resumed anticoagulation with Eliquis   Chronic anticoagulation 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 - Initially was holding Eliquis and Lovenox at this time given her hemoglobin drop -SCDs for DVT prophylaxis -Resumed systemic anticoagulation with Eliquis and will continue now   COVID-19 virus infection 06/08/2021 - Not symptomatic -Completed remdesivir for 3-day -We will treat her aspiration pneumonia as above   Stage 3a chronic kidney disease ([place]) Primary hyperparathyroidism/vitamin D deficiency Metabolic acidosis - Continue supplementation via NG tube -Patient's BUNs/creatinine went from 34/1.14 -> 29/1.19 -> 36/1.23 -> 35/1.19 -> 29/1.06 -> 22/0/96 -> 18/0.90 -> 17/0.96 and is now 15/0.87 -She has a mild metabolic acidosis with a CO2 of 21, anion gap of 7, chloride level of 111 -Avoid further nephrotoxic  medications, contrast dyes, hypotension and dehydration and renally dose medications -Repeat CMP in the AM   Severe malnutrition related to chronic illness -Evidence by percent weight loss, severe fat depletion and severe muscle depletion -Nutritionist consulted for further evaluation recommendations and recommended initiating tube feedings via NG tube Osmolite 1.2 at 65 MLS per hour with 100 mL of free water flushes every 4 hours and her liquid multivitamin was discontinued and they recommended crushing multivitamin with minerals per tube daily  DVT prophylaxis: SCDs Code Status: DO NOT RESUSCITATE Family Communication: No family currently at bedside Disposition Plan: Pending further clinical improvement and clearance by psychiatry  Status is: Inpatient  Remains inpatient appropriate because: She is spiking temperatures and will be getting ECT on Friday   Consultants:  Psychiatry  Discussed with ID Dr. Candiss Norse  Procedures: ECT  Antimicrobials:  Anti-infectives (From admission, onward)    Start     Dose/Rate Route Frequency Ordered Stop   06/23/21 2200  amoxicillin-clavulanate (AUGMENTIN) 875-125 MG per tablet 1 tablet        1 tablet Oral Every 12 hours 06/23/21 1701     06/23/21 1600  vancomycin (VANCOCIN) capsule 125 mg        125 mg Oral 4 times  daily 06/23/21 1457 07/03/21 1759   06/20/21 1900  Ampicillin-Sulbactam (UNASYN) 3 g in sodium chloride 0.9 % 100 mL IVPB  Status:  Discontinued        3 g 200 mL/hr over 30 Minutes Intravenous Every 8 hours 06/20/21 1812 06/23/21 1701        Subjective: Seen and examined at bedside and was very withdrawn and did not want to do anything.  Laying on her left side and did not really want to interact.  Denying chest pain, shortness with, nausea or vomiting and denies any abdominal pain.  No other concerns or complaints at this time.  Objective: Vitals:   06/25/21 0511 06/25/21 0827 06/25/21 1145 06/25/21 1541  BP: 108/73 112/64  115/66 117/90  Pulse: (!) 101 88 92 (!) 110  Resp: 16 16 14 19   Temp: 98.7 F (37.1 C) 98.1 F (36.7 C) 98 F (36.7 C)   TempSrc: Oral Oral Oral   SpO2: 98% 100% 100% 99%  Weight:      Height:        Intake/Output Summary (Last 24 hours) at 06/25/2021 1554 Last data filed at 06/25/2021 1417 Gross per 24 hour  Intake 1100 ml  Output 375 ml  Net 725 ml    Filed Weights   06/21/21 0112 06/24/21 0500 06/25/21 0500  Weight: 72.7 kg 63.1 kg 69.1 kg   Examination: Physical Exam:  Constitutional: WN/WD Caucasian female currently no acute distress appears calm and very withdrawn Eyes: Lids and conjunctivae normal, sclerae anicteric  ENMT: External Ears, Nose appear normal. Grossly normal hearing. Has a Cortrak in place Neck: Appears normal, supple, no cervical masses, normal ROM, no appreciable thyromegaly; no appreciable JVD Respiratory: Diminished to auscultation bilaterally, no wheezing, rales, rhonchi or crackles. Normal respiratory effort and patient is not tachypenic. No accessory muscle use.  Unlabored breathing Cardiovascular: RRR, no murmurs / rubs / gallops. S1 and S2 auscultated.  Trace extremity edema Abdomen: Soft, non-tender, non-distended. Bowel sounds positive.  GU: Deferred. Musculoskeletal: No clubbing / cyanosis of digits/nails. No joint deformity upper and lower extremities.  Skin: No rashes, lesions, ulcers on limited skin evaluation. No induration; Warm and dry.  Neurologic: CN 2-12 grossly intact with no focal deficits. Romberg sign and cerebellar reflexes not assessed.  Psychiatric: Normal judgment and insight. Alert and oriented x 3.  Depressed mood and very flat affect and was very withdrawn  Data Reviewed: I have personally reviewed following labs and imaging studies  CBC: Recent Labs  Lab 06/21/21 0940 06/22/21 0656 06/23/21 0633 06/24/21 0431 06/25/21 0640  WBC 6.6 4.4 4.0 5.3 5.2  NEUTROABS 4.8 2.6 2.4 3.9 3.4  HGB 8.0* 8.1* 8.4* 8.6* 8.1*  HCT  24.1* 25.0* 26.1* 26.6* 25.6*  MCV 92.7 93.3 92.9 90.8 94.8  PLT 132* 151 180 222 782    Basic Metabolic Panel: Recent Labs  Lab 06/21/21 0940 06/21/21 1705 06/22/21 0656 06/23/21 0633 06/24/21 0431 06/25/21 0640  NA 138  --  137 138 138 139  K 4.1  --  3.9 3.8 4.2 4.2  CL 111  --  109 110 112* 111  CO2 23  --  22 23 23  21*  GLUCOSE 101*  --  86 118* 108* 88  BUN 29*  --  22 18 17 15   CREATININE 1.06*  --  0.96 0.90 0.96 0.87  CALCIUM 8.6*  --  8.9 8.8* 8.8* 9.1  MG 1.8 1.8 1.7 2.2 2.3 2.1  PHOS 2.6 2.7 2.8 2.9 3.4  --  GFR: Estimated Creatinine Clearance: 53.9 mL/min (by C-G formula based on SCr of 0.87 mg/dL). Liver Function Tests: Recent Labs  Lab 06/21/21 0940 06/22/21 0656 06/23/21 0633 06/24/21 0431 06/25/21 0640  AST 19 29 28 24  14*  ALT 18 24 27 28 21   ALKPHOS 53 56 58 59 50  BILITOT 0.5 0.5 0.4 0.4 0.4  PROT 4.1* 4.2* 4.5* 4.8* 4.4*  ALBUMIN 1.7* 1.7* 1.8* 2.0* 1.8*    No results for input(s): LIPASE, AMYLASE in the last 168 hours. No results for input(s): AMMONIA in the last 168 hours.  Coagulation Profile: No results for input(s): INR, PROTIME in the last 168 hours. Cardiac Enzymes: No results for input(s): CKTOTAL, CKMB, CKMBINDEX, TROPONINI in the last 168 hours. BNP (last 3 results) No results for input(s): PROBNP in the last 8760 hours. HbA1C: No results for input(s): HGBA1C in the last 72 hours. CBG: Recent Labs  Lab 06/24/21 1645 06/24/21 1956 06/25/21 0136 06/25/21 0509 06/25/21 0757  GLUCAP 101* 98 80 76 86    Lipid Profile: No results for input(s): CHOL, HDL, LDLCALC, TRIG, CHOLHDL, LDLDIRECT in the last 72 hours. Thyroid Function Tests: No results for input(s): TSH, T4TOTAL, FREET4, T3FREE, THYROIDAB in the last 72 hours. Anemia Panel: No results for input(s): VITAMINB12, FOLATE, FERRITIN, TIBC, IRON, RETICCTPCT in the last 72 hours.  Sepsis Labs: No results for input(s): PROCALCITON, LATICACIDVEN in the last 168  hours.  Recent Results (from the past 240 hour(s))  CULTURE, BLOOD (ROUTINE X 2) w Reflex to ID Panel     Status: None   Collection Time: 06/20/21  4:42 PM   Specimen: BLOOD RIGHT ARM  Result Value Ref Range Status   Specimen Description BLOOD RIGHT ARM  Final   Special Requests   Final    BOTTLES DRAWN AEROBIC AND ANAEROBIC Blood Culture adequate volume   Culture   Final    NO GROWTH 5 DAYS Performed at [facility], [address] [place], [place] [address]    Report Status 06/25/2021 FINAL  Final  CULTURE, BLOOD (ROUTINE X 2) w Reflex to ID Panel     Status: None   Collection Time: 06/20/21  4:53 PM   Specimen: BLOOD RIGHT HAND  Result Value Ref Range Status   Specimen Description BLOOD RIGHT HAND  Final   Special Requests   Final    BOTTLES DRAWN AEROBIC AND ANAEROBIC Blood Culture adequate volume   Culture   Final    NO GROWTH 5 DAYS Performed at [facility], [address], [place], [place] [address]    Report Status 06/25/2021 FINAL  Final  Urine Culture     Status: Abnormal   Collection Time: 06/21/21  7:30 AM   Specimen: Urine, Random  Result Value Ref Range Status   Specimen Description   Final    URINE, RANDOM Performed at [facility], [address], [place], [place] [address]    Special Requests   Final    NONE Performed at [facility], [place], [place] [address]    Culture 70,000 COLONIES/mL PSEUDOMONAS AERUGINOSA (A)  Final   Report Status 06/23/2021 FINAL  Final   Organism ID, Bacteria PSEUDOMONAS AERUGINOSA (A)  Final      Susceptibility   Pseudomonas aeruginosa - MIC*    CEFTAZIDIME 2 SENSITIVE Sensitive     CIPROFLOXACIN <=0.25 SENSITIVE Sensitive     GENTAMICIN <=1 SENSITIVE Sensitive     IMIPENEM 2 SENSITIVE Sensitive  PIP/TAZO <=4 SENSITIVE Sensitive     CEFEPIME 2 SENSITIVE Sensitive     * 70,000 COLONIES/mL PSEUDOMONAS AERUGINOSA  C Difficile Quick Screen w PCR reflex     Status:  Abnormal   Collection Time: 06/21/21  1:48 PM   Specimen: STOOL  Result Value Ref Range Status   C Diff antigen POSITIVE (A) NEGATIVE Final   C Diff toxin POSITIVE (A) NEGATIVE Final   C Diff interpretation Toxin producing C. difficile detected.  Final    Comment: CRITICAL RESULT CALLED TO, READ BACK BY AND VERIFIED WITH: [name] HUSKNIS 06/21/21 1455 Performed at [facility], [address] [place],  [address]      RN Pressure Injury Documentation:     Estimated body mass index is 24.59 kg/m as calculated from the following:   Height as of this encounter: 5\' 6"  (1.676 m).   Weight as of this encounter: 69.1 kg.  Malnutrition Type: Nutrition Problem: Severe Malnutrition Etiology: chronic illness (major depressive disorder) Malnutrition Characteristics: Signs/Symptoms: percent weight loss, severe fat depletion, severe muscle depletion Percent weight loss: 6.5 % (x2 months) Nutrition Interventions: Interventions: Tube feeding Radiology Studies: CT CHEST W CONTRAST  Result Date: 06/24/2021 CLINICAL DATA:  Lung nodule, > 42mm Lung Nodule EXAM: CT CHEST WITH CONTRAST TECHNIQUE: Multidetector CT imaging of the chest was performed during intravenous contrast administration. RADIATION DOSE REDUCTION: This exam was performed according to the departmental dose-optimization program which includes automated exposure control, adjustment of the mA and/or kV according to patient size and/or use of iterative reconstruction technique. CONTRAST:  67mL OMNIPAQUE IOHEXOL 300 MG/ML  SOLN COMPARISON:  Same day radiograph. FINDINGS: Cardiovascular: There is peripherally located pulmonary embolism located in both main pulmonary arteries. This is nonocclusive. No CT evidence of RIGHT heart strain. Heart is enlarged. Three-vessel coronary artery atherosclerotic calcifications. No pericardial effusion. LEFT vertebral artery arises directly from the aorta. Mediastinum/Nodes: LEFT thyroid nodule  measuring approximately 20 mm. No suspicious axillary or mediastinal adenopathy. Lungs/Pleura: No pleural effusion or pneumothorax. Nodular opacity of the superior aspect of the LEFT lower lobe demonstrates several internal lucencies and spans 15 by 10 mm (series 2, image 50). Additional scattered ground-glass opacities throughout the LEFT lower lobe. Lingular atelectasis. Upper Abdomen: LEFT hepatic cyst. Enteric tube tip terminates in the duodenum. Diverticulosis. Musculoskeletal: Degenerative changes of the thoracic spine. IMPRESSION: 1. There is peripherally located pulmonary embolism located most centrally within the main branches of the pulmonary artery. It is nonocclusive. No CT evidence of RIGHT heart strain. This is consistent with age-indeterminate pulmonary emboli. 2. Nodular opacity seen on chest radiograph corresponds to a ground-glass opacity with internal lucency in the superior aspect of the LEFT lower lobe. Differential considerations include infection, sequela of pulmonary infarction, or neoplasm. Recommend follow-up CT in 3 months. This recommendation follows the consensus statement: Guidelines for Management of Incidental Pulmonary Nodules Detected on CT Images: From the Fleischner Society 2017; Radiology 2017; 284:228-243. 3. There is a 2 cm LEFT thyroid nodule. Recommend nonemergent thyroid US (ref: J Am Coll Radiol. 2015 Feb;12(2): 143-50). Aortic Atherosclerosis (ICD10-I70.0). These results were called by telephone at the time of interpretation on 06/24/2021 at 6:06 pm to provider [facility] , who verbally acknowledged these results. Electronically Signed   By: Valentino Saxon M.D.   On: 06/24/2021 18:11   DG Chest Port 1 View  Result Date: 06/24/2021 CLINICAL DATA:  Pre ECT EXAM: PORTABLE CHEST 1 VIEW COMPARISON:  June 23, 2021 FINDINGS: The cardiomediastinal silhouette is unchanged in contour.Tortuous  thoracic aorta. The enteric tube courses through the chest to the abdomen  beyond the field-of-view. No pleural effusion. No pneumothorax. Similar appearance of a LEFT upper lung nodular opacity. Scattered linear opacities most consistent with atelectasis. Visualized abdomen is unremarkable. IMPRESSION: 1. Stable chest radiograph. Similar appearance of a possible nodular opacity versus summation artifact of overlapping vascular shadows of the LEFT upper lung. When clinically appropriate, chest CT with contrast is recommended versus follow-up PA and lateral chest radiograph in 3-4 weeks. Electronically Signed   By: Valentino Saxon M.D.   On: 06/24/2021 17:17    Scheduled Meds:  sodium chloride   Intravenous Once   amoxicillin-clavulanate  1 tablet Oral Q12H   apixaban  5 mg Oral BID   Chlorhexidine Gluconate Cloth  6 each Topical Daily   cyanocobalamin  1,000 mcg Intramuscular Daily   free water  100 mL Per Tube Q4H   haloperidol lactate  0.5 mg Intravenous QHS   mirtazapine  7.5 mg Per Tube QHS   multivitamin with minerals  1 tablet Per Tube Daily   sodium bicarbonate  650 mg Per Tube BID   tamsulosin  0.4 mg Oral QPC supper   vancomycin  125 mg Oral QID   Vitamin D (Ergocalciferol)  50,000 Units Oral Q7 days   Continuous Infusions:  dextrose 50 mL/hr at 06/25/21 1937   feeding supplement (OSMOLITE 1.2 CAL)     thiamine 500mg  in normal saline (64ml) IVPB 500 mg (06/24/21 2156)    LOS: 6 days   Kerney Elbe, DO [facility] PAGER is on AMION  If 7PM-7AM, please contact night-coverage [email or web address]

## 2021-06-25 NOTE — Anesthesia Preprocedure Evaluation (Addendum)
Anesthesia Evaluation  Patient identified by MRN, date of birth, ID band Patient confused  General Assessment Comment:  Severe depression with psychosis. She has been deemed to not have medical decision making capacity currently. Patient consistently just says "no" at anything I say to her. Patient refusing intervention or treatment  Reviewed: Allergy & Precautions, NPO status , Patient's Chart, lab work & pertinent test results  History of Anesthesia Complications Negative for: history of anesthetic complications  Airway Mallampati: Unable to assess  TM Distance: >3 FB    Comment: Patient not participating in airway exam Dental  (+) Edentulous Upper, Edentulous Lower   Pulmonary neg sleep apnea, pneumonia, resolved, neg COPD, Patient abstained from smoking.Not current smoker, former smoker, PE PE on imaging of chest incidental; unclear if acute or chronic, no evidence of heart strain   breath sounds clear to auscultation       Cardiovascular Exercise Tolerance: Poor METShypertension, + DVT  (-) CAD and (-) Past MI (-) dysrhythmias  Rhythm:Regular Rate:Normal  1. Left ventricular ejection fraction, by estimation, is 45 to 50%. The  left ventricle has mildly decreased function. The left ventricle has no  regional wall motion abnormalities. There is mild concentric left  ventricular hypertrophy. Left ventricular  diastolic parameters are consistent with Grade I diastolic dysfunction  (impaired relaxation).  2. Right ventricular systolic function is normal. The right ventricular  size is normal. There is normal pulmonary artery systolic pressure.  3. Mild mitral valve regurgitation.  4. Aortic valve regurgitation is not visualized.  5. Ectactic aorta. 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  6. The inferior vena cava is normal in size with greater than 50%  respiratory variability, suggesting right atrial pressure of 3  mmHg.    Neuro/Psych PSYCHIATRIC DISORDERS Anxiety Depression negative neurological ROS     GI/Hepatic GERD  ,(+)     (-) substance abuse  ,   Endo/Other  neg diabetes  Renal/GU ARFRenal disease     Musculoskeletal   Abdominal   Peds  Hematology  (+) anemia ,   Anesthesia Other Findings Past Medical History: No date: Anxiety No date: Hypertension  Reproductive/Obstetrics                           Anesthesia Physical Anesthesia Plan  ASA: 3  Anesthesia Plan: General   Post-op Pain Management: Minimal or no pain anticipated   Induction: Intravenous  PONV Risk Score and Plan: 3 and Ondansetron and TIVA  Airway Management Planned: Mask and Oral ETT  Additional Equipment: None  Intra-op Plan:   Post-operative Plan:   Informed Consent: I have reviewed the patients History and Physical, chart, labs and discussed the procedure including the risks, benefits and alternatives for the proposed anesthesia with the patient or authorized representative who has indicated his/her understanding and acceptance.   Patient has DNR.  Suspend DNR.   Dental advisory given and Consent reviewed with POA  Plan Discussed with: CRNA and Surgeon  Anesthesia Plan Comments: (Discussed risks of anesthesia with patient's daughter/POA [name] by phone, including PONV, muscle aches. Rare risks discussed as well, such as cardiorespiratory sequelae, need for airway intervention and its associated risks including lip/dental/eye damage and sore throat, aspiration, and allergic reactions. Discussed the role of CRNA in patient's perioperative care. She understands.)        Anesthesia Quick Evaluation

## 2021-06-25 NOTE — TOC Progression Note (Signed)
Transition of Care [facility]) - Progression Note    Patient Details  Name: Amber [name] MRN: [id number] Date of Birth: 1945/07/04  Transition of Care [facility]) CM/SW [place], RN Phone Number: 06/25/2021, 10:29 AM  Clinical Narrative:  As per MD note on 1/29, will wait a couple of more days for ECT treatment.  TOC to follow for needs.     Expected Discharge Plan:  (TBD) Barriers to Discharge: Continued Medical Work up  Expected Discharge Plan and Services Expected Discharge Plan:  (TBD)   Discharge Planning Services: CM Consult Post Acute Care Choice:  (TBD) Living arrangements for the past 2 months: Single Family Home                                       Social Determinants of Health (SDOH) Interventions    Readmission Risk Interventions No flowsheet data found.

## 2021-06-26 LAB — COMPREHENSIVE METABOLIC PANEL
ALT: 17 U/L (ref 0–44)
AST: 13 U/L — ABNORMAL LOW (ref 15–41)
Albumin: 2 g/dL — ABNORMAL LOW (ref 3.5–5.0)
Alkaline Phosphatase: 55 U/L (ref 38–126)
Anion gap: 5 (ref 5–15)
BUN: 17 mg/dL (ref 8–23)
CO2: 23 mmol/L (ref 22–32)
Calcium: 9 mg/dL (ref 8.9–10.3)
Chloride: 109 mmol/L (ref 98–111)
Creatinine, Ser: 0.94 mg/dL (ref 0.44–1.00)
GFR, Estimated: >60 mL/min (ref >60)
Glucose, Bld: 95 mg/dL (ref 70–99)
Potassium: 3.9 mmol/L (ref 3.5–5.1)
Sodium: 137 mmol/L (ref 135–145)
Total Bilirubin: 0.4 mg/dL (ref 0.3–1.2)
Total Protein: 4.6 g/dL — ABNORMAL LOW (ref 6.5–8.1)

## 2021-06-26 LAB — GLUCOSE, CAPILLARY
Glucose-Capillary: 116 mg/dL — ABNORMAL HIGH (ref 70–99)
Glucose-Capillary: 102 mg/dL — ABNORMAL HIGH (ref 70–99)
Glucose-Capillary: 119 mg/dL — ABNORMAL HIGH (ref 70–99)
Glucose-Capillary: 108 mg/dL — ABNORMAL HIGH (ref 70–99)

## 2021-06-26 LAB — CBC WITH DIFFERENTIAL/PLATELET
Abs Immature Granulocytes: 0.08 10*3/uL — ABNORMAL HIGH (ref 0.00–0.07)
Basophils Absolute: 0 10*3/uL (ref 0.0–0.1)
Basophils Relative: 0 %
Eosinophils Absolute: 0.1 10*3/uL (ref 0.0–0.5)
Eosinophils Relative: 1 %
HCT: 28.4 % — ABNORMAL LOW (ref 36.0–46.0)
Hemoglobin: 9 g/dL — ABNORMAL LOW (ref 12.0–15.0)
Immature Granulocytes: 1 %
Lymphocytes Relative: 6 %
Lymphs Abs: 0.6 10*3/uL — ABNORMAL LOW (ref 0.7–4.0)
MCH: 29.6 pg (ref 26.0–34.0)
MCHC: 31.7 g/dL (ref 30.0–36.0)
MCV: 93.4 fL (ref 80.0–100.0)
Monocytes Absolute: 0.4 10*3/uL (ref 0.1–1.0)
Monocytes Relative: 4 %
Neutro Abs: 8.8 10*3/uL — ABNORMAL HIGH (ref 1.7–7.7)
Neutrophils Relative %: 88 %
Platelets: 278 10*3/uL (ref 150–400)
RBC: 3.04 MIL/uL — ABNORMAL LOW (ref 3.87–5.11)
RDW: 15.1 % (ref 11.5–15.5)
WBC: 10 10*3/uL (ref 4.0–10.5)
nRBC: 0 % (ref 0.0–0.2)

## 2021-06-26 LAB — PHOSPHORUS: Phosphorus: 2.9 mg/dL (ref 2.5–4.6)

## 2021-06-26 LAB — LACTATE DEHYDROGENASE: LDH: 97 U/L — ABNORMAL LOW (ref 98–192)

## 2021-06-26 LAB — MAGNESIUM: Magnesium: 2 mg/dL (ref 1.7–2.4)

## 2021-06-26 MED ORDER — SODIUM CHLORIDE 0.9 % IV BOLUS
500.0000 mL | Freq: Once | INTRAVENOUS | Status: AC
  Administered 2021-06-26: 05:00:00 500 mL via INTRAVENOUS

## 2021-06-26 MED ORDER — AMOXICILLIN-POT CLAVULANATE 875-125 MG PO TABS
1.0000 | ORAL_TABLET | Freq: Two times a day (BID) | ORAL | Status: AC
  Filled 2021-06-26: qty 1

## 2021-06-26 NOTE — Progress Notes (Signed)
°   06/25/21 2051  Assess: MEWS Score  Temp 97.7 F (36.5 C)  BP 115/69  Pulse Rate (!) 113  Resp 16  SpO2 99 %  O2 Device Room Air  Assess: MEWS Score  MEWS Temp 0  MEWS Systolic 0  MEWS Pulse 2  MEWS RR 0  MEWS LOC 0  MEWS Score 2  MEWS Score Color Yellow  Assess: if the MEWS score is Yellow or Red  Were vital signs taken at a resting state? Yes  Focused Assessment No change from prior assessment  Does the patient meet 2 or more of the SIRS criteria? No  Early Detection of Sepsis Score *See Row Information* Low  MEWS guidelines implemented *See Row Information* Yes  Treat  MEWS Interventions Escalated (See documentation below)  Pain Scale 0-10  Pain Score 0  Take Vital Signs  Increase Vital Sign Frequency  Yellow: Q 2hr X 2 then Q 4hr X 2, if remains yellow, continue Q 4hrs  Escalate  MEWS: Escalate Yellow: discuss with charge nurse/RN and consider discussing with provider and RRT  Notify: Charge Nurse/RN  Name of Charge Nurse/RN Notified Geni Bers  Date Charge Nurse/RN Notified 06/25/21  Time Charge Nurse/RN Notified 2055  Assess: SIRS CRITERIA  SIRS Temperature  0  SIRS Pulse 1  SIRS Respirations  0  SIRS WBC 0  SIRS Score Sum  1

## 2021-06-26 NOTE — Consult Note (Signed)
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[place]) Active Problems:   Moderate protein-calorie malnutrition ([place])   Primary hyperparathyroidism ([place])   Dysphagia   COVID-19 virus infection 06/08/2021   Nasogastric tube present   Chronic deep vein thrombosis (DVT) (HCC)   Chronic anticoagulation   ABLA (acute blood loss anemia)   Acute urinary retention   Foley catheter in place   Stage 3a chronic kidney disease (HCC)   At high risk for inadequate nutritional intake   Gross hematuria   Fever   Aspiration pneumonia of right lung due to gastric secretions (HCC)   Total Time spent with patient: 30 minutes  Subjective:   Amber [name] is a 76 y.o. female patient admitted with "I do not think I like how it was done".  HPI: Patient seen and chart reviewed.  Patient had her first ECT treatment yesterday.  The treatment itself went very well without any significant complication.  Found patient today initially partially asleep but easy to wake up.  She sat up in bed and made eye contact.  Patient engaged in a more lucid conversation.  She had some complaints about having had the ECT treatment from her perspective sprung on her as a surprise yesterday.  Nevertheless she did not flat out say she would refuse tomorrow's treatment and it was clear that her affect seemed brighter and more check  Past Psychiatric History: The.  She still had not eaten anything by mouth today.  History of severe negativism and depression getting worse over months  Risk to Self:   Risk to Others:   Prior Inpatient Therapy:   Prior Outpatient Therapy:    Past Medical History:  Past  Medical History:  Diagnosis Date   Anxiety    Hypertension    History reviewed. No pertinent surgical history. Family History:  Family History  Problem Relation Age of Onset   High blood pressure Maternal Grandmother    Family Psychiatric  History: See previous Social History:  Social History   Substance and Sexual Activity  Alcohol Use Not Currently     Social History   Substance and Sexual Activity  Drug Use Not Currently    Social History   Socioeconomic History   Marital status: Married    Spouse name: Not on file   Number of children: Not on file   Years of education: Not on file   Highest education level: Not on file  Occupational History   Not on file  Tobacco Use   Smoking status: Former    Types: Cigarettes   Smokeless tobacco: Never  Vaping Use   Vaping Use: Never used  Substance and Sexual Activity   Alcohol use: Not Currently   Drug use: Not Currently   Sexual activity: Not Currently  Other Topics Concern   Not on file  Social History Narrative   Right handed    one story home    lives with family    Doesn't work currently   Social Determinants of Radio broadcast assistant Strain: Not on file  Food Insecurity: Not on file  Transportation Needs: Not on file  Physical Activity: Not on file  Stress: Not on file  Social Connections: Not on file  Additional Social History:    Allergies:   Allergies  Allergen Reactions   Shellfish Allergy     "I pass out"    Labs:  Results for orders placed or performed during the hospital encounter of 06/19/21 (from the past 48 hour(s))  Glucose, capillary     Status: Abnormal   Collection Time: 06/24/21  4:45 PM  Result Value Ref Range   Glucose-Capillary 101 (H) 70 - 99 mg/dL    Comment: Glucose reference range applies only to samples taken after fasting for at least 8 hours.  Glucose, capillary     Status: None   Collection Time: 06/24/21  7:56 PM  Result Value Ref Range   Glucose-Capillary  98 70 - 99 mg/dL    Comment: Glucose reference range applies only to samples taken after fasting for at least 8 hours.  Glucose, capillary     Status: None   Collection Time: 06/25/21  1:36 AM  Result Value Ref Range   Glucose-Capillary 80 70 - 99 mg/dL    Comment: Glucose reference range applies only to samples taken after fasting for at least 8 hours.  Glucose, capillary     Status: None   Collection Time: 06/25/21  5:09 AM  Result Value Ref Range   Glucose-Capillary 76 70 - 99 mg/dL    Comment: Glucose reference range applies only to samples taken after fasting for at least 8 hours.  Comprehensive metabolic panel     Status: Abnormal   Collection Time: 06/25/21  6:40 AM  Result Value Ref Range   Sodium 139 135 - 145 mmol/L   Potassium 4.2 3.5 - 5.1 mmol/L   Chloride 111 98 - 111 mmol/L   CO2 21 (L) 22 - 32 mmol/L   Glucose, Bld 88 70 - 99 mg/dL    Comment: Glucose reference range applies only to samples taken after fasting for at least 8 hours.   BUN 15 8 - 23 mg/dL   Creatinine, Ser 0.87 0.44 - 1.00 mg/dL   Calcium 9.1 8.9 - 10.3 mg/dL   Total Protein 4.4 (L) 6.5 - 8.1 g/dL   Albumin 1.8 (L) 3.5 - 5.0 g/dL   AST 14 (L) 15 - 41 U/L   ALT 21 0 - 44 U/L   Alkaline Phosphatase 50 38 - 126 U/L   Total Bilirubin 0.4 0.3 - 1.2 mg/dL   GFR, Estimated >60 >60 mL/min    Comment: (NOTE) Calculated using the CKD-EPI Creatinine Equation (2021)    Anion gap 7 5 - 15    Comment: Performed at [facility], [address] [place], [place] [address]  CBC with Differential/Platelet     Status: Abnormal   Collection Time: 06/25/21  6:40 AM  Result Value Ref Range   WBC 5.2 4.0 - 10.5 K/uL   RBC 2.70 (L) 3.87 - 5.11 MIL/uL   Hemoglobin 8.1 (L) 12.0 - 15.0 g/dL   HCT 25.6 (L) 36.0 - 46.0 %   MCV 94.8 80.0 - 100.0 fL   MCH 30.0 26.0 - 34.0 pg   MCHC 31.6 30.0 - 36.0 g/dL   RDW 15.1 11.5 - 15.5 %   Platelets 224 150 - 400 K/uL   nRBC 0.0 0.0 - 0.2 %   Neutrophils Relative % 65  %   Neutro Abs 3.4 1.7 - 7.7 K/uL   Lymphocytes Relative 25 %   Lymphs Abs 1.3 0.7 - 4.0 K/uL   Monocytes Relative 6 %   Monocytes Absolute 0.3  0.1 - 1.0 K/uL   Eosinophils Relative 2 %   Eosinophils Absolute 0.1 0.0 - 0.5 K/uL   Basophils Relative 0 %   Basophils Absolute 0.0 0.0 - 0.1 K/uL   Immature Granulocytes 2 %   Abs Immature Granulocytes 0.08 (H) 0.00 - 0.07 K/uL    Comment: Performed at [facility], [address] [place], [place] [address]  Lactate dehydrogenase     Status: None   Collection Time: 06/25/21  6:40 AM  Result Value Ref Range   LDH 121 98 - 192 U/L    Comment: Performed at [facility], [address], [place], [place] [address]  Magnesium     Status: None   Collection Time: 06/25/21  6:40 AM  Result Value Ref Range   Magnesium 2.1 1.7 - 2.4 mg/dL    Comment: Performed at [facility], [address] [place], [place] [address]  Glucose, capillary     Status: None   Collection Time: 06/25/21  7:57 AM  Result Value Ref Range   Glucose-Capillary 86 70 - 99 mg/dL    Comment: Glucose reference range applies only to samples taken after fasting for at least 8 hours.  Glucose, capillary     Status: None   Collection Time: 06/25/21  4:23 PM  Result Value Ref Range   Glucose-Capillary 91 70 - 99 mg/dL    Comment: Glucose reference range applies only to samples taken after fasting for at least 8 hours.  Glucose, capillary     Status: Abnormal   Collection Time: 06/25/21  9:05 PM  Result Value Ref Range   Glucose-Capillary 100 (H) 70 - 99 mg/dL    Comment: Glucose reference range applies only to samples taken after fasting for at least 8 hours.  Glucose, capillary     Status: Abnormal   Collection Time: 06/26/21  1:01 AM  Result Value Ref Range   Glucose-Capillary 116 (H) 70 - 99 mg/dL    Comment: Glucose reference range applies only to samples taken after fasting for at least 8 hours.  Glucose, capillary     Status: Abnormal    Collection Time: 06/26/21  5:00 AM  Result Value Ref Range   Glucose-Capillary 102 (H) 70 - 99 mg/dL    Comment: Glucose reference range applies only to samples taken after fasting for at least 8 hours.  Lactate dehydrogenase     Status: Abnormal   Collection Time: 06/26/21  5:47 AM  Result Value Ref Range   LDH 97 (L) 98 - 192 U/L    Comment: Performed at [facility], [address] [place], [place] [address]  CBC with Differential/Platelet     Status: Abnormal   Collection Time: 06/26/21  5:47 AM  Result Value Ref Range   WBC 10.0 4.0 - 10.5 K/uL   RBC 3.04 (L) 3.87 - 5.11 MIL/uL   Hemoglobin 9.0 (L) 12.0 - 15.0 g/dL   HCT 28.4 (L) 36.0 - 46.0 %   MCV 93.4 80.0 - 100.0 fL   MCH 29.6 26.0 - 34.0 pg   MCHC 31.7 30.0 - 36.0 g/dL   RDW 15.1 11.5 - 15.5 %   Platelets 278 150 - 400 K/uL   nRBC 0.0 0.0 - 0.2 %   Neutrophils Relative % 88 %   Neutro Abs 8.8 (H) 1.7 - 7.7 K/uL   Lymphocytes Relative 6 %   Lymphs Abs 0.6 (L) 0.7 - 4.0 K/uL   Monocytes Relative 4 %  Monocytes Absolute 0.4 0.1 - 1.0 K/uL   Eosinophils Relative 1 %   Eosinophils Absolute 0.1 0.0 - 0.5 K/uL   Basophils Relative 0 %   Basophils Absolute 0.0 0.0 - 0.1 K/uL   Immature Granulocytes 1 %   Abs Immature Granulocytes 0.08 (H) 0.00 - 0.07 K/uL    Comment: Performed at [facility], [address] [place], [place] [address]  Comprehensive metabolic panel     Status: Abnormal   Collection Time: 06/26/21  5:47 AM  Result Value Ref Range   Sodium 137 135 - 145 mmol/L   Potassium 3.9 3.5 - 5.1 mmol/L   Chloride 109 98 - 111 mmol/L   CO2 23 22 - 32 mmol/L   Glucose, Bld 95 70 - 99 mg/dL    Comment: Glucose reference range applies only to samples taken after fasting for at least 8 hours.   BUN 17 8 - 23 mg/dL   Creatinine, Ser 0.94 0.44 - 1.00 mg/dL   Calcium 9.0 8.9 - 10.3 mg/dL   Total Protein 4.6 (L) 6.5 - 8.1 g/dL   Albumin 2.0 (L) 3.5 - 5.0 g/dL   AST 13 (L) 15 - 41 U/L   ALT 17 0 -  44 U/L   Alkaline Phosphatase 55 38 - 126 U/L   Total Bilirubin 0.4 0.3 - 1.2 mg/dL   GFR, Estimated >60 >60 mL/min    Comment: (NOTE) Calculated using the CKD-EPI Creatinine Equation (2021)    Anion gap 5 5 - 15    Comment: Performed at [facility], [address] [place], [place] [address]  Magnesium     Status: None   Collection Time: 06/26/21  5:47 AM  Result Value Ref Range   Magnesium 2.0 1.7 - 2.4 mg/dL    Comment: Performed at [facility], [address], [place], [place] [address]  Phosphorus     Status: None   Collection Time: 06/26/21  5:47 AM  Result Value Ref Range   Phosphorus 2.9 2.5 - 4.6 mg/dL    Comment: Performed at [facility], [address] [place],  [address]  Glucose, capillary     Status: Abnormal   Collection Time: 06/26/21  7:50 AM  Result Value Ref Range   Glucose-Capillary 119 (H) 70 - 99 mg/dL    Comment: Glucose reference range applies only to samples taken after fasting for at least 8 hours.  Glucose, capillary     Status: Abnormal   Collection Time: 06/26/21 12:40 PM  Result Value Ref Range   Glucose-Capillary 108 (H) 70 - 99 mg/dL    Comment: Glucose reference range applies only to samples taken after fasting for at least 8 hours.    Current Facility-Administered Medications  Medication Dose Route Frequency Provider Last Rate Last Admin   0.9 %  sodium chloride infusion (Manually program via Guardrails IV Fluids)   Intravenous Once Athena Masse, MD       acetaminophen (TYLENOL) tablet 650 mg  650 mg Oral Q6H PRN Athena Masse, MD   650 mg at 06/25/21 1601   Or   acetaminophen (TYLENOL) suppository 650 mg  650 mg Rectal Q6H PRN Athena Masse, MD       amoxicillin-clavulanate (AUGMENTIN) 875-125 MG per tablet 1 tablet  1 tablet Oral Q12H Sheikh, Omair Latif, DO       apixaban (ELIQUIS) tablet 5 mg  5 mg Oral BID Sheikh, Omair Latif, DO   5 mg at  06/26/21 0902   Chlorhexidine Gluconate Cloth 2 %  PADS 6 each  6 each Topical Daily Raiford Noble [place], [place]   6 each at 06/26/21 1219   cyanocobalamin ((VITAMIN B-12)) injection 1,000 mcg  1,000 mcg Intramuscular Daily Judd Gaudier V, MD   1,000 mcg at 06/26/21 0902   dextrose 10 % infusion   Intravenous Continuous Foust, Katy L, NP 50 mL/hr at 06/26/21 5883 New Bag at 06/26/21 2549   feeding supplement (OSMOLITE 1.2 CAL) liquid 1,000 mL  1,000 mL Per Tube Continuous Raiford Noble [place], DO 65 mL/hr at 06/26/21 0901 1,000 mL at 06/26/21 0901   free water 100 mL  100 mL Per Tube Q4H Judd Gaudier V, MD   100 mL at 06/26/21 1218   haloperidol lactate (HALDOL) injection 0.5 mg  0.5 mg Intravenous QHS Judd Gaudier V, MD   0.5 mg at 06/25/21 2104   mirtazapine (REMERON) tablet 7.5 mg  7.5 mg Per Tube QHS Athena Masse, MD   7.5 mg at 06/25/21 2102   multivitamin with minerals tablet 1 tablet  1 tablet Per Tube Daily Raiford Noble [place], DO   1 tablet at 06/26/21 0901   ondansetron (ZOFRAN) tablet 4 mg  4 mg Oral Q6H PRN Athena Masse, MD       Or   ondansetron [facility]) injection 4 mg  4 mg Intravenous Q6H PRN Athena Masse, MD       sodium bicarbonate tablet 650 mg  650 mg Per Tube BID Athena Masse, MD   650 mg at 06/26/21 0901   tamsulosin (FLOMAX) capsule 0.4 mg  0.4 mg Oral QPC supper Athena Masse, MD   0.4 mg at 06/25/21 1825   thiamine 500mg  in normal saline (91ml) IVPB  500 mg Intravenous BID Judd Gaudier V, MD 100 mL/hr at 06/26/21 1418 500 mg at 06/26/21 1418   vancomycin (VANCOCIN) 50 mg/mL oral solution SOLN 125 mg  125 mg Per Tube Q6H Foust, Katy L, NP   125 mg at 06/26/21 1311   Vitamin D (Ergocalciferol) (DRISDOL) capsule 50,000 Units  50,000 Units Oral Q7 days Athena Masse, MD        Musculoskeletal: Strength & Muscle Tone: decreased Gait & Station: unable to stand Patient leans: N/A            Psychiatric Specialty Exam:  Presentation  General Appearance: -- (in hospital bed, had to pull covers down to  examine)  Eye Contact:Minimal  Speech:Clear and Coherent  Speech Volume:Decreased  Handedness:Right   Mood and Affect  Mood:-- ("I don't want to talk")  Affect:Congruent   Thought Process  Thought Processes:-- (poverty)  Descriptions of Associations:Intact  Orientation:Full (Time, Place and Person)  Thought Content:-- (devoid of SI, HI, moderate improvement in delusions)  History of Schizophrenia/Schizoaffective disorder:No  Duration of Psychotic Symptoms:Less than six months  Hallucinations:No data recorded Ideas of Reference:None  Suicidal Thoughts:No data recorded Homicidal Thoughts:No data recorded  Sensorium  Memory:Immediate Fair; Recent Poor; Remote Poor  Judgment:Poor  Insight:Lacking   Executive Functions  Concentration:Poor  Attention Span:Poor  [place] of Knowledge:Poor  Language:Poor   Psychomotor Activity  Psychomotor Activity:No data recorded  Assets  Assets:Social Support   Sleep  Sleep:No data recorded  Physical Exam: Physical Exam Vitals and nursing note reviewed.  HENT:     Head: Normocephalic and atraumatic.     Mouth/Throat:     Pharynx: Oropharynx is clear.  Eyes:     Pupils: Pupils  are equal, round, and reactive to light.  Cardiovascular:     Rate and Rhythm: Normal rate and regular rhythm.  Pulmonary:     Effort: Pulmonary effort is normal.     Breath sounds: Normal breath sounds.  Abdominal:     General: Abdomen is flat.     Palpations: Abdomen is soft.  Musculoskeletal:        General: Normal range of motion.  Skin:    General: Skin is warm and dry.  Neurological:     General: No focal deficit present.     Mental Status: She is alert. Mental status is at baseline.  Psychiatric:        Attention and Perception: She is inattentive.        Mood and Affect: Mood normal. Affect is blunt.        Speech: Speech is tangential.        Behavior: Behavior is cooperative.        Thought Content:  Thought content normal.        Cognition and Memory: Cognition is impaired. Memory is impaired.   Review of Systems  Unable to perform ROS: Psychiatric disorder  Blood pressure 101/76, pulse 85, temperature 98.8 F (37.1 C), temperature source Oral, resp. rate 16, height 5\' 6"  (1.676 m), weight 69.1 kg, SpO2 100 %. Body mass index is 24.59 kg/m.  Treatment Plan Summary: Plan patient may be showing a positive response to ECT already.  Certainly does not seem to be having any complications from it.  I tried to form some rapport with the patient and explained that she looked to me like she was already doing a little better.  Expressed empathy at how ECT yesterday to some extent had to be done despite her opposition.  Expressed strongly my wish that she would be fully cooperative tomorrow as I think the treatment is going to continue to work.  No orders to change any medicine.  I will put in n.p.o. and tube feeds stops for tomorrow.  Disposition:  continue ect  Alethia Berthold, MD 06/26/2021 3:35 PM

## 2021-06-26 NOTE — Progress Notes (Signed)
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 Weber Cooks of psychiatry.  She was admitted to [facility] from 06/08/21-06/19/21 after presenting with failure to thrive following a several week history of complaints of difficulty swallowing with decreased oral intake and subsequent development of confusion and hallucinations.  She had outpatient work-up include brain MRI as well as MRV which was unremarkable.  On admission she is found to have an AKI and several electrolyte abnormalities.  While at [facility] she refused to eat and refused further evaluation with esophagram to evaluate her sensation of dysphagia.  She is evaluated by psychiatry who determined her to have capacity for medical decision-making and so an NG tube was placed for force-feeding.  Palliative care consult was also done.  Due to her poor response to oral antidepressants ECG was recommended and thus arranged to transfer to [facility].  On 06/08/2020 she tested positive for COVID and she received 3 days of remdesivir.  Most of her nutritional deficiencies related to inadequate oral intake were corrected prior to transfer and on the day of discharge she developed acute urinary tension a Foley catheter was placed and it was traumatic insertion with gross development of gross hematuria.  Hemoglobin did drop to 7.0 and repeat is pending.   On the afternoon of 06/21/21 she spiked a temperature of 101.9 and became tachycardic and slightly hypotensive.  She is given IV fluid bolus and she is pancultured.  Further work-up revealed that she had a right lower lobe  pneumonia so she was started on IV Unasyn.  She had some loose stools and this was tested for C. difficile however we can consider C. difficile colonization given that she no longer spiking temperatures and afebrile and has no leukocytosis.  SLP evaluated and recommending a clear liquid diet with thins in addition to an NG tube.  Given that she has a recent pneumonia and anesthesia felt it would be safer to wait a couple more days and for ECT treatment until that time to help clear her pneumonia.  She has now undergone ECT treatment Monday, 06/25/2021. Changed her antibiotics from IV Unasyn to p.o. Augmentin for her Pneunmonia and now they are to stop today. Given that she continues to have some mild loose stools I spoke with ID who feels that since the patient was positive toxin producing C. difficile empirically treat with vancomycin for 10 days.  Foley catheter has been removed.  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[place]) Active Problems:   Moderate protein-calorie malnutrition ([place])   Primary hyperparathyroidism ([place])   Dysphagia   COVID-19 virus infection 06/08/2021   Nasogastric tube present   Chronic deep vein thrombosis (DVT) (H[facility])   Chronic anticoagulation   ABLA (acute blood loss anemia)   Acute urinary retention   Foley catheter in place   Stage 3a chronic kidney disease ([place])   At high risk for inadequate nutritional intake   Gross hematuria   Fever   Aspiration pneumonia of right lung due to gastric secretions ([place])  Psychotic depression placing patient at high nutritional risk  Need for electroconvulsive therapy -Psych consult to evaluate for ECT Per arrangements made prior to transfer -Continue mirtazapine 7.5 mg per tube via NGT -Further care per psychiatry and they did an ECT treatment on 06/25/2021 with a the next 1 on 06/27/2021   Inadequate oral nutritional intake  Nutritional deficiencies requiring  NGT for force-feeding Moderate protein-calorie malnutrition ([place]) Dysphagia of unknown etiology -Nutritionist consult for tube feeds - Continue nutritional supplementation via NG tube - Patient has refused further evaluation of dysphagia to evaluate for organic etiology -She does not have the capacity so we will need to get SLP involved again -Continue with Osmolite per nutritionist recommendations and I have started the patient on clears and will continue and see how she responds to ECT to remove NG tube -Have also consulted speech who recommended clear liquid diet and advancing diet once the CT is completed  Sepsis 2/2 Aspiration PNA  -Became febrile and tachycardic this afternoon as well as hypotensive slightly; likely could have aspirated with her NG tube in place -Initiate IV Unasyn and will continue and she received 3 days of antibiotics and will change to p.o. Augmentin and will complete today she will get 7 days total treatment her fevers have since resolved and WBC is improved -Given IV fluid boluses 2 L and we will panculture and obtain blood cultures x2, urinalysis and urine culture as well as chest x-ray -Chest x-ray done and showed "There is new patchy infiltrate in right lower lung fields suggesting atelectasis/pneumonia. There are transverse linear densities in the left parahilar region suggesting scarring or subsegmental atelectasis or lung nodules." -We will order flutter valve and incentive spirometer -C. difficile was checked and she is positive for toxin producing C. difficile however given that she does not have a white count we will not currently treat as we believe this may be a colonization -Remains significantly withdrawn but not complaining of any shortness of breath -Repeat chest x-ray today shows "1.7 x 0.8 cm left upper lobe opacity concerning for nodule. Chest CT recommended, preferably with contrast. No appreciable focal pneumonia. Cardiomegaly." -CT of the Chest  with Contrast done and it showed "Nodular opacity seen on chest radiograph corresponds to a ground-glass opacity with internal lucency in the superior aspect of the LEFT lower lobe. Differential considerations include infection, sequela of pulmonary infarction, or neoplasm. Recommend follow-up CT in 3 months" -The CT of the chest also showed "There is peripherally located pulmonary embolism located most centrally within the main branches of the pulmonary artery. It is nonocclusive. No CT evidence of RIGHT heart strain. This is consistent with age-indeterminate pulmonary emboli." -Patient is already on anticoagulation and will continue  Suspected Lung Nodule -As Above noted to be 1.7 x 0.8 cm Left Upper Lobe Opacity -CT of the Chest with Contrast done and it showed "Nodular opacity seen on chest radiograph corresponds to a ground-glass opacity with internal lucency in the superior aspect of the LEFT lower lobe. Differential considerations include infection, sequela of pulmonary infarction, or neoplasm. Recommend follow-up CT in 3 months"  C. Difficile Diarrhea -Clinically she does not really have signs and symptoms for C. difficile given that she is afebrile now and has no leukocytosis -The tube feedings are likely causing the patient's loose stools however we cannot confirm the patient has actual C. Difficile as we do not have PCR testing here  -ID recommends just treating empirically with vancomycin 125 mg p.o. 4 times daily for  10 days  Bacteriuria and pyuria without evidence of infection -When she spiked a temperature few days ago we ordered a urinalysis which showed a hazy appearance with moderate hemoglobin, moderate leukocytes, negative nitrites, rare bacteria, 21-50 RBCs, no squamous epithelial cells, greater than 50 WBCs -Urine culture showed 70,000 colony-forming units of Pseudomonas which which was pansensitive but she is asymptomatic -Will not treat her Pseudomonas UTI as she has no  symptoms and will remove her Foley catheter -Foley catheter has been removed and will continue to monitor her I's and O's  Thrombocytopenia, improved -Patient's platelet count went from 181 ->144 -> 132 -> 151 -> 180 -> 222 -> 224 and is now 278 and improved -Has been bleeding in the urine after Foley catheter placement but discharge resolved -Continue monitor for signs and symptoms bleeding; currently her Foley has some darker red blood in it but this is improving -Repeat CBC in the a.m.   Acute urinary retention Foley catheter in place - Foley catheter was inserted but now removed to do a trial of void and move Foley catheter; Foley catheter has been removed and she has been urinating adequately --Continue Tamsulosin   ABLA (acute blood loss anemia) 1/24 Gross hematuria secondary to traumatic Foley -Hemoglobin continued to drop and trended down from 9.2/28.3 is now 6.7/21.0 so she was typed and screened and transfused 2 units of PRBCs and went to 8.2/25.4 -> 8.0/24.1 -> 8.1/25.0 -> 8.4/26.1 -> 8.6/26.6 -> 8.1/25.6 -> 9.0/28.4 with no other signs of bleeding -She is typed and screened and transfused 1 unit of PRBCs -Continued to trend H&H and transfuse if necessary(daughter consented via telephone) -One time NS bolus ordered for another few boluses as above -CBI may be necessary -Resumed anticoagulation with Eliquis   Chronic anticoagulation 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 - Initially was holding Eliquis and Lovenox at this time given her hemoglobin drop -SCDs for DVT prophylaxis -Resumed systemic anticoagulation with Eliquis and will continue now   COVID-19 virus infection 06/08/2021 - Not symptomatic -Completed remdesivir for 3-day -We will treat her aspiration pneumonia as above   Stage 3a chronic kidney disease ([place]) Primary hyperparathyroidism/vitamin D deficiency Metabolic acidosis - Continue supplementation via NG tube -Patient's BUNs/creatinine is now stable  at 17/0.94 -She has a mild metabolic acidosis with a CO2 of 23, anion gap of 5, chloride level of 109 -Avoid further nephrotoxic medications, contrast dyes, hypotension and dehydration and renally dose medications -Repeat CMP in the AM   Severe malnutrition related to chronic illness -Evidence by percent weight loss, severe fat depletion and severe muscle depletion -Nutritionist consulted for further evaluation recommendations and recommended initiating tube feedings via NG tube Osmolite 1.2 at 65 MLS per hour with 100 mL of free water flushes every 4 hours and her liquid multivitamin was discontinued and they recommended crushing multivitamin with minerals per tube daily  DVT prophylaxis: SCDs Code Status: DO NOT RESUSCITATE Family Communication: Discussed with the sister at bedside  Disposition Plan: Pending further clinical improvement and clearance by psychiatry  Status is: Inpatient  Remains inpatient appropriate because: She is spiking temperatures and will be getting ECT on Friday   Consultants:  Psychiatry  Discussed with ID Dr. Candiss Norse  Procedures: ECT  Antimicrobials:  Anti-infectives (From admission, onward)    Start     Dose/Rate Route Frequency Ordered Stop   06/26/21 2200  amoxicillin-clavulanate (AUGMENTIN) 875-125 MG per tablet 1 tablet        1 tablet Oral Every 12  hours 06/26/21 1120 06/27/21 0959   06/26/21 0100  vancomycin (VANCOCIN) 50 mg/mL oral solution SOLN 125 mg        125 mg Per Tube Every 6 hours 06/25/21 2018 07/03/21 2359   06/23/21 2200  amoxicillin-clavulanate (AUGMENTIN) 875-125 MG per tablet 1 tablet  Status:  Discontinued        1 tablet Oral Every 12 hours 06/23/21 1701 06/26/21 1120   06/23/21 1600  vancomycin (VANCOCIN) capsule 125 mg  Status:  Discontinued        125 mg Oral 4 times daily 06/23/21 1457 06/25/21 2018   06/20/21 1900  Ampicillin-Sulbactam (UNASYN) 3 g in sodium chloride 0.9 % 100 mL IVPB  Status:  Discontinued        3 g 200  mL/hr over 30 Minutes Intravenous Every 8 hours 06/20/21 1812 06/23/21 1701        Subjective: Seen and examined at bedside and was doing okay.  Had some bleeding from a skin tear on her right arm.  No nausea or vomiting.  GI complaints at this time and felt a little bit better after her ECT treatment.  No other concerns or complaints this time.  Objective: Vitals:   06/26/21 0459 06/26/21 0642 06/26/21 0753 06/26/21 1235  BP: 134/74  91/72 101/76  Pulse: (!) 122 (!) 112 (!) 116 85  Resp: 16  15 16   Temp: 98 F (36.7 C)  99.8 F (37.7 C) 98.8 F (37.1 C)  TempSrc:   Oral Oral  SpO2: 100%  100% 100%  Weight:      Height:        Intake/Output Summary (Last 24 hours) at 06/26/2021 1625 Last data filed at 06/26/2021 1146 Gross per 24 hour  Intake 2631.43 ml  Output 1550 ml  Net 1081.43 ml    Filed Weights   06/21/21 0112 06/24/21 0500 06/25/21 0500  Weight: 72.7 kg 63.1 kg 69.1 kg   Examination: Physical Exam:  Constitutional: WN/WD Caucasian female in NAD and appears calm  Eyes: Lids and conjunctivae normal, sclerae anicteric  ENMT: External Ears, Nose appear normal. Grossly normal hearing. Mucous membranes are moist. Cortrak in place Neck: Appears normal, supple, no cervical masses, normal ROM, no appreciable thyromegaly; no JVD Respiratory: Diminished to auscultation bilaterally, no wheezing, rales, rhonchi or crackles. Normal respiratory effort and patient is not tachypenic. No accessory muscle use. Unlabored breathing Cardiovascular: Tachycardic Rate, no murmurs / rubs / gallops. S1 and S2 auscultated. Mild LE edema Abdomen: Soft, non-tender, slightly-distended. Bowel sounds positive.  GU: Deferred. Musculoskeletal: No clubbing / cyanosis of digits/nails. No joint deformity upper and lower extremities.  Skin: No rashes, lesions, ulcers on a limited skin evaluation. No induration; Warm and dry.  Neurologic: CN 2-12 grossly intact with no focal deficits. Romberg sign and  cerebellar reflexes not assessed.  Psychiatric: Normal judgment and insight. Alert and oriented x 3. Slightly depressed mood and appropriate affect.   Data Reviewed: I have personally reviewed following labs and imaging studies  CBC: Recent Labs  Lab 06/22/21 0656 06/23/21 0633 06/24/21 0431 06/25/21 0640 06/26/21 0547  WBC 4.4 4.0 5.3 5.2 10.0  NEUTROABS 2.6 2.4 3.9 3.4 8.8*  HGB 8.1* 8.4* 8.6* 8.1* 9.0*  HCT 25.0* 26.1* 26.6* 25.6* 28.4*  MCV 93.3 92.9 90.8 94.8 93.4  PLT 151 180 222 224 272    Basic Metabolic Panel: Recent Labs  Lab 06/21/21 1705 06/22/21 0656 06/23/21 5366 06/24/21 0431 06/25/21 0640 06/26/21 0547  NA  --  137  138 138 139 137  K  --  3.9 3.8 4.2 4.2 3.9  CL  --  109 110 112* 111 109  CO2  --  22 23 23  21* 23  GLUCOSE  --  86 118* 108* 88 95  BUN  --  22 18 17 15 17   CREATININE  --  0.96 0.90 0.96 0.87 0.94  CALCIUM  --  8.9 8.8* 8.8* 9.1 9.0  MG 1.8 1.7 2.2 2.3 2.1 2.0  PHOS 2.7 2.8 2.9 3.4  --  2.9   GFR: Estimated Creatinine Clearance: 49.9 mL/min (by C-G formula based on SCr of 0.94 mg/dL). Liver Function Tests: Recent Labs  Lab 06/22/21 0656 06/23/21 8416 06/24/21 0431 06/25/21 0640 06/26/21 0547  AST 29 28 24  14* 13*  ALT 24 27 28 21 17   ALKPHOS 56 58 59 50 55  BILITOT 0.5 0.4 0.4 0.4 0.4  PROT 4.2* 4.5* 4.8* 4.4* 4.6*  ALBUMIN 1.7* 1.8* 2.0* 1.8* 2.0*    No results for input(s): LIPASE, AMYLASE in the last 168 hours. No results for input(s): AMMONIA in the last 168 hours.  Coagulation Profile: No results for input(s): INR, PROTIME in the last 168 hours. Cardiac Enzymes: No results for input(s): CKTOTAL, CKMB, CKMBINDEX, TROPONINI in the last 168 hours. BNP (last 3 results) No results for input(s): PROBNP in the last 8760 hours. HbA1C: No results for input(s): HGBA1C in the last 72 hours. CBG: Recent Labs  Lab 06/25/21 2105 06/26/21 0101 06/26/21 0500 06/26/21 0750 06/26/21 1240  GLUCAP 100* 116* 102* 119* 108*     Lipid Profile: No results for input(s): CHOL, HDL, LDLCALC, TRIG, CHOLHDL, LDLDIRECT in the last 72 hours. Thyroid Function Tests: No results for input(s): TSH, T4TOTAL, FREET4, T3FREE, THYROIDAB in the last 72 hours. Anemia Panel: No results for input(s): VITAMINB12, FOLATE, FERRITIN, TIBC, IRON, RETICCTPCT in the last 72 hours.  Sepsis Labs: No results for input(s): PROCALCITON, LATICACIDVEN in the last 168 hours.  Recent Results (from the past 240 hour(s))  CULTURE, BLOOD (ROUTINE X 2) w Reflex to ID Panel     Status: None   Collection Time: 06/20/21  4:42 PM   Specimen: BLOOD RIGHT ARM  Result Value Ref Range Status   Specimen Description BLOOD RIGHT ARM  Final   Special Requests   Final    BOTTLES DRAWN AEROBIC AND ANAEROBIC Blood Culture adequate volume   Culture   Final    NO GROWTH 5 DAYS Performed at [facility], [address] [place], [place] [address]    Report Status 06/25/2021 FINAL  Final  CULTURE, BLOOD (ROUTINE X 2) w Reflex to ID Panel     Status: None   Collection Time: 06/20/21  4:53 PM   Specimen: BLOOD RIGHT HAND  Result Value Ref Range Status   Specimen Description BLOOD RIGHT HAND  Final   Special Requests   Final    BOTTLES DRAWN AEROBIC AND ANAEROBIC Blood Culture adequate volume   Culture   Final    NO GROWTH 5 DAYS Performed at [facility], [address] [place], [place] [address]    Report Status 06/25/2021 FINAL  Final  Urine Culture     Status: Abnormal   Collection Time: 06/21/21  7:30 AM   Specimen: Urine, Random  Result Value Ref Range Status   Specimen Description   Final    URINE, RANDOM Performed at [facility], [address] [place], [place] [address]    Special Requests  Final    NONE Performed at [facility], [place], [place] [address]    Culture 70,000 COLONIES/mL PSEUDOMONAS AERUGINOSA (A)  Final   Report Status 06/23/2021 FINAL  Final   Organism ID,  Bacteria PSEUDOMONAS AERUGINOSA (A)  Final      Susceptibility   Pseudomonas aeruginosa - MIC*    CEFTAZIDIME 2 SENSITIVE Sensitive     CIPROFLOXACIN <=0.25 SENSITIVE Sensitive     GENTAMICIN <=1 SENSITIVE Sensitive     IMIPENEM 2 SENSITIVE Sensitive     PIP/TAZO <=4 SENSITIVE Sensitive     CEFEPIME 2 SENSITIVE Sensitive     * 70,000 COLONIES/mL PSEUDOMONAS AERUGINOSA  C Difficile Quick Screen w PCR reflex     Status: Abnormal   Collection Time: 06/21/21  1:48 PM   Specimen: STOOL  Result Value Ref Range Status   C Diff antigen POSITIVE (A) NEGATIVE Final   C Diff toxin POSITIVE (A) NEGATIVE Final   C Diff interpretation Toxin producing C. difficile detected.  Final    Comment: CRITICAL RESULT CALLED TO, READ BACK BY AND VERIFIED WITH: [name] HUSKNIS 06/21/21 1455 Performed at [facility], [address] [place], [place] [address]     RN Pressure Injury Documentation:     Estimated body mass index is 24.59 kg/m as calculated from the following:   Height as of this encounter: 5\' 6"  (1.676 m).   Weight as of this encounter: 69.1 kg.  Malnutrition Type: Nutrition Problem: Severe Malnutrition Etiology: chronic illness (major depressive disorder) Malnutrition Characteristics: Signs/Symptoms: percent weight loss, severe fat depletion, severe muscle depletion Percent weight loss: 6.5 % (x2 months) Nutrition Interventions: Interventions: Tube feeding Radiology Studies: CT CHEST W CONTRAST  Result Date: 06/24/2021 CLINICAL DATA:  Lung nodule, > 9mm Lung Nodule EXAM: CT CHEST WITH CONTRAST TECHNIQUE: Multidetector CT imaging of the chest was performed during intravenous contrast administration. RADIATION DOSE REDUCTION: This exam was performed according to the departmental dose-optimization program which includes automated exposure control, adjustment of the mA and/or kV according to patient size and/or use of iterative reconstruction technique. CONTRAST:  31mL OMNIPAQUE  IOHEXOL 300 MG/ML  SOLN COMPARISON:  Same day radiograph. FINDINGS: Cardiovascular: There is peripherally located pulmonary embolism located in both main pulmonary arteries. This is nonocclusive. No CT evidence of RIGHT heart strain. Heart is enlarged. Three-vessel coronary artery atherosclerotic calcifications. No pericardial effusion. LEFT vertebral artery arises directly from the aorta. Mediastinum/Nodes: LEFT thyroid nodule measuring approximately 20 mm. No suspicious axillary or mediastinal adenopathy. Lungs/Pleura: No pleural effusion or pneumothorax. Nodular opacity of the superior aspect of the LEFT lower lobe demonstrates several internal lucencies and spans 15 by 10 mm (series 2, image 50). Additional scattered ground-glass opacities throughout the LEFT lower lobe. Lingular atelectasis. Upper Abdomen: LEFT hepatic cyst. Enteric tube tip terminates in the duodenum. Diverticulosis. Musculoskeletal: Degenerative changes of the thoracic spine. IMPRESSION: 1. There is peripherally located pulmonary embolism located most centrally within the main branches of the pulmonary artery. It is nonocclusive. No CT evidence of RIGHT heart strain. This is consistent with age-indeterminate pulmonary emboli. 2. Nodular opacity seen on chest radiograph corresponds to a ground-glass opacity with internal lucency in the superior aspect of the LEFT lower lobe. Differential considerations include infection, sequela of pulmonary infarction, or neoplasm. Recommend follow-up CT in 3 months. This recommendation follows the consensus statement: Guidelines for Management of Incidental Pulmonary Nodules Detected on CT Images: From the Fleischner Society 2017; Radiology 2017; 284:228-243. 3. There is a 2 cm  LEFT thyroid nodule. Recommend nonemergent thyroid US (ref: J Am Coll Radiol. 2015 Feb;12(2): 143-50). Aortic Atherosclerosis (ICD10-I70.0). These results were called by telephone at the time of interpretation on 06/24/2021 at 6:06 pm  to provider [facility] , who verbally acknowledged these results. Electronically Signed   By: Valentino Saxon M.D.   On: 06/24/2021 18:11    Scheduled Meds:  sodium chloride   Intravenous Once   amoxicillin-clavulanate  1 tablet Oral Q12H   apixaban  5 mg Oral BID   Chlorhexidine Gluconate Cloth  6 each Topical Daily   cyanocobalamin  1,000 mcg Intramuscular Daily   free water  100 mL Per Tube Q4H   haloperidol lactate  0.5 mg Intravenous QHS   mirtazapine  7.5 mg Per Tube QHS   multivitamin with minerals  1 tablet Per Tube Daily   sodium bicarbonate  650 mg Per Tube BID   tamsulosin  0.4 mg Oral QPC supper   vancomycin  125 mg Per Tube Q6H   Vitamin D (Ergocalciferol)  50,000 Units Oral Q7 days   Continuous Infusions:  dextrose 50 mL/hr at 06/26/21 8366   feeding supplement (OSMOLITE 1.2 CAL) 1,000 mL (06/26/21 0901)   thiamine 500mg  in normal saline (83ml) IVPB 500 mg (06/26/21 1418)    LOS: 7 days   Kerney Elbe, DO [facility] PAGER is on AMION  If 7PM-7AM, please contact night-coverage [email or web address]

## 2021-06-26 NOTE — Anesthesia Postprocedure Evaluation (Signed)
Anesthesia Post Note  Patient: Amber [name]  Procedure(s) Performed: ECT TX  Patient location during evaluation: PACU Anesthesia Type: General Level of consciousness: awake and alert Pain management: pain level controlled Vital Signs Assessment: post-procedure vital signs reviewed and stable Respiratory status: spontaneous breathing, nonlabored ventilation, respiratory function stable and patient connected to nasal cannula oxygen Cardiovascular status: blood pressure returned to baseline and stable Postop Assessment: no apparent nausea or vomiting Anesthetic complications: no   No notable events documented.   Last Vitals:  Vitals:   06/25/21 1440 06/25/21 1510  BP: (!) 95/59 140/84  Pulse: (!) 110 (!) 105  Resp: (!) 24 (!) 22  Temp:    SpO2: 98% 100%    Last Pain:  Vitals:   06/25/21 1510  TempSrc:   PainSc: 0-No pain                 Arita Miss

## 2021-06-26 NOTE — Progress Notes (Signed)
°   06/25/21 2051 06/26/21 0459  Assess: MEWS Score  Temp 97.7 F (36.5 C) 98 F (36.7 C)  BP 115/69 134/74  Pulse Rate (!) 113 (!) 122  Resp 16 16  SpO2  --  100 %  O2 Device  --  Room Air  Assess: MEWS Score  MEWS Temp 0 0  MEWS Systolic 0 0  MEWS Pulse 2 2  MEWS RR 0 0  MEWS LOC 0 0  MEWS Score 2 2  MEWS Score Color Yellow Yellow  Assess: if the MEWS score is Yellow or Red  Were vital signs taken at a resting state?  --  Yes  Focused Assessment  --  No change from prior assessment  Does the patient meet 2 or more of the SIRS criteria?  --  No  Early Detection of Sepsis Score *See Row Information*  --  Low  MEWS guidelines implemented *See Row Information*  --  No, previously yellow, continue vital signs every 4 hours  Treat  MEWS Interventions  --  Administered scheduled meds/treatments  Pain Scale  --  0-10  Pain Score  --  0  Notify: Provider  Provider Name/Title  --  Neomia Glass, NP  Date Provider Notified  --  06/26/21  Time Provider Notified  --  0500  Notification Type  --  Page  Notification Reason  --  Other (Comment) (HR 120-130s)  Provider response  --  See new orders  Date of Provider Response  --  06/26/21  Time of Provider Response  --  0502  Assess: SIRS CRITERIA  SIRS Temperature  0 0  SIRS Pulse 1 1  SIRS Respirations  0 0  SIRS WBC 0 0  SIRS Score Sum  1 1

## 2021-06-27 ENCOUNTER — Inpatient Hospital Stay: Payer: Medicare Other

## 2021-06-27 ENCOUNTER — Inpatient Hospital Stay
Admission: AD | Admit: 2021-06-27 | Discharge: 2021-06-27 | Disposition: A | Discharge status: Home / Self Care | Payer: Medicare Other | Source: Other Acute Inpatient Hospital | Attending: Internal Medicine | Admitting: Internal Medicine

## 2021-06-27 ENCOUNTER — Other Ambulatory Visit: Payer: Self-pay | Admitting: Psychiatry

## 2021-06-27 ENCOUNTER — Inpatient Hospital Stay: Payer: Medicare Other | Admitting: Certified Registered"

## 2021-06-27 DIAGNOSIS — F323 Major depressive disorder, single episode, severe with psychotic features: Secondary | ICD-10-CM | POA: Diagnosis not present

## 2021-06-27 LAB — COMPREHENSIVE METABOLIC PANEL
ALT: 13 U/L (ref 0–44)
AST: 13 U/L — ABNORMAL LOW (ref 15–41)
Albumin: 1.7 g/dL — ABNORMAL LOW (ref 3.5–5.0)
Alkaline Phosphatase: 50 U/L (ref 38–126)
Anion gap: 4 — ABNORMAL LOW (ref 5–15)
BUN: 15 mg/dL (ref 8–23)
CO2: 24 mmol/L (ref 22–32)
Calcium: 9.2 mg/dL (ref 8.9–10.3)
Chloride: 111 mmol/L (ref 98–111)
Creatinine, Ser: 1.1 mg/dL — ABNORMAL HIGH (ref 0.44–1.00)
GFR, Estimated: 53 mL/min — ABNORMAL LOW (ref >60)
Glucose, Bld: 89 mg/dL (ref 70–99)
Potassium: 4.2 mmol/L (ref 3.5–5.1)
Sodium: 139 mmol/L (ref 135–145)
Total Bilirubin: 0.5 mg/dL (ref 0.3–1.2)
Total Protein: 4.4 g/dL — ABNORMAL LOW (ref 6.5–8.1)

## 2021-06-27 LAB — CBC WITH DIFFERENTIAL/PLATELET
Abs Immature Granulocytes: 0.04 10*3/uL (ref 0.00–0.07)
Basophils Absolute: 0 10*3/uL (ref 0.0–0.1)
Basophils Relative: 1 %
Eosinophils Absolute: 0.1 10*3/uL (ref 0.0–0.5)
Eosinophils Relative: 1 %
HCT: 26.7 % — ABNORMAL LOW (ref 36.0–46.0)
Hemoglobin: 8.4 g/dL — ABNORMAL LOW (ref 12.0–15.0)
Immature Granulocytes: 1 %
Lymphocytes Relative: 14 %
Lymphs Abs: 1.2 10*3/uL (ref 0.7–4.0)
MCH: 30 pg (ref 26.0–34.0)
MCHC: 31.5 g/dL (ref 30.0–36.0)
MCV: 95.4 fL (ref 80.0–100.0)
Monocytes Absolute: 0.6 10*3/uL (ref 0.1–1.0)
Monocytes Relative: 7 %
Neutro Abs: 6.6 10*3/uL (ref 1.7–7.7)
Neutrophils Relative %: 76 %
Platelets: 284 10*3/uL (ref 150–400)
RBC: 2.8 MIL/uL — ABNORMAL LOW (ref 3.87–5.11)
RDW: 15.4 % (ref 11.5–15.5)
WBC: 8.6 10*3/uL (ref 4.0–10.5)
nRBC: 0 % (ref 0.0–0.2)

## 2021-06-27 LAB — GLUCOSE, CAPILLARY
Glucose-Capillary: 113 mg/dL — ABNORMAL HIGH (ref 70–99)
Glucose-Capillary: 117 mg/dL — ABNORMAL HIGH (ref 70–99)
Glucose-Capillary: 81 mg/dL (ref 70–99)
Glucose-Capillary: 83 mg/dL (ref 70–99)
Glucose-Capillary: 86 mg/dL (ref 70–99)
Glucose-Capillary: 86 mg/dL (ref 70–99)
Glucose-Capillary: 87 mg/dL (ref 70–99)
Glucose-Capillary: 91 mg/dL (ref 70–99)

## 2021-06-27 LAB — LACTATE DEHYDROGENASE: LDH: 86 U/L — ABNORMAL LOW (ref 98–192)

## 2021-06-27 LAB — MAGNESIUM: Magnesium: 1.9 mg/dL (ref 1.7–2.4)

## 2021-06-27 LAB — PHOSPHORUS: Phosphorus: 3.3 mg/dL (ref 2.5–4.6)

## 2021-06-27 MED ORDER — ENSURE ENLIVE PO LIQD
237.0000 mL | Freq: Three times a day (TID) | ORAL | Status: DC
  Administered 2021-06-28: 237 mL via ORAL

## 2021-06-27 MED ORDER — MIDAZOLAM HCL 2 MG/2ML IJ SOLN
INTRAMUSCULAR | Status: AC
  Filled 2021-06-27: qty 2

## 2021-06-27 MED ORDER — SUCCINYLCHOLINE CHLORIDE 200 MG/10ML IV SOSY
PREFILLED_SYRINGE | INTRAVENOUS | Status: DC | PRN
  Administered 2021-06-27: 90 mg via INTRAVENOUS

## 2021-06-27 MED ORDER — METHOHEXITAL SODIUM 100 MG/10ML IV SOSY
PREFILLED_SYRINGE | INTRAVENOUS | Status: DC | PRN
  Administered 2021-06-27: 70 mg via INTRAVENOUS

## 2021-06-27 MED ORDER — ADULT MULTIVITAMIN W/MINERALS CH
1.0000 | ORAL_TABLET | Freq: Every day | ORAL | Status: DC
  Administered 2021-06-28 – 2021-07-17 (×13): 1 via ORAL
  Filled 2021-06-27 (×16): qty 1

## 2021-06-27 MED ORDER — MIDAZOLAM HCL 2 MG/2ML IJ SOLN
INTRAMUSCULAR | Status: DC | PRN
  Administered 2021-06-27: 2 mg via INTRAVENOUS

## 2021-06-27 MED ORDER — SODIUM CHLORIDE 0.9 % IV BOLUS
500.0000 mL | Freq: Once | INTRAVENOUS | Status: AC
  Administered 2021-06-27: 01:00:00 500 mL via INTRAVENOUS

## 2021-06-27 MED ORDER — DEXTROSE-NACL 5-0.9 % IV SOLN: INTRAVENOUS | Status: DC

## 2021-06-27 NOTE — Progress Notes (Signed)
Patient awake to name, more talkative with staff. Re-orients easily.  Per RN on floor, leave PIV in. #22g LH heplock. Purwick in place and patent.  No stool noted while in pacu. Vitals ST, afebrile Ra sats>95%

## 2021-06-27 NOTE — Progress Notes (Signed)
Telemetry monitoring called, Patient HR sustaining 130 post ECT treatment. Vital Signs are Yellow MEWS. I will follow protocol for Q2 vitals. Patient asymptomatic. No PRN medication prescribed to give. Message sent through secure chat to MD. Waiting on response. Will continue to monitor.

## 2021-06-27 NOTE — Progress Notes (Signed)
°   06/26/21 2152  Assess: MEWS Score  Temp 98.9 F (37.2 C)  BP 134/69  Pulse Rate (!) 116  Resp 18  SpO2 100 %  Assess: MEWS Score  MEWS Temp 0  MEWS Systolic 0  MEWS Pulse 2  MEWS RR 0  MEWS LOC 0  MEWS Score 2  MEWS Score Color Yellow  Assess: if the MEWS score is Yellow or Red  Were vital signs taken at a resting state? Yes  Focused Assessment No change from prior assessment  Does the patient meet 2 or more of the SIRS criteria? No  Early Detection of Sepsis Score *See Row Information* Low  MEWS guidelines implemented *See Row Information* Yes  Treat  MEWS Interventions Escalated (See documentation below)  Pain Scale 0-10  Pain Score 0  Take Vital Signs  Increase Vital Sign Frequency  Yellow: Q 2hr X 2 then Q 4hr X 2, if remains yellow, continue Q 4hrs  Escalate  MEWS: Escalate Yellow: discuss with charge nurse/RN and consider discussing with provider and RRT  Notify: Charge Nurse/RN  Name of Charge Nurse/RN Notified Phyllis  Date Charge Nurse/RN Notified 06/26/21  Time Charge Nurse/RN Notified 2155  Assess: SIRS CRITERIA  SIRS Temperature  0  SIRS Pulse 1  SIRS Respirations  0  SIRS WBC 0  SIRS Score Sum  1

## 2021-06-27 NOTE — Progress Notes (Signed)
Nutrition Follow-up  DOCUMENTATION CODES:   Severe malnutrition in context of chronic illness  INTERVENTION:   -D/c free water flushes due to no feeding access -Once diet is advanced, add:  -Ensure Enlive po TID, each supplement provides 350 kcal and 20 grams of protein.  -MVI with minerals daily   NUTRITION DIAGNOSIS:   Severe Malnutrition related to chronic illness (major depressive disorder) as evidenced by percent weight loss, severe fat depletion, severe muscle depletion.  Ongoing  GOAL:   Patient will meet greater than or equal to 90% of their needs  Progressing   MONITOR:   Diet advancement, Labs, Weight trends, Skin, I & O's  REASON FOR ASSESSMENT:   Consult Enteral/tube feeding initiation and management  ASSESSMENT:   76 yo female with a PMH of HTN, CKD stage 3b, vitamin D deficiency 2/2 CKD, secondary hyperparathyroidism admitted with hypernatremia 2/2 FTT in the setting of depression/MDD. Transfer[facility] from [facility] to [facility] for consideration of ECT.  1/31- NGT removed  Reviewed I/O's: +1.7 L x 24 hours and +6.4 L since admission  UOP: 1.8 L x 24 hours   Per psychiatry notes, pt responding well to ECT treatments. Plan for ECT today and Friday (06/29/21). Pt currently NPO for ECT.   Spoke with pt at bedside, who reports "I'm not sure who I feel". Noted pt without NGT. She has not eaten today and when asked if she is hungry she reports 'I don't know". Noted SLP cleared pt for clear liquids with NGT with plans to advance to solid foods once NGT was removed.   Medications reviewed and include vitamin B-12, remeron, vitamin D3, thiamine, dextrose 10% infusion @ 50 ml/hr.   Labs reviewed: CBGS: 81-113.   Diet Order:   Diet Order             Diet NPO time specified  Diet effective now                   EDUCATION NEEDS:   Not appropriate for education at this time  Skin:  Skin Assessment: Reviewed RN Assessment  Last BM:  06/27/21  Height:   Ht  Readings from Last 1 Encounters:  06/20/21 5\' 6"  (1.676 m)    Weight:   Wt Readings from Last 1 Encounters:  06/25/21 69.1 kg   BMI:  Body mass index is 24.59 kg/m.  Estimated Nutritional Needs:   Kcal:  9381-0175  Protein:  85-100 grams  Fluid:  >1.75 L    Loistine Chance, RD, LDN, [place] Registered Dietitian II Certified Diabetes Care and Education Specialist Please refer to [facility] for RD and/or RD on-call/weekend/after hours pager

## 2021-06-27 NOTE — Progress Notes (Signed)
°   06/27/21 1645  Assess: MEWS Score  Temp 98.4 F (36.9 C)  BP (!) 156/92  Pulse Rate (!) 129  Resp 18  SpO2 100 %  O2 Device Room Air  Assess: MEWS Score  MEWS Temp 0  MEWS Systolic 0  MEWS Pulse 2  MEWS RR 0  MEWS LOC 0  MEWS Score 2  MEWS Score Color Yellow  Assess: if the MEWS score is Yellow or Red  Were vital signs taken at a resting state? Yes  Focused Assessment No change from prior assessment  Does the patient meet 2 or more of the SIRS criteria? No  Early Detection of Sepsis Score *See Row Information* Low  MEWS guidelines implemented *See Row Information* Yes  Treat  MEWS Interventions Escalated (See documentation below)  Pain Scale 0-10  Pain Score 2  Faces Pain Scale 2  Take Vital Signs  Increase Vital Sign Frequency  Yellow: Q 2hr X 2 then Q 4hr X 2, if remains yellow, continue Q 4hrs  Escalate  MEWS: Escalate Yellow: discuss with charge nurse/RN and consider discussing with provider and RRT (Secure Message MD Billie Ruddy.)  Notify: Charge Nurse/RN  Name of Charge Nurse/RN Notified Jo  Date Charge Nurse/RN Notified 06/27/21  Time Charge Nurse/RN Notified 1700  Document  Patient Outcome  (continue to monitor)  Progress note created (see row info) Yes  Assess: SIRS CRITERIA  SIRS Temperature  0  SIRS Pulse 1  SIRS Respirations  0  SIRS WBC 0  SIRS Score Sum  1

## 2021-06-27 NOTE — Progress Notes (Signed)
Pt is out of the unit. RN to place PIV consult,when pt is back to her room.

## 2021-06-27 NOTE — Progress Notes (Signed)
SLP Cancellation Note  Patient Details Name: Amber [name] MRN: [id number] DOB: [birth year]   Cancelled treatment:       Reason Eval/Treat Not Completed:  (chart reviewed; consulted NSG/MD). Received secure chat message from [place] stating that pt has been getting ECT treatments. She pulled her DobbHoff tube out last night. She is alert and more talkative today, more so after treatment. She passed the [facility] for NSG. MD wants to see if pt could possibly get a diet. Family states she was eating soft foods at [facility] prior to transfer here. Reviewed the recommendations at the [facility] from 06/22/2021 w/ NSG/MD, "pt appears to present w/ functional oropharyngeal swallowing of thin liquids. Recommend a Clear liquids diet, Thins, while pt has NGT in place. Upgrade to solids can be considered by MD/pt once NGT is removed. Recommend general aspiration precautions, Pills CRUSHED in Puree for safer swallowing if taking orally vs NGT.". Pt was then placed on a Clear liquids diet awaiting timing for NGT removal by MD based on pt's ECT needs.  Encouraged NSG and MD to move forward w/ an oral diet based on the above; w/ general aspiration precautions. ST services can be available for further needs if any deficits noted w/ intake of oral diet. NSG is following up w/ MD.       Orinda Kenner, MS, CCC-SLP Speech Language Pathologist Rehab Services; [place] [phone] (ascom) Haillie Radu 06/27/2021, 4:24 PM

## 2021-06-27 NOTE — Anesthesia Preprocedure Evaluation (Signed)
Anesthesia Evaluation  Patient identified by MRN, date of birth, ID band Patient awake    Reviewed: Allergy & Precautions, H&P , NPO status , Patient's Chart, lab work & pertinent test results, reviewed documented beta blocker date and time   Airway Mallampati: II   Neck ROM: full    Dental  (+) Poor Dentition   Pulmonary pneumonia, resolved, former smoker,    Pulmonary exam normal        Cardiovascular Exercise Tolerance: Poor hypertension, On Medications negative cardio ROS Normal cardiovascular exam Rhythm:regular Rate:Normal     Neuro/Psych PSYCHIATRIC DISORDERS Anxiety Depression negative neurological ROS     GI/Hepatic Neg liver ROS, GERD  Medicated,  Endo/Other  Hyperthyroidism   Renal/GU Renal disease  negative genitourinary   Musculoskeletal   Abdominal   Peds  Hematology  (+) Blood dyscrasia, anemia ,   Anesthesia Other Findings Past Medical History: No date: Anxiety No date: Hypertension No past surgical history on file.   Reproductive/Obstetrics negative OB ROS                             Anesthesia Physical Anesthesia Plan  ASA: 3  Anesthesia Plan: General   Post-op Pain Management:    Induction:   PONV Risk Score and Plan:   Airway Management Planned:   Additional Equipment:   Intra-op Plan:   Post-operative Plan:   Informed Consent: I have reviewed the patients History and Physical, chart, labs and discussed the procedure including the risks, benefits and alternatives for the proposed anesthesia with the patient or authorized representative who has indicated his/her understanding and acceptance.     Dental Advisory Given  Plan Discussed with: CRNA  Anesthesia Plan Comments:         Anesthesia Quick Evaluation

## 2021-06-27 NOTE — H&P (Signed)
Amber [name] is an 76 y.o. female.   Chief Complaint: Patient continues to report feeling hopeless and being sure that nothing is going to get better.  No specific medical complaint today HPI: Severe depression with psychotic symptoms  Past Medical History:  Diagnosis Date   Anxiety    Hypertension     History reviewed. No pertinent surgical history.  Family History  Problem Relation Age of Onset   High blood pressure Maternal Grandmother    Social History:  reports that she has quit smoking. Her smoking use included cigarettes. She has never used smokeless tobacco. She reports that she does not currently use alcohol. She reports that she does not currently use drugs.  Allergies:  Allergies  Allergen Reactions   Shellfish Allergy     "I pass out"    (Not in a hospital admission)   Results for orders placed or performed during the hospital encounter of 06/19/21 (from the past 48 hour(s))  Glucose, capillary     Status: Abnormal   Collection Time: 06/25/21  9:05 PM  Result Value Ref Range   Glucose-Capillary 100 (H) 70 - 99 mg/dL    Comment: Glucose reference range applies only to samples taken after fasting for at least 8 hours.  Glucose, capillary     Status: Abnormal   Collection Time: 06/26/21  1:01 AM  Result Value Ref Range   Glucose-Capillary 116 (H) 70 - 99 mg/dL    Comment: Glucose reference range applies only to samples taken after fasting for at least 8 hours.  Glucose, capillary     Status: Abnormal   Collection Time: 06/26/21  5:00 AM  Result Value Ref Range   Glucose-Capillary 102 (H) 70 - 99 mg/dL    Comment: Glucose reference range applies only to samples taken after fasting for at least 8 hours.  Lactate dehydrogenase     Status: Abnormal   Collection Time: 06/26/21  5:47 AM  Result Value Ref Range   LDH 97 (L) 98 - 192 U/L    Comment: Performed at [facility], [address] [place], [place] [address]  CBC with Differential/Platelet      Status: Abnormal   Collection Time: 06/26/21  5:47 AM  Result Value Ref Range   WBC 10.0 4.0 - 10.5 K/uL   RBC 3.04 (L) 3.87 - 5.11 MIL/uL   Hemoglobin 9.0 (L) 12.0 - 15.0 g/dL   HCT 28.4 (L) 36.0 - 46.0 %   MCV 93.4 80.0 - 100.0 fL   MCH 29.6 26.0 - 34.0 pg   MCHC 31.7 30.0 - 36.0 g/dL   RDW 15.1 11.5 - 15.5 %   Platelets 278 150 - 400 K/uL   nRBC 0.0 0.0 - 0.2 %   Neutrophils Relative % 88 %   Neutro Abs 8.8 (H) 1.7 - 7.7 K/uL   Lymphocytes Relative 6 %   Lymphs Abs 0.6 (L) 0.7 - 4.0 K/uL   Monocytes Relative 4 %   Monocytes Absolute 0.4 0.1 - 1.0 K/uL   Eosinophils Relative 1 %   Eosinophils Absolute 0.1 0.0 - 0.5 K/uL   Basophils Relative 0 %   Basophils Absolute 0.0 0.0 - 0.1 K/uL   Immature Granulocytes 1 %   Abs Immature Granulocytes 0.08 (H) 0.00 - 0.07 K/uL    Comment: Performed at [facility], [address], [place], [place] [address]  Comprehensive metabolic panel     Status: Abnormal   Collection Time: 06/26/21  5:47 AM  Result Value Ref Range   Sodium 137 135 - 145 mmol/L   Potassium 3.9 3.5 - 5.1 mmol/L   Chloride 109 98 - 111 mmol/L   CO2 23 22 - 32 mmol/L   Glucose, Bld 95 70 - 99 mg/dL    Comment: Glucose reference range applies only to samples taken after fasting for at least 8 hours.   BUN 17 8 - 23 mg/dL   Creatinine, Ser 0.94 0.44 - 1.00 mg/dL   Calcium 9.0 8.9 - 10.3 mg/dL   Total Protein 4.6 (L) 6.5 - 8.1 g/dL   Albumin 2.0 (L) 3.5 - 5.0 g/dL   AST 13 (L) 15 - 41 U/L   ALT 17 0 - 44 U/L   Alkaline Phosphatase 55 38 - 126 U/L   Total Bilirubin 0.4 0.3 - 1.2 mg/dL   GFR, Estimated >60 >60 mL/min    Comment: (NOTE) Calculated using the CKD-EPI Creatinine Equation (2021)    Anion gap 5 5 - 15    Comment: Performed at [facility], [address] [place], [place] [address]  Magnesium     Status: None   Collection Time: 06/26/21  5:47 AM  Result Value Ref Range   Magnesium 2.0 1.7 - 2.4 mg/dL    Comment: Performed at  [facility], [address] [place], [place] [address]  Phosphorus     Status: None   Collection Time: 06/26/21  5:47 AM  Result Value Ref Range   Phosphorus 2.9 2.5 - 4.6 mg/dL    Comment: Performed at [facility], [address] [place], [place] [address]  Glucose, capillary     Status: Abnormal   Collection Time: 06/26/21  7:50 AM  Result Value Ref Range   Glucose-Capillary 119 (H) 70 - 99 mg/dL    Comment: Glucose reference range applies only to samples taken after fasting for at least 8 hours.  Glucose, capillary     Status: Abnormal   Collection Time: 06/26/21 12:40 PM  Result Value Ref Range   Glucose-Capillary 108 (H) 70 - 99 mg/dL    Comment: Glucose reference range applies only to samples taken after fasting for at least 8 hours.  Glucose, capillary     Status: Abnormal   Collection Time: 06/26/21  5:17 PM  Result Value Ref Range   Glucose-Capillary 113 (H) 70 - 99 mg/dL    Comment: Glucose reference range applies only to samples taken after fasting for at least 8 hours.  Glucose, capillary     Status: None   Collection Time: 06/27/21 12:23 AM  Result Value Ref Range   Glucose-Capillary 86 70 - 99 mg/dL    Comment: Glucose reference range applies only to samples taken after fasting for at least 8 hours.  Glucose, capillary     Status: None   Collection Time: 06/27/21  4:46 AM  Result Value Ref Range   Glucose-Capillary 87 70 - 99 mg/dL    Comment: Glucose reference range applies only to samples taken after fasting for at least 8 hours.  Lactate dehydrogenase     Status: Abnormal   Collection Time: 06/27/21  4:56 AM  Result Value Ref Range   LDH 86 (L) 98 - 192 U/L    Comment: Performed at [facility], [address] [place], [place] [address]  CBC with Differential/Platelet     Status: Abnormal   Collection Time: 06/27/21  4:56 AM  Result Value Ref Range   WBC 8.6 4.0 -  10.5 K/uL   RBC 2.80 (L) 3.87 - 5.11 MIL/uL   Hemoglobin 8.4  (L) 12.0 - 15.0 g/dL   HCT 26.7 (L) 36.0 - 46.0 %   MCV 95.4 80.0 - 100.0 fL   MCH 30.0 26.0 - 34.0 pg   MCHC 31.5 30.0 - 36.0 g/dL   RDW 15.4 11.5 - 15.5 %   Platelets 284 150 - 400 K/uL   nRBC 0.0 0.0 - 0.2 %   Neutrophils Relative % 76 %   Neutro Abs 6.6 1.7 - 7.7 K/uL   Lymphocytes Relative 14 %   Lymphs Abs 1.2 0.7 - 4.0 K/uL   Monocytes Relative 7 %   Monocytes Absolute 0.6 0.1 - 1.0 K/uL   Eosinophils Relative 1 %   Eosinophils Absolute 0.1 0.0 - 0.5 K/uL   Basophils Relative 1 %   Basophils Absolute 0.0 0.0 - 0.1 K/uL   Immature Granulocytes 1 %   Abs Immature Granulocytes 0.04 0.00 - 0.07 K/uL    Comment: Performed at [facility], [address] [place], [place] [address]  Comprehensive metabolic panel     Status: Abnormal   Collection Time: 06/27/21  4:56 AM  Result Value Ref Range   Sodium 139 135 - 145 mmol/L   Potassium 4.2 3.5 - 5.1 mmol/L   Chloride 111 98 - 111 mmol/L   CO2 24 22 - 32 mmol/L   Glucose, Bld 89 70 - 99 mg/dL    Comment: Glucose reference range applies only to samples taken after fasting for at least 8 hours.   BUN 15 8 - 23 mg/dL   Creatinine, Ser 1.10 (H) 0.44 - 1.00 mg/dL   Calcium 9.2 8.9 - 10.3 mg/dL   Total Protein 4.4 (L) 6.5 - 8.1 g/dL   Albumin 1.7 (L) 3.5 - 5.0 g/dL   AST 13 (L) 15 - 41 U/L   ALT 13 0 - 44 U/L   Alkaline Phosphatase 50 38 - 126 U/L   Total Bilirubin 0.5 0.3 - 1.2 mg/dL   GFR, Estimated 53 (L) >60 mL/min    Comment: (NOTE) Calculated using the CKD-EPI Creatinine Equation (2021)    Anion gap 4 (L) 5 - 15    Comment: Performed at [facility], [address], [place], [place] [address]  Magnesium     Status: None   Collection Time: 06/27/21  4:56 AM  Result Value Ref Range   Magnesium 1.9 1.7 - 2.4 mg/dL    Comment: Performed at [facility], [address] [place], [place] [address]  Phosphorus     Status: None   Collection Time: 06/27/21  4:56 AM  Result Value Ref Range    Phosphorus 3.3 2.5 - 4.6 mg/dL    Comment: Performed at [facility], [address] [place], [place] [address]  Glucose, capillary     Status: None   Collection Time: 06/27/21  8:11 AM  Result Value Ref Range   Glucose-Capillary 86 70 - 99 mg/dL    Comment: Glucose reference range applies only to samples taken after fasting for at least 8 hours.  Glucose, capillary     Status: None   Collection Time: 06/27/21 12:15 PM  Result Value Ref Range   Glucose-Capillary 81 70 - 99 mg/dL    Comment: Glucose reference range applies only to samples taken after fasting for at least 8 hours.  Glucose, capillary     Status: None   Collection Time: 06/27/21  4:46 PM  Result Value Ref Range   Glucose-Capillary 91 70 - 99 mg/dL    Comment: Glucose reference range applies only to samples taken after fasting for at least 8 hours.   DG Chest Port 1 View  Result Date: 06/27/2021 CLINICAL DATA:  Exam ordered for SOB EXAM: PORTABLE CHEST - 1 VIEW COMPARISON:  06/24/2021 FINDINGS: Some increase in patchy opacities at the left lung base. Blunting of the left lateral costophrenic angle suggesting effusion. Right lung clear. Heart size upper limits normal for technique. Interval removal of feeding tube. Visualized bones unremarkable. IMPRESSION: Possible small left effusion, with patchy opacities at the left lung base. Electronically Signed   By: Lucrezia Europe M.D.   On: 06/27/2021 08:09    Review of Systems  Constitutional: Negative.   HENT: Negative.    Eyes: Negative.   Respiratory: Negative.    Cardiovascular: Negative.   Gastrointestinal: Negative.   Musculoskeletal: Negative.   Skin: Negative.   Neurological: Negative.   Psychiatric/Behavioral:  Positive for dysphoric mood.    Blood pressure 119/88, pulse (!) 112, temperature 98 F (36.7 C), resp. rate 15, height 5\' 6"  (1.676 m), weight 69.1 kg, SpO2 96 %. Physical Exam Vitals and nursing note reviewed.  Constitutional:      Appearance: She  is well-developed.  HENT:     Head: Normocephalic and atraumatic.  Eyes:     Conjunctiva/sclera: Conjunctivae normal.     Pupils: Pupils are equal, round, and reactive to light.  Cardiovascular:     Heart sounds: Normal heart sounds.  Pulmonary:     Effort: Pulmonary effort is normal.  Abdominal:     Palpations: Abdomen is soft.  Musculoskeletal:        General: Normal range of motion.     Cervical back: Normal range of motion.  Skin:    General: Skin is warm and dry.  Neurological:     Mental Status: She is alert.  Psychiatric:        Mood and Affect: Mood is depressed. Affect is blunt.        Speech: Speech is delayed.        Behavior: Behavior is slowed.        Cognition and Memory: Cognition is impaired. Memory is impaired.     Assessment/Plan Patient is already seeming more alert and appropriate with one treatment which is of course a very good sign.  She did not actively resist getting ECT today.  Continue plan for her index course.  Alethia Berthold, MD 06/27/2021, 5:23 PM

## 2021-06-27 NOTE — Transfer of Care (Signed)
Immediate Anesthesia Transfer of Care Note  Patient: Amber [name]  Procedure(s) Performed: ECT TX  Patient Location: PACU  Anesthesia Type:General  Level of Consciousness: sedated  Airway & Oxygen Therapy: Patient Spontanous Breathing  Post-op Assessment: Report given to RN and Post -op Vital signs reviewed and stable  Post vital signs: Reviewed and stable  Last Vitals:  Vitals Value Taken Time  BP 119/75 06/27/21 1424  Temp    Pulse 112 06/27/21 1426  Resp 25 06/27/21 1427  SpO2 99 % 06/27/21 1426  Vitals shown include unvalidated device data.  Last Pain:  Vitals:   06/27/21 1409  TempSrc: Tympanic  PainSc: 0-No pain         Complications: No notable events documented.

## 2021-06-27 NOTE — Progress Notes (Signed)
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[facility]) Active Problems:   Moderate protein-calorie malnutrition ([place])   Primary hyperparathyroidism ([place])   Dysphagia   COVID-19 virus infection 06/08/2021   Nasogastric tube present   Chronic deep vein thrombosis (DVT) (HCC)   Chronic anticoagulation   ABLA (acute blood loss anemia)   Acute urinary retention   Foley catheter in place   Stage 3a chronic kidney disease (HCC)   At high risk for inadequate nutritional intake   Gross hematuria   Fever   Aspiration pneumonia of right lung due to gastric secretions [facility])   The patient is a 76 year old Caucasian female with a past medical history significant for but not due to CKD stage IIIa, hypertension, vitamin D deficiency, chronic DVT on Eliquis, history of psychotic depression with refusal to eat who was admitted as a transfer from [facility] for consideration of ECT by Dr. Weber Cooks of psychiatry.  She was admitted to [facility] from 06/08/21-06/19/21 after presenting with failure to thrive following a several week history of complaints of difficulty swallowing with decreased oral intake and subsequent development of confusion and hallucinations.  She had outpatient work-up include brain MRI as well as MRV which was unremarkable.  On admission she is found to have an AKI and several electrolyte abnormalities.  While at [facility] she refused to eat and refused further evaluation with esophagram to evaluate her sensation of dysphagia.  She is evaluated by psychiatry who determined her to have capacity for medical decision-making and so an NG tube was placed for force-feeding.  Palliative care consult was also done.  Due to her poor response to oral antidepressants ECG was recommended and thus arranged to transfer to [facility].  On 06/08/2020 she tested positive  for COVID and she received 3 days of remdesivir.  Most of her nutritional deficiencies related to inadequate oral intake were corrected prior to transfer and on the day of discharge she developed acute urinary tension a Foley catheter was placed and it was traumatic insertion with gross development of gross hematuria.  Hemoglobin did drop to 7.0   On the afternoon of 06/21/21 she spiked a temperature of 101.9 and became tachycardic and slightly hypotensive.  She is given IV fluid bolus and she is pancultured.  Further work-up revealed that she had a right lower lobe pneumonia so she was started on IV Unasyn.  She had some loose stools and this was tested for C. difficile however we can consider C. difficile colonization given that she no longer spiking temperatures and afebrile and has no leukocytosis.  SLP evaluated and recommending a clear liquid diet with thins in addition to an NG tube.  Given that she has a recent pneumonia and anesthesia felt it would be safer to wait a couple more days and for ECT treatment until that time to help clear her pneumonia.  She has now undergone ECT treatment Monday, 06/25/2021. Changed her antibiotics from IV Unasyn to p.o. Augmentin for her Pneunmonia and now they are to stop today. Given that she continues to have some mild loose stools I spoke with ID who feels that since the patient was positive toxin producing C. difficile empirically treat with vancomycin for 10 days.  Foley catheter has been removed.  And she has had a positive response to ECT   Psychotic depression  placing patient at high nutritional risk  Need for electroconvulsive therapy -Continue mirtazapine 7.5 mg  --ECT per psych   Inadequate oral nutritional intake  Nutritional deficiencies requiring NGT for force-feeding Severe protein-calorie malnutrition ([place]) -tube feed started due to pt's refusal to eat.  Pt pulled out Dobhoff overnight.   --Pt passed bedside swallow eval --start dys 3 diet with  Ensure --hold off re-inserting Dobhoff tube for now   Sepsis 2/2 Aspiration PNA  -Became febrile and tachycardic, likely could have aspirated with her NG tube in place -Initiate IV Unasyn x3 days and transitioned to p.o. Augmentin   Diarrhea -C. difficile was checked and she is positive for toxin producing C. difficile  -ID recommends just treating empirically with vancomycin 125 mg p.o. 4 times daily for 10 days --cont oral vanc  Suspected Lung Nodule -As Above noted to be 1.7 x 0.8 cm Left Upper Lobe Opacity -CT of the Chest with Contrast done and it showed "Nodular opacity seen on chest radiograph corresponds to a ground-glass opacity with internal lucency in the superior aspect of the LEFT lower lobe. Differential considerations include infection, sequela of pulmonary infarction, or neoplasm. Recommend follow-up CT in 3 months"   Bacteriuria and pyuria without evidence of infection -When she spiked a temperature few days ago we ordered a urinalysis which showed a hazy appearance with moderate hemoglobin, moderate leukocytes, negative nitrites, rare bacteria, 21-50 RBCs, no squamous epithelial cells, greater than 50 WBCs -Urine culture showed 70,000 colony-forming units of Pseudomonas which which was pansensitive but she is asymptomatic -Will not treat her Pseudomonas UTI as she has no symptoms    Thrombocytopenia, improved -Patient's platelet count went from 181 ->144 -> 132 -> 151 -> 180 -> 222 -> 224 and is now 278 and improved   Acute urinary retention - Foley catheter was inserted but since d/c'ed --Continue Tamsulosin   ABLA (acute blood loss anemia) 2/2 Gross hematuria secondary to traumatic Foley --s/p 1u pRBC --monitor Hgb  Chronic anticoagulation 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 - Initially was holding Eliquis and Lovenox at this time given her hemoglobin drop, Eliquis since resumed --cont Eliquis  COVID-19 virus infection 06/08/2021 - Not  symptomatic -Completed remdesivir for 3-day   Stage 3a chronic kidney disease ([place]) Primary hyperparathyroidism/vitamin D deficiency Metabolic acidosis   Severe malnutrition related to chronic illness -Evidence by percent weight loss, severe fat depletion and severe muscle depletion   DVT prophylaxis: DG:LOVFIEP Code Status: DNR  Family Communication: husband and daughter updated at bedside today Level of care: Telemetry Medical Dispo:   The patient is from: home Anticipated d/c is to: undetermined Anticipated d/c date is: undetermined Patient currently is not medically ready to d/c due to: receiving ECT   Subjective and Interval History:  Pt pulled off Dobhoff tube overnight.  Received ECT today.  Pt passed RN bedside swallow, and was willing to drink fluids.     Objective: Vitals:   06/27/21 1123 06/27/21 1124 06/27/21 1645 06/27/21 1846  BP: 131/75  (!) 156/92 138/73  Pulse: (!) 111 (!) 108 (!) 129 (!) 117  Resp: 18  18 20   Temp: 98.8 F (37.1 C)  98.4 F (36.9 C) 98.8 F (37.1 C)  TempSrc: Oral  Oral   SpO2: 92% 100% 100% 100%  Weight:      Height:        Intake/Output Summary (Last 24 hours) at 06/27/2021 1951 Last data filed at 06/27/2021 1933 Gross per 24 hour  Intake  2034.81 ml  Output 825 ml  Net 1209.81 ml   Filed Weights   06/21/21 0112 06/24/21 0500 06/25/21 0500  Weight: 72.7 kg 63.1 kg 69.1 kg    Examination:   Constitutional: NAD, alert, responsive to questions HEENT: conjunctivae and lids normal, EOMI CV: No cyanosis.   RESP: normal respiratory effort, on RA Extremities: No effusions, edema in BLE SKIN: warm, dry Neuro: II - XII grossly intact.   Psych: flat mood and affect.     Data Reviewed: I have personally reviewed following labs and imaging studies  CBC: Recent Labs  Lab 06/23/21 0633 06/24/21 0431 06/25/21 0640 06/26/21 0547 06/27/21 0456  WBC 4.0 5.3 5.2 10.0 8.6  NEUTROABS 2.4 3.9 3.4 8.8* 6.6  HGB 8.4* 8.6* 8.1*  9.0* 8.4*  HCT 26.1* 26.6* 25.6* 28.4* 26.7*  MCV 92.9 90.8 94.8 93.4 95.4  PLT 180 222 224 278 025   Basic Metabolic Panel: Recent Labs  Lab 06/22/21 0656 06/23/21 0633 06/24/21 0431 06/25/21 0640 06/26/21 0547 06/27/21 0456  NA 137 138 138 139 137 139  K 3.9 3.8 4.2 4.2 3.9 4.2  CL 109 110 112* 111 109 111  CO2 22 23 23  21* 23 24  GLUCOSE 86 118* 108* 88 95 89  BUN 22 18 17 15 17 15   CREATININE 0.96 0.90 0.96 0.87 0.94 1.10*  CALCIUM 8.9 8.8* 8.8* 9.1 9.0 9.2  MG 1.7 2.2 2.3 2.1 2.0 1.9  PHOS 2.8 2.9 3.4  --  2.9 3.3   GFR: Estimated Creatinine Clearance: 42.6 mL/min (A) (by C-G formula based on SCr of 1.1 mg/dL (H)). Liver Function Tests: Recent Labs  Lab 06/23/21 4270 06/24/21 0431 06/25/21 0640 06/26/21 0547 06/27/21 0456  AST 28 24 14* 13* 13*  ALT 27 28 21 17 13   ALKPHOS 58 59 50 55 50  BILITOT 0.4 0.4 0.4 0.4 0.5  PROT 4.5* 4.8* 4.4* 4.6* 4.4*  ALBUMIN 1.8* 2.0* 1.8* 2.0* 1.7*   No results for input(s): LIPASE, AMYLASE in the last 168 hours. No results for input(s): AMMONIA in the last 168 hours. Coagulation Profile: No results for input(s): INR, PROTIME in the last 168 hours. Cardiac Enzymes: No results for input(s): CKTOTAL, CKMB, CKMBINDEX, TROPONINI in the last 168 hours. BNP (last 3 results) No results for input(s): PROBNP in the last 8760 hours. HbA1C: No results for input(s): HGBA1C in the last 72 hours. CBG: Recent Labs  Lab 06/27/21 0446 06/27/21 0811 06/27/21 1215 06/27/21 1646 06/27/21 1946  GLUCAP 87 86 81 91 117*   Lipid Profile: No results for input(s): CHOL, HDL, LDLCALC, TRIG, CHOLHDL, LDLDIRECT in the last 72 hours. Thyroid Function Tests: No results for input(s): TSH, T4TOTAL, FREET4, T3FREE, THYROIDAB in the last 72 hours. Anemia Panel: No results for input(s): VITAMINB12, FOLATE, FERRITIN, TIBC, IRON, RETICCTPCT in the last 72 hours. Sepsis Labs: No results for input(s): PROCALCITON, LATICACIDVEN in the last 168  hours.  Recent Results (from the past 240 hour(s))  CULTURE, BLOOD (ROUTINE X 2) w Reflex to ID Panel     Status: None   Collection Time: 06/20/21  4:42 PM   Specimen: BLOOD RIGHT ARM  Result Value Ref Range Status   Specimen Description BLOOD RIGHT ARM  Final   Special Requests   Final    BOTTLES DRAWN AEROBIC AND ANAEROBIC Blood Culture adequate volume   Culture   Final    NO GROWTH 5 DAYS Performed at [facility] [address], [place], [place] [address]  Report Status 06/25/2021 FINAL  Final  CULTURE, BLOOD (ROUTINE X 2) w Reflex to ID Panel     Status: None   Collection Time: 06/20/21  4:53 PM   Specimen: BLOOD RIGHT HAND  Result Value Ref Range Status   Specimen Description BLOOD RIGHT HAND  Final   Special Requests   Final    BOTTLES DRAWN AEROBIC AND ANAEROBIC Blood Culture adequate volume   Culture   Final    NO GROWTH 5 DAYS Performed at [facility], [address] [place], [place] [address]    Report Status 06/25/2021 FINAL  Final  Urine Culture     Status: Abnormal   Collection Time: 06/21/21  7:30 AM   Specimen: Urine, Random  Result Value Ref Range Status   Specimen Description   Final    URINE, RANDOM Performed at [facility], [address], [place], [place] [address]    Special Requests   Final    NONE Performed at [facility], [place], [place] [address]    Culture 70,000 COLONIES/mL PSEUDOMONAS AERUGINOSA (A)  Final   Report Status 06/23/2021 FINAL  Final   Organism ID, Bacteria PSEUDOMONAS AERUGINOSA (A)  Final      Susceptibility   Pseudomonas aeruginosa - MIC*    CEFTAZIDIME 2 SENSITIVE Sensitive     CIPROFLOXACIN <=0.25 SENSITIVE Sensitive     GENTAMICIN <=1 SENSITIVE Sensitive     IMIPENEM 2 SENSITIVE Sensitive     PIP/TAZO <=4 SENSITIVE Sensitive     CEFEPIME 2 SENSITIVE Sensitive     * 70,000 COLONIES/mL PSEUDOMONAS AERUGINOSA  C Difficile Quick Screen w PCR reflex     Status:  Abnormal   Collection Time: 06/21/21  1:48 PM   Specimen: STOOL  Result Value Ref Range Status   C Diff antigen POSITIVE (A) NEGATIVE Final   C Diff toxin POSITIVE (A) NEGATIVE Final   C Diff interpretation Toxin producing C. difficile detected.  Final    Comment: CRITICAL RESULT CALLED TO, READ BACK BY AND VERIFIED WITH: [name] HUSKNIS 06/21/21 1455 Performed at [facility], [address], [place], [place] [address]       Radiology Studies: DG Chest [place] 1 View  Result Date: 06/27/2021 CLINICAL DATA:  Exam ordered for SOB EXAM: PORTABLE CHEST - 1 VIEW COMPARISON:  06/24/2021 FINDINGS: Some increase in patchy opacities at the left lung base. Blunting of the left lateral costophrenic angle suggesting effusion. Right lung clear. Heart size upper limits normal for technique. Interval removal of feeding tube. Visualized bones unremarkable. IMPRESSION: Possible small left effusion, with patchy opacities at the left lung base. Electronically Signed   By: Lucrezia Europe M.D.   On: 06/27/2021 08:09     Scheduled Meds:  sodium chloride   Intravenous Once   apixaban  5 mg Oral BID   Chlorhexidine Gluconate Cloth  6 each Topical Daily   cyanocobalamin  1,000 mcg Intramuscular Daily   [START ON 06/28/2021] feeding supplement  237 mL Oral TID BM   haloperidol lactate  0.5 mg Intravenous QHS   mirtazapine  7.5 mg Per Tube QHS   [START ON 06/28/2021] multivitamin with minerals  1 tablet Oral Daily   sodium bicarbonate  650 mg Per Tube BID   tamsulosin  0.4 mg Oral QPC supper   vancomycin  125 mg Per Tube Q6H   Vitamin D (Ergocalciferol)  50,000 Units Oral Q7 days   Continuous Infusions:  dextrose 50 mL/hr at  06/27/21 1933   thiamine 500mg  in normal saline (67ml) IVPB Stopped (06/27/21 1112)     LOS: 8 days     Enzo Bi, MD [facility] If 7PM-7AM, please contact night-coverage 06/27/2021, 7:51 PM

## 2021-06-28 DIAGNOSIS — F323 Major depressive disorder, single episode, severe with psychotic features: Secondary | ICD-10-CM | POA: Diagnosis not present

## 2021-06-28 LAB — GLUCOSE, CAPILLARY: Glucose-Capillary: 91 mg/dL (ref 70–99)

## 2021-06-28 MED ORDER — DIPHENHYDRAMINE HCL 50 MG/ML IJ SOLN: 50.0000 mg | Freq: Once | INTRAMUSCULAR | Status: DC

## 2021-06-28 MED ORDER — MIRTAZAPINE 15 MG PO TABS
15.0000 mg | ORAL_TABLET | Freq: Every day | ORAL | Status: DC
  Administered 2021-06-28 – 2021-07-03 (×6): 15 mg via ORAL
  Filled 2021-06-28 (×6): qty 1

## 2021-06-28 MED ORDER — HALOPERIDOL LACTATE 5 MG/ML IJ SOLN
5.0000 mg | Freq: Once | INTRAMUSCULAR | Status: AC
  Administered 2021-06-29: 5 mg via INTRAVENOUS
  Filled 2021-06-28: qty 1

## 2021-06-28 MED ORDER — SODIUM BICARBONATE 650 MG PO TABS: 650.0000 mg | ORAL_TABLET | Freq: Two times a day (BID) | ORAL | Status: DC

## 2021-06-28 MED ORDER — MIRTAZAPINE 15 MG PO TABS: 15.0000 mg | ORAL_TABLET | Freq: Every day | ORAL | Status: DC

## 2021-06-28 NOTE — Progress Notes (Signed)
SLP Cancellation Note  Patient Details Name: Amber [name] MRN: [id number] DOB: [birth year]   Cancelled treatment:       Reason Eval/Treat Not Completed: SLP screened, no needs identified, will sign off (chart reviewed; consulted NSG and MD) Met w/ pt prior to Lunch meal to obtain preferences for ordering and to give encouragement. Noted pt had been drinking Sprite; she stated she swallowed her "medicines fine w/ NSG this morning". She declined any po's while in the room.  Consulted NSG who endorsed swallowing of meds w/ No overt s/s of aspiration noted. NSG followed up w/ the Lunch meal stating pt ate 1/2 a sandwich, min soup and pudding as ordered w/ no overt s/s of aspiration noted. This desire for oral intake is improvement for pt.   No indication of need for ST services at this time.  Recommend a Regular diet w/ thin liquids for ease and appeal of ordering po's; general aspiration precautions. NSG to f/u w/ assisting ordering of meals to ensure meals/foods she wants. MD and NSG updated.      Amber Kenner, MS, CCC-SLP Speech Language Pathologist Rehab Services; [place] [phone] (ascom) Kelbie Moro 06/28/2021, 1:42 PM

## 2021-06-28 NOTE — Progress Notes (Signed)
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[facility]) Active Problems:   Moderate protein-calorie malnutrition ([place])   Primary hyperparathyroidism ([place])   Dysphagia   COVID-19 virus infection 06/08/2021   Nasogastric tube present   Chronic deep vein thrombosis (DVT) (HCC)   Chronic anticoagulation   ABLA (acute blood loss anemia)   Acute urinary retention   Foley catheter in place   Stage 3a chronic kidney disease (HCC)   At high risk for inadequate nutritional intake   Gross hematuria   Fever   Aspiration pneumonia of right lung due to gastric secretions [facility])   The patient is a 76 year old Caucasian female with a past medical history significant for but not due to CKD stage IIIa, hypertension, vitamin D deficiency, chronic DVT on Eliquis, history of psychotic depression with refusal to eat who was admitted as a transfer from [facility] for consideration of ECT by Dr. Weber Cooks of psychiatry.  She was admitted to [facility] from 06/08/21-06/19/21 after presenting with failure to thrive following a several week history of complaints of difficulty swallowing with decreased oral intake and subsequent development of confusion and hallucinations.  She had outpatient work-up include brain MRI as well as MRV which was unremarkable.  On admission she is found to have an AKI and several electrolyte abnormalities.  While at [facility] she refused to eat and refused further evaluation with esophagram to evaluate her sensation of dysphagia.  She is evaluated by psychiatry who determined her to have capacity for medical decision-making and so an NG tube was placed for force-feeding.  Palliative care consult was also done.  Due to her poor response to oral antidepressants ECG was recommended and thus arranged to transfer to [facility].  On 06/08/2020 she tested positive  for COVID and she received 3 days of remdesivir.  Most of her nutritional deficiencies related to inadequate oral intake were corrected prior to transfer and on the day of discharge she developed acute urinary tension a Foley catheter was placed and it was traumatic insertion with gross development of gross hematuria.  Hemoglobin did drop to 7.0   On the afternoon of 06/21/21 she spiked a temperature of 101.9 and became tachycardic and slightly hypotensive.  She is given IV fluid bolus and she is pancultured.  Further work-up revealed that she had a right lower lobe pneumonia so she was started on IV Unasyn.  She had some loose stools and this was tested for C. difficile however we can consider C. difficile colonization given that she no longer spiking temperatures and afebrile and has no leukocytosis.  SLP evaluated and recommending a clear liquid diet with thins in addition to an NG tube.  Given that she has a recent pneumonia and anesthesia felt it would be safer to wait a couple more days and for ECT treatment until that time to help clear her pneumonia.  She has now undergone ECT treatment Monday, 06/25/2021. Changed her antibiotics from IV Unasyn to p.o. Augmentin for her Pneunmonia and now they are to stop today. Given that she continues to have some mild loose stools I spoke with ID who feels that since the patient was positive toxin producing C. difficile empirically treat with vancomycin for 10 days.  Foley catheter has been removed.  And she has had a positive response to ECT   Psychotic depression  placing patient at high nutritional risk  Need for electroconvulsive therapy -increase Remeron to 15 mg nightly, per psych --ECT per psych, next session tomorrow Friday.   Inadequate oral nutritional intake  Nutritional deficiencies requiring NGT for force-feeding Severe protein-calorie malnutrition ([place]) -tube feed started due to pt's refusal to eat.  Pt pulled out [place] overnight 1/31 --Pt  passed bedside swallow eval, started diet, eating better now. --cont regular diet --NPO midnight prior to ECT   Sepsis 2/2 Aspiration PNA  -Became febrile and tachycardic, likely could have aspirated with her NG tube in place -Initiate IV Unasyn x3 days and transitioned to p.o. Augmentin   Diarrhea -C. difficile was checked and she is positive for toxin producing C. difficile  -ID recommends just treating empirically with vancomycin 125 mg p.o. 4 times daily for 10 days --cont oral vanc  Suspected Lung Nodule -As Above noted to be 1.7 x 0.8 cm Left Upper Lobe Opacity -CT of the Chest with Contrast done and it showed "Nodular opacity seen on chest radiograph corresponds to a ground-glass opacity with internal lucency in the superior aspect of the LEFT lower lobe. Differential considerations include infection, sequela of pulmonary infarction, or neoplasm. --Recommend follow-up CT in 3 months"   Bacteriuria and pyuria without evidence of infection -When she spiked a temperature few days ago we ordered a urinalysis which showed a hazy appearance with moderate hemoglobin, moderate leukocytes, negative nitrites, rare bacteria, 21-50 RBCs, no squamous epithelial cells, greater than 50 WBCs -Urine culture showed 70,000 colony-forming units of Pseudomonas which which was pansensitive but she is asymptomatic -Will not treat her Pseudomonas UTI as she has no symptoms    Thrombocytopenia, improved -Patient's platelet count went from 181 ->144 -> 132 -> 151 -> 180 -> 222 -> 224 and is now 278 and improved   Acute urinary retention - Foley catheter was inserted but since d/c'ed --Continue Tamsulosin   ABLA (acute blood loss anemia) 2/2 Gross hematuria secondary to traumatic Foley --s/p 1u pRBC --monitor Hgb and transfuse to keep Hgb >7  Chronic anticoagulation 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 - Initially was holding Eliquis and Lovenox at this time given her hemoglobin drop, Eliquis  since resumed --cont Eliquis  COVID-19 virus infection 06/08/2021 - Not symptomatic -Completed remdesivir for 3-day   Stage 3a chronic kidney disease ([place]) Primary hyperparathyroidism/vitamin D deficiency Metabolic acidosis, resolved --d/c Na bicarb   Severe malnutrition related to chronic illness -Evidence by percent weight loss, severe fat depletion and severe muscle depletion --regular diet with supplements   DVT prophylaxis: [id number] Code Status: DNR  Family Communication:  Level of care: Telemetry Medical Dispo:   The patient is from: home Anticipated d/c is to: undetermined Anticipated d/c date is: undetermined Patient currently is not medically ready to d/c due to: receiving ECT   Subjective and Interval History:  Pt ate much better today, ate 1/2 sandwich.     Objective: Vitals:   06/28/21 0746 06/28/21 1237 06/28/21 1300 06/28/21 1642  BP: (!) 116/58  118/68 130/80  Pulse: 85 95  (!) 106  Resp: 18 16  17   Temp: 98.3 F (36.8 C) 97.7 F (36.5 C)  98.4 F (36.9 C)  TempSrc:      SpO2: 100% 97%  100%  Weight:      Height:        Intake/Output Summary (Last 24 hours) at 06/28/2021 1922 Last data filed at 06/28/2021 1300 Gross per 24 hour  Intake 1143.36 ml  Output 950 ml  Net 193.36 ml   Filed Weights   06/24/21 0500 06/25/21 0500 06/28/21 0500  Weight: 63.1 kg 69.1 kg 69.5 kg    Examination:   Constitutional: NAD, alert, oriented to person and place HEENT: conjunctivae and lids normal, EOMI CV: No cyanosis.   RESP: normal respiratory effort, on RA Extremities: No effusions, edema in BLE SKIN: warm, dry Neuro: II - XII grossly intact.     Data Reviewed: I have personally reviewed following labs and imaging studies  CBC: Recent Labs  Lab 06/23/21 0633 06/24/21 0431 06/25/21 0640 06/26/21 0547 06/27/21 0456  WBC 4.0 5.3 5.2 10.0 8.6  NEUTROABS 2.4 3.9 3.4 8.8* 6.6  HGB 8.4* 8.6* 8.1* 9.0* 8.4*  HCT 26.1* 26.6* 25.6* 28.4* 26.7*  MCV  92.9 90.8 94.8 93.4 95.4  PLT 180 222 224 278 681   Basic Metabolic Panel: Recent Labs  Lab 06/22/21 0656 06/23/21 0633 06/24/21 0431 06/25/21 0640 06/26/21 0547 06/27/21 0456  NA 137 138 138 139 137 139  K 3.9 3.8 4.2 4.2 3.9 4.2  CL 109 110 112* 111 109 111  CO2 22 23 23  21* 23 24  GLUCOSE 86 118* 108* 88 95 89  BUN 22 18 17 15 17 15   CREATININE 0.96 0.90 0.96 0.87 0.94 1.10*  CALCIUM 8.9 8.8* 8.8* 9.1 9.0 9.2  MG 1.7 2.2 2.3 2.1 2.0 1.9  PHOS 2.8 2.9 3.4  --  2.9 3.3   GFR: Estimated Creatinine Clearance: 42.6 mL/min (A) (by C-G formula based on SCr of 1.1 mg/dL (H)). Liver Function Tests: Recent Labs  Lab 06/23/21 1572 06/24/21 0431 06/25/21 0640 06/26/21 0547 06/27/21 0456  AST 28 24 14* 13* 13*  ALT 27 28 21 17 13   ALKPHOS 58 59 50 55 50  BILITOT 0.4 0.4 0.4 0.4 0.5  PROT 4.5* 4.8* 4.4* 4.6* 4.4*  ALBUMIN 1.8* 2.0* 1.8* 2.0* 1.7*   No results for input(s): LIPASE, AMYLASE in the last 168 hours. No results for input(s): AMMONIA in the last 168 hours. Coagulation Profile: No results for input(s): INR, PROTIME in the last 168 hours. Cardiac Enzymes: No results for input(s): CKTOTAL, CKMB, CKMBINDEX, TROPONINI in the last 168 hours. BNP (last 3 results) No results for input(s): PROBNP in the last 8760 hours. HbA1C: No results for input(s): HGBA1C in the last 72 hours. CBG: Recent Labs  Lab 06/27/21 1215 06/27/21 1646 06/27/21 1946 06/27/21 2355 06/28/21 0747  GLUCAP 81 91 117* 83 91   Lipid Profile: No results for input(s): CHOL, HDL, LDLCALC, TRIG, CHOLHDL, LDLDIRECT in the last 72 hours. Thyroid Function Tests: No results for input(s): TSH, T4TOTAL, FREET4, T3FREE, THYROIDAB in the last 72 hours. Anemia Panel: No results for input(s): VITAMINB12, FOLATE, FERRITIN, TIBC, IRON, RETICCTPCT in the last 72 hours. Sepsis Labs: No results for input(s): PROCALCITON, LATICACIDVEN in the last 168 hours.  Recent Results (from the past 240 hour(s))   CULTURE, BLOOD (ROUTINE X 2) w Reflex to ID Panel     Status: None   Collection Time: 06/20/21  4:42 PM   Specimen: BLOOD RIGHT ARM  Result Value Ref Range Status   Specimen Description BLOOD RIGHT ARM  Final   Special Requests   Final    BOTTLES DRAWN AEROBIC AND ANAEROBIC Blood Culture adequate volume   Culture   Final    NO GROWTH 5 DAYS Performed at [facility], [address] [place], [place] [address]    Report Status 06/25/2021 FINAL  Final  CULTURE, BLOOD (ROUTINE X 2) w Reflex to ID Panel     Status: None   Collection Time: 06/20/21  4:53 PM   Specimen: BLOOD RIGHT HAND  Result Value Ref Range Status   Specimen Description BLOOD RIGHT HAND  Final   Special Requests   Final    BOTTLES DRAWN AEROBIC AND ANAEROBIC Blood Culture adequate volume   Culture   Final    NO GROWTH 5 DAYS Performed at [facility], [address] [place], [place] [address]    Report Status 06/25/2021 FINAL  Final  Urine Culture     Status: Abnormal   Collection Time: 06/21/21  7:30 AM   Specimen: Urine, Random  Result Value Ref Range Status   Specimen Description   Final    URINE, RANDOM Performed at [facility], [address] [place], [place] [address]    Special Requests   Final    NONE Performed at [facility], [place], [place] [address]    Culture 70,000 COLONIES/mL PSEUDOMONAS AERUGINOSA (A)  Final   Report Status 06/23/2021 FINAL  Final   Organism ID, Bacteria PSEUDOMONAS AERUGINOSA (A)  Final      Susceptibility   Pseudomonas aeruginosa - MIC*    CEFTAZIDIME 2 SENSITIVE Sensitive     CIPROFLOXACIN <=0.25 SENSITIVE Sensitive     GENTAMICIN <=1 SENSITIVE Sensitive     IMIPENEM 2 SENSITIVE Sensitive     PIP/TAZO <=4 SENSITIVE Sensitive     CEFEPIME 2 SENSITIVE Sensitive     * 70,000 COLONIES/mL PSEUDOMONAS AERUGINOSA  C Difficile Quick Screen w PCR reflex     Status: Abnormal   Collection Time: 06/21/21  1:48 PM    Specimen: STOOL  Result Value Ref Range Status   C Diff antigen POSITIVE (A) NEGATIVE Final   C Diff toxin POSITIVE (A) NEGATIVE Final   C Diff interpretation Toxin producing C. difficile detected.  Final    Comment: CRITICAL RESULT CALLED TO, READ BACK BY AND VERIFIED WITH: [name] HUSKNIS 06/21/21 1455 Performed at [facility], [address], [place], [place] [address]       Radiology Studies: DG Chest [place] 1 View  Result Date: 06/27/2021 CLINICAL DATA:  Exam ordered for SOB EXAM: PORTABLE CHEST - 1 VIEW COMPARISON:  06/24/2021 FINDINGS: Some increase in patchy opacities at the left lung base. Blunting of the left lateral costophrenic angle suggesting effusion. Right lung clear. Heart size upper limits normal for technique. Interval removal of feeding tube. Visualized bones unremarkable. IMPRESSION: Possible small left effusion, with patchy opacities at the left lung base. Electronically Signed   By: Lucrezia Europe M.D.   On: 06/27/2021 08:09     Scheduled Meds:  sodium chloride   Intravenous Once   apixaban  5 mg Oral BID   Chlorhexidine Gluconate Cloth  6 each Topical Daily   cyanocobalamin  1,000 mcg Intramuscular Daily   [START ON 06/29/2021] diphenhydrAMINE  50 mg Intravenous Once   feeding supplement  237 mL Oral TID BM   haloperidol lactate  0.5 mg Intravenous QHS   [START ON 06/29/2021] haloperidol lactate  5 mg Intravenous Once   mirtazapine  15 mg Per Tube QHS   multivitamin with minerals  1 tablet Oral Daily   sodium bicarbonate  650 mg Per Tube BID   tamsulosin  0.4 mg Oral QPC supper   vancomycin  125 mg Per Tube Q6H   Vitamin D (Ergocalciferol)  50,000 Units Oral Q7  days   Continuous Infusions:  dextrose 50 mL/hr at 06/28/21 8403   thiamine 500mg  in normal saline (31ml) IVPB 500 mg (06/28/21 0925)     LOS: 9 days     Enzo Bi, MD [facility] If 7PM-7AM, please contact night-coverage 06/28/2021, 7:22 PM

## 2021-06-28 NOTE — Progress Notes (Signed)
[facility] MD Progress Note  06/28/2021 4:47 PM Amber [name]  MRN:  [id number] Subjective: Follow-up for this 76 year old woman who was admitted to our hospital for treatment of depression.  Patient seen and chart reviewed.  Spoke with speech therapy today who reported to me that the patient had eaten half of the lunch that she ordered on her own initiative.  Patient confirmed this.  An NG tube has been taken out.  Patient is much more energetic await and alert.  She is a little grumpy today.  Tells me that she does not want to have ECT however she was unable to identify the purpose of the treatment and unable to identify any alternative plan for dealing with her depression and unable to discuss the treatment plan in any rational way instead just dismissing the whole thing. 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[place]) Active Problems:   Moderate protein-calorie malnutrition ([place])   Primary hyperparathyroidism ([place])   Dysphagia   COVID-19 virus infection 06/08/2021   Nasogastric tube present   Chronic deep vein thrombosis (DVT) (HCC)   Chronic anticoagulation   ABLA (acute blood loss anemia)   Acute urinary retention   Foley catheter in place   Stage 3a chronic kidney disease (HCC)   At high risk for inadequate nutritional intake   Gross hematuria   Fever   Aspiration pneumonia of right lung due to gastric secretions (HCC)  Total Time spent with patient: 30 minutes  Past Psychiatric History: Past history of depression  Past Medical History:  Past Medical History:  Diagnosis Date   Anxiety    Hypertension    History reviewed. No pertinent surgical history. Family History:  Family History  Problem Relation Age of Onset   High blood pressure Maternal Grandmother    Family Psychiatric  History: See previous Social History:  Social History   Substance and Sexual Activity  Alcohol Use Not Currently     Social History    Substance and Sexual Activity  Drug Use Not Currently    Social History   Socioeconomic History   Marital status: Married    Spouse name: Not on file   Number of children: Not on file   Years of education: Not on file   Highest education level: Not on file  Occupational History   Not on file  Tobacco Use   Smoking status: Former    Types: Cigarettes   Smokeless tobacco: Never  Vaping Use   Vaping Use: Never used  Substance and Sexual Activity   Alcohol use: Not Currently   Drug use: Not Currently   Sexual activity: Not Currently  Other Topics Concern   Not on file  Social History Narrative   Right handed    one story home    lives with family    Doesn't work currently   Social Determinants of Radio broadcast assistant Strain: Not on file  Food Insecurity: Not on file  Transportation Needs: Not on file  Physical Activity: Not on file  Stress: Not on file  Social Connections: Not on file   Additional Social History:                         Sleep: Fair  Appetite:  Fair  Current Medications: Current Facility-Administered Medications  Medication Dose Route Frequency Provider Last Rate Last Admin   0.9 %  sodium chloride infusion (Manually program via Guardrails IV Fluids)  Intravenous Once Athena Masse, MD       acetaminophen (TYLENOL) tablet 650 mg  650 mg Oral Q6H PRN Athena Masse, MD   650 mg at 06/27/21 1608   Or   acetaminophen (TYLENOL) suppository 650 mg  650 mg Rectal Q6H PRN Athena Masse, MD       apixaban Arne [place]) tablet 5 mg  5 mg Oral BID Sheikh, Omair Crete, DO   5 mg at 06/28/21 6948   Chlorhexidine Gluconate Cloth 2 % PADS 6 each  6 each Topical Daily Raiford Noble [place], [place]   6 each at 06/28/21 5462   cyanocobalamin ((VITAMIN B-12)) injection 1,000 mcg  1,000 mcg Intramuscular Daily Judd Gaudier V, MD   1,000 mcg at 06/28/21 0926   dextrose 10 % infusion   Intravenous Continuous Foust, Katy L, NP 50 mL/hr at 06/28/21 7035  Infusion Verify at 06/28/21 0337   [START ON 06/29/2021] diphenhydrAMINE (BENADRYL) injection 50 mg  50 mg Intravenous Once Zaynab Chipman, Madie Reno, MD       feeding supplement (ENSURE ENLIVE / ENSURE PLUS) liquid 237 mL  237 mL Oral TID BM Enzo Bi, MD   237 mL at 06/28/21 0093   haloperidol lactate (HALDOL) injection 0.5 mg  0.5 mg Intravenous QHS Judd Gaudier V, MD   0.5 mg at 06/27/21 2156   [START ON 06/29/2021] haloperidol lactate (HALDOL) injection 5 mg  5 mg Intravenous Once Hatice Bubel, Madie Reno, MD       mirtazapine (REMERON) tablet 15 mg  15 mg Per Tube QHS Ly Wass, Madie Reno, MD       multivitamin with minerals tablet 1 tablet  1 tablet Oral Daily Enzo Bi, MD   1 tablet at 06/28/21 0926   ondansetron (ZOFRAN) tablet 4 mg  4 mg Oral Q6H PRN Athena Masse, MD       Or   ondansetron [facility]) injection 4 mg  4 mg Intravenous Q6H PRN Athena Masse, MD       sodium bicarbonate tablet 650 mg  650 mg Per Tube BID Judd Gaudier V, MD   650 mg at 06/28/21 0926   tamsulosin (FLOMAX) capsule 0.4 mg  0.4 mg Oral QPC supper Athena Masse, MD   0.4 mg at 06/27/21 1814   thiamine 500mg  in normal saline (73ml) IVPB  500 mg Intravenous BID Judd Gaudier V, MD 100 mL/hr at 06/28/21 0925 500 mg at 06/28/21 0925   vancomycin (VANCOCIN) 50 mg/mL oral solution SOLN 125 mg  125 mg Per Tube Q6H Foust, Katy L, NP   125 mg at 06/28/21 1239   Vitamin D (Ergocalciferol) (DRISDOL) capsule 50,000 Units  50,000 Units Oral Q7 days Athena Masse, MD        Lab Results:  Results for orders placed or performed during the hospital encounter of 06/19/21 (from the past 48 hour(s))  Glucose, capillary     Status: Abnormal   Collection Time: 06/26/21  5:17 PM  Result Value Ref Range   Glucose-Capillary 113 (H) 70 - 99 mg/dL    Comment: Glucose reference range applies only to samples taken after fasting for at least 8 hours.  Glucose, capillary     Status: None   Collection Time: 06/27/21 12:23 AM  Result Value Ref Range    Glucose-Capillary 86 70 - 99 mg/dL    Comment: Glucose reference range applies only to samples taken after fasting for at least 8 hours.  Glucose, capillary  Status: None   Collection Time: 06/27/21  4:46 AM  Result Value Ref Range   Glucose-Capillary 87 70 - 99 mg/dL    Comment: Glucose reference range applies only to samples taken after fasting for at least 8 hours.  Lactate dehydrogenase     Status: Abnormal   Collection Time: 06/27/21  4:56 AM  Result Value Ref Range   LDH 86 (L) 98 - 192 U/L    Comment: Performed at [facility], [address] [place], [place] [address]  CBC with Differential/Platelet     Status: Abnormal   Collection Time: 06/27/21  4:56 AM  Result Value Ref Range   WBC 8.6 4.0 - 10.5 K/uL   RBC 2.80 (L) 3.87 - 5.11 MIL/uL   Hemoglobin 8.4 (L) 12.0 - 15.0 g/dL   HCT 26.7 (L) 36.0 - 46.0 %   MCV 95.4 80.0 - 100.0 fL   MCH 30.0 26.0 - 34.0 pg   MCHC 31.5 30.0 - 36.0 g/dL   RDW 15.4 11.5 - 15.5 %   Platelets 284 150 - 400 K/uL   nRBC 0.0 0.0 - 0.2 %   Neutrophils Relative % 76 %   Neutro Abs 6.6 1.7 - 7.7 K/uL   Lymphocytes Relative 14 %   Lymphs Abs 1.2 0.7 - 4.0 K/uL   Monocytes Relative 7 %   Monocytes Absolute 0.6 0.1 - 1.0 K/uL   Eosinophils Relative 1 %   Eosinophils Absolute 0.1 0.0 - 0.5 K/uL   Basophils Relative 1 %   Basophils Absolute 0.0 0.0 - 0.1 K/uL   Immature Granulocytes 1 %   Abs Immature Granulocytes 0.04 0.00 - 0.07 K/uL    Comment: Performed at [facility], [address] [place], [place] [address]  Comprehensive metabolic panel     Status: Abnormal   Collection Time: 06/27/21  4:56 AM  Result Value Ref Range   Sodium 139 135 - 145 mmol/L   Potassium 4.2 3.5 - 5.1 mmol/L   Chloride 111 98 - 111 mmol/L   CO2 24 22 - 32 mmol/L   Glucose, Bld 89 70 - 99 mg/dL    Comment: Glucose reference range applies only to samples taken after fasting for at least 8 hours.   BUN 15 8 - 23 mg/dL   Creatinine, Ser 1.10  (H) 0.44 - 1.00 mg/dL   Calcium 9.2 8.9 - 10.3 mg/dL   Total Protein 4.4 (L) 6.5 - 8.1 g/dL   Albumin 1.7 (L) 3.5 - 5.0 g/dL   AST 13 (L) 15 - 41 U/L   ALT 13 0 - 44 U/L   Alkaline Phosphatase 50 38 - 126 U/L   Total Bilirubin 0.5 0.3 - 1.2 mg/dL   GFR, Estimated 53 (L) >60 mL/min    Comment: (NOTE) Calculated using the CKD-EPI Creatinine Equation (2021)    Anion gap 4 (L) 5 - 15    Comment: Performed at [facility], [address] [place], [place] [address]  Magnesium     Status: None   Collection Time: 06/27/21  4:56 AM  Result Value Ref Range   Magnesium 1.9 1.7 - 2.4 mg/dL    Comment: Performed at [facility], [address] [place], [place] [address]  Phosphorus     Status: None   Collection Time: 06/27/21  4:56 AM  Result Value Ref Range   Phosphorus 3.3 2.5 - 4.6 mg/dL    Comment: Performed at [facility], [place],  [place], [place] [address]  Glucose, capillary     Status: None   Collection Time: 06/27/21  8:11 AM  Result Value Ref Range   Glucose-Capillary 86 70 - 99 mg/dL    Comment: Glucose reference range applies only to samples taken after fasting for at least 8 hours.  Glucose, capillary     Status: None   Collection Time: 06/27/21 12:15 PM  Result Value Ref Range   Glucose-Capillary 81 70 - 99 mg/dL    Comment: Glucose reference range applies only to samples taken after fasting for at least 8 hours.  Glucose, capillary     Status: None   Collection Time: 06/27/21  4:46 PM  Result Value Ref Range   Glucose-Capillary 91 70 - 99 mg/dL    Comment: Glucose reference range applies only to samples taken after fasting for at least 8 hours.  Glucose, capillary     Status: Abnormal   Collection Time: 06/27/21  7:46 PM  Result Value Ref Range   Glucose-Capillary 117 (H) 70 - 99 mg/dL    Comment: Glucose reference range applies only to samples taken after fasting for at least 8 hours.  Glucose, capillary     Status: None    Collection Time: 06/27/21 11:55 PM  Result Value Ref Range   Glucose-Capillary 83 70 - 99 mg/dL    Comment: Glucose reference range applies only to samples taken after fasting for at least 8 hours.  Glucose, capillary     Status: None   Collection Time: 06/28/21  7:47 AM  Result Value Ref Range   Glucose-Capillary 91 70 - 99 mg/dL    Comment: Glucose reference range applies only to samples taken after fasting for at least 8 hours.    Blood Alcohol level:  No results found for: [facility]  Metabolic Disorder Labs: No results found for: HGBA1C, MPG No results found for: PROLACTIN No results found for: CHOL, TRIG, HDL, CHOLHDL, VLDL, LDLCALC  Physical Findings: AIMS:  , ,  ,  ,    CIWA:    COWS:     Musculoskeletal: Strength & Muscle Tone: decreased Gait & Station: unable to stand Patient leans: N/A  Psychiatric Specialty Exam:  Presentation  General Appearance: -- (in hospital bed, had to pull covers down to examine)  Eye Contact:Minimal  Speech:Clear and Coherent  Speech Volume:Decreased  Handedness:Right   Mood and Affect  Mood:-- ("I don't want to talk")  Affect:Congruent   Thought Process  Thought Processes:-- (poverty)  Descriptions of Associations:Intact  Orientation:Full (Time, Place and Person)  Thought Content:-- (devoid of SI, HI, moderate improvement in delusions)  History of Schizophrenia/Schizoaffective disorder:No  Duration of Psychotic Symptoms:Less than six months  Hallucinations:No data recorded Ideas of Reference:None  Suicidal Thoughts:No data recorded Homicidal Thoughts:No data recorded  Sensorium  Memory:Immediate Fair; Recent Poor; Remote Poor  Judgment:Poor  Insight:Lacking   Executive Functions  Concentration:Poor  Attention Span:Poor  [place] of Knowledge:Poor  Language:Poor   Psychomotor Activity  Psychomotor Activity:No data recorded  Assets  Assets:Social Support   Sleep  Sleep:No data  recorded   Physical Exam: Physical Exam Vitals and nursing note reviewed.  Constitutional:      Appearance: She is ill-appearing.  HENT:     Head: Normocephalic and atraumatic.     Mouth/Throat:     Pharynx: Oropharynx is clear.  Eyes:     Pupils: Pupils are equal, round, and reactive to light.  Cardiovascular:     Rate and Rhythm: Normal rate and  regular rhythm.  Pulmonary:     Effort: Pulmonary effort is normal.     Breath sounds: Normal breath sounds.  Abdominal:     General: Abdomen is flat.     Palpations: Abdomen is soft.  Musculoskeletal:        General: Normal range of motion.  Skin:    General: Skin is warm and dry.  Neurological:     General: No focal deficit present.     Mental Status: She is alert. Mental status is at baseline.  Psychiatric:        Attention and Perception: She is inattentive.        Mood and Affect: Mood normal. Affect is blunt.        Speech: Speech normal.        Behavior: Behavior is withdrawn.        Thought Content: Thought content normal.        Cognition and Memory: Cognition is impaired. Memory is impaired.        Judgment: Judgment is inappropriate.   Review of Systems  Constitutional:  Positive for malaise/fatigue and weight loss.  HENT: Negative.    Eyes: Negative.   Respiratory: Negative.    Cardiovascular: Negative.   Gastrointestinal: Negative.   Musculoskeletal: Negative.   Skin: Negative.   Neurological:  Positive for weakness.  Psychiatric/Behavioral:  Positive for depression. Negative for suicidal ideas.   Blood pressure 130/80, pulse (!) 106, temperature 98.4 F (36.9 C), resp. rate 17, height 5\' 6"  (1.676 m), weight 69.5 kg, SpO2 100 %. Body mass index is 24.73 kg/m.   Treatment Plan Summary: Medication management and Plan patient is showing some clear improvement in terms of energy and motivation and appetite.  Unfortunately as I kind of suspected she might she is getting grumpy and trying to refuse treatment.   Patient has NO CAPACITY as assessed by psychiatric examination to make a decision to refuse ECT.  There is no evidence that she understands her illness or her treatment plan at all.  I have offered lots of encouragement and praise to her.  I am going to increase the dose of antidepressant mirtazapine to 15 mg getting up closer to a antidepressant dose.  I have put in n.p.o. orders for tonight and accompanied those with an order for IV haloperidol and Benadryl to be given about 11:00 tomorrow morning.  Alethia Berthold, MD 06/28/2021, 4:47 PM

## 2021-06-28 NOTE — Progress Notes (Signed)
I just got off the phone with patient's daughter who was asking about her eating. In the course of conversation she stated, "She can't refuse food." Patient's daughter educated on patient's right to refuse, to which  she replied "Not if she doesn't want [place] to put a tube back down her."  Daughter made aware we would continue to encourage adequate nutrition and hydration, but could not force it upon her.

## 2021-06-28 NOTE — Progress Notes (Signed)
Patient noted to be alert and oriented to everything except time, although she was able to state the year. Patient quite talkative today. When asked about what she would like to eat for dinner, patient stated she "normally doesn't eat much" but agreeable to nurse ordering something.  When asked about how she felt her ECT treatments were going, patient stated, "Frankly I think they are worthless, but I wasn't really given a choice." Patient stated she would like to "be left alone and be allowed to live her life." Patient further elaborated that she felt like "decisions were being made for her." Patient acknowledged if she did not have adequate hydration or nutrition it would be detrimental to her health and ultimately could lead to death. Patient endorsed feeling like she had "lived a good life" and wanted to "just spend time at home with her grandchildren."  MD made aware.

## 2021-06-29 ENCOUNTER — Ambulatory Visit: Payer: Self-pay | Admitting: Internal Medicine

## 2021-06-29 ENCOUNTER — Inpatient Hospital Stay (HOSPITAL_COMMUNITY)
Admission: AD | Admit: 2021-06-29 | Discharge: 2021-06-29 | Disposition: A | Discharge status: Home / Self Care | Payer: Medicare Other | Source: Other Acute Inpatient Hospital | Attending: Internal Medicine | Admitting: Internal Medicine

## 2021-06-29 ENCOUNTER — Inpatient Hospital Stay: Payer: Medicare Other | Admitting: Certified Registered Nurse Anesthetist

## 2021-06-29 ENCOUNTER — Other Ambulatory Visit: Payer: Self-pay | Admitting: Psychiatry

## 2021-06-29 DIAGNOSIS — F332 Major depressive disorder, recurrent severe without psychotic features: Secondary | ICD-10-CM

## 2021-06-29 DIAGNOSIS — F323 Major depressive disorder, single episode, severe with psychotic features: Secondary | ICD-10-CM | POA: Diagnosis not present

## 2021-06-29 LAB — CBC
HCT: 24.8 % — ABNORMAL LOW (ref 36.0–46.0)
Hemoglobin: 7.9 g/dL — ABNORMAL LOW (ref 12.0–15.0)
MCH: 30.4 pg (ref 26.0–34.0)
MCHC: 31.9 g/dL (ref 30.0–36.0)
MCV: 95.4 fL (ref 80.0–100.0)
Platelets: 333 10*3/uL (ref 150–400)
RBC: 2.6 MIL/uL — ABNORMAL LOW (ref 3.87–5.11)
RDW: 15.5 % (ref 11.5–15.5)
WBC: 6.2 10*3/uL (ref 4.0–10.5)
nRBC: 0 % (ref 0.0–0.2)

## 2021-06-29 LAB — GLUCOSE, CAPILLARY
Glucose-Capillary: 90 mg/dL (ref 70–99)
Glucose-Capillary: 88 mg/dL (ref 70–99)
Glucose-Capillary: 115 mg/dL — ABNORMAL HIGH (ref 70–99)
Glucose-Capillary: 81 mg/dL (ref 70–99)
Glucose-Capillary: 96 mg/dL (ref 70–99)
Glucose-Capillary: 84 mg/dL (ref 70–99)
Glucose-Capillary: 92 mg/dL (ref 70–99)

## 2021-06-29 LAB — BASIC METABOLIC PANEL
Anion gap: 3 — ABNORMAL LOW (ref 5–15)
BUN: 11 mg/dL (ref 8–23)
CO2: 22 mmol/L (ref 22–32)
Calcium: 9.3 mg/dL (ref 8.9–10.3)
Chloride: 114 mmol/L — ABNORMAL HIGH (ref 98–111)
Creatinine, Ser: 1.18 mg/dL — ABNORMAL HIGH (ref 0.44–1.00)
GFR, Estimated: 49 mL/min — ABNORMAL LOW (ref >60)
Glucose, Bld: 78 mg/dL (ref 70–99)
Potassium: 3.6 mmol/L (ref 3.5–5.1)
Sodium: 139 mmol/L (ref 135–145)

## 2021-06-29 LAB — PHOSPHORUS: Phosphorus: 3 mg/dL (ref 2.5–4.6)

## 2021-06-29 LAB — MAGNESIUM: Magnesium: 1.9 mg/dL (ref 1.7–2.4)

## 2021-06-29 MED ORDER — THIAMINE HCL 100 MG PO TABS
100.0000 mg | ORAL_TABLET | Freq: Every day | ORAL | Status: DC
  Administered 2021-06-30 – 2021-07-17 (×14): 100 mg via ORAL
  Filled 2021-06-29 (×15): qty 1

## 2021-06-29 MED ORDER — GLYCOPYRROLATE 0.2 MG/ML IJ SOLN: 0.1000 mg | Freq: Once | INTRAMUSCULAR | Status: DC

## 2021-06-29 MED ORDER — ONDANSETRON HCL 4 MG/2ML IJ SOLN
INTRAMUSCULAR | Status: DC | PRN
  Administered 2021-06-29: 4 mg via INTRAVENOUS

## 2021-06-29 MED ORDER — MIDAZOLAM HCL 2 MG/2ML IJ SOLN: 2.0000 mg | Freq: Once | INTRAMUSCULAR | Status: DC

## 2021-06-29 MED ORDER — SODIUM CHLORIDE 0.9 % IV SOLN: 500.0000 mL | Freq: Once | INTRAVENOUS | Status: DC

## 2021-06-29 MED ORDER — SODIUM CHLORIDE 0.9 % IV SOLN: INTRAVENOUS | Status: DC | PRN

## 2021-06-29 MED ORDER — ONDANSETRON HCL 4 MG/2ML IJ SOLN
INTRAMUSCULAR | Status: AC
  Filled 2021-06-29: qty 2

## 2021-06-29 MED ORDER — PROSOURCE PLUS PO LIQD
30.0000 mL | Freq: Three times a day (TID) | ORAL | Status: DC
  Administered 2021-06-30 – 2021-07-09 (×11): 30 mL via ORAL
  Filled 2021-06-29 (×33): qty 30

## 2021-06-29 MED ORDER — ONDANSETRON HCL 4 MG/2ML IJ SOLN: 4.0000 mg | Freq: Once | INTRAMUSCULAR | Status: DC | PRN

## 2021-06-29 MED ORDER — METHOHEXITAL SODIUM 100 MG/10ML IV SOSY
PREFILLED_SYRINGE | INTRAVENOUS | Status: DC | PRN
  Administered 2021-06-29: 70 mg via INTRAVENOUS

## 2021-06-29 MED ORDER — MIDAZOLAM HCL 2 MG/2ML IJ SOLN
INTRAMUSCULAR | Status: AC
  Filled 2021-06-29: qty 2

## 2021-06-29 MED ORDER — MIDAZOLAM HCL 2 MG/2ML IJ SOLN
INTRAMUSCULAR | Status: DC | PRN
  Administered 2021-06-29: 2 mg via INTRAVENOUS

## 2021-06-29 MED ORDER — FENTANYL CITRATE (PF) 100 MCG/2ML IJ SOLN: 25.0000 ug | INTRAMUSCULAR | Status: DC | PRN

## 2021-06-29 MED ORDER — SUCCINYLCHOLINE CHLORIDE 200 MG/10ML IV SOSY
PREFILLED_SYRINGE | INTRAVENOUS | Status: DC | PRN
  Administered 2021-06-29: 90 mg via INTRAVENOUS

## 2021-06-29 NOTE — Progress Notes (Signed)
Patient returned from ECT in stable condition.

## 2021-06-29 NOTE — Progress Notes (Signed)
Patient continues to voice concerns regarding continued ECT treatment. Patient is adamant she does not want to continue ECT treatment. Patient also voices she does not "feel like anyone has explained anything to her." Patient continues to endorse feeling like "decisions are being made for (her) and about (her), not with (her)."  Patient requesting breakfast tray. Explained to patient she was no allowed to eat or drink anything prior to her ECT treatment today. Patient expresses frustration and exclaims, "I thought the point was they wanted me to eat! Now I can't?!"  Patient further elaborates her normal pattern of eating is one "meal" daily and then snacks. She states she has eaten this way "for years."  Patient encouraged to discuss this with MD and psychiatrist.

## 2021-06-29 NOTE — H&P (Signed)
Amber [name] is an 76 y.o. female.   Chief Complaint: Patient continues to complain of fatigue and is highly negativistic about everything hopeless about her situation HPI: History of severe depression  Past Medical History:  Diagnosis Date   Anxiety    Hypertension     History reviewed. No pertinent surgical history.  Family History  Problem Relation Age of Onset   High blood pressure Maternal Grandmother    Social History:  reports that she has quit smoking. Her smoking use included cigarettes. She has never used smokeless tobacco. She reports that she does not currently use alcohol. She reports that she does not currently use drugs.  Allergies:  Allergies  Allergen Reactions   Shellfish Allergy     "I pass out"    (Not in a hospital admission)   Results for orders placed or performed during the hospital encounter of 06/19/21 (from the past 48 hour(s))  Glucose, capillary     Status: None   Collection Time: 06/27/21  4:46 PM  Result Value Ref Range   Glucose-Capillary 91 70 - 99 mg/dL    Comment: Glucose reference range applies only to samples taken after fasting for at least 8 hours.  Glucose, capillary     Status: Abnormal   Collection Time: 06/27/21  7:46 PM  Result Value Ref Range   Glucose-Capillary 117 (H) 70 - 99 mg/dL    Comment: Glucose reference range applies only to samples taken after fasting for at least 8 hours.  Glucose, capillary     Status: None   Collection Time: 06/27/21 11:55 PM  Result Value Ref Range   Glucose-Capillary 83 70 - 99 mg/dL    Comment: Glucose reference range applies only to samples taken after fasting for at least 8 hours.  Glucose, capillary     Status: None   Collection Time: 06/28/21  7:47 AM  Result Value Ref Range   Glucose-Capillary 91 70 - 99 mg/dL    Comment: Glucose reference range applies only to samples taken after fasting for at least 8 hours.  Glucose, capillary     Status: None   Collection Time: 06/28/21  4:43 PM   Result Value Ref Range   Glucose-Capillary 90 70 - 99 mg/dL    Comment: Glucose reference range applies only to samples taken after fasting for at least 8 hours.  Glucose, capillary     Status: None   Collection Time: 06/28/21  7:48 PM  Result Value Ref Range   Glucose-Capillary 88 70 - 99 mg/dL    Comment: Glucose reference range applies only to samples taken after fasting for at least 8 hours.  Glucose, capillary     Status: Abnormal   Collection Time: 06/29/21  1:14 AM  Result Value Ref Range   Glucose-Capillary 115 (H) 70 - 99 mg/dL    Comment: Glucose reference range applies only to samples taken after fasting for at least 8 hours.  Basic metabolic panel     Status: Abnormal   Collection Time: 06/29/21  4:39 AM  Result Value Ref Range   Sodium 139 135 - 145 mmol/L   Potassium 3.6 3.5 - 5.1 mmol/L   Chloride 114 (H) 98 - 111 mmol/L   CO2 22 22 - 32 mmol/L   Glucose, Bld 78 70 - 99 mg/dL    Comment: Glucose reference range applies only to samples taken after fasting for at least 8 hours.   BUN 11 8 - 23 mg/dL   Creatinine, Ser  1.18 (H) 0.44 - 1.00 mg/dL   Calcium 9.3 8.9 - 10.3 mg/dL   GFR, Estimated 49 (L) >60 mL/min    Comment: (NOTE) Calculated using the CKD-EPI Creatinine Equation (2021)    Anion gap 3 (L) 5 - 15    Comment: Performed at [facility], [address] [place], [place] [address]  CBC     Status: Abnormal   Collection Time: 06/29/21  4:39 AM  Result Value Ref Range   WBC 6.2 4.0 - 10.5 K/uL   RBC 2.60 (L) 3.87 - 5.11 MIL/uL   Hemoglobin 7.9 (L) 12.0 - 15.0 g/dL   HCT 24.8 (L) 36.0 - 46.0 %   MCV 95.4 80.0 - 100.0 fL   MCH 30.4 26.0 - 34.0 pg   MCHC 31.9 30.0 - 36.0 g/dL   RDW 15.5 11.5 - 15.5 %   Platelets 333 150 - 400 K/uL   nRBC 0.0 0.0 - 0.2 %    Comment: Performed at [facility], [address], [place], [place] [address]  Magnesium     Status: None   Collection Time: 06/29/21  4:39 AM  Result Value Ref Range   Magnesium  1.9 1.7 - 2.4 mg/dL    Comment: Performed at [facility], [address], [place], [place] [address]  Phosphorus     Status: None   Collection Time: 06/29/21  4:39 AM  Result Value Ref Range   Phosphorus 3.0 2.5 - 4.6 mg/dL    Comment: Performed at [facility], [address] [place], [place] [address]  Glucose, capillary     Status: None   Collection Time: 06/29/21  5:28 AM  Result Value Ref Range   Glucose-Capillary 81 70 - 99 mg/dL    Comment: Glucose reference range applies only to samples taken after fasting for at least 8 hours.  Glucose, capillary     Status: None   Collection Time: 06/29/21  8:14 AM  Result Value Ref Range   Glucose-Capillary 96 70 - 99 mg/dL    Comment: Glucose reference range applies only to samples taken after fasting for at least 8 hours.  Glucose, capillary     Status: None   Collection Time: 06/29/21 11:44 AM  Result Value Ref Range   Glucose-Capillary 84 70 - 99 mg/dL    Comment: Glucose reference range applies only to samples taken after fasting for at least 8 hours.  Glucose, capillary     Status: None   Collection Time: 06/29/21  3:46 PM  Result Value Ref Range   Glucose-Capillary 92 70 - 99 mg/dL    Comment: Glucose reference range applies only to samples taken after fasting for at least 8 hours.   No results found.  Review of Systems  Constitutional:  Positive for fatigue.  HENT: Negative.    Eyes: Negative.   Respiratory: Negative.    Cardiovascular: Negative.   Gastrointestinal: Negative.   Musculoskeletal: Negative.   Skin: Negative.   Neurological:  Positive for weakness.  Psychiatric/Behavioral:  Positive for dysphoric mood. The patient is nervous/anxious.    Blood pressure 136/68, pulse (!) 119, temperature (!) 97 F (36.1 C), resp. rate (!) 27, height 5\' 6"  (1.676 m), SpO2 100 %. Physical Exam Vitals and nursing note reviewed.  Constitutional:      Appearance: She is well-developed. She is ill-appearing.   HENT:     Head: Normocephalic and atraumatic.  Eyes:     Conjunctiva/sclera: Conjunctivae normal.  Pupils: Pupils are equal, round, and reactive to light.  Cardiovascular:     Heart sounds: Normal heart sounds.  Pulmonary:     Effort: Pulmonary effort is normal.  Abdominal:     Palpations: Abdomen is soft.  Musculoskeletal:        General: Normal range of motion.     Cervical back: Normal range of motion.  Skin:    General: Skin is warm and dry.  Neurological:     General: No focal deficit present.     Mental Status: She is alert.  Psychiatric:        Attention and Perception: She is inattentive.        Mood and Affect: Mood is depressed. Affect is blunt.        Speech: Speech is delayed.        Behavior: Behavior is slowed.        Thought Content: Thought content is delusional.        Cognition and Memory: Cognition is impaired. Memory is impaired.     Assessment/Plan Today will be treatment #3.  She appears to be a little more alert and energetic and is eating better.  Recommend continuing on with index course of treatment  Alethia Berthold, MD 06/29/2021, 4:41 PM

## 2021-06-29 NOTE — Transfer of Care (Signed)
Immediate Anesthesia Transfer of Care Note  Patient: Amber [name]  Procedure(s) Performed: ECT TX  Patient Location: PACU  Anesthesia Type:General  Level of Consciousness: drowsy  Airway & Oxygen Therapy: Patient Spontanous Breathing and Patient connected to face mask oxygen  Post-op Assessment: Report given to RN and Post -op Vital signs reviewed and stable  Post vital signs: Reviewed and stable  Last Vitals:  Vitals Value Taken Time  BP 135/77 06/29/21 1402  Temp    Pulse 109 06/29/21 1407  Resp 30 06/29/21 1407  SpO2 100 % 06/29/21 1407  Vitals shown include unvalidated device data.  Last Pain:  Vitals:   06/29/21 1337  TempSrc:   PainSc: 0-No pain         Complications: No notable events documented.

## 2021-06-29 NOTE — Anesthesia Postprocedure Evaluation (Signed)
Anesthesia Post Note  Patient: Amber [name]  Procedure(s) Performed: ECT TX  Patient location during evaluation: PACU Anesthesia Type: General Level of consciousness: sedated Pain management: satisfactory to patient Vital Signs Assessment: post-procedure vital signs reviewed and stable Respiratory status: spontaneous breathing and nonlabored ventilation Cardiovascular status: stable Anesthetic complications: no   No notable events documented.   Last Vitals:  Vitals:   06/29/21 1415 06/29/21 1425  BP: 133/74 136/68  Pulse: (!) 105 (!) 119  Resp: (!) 28 (!) 27  Temp:  (!) 36.1 C  SpO2: 100% 100%    Last Pain:  Vitals:   06/29/21 1425  TempSrc:   PainSc: 0-No pain                 VAN STAVEREN,Christyann Manolis

## 2021-06-29 NOTE — Progress Notes (Signed)
Nutrition Follow-up  DOCUMENTATION CODES:   Severe malnutrition in context of chronic illness  INTERVENTION:   -D/c Ensure Enlive -MVI with minerals daily -30 ml Prosource Plus TID, each supplement provides 100 kcals and 15 grams protein -Hormel Shake TID with meals, each supplement provides 520 kcals and 22 grams protein -Feeding assistance with meals  NUTRITION DIAGNOSIS:   Severe Malnutrition related to chronic illness (major depressive disorder) as evidenced by percent weight loss, severe fat depletion, severe muscle depletion.  Ongoing  GOAL:   Patient will meet greater than or equal to 90% of their needs  Unmet  MONITOR:   PO intake, Supplement acceptance, Labs, Weight trends, Skin, I & O's  REASON FOR ASSESSMENT:   Consult Enteral/tube feeding initiation and management  ASSESSMENT:   76 yo female with a PMH of HTN, CKD stage 3b, vitamin D deficiency 2/2 CKD, secondary hyperparathyroidism admitted with hypernatremia 2/2 FTT in the setting of depression/MDD. Transferred from [facility] to [facility] for consideration of ECT.  1/31- NGT removed 2/1- advanced to regular diet  Reviewed I/O's: -460 ml x 24 hours and +6.1 L since admission  UOP: 550 ml x 24 hours  Pt down for ECT treatment and is currently NPO for procedure.   Per chart review, pt with minimal intake. Noted meal completions 0-25%. Pt with minimal acceptance of Ensure supplements.   Medications reviewed and include vitamin D and thiamine.    Labs reviewed: CBGS: 81-115 (inpatient orders for glycemic control are none).    Diet Order:   Diet Order             Diet NPO time specified  Diet effective midnight                   EDUCATION NEEDS:   Not appropriate for education at this time  Skin:  Skin Assessment: Reviewed RN Assessment  Last BM:  06/28/21  Height:   Ht Readings from Last 1 Encounters:  06/20/21 5\' 6"  (1.676 m)    Weight:   Wt Readings from Last 1 Encounters:  06/29/21  57.4 kg   BMI:  Body mass index is 20.42 kg/m.  Estimated Nutritional Needs:   Kcal:  3976-7341  Protein:  85-100 grams  Fluid:  >1.75 L    Loistine Chance, RD, LDN, [place] Registered Dietitian II Certified Diabetes Care and Education Specialist Please refer to [facility] for RD and/or RD on-call/weekend/after hours pager

## 2021-06-29 NOTE — Anesthesia Procedure Notes (Signed)
Date/Time: 06/29/2021 1:32 PM Performed by: Demetrius Charity, CRNA Pre-anesthesia Checklist: Patient identified, Patient being monitored, Timeout performed, Emergency Drugs available and Suction available Patient Re-evaluated:Patient Re-evaluated prior to induction Oxygen Delivery Method: Circle system utilized Preoxygenation: Pre-oxygenation with 100% oxygen Ventilation: Mask ventilation without difficulty Airway Equipment and Method: Bite block Dental Injury: Teeth and Oropharynx as per pre-operative assessment

## 2021-06-29 NOTE — Procedures (Signed)
ECT SERVICES Physicians Interval Evaluation & Treatment Note  Patient Identification: Amber [name] MRN:  [id number] Date of Evaluation:  06/27/2021 TX #: 2  MADRS:   MMSE:   P.E. Findings:  Feeding tube still in.  Patient very down and blunted  Psychiatric Interval Note:  Very depressed and negativistic  Subjective:  Patient is a 76 y.o. female seen for evaluation for Electroconvulsive Therapy. Withdrawn and negative and depressed  Treatment Summary:   []   Right Unilateral             [x]  Bilateral   % Energy : 1.0 ms 40%   Impedance: 2150 ohms  Seizure Energy Index: No reading  Postictal Suppression Index: No reading  Seizure Concordance Index: No reading  Medications  Pre Shock: Robinul 0.1 mg Brevital 70 mg succinylcholine 90 mg labetalol 15 mg  Post Shock: Versed 2 mg  Seizure Duration: 49 seconds EMG 61 seconds EEG   Comments: Tolerated well.  Next treatment on Friday  Lungs:  [x]   Clear to auscultation               []  Other:   Heart:    [x]   Regular rhythm             []  irregular rhythm    [x]   Previous H&P reviewed, patient examined and there are NO CHANGES                 []   Previous H&P reviewed, patient examined and there are changes noted.   Alethia Berthold, MD 2/1/20234:45 PM

## 2021-06-29 NOTE — Progress Notes (Signed)
Blood glucose did not transfer overnight. BG at 0019 was 81 BG at 0530 115. Both collected and reported by NT.

## 2021-06-29 NOTE — Progress Notes (Signed)
Patient to ECT in stable condition

## 2021-06-29 NOTE — Procedures (Signed)
ECT SERVICES Physicians Interval Evaluation & Treatment Note  Patient Identification: Amber [name] MRN:  [id number] Date of Evaluation:  06/29/2021 TX #: 3  MADRS:   MMSE:   P.E. Findings:  No change to physical exam except that she now has a feeding tube out.  Seems a little more awake and energetic  Psychiatric Interval Note:  Continues to be highly negative  Subjective:  Patient is a 76 y.o. female seen for evaluation for Electroconvulsive Therapy. Negative hopeless down  Treatment Summary:   []   Right Unilateral             [x]  Bilateral   % Energy : 1.0 ms 40%   Impedance: 2340 ohms  Seizure Energy Index: 4690 V squared  Postictal Suppression Index: Less than 10%  Seizure Concordance Index: 54%  Medications  Pre Shock: Robinul 0.1 mg Brevital 70 mg succinylcholine 90 mg labetalol 20 mg  Post Shock: Versed 2 mg  Seizure Duration: 37 seconds EMG 44 seconds EEG   Comments: Next treatment Monday  Lungs:  [x]   Clear to auscultation               []  Other:   Heart:    [x]   Regular rhythm             []  irregular rhythm    [x]   Previous H&P reviewed, patient examined and there are NO CHANGES                 []   Previous H&P reviewed, patient examined and there are changes noted.   Alethia Berthold, MD 2/3/20234:43 PM

## 2021-06-29 NOTE — Progress Notes (Signed)
Patient anxious/agitated following ECT treatment. Patient heart rate noted to be running between 110s-130s. Patient c/o headache and nausea. MD notified. Patient given prn haldol and prn zofran.

## 2021-06-29 NOTE — Care Management Important Message (Signed)
Important Message  Patient Details  Name: Amber [name] MRN: [id number] Date of Birth: 09/16/1945   Medicare Important Message Given:  Other (see comment)  Patient is in an isolation room so I called to review her Important Message from Medicare with her by phone [phone]) but there was no answer.   Amber [name] 06/29/2021, 1:50 PM

## 2021-06-29 NOTE — Progress Notes (Signed)
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[facility]) Active Problems:   Moderate protein-calorie malnutrition ([place])   Primary hyperparathyroidism ([place])   Dysphagia   COVID-19 virus infection 06/08/2021   Nasogastric tube present   Chronic deep vein thrombosis (DVT) (HCC)   Chronic anticoagulation   ABLA (acute blood loss anemia)   Acute urinary retention   Foley catheter in place   Stage 3a chronic kidney disease (HCC)   At high risk for inadequate nutritional intake   Gross hematuria   Fever   Aspiration pneumonia of right lung due to gastric secretions [facility])   The patient is a 76 year old Caucasian female with a past medical history significant for but not due to CKD stage IIIa, hypertension, vitamin D deficiency, chronic DVT on Eliquis, history of psychotic depression with refusal to eat who was admitted as a transfer from [facility] for consideration of ECT by Dr. Weber Cooks of psychiatry.  She was admitted to [facility] from 06/08/21-06/19/21 after presenting with failure to thrive following a several week history of complaints of difficulty swallowing with decreased oral intake and subsequent development of confusion and hallucinations.  She had outpatient work-up include brain MRI as well as MRV which was unremarkable.  On admission she is found to have an AKI and several electrolyte abnormalities.  While at [facility] she refused to eat and refused further evaluation with esophagram to evaluate her sensation of dysphagia.  She is evaluated by psychiatry who determined her to have capacity for medical decision-making and so an NG tube was placed for force-feeding.  Palliative care consult was also done.  Due to her poor response to oral antidepressants ECG was recommended and thus arranged to transfer to [facility].  On 06/08/2020 she tested positive  for COVID and she received 3 days of remdesivir.  Most of her nutritional deficiencies related to inadequate oral intake were corrected prior to transfer and on the day of discharge she developed acute urinary tension a Foley catheter was placed and it was traumatic insertion with gross development of gross hematuria.  Hemoglobin did drop to 7.0   On the afternoon of 06/21/21 she spiked a temperature of 101.9 and became tachycardic and slightly hypotensive.  She is given IV fluid bolus and she is pancultured.  Further work-up revealed that she had a right lower lobe pneumonia so she was started on IV Unasyn.  She had some loose stools and this was tested for C. difficile however we can consider C. difficile colonization given that she no longer spiking temperatures and afebrile and has no leukocytosis.  SLP evaluated and recommending a clear liquid diet with thins in addition to an NG tube.  Given that she has a recent pneumonia and anesthesia felt it would be safer to wait a couple more days and for ECT treatment until that time to help clear her pneumonia.  She has now undergone ECT treatment Monday, 06/25/2021. Changed her antibiotics from IV Unasyn to p.o. Augmentin for her Pneunmonia and now they are to stop today. Given that she continues to have some mild loose stools I spoke with ID who feels that since the patient was positive toxin producing C. difficile empirically treat with vancomycin for 10 days.  Foley catheter has been removed.  And she has had a positive response to ECT   Psychotic depression  placing patient at high nutritional risk  Need for electroconvulsive therapy --cont Remeron to 15 mg nightly, per psych --ECT per psych, session today   Inadequate oral nutritional intake  Nutritional deficiencies requiring NGT for force-feeding Severe protein-calorie malnutrition ([place]) -tube feed started due to pt's refusal to eat.  Pt pulled out [place] overnight 1/31 --Pt passed bedside swallow  eval, started diet, eating better now. --cont regular diet --NPO midnight prior to ECT   Sepsis 2/2 Aspiration PNA  -Became febrile and tachycardic, likely could have aspirated with her NG tube in place -Initiate IV Unasyn x3 days and transitioned to p.o. Augmentin, completed 7-day course.  Diarrhea -C. difficile was checked and she is positive for toxin producing C. difficile  -ID recommends just treating empirically with vancomycin 125 mg p.o. 4 times daily for 10 days --cont oral vanc  Suspected Lung Nodule -As Above noted to be 1.7 x 0.8 cm Left Upper Lobe Opacity -CT of the Chest with Contrast done and it showed "Nodular opacity seen on chest radiograph corresponds to a ground-glass opacity with internal lucency in the superior aspect of the LEFT lower lobe. Differential considerations include infection, sequela of pulmonary infarction, or neoplasm. --Recommend follow-up CT in 3 months"   Bacteriuria and pyuria without evidence of infection -When she spiked a temperature few days ago we ordered a urinalysis which showed a hazy appearance with moderate hemoglobin, moderate leukocytes, negative nitrites, rare bacteria, 21-50 RBCs, no squamous epithelial cells, greater than 50 WBCs -Urine culture showed 70,000 colony-forming units of Pseudomonas which which was pansensitive but she is asymptomatic -Will not treat her Pseudomonas UTI as she has no symptoms    Thrombocytopenia, improved -Patient's platelet count went from 181 ->144 -> 132 -> 151 -> 180 -> 222 -> 224 and is now 278 and improved   Acute urinary retention - Foley catheter was inserted but since d/c'ed --Continue Tamsulosin   ABLA (acute blood loss anemia) 2/2 Gross hematuria secondary to traumatic Foley --s/p 1u pRBC --monitor Hgb and transfuse to keep Hgb >7  Chronic anticoagulation 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 - Initially was holding Eliquis and Lovenox at this time given her hemoglobin drop, Eliquis  since resumed --cont Eliquis  COVID-19 virus infection 06/08/2021 - Not symptomatic -Completed remdesivir for 3-day   Stage 3a chronic kidney disease ([place]) Primary hyperparathyroidism/vitamin D deficiency Metabolic acidosis, resolved   Severe malnutrition related to chronic illness -Evidence by percent weight loss, severe fat depletion and severe muscle depletion --regular diet with supplements   DVT prophylaxis: [id number] Code Status: DNR  Family Communication:  Level of care: Telemetry Medical Dispo:   The patient is from: home Anticipated d/c is to: undetermined Anticipated d/c date is: undetermined Patient currently is not medically ready to d/c due to: receiving ECT   Subjective and Interval History:  Psych Dr. Weber Cooks determined yesterday that pt still doesn't have decision-making capacity.  ECT today.    Objective: Vitals:   06/29/21 0527 06/29/21 0530 06/29/21 0815 06/29/21 1145  BP: 108/63  (!) 156/88 134/88  Pulse: (!) 103 (!) 101 99 (!) 103  Resp: 18  16 16   Temp: 99.3 F (37.4 C)  98.1 F (36.7 C) 99 F (37.2 C)  TempSrc: Oral  Oral Oral  SpO2:  98% 100% 100%  Weight:  57.4 kg    Height:        Intake/Output Summary (Last 24 hours) at 06/29/2021 1544 Last data filed at 06/29/2021 1350 Gross per 24  hour  Intake 100 ml  Output --  Net 100 ml   Filed Weights   06/25/21 0500 06/28/21 0500 06/29/21 0530  Weight: 69.1 kg 69.5 kg 57.4 kg    Examination:   Constitutional: NAD, alert, oriented to person and place HEENT: conjunctivae and lids normal, EOMI CV: No cyanosis.   RESP: normal respiratory effort, on RA Neuro: II - XII grossly intact.     Data Reviewed: I have personally reviewed following labs and imaging studies  CBC: Recent Labs  Lab 06/23/21 0633 06/24/21 0431 06/25/21 0640 06/26/21 0547 06/27/21 0456 06/29/21 0439  WBC 4.0 5.3 5.2 10.0 8.6 6.2  NEUTROABS 2.4 3.9 3.4 8.8* 6.6  --   HGB 8.4* 8.6* 8.1* 9.0* 8.4* 7.9*  HCT  26.1* 26.6* 25.6* 28.4* 26.7* 24.8*  MCV 92.9 90.8 94.8 93.4 95.4 95.4  PLT 180 222 224 278 284 284   Basic Metabolic Panel: Recent Labs  Lab 06/23/21 0633 06/24/21 0431 06/25/21 0640 06/26/21 0547 06/27/21 0456 06/29/21 0439  NA 138 138 139 137 139 139  K 3.8 4.2 4.2 3.9 4.2 3.6  CL 110 112* 111 109 111 114*  CO2 23 23 21* 23 24 22   GLUCOSE 118* 108* 88 95 89 78  BUN 18 17 15 17 15 11   CREATININE 0.90 0.96 0.87 0.94 1.10* 1.18*  CALCIUM 8.8* 8.8* 9.1 9.0 9.2 9.3  MG 2.2 2.3 2.1 2.0 1.9 1.9  PHOS 2.9 3.4  --  2.9 3.3 3.0   GFR: Estimated Creatinine Clearance: 38.5 mL/min (A) (by C-G formula based on SCr of 1.18 mg/dL (H)). Liver Function Tests: Recent Labs  Lab 06/23/21 1324 06/24/21 0431 06/25/21 0640 06/26/21 0547 06/27/21 0456  AST 28 24 14* 13* 13*  ALT 27 28 21 17 13   ALKPHOS 58 59 50 55 50  BILITOT 0.4 0.4 0.4 0.4 0.5  PROT 4.5* 4.8* 4.4* 4.6* 4.4*  ALBUMIN 1.8* 2.0* 1.8* 2.0* 1.7*   No results for input(s): LIPASE, AMYLASE in the last 168 hours. No results for input(s): AMMONIA in the last 168 hours. Coagulation Profile: No results for input(s): INR, PROTIME in the last 168 hours. Cardiac Enzymes: No results for input(s): CKTOTAL, CKMB, CKMBINDEX, TROPONINI in the last 168 hours. BNP (last 3 results) No results for input(s): PROBNP in the last 8760 hours. HbA1C: No results for input(s): HGBA1C in the last 72 hours. CBG: Recent Labs  Lab 06/28/21 1948 06/29/21 0114 06/29/21 0528 06/29/21 0814 06/29/21 1144  GLUCAP 88 115* 81 96 84   Lipid Profile: No results for input(s): CHOL, HDL, LDLCALC, TRIG, CHOLHDL, LDLDIRECT in the last 72 hours. Thyroid Function Tests: No results for input(s): TSH, T4TOTAL, FREET4, T3FREE, THYROIDAB in the last 72 hours. Anemia Panel: No results for input(s): VITAMINB12, FOLATE, FERRITIN, TIBC, IRON, RETICCTPCT in the last 72 hours. Sepsis Labs: No results for input(s): PROCALCITON, LATICACIDVEN in the last 168  hours.  Recent Results (from the past 240 hour(s))  CULTURE, BLOOD (ROUTINE X 2) w Reflex to ID Panel     Status: None   Collection Time: 06/20/21  4:42 PM   Specimen: BLOOD RIGHT ARM  Result Value Ref Range Status   Specimen Description BLOOD RIGHT ARM  Final   Special Requests   Final    BOTTLES DRAWN AEROBIC AND ANAEROBIC Blood Culture adequate volume   Culture   Final    NO GROWTH 5 DAYS Performed at [facility], [address], [place],  [address]  Report Status 06/25/2021 FINAL  Final  CULTURE, BLOOD (ROUTINE X 2) w Reflex to ID Panel     Status: None   Collection Time: 06/20/21  4:53 PM   Specimen: BLOOD RIGHT HAND  Result Value Ref Range Status   Specimen Description BLOOD RIGHT HAND  Final   Special Requests   Final    BOTTLES DRAWN AEROBIC AND ANAEROBIC Blood Culture adequate volume   Culture   Final    NO GROWTH 5 DAYS Performed at [facility], [address] [place], [place] [address]    Report Status 06/25/2021 FINAL  Final  Urine Culture     Status: Abnormal   Collection Time: 06/21/21  7:30 AM   Specimen: Urine, Random  Result Value Ref Range Status   Specimen Description   Final    URINE, RANDOM Performed at [facility], [address], [place], [place] [address]    Special Requests   Final    NONE Performed at [facility], [place], [place] [address]    Culture 70,000 COLONIES/mL PSEUDOMONAS AERUGINOSA (A)  Final   Report Status 06/23/2021 FINAL  Final   Organism ID, Bacteria PSEUDOMONAS AERUGINOSA (A)  Final      Susceptibility   Pseudomonas aeruginosa - MIC*    CEFTAZIDIME 2 SENSITIVE Sensitive     CIPROFLOXACIN <=0.25 SENSITIVE Sensitive     GENTAMICIN <=1 SENSITIVE Sensitive     IMIPENEM 2 SENSITIVE Sensitive     PIP/TAZO <=4 SENSITIVE Sensitive     CEFEPIME 2 SENSITIVE Sensitive     * 70,000 COLONIES/mL PSEUDOMONAS AERUGINOSA  C Difficile Quick Screen w PCR reflex     Status:  Abnormal   Collection Time: 06/21/21  1:48 PM   Specimen: STOOL  Result Value Ref Range Status   C Diff antigen POSITIVE (A) NEGATIVE Final   C Diff toxin POSITIVE (A) NEGATIVE Final   C Diff interpretation Toxin producing C. difficile detected.  Final    Comment: CRITICAL RESULT CALLED TO, READ BACK BY AND VERIFIED WITH: [name] HUSKNIS 06/21/21 1455 Performed at [facility], [address], [place],  [address]       Radiology Studies: No results found.   Scheduled Meds:  [START ON 06/30/2021] (feeding supplement) PROSource Plus  30 mL Oral TID BM   sodium chloride   Intravenous Once   apixaban  5 mg Oral BID   Chlorhexidine Gluconate Cloth  6 each Topical Daily   cyanocobalamin  1,000 mcg Intramuscular Daily   diphenhydrAMINE  50 mg Intravenous Once   haloperidol lactate  0.5 mg Intravenous QHS   haloperidol lactate  5 mg Intravenous Once   mirtazapine  15 mg Oral QHS   multivitamin with minerals  1 tablet Oral Daily   tamsulosin  0.4 mg Oral QPC supper   [START ON 06/30/2021] thiamine  100 mg Oral Daily   vancomycin  125 mg Per Tube Q6H   Vitamin D (Ergocalciferol)  50,000 Units Oral Q7 days   Continuous Infusions:     LOS: 10 days     Enzo Bi, MD [facility] If 7PM-7AM, please contact night-coverage 06/29/2021, 3:44 PM

## 2021-06-29 NOTE — Anesthesia Preprocedure Evaluation (Signed)
Anesthesia Evaluation    Airway Mallampati: III     Mouth opening: Limited Mouth Opening  Dental  (+) Chipped   Pulmonary asthma , former smoker,     + decreased breath sounds      Cardiovascular Exercise Tolerance: Poor hypertension, Pt. on medications  Rate:Tachycardia     Neuro/Psych Anxiety Depression    GI/Hepatic Neg liver ROS, GERD  ,  Endo/Other  Hyperthyroidism   Renal/GU      Musculoskeletal negative musculoskeletal ROS (+)   Abdominal Normal abdominal exam  (+)   Peds negative pediatric ROS (+)  Hematology  (+) Blood dyscrasia, anemia ,   Anesthesia Other Findings   Reproductive/Obstetrics negative OB ROS                             Anesthesia Physical Anesthesia Plan  ASA: 3  Anesthesia Plan: General   Post-op Pain Management:    Induction: Intravenous  PONV Risk Score and Plan:   Airway Management Planned: Natural Airway and Mask  Additional Equipment:   Intra-op Plan:   Post-operative Plan:   Informed Consent: I have reviewed the patients History and Physical, chart, labs and discussed the procedure including the risks, benefits and alternatives for the proposed anesthesia with the patient or authorized representative who has indicated his/her understanding and acceptance.       Plan Discussed with: CRNA and Surgeon  Anesthesia Plan Comments:         Anesthesia Quick Evaluation

## 2021-06-29 NOTE — Consult Note (Signed)
ECT consult: Follow-up on this 76 year old woman with severe depression with psychotic features.  Patient had electroconvulsive therapy this afternoon.  This was her third treatment.  Treatment was of an affective type and she had no complications noted.  Return to her room as expected.  Patient should be encouraged to eat as much as she wishes and as much as she can all weekend.  May recommend starting a little bit of physical therapy.  She is starting to show a lot more energy and talkativeness.  Follow up next treatment Monday.

## 2021-06-30 DIAGNOSIS — F323 Major depressive disorder, single episode, severe with psychotic features: Secondary | ICD-10-CM | POA: Diagnosis not present

## 2021-06-30 MED ORDER — ARTIFICIAL TEARS OPHTHALMIC OINT
TOPICAL_OINTMENT | OPHTHALMIC | Status: DC | PRN
  Filled 2021-06-30: qty 3.5

## 2021-06-30 NOTE — Progress Notes (Signed)
OT Cancellation Note  Patient Details Name: Amber [name] MRN: [id number] DOB: 1946-02-29   Cancelled Treatment:    Reason Eval/Treat Not Completed: Patient declined, no reason specified Adamantly refuses all aspects of participation with OT today. Despite all/best efforts to encourage, motivate, and educate, pt is completely unwilling to even give minimal effort. When asked if OT can check on her another day, she again declines. When asked what her goals are, she states "I don't know". OT will sign off at this time. Please re-consult should pt become agreeable to/appropriate for therapy services. Thank you.  Gerrianne Scale, [place], OTR/L ascom [phone] 06/30/21, 12:55 PM

## 2021-06-30 NOTE — Progress Notes (Signed)
PT Cancellation Note  Patient Details Name: BERTHA LOKKEN MRN: [id number] DOB: 01/31/1946   Cancelled Treatment:    Reason Eval/Treat Not Completed: Patient declined, no reason specified: Pt adamantly declined to participate in any way with PT services. Pt education and encouragement given with pt again adamantly refusing. Asked pt if PT could attempt to work with her on another day with pt refusing, nursing notified.  Will complete PT orders at this time but will reassess pt pending a change in status upon receipt of new PT orders.    Linus Salmons PT, DPT 06/30/21, 12:27 PM

## 2021-06-30 NOTE — Progress Notes (Signed)
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[facility]) Active Problems:   Moderate protein-calorie malnutrition ([place])   Primary hyperparathyroidism ([place])   Dysphagia   COVID-19 virus infection 06/08/2021   Nasogastric tube present   Chronic deep vein thrombosis (DVT) (HCC)   Chronic anticoagulation   ABLA (acute blood loss anemia)   Acute urinary retention   Foley catheter in place   Stage 3a chronic kidney disease (HCC)   At high risk for inadequate nutritional intake   Gross hematuria   Fever   Aspiration pneumonia of right lung due to gastric secretions [facility])   The patient is a 76 year old Caucasian female with a past medical history significant for but not due to CKD stage IIIa, hypertension, vitamin D deficiency, chronic DVT on Eliquis, history of psychotic depression with refusal to eat who was admitted as a transfer from [facility] for consideration of ECT by Dr. Weber [name] of psychiatry.  She was admitted to [facility] from 06/08/21-06/19/21 after presenting with failure to thrive following a several week history of complaints of difficulty swallowing with decreased oral intake and subsequent development of confusion and hallucinations.  She had outpatient work-up include brain MRI as well as MRV which was unremarkable.  On admission she is found to have an AKI and several electrolyte abnormalities.  While at [facility] she refused to eat and refused further evaluation with esophagram to evaluate her sensation of dysphagia.  She is evaluated by psychiatry who determined her to have capacity for medical decision-making and so an NG tube was placed for force-feeding.  Palliative care consult was also done.  Due to her poor response to oral antidepressants ECG was recommended and thus arranged to transfer to [facility].  On 06/08/2020 she tested positive  for COVID and she received 3 days of remdesivir.  Most of her nutritional deficiencies related to inadequate oral intake were corrected prior to transfer and on the day of discharge she developed acute urinary tension a Foley catheter was placed and it was traumatic insertion with gross development of gross hematuria.  Hemoglobin did drop to 7.0   On the afternoon of 06/21/21 she spiked a temperature of 101.9 and became tachycardic and slightly hypotensive.  She is given IV fluid bolus and she is pancultured.  Further work-up revealed that she had a right lower lobe pneumonia so she was started on IV Unasyn.  She had some loose stools and this was tested for C. difficile however we can consider C. difficile colonization given that she no longer spiking temperatures and afebrile and has no leukocytosis.  SLP evaluated and recommending a clear liquid diet with thins in addition to an NG tube.  Given that she has a recent pneumonia and anesthesia felt it would be safer to wait a couple more days and for ECT treatment until that time to help clear her pneumonia.  She has now undergone ECT treatment Monday, 06/25/2021. Changed her antibiotics from IV Unasyn to p.o. Augmentin for her Pneunmonia and now they are to stop today. Given that she continues to have some mild loose stools I spoke with ID who feels that since the patient was positive toxin producing C. difficile empirically treat with vancomycin for 10 days.  Foley catheter has been removed.  And she has had a positive response to ECT   Psychotic depression  placing patient at high nutritional risk  Need for electroconvulsive therapy --cont Remeron to 15 mg nightly, per psych --ECT per psych, next session Monday   Inadequate oral nutritional intake  Nutritional deficiencies requiring NGT for force-feeding Severe protein-calorie malnutrition ([place]) -tube feed started due to pt's refusal to eat.  Pt pulled out Dobhoff overnight 1/31 --Pt passed bedside  swallow eval, started diet, eating better now. --cont regular diet --NPO midnight prior to ECT   Sepsis 2/2 Aspiration PNA  -Became febrile and tachycardic, likely could have aspirated with her NG tube in place -Initiate IV Unasyn x3 days and transitioned to p.o. Augmentin, completed 7-day course.  Diarrhea -C. difficile was checked and she is positive for toxin producing C. difficile  -ID recommends just treating empirically with vancomycin 125 mg p.o. 4 times daily for 10 days --cont oral vanc  Suspected Lung Nodule -As Above noted to be 1.7 x 0.8 cm Left Upper Lobe Opacity -CT of the Chest with Contrast done and it showed "Nodular opacity seen on chest radiograph corresponds to a ground-glass opacity with internal lucency in the superior aspect of the LEFT lower lobe. Differential considerations include infection, sequela of pulmonary infarction, or neoplasm. --Recommend follow-up CT in 3 months   Bacteriuria and pyuria without evidence of infection -When she spiked a temperature few days ago we ordered a urinalysis which showed a hazy appearance with moderate hemoglobin, moderate leukocytes, negative nitrites, rare bacteria, 21-50 RBCs, no squamous epithelial cells, greater than 50 WBCs -Urine culture showed 70,000 colony-forming units of Pseudomonas which which was pansensitive but she is asymptomatic -Will not treat her Pseudomonas UTI as she has no symptoms    Thrombocytopenia, improved -Patient's platelet count went from 181 ->144 -> 132 -> 151 -> 180 -> 222 -> 224 and is now 278 and improved   Acute urinary retention, resolved - Foley catheter was inserted but since d/c'ed --Continue Tamsulosin   ABLA (acute blood loss anemia) 2/2 Gross hematuria secondary to traumatic Foley --s/p 1u pRBC --monitor Hgb and transfuse to keep Hgb >7  Chronic anticoagulation 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 - Initially was holding Eliquis and Lovenox at this time given her  hemoglobin drop, Eliquis since resumed --cont Eliquis  COVID-19 virus infection 06/08/2021 - Not symptomatic -Completed remdesivir for 3-day   Stage 3a chronic kidney disease ([place]) Primary hyperparathyroidism/vitamin D deficiency Metabolic acidosis, resolved   Severe malnutrition related to chronic illness -Evidence by percent weight loss, severe fat depletion and severe muscle depletion --regular diet with supplements   DVT prophylaxis: ME:QASTMHD Code Status: DNR  Family Communication:  Level of care: Telemetry Medical Dispo:   The patient is from: home Anticipated d/c is to: undetermined Anticipated d/c date is: undetermined Patient currently is not medically ready to d/c due to: receiving ECT   Subjective and Interval History:  Pt refused PT/OT, and said she just wants to stay in bed.   Objective: Vitals:   06/30/21 0500 06/30/21 0604 06/30/21 0756 06/30/21 1220  BP:  (!) 100/49 115/70 126/77  Pulse:  100 99 (!) 106  Resp:  20 18 18   Temp:  98 F (36.7 C) 98 F (36.7 C) 97.6 F (36.4 C)  TempSrc:  Oral    SpO2:  97% 96% 98%  Weight: 57.4 kg     Height:        Intake/Output Summary (Last 24 hours) at 06/30/2021 1612 Last data filed at 06/30/2021 1340 Gross per 24 hour  Intake 120  ml  Output 1300 ml  Net -1180 ml   Filed Weights   06/28/21 0500 06/29/21 0530 06/30/21 0500  Weight: 69.5 kg 57.4 kg 57.4 kg    Examination:   Constitutional: NAD, AAOx3 HEENT: conjunctivae and lids normal, EOMI CV: No cyanosis.   RESP: normal respiratory effort, on RA Psych: depressed mood and affect.     Data Reviewed: I have personally reviewed following labs and imaging studies  CBC: Recent Labs  Lab 06/24/21 0431 06/25/21 0640 06/26/21 0547 06/27/21 0456 06/29/21 0439  WBC 5.3 5.2 10.0 8.6 6.2  NEUTROABS 3.9 3.4 8.8* 6.6  --   HGB 8.6* 8.1* 9.0* 8.4* 7.9*  HCT 26.6* 25.6* 28.4* 26.7* 24.8*  MCV 90.8 94.8 93.4 95.4 95.4  PLT 222 224 278 284 229   Basic  Metabolic Panel: Recent Labs  Lab 06/24/21 0431 06/25/21 0640 06/26/21 0547 06/27/21 0456 06/29/21 0439  NA 138 139 137 139 139  K 4.2 4.2 3.9 4.2 3.6  CL 112* 111 109 111 114*  CO2 23 21* 23 24 22   GLUCOSE 108* 88 95 89 78  BUN 17 15 17 15 11   CREATININE 0.96 0.87 0.94 1.10* 1.18*  CALCIUM 8.8* 9.1 9.0 9.2 9.3  MG 2.3 2.1 2.0 1.9 1.9  PHOS 3.4  --  2.9 3.3 3.0   GFR: Estimated Creatinine Clearance: 38.5 mL/min (A) (by C-G formula based on SCr of 1.18 mg/dL (H)). Liver Function Tests: Recent Labs  Lab 06/24/21 0431 06/25/21 0640 06/26/21 0547 06/27/21 0456  AST 24 14* 13* 13*  ALT 28 21 17 13   ALKPHOS 59 50 55 50  BILITOT 0.4 0.4 0.4 0.5  PROT 4.8* 4.4* 4.6* 4.4*  ALBUMIN 2.0* 1.8* 2.0* 1.7*   No results for input(s): LIPASE, AMYLASE in the last 168 hours. No results for input(s): AMMONIA in the last 168 hours. Coagulation Profile: No results for input(s): INR, PROTIME in the last 168 hours. Cardiac Enzymes: No results for input(s): CKTOTAL, CKMB, CKMBINDEX, TROPONINI in the last 168 hours. BNP (last 3 results) No results for input(s): PROBNP in the last 8760 hours. HbA1C: No results for input(s): HGBA1C in the last 72 hours. CBG: Recent Labs  Lab 06/29/21 0114 06/29/21 0528 06/29/21 0814 06/29/21 1144 06/29/21 1546  GLUCAP 115* 81 96 84 92   Lipid Profile: No results for input(s): CHOL, HDL, LDLCALC, TRIG, CHOLHDL, LDLDIRECT in the last 72 hours. Thyroid Function Tests: No results for input(s): TSH, T4TOTAL, FREET4, T3FREE, THYROIDAB in the last 72 hours. Anemia Panel: No results for input(s): VITAMINB12, FOLATE, FERRITIN, TIBC, IRON, RETICCTPCT in the last 72 hours. Sepsis Labs: No results for input(s): PROCALCITON, LATICACIDVEN in the last 168 hours.  Recent Results (from the past 240 hour(s))  CULTURE, BLOOD (ROUTINE X 2) w Reflex to ID Panel     Status: None   Collection Time: 06/20/21  4:42 PM   Specimen: BLOOD RIGHT ARM  Result Value Ref  Range Status   Specimen Description BLOOD RIGHT ARM  Final   Special Requests   Final    BOTTLES DRAWN AEROBIC AND ANAEROBIC Blood Culture adequate volume   Culture   Final    NO GROWTH 5 DAYS Performed at [facility], [address] [place], [place] [address]    Report Status 06/25/2021 FINAL  Final  CULTURE, BLOOD (ROUTINE X 2) w Reflex to ID Panel     Status: None   Collection Time: 06/20/21  4:53 PM   Specimen: BLOOD RIGHT HAND  Result Value Ref Range Status   Specimen Description BLOOD RIGHT HAND  Final   Special Requests   Final    BOTTLES DRAWN AEROBIC AND ANAEROBIC Blood Culture adequate volume   Culture   Final    NO GROWTH 5 DAYS Performed at [facility], [address] [place], [place] [address]    Report Status 06/25/2021 FINAL  Final  Urine Culture     Status: Abnormal   Collection Time: 06/21/21  7:30 AM   Specimen: Urine, Random  Result Value Ref Range Status   Specimen Description   Final    URINE, RANDOM Performed at [facility], [address], [place], [place] [address]    Special Requests   Final    NONE Performed at [facility], [place], [place] [address]    Culture 70,000 COLONIES/mL PSEUDOMONAS AERUGINOSA (A)  Final   Report Status 06/23/2021 FINAL  Final   Organism ID, Bacteria PSEUDOMONAS AERUGINOSA (A)  Final      Susceptibility   Pseudomonas aeruginosa - MIC*    CEFTAZIDIME 2 SENSITIVE Sensitive     CIPROFLOXACIN <=0.25 SENSITIVE Sensitive     GENTAMICIN <=1 SENSITIVE Sensitive     IMIPENEM 2 SENSITIVE Sensitive     PIP/TAZO <=4 SENSITIVE Sensitive     CEFEPIME 2 SENSITIVE Sensitive     * 70,000 COLONIES/mL PSEUDOMONAS AERUGINOSA  C Difficile Quick Screen w PCR reflex     Status: Abnormal   Collection Time: 06/21/21  1:48 PM   Specimen: STOOL  Result Value Ref Range Status   C Diff antigen POSITIVE (A) NEGATIVE Final   C Diff toxin POSITIVE (A) NEGATIVE Final   C Diff  interpretation Toxin producing C. difficile detected.  Final    Comment: CRITICAL RESULT CALLED TO, READ BACK BY AND VERIFIED WITH: [name] HUSKNIS 06/21/21 1455 Performed at [facility], [address], [place], [place] [address]       Radiology Studies: No results found.   Scheduled Meds:  (feeding supplement) PROSource Plus  30 mL Oral TID BM   sodium chloride   Intravenous Once   apixaban  5 mg Oral BID   Chlorhexidine Gluconate Cloth  6 each Topical Daily   cyanocobalamin  1,000 mcg Intramuscular Daily   diphenhydrAMINE  50 mg Intravenous Once   haloperidol lactate  0.5 mg Intravenous QHS   mirtazapine  15 mg Oral QHS   multivitamin with minerals  1 tablet Oral Daily   tamsulosin  0.4 mg Oral QPC supper   thiamine  100 mg Oral Daily   vancomycin  125 mg Per Tube Q6H   Vitamin D (Ergocalciferol)  50,000 Units Oral Q7 days   Continuous Infusions:     LOS: 11 days     Enzo Bi, MD [facility] If 7PM-7AM, please contact night-coverage 06/30/2021, 4:12 PM

## 2021-06-30 NOTE — Progress Notes (Signed)
°   06/30/21 1706  Assess: MEWS Score  Temp 98.2 F (36.8 C)  BP 140/90  Pulse Rate (!) 114  Resp 18  SpO2 100 %  O2 Device Room Air  Assess: MEWS Score  MEWS Temp 0  MEWS Systolic 0  MEWS Pulse 2  MEWS RR 0  MEWS LOC 0  MEWS Score 2  MEWS Score Color Yellow  Assess: if the MEWS score is Yellow or Red  Were vital signs taken at a resting state? Yes  Focused Assessment Change from prior assessment (see assessment flowsheet)  Does the patient meet 2 or more of the SIRS criteria? Yes  Does the patient have a confirmed or suspected source of infection? No  Take Vital Signs  Increase Vital Sign Frequency  Yellow: Q 2hr X 2 then Q 4hr X 2, if remains yellow, continue Q 4hrs  Escalate  MEWS: Escalate Yellow: discuss with charge nurse/RN and consider discussing with provider and RRT  Notify: Charge Nurse/RN  Name of Charge Nurse/RN Notified Estill Bamberg RN  Date Charge Nurse/RN Notified 06/30/21  Time Charge Nurse/RN Notified 1720  Notify: Provider  Provider Name/Title Dr Billie Ruddy  Date Provider Notified 06/30/21  Notification Type Page (secure chat)  Provider response No new orders  Assess: SIRS CRITERIA  SIRS Temperature  0  SIRS Pulse 1  SIRS Respirations  0  SIRS WBC 0  SIRS Score Sum  1

## 2021-07-01 DIAGNOSIS — F323 Major depressive disorder, single episode, severe with psychotic features: Secondary | ICD-10-CM | POA: Diagnosis not present

## 2021-07-01 MED ORDER — VANCOMYCIN 50 MG/ML ORAL SOLUTION
125.0000 mg | Freq: Four times a day (QID) | ORAL | Status: AC
Start: 2021-07-01 — End: 2021-07-03
  Administered 2021-07-01 – 2021-07-03 (×7): 125 mg via ORAL
  Filled 2021-07-01 (×9): qty 2.5

## 2021-07-01 NOTE — Anesthesia Postprocedure Evaluation (Signed)
Anesthesia Post Note  Patient: ENIOLA CERULLO  Procedure(s) Performed: ECT TX  Patient location during evaluation: PACU Anesthesia Type: General Level of consciousness: awake and alert Pain management: pain level controlled Vital Signs Assessment: post-procedure vital signs reviewed and stable Respiratory status: spontaneous breathing, nonlabored ventilation, respiratory function stable and patient connected to nasal cannula oxygen Cardiovascular status: blood pressure returned to baseline and stable Postop Assessment: no apparent nausea or vomiting Anesthetic complications: no   No notable events documented.   Last Vitals:  Vitals:   06/27/21 1430 06/27/21 1442  BP:  119/88  Pulse:  (!) 112  Resp:  15  Temp: 36.9 C 36.7 C  SpO2: 95% 96%    Last Pain:  Vitals:   06/27/21 1424  TempSrc:   PainSc: 0-No pain                 Molli Barrows

## 2021-07-01 NOTE — Progress Notes (Signed)
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[facility]) Active Problems:   Moderate protein-calorie malnutrition ([place])   Primary hyperparathyroidism ([place])   Dysphagia   COVID-19 virus infection 06/08/2021   Nasogastric tube present   Chronic deep vein thrombosis (DVT) (HCC)   Chronic anticoagulation   ABLA (acute blood loss anemia)   Acute urinary retention   Foley catheter in place   Stage 3a chronic kidney disease (HCC)   At high risk for inadequate nutritional intake   Gross hematuria   Fever   Aspiration pneumonia of right lung due to gastric secretions [facility])   The patient is a 76 year old Caucasian female with a past medical history significant for but not due to CKD stage IIIa, hypertension, vitamin D deficiency, chronic DVT on Eliquis, history of psychotic depression with refusal to eat who was admitted as a transfer from [facility] for consideration of ECT by Dr. Weber Cooks of psychiatry.  She was admitted to [facility] from 06/08/21-06/19/21 after presenting with failure to thrive following a several week history of complaints of difficulty swallowing with decreased oral intake and subsequent development of confusion and hallucinations.  She had outpatient work-up include brain MRI as well as MRV which was unremarkable.  On admission she is found to have an AKI and several electrolyte abnormalities.  While at [facility] she refused to eat and refused further evaluation with esophagram to evaluate her sensation of dysphagia.  She is evaluated by psychiatry who determined her to have capacity for medical decision-making and so an NG tube was placed for force-feeding.  Palliative care consult was also done.  Due to her poor response to oral antidepressants ECG was recommended and thus arranged to transfer to [facility].  On 06/08/2020 she tested positive  for COVID and she received 3 days of remdesivir.  Most of her nutritional deficiencies related to inadequate oral intake were corrected prior to transfer and on the day of discharge she developed acute urinary tension a Foley catheter was placed and it was traumatic insertion with gross development of gross hematuria.  Hemoglobin did drop to 7.0   On the afternoon of 06/21/21 she spiked a temperature of 101.9 and became tachycardic and slightly hypotensive.  She is given IV fluid bolus and she is pancultured.  Further work-up revealed that she had a right lower lobe pneumonia so she was started on IV Unasyn.  She had some loose stools and this was tested for C. difficile however we can consider C. difficile colonization given that she no longer spiking temperatures and afebrile and has no leukocytosis.  SLP evaluated and recommending a clear liquid diet with thins in addition to an NG tube.  Given that she has a recent pneumonia and anesthesia felt it would be safer to wait a couple more days and for ECT treatment until that time to help clear her pneumonia.  She has now undergone ECT treatment Monday, 06/25/2021. Changed her antibiotics from IV Unasyn to p.o. Augmentin for her Pneunmonia and now they are to stop today. Given that she continues to have some mild loose stools I spoke with ID who feels that since the patient was positive toxin producing C. difficile empirically treat with vancomycin for 10 days.  Foley catheter has been removed.  And she has had a positive response to ECT   Psychotic depression  placing patient at high nutritional risk  Need for electroconvulsive therapy --cont Remeron to 15 mg nightly, per psych --ECT per psych, next session Monday   Inadequate oral nutritional intake  Nutritional deficiencies requiring NGT for force-feeding Severe protein-calorie malnutrition ([place]) -tube feed started due to pt's refusal to eat.  Pt pulled out [place]hoff overnight 1/31 --Pt passed bedside  swallow eval, started diet, eating better now. --cont regular diet --NPO midnight prior to ECT   Sepsis 2/2 Aspiration PNA  -Became febrile and tachycardic, likely could have aspirated with her NG tube in place -Initiate IV Unasyn x3 days and transitioned to p.o. Augmentin, completed 7-day course.  Diarrhea -C. difficile was checked and she is positive for toxin producing C. difficile  -ID recommends just treating empirically with vancomycin 125 mg p.o. 4 times daily for 10 days --cont oral vanc  Suspected Lung Nodule -As Above noted to be 1.7 x 0.8 cm Left Upper Lobe Opacity -CT of the Chest with Contrast done and it showed "Nodular opacity seen on chest radiograph corresponds to a ground-glass opacity with internal lucency in the superior aspect of the LEFT lower lobe. Differential considerations include infection, sequela of pulmonary infarction, or neoplasm. --Recommend follow-up CT in 3 months   Bacteriuria and pyuria without evidence of infection -When she spiked a temperature few days ago we ordered a urinalysis which showed a hazy appearance with moderate hemoglobin, moderate leukocytes, negative nitrites, rare bacteria, 21-50 RBCs, no squamous epithelial cells, greater than 50 WBCs -Urine culture showed 70,000 colony-forming units of Pseudomonas which which was pansensitive but she is asymptomatic -Will not treat her Pseudomonas UTI as she has no symptoms    Thrombocytopenia, improved -Patient's platelet count went from 181 ->144 -> 132 -> 151 -> 180 -> 222 -> 224 and is now 278 and improved   Acute urinary retention, resolved - Foley catheter was inserted but since d/c'ed --Continue Tamsulosin   ABLA (acute blood loss anemia) 2/2 Gross hematuria secondary to traumatic Foley --s/p 1u pRBC --monitor Hgb and transfuse to keep Hgb >7  Chronic anticoagulation 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 - Initially was holding Eliquis and Lovenox at this time given her  hemoglobin drop, Eliquis since resumed --cont Eliquis  COVID-19 virus infection 06/08/2021 - Not symptomatic -Completed remdesivir for 3-day   Stage 3a chronic kidney disease ([place]) Primary hyperparathyroidism/vitamin D deficiency Metabolic acidosis, resolved   Severe malnutrition related to chronic illness -Evidence by percent weight loss, severe fat depletion and severe muscle depletion --regular diet with supplements   DVT prophylaxis: RJ:JOACZYS Code Status: DNR  Family Communication:  Level of care: Telemetry Medical Dispo:   The patient is from: home Anticipated d/c is to: home Anticipated d/c date is: undetermined Patient currently is not medically ready to d/c due to: receiving ECT   Subjective and Interval History:  Pt continues to refuse to get out of bed, but does turn herself in bed.     Objective: Vitals:   06/30/21 2349 07/01/21 0457 07/01/21 0758 07/01/21 1121  BP: (!) 143/85 (!) 100/59 137/80 130/80  Pulse: (!) 110 97 95 100  Resp: 16 16 16 16   Temp: 98.4 F (36.9 C) 97.9 F (36.6 C) (!) 97.4 F (36.3 C) 98 F (36.7 C)  TempSrc:  Oral Oral Oral  SpO2: 100% 98% 100% 99%  Weight:      Height:       No intake or output data in the 24 hours ending 07/01/21 1630  Filed Weights   06/28/21 0500 06/29/21 0530 06/30/21 0500  Weight: 69.5 kg 57.4 kg 57.4 kg    Examination:   Constitutional: NAD, AAOx3 HEENT: conjunctivae and lids normal, EOMI CV: No cyanosis.   RESP: normal respiratory effort, on RA Extremities: No effusions, edema in BLE SKIN: warm, dry Neuro: II - XII grossly intact.     Data Reviewed: I have personally reviewed following labs and imaging studies  CBC: Recent Labs  Lab 06/25/21 0640 06/26/21 0547 06/27/21 0456 06/29/21 0439  WBC 5.2 10.0 8.6 6.2  NEUTROABS 3.4 8.8* 6.6  --   HGB 8.1* 9.0* 8.4* 7.9*  HCT 25.6* 28.4* 26.7* 24.8*  MCV 94.8 93.4 95.4 95.4  PLT 224 278 284 144   Basic Metabolic Panel: Recent Labs   Lab 06/25/21 0640 06/26/21 0547 06/27/21 0456 06/29/21 0439  NA 139 137 139 139  K 4.2 3.9 4.2 3.6  CL 111 109 111 114*  CO2 21* 23 24 22   GLUCOSE 88 95 89 78  BUN 15 17 15 11   CREATININE 0.87 0.94 1.10* 1.18*  CALCIUM 9.1 9.0 9.2 9.3  MG 2.1 2.0 1.9 1.9  PHOS  --  2.9 3.3 3.0   GFR: Estimated Creatinine Clearance: 38.5 mL/min (A) (by C-G formula based on SCr of 1.18 mg/dL (H)). Liver Function Tests: Recent Labs  Lab 06/25/21 0640 06/26/21 0547 06/27/21 0456  AST 14* 13* 13*  ALT 21 17 13   ALKPHOS 50 55 50  BILITOT 0.4 0.4 0.5  PROT 4.4* 4.6* 4.4*  ALBUMIN 1.8* 2.0* 1.7*   No results for input(s): LIPASE, AMYLASE in the last 168 hours. No results for input(s): AMMONIA in the last 168 hours. Coagulation Profile: No results for input(s): INR, PROTIME in the last 168 hours. Cardiac Enzymes: No results for input(s): CKTOTAL, CKMB, CKMBINDEX, TROPONINI in the last 168 hours. BNP (last 3 results) No results for input(s): PROBNP in the last 8760 hours. HbA1C: No results for input(s): HGBA1C in the last 72 hours. CBG: Recent Labs  Lab 06/29/21 0114 06/29/21 0528 06/29/21 0814 06/29/21 1144 06/29/21 1546  GLUCAP 115* 81 96 84 92   Lipid Profile: No results for input(s): CHOL, HDL, LDLCALC, TRIG, CHOLHDL, LDLDIRECT in the last 72 hours. Thyroid Function Tests: No results for input(s): TSH, T4TOTAL, FREET4, T3FREE, THYROIDAB in the last 72 hours. Anemia Panel: No results for input(s): VITAMINB12, FOLATE, FERRITIN, TIBC, IRON, RETICCTPCT in the last 72 hours. Sepsis Labs: No results for input(s): PROCALCITON, LATICACIDVEN in the last 168 hours.  No results found for this or any previous visit (from the past 240 hour(s)).     Radiology Studies: No results found.   Scheduled Meds:  (feeding supplement) PROSource Plus  30 mL Oral TID BM   sodium chloride   Intravenous Once   apixaban  5 mg Oral BID   Chlorhexidine Gluconate Cloth  6 each Topical Daily    cyanocobalamin  1,000 mcg Intramuscular Daily   diphenhydrAMINE  50 mg Intravenous Once   haloperidol lactate  0.5 mg Intravenous QHS   mirtazapine  15 mg Oral QHS   multivitamin with minerals  1 tablet Oral Daily   tamsulosin  0.4 mg Oral QPC supper   thiamine  100 mg Oral Daily   vancomycin  125 mg Per Tube Q6H   Vitamin D (Ergocalciferol)  50,000 Units Oral Q7 days   Continuous Infusions:     LOS: 12 days     Enzo Bi, MD [facility] If 7PM-7AM,  please contact night-coverage 07/01/2021, 4:30 PM

## 2021-07-02 ENCOUNTER — Inpatient Hospital Stay: Payer: Medicare Other | Admitting: Anesthesiology

## 2021-07-02 ENCOUNTER — Inpatient Hospital Stay (HOSPITAL_COMMUNITY)
Admission: AD | Admit: 2021-07-02 | Discharge: 2021-07-02 | Disposition: A | Discharge status: Home / Self Care | Payer: Medicare Other | Source: Other Acute Inpatient Hospital | Attending: Internal Medicine | Admitting: Internal Medicine

## 2021-07-02 ENCOUNTER — Other Ambulatory Visit: Payer: Self-pay | Admitting: Psychiatry

## 2021-07-02 DIAGNOSIS — F323 Major depressive disorder, single episode, severe with psychotic features: Secondary | ICD-10-CM | POA: Diagnosis not present

## 2021-07-02 DIAGNOSIS — F332 Major depressive disorder, recurrent severe without psychotic features: Secondary | ICD-10-CM | POA: Diagnosis not present

## 2021-07-02 LAB — BASIC METABOLIC PANEL
Anion gap: 4 — ABNORMAL LOW (ref 5–15)
BUN: 21 mg/dL (ref 8–23)
CO2: 23 mmol/L (ref 22–32)
Calcium: 9.9 mg/dL (ref 8.9–10.3)
Chloride: 116 mmol/L — ABNORMAL HIGH (ref 98–111)
Creatinine, Ser: 1.34 mg/dL — ABNORMAL HIGH (ref 0.44–1.00)
GFR, Estimated: 42 mL/min — ABNORMAL LOW (ref >60)
Glucose, Bld: 87 mg/dL (ref 70–99)
Potassium: 3.3 mmol/L — ABNORMAL LOW (ref 3.5–5.1)
Sodium: 143 mmol/L (ref 135–145)

## 2021-07-02 LAB — CBC
HCT: 26.3 % — ABNORMAL LOW (ref 36.0–46.0)
Hemoglobin: 8.2 g/dL — ABNORMAL LOW (ref 12.0–15.0)
MCH: 29.2 pg (ref 26.0–34.0)
MCHC: 31.2 g/dL (ref 30.0–36.0)
MCV: 93.6 fL (ref 80.0–100.0)
Platelets: 410 10*3/uL — ABNORMAL HIGH (ref 150–400)
RBC: 2.81 MIL/uL — ABNORMAL LOW (ref 3.87–5.11)
RDW: 15.2 % (ref 11.5–15.5)
WBC: 5.7 10*3/uL (ref 4.0–10.5)
nRBC: 0 % (ref 0.0–0.2)

## 2021-07-02 LAB — MAGNESIUM: Magnesium: 2.1 mg/dL (ref 1.7–2.4)

## 2021-07-02 LAB — GLUCOSE, CAPILLARY
Glucose-Capillary: 169 mg/dL — ABNORMAL HIGH (ref 70–99)
Glucose-Capillary: 79 mg/dL (ref 70–99)

## 2021-07-02 MED ORDER — METHOHEXITAL SODIUM 100 MG/10ML IV SOSY
PREFILLED_SYRINGE | INTRAVENOUS | Status: DC | PRN
Start: 2021-07-02 — End: 2021-07-02
  Administered 2021-07-02: 70 mg via INTRAVENOUS

## 2021-07-02 MED ORDER — KETOROLAC TROMETHAMINE 30 MG/ML IJ SOLN: 15.0000 mg | Freq: Once | INTRAMUSCULAR | Status: DC

## 2021-07-02 MED ORDER — MIDAZOLAM HCL 2 MG/2ML IJ SOLN: 2.0000 mg | Freq: Once | INTRAMUSCULAR | Status: DC

## 2021-07-02 MED ORDER — MIDAZOLAM HCL 2 MG/2ML IJ SOLN
INTRAMUSCULAR | Status: AC
  Filled 2021-07-02: qty 2

## 2021-07-02 MED ORDER — SODIUM CHLORIDE 0.9 % IV SOLN: 500.0000 mL | Freq: Once | INTRAVENOUS | Status: DC

## 2021-07-02 MED ORDER — METHOHEXITAL SODIUM 0.5 G IJ SOLR
INTRAMUSCULAR | Status: AC
  Filled 2021-07-02: qty 500

## 2021-07-02 MED ORDER — GLYCOPYRROLATE 0.2 MG/ML IJ SOLN: 0.1000 mg | Freq: Once | INTRAMUSCULAR | Status: DC

## 2021-07-02 MED ORDER — MIDAZOLAM HCL 5 MG/5ML IJ SOLN
INTRAMUSCULAR | Status: DC | PRN
Start: 2021-07-02 — End: 2021-07-02
  Administered 2021-07-02: 2 mg via INTRAVENOUS

## 2021-07-02 MED ORDER — SUCCINYLCHOLINE CHLORIDE 200 MG/10ML IV SOSY
PREFILLED_SYRINGE | INTRAVENOUS | Status: AC
  Filled 2021-07-02: qty 10

## 2021-07-02 MED ORDER — LIDOCAINE HCL (PF) 2 % IJ SOLN
INTRAMUSCULAR | Status: DC | PRN
Start: 2021-07-02 — End: 2021-07-02
  Administered 2021-07-02: 10 mg via INTRADERMAL

## 2021-07-02 MED ORDER — POTASSIUM CHLORIDE CRYS ER 20 MEQ PO TBCR
40.0000 meq | EXTENDED_RELEASE_TABLET | Freq: Once | ORAL | Status: AC
  Administered 2021-07-02: 40 meq via ORAL
  Filled 2021-07-02: qty 2

## 2021-07-02 MED ORDER — SODIUM CHLORIDE 0.9 % IV SOLN: 500.0000 mL | Freq: Once | INTRAVENOUS | Status: AC

## 2021-07-02 MED ORDER — SUCCINYLCHOLINE CHLORIDE 200 MG/10ML IV SOSY
PREFILLED_SYRINGE | INTRAVENOUS | Status: DC | PRN
Start: 2021-07-02 — End: 2021-07-02
  Administered 2021-07-02: 90 mg via INTRAVENOUS

## 2021-07-02 NOTE — H&P (Signed)
Amber [name] is an 76 y.o. female.   Chief Complaint: still v hopeless HPI: severe depression  Past Medical History:  Diagnosis Date   Anxiety    Hypertension     History reviewed. No pertinent surgical history.  Family History  Problem Relation Age of Onset   High blood pressure Maternal Grandmother    Social History:  reports that she has quit smoking. Her smoking use included cigarettes. She has never used smokeless tobacco. She reports that she does not currently use alcohol. She reports that she does not currently use drugs.  Allergies:  Allergies  Allergen Reactions   Shellfish Allergy     "I pass out"    (Not in a hospital admission)   Results for orders placed or performed during the hospital encounter of 06/19/21 (from the past 48 hour(s))  Basic metabolic panel     Status: Abnormal   Collection Time: 07/02/21  5:18 AM  Result Value Ref Range   Sodium 143 135 - 145 mmol/L   Potassium 3.3 (L) 3.5 - 5.1 mmol/L   Chloride 116 (H) 98 - 111 mmol/L   CO2 23 22 - 32 mmol/L   Glucose, Bld 87 70 - 99 mg/dL    Comment: Glucose reference range applies only to samples taken after fasting for at least 8 hours.   BUN 21 8 - 23 mg/dL   Creatinine, Ser 1.34 (H) 0.44 - 1.00 mg/dL   Calcium 9.9 8.9 - 10.3 mg/dL   GFR, Estimated 42 (L) >60 mL/min    Comment: (NOTE) Calculated using the CKD-EPI Creatinine Equation (2021)    Anion gap 4 (L) 5 - 15    Comment: Performed at [facility], [address] [place], [place] [address]  CBC     Status: Abnormal   Collection Time: 07/02/21  5:18 AM  Result Value Ref Range   WBC 5.7 4.0 - 10.5 K/uL   RBC 2.81 (L) 3.87 - 5.11 MIL/uL   Hemoglobin 8.2 (L) 12.0 - 15.0 g/dL   HCT 26.3 (L) 36.0 - 46.0 %   MCV 93.6 80.0 - 100.0 fL   MCH 29.2 26.0 - 34.0 pg   MCHC 31.2 30.0 - 36.0 g/dL   RDW 15.2 11.5 - 15.5 %   Platelets 410 (H) 150 - 400 K/uL   nRBC 0.0 0.0 - 0.2 %    Comment: Performed at [facility], [address] [place], [place] [address]  Magnesium     Status: None   Collection Time: 07/02/21  5:18 AM  Result Value Ref Range   Magnesium 2.1 1.7 - 2.4 mg/dL    Comment: Performed at [facility], [address] [place], [place] [address]   No results found.  Review of Systems  Constitutional:  Positive for fatigue.  HENT: Negative.    Eyes: Negative.   Respiratory: Negative.    Cardiovascular: Negative.   Gastrointestinal: Negative.   Musculoskeletal: Negative.   Skin: Negative.   Neurological:  Positive for weakness.  Psychiatric/Behavioral:  Positive for dysphoric mood.    Blood pressure 117/65, pulse 95, temperature 98.1 F (36.7 C), temperature source Tympanic, resp. rate 16, height 5\' 6"  (1.676 m), weight 57.4 kg, SpO2 98 %. Physical Exam Constitutional:      Appearance: She is well-developed. She is ill-appearing.  HENT:     Head: Normocephalic and atraumatic.  Eyes:     Conjunctiva/sclera: Conjunctivae normal.     Pupils: Pupils are equal, round, and  reactive to light.  Cardiovascular:     Heart sounds: Normal heart sounds.  Pulmonary:     Effort: Pulmonary effort is normal.  Abdominal:     Palpations: Abdomen is soft.  Musculoskeletal:        General: Normal range of motion.     Cervical back: Normal range of motion.  Skin:    General: Skin is warm and dry.  Neurological:     Mental Status: She is alert.  Psychiatric:        Attention and Perception: Attention normal.        Mood and Affect: Mood is depressed.        Speech: Speech is delayed.        Thought Content: Thought content does not include suicidal ideation.     Assessment/Plan Continue ect  Alethia Berthold, MD 07/02/2021, 12:30 PM

## 2021-07-02 NOTE — Progress Notes (Signed)
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[facility]) Active Problems:   Moderate protein-calorie malnutrition ([place])   Primary hyperparathyroidism ([place])   Dysphagia   COVID-19 virus infection 06/08/2021   Nasogastric tube present   Chronic deep vein thrombosis (DVT) (HCC)   Chronic anticoagulation   ABLA (acute blood loss anemia)   Acute urinary retention   Foley catheter in place   Stage 3a chronic kidney disease (HCC)   At high risk for inadequate nutritional intake   Gross hematuria   Fever   Aspiration pneumonia of right lung due to gastric secretions [facility])   The patient is a 76 year old Caucasian female with a past medical history significant for but not due to CKD stage IIIa, hypertension, vitamin D deficiency, chronic DVT on Eliquis, history of psychotic depression with refusal to eat who was admitted as a transfer from [facility] for consideration of ECT by Dr. Weber Cooks of psychiatry.  She was admitted to [facility] from 06/08/21-06/19/21 after presenting with failure to thrive following a several week history of complaints of difficulty swallowing with decreased oral intake and subsequent development of confusion and hallucinations.  She had outpatient work-up include brain MRI as well as MRV which was unremarkable.  On admission she is found to have an AKI and several electrolyte abnormalities.  While at [facility] she refused to eat and refused further evaluation with esophagram to evaluate her sensation of dysphagia.  She is evaluated by psychiatry who determined her to have capacity for medical decision-making and so an NG tube was placed for force-feeding.  Palliative care consult was also done.  Due to her poor response to oral antidepressants ECG was recommended and thus arranged to transfer to [facility].  On 06/08/2020 she tested positive  for COVID and she received 3 days of remdesivir.  Most of her nutritional deficiencies related to inadequate oral intake were corrected prior to transfer and on the day of discharge she developed acute urinary tension a Foley catheter was placed and it was traumatic insertion with gross development of gross hematuria.  Hemoglobin did drop to 7.0   On the afternoon of 06/21/21 she spiked a temperature of 101.9 and became tachycardic and slightly hypotensive.  She is given IV fluid bolus and she is pancultured.  Further work-up revealed that she had a right lower lobe pneumonia so she was started on IV Unasyn.  She had some loose stools and this was tested for C. difficile however we can consider C. difficile colonization given that she no longer spiking temperatures and afebrile and has no leukocytosis.  SLP evaluated and recommending a clear liquid diet with thins in addition to an NG tube.  Given that she has a recent pneumonia and anesthesia felt it would be safer to wait a couple more days and for ECT treatment until that time to help clear her pneumonia.  She has now undergone ECT treatment Monday, 06/25/2021. Changed her antibiotics from IV Unasyn to p.o. Augmentin for her Pneunmonia and now they are to stop today. Given that she continues to have some mild loose stools I spoke with ID who feels that since the patient was positive toxin producing C. difficile empirically treat with vancomycin for 10 days.  Foley catheter has been removed.  And she has had a positive response to ECT   Psychotic depression  placing patient at high nutritional risk  Need for electroconvulsive therapy --cont Remeron to 15 mg nightly, per psych --ECT per psych, usually on MWF   Inadequate oral nutritional intake  Nutritional deficiencies requiring NGT for force-feeding Severe protein-calorie malnutrition ([place]) -tube feed started due to pt's refusal to eat.  Pt pulled out Dobhoff overnight 1/31 --Pt passed bedside swallow  eval, started diet, eating better now. --cont regular diet --NPO midnight prior to ECT   Sepsis 2/2 Aspiration PNA  -Became febrile and tachycardic, likely could have aspirated with her NG tube in place -Initiate IV Unasyn x3 days and transitioned to p.o. Augmentin, completed 7-day course.  Diarrhea -C. difficile was checked and she is positive for toxin producing C. difficile  -ID recommends just treating empirically with vancomycin 125 mg p.o. 4 times daily for 10 days --cont oral vanc  Suspected Lung Nodule -As Above noted to be 1.7 x 0.8 cm Left Upper Lobe Opacity -CT of the Chest with Contrast done and it showed "Nodular opacity seen on chest radiograph corresponds to a ground-glass opacity with internal lucency in the superior aspect of the LEFT lower lobe. Differential considerations include infection, sequela of pulmonary infarction, or neoplasm. --Recommend follow-up CT in 3 months   Bacteriuria and pyuria without evidence of infection -When she spiked a temperature few days ago we ordered a urinalysis which showed a hazy appearance with moderate hemoglobin, moderate leukocytes, negative nitrites, rare bacteria, 21-50 RBCs, no squamous epithelial cells, greater than 50 WBCs -Urine culture showed 70,000 colony-forming units of Pseudomonas which which was pansensitive but she is asymptomatic -Will not treat her Pseudomonas UTI as she has no symptoms    Thrombocytopenia, improved -Patient's platelet count went from 181 ->144 -> 132 -> 151 -> 180 -> 222 -> 224 and is now 278 and improved   Acute urinary retention, resolved - Foley catheter was inserted but since d/c'ed --Continue Tamsulosin   ABLA (acute blood loss anemia) 2/2 Gross hematuria secondary to traumatic Foley --s/p 1u pRBC --monitor Hgb and transfuse to keep Hgb >7  Chronic anticoagulation 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 - Initially was holding Eliquis and Lovenox at this time given her hemoglobin  drop, Eliquis since resumed --cont Eliquis  COVID-19 virus infection 06/08/2021 - Not symptomatic -Completed remdesivir for 3-day   Stage 3a chronic kidney disease ([place]) Primary hyperparathyroidism/vitamin D deficiency Metabolic acidosis, resolved   Severe malnutrition related to chronic illness -Evidence by percent weight loss, severe fat depletion and severe muscle depletion --regular diet with supplements   DVT prophylaxis: GE:ZMOQHUT Code Status: DNR  Family Communication:  Level of care: Telemetry Medical Dispo:   The patient is from: home Anticipated d/c is to: home Anticipated d/c date is: undetermined Patient currently is not medically ready to d/c due to: receiving ECT   Subjective and Interval History:  Pt said she didn't know where she was and why she was here.  Received ECT today.  Asked for something to drink, and ordered her sweet tea.  Pt still didn't want to get out of bed.   Objective: Vitals:   07/02/21 0454 07/02/21 0816 07/02/21 1459 07/02/21 1717  BP: 126/61 118/65 126/79 137/83  Pulse: (!) 106 95 99 95  Resp: 16 16 18 17   Temp: 98.2 F (36.8 C) 98.1 F (36.7 C) 98.3 F (36.8 C) 97.8 F (36.6 C)  TempSrc: Oral Oral Oral   SpO2: 98% 98% 99% 99%  Weight:      Height:  Intake/Output Summary (Last 24 hours) at 07/02/2021 1855 Last data filed at 07/02/2021 1435 Gross per 24 hour  Intake 350 ml  Output 600 ml  Net -250 ml    Filed Weights   06/28/21 0500 06/29/21 0530 06/30/21 0500  Weight: 69.5 kg 57.4 kg 57.4 kg    Examination:   Constitutional: NAD, alert, oriented to person only today HEENT: conjunctivae and lids normal, EOMI CV: No cyanosis.   RESP: normal respiratory effort, on RA SKIN: warm, dry Neuro: II - XII grossly intact.   Psych: depressed mood and affect.     Data Reviewed: I have personally reviewed following labs and imaging studies  CBC: Recent Labs  Lab 06/26/21 0547 06/27/21 0456 06/29/21 0439  07/02/21 0518  WBC 10.0 8.6 6.2 5.7  NEUTROABS 8.8* 6.6  --   --   HGB 9.0* 8.4* 7.9* 8.2*  HCT 28.4* 26.7* 24.8* 26.3*  MCV 93.4 95.4 95.4 93.6  PLT 278 284 333 035*   Basic Metabolic Panel: Recent Labs  Lab 06/26/21 0547 06/27/21 0456 06/29/21 0439 07/02/21 0518  NA 137 139 139 143  K 3.9 4.2 3.6 3.3*  CL 109 111 114* 116*  CO2 23 24 22 23   GLUCOSE 95 89 78 87  BUN 17 15 11 21   CREATININE 0.94 1.10* 1.18* 1.34*  CALCIUM 9.0 9.2 9.3 9.9  MG 2.0 1.9 1.9 2.1  PHOS 2.9 3.3 3.0  --    GFR: Estimated Creatinine Clearance: 33.9 mL/min (A) (by C-G formula based on SCr of 1.34 mg/dL (H)). Liver Function Tests: Recent Labs  Lab 06/26/21 0547 06/27/21 0456  AST 13* 13*  ALT 17 13  ALKPHOS 55 50  BILITOT 0.4 0.5  PROT 4.6* 4.4*  ALBUMIN 2.0* 1.7*   No results for input(s): LIPASE, AMYLASE in the last 168 hours. No results for input(s): AMMONIA in the last 168 hours. Coagulation Profile: No results for input(s): INR, PROTIME in the last 168 hours. Cardiac Enzymes: No results for input(s): CKTOTAL, CKMB, CKMBINDEX, TROPONINI in the last 168 hours. BNP (last 3 results) No results for input(s): PROBNP in the last 8760 hours. HbA1C: No results for input(s): HGBA1C in the last 72 hours. CBG: Recent Labs  Lab 06/29/21 0814 06/29/21 1144 06/29/21 1546 06/30/21 0802 06/30/21 1217  GLUCAP 96 84 92 169* 79   Lipid Profile: No results for input(s): CHOL, HDL, LDLCALC, TRIG, CHOLHDL, LDLDIRECT in the last 72 hours. Thyroid Function Tests: No results for input(s): TSH, T4TOTAL, FREET4, T3FREE, THYROIDAB in the last 72 hours. Anemia Panel: No results for input(s): VITAMINB12, FOLATE, FERRITIN, TIBC, IRON, RETICCTPCT in the last 72 hours. Sepsis Labs: No results for input(s): PROCALCITON, LATICACIDVEN in the last 168 hours.  No results found for this or any previous visit (from the past 240 hour(s)).     Radiology Studies: No results found.   Scheduled Meds:   (feeding supplement) PROSource Plus  30 mL Oral TID BM   sodium chloride   Intravenous Once   apixaban  5 mg Oral BID   Chlorhexidine Gluconate Cloth  6 each Topical Daily   cyanocobalamin  1,000 mcg Intramuscular Daily   diphenhydrAMINE  50 mg Intravenous Once   haloperidol lactate  0.5 mg Intravenous QHS   mirtazapine  15 mg Oral QHS   multivitamin with minerals  1 tablet Oral Daily   tamsulosin  0.4 mg Oral QPC supper   thiamine  100 mg Oral Daily   vancomycin  125 mg Oral Q6H  Vitamin D (Ergocalciferol)  50,000 Units Oral Q7 days   Continuous Infusions:     LOS: 13 days     Enzo Bi, MD [facility] If 7PM-7AM, please contact night-coverage 07/02/2021, 6:55 PM

## 2021-07-02 NOTE — Transfer of Care (Signed)
Immediate Anesthesia Transfer of Care Note  Patient: Amber [name]  Procedure(s) Performed: ECT TX  Patient Location: PACU  Anesthesia Type:General  Level of Consciousness: sedated  Airway & Oxygen Therapy: Patient Spontanous Breathing and Patient connected to face mask oxygen  Post-op Assessment: Report given to RN and Post -op Vital signs reviewed and stable  Post vital signs: Reviewed  Last Vitals:  Vitals Value Taken Time  BP    Temp    Pulse    Resp    SpO2      Last Pain:  Vitals:   07/02/21 1138  TempSrc:   PainSc: 0-No pain         Complications: No notable events documented.

## 2021-07-02 NOTE — Procedures (Signed)
ECT SERVICES Physicians Interval Evaluation & Treatment Note  Amber [name] Identification: Amber [name] MRN:  [id number] Date of Evaluation:  07/02/2021 TX #: 4  MADRS:   MMSE:   P.E. Findings:  Amber [name] still remains withdrawn and not very active physically.  Psychiatric Interval Note:  Continues to be negativistic and complaining to me although certainly more energetic and vocal  Subjective:  Amber [name] is a 76 y.o. female seen for evaluation for Electroconvulsive Therapy. Amber [name] continues to be negative believing she will never get well.  Treatment Summary:   []   Right Unilateral             [x]  Bilateral   % Energy : 1.0 ms 60%   Impedance: 2120 ohms  Seizure Energy Index: No reading  Postictal Suppression Index: No reading  Seizure Concordance Index: No reading  Medications  Pre Shock: Robinul 0.1 mg lidocaine 10 mg Brevital 70 mg succinylcholine 90 mg   Post Shock: Versed 2 mg  Seizure Duration: 36 seconds EMG 42 seconds EEG   Comments: Lots of artifact computer had a hard time reading it but a clear seizure that went well out to around 40 seconds.  Plan to continue index course next treatment Wednesday  Lungs:  [x]   Clear to auscultation               []  Other:   Heart:    [x]   Regular rhythm             []  irregular rhythm    [x]   Previous H&P reviewed, Amber [name] examined and there are NO CHANGES                 []   Previous H&P reviewed, Amber [name] examined and there are changes noted.   Alethia Berthold, MD 2/6/20234:26 PM

## 2021-07-02 NOTE — Anesthesia Preprocedure Evaluation (Signed)
Anesthesia Evaluation    Airway Mallampati: III     Mouth opening: Limited Mouth Opening  Dental  (+) Chipped   Pulmonary asthma , former smoker,     + decreased breath sounds      Cardiovascular Exercise Tolerance: Poor hypertension, Pt. on medications  Rate:Tachycardia     Neuro/Psych PSYCHIATRIC DISORDERS Anxiety Depression    GI/Hepatic Neg liver ROS, GERD  ,  Endo/Other  Hyperthyroidism   Renal/GU      Musculoskeletal negative musculoskeletal ROS (+)   Abdominal Normal abdominal exam  (+)   Peds negative pediatric ROS (+)  Hematology  (+) Blood dyscrasia, anemia ,   Anesthesia Other Findings Past Medical History: No date: Anxiety No date: Hypertension   Reproductive/Obstetrics negative OB ROS                             Anesthesia Physical  Anesthesia Plan  ASA: 3  Anesthesia Plan: General   Post-op Pain Management:    Induction: Intravenous  PONV Risk Score and Plan:   Airway Management Planned: Natural Airway and Mask  Additional Equipment:   Intra-op Plan:   Post-operative Plan:   Informed Consent: I have reviewed the patients History and Physical, chart, labs and discussed the procedure including the risks, benefits and alternatives for the proposed anesthesia with the patient or authorized representative who has indicated his/her understanding and acceptance.       Plan Discussed with: CRNA and Surgeon  Anesthesia Plan Comments:         Anesthesia Quick Evaluation

## 2021-07-02 NOTE — TOC Progression Note (Signed)
Transition of Care [facility]) - Progression Note    Patient Details  Name: Amber [name] MRN: [id number] Date of Birth: Aug 27, 1945  Transition of [facility]) CM/SW [place], RN Phone Number: 07/02/2021, 1:16 PM  Clinical Narrative:   Patient scheduled to have ECT today    Expected Discharge Plan:  (TBD) Barriers to Discharge: Continued Medical Work up  Expected Discharge Plan and Services Expected Discharge Plan:  (TBD)   Discharge Planning Services: CM Consult Post Acute Care Choice:  (TBD) Living arrangements for the past 2 months: Single Family Home                                       Social Determinants of Health (SDOH) Interventions    Readmission Risk Interventions No flowsheet data found.

## 2021-07-03 NOTE — Progress Notes (Signed)
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[facility]) Active Problems:   Moderate protein-calorie malnutrition ([place])   Primary hyperparathyroidism ([place])   Dysphagia   COVID-19 virus infection 06/08/2021   Nasogastric tube present   Chronic deep vein thrombosis (DVT) (HCC)   Chronic anticoagulation   ABLA (acute blood loss anemia)   Acute urinary retention   Foley catheter in place   Stage 3a chronic kidney disease (HCC)   At high risk for inadequate nutritional intake   Gross hematuria   Fever   Aspiration pneumonia of right lung due to gastric secretions [facility])   The patient is a 76 year old Caucasian female with a past medical history significant for but not due to CKD stage IIIa, hypertension, vitamin D deficiency, chronic DVT on Eliquis, history of psychotic depression with refusal to eat who was admitted as a transfer from [facility] for ECT by Dr. Weber Cooks of psychiatry.    She was admitted to [facility] from 06/08/21-06/19/21 after presenting with failure to thrive following a several week history of complaints of difficulty swallowing with decreased oral intake and subsequent development of confusion and hallucinations.  She had outpatient work-up include brain MRI as well as MRV which was unremarkable.  On admission she is found to have an AKI and several electrolyte abnormalities.  While at [facility] she refused to eat and refused further evaluation with esophagram to evaluate her sensation of dysphagia.  She is evaluated by psychiatry who determined her to not have capacity for medical decision-making and so an NG tube was placed for force-feeding.  Palliative care consult was also done.  Due to her poor response to oral antidepressants ECG was recommended and thus arranged to transfer to [facility].  On 06/08/2020 she tested positive for  COVID and she received 3 days of remdesivir.  Most of her nutritional deficiencies related to inadequate oral intake were corrected prior to transfer and on the day of discharge she developed acute urinary retention a Foley catheter was placed and it was traumatic insertion with gross development of gross hematuria.  Hemoglobin did drop to 7.0   On the afternoon of 06/21/21 she spiked a temperature of 101.9 and became tachycardic and slightly hypotensive.  She is given IV fluid bolus and she is pancultured.  Further work-up revealed that she had a right lower lobe pneumonia so she was started on IV Unasyn and completed a course.   Given that she continues to have some mild loose stools ID felt that since the patient was positive toxin producing C. difficile empirically treat with vancomycin for 10 days.  Foley catheter has been removed.  she appeared to have a positive response to ECT.   Psychotic depression placing patient at high nutritional risk  Need for electroconvulsive therapy --cont Remeron to 15 mg nightly, per psych --ECT per psych, usually on MWF   Inadequate oral nutritional intake  Nutritional deficiencies requiring NGT for force-feeding Severe protein-calorie malnutrition ([place]) -tube feed started due to pt's refusal to eat.  Pt pulled out Dobhoff overnight 1/31 --Pt passed bedside swallow eval, started diet, eating better now but still very little. --cont regular diet --NPO midnight prior to ECT   Sepsis 2/2 Aspiration PNA  -Became febrile and tachycardic, likely could have aspirated with her NG tube in place -Initiate IV Unasyn x3 days and transitioned  to p.o. Augmentin, completed 7-day course.  Diarrhea -C. difficile was checked and she is positive for toxin producing C. difficile  -ID recommends just treating empirically with vancomycin 125 mg p.o. 4 times daily for 10 days, completed.  Suspected Lung Nodule -As Above noted to be 1.7 x 0.8 cm Left Upper Lobe Opacity -CT  of the Chest with Contrast done and it showed "Nodular opacity seen on chest radiograph corresponds to a ground-glass opacity with internal lucency in the superior aspect of the LEFT lower lobe. Differential considerations include infection, sequela of pulmonary infarction, or neoplasm. --Recommend follow-up CT in 3 months   Bacteriuria and pyuria without evidence of infection -When she spiked a temperature few days ago we ordered a urinalysis which showed a hazy appearance with moderate hemoglobin, moderate leukocytes, negative nitrites, rare bacteria, 21-50 RBCs, no squamous epithelial cells, greater than 50 WBCs -Urine culture showed 70,000 colony-forming units of Pseudomonas which which was pansensitive but she is asymptomatic -Will not treat her Pseudomonas UTI as she has no symptoms    Thrombocytopenia, resolved   Acute urinary retention, resolved - Foley catheter was inserted but since d/c'ed --Continue Tamsulosin --encourage pt to void, avoid catherization.   ABLA (acute blood loss anemia) 2/2 Gross hematuria secondary to traumatic Foley --s/p 1u pRBC --encourage pt to void, avoid catherization.  Chronic anticoagulation 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 - Initially was holding Eliquis and Lovenox at this time given her hemoglobin drop, Eliquis since resumed --cont Eliquis  COVID-19 virus infection 06/08/2021 - Not symptomatic -Completed remdesivir for 3-day   Stage 3a chronic kidney disease ([place]) Primary hyperparathyroidism/vitamin D deficiency Metabolic acidosis, resolved   Severe malnutrition related to chronic illness -Evidence by percent weight loss, severe fat depletion and severe muscle depletion --regular diet with supplements   DVT prophylaxis: IN:OMVEH[facility] Code Status: DNR  Family Communication:  Level of care: Telemetry Medical Dispo:   The patient is from: home Anticipated d/c is to: home Anticipated d/c date is: undetermined Patient currently is  not medically ready to d/c due to: receiving ECT   Subjective and Interval History:  Pt seemed intermittently confused.  Sometimes oriented x4, sometimes pt said she couldn't remember anything.  Still didn't want to get out of bed.   Objective: Vitals:   07/03/21 1157 07/03/21 1205 07/03/21 1539 07/03/21 1656  BP: (!) 160/91 139/90 129/77   Pulse: (!) 117 (!) 110 (!) 115 (!) 109  Resp: 18  16   Temp: 97.8 F (36.6 C)  97.8 F (36.6 C)   TempSrc:      SpO2: 100% 99% 99% 98%  Weight:      Height:        Intake/Output Summary (Last 24 hours) at 07/03/2021 2033 Last data filed at 07/03/2021 1846 Gross per 24 hour  Intake 0 ml  Output 800 ml  Net -800 ml    Filed Weights   06/28/21 0500 06/29/21 0530 06/30/21 0500  Weight: 69.5 kg 57.4 kg 57.4 kg    Examination:   Constitutional: NAD, alert, oriented to person and place HEENT: conjunctivae and lids normal, EOMI CV: No cyanosis.   RESP: normal respiratory effort, on RA Extremities: No effusions, edema in BLE SKIN: warm, dry Neuro: II - XII grossly intact.   Psych: depressed mood and affect.     Data Reviewed: I have personally reviewed following labs and imaging studies  CBC: Recent Labs  Lab 06/27/21 0456 06/29/21 0439 07/02/21 0518  WBC 8.6 6.2  5.7  NEUTROABS 6.6  --   --   HGB 8.4* 7.9* 8.2*  HCT 26.7* 24.8* 26.3*  MCV 95.4 95.4 93.6  PLT 284 333 829*   Basic Metabolic Panel: Recent Labs  Lab 06/27/21 0456 06/29/21 0439 07/02/21 0518  NA 139 139 143  K 4.2 3.6 3.3*  CL 111 114* 116*  CO2 24 22 23   GLUCOSE 89 78 87  BUN 15 11 21   CREATININE 1.10* 1.18* 1.34*  CALCIUM 9.2 9.3 9.9  MG 1.9 1.9 2.1  PHOS 3.3 3.0  --    GFR: Estimated Creatinine Clearance: 33.9 mL/min (A) (by C-G formula based on SCr of 1.34 mg/dL (H)). Liver Function Tests: Recent Labs  Lab 06/27/21 0456  AST 13*  ALT 13  ALKPHOS 50  BILITOT 0.5  PROT 4.4*  ALBUMIN 1.7*   No results for input(s): LIPASE, AMYLASE in the  last 168 hours. No results for input(s): AMMONIA in the last 168 hours. Coagulation Profile: No results for input(s): INR, PROTIME in the last 168 hours. Cardiac Enzymes: No results for input(s): CKTOTAL, CKMB, CKMBINDEX, TROPONINI in the last 168 hours. BNP (last 3 results) No results for input(s): PROBNP in the last 8760 hours. HbA1C: No results for input(s): HGBA1C in the last 72 hours. CBG: Recent Labs  Lab 06/29/21 0814 06/29/21 1144 06/29/21 1546 06/30/21 0802 06/30/21 1217  GLUCAP 96 84 92 169* 79   Lipid Profile: No results for input(s): CHOL, HDL, LDLCALC, TRIG, CHOLHDL, LDLDIRECT in the last 72 hours. Thyroid Function Tests: No results for input(s): TSH, T4TOTAL, FREET4, T3FREE, THYROIDAB in the last 72 hours. Anemia Panel: No results for input(s): VITAMINB12, FOLATE, FERRITIN, TIBC, IRON, RETICCTPCT in the last 72 hours. Sepsis Labs: No results for input(s): PROCALCITON, LATICACIDVEN in the last 168 hours.  No results found for this or any previous visit (from the past 240 hour(s)).     Radiology Studies: No results found.   Scheduled Meds:  (feeding supplement) PROSource Plus  30 mL Oral TID BM   sodium chloride   Intravenous Once   apixaban  5 mg Oral BID   Chlorhexidine Gluconate Cloth  6 each Topical Daily   cyanocobalamin  1,000 mcg Intramuscular Daily   diphenhydrAMINE  50 mg Intravenous Once   haloperidol lactate  0.5 mg Intravenous QHS   mirtazapine  15 mg Oral QHS   multivitamin with minerals  1 tablet Oral Daily   tamsulosin  0.4 mg Oral QPC supper   thiamine  100 mg Oral Daily   vancomycin  125 mg Oral Q6H   Vitamin D (Ergocalciferol)  50,000 Units Oral Q7 days   Continuous Infusions:     LOS: 14 days     Enzo Bi, MD [facility] If 7PM-7AM, please contact night-coverage 07/03/2021, 8:33 PM

## 2021-07-03 NOTE — Anesthesia Postprocedure Evaluation (Signed)
Anesthesia Post Note  Patient: Amber [name]  Procedure(s) Performed: ECT TX  Patient location during evaluation: PACU Anesthesia Type: General Level of consciousness: awake and alert Pain management: pain level controlled Vital Signs Assessment: post-procedure vital signs reviewed and stable Respiratory status: spontaneous breathing, nonlabored ventilation, respiratory function stable and patient connected to nasal cannula oxygen Cardiovascular status: blood pressure returned to baseline and stable Postop Assessment: no apparent nausea or vomiting Anesthetic complications: no   No notable events documented.   Last Vitals:  Vitals:   07/02/21 1435 07/02/21 1440  BP:  114/75  Pulse: 95 (!) 101  Resp: (!) 32 14  Temp:    SpO2: 97% 98%    Last Pain:  Vitals:   07/02/21 1432  TempSrc:   PainSc: 0-No pain                 Martha Clan

## 2021-07-04 ENCOUNTER — Inpatient Hospital Stay: Payer: Medicare Other | Admitting: Anesthesiology

## 2021-07-04 ENCOUNTER — Inpatient Hospital Stay (HOSPITAL_COMMUNITY)
Admission: AD | Admit: 2021-07-04 | Discharge: 2021-07-04 | Disposition: A | Discharge status: Home / Self Care | Payer: Medicare Other | Source: Other Acute Inpatient Hospital | Attending: Internal Medicine | Admitting: Internal Medicine

## 2021-07-04 ENCOUNTER — Other Ambulatory Visit: Payer: Self-pay | Admitting: Psychiatry

## 2021-07-04 DIAGNOSIS — F333 Major depressive disorder, recurrent, severe with psychotic symptoms: Secondary | ICD-10-CM

## 2021-07-04 LAB — BASIC METABOLIC PANEL
Anion gap: 2 — ABNORMAL LOW (ref 5–15)
BUN: 21 mg/dL (ref 8–23)
CO2: 24 mmol/L (ref 22–32)
Calcium: 9.9 mg/dL (ref 8.9–10.3)
Chloride: 117 mmol/L — ABNORMAL HIGH (ref 98–111)
Creatinine, Ser: 1.38 mg/dL — ABNORMAL HIGH (ref 0.44–1.00)
GFR, Estimated: 40 mL/min — ABNORMAL LOW (ref >60)
Glucose, Bld: 88 mg/dL (ref 70–99)
Potassium: 4 mmol/L (ref 3.5–5.1)
Sodium: 143 mmol/L (ref 135–145)

## 2021-07-04 LAB — CBC
HCT: 27.1 % — ABNORMAL LOW (ref 36.0–46.0)
Hemoglobin: 8.4 g/dL — ABNORMAL LOW (ref 12.0–15.0)
MCH: 29.9 pg (ref 26.0–34.0)
MCHC: 31 g/dL (ref 30.0–36.0)
MCV: 96.4 fL (ref 80.0–100.0)
Platelets: 363 10*3/uL (ref 150–400)
RBC: 2.81 MIL/uL — ABNORMAL LOW (ref 3.87–5.11)
RDW: 15.1 % (ref 11.5–15.5)
WBC: 5.4 10*3/uL (ref 4.0–10.5)
nRBC: 0 % (ref 0.0–0.2)

## 2021-07-04 LAB — IRON AND TIBC
Iron: 15 ug/dL — ABNORMAL LOW (ref 28–170)
Saturation Ratios: 13 % (ref 10.4–31.8)
TIBC: 120 ug/dL — ABNORMAL LOW (ref 250–450)
UIBC: 105 ug/dL

## 2021-07-04 LAB — FERRITIN: Ferritin: 375 ng/mL — ABNORMAL HIGH (ref 11–307)

## 2021-07-04 LAB — MAGNESIUM: Magnesium: 1.9 mg/dL (ref 1.7–2.4)

## 2021-07-04 MED ORDER — MIDAZOLAM HCL 2 MG/2ML IJ SOLN
INTRAMUSCULAR | Status: AC
  Filled 2021-07-04: qty 2

## 2021-07-04 MED ORDER — LACTATED RINGERS IV SOLN: INTRAVENOUS | Status: AC

## 2021-07-04 MED ORDER — MIRTAZAPINE 15 MG PO TABS
30.0000 mg | ORAL_TABLET | Freq: Every day | ORAL | Status: DC
  Administered 2021-07-04 – 2021-07-08 (×5): 30 mg via ORAL
  Filled 2021-07-04 (×5): qty 2

## 2021-07-04 MED ORDER — SUCCINYLCHOLINE CHLORIDE 200 MG/10ML IV SOSY
PREFILLED_SYRINGE | INTRAVENOUS | Status: AC
  Filled 2021-07-04: qty 10

## 2021-07-04 MED ORDER — SODIUM CHLORIDE 0.9 % IV SOLN: INTRAVENOUS | Status: DC | PRN

## 2021-07-04 MED ORDER — SUCCINYLCHOLINE CHLORIDE 200 MG/10ML IV SOSY
PREFILLED_SYRINGE | INTRAVENOUS | Status: DC | PRN
  Administered 2021-07-04: 90 mg via INTRAVENOUS

## 2021-07-04 MED ORDER — MIDAZOLAM HCL 2 MG/2ML IJ SOLN
INTRAMUSCULAR | Status: DC | PRN
  Administered 2021-07-04: 2 mg via INTRAVENOUS

## 2021-07-04 MED ORDER — MEGESTROL ACETATE 400 MG/10ML PO SUSP
400.0000 mg | Freq: Every day | ORAL | Status: DC
  Administered 2021-07-04 – 2021-07-12 (×4): 400 mg via ORAL
  Filled 2021-07-04 (×11): qty 10

## 2021-07-04 MED ORDER — OLANZAPINE 5 MG PO TBDP
5.0000 mg | ORAL_TABLET | Freq: Every day | ORAL | Status: DC
  Administered 2021-07-04 – 2021-07-16 (×13): 5 mg via ORAL
  Filled 2021-07-04 (×13): qty 1

## 2021-07-04 MED ORDER — METHOHEXITAL SODIUM 100 MG/10ML IV SOSY
PREFILLED_SYRINGE | INTRAVENOUS | Status: DC | PRN
  Administered 2021-07-04: 70 mg via INTRAVENOUS

## 2021-07-04 NOTE — Anesthesia Preprocedure Evaluation (Signed)
Anesthesia Evaluation  Patient identified by MRN, date of birth, ID band Patient awake    Reviewed: Allergy & Precautions, NPO status , Patient's Chart, lab work & pertinent test results  History of Anesthesia Complications Negative for: history of anesthetic complications  Airway Mallampati: III  TM Distance: >3 FB Neck ROM: full  Mouth opening: Limited Mouth Opening  Dental  (+) Chipped, Missing, Poor Dentition   Pulmonary asthma , former smoker,     + decreased breath sounds      Cardiovascular hypertension, Pt. on medications negative cardio ROS Normal cardiovascular exam     Neuro/Psych PSYCHIATRIC DISORDERS Anxiety Depression negative neurological ROS  negative psych ROS   GI/Hepatic Neg liver ROS, GERD  Controlled,  Endo/Other  Hyperthyroidism   Renal/GU Renal disease  negative genitourinary   Musculoskeletal negative musculoskeletal ROS (+)   Abdominal Normal abdominal exam  (+)   Peds negative pediatric ROS (+)  Hematology negative hematology ROS (+) Blood dyscrasia, anemia ,   Anesthesia Other Findings Past Medical History: No date: Anxiety No date: Hypertension   Reproductive/Obstetrics negative OB ROS                             Anesthesia Physical  Anesthesia Plan  ASA: 3  Anesthesia Plan: General   Post-op Pain Management:    Induction: Intravenous  PONV Risk Score and Plan: Propofol infusion and TIVA  Airway Management Planned: Natural Airway and Mask  Additional Equipment:   Intra-op Plan:   Post-operative Plan:   Informed Consent: I have reviewed the patients History and Physical, chart, labs and discussed the procedure including the risks, benefits and alternatives for the proposed anesthesia with the patient or authorized representative who has indicated his/her understanding and acceptance.   Patient has DNR.  Suspend DNR, Discussed DNR with  patient and Discussed DNR with power of attorney.   Dental Advisory Given  Plan Discussed with: CRNA and Surgeon  Anesthesia Plan Comments: (Patient consented for risks of anesthesia including but not limited to:  - adverse reactions to medications - risk of airway placement if required - damage to eyes, teeth, lips or other oral mucosa - nerve damage due to positioning  - sore throat or hoarseness - Damage to heart, brain, nerves, lungs, other parts of body or loss of life  Patient voiced understanding.)        Anesthesia Quick Evaluation

## 2021-07-04 NOTE — Procedures (Signed)
ECT SERVICES Physicians Interval Evaluation & Treatment Note  Patient Identification: Amber [name] MRN:  [id number] Date of Evaluation:  07/04/2021 TX #: [id number]  MADRS:   MMSE:   P.E. Findings:  No change to physical exam except that she seems to have more relaxed control of her movement  Psychiatric Interval Note:  Patient seems more conversant and has a more full affect but continues to endorse negativity  Subjective:  Patient is a 76 y.o. female seen for evaluation for Electroconvulsive Therapy. Continues to endorse hopelessness and the belief that she is unable to eat  Treatment Summary:   []   Right Unilateral             [x]  Bilateral   % Energy : 1.0 ms 60%   Impedance: 2420 ohms  Seizure Energy Index: 4188 V squared  Postictal Suppression Index: No reading  Seizure Concordance Index: 26%  Medications  Pre Shock: Robinul 0.1 mg Brevital 70 mg succinylcholine 90 mg  Post Shock: Versed 2 mg  Seizure Duration: 31 seconds EMG 56 seconds EEG   Comments: Seems to be showing improvement.  Patient strongly encouraged to be eating as best she can.  In addition I am changing her psychiatric medicines by increasing mirtazapine to 30 mg and adding 5 mg of Zyprexa for psychotic depression  Lungs:  [x]   Clear to auscultation               []  Other:   Heart:    [x]   Regular rhythm             []  irregular rhythm    [x]   Previous H&P reviewed, patient examined and there are NO CHANGES                 []   Previous H&P reviewed, patient examined and there are changes noted.   Amber Berthold, MD 2/8/20234:40 PM

## 2021-07-04 NOTE — Progress Notes (Signed)
Nutrition Follow-up  DOCUMENTATION CODES:   Severe malnutrition in context of chronic illness  INTERVENTION:   -Continue MVI with minerals daily -Continue 30 ml Prosource Plus TID, each supplement provides 100 kcals and 15 grams protein -Continue Hormel Shake TID with meals, each supplement provides 520 kcals and 22 grams protein -Continue feeding assistance with meals  NUTRITION DIAGNOSIS:   Severe Malnutrition related to chronic illness (major depressive disorder) as evidenced by percent weight loss, severe fat depletion, severe muscle depletion.  Ongoing  GOAL:   Patient will meet greater than or equal to 90% of their needs  Progressing   MONITOR:   PO intake, Supplement acceptance, Labs, Weight trends, Skin, I & O's  REASON FOR ASSESSMENT:   Consult Enteral/tube feeding initiation and management  ASSESSMENT:   76 yo female with a PMH of HTN, CKD stage 3b, vitamin D deficiency 2/2 CKD, secondary hyperparathyroidism admitted with hypernatremia 2/2 FTT in the setting of depression/MDD. Transferred [facility] to [facility] for consideration of ECT.  1/31- NGT removed 2/1- advanced to regular diet 2/8- megace started by MD  Reviewed I/O's: -1.2 L x 24 hours and +3.3 L since admission  UOP: 1.2 L x 24 hours   Pt unavailable at time of visit. Attempted to speak with pt via call to hospital room phone, however, unable to reach.   Pt remains with poor oral intake. She is currently NPO for ECT today. Pt is on a regular diet with meal completions 25-50%. Appetite stimulant started today. Pt remains with variable acceptance of nutritional supplements.   Medications reviewed and include vitamin B-12, haldol, megace, remeron, thiamine, vitamin D, and lactated ringers infusion @ 100 ml/hr.   Labs reviewed: CBGS: 79-169 (inpatient orders for glycemic control are none).    Diet Order:   Diet Order             Diet NPO time specified  Diet effective midnight                    EDUCATION NEEDS:   Not appropriate for education at this time  Skin:  Skin Assessment: Reviewed RN Assessment  Last BM:  06/28/21  Height:   Ht Readings from Last 1 Encounters:  06/20/21 5\' 6"  (1.676 m)    Weight:   Wt Readings from Last 1 Encounters:  06/30/21 57.4 kg   BMI:  Body mass index is 20.42 kg/m.  Estimated Nutritional Needs:   Kcal:  3785-8850  Protein:  85-100 grams  Fluid:  >1.75 L    Loistine Chance, RD, LDN, [place] Registered Dietitian II Certified Diabetes Care and Education Specialist Please refer to [facility] for RD and/or RD on-call/weekend/after hours pager

## 2021-07-04 NOTE — Progress Notes (Signed)
Patient upright, eating supper and drinking sweet tea.

## 2021-07-04 NOTE — Transfer of Care (Signed)
Immediate Anesthesia Transfer of Care Note  Patient: Amber [name]  Procedure(s) Performed: ECT TX  Patient Location: PACU  Anesthesia Type:General  Level of Consciousness: drowsy  Airway & Oxygen Therapy: Patient Spontanous Breathing  Post-op Assessment: Report given to RN and Post -op Vital signs reviewed and stable  Post vital signs: Reviewed and stable  Last Vitals:  Vitals Value Taken Time  BP 120/70 07/04/21 1334  Temp    Pulse 107 07/04/21 1334  Resp 24 07/04/21 1334  SpO2 98 % 07/04/21 1334    Last Pain:  Vitals:   07/04/21 1312  TempSrc: Tympanic  PainSc: 0-No pain         Complications: No notable events documented.

## 2021-07-04 NOTE — Progress Notes (Signed)
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 Weber Cooks of psychiatry.     She was admitted to [facility] from 06/08/21-06/19/21 after presenting with failure to thrive following a several week history of complaints of difficulty swallowing with decreased oral intake and subsequent development of confusion and hallucinations.  She had outpatient work-up include brain MRI as well as MRV which was unremarkable.  On admission she is found to have an AKI and several electrolyte abnormalities.  While at [facility] she refused to eat and refused further evaluation with esophagram to evaluate her sensation of dysphagia.  She is evaluated by psychiatry who determined her to not have capacity for medical decision-making and so an NG tube was placed for force-feeding.  Palliative care consult was also done.  Due to her poor response to oral antidepressants ECG was recommended and thus arranged to transfer to [facility].  On 06/08/2020 she tested positive for COVID and she received 3 days of remdesivir.  Most of her nutritional deficiencies related to inadequate oral intake were corrected prior to transfer and on the day of discharge she developed acute urinary retention a Foley catheter was placed and it was traumatic insertion with gross development of gross hematuria.  Hemoglobin did drop to 7.0    On the afternoon of 06/21/21 she spiked a temperature of 101.9 and became tachycardic and slightly hypotensive.  She is given IV fluid bolus and she is pancultured.  Further work-up revealed that she had a right lower lobe pneumonia so she was started  on IV Unasyn and completed a course.    Given that she continues to have some mild loose stools ID felt that since the patient was positive toxin producing C. difficile empirically treat with vancomycin for 10 days.  Foley catheter has been removed.  she appeared to have a positive response to ECT.  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)   At high risk for inadequate nutritional intake   Gross hematuria   Fever   Aspiration pneumonia of right lung due to gastric secretions (HCC)  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  Severe protein calorie malnutrition  Anorexia. Patient initially had a Dobbhoff until 1/31 when she pulled out. She still has a poor appetite, will start megestrol. Continue protein supplement.  Aspiration pneumonia  Sepsis secondary to aspiration pneumonia Patient has completed antibiotics.  Acute blood loss anemia Vitamin B12 deficient anemia. Gross hematuria secondary to Foley catheter trauma. Thrombocytopenia. Reviewed previous labs, patient had a B12 deficiency.  Currently on B12 orally Iron level low, ferritin level elevated. Patient no longer has any hematuria.  Continue to follow-up. Places member has  normalized.  Urinary retention. Continue Flomax, condition has resolved.  Chronic DVT and new PE. Continue Eliquis.  Recent COVID-19 infection. Condition had improved.  Acute kidney injury. Metabolic acidosis. Chronic kidney disease ruled out. Patient renal function was normal  prior to admission.  Renal function slightly worsened today.  She appeared to be dehydrated, will give her fluids. Patient does not have hypocalcemia, no PTH was checked, therefore, patient does not have evidence of primary hyperparathyroidism.      DVT prophylaxis: Eliquis Code Status: DNR Family Communication:  Disposition Plan:    Status is: Inpatient Remains inpatient appropriate because: Severity of disease, ECT treatment, IV fluids.            I/O last 3 completed shifts: In: 0  Out: 1400 [Urine:1400] No intake/output data recorded.     Consultants:  Psychiatry.  Procedures: ECT  Antimicrobials: None  Subjective: Patient has some confusion, has a very poor appetite, but no nausea vomiting abdominal pain. No short of breath or cough.  Objective: Vitals:   07/04/21 0105 07/04/21 0540 07/04/21 0728 07/04/21 1127  BP: 138/75 137/78 119/72 121/89  Pulse: (!) 107 (!) 104 100 (!) 106  Resp: 18 18 17 16   Temp:   99.1 F (37.3 C) 98.9 F (37.2 C)  TempSrc:    Oral  SpO2:   97% 100%  Weight:      Height:        Intake/Output Summary (Last 24 hours) at 07/04/2021 1324 Last data filed at 07/04/2021 1324 Gross per 24 hour  Intake 0 ml  Output 1000 ml  Net -1000 ml   Filed Weights   06/28/21 0500 06/29/21 0530 06/30/21 0500  Weight: 69.5 kg 57.4 kg 57.4 kg    Examination:  General exam: Appears calm and comfortable  Respiratory system: Clear to auscultation. Respiratory effort normal. Cardiovascular system: S1 & S2 heard, RRR. No JVD, murmurs, rubs, gallops or clicks. No pedal edema. Gastrointestinal system: Abdomen is nondistended, soft and nontender. No organomegaly or masses felt. Normal bowel sounds heard. Central nervous system: Alert and oriented x2. No focal neurological deficits. Extremities: Symmetric 5 x 5 power. Skin: No rashes, lesions or ulcers    Data Reviewed: I have personally reviewed following labs and imaging  studies  CBC: Recent Labs  Lab 06/29/21 0439 07/02/21 0518 07/04/21 0434  WBC 6.2 5.7 5.4  HGB 7.9* 8.2* 8.4*  HCT 24.8* 26.3* 27.1*  MCV 95.4 93.6 96.4  PLT 333 410* 401   Basic Metabolic Panel: Recent Labs  Lab 06/29/21 0439 07/02/21 0518 07/04/21 0434  NA 139 143 143  K 3.6 3.3* 4.0  CL 114* 116* 117*  CO2 22 23 24   GLUCOSE 78 87 88  BUN 11 21 21   CREATININE 1.18* 1.34* 1.38*  CALCIUM 9.3 9.9 9.9  MG 1.9 2.1 1.9  PHOS 3.0  --   --    GFR: Estimated Creatinine Clearance: 32.9 mL/min (A) (by C-G formula based on SCr of 1.38 mg/dL (H)). Liver Function Tests: No results for input(s): AST, ALT, ALKPHOS, BILITOT, PROT, ALBUMIN in the last 168 hours. No results for input(s): LIPASE, AMYLASE in the last 168 hours. No results for input(s): AMMONIA in the last 168 hours. Coagulation Profile: No results for input(s): INR, PROTIME in the last 168 hours. Cardiac Enzymes: No results for input(s): CKTOTAL, CKMB, CKMBINDEX, TROPONINI in the last 168 hours. BNP (last 3 results) No results for input(s): PROBNP in the last 8760 hours. HbA1C: No results for input(s): HGBA1C  in the last 72 hours. CBG: Recent Labs  Lab 06/29/21 0814 06/29/21 1144 06/29/21 1546 06/30/21 0802 06/30/21 1217  GLUCAP 96 84 92 169* 79   Lipid Profile: No results for input(s): CHOL, HDL, LDLCALC, TRIG, CHOLHDL, LDLDIRECT in the last 72 hours. Thyroid Function Tests: No results for input(s): TSH, T4TOTAL, FREET4, T3FREE, THYROIDAB in the last 72 hours. Anemia Panel: Recent Labs    07/04/21 0434  FERRITIN 375*  TIBC 120*  IRON 15*   Sepsis Labs: No results for input(s): PROCALCITON, LATICACIDVEN in the last 168 hours.  No results found for this or any previous visit (from the past 240 hour(s)).       Radiology Studies: No results found.      Scheduled Meds:  (feeding supplement) PROSource Plus  30 mL Oral TID BM   sodium chloride   Intravenous Once   apixaban  5 mg Oral BID    Chlorhexidine Gluconate Cloth  6 each Topical Daily   cyanocobalamin  1,000 mcg Intramuscular Daily   diphenhydrAMINE  50 mg Intravenous Once   haloperidol lactate  0.5 mg Intravenous QHS   megestrol  400 mg Oral Daily   mirtazapine  15 mg Oral QHS   multivitamin with minerals  1 tablet Oral Daily   tamsulosin  0.4 mg Oral QPC supper   thiamine  100 mg Oral Daily   Vitamin D (Ergocalciferol)  50,000 Units Oral Q7 days   Continuous Infusions:   LOS: 15 days    Time spent: 35 minutes    Sharen Hones, MD [facility]   To contact the attending provider between 7A-7P or the covering provider during after hours 7P-7A, please log into the web site [email or web address] and access using universal [place] password for that web site. If you do not have the password, please call the hospital operator.  07/04/2021, 1:24 PM

## 2021-07-04 NOTE — H&P (Signed)
Amber [name] is an 76 y.o. female.   Chief Complaint: Follow-up 76 year old woman with severe depression with psychotic features.  Patient is unable to identify any changes but outwardly she seems much improved in terms of her behavior.  She still complains that she "cannot eat" HPI: History of recurrent depression with this episode particularly bad  Past Medical History:  Diagnosis Date   Anxiety    Hypertension     History reviewed. No pertinent surgical history.  Family History  Problem Relation Age of Onset   High blood pressure Maternal Grandmother    Social History:  reports that she has quit smoking. Her smoking use included cigarettes. She has never used smokeless tobacco. She reports that she does not currently use alcohol. She reports that she does not currently use drugs.  Allergies:  Allergies  Allergen Reactions   Shellfish Allergy     "I pass out"    (Not in a hospital admission)   Results for orders placed or performed during the hospital encounter of 06/19/21 (from the past 48 hour(s))  Basic metabolic panel     Status: Abnormal   Collection Time: 07/04/21  4:34 AM  Result Value Ref Range   Sodium 143 135 - 145 mmol/L    Comment: ELECTROLYTES REPEATED TO VERIFY RH   Potassium 4.0 3.5 - 5.1 mmol/L   Chloride 117 (H) 98 - 111 mmol/L   CO2 24 22 - 32 mmol/L   Glucose, Bld 88 70 - 99 mg/dL    Comment: Glucose reference range applies only to samples taken after fasting for at least 8 hours.   BUN 21 8 - 23 mg/dL   Creatinine, Ser 1.38 (H) 0.44 - 1.00 mg/dL   Calcium 9.9 8.9 - 10.3 mg/dL   GFR, Estimated 40 (L) >60 mL/min    Comment: (NOTE) Calculated using the CKD-EPI Creatinine Equation (2021)    Anion gap 2 (L) 5 - 15    Comment: Performed at [facility], [address] [place], [place] [address]  CBC     Status: Abnormal   Collection Time: 07/04/21  4:34 AM  Result Value Ref Range   WBC 5.4 4.0 - 10.5 K/uL   RBC 2.81 (L) 3.87 - 5.11  MIL/uL   Hemoglobin 8.4 (L) 12.0 - 15.0 g/dL   HCT 27.1 (L) 36.0 - 46.0 %   MCV 96.4 80.0 - 100.0 fL   MCH 29.9 26.0 - 34.0 pg   MCHC 31.0 30.0 - 36.0 g/dL   RDW 15.1 11.5 - 15.5 %   Platelets 363 150 - 400 K/uL   nRBC 0.0 0.0 - 0.2 %    Comment: Performed at [facility], [address] [place], [place] [address]  Magnesium     Status: None   Collection Time: 07/04/21  4:34 AM  Result Value Ref Range   Magnesium 1.9 1.7 - 2.4 mg/dL    Comment: Performed at [facility], [address] [place], [place] [address]  Iron and TIBC     Status: Abnormal   Collection Time: 07/04/21  4:34 AM  Result Value Ref Range   Iron 15 (L) 28 - 170 ug/dL   TIBC 120 (L) 250 - 450 ug/dL   Saturation Ratios 13 10.4 - 31.8 %   UIBC 105 ug/dL    Comment: Performed at [facility], [address], [place], [place] [address]  Ferritin     Status: Abnormal   Collection Time: 07/04/21  4:34 AM  Result Value Ref Range   Ferritin 375 (H) 11 - 307 ng/mL    Comment: Performed at [facility], [address] [place], [place] [address]   No results found.  Review of Systems  Constitutional: Negative.   HENT: Negative.    Eyes: Negative.   Respiratory: Negative.    Cardiovascular: Negative.   Gastrointestinal: Negative.   Musculoskeletal: Negative.   Skin: Negative.   Neurological: Negative.   Psychiatric/Behavioral:  Positive for confusion, decreased concentration and dysphoric mood. The patient is nervous/anxious.    Blood pressure (!) 118/102, pulse (!) 114, temperature 98.5 F (36.9 C), resp. rate 20, height 5\' 6"  (1.676 m), weight 57.4 kg, SpO2 96 %. Physical Exam Vitals and nursing note reviewed.  Constitutional:      Appearance: She is well-developed.  HENT:     Head: Normocephalic and atraumatic.  Eyes:     Conjunctiva/sclera: Conjunctivae normal.     Pupils: Pupils are equal, round, and reactive to light.  Cardiovascular:     Heart sounds:  Normal heart sounds.  Pulmonary:     Effort: Pulmonary effort is normal.  Abdominal:     Palpations: Abdomen is soft.  Musculoskeletal:        General: Normal range of motion.     Cervical back: Normal range of motion.  Skin:    General: Skin is warm and dry.  Neurological:     General: No focal deficit present.     Mental Status: She is alert.  Psychiatric:        Attention and Perception: She is inattentive.        Mood and Affect: Mood is anxious.     Assessment/Plan Patient seems to be getting some improvement and we recommend continuing with the index course of ECT  Alethia Berthold, MD 07/04/2021, 4:34 PM

## 2021-07-04 NOTE — Anesthesia Postprocedure Evaluation (Signed)
Anesthesia Post Note  Patient: Amber [name]  Procedure(s) Performed: ECT TX  Patient location during evaluation: PACU Anesthesia Type: General Level of consciousness: awake and alert Pain management: pain level controlled Vital Signs Assessment: post-procedure vital signs reviewed and stable Respiratory status: spontaneous breathing, nonlabored ventilation, respiratory function stable and patient connected to nasal cannula oxygen Cardiovascular status: blood pressure returned to baseline and stable Postop Assessment: no apparent nausea or vomiting Anesthetic complications: no   No notable events documented.   Last Vitals:  Vitals:   07/04/21 1345 07/04/21 1350  BP: 127/81 127/85  Pulse: (!) 117 (!) 110  Resp: 16 (!) 22  Temp:    SpO2: 100% 100%    Last Pain:  Vitals:   07/04/21 1330  TempSrc:   PainSc: [place]

## 2021-07-05 ENCOUNTER — Other Ambulatory Visit: Payer: Self-pay | Admitting: Psychiatry

## 2021-07-05 NOTE — Progress Notes (Signed)
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 Weber Cooks of psychiatry.     She was admitted to [facility] from 06/08/21-06/19/21 after presenting with failure to thrive following a several week history of complaints of difficulty swallowing with decreased oral intake and subsequent development of confusion and hallucinations.  She had outpatient work-up include brain MRI as well as MRV which was unremarkable.  On admission she is found to have an AKI and several electrolyte abnormalities.  While at [facility] she refused to eat and refused further evaluation with esophagram to evaluate her sensation of dysphagia.  She is evaluated by psychiatry who determined her to not have capacity for medical decision-making and so an NG tube was placed for force-feeding.  Palliative care consult was also done.  Due to her poor response to oral antidepressants ECG was recommended and thus arranged to transfer to [facility].  On 06/08/2020 she tested positive for COVID and she received 3 days of remdesivir.  Most of her nutritional deficiencies related to inadequate oral intake were corrected prior to transfer and on the day of discharge she developed acute urinary retention a Foley catheter was placed and it was traumatic insertion with gross development of gross hematuria.  Hemoglobin did drop to 7.0    On the afternoon of 06/21/21 she spiked a temperature of 101.9 and became tachycardic and slightly hypotensive.  She is given IV fluid bolus and she is pancultured.  Further work-up revealed that she had a right lower lobe pneumonia so she was started on  IV Unasyn and completed a course.    Given that she continues to have some mild loose stools ID felt that since the patient was positive toxin producing C. difficile empirically treat with vancomycin for 10 days.  Foley catheter has been removed.  she appeared to have a positive response to ECT.   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 Patient appears to be improving, she is more alert and oriented today.   Severe protein calorie malnutrition  Anorexia. Patient initially had a Dobbhoff until 1/31 when she pulled out. Patient still has a poor appetite, will continue megestrol and protein supplements.  Aspiration pneumonia  Sepsis secondary to aspiration pneumonia Patient has completed antibiotics.   Acute blood loss anemia Vitamin B12 deficient anemia. Gross hematuria secondary to Foley catheter trauma. Thrombocytopenia. Reviewed previous labs, patient had a B12 deficiency.  Currently on B12 orally Iron level low, ferritin level  elevated. Patient no longer has any hematuria.  Platelets has normalized, hemoglobin stable.  Urinary retention. Patient still has some residual, will continue to follow.  Continue Flomax.  Chronic DVT and new PE. Continue Eliquis.  Recent COVID-19 infection. Condition had improved.  Acute  kidney injury. Metabolic acidosis. Hypokalemia.  Improved. Chronic kidney disease ruled out. Received IV fluid yesterday, renal function still the same, relatively stable.  We will continue to follow.    DVT prophylaxis: Eliquis Code Status: DNR Family Communication:  Disposition Plan:      Status is: Inpatient Remains inpatient appropriate because: Severity of disease, ECT treatment, IV fluids.        I/O last 3 completed shifts: In: 244.3 [P.O.:120; I.V.:124.3] Out: 600 [Urine:600] No intake/output data recorded.     Consultants:  Psychiatry.  Procedures: ECT.  Antimicrobials: None  Subjective: Patient started eating, she was only eat a small amount of food. Short of breath or cough. No abdominal pain or nausea vomiting. She is less confused today, she also is more awake.  She states that she normally is bedbound.  Objective: Vitals:   07/04/21 1604 07/04/21 2008 07/05/21 0555 07/05/21 0736  BP: 130/66 132/81 126/86 129/73  Pulse: (!) 106 (!) 108 (!) 103 97  Resp: 16 20 20 16   Temp: 98.7 F (37.1 C) 97.6 F (36.4 C) 98.4 F (36.9 C) 98.2 F (36.8 C)  TempSrc:  Oral Oral Oral  SpO2: 100% 100% 99% 100%  Weight:      Height:        Intake/Output Summary (Last 24 hours) at 07/05/2021 0950 Last data filed at 07/04/2021 2141 Gross per 24 hour  Intake 244.25 ml  Output --  Net 244.25 ml   Filed Weights   06/28/21 0500 06/29/21 0530 06/30/21 0500  Weight: 69.5 kg 57.4 kg 57.4 kg    Examination:  General exam: Appears calm and comfortable  Respiratory system: Clear to auscultation. Respiratory effort normal. Cardiovascular system: S1 & S2 heard, RRR. No JVD, murmurs, rubs, gallops or clicks. No pedal edema. Gastrointestinal system: Abdomen is nondistended, soft and nontender. No organomegaly or masses felt. Normal bowel sounds heard. Central nervous system: Alert and oriented x2. No focal neurological deficits. Extremities: Symmetric 5 x 5  power. Skin: No rashes, lesions or ulcers Psychiatry: Judgement and insight appear normal. Mood & affect appropriate.     Data Reviewed: I have personally reviewed following labs and imaging studies  CBC: Recent Labs  Lab 06/29/21 0439 07/02/21 0518 07/04/21 0434  WBC 6.2 5.7 5.4  HGB 7.9* 8.2* 8.4*  HCT 24.8* 26.3* 27.1*  MCV 95.4 93.6 96.4  PLT 333 410* 008   Basic Metabolic Panel: Recent Labs  Lab 06/29/21 0439 07/02/21 0518 07/04/21 0434  NA 139 143 143  K 3.6 3.3* 4.0  CL 114* 116* 117*  CO2 22 23 24   GLUCOSE 78 87 88  BUN 11 21 21   CREATININE 1.18* 1.34* 1.38*  CALCIUM 9.3 9.9 9.9  MG 1.9 2.1 1.9  PHOS 3.0  --   --    GFR: Estimated Creatinine Clearance: 32.9 mL/min (A) (by C-G formula based on SCr of 1.38 mg/dL (H)). Liver Function Tests: No results for input(s): AST, ALT, ALKPHOS, BILITOT, PROT, ALBUMIN in the last 168 hours. No results for input(s): LIPASE, AMYLASE in the last 168 hours. No results for input(s): AMMONIA in the last 168 hours. Coagulation Profile: No results for input(s): INR, PROTIME in the last 168 hours. Cardiac Enzymes: No results for  input(s): CKTOTAL, CKMB, CKMBINDEX, TROPONINI in the last 168 hours. BNP (last 3 results) No results for input(s): PROBNP in the last 8760 hours. HbA1C: No results for input(s): HGBA1C in the last 72 hours. CBG: Recent Labs  Lab 06/29/21 0814 06/29/21 1144 06/29/21 1546 06/30/21 0802 06/30/21 1217  GLUCAP 96 84 92 169* 79   Lipid Profile: No results for input(s): CHOL, HDL, LDLCALC, TRIG, CHOLHDL, LDLDIRECT in the last 72 hours. Thyroid Function Tests: No results for input(s): TSH, T4TOTAL, FREET4, T3FREE, THYROIDAB in the last 72 hours. Anemia Panel: Recent Labs    07/04/21 0434  FERRITIN 375*  TIBC 120*  IRON 15*   Sepsis Labs: No results for input(s): PROCALCITON, LATICACIDVEN in the last 168 hours.  No results found for this or any previous visit (from the past 240 hour(s)).        Radiology Studies: No results found.      Scheduled Meds:  (feeding supplement) PROSource Plus  30 mL Oral TID BM   sodium chloride   Intravenous Once   apixaban  5 mg Oral BID   Chlorhexidine Gluconate Cloth  6 each Topical Daily   cyanocobalamin  1,000 mcg Intramuscular Daily   diphenhydrAMINE  50 mg Intravenous Once   haloperidol lactate  0.5 mg Intravenous QHS   megestrol  400 mg Oral Daily   mirtazapine  30 mg Oral QHS   multivitamin with minerals  1 tablet Oral Daily   OLANZapine zydis  5 mg Oral QHS   tamsulosin  0.4 mg Oral QPC supper   thiamine  100 mg Oral Daily   Vitamin D (Ergocalciferol)  50,000 Units Oral Q7 days   Continuous Infusions:   LOS: 16 days    Time spent: 27 minutes    Sharen Hones, MD [facility]   To contact the attending provider between 7A-7P or the covering provider during after hours 7P-7A, please log into the web site [email or web address] and access using universal [place] password for that web site. If you do not have the password, please call the hospital operator.  07/05/2021, 9:50 AM

## 2021-07-06 ENCOUNTER — Inpatient Hospital Stay: Payer: Medicare Other | Admitting: Anesthesiology

## 2021-07-06 ENCOUNTER — Inpatient Hospital Stay (HOSPITAL_COMMUNITY)
Admission: AD | Admit: 2021-07-06 | Discharge: 2021-07-06 | Disposition: A | Discharge status: Home / Self Care | Payer: Medicare Other | Source: Other Acute Inpatient Hospital | Attending: Internal Medicine | Admitting: Internal Medicine

## 2021-07-06 DIAGNOSIS — F332 Major depressive disorder, recurrent severe without psychotic features: Secondary | ICD-10-CM | POA: Diagnosis not present

## 2021-07-06 LAB — CBC
HCT: 25.9 % — ABNORMAL LOW (ref 36.0–46.0)
Hemoglobin: 8.2 g/dL — ABNORMAL LOW (ref 12.0–15.0)
MCH: 30 pg (ref 26.0–34.0)
MCHC: 31.7 g/dL (ref 30.0–36.0)
MCV: 94.9 fL (ref 80.0–100.0)
Platelets: 290 10*3/uL (ref 150–400)
RBC: 2.73 MIL/uL — ABNORMAL LOW (ref 3.87–5.11)
RDW: 15.3 % (ref 11.5–15.5)
WBC: 4.6 10*3/uL (ref 4.0–10.5)
nRBC: 0 % (ref 0.0–0.2)

## 2021-07-06 LAB — BASIC METABOLIC PANEL
Anion gap: 5 (ref 5–15)
BUN: 24 mg/dL — ABNORMAL HIGH (ref 8–23)
CO2: 24 mmol/L (ref 22–32)
Calcium: 10 mg/dL (ref 8.9–10.3)
Chloride: 114 mmol/L — ABNORMAL HIGH (ref 98–111)
Creatinine, Ser: 1.56 mg/dL — ABNORMAL HIGH (ref 0.44–1.00)
GFR, Estimated: 35 mL/min — ABNORMAL LOW (ref >60)
Glucose, Bld: 92 mg/dL (ref 70–99)
Potassium: 3.2 mmol/L — ABNORMAL LOW (ref 3.5–5.1)
Sodium: 143 mmol/L (ref 135–145)

## 2021-07-06 LAB — MAGNESIUM: Magnesium: 2.2 mg/dL (ref 1.7–2.4)

## 2021-07-06 MED ORDER — SUCCINYLCHOLINE CHLORIDE 200 MG/10ML IV SOSY
PREFILLED_SYRINGE | INTRAVENOUS | Status: AC
  Filled 2021-07-06: qty 10

## 2021-07-06 MED ORDER — POTASSIUM CHLORIDE 10 MEQ/100ML IV SOLN
10.0000 meq | INTRAVENOUS | Status: AC
  Administered 2021-07-06 (×3): 10 meq via INTRAVENOUS
  Filled 2021-07-06 (×3): qty 100

## 2021-07-06 MED ORDER — MIDAZOLAM HCL 2 MG/2ML IJ SOLN
INTRAMUSCULAR | Status: AC
  Filled 2021-07-06: qty 2

## 2021-07-06 MED ORDER — MIDAZOLAM HCL 2 MG/2ML IJ SOLN
INTRAMUSCULAR | Status: DC | PRN
Start: 2021-07-06 — End: 2021-07-06
  Administered 2021-07-06: 2 mg via INTRAVENOUS

## 2021-07-06 MED ORDER — METHOHEXITAL SODIUM 0.5 G IJ SOLR
INTRAMUSCULAR | Status: AC
  Filled 2021-07-06: qty 500

## 2021-07-06 MED ORDER — METHOHEXITAL SODIUM 100 MG/10ML IV SOSY
PREFILLED_SYRINGE | INTRAVENOUS | Status: DC | PRN
Start: 2021-07-06 — End: 2021-07-06
  Administered 2021-07-06: 70 mg via INTRAVENOUS

## 2021-07-06 MED ORDER — SUCCINYLCHOLINE CHLORIDE 200 MG/10ML IV SOSY
PREFILLED_SYRINGE | INTRAVENOUS | Status: DC | PRN
Start: 2021-07-06 — End: 2021-07-06
  Administered 2021-07-06: 90 mg via INTRAVENOUS

## 2021-07-06 NOTE — Care Management Important Message (Signed)
Important Message  Patient Details  Name: Amber [name] MRN: [id number] Date of Birth: November 18, 1945   Medicare Important Message Given:  Yes  Patient is in an isolation room so I reviewed the Important Message from Medicare with her over the phone [phone]). She stated she understood her rights. I asked if she would like a copy and she replied yes.  I let her know I would bring a copy to the unit and ask a team member to bring it into the room.  I wished her a speedy recovery and thanked her for her time.   Amber [name] [name] 07/06/2021, 12:48 PM

## 2021-07-06 NOTE — Progress Notes (Signed)
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 [place] Cooks of psychiatry.     She was admitted to [facility] from 06/08/21-06/19/21 after presenting with failure to thrive following a several week history of complaints of difficulty swallowing with decreased oral intake and subsequent development of confusion and hallucinations.  She had outpatient work-up include brain MRI as well as MRV which was unremarkable.  On admission she is found to have an AKI and several electrolyte abnormalities.  While at [facility] she refused to eat and refused further evaluation with esophagram to evaluate her sensation of dysphagia.  She is evaluated by psychiatry who determined her to not have capacity for medical decision-making and so an NG tube was placed for force-feeding.  Palliative care consult was also done.  Due to her poor response to oral antidepressants ECG was recommended and thus arranged to transfer to [facility].  On 06/08/2020 she tested positive for COVID and she received 3 days of remdesivir.  Most of her nutritional deficiencies related to inadequate oral intake were corrected prior to transfer and on the day of discharge she developed acute urinary retention a Foley catheter was placed and it was traumatic insertion with gross development of gross hematuria.  Hemoglobin did drop to 7.0    On the afternoon of 06/21/21 she spiked a temperature of 101.9 and became tachycardic and slightly hypotensive.  She is given IV fluid bolus and she is pancultured.  Further work-up revealed that she had a right lower lobe pneumonia so she was started  on IV Unasyn and completed a course.    Given that she continues to have some mild loose stools ID felt that since the patient was positive toxin producing C. difficile empirically treat with vancomycin for 10 days.  Foley catheter has been removed.  she appeared to have a positive response to ECT.  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 Patient continue making progress.  She is alert and oriented to time place and person today.  She will receive another ECT treatment.    Severe protein calorie malnutrition  Anorexia. Patient initially had a Dobbhoff until 1/31 when she pulled out. Condition had improved, she states that she has good appetite today.  Continue megestrol.  Aspiration pneumonia  Sepsis secondary to aspiration pneumonia Patient has completed antibiotics.   Acute blood loss anemia Vitamin B12 deficient anemia. Gross hematuria secondary to Foley catheter trauma. Thrombocytopenia. Reviewed previous labs, patient  had a B12 deficiency.  Currently on B12 orally Iron level low, ferritin level elevated. Patient no longer has any hematuria.  Platelets has normalized, hemoglobin stable.  Urinary retention. Continue as needed bladder scan, I and O catheter if needed.  Avoid the Foley catheter the patient had a recent  hematuria from Foley damage.  Chronic DVT and new PE. Continue Eliquis.  Recent COVID-19 infection. Condition had improved.  Acute kidney injury. Metabolic acidosis. Hypokalemia.  Improved. Chronic kidney disease ruled out. Function relatively stable       DVT prophylaxis: Eliquis Code Status: DNR Family Communication: daughter updated Disposition Plan:      Status is: Inpatient Remains inpatient appropriate because: Severity of disease, ECT treatment, IV fluids.      I/O last 3 completed shifts: In: 120 [P.O.:120] Out: -  Total I/O In: -  Out: 400 [Urine:400]     Consultants:  None  Procedures: None  Antimicrobials: None  Subjective: Patient no longer has any confusion today.  She said that she had a good dinner last night.  Appetite improving. No short of breath or cough.  Objective: Vitals:   07/05/21 2038 07/06/21 0533 07/06/21 0802 07/06/21 1159  BP: 135/86 118/83 (!) 115/59 (!) 162/88  Pulse: (!) 107 99 94 (!) 109  Resp: 16 16 18 19   Temp: 99.6 F (37.6 C) 97.7 F (36.5 C) 98.3 F (36.8 C) (!) 97.5 F (36.4 C)  TempSrc:  Oral Axillary Oral  SpO2: 99% 100% 99% 100%  Weight:      Height:        Intake/Output Summary (Last 24 hours) at 07/06/2021 1329 Last data filed at 07/06/2021 1032 Gross per 24 hour  Intake --  Output 400 ml  Net -400 ml   Filed Weights   06/28/21 0500 06/29/21 0530 06/30/21 0500  Weight: 69.5 kg 57.4 kg 57.4 kg    Examination:  General exam: Appears calm and comfortable  Respiratory system: Clear to auscultation. Respiratory effort normal. Cardiovascular system: S1 & S2 heard, RRR. No JVD, murmurs, rubs, gallops or clicks. No pedal edema. Gastrointestinal system: Abdomen is nondistended, soft and nontender. No organomegaly or masses felt. Normal bowel sounds heard. Central nervous system: Alert and oriented x3. No focal neurological deficits. Extremities: Symmetric 5 x 5 power. Skin: No rashes, lesions or  ulcers Psychiatry: Judgement and insight appear normal. Mood & affect appropriate.     Data Reviewed: I have personally reviewed following labs and imaging studies  CBC: Recent Labs  Lab 07/02/21 0518 07/04/21 0434 07/06/21 0403  WBC 5.7 5.4 4.6  HGB 8.2* 8.4* 8.2*  HCT 26.3* 27.1* 25.9*  MCV 93.6 96.4 94.9  PLT 410* 363 030   Basic Metabolic Panel: Recent Labs  Lab 07/02/21 0518 07/04/21 0434 07/06/21 0403  NA 143 143 143  K 3.3* 4.0 3.2*  CL 116* 117* 114*  CO2 23 24 24   GLUCOSE 87 88 92  BUN 21 21 24*  CREATININE 1.34* 1.38* 1.56*  CALCIUM 9.9 9.9 10.0  MG 2.1 1.9 2.2   GFR: Estimated Creatinine Clearance: 29.1 mL/min (A) (by C-G formula based on SCr of 1.56 mg/dL (H)). Liver Function Tests: No results for input(s): AST, ALT, ALKPHOS, BILITOT, PROT, ALBUMIN in the last 168 hours. No results for input(s): LIPASE, AMYLASE in the last 168 hours. No results for input(s): AMMONIA in the last 168 hours. Coagulation Profile: No results for input(s): INR, PROTIME in the last 168 hours. Cardiac Enzymes: No results for input(s): CKTOTAL, CKMB,  CKMBINDEX, TROPONINI in the last 168 hours. BNP (last 3 results) No results for input(s): PROBNP in the last 8760 hours. HbA1C: No results for input(s): HGBA1C in the last 72 hours. CBG: Recent Labs  Lab 06/29/21 1546 06/30/21 0802 06/30/21 1217  GLUCAP 92 169* 79   Lipid Profile: No results for input(s): CHOL, HDL, LDLCALC, TRIG, CHOLHDL, LDLDIRECT in the last 72 hours. Thyroid Function Tests: No results for input(s): TSH, T4TOTAL, FREET4, T3FREE, THYROIDAB in the last 72 hours. Anemia Panel: Recent Labs    07/04/21 0434  FERRITIN 375*  TIBC 120*  IRON 15*   Sepsis Labs: No results for input(s): PROCALCITON, LATICACIDVEN in the last 168 hours.  No results found for this or any previous visit (from the past 240 hour(s)).       Radiology Studies: No results found.      Scheduled Meds:  (feeding  supplement) PROSource Plus  30 mL Oral TID BM   sodium chloride   Intravenous Once   apixaban  5 mg Oral BID   Chlorhexidine Gluconate Cloth  6 each Topical Daily   cyanocobalamin  1,000 mcg Intramuscular Daily   diphenhydrAMINE  50 mg Intravenous Once   haloperidol lactate  0.5 mg Intravenous QHS   megestrol  400 mg Oral Daily   mirtazapine  30 mg Oral QHS   multivitamin with minerals  1 tablet Oral Daily   OLANZapine zydis  5 mg Oral QHS   tamsulosin  0.4 mg Oral QPC supper   thiamine  100 mg Oral Daily   Vitamin D (Ergocalciferol)  50,000 Units Oral Q7 days   Continuous Infusions:   LOS: 17 days    Time spent: 26 minutes    Sharen Hones, MD [facility]   To contact the attending provider between 7A-7P or the covering provider during after hours 7P-7A, please log into the web site [email or web address] and access using universal [place] password for that web site. If you do not have the password, please call the hospital operator.  07/06/2021, 1:29 PM

## 2021-07-06 NOTE — H&P (Signed)
Amber [name] is an 76 y.o. female.   Chief Complaint: Patient had no specific complaints today.  She was actually asking for more food.  Affect seems a little bit brighter.  Not as negativistic HPI: History of recent severe depression responding to ECT  Past Medical History:  Diagnosis Date   Anxiety    Hypertension     History reviewed. No pertinent surgical history.  Family History  Problem Relation Age of Onset   High blood pressure Maternal Grandmother    Social History:  reports that she has quit smoking. Her smoking use included cigarettes. She has never used smokeless tobacco. She reports that she does not currently use alcohol. She reports that she does not currently use drugs.  Allergies:  Allergies  Allergen Reactions   Shellfish Allergy     "I pass out"    (Not in a hospital admission)   Results for orders placed or performed during the hospital encounter of 06/19/21 (from the past 48 hour(s))  Basic metabolic panel     Status: Abnormal   Collection Time: 07/06/21  4:03 AM  Result Value Ref Range   Sodium 143 135 - 145 mmol/L   Potassium 3.2 (L) 3.5 - 5.1 mmol/L   Chloride 114 (H) 98 - 111 mmol/L   CO2 24 22 - 32 mmol/L   Glucose, Bld 92 70 - 99 mg/dL    Comment: Glucose reference range applies only to samples taken after fasting for at least 8 hours.   BUN 24 (H) 8 - 23 mg/dL   Creatinine, Ser 1.56 (H) 0.44 - 1.00 mg/dL   Calcium 10.0 8.9 - 10.3 mg/dL   GFR, Estimated 35 (L) >60 mL/min    Comment: (NOTE) Calculated using the CKD-EPI Creatinine Equation (2021)    Anion gap 5 5 - 15    Comment: Performed at [facility], [address] [place], [place] [address]  CBC     Status: Abnormal   Collection Time: 07/06/21  4:03 AM  Result Value Ref Range   WBC 4.6 4.0 - 10.5 K/uL   RBC 2.73 (L) 3.87 - 5.11 MIL/uL   Hemoglobin 8.2 (L) 12.0 - 15.0 g/dL   HCT 25.9 (L) 36.0 - 46.0 %   MCV 94.9 80.0 - 100.0 fL   MCH 30.0 26.0 - 34.0 pg   MCHC 31.7  30.0 - 36.0 g/dL   RDW 15.3 11.5 - 15.5 %   Platelets 290 150 - 400 K/uL   nRBC 0.0 0.0 - 0.2 %    Comment: Performed at [facility], [address], [place], [place] [address]  Magnesium     Status: None   Collection Time: 07/06/21  4:03 AM  Result Value Ref Range   Magnesium 2.2 1.7 - 2.4 mg/dL    Comment: Performed at [facility], [address] [place], [place] [address]   No results found.  Review of Systems  Constitutional: Negative.   HENT: Negative.    Eyes: Negative.   Respiratory: Negative.    Cardiovascular: Negative.   Gastrointestinal: Negative.   Musculoskeletal: Negative.   Skin: Negative.   Neurological: Negative.   Psychiatric/Behavioral:  Positive for decreased concentration and dysphoric mood.    Blood pressure 122/77, pulse (!) 102, temperature (!) 97.5 F (36.4 C), temperature source Temporal, resp. rate 16, SpO2 96 %. Physical Exam Constitutional:      Appearance: She is well-developed.  HENT:     Head: Normocephalic and atraumatic.  Eyes:  Conjunctiva/sclera: Conjunctivae normal.     Pupils: Pupils are equal, round, and reactive to light.  Cardiovascular:     Heart sounds: Normal heart sounds.  Pulmonary:     Effort: Pulmonary effort is normal.  Abdominal:     Palpations: Abdomen is soft.  Musculoskeletal:        General: Normal range of motion.     Cervical back: Normal range of motion.  Skin:    General: Skin is warm and dry.  Neurological:     Mental Status: She is alert.  Psychiatric:        Attention and Perception: Attention normal.        Mood and Affect: Mood is depressed.        Speech: Speech is delayed.        Cognition and Memory: She exhibits impaired recent memory.     Assessment/Plan Continue course at this point.  She is showing clear improvement and it is not clear if she has plateaued or not.  Definitely having memory impairment.  Continue at least on Monday and then reassess for whether she is  ready for rehab.  I spoke with her daughter tonight who understands and agrees with the plan  Alethia Berthold, MD 07/06/2021, 5:22 PM

## 2021-07-06 NOTE — Procedures (Signed)
ECT SERVICES Physicians Interval Evaluation & Treatment Note  Patient Identification: ODALIS JORDAN MRN:  [id number] Date of Evaluation:  07/06/2021 TX #: [id number]  MADRS:   MMSE:   P.E. Findings:  Physical is basically the same although she is much more interactive than previously  Psychiatric Interval Note:  Affect and behavior and energy level clearly improved  Subjective:  Patient is a 76 y.o. female seen for evaluation for Electroconvulsive Therapy. Patient sees some improvement.  Her short-term memory is so impaired that it is hard for her to be aware of it  Treatment Summary:   []   Right Unilateral             [x]  Bilateral   % Energy : 1.0 ms 60%   Impedance: 2430 ohms  Seizure Energy Index: 57,165 V squared  Postictal Suppression Index: 79%  Seizure Concordance Index: 93%  Medications  Pre Shock: Robinul 0.1 mg Brevital 70 mg succinylcholine 90 mg  Post Shock: Versed 2 mg  Seizure Duration: 31 seconds EMG 77 seconds EEG   Comments: Continue index course next treatment Monday and then reassess  Lungs:  [x]   Clear to auscultation               []  Other:   Heart:    [x]   Regular rhythm             []  irregular rhythm    [x]   Previous H&P reviewed, patient examined and there are NO CHANGES                 []   Previous H&P reviewed, patient examined and there are changes noted.   Alethia Berthold, MD 2/10/20235:37 PM

## 2021-07-06 NOTE — Progress Notes (Signed)
Patient awake to name, at intervals answers questions appropriately. Tolerated ECT without anesthesia events.

## 2021-07-06 NOTE — Transfer of Care (Cosign Needed Addendum)
Immediate Anesthesia Transfer of Care Note  Patient: Amber [name]  Procedure(s) Performed: ECT TX  Patient Location: PACU  Anesthesia Type:General  Level of Consciousness: drowsy  Airway & Oxygen Therapy: Patient Spontanous Breathing and Patient connected to face mask oxygen  Post-op Assessment: Report given to RN and Post -op Vital signs reviewed and stable  Post vital signs: Reviewed and stable  Last Vitals:  Vitals Value Taken Time  BP 111/64   Temp 36   Pulse 101   Resp 16   SpO2 100     Last Pain: There were no vitals filed for this visit.       Complications: No notable events documented.

## 2021-07-06 NOTE — Anesthesia Preprocedure Evaluation (Addendum)
Anesthesia Evaluation  Patient identified by MRN, date of birth, ID band Patient awake    Reviewed: Allergy & Precautions, NPO status , Patient's Chart, lab work & pertinent test results  History of Anesthesia Complications Negative for: history of anesthetic complications  Airway Mallampati: III   Neck ROM: Full    Dental  (+) Edentulous Upper, Edentulous Lower   Pulmonary asthma , former smoker,  COVID 06/08/20 s/p remdesivir   Pulmonary exam normal breath sounds clear to auscultation       Cardiovascular hypertension, Normal cardiovascular exam Rhythm:Regular Rate:Normal     Neuro/Psych PSYCHIATRIC DISORDERS Anxiety Depression    GI/Hepatic GERD  ,  Endo/Other  Hyperthyroidism   Renal/GU Renal disease (stage III CKD)     Musculoskeletal   Abdominal   Peds  Hematology  (+) Blood dyscrasia, anemia , Chronic DVT on Eliquis   Anesthesia Other Findings Failure to thrive  Reproductive/Obstetrics                            Anesthesia Physical Anesthesia Plan  ASA: 4  Anesthesia Plan: General   Post-op Pain Management:    Induction: Intravenous  PONV Risk Score and Plan: 3 and TIVA and Treatment may vary due to age or medical condition  Airway Management Planned: Natural Airway  Additional Equipment:   Intra-op Plan:   Post-operative Plan:   Informed Consent: I have reviewed the patients History and Physical, chart, labs and discussed the procedure including the risks, benefits and alternatives for the proposed anesthesia with the patient or authorized representative who has indicated his/her understanding and acceptance.   Patient has DNR.  Suspend DNR and Discussed DNR with power of attorney.   Consent reviewed with POA  Plan Discussed with: CRNA  Anesthesia Plan Comments: (LMA/GETA backup discussed.  Serial consent on chart from patient's family, who was consented for  risks of anesthesia including but not limited to:  - adverse reactions to medications - damage to eyes, teeth, lips or other oral mucosa - nerve damage due to positioning  - sore throat or hoarseness - damage to heart, brain, nerves, lungs, other parts of body or loss of life  Informed family about role of CRNA in peri- and intra-operative care; family voiced understanding.  Family agrees to suspend DNR for intra- and peri-operative period.)        Anesthesia Quick Evaluation

## 2021-07-06 NOTE — Progress Notes (Signed)
Patient awake, talkative, asking for a [employer] "wooper" No stool while in pacu

## 2021-07-06 NOTE — TOC Progression Note (Signed)
Transition of Care [facility]) - Progression Note    Patient Details  Name: MODINE OPPENHEIMER MRN: [id number] Date of Birth: [birth year]  Transition of Care [facility]) CM/SW [place], RN Phone Number: 07/06/2021, 1:46 PM  Clinical Narrative:   Patient's daughter would like update on patient's medical and Psychiatric progress and length of ECT treatments.  Message sent to Dr. Roosevelt Locks, Dr. [place] Cooks and patient's nurse.  TOC to follow.    Expected Discharge Plan:  (TBD) Barriers to Discharge: Continued Medical Work up  Expected Discharge Plan and Services Expected Discharge Plan:  (TBD)   Discharge Planning Services: CM Consult Post Acute Care Choice:  (TBD) Living arrangements for the past 2 months: Single Family Home                                       Social Determinants of Health (SDOH) Interventions    Readmission Risk Interventions No flowsheet data found.

## 2021-07-07 NOTE — Progress Notes (Signed)
Chaplain On-Call received a call from [employer], who reported that the patient seems lonely and might benefit from speaking with a Chaplain.  Chaplain donned the necessary PPE and met the patient. Patient described her difficulties with eating, and her anxiety that a feeding tube might be placed on Monday.  Chaplain offered supportive listening and spiritual and emotional encouragement.  Chaplain Pollyann Samples M.Div., [facility]

## 2021-07-07 NOTE — Progress Notes (Signed)
RN notified MD of elevated HR. No new order at the time of notification. Repeat vitals rechecked around 1800, MD made aware of repeat elevated HR. MD asked for EKG, order placed. Will continue to monitor.

## 2021-07-07 NOTE — Progress Notes (Signed)
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 Weber Cooks of psychiatry.     She was admitted to [facility] from 06/08/21-06/19/21 after presenting with failure to thrive following a several week history of complaints of difficulty swallowing with decreased oral intake and subsequent development of confusion and hallucinations.  She had outpatient work-up include brain MRI as well as MRV which was unremarkable.  On admission she is found to have an AKI and several electrolyte abnormalities.  While at [facility] she refused to eat and refused further evaluation with esophagram to evaluate her sensation of dysphagia.  She is evaluated by psychiatry who determined her to not have capacity for medical decision-making and so an NG tube was placed for force-feeding.  Palliative care consult was also done.  Due to her poor response to oral antidepressants ECG was recommended and thus arranged to transfer to [facility].  On 06/08/2020 she tested positive for COVID and she received 3 days of remdesivir.  Most of her nutritional deficiencies related to inadequate oral intake were corrected prior to transfer and on the day of discharge she developed acute urinary retention a Foley catheter was placed and it was traumatic insertion with gross development of gross hematuria.  Hemoglobin did drop to 7.0    On the afternoon of 06/21/21 she spiked a temperature of 101.9 and became tachycardic and slightly hypotensive.  She is given IV fluid bolus and she is pancultured.  Further work-up revealed that she had a right lower lobe pneumonia so she was started on  IV Unasyn and completed a course.    Given that she continues to have some mild loose stools ID felt that since the patient was positive toxin producing C. difficile empirically treat with vancomycin for 10 days.  Foley catheter has been removed.  she appeared to have a positive response to ECT.     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 Patient condition is improving significantly, discussed with Dr. Weber Cooks, last ECT treatment is on Monday, will looking for placement.    Severe protein calorie malnutrition  Anorexia. Patient initially had a Dobbhoff until 1/31 when she pulled out. Appetite improving after giving megestrol.  Aspiration pneumonia  Sepsis secondary to aspiration pneumonia Patient has completed antibiotics.   Acute blood loss anemia Vitamin B12 deficient anemia. Gross hematuria secondary to Foley catheter trauma. Thrombocytopenia. Reviewed previous labs, patient had a B12 deficiency.  Currently on B12 orally Iron  level low, ferritin level elevated. Patient no longer has any hematuria.  Platelets has normalized, hemoglobin stable.  Urinary retention. Continue as needed bladder scan, I and O catheter if needed.  Avoid the Foley catheter the patient had a recent hematuria from Foley damage.  Chronic DVT and  new PE. Continue Eliquis.  Recent COVID-19 infection. Condition had improved.  Acute kidney injury. Metabolic acidosis. Hypokalemia.  Improved. Chronic kidney disease ruled out. Function relatively stable       DVT prophylaxis: Eliquis Code Status: DNR Family Communication:  Disposition Plan:      Status is: Inpatient Remains inpatient appropriate because: Severity of disease, ECT treatment,        I/O last 3 completed shifts: In: 425 [I.V.:125; IV Piggyback:300] Out: 400 [Urine:400] No intake/output data recorded.     Consultants:  Psychiatry  Procedures: ECT  Antimicrobials: None  Subjective: Patient doing well today, she slept well last night.  She had some confusion when she woke up. Appetite improved.  No nausea or vomiting. No short of breath or cough.  Objective: Vitals:   07/06/21 2007 07/07/21 0500 07/07/21 0509 07/07/21 0803  BP: 124/83  121/74 (!) 143/88  Pulse: (!) 108  92 (!) 109  Resp: 16  16 17   Temp: 98.4 F (36.9 C)  98.7 F (37.1 C) 98.4 F (36.9 C)  TempSrc: Oral  Oral Oral  SpO2: 100%  98% 100%  Weight:  63.4 kg    Height:        Intake/Output Summary (Last 24 hours) at 07/07/2021 1119 Last data filed at 07/06/2021 1512 Gross per 24 hour  Intake 425 ml  Output --  Net 425 ml   Filed Weights   06/29/21 0530 06/30/21 0500 07/07/21 0500  Weight: 57.4 kg 57.4 kg 63.4 kg    Examination:  General exam: Appears calm and comfortable  Respiratory system: Clear to auscultation. Respiratory effort normal. Cardiovascular system: S1 & S2 heard, RRR. No JVD, murmurs, rubs, gallops or clicks. No pedal edema. Gastrointestinal system: Abdomen is nondistended, soft and nontender. No organomegaly or masses felt. Normal bowel sounds heard. Central nervous system: Alert and oriented. No focal neurological deficits. Extremities: Symmetric 5 x 5 power. Skin: No rashes, lesions or ulcers Psychiatry: Judgement and insight appear normal. Mood  & affect appropriate.     Data Reviewed: I have personally reviewed following labs and imaging studies  CBC: Recent Labs  Lab 07/02/21 0518 07/04/21 0434 07/06/21 0403  WBC 5.7 5.4 4.6  HGB 8.2* 8.4* 8.2*  HCT 26.3* 27.1* 25.9*  MCV 93.6 96.4 94.9  PLT 410* 363 450   Basic Metabolic Panel: Recent Labs  Lab 07/02/21 0518 07/04/21 0434 07/06/21 0403  NA 143 143 143  K 3.3* 4.0 3.2*  CL 116* 117* 114*  CO2 23 24 24   GLUCOSE 87 88 92  BUN 21 21 24*  CREATININE 1.34* 1.38* 1.56*  CALCIUM 9.9 9.9 10.0  MG 2.1 1.9 2.2   GFR: Estimated Creatinine Clearance: 30.1 mL/min (A) (by C-G formula based on SCr of 1.56 mg/dL (H)). Liver Function Tests: No results for input(s): AST, ALT, ALKPHOS, BILITOT, PROT, ALBUMIN in the last 168 hours. No results for input(s): LIPASE, AMYLASE in the last 168 hours. No results for input(s): AMMONIA in the last 168 hours. Coagulation Profile: No results for input(s): INR, PROTIME in the last 168 hours. Cardiac Enzymes: No results for input(s): CKTOTAL, CKMB, CKMBINDEX, TROPONINI in the last 168 hours. BNP (last 3 results) No results for  input(s): PROBNP in the last 8760 hours. HbA1C: No results for input(s): HGBA1C in the last 72 hours. CBG: Recent Labs  Lab 06/30/21 1217  GLUCAP 79   Lipid Profile: No results for input(s): CHOL, HDL, LDLCALC, TRIG, CHOLHDL, LDLDIRECT in the last 72 hours. Thyroid Function Tests: No results for input(s): TSH, T4TOTAL, FREET4, T3FREE, THYROIDAB in the last 72 hours. Anemia Panel: No results for input(s): VITAMINB12, FOLATE, FERRITIN, TIBC, IRON, RETICCTPCT in the last 72 hours. Sepsis Labs: No results for input(s): PROCALCITON, LATICACIDVEN in the last 168 hours.  No results found for this or any previous visit (from the past 240 hour(s)).       Radiology Studies: No results found.      Scheduled Meds:  (feeding supplement) PROSource Plus  30 mL Oral TID BM   sodium chloride   Intravenous  Once   apixaban  5 mg Oral BID   Chlorhexidine Gluconate Cloth  6 each Topical Daily   cyanocobalamin  1,000 mcg Intramuscular Daily   diphenhydrAMINE  50 mg Intravenous Once   haloperidol lactate  0.5 mg Intravenous QHS   megestrol  400 mg Oral Daily   mirtazapine  30 mg Oral QHS   multivitamin with minerals  1 tablet Oral Daily   OLANZapine zydis  5 mg Oral QHS   tamsulosin  0.4 mg Oral QPC supper   thiamine  100 mg Oral Daily   Vitamin D (Ergocalciferol)  50,000 Units Oral Q7 days   Continuous Infusions:   LOS: 18 days    Time spent: 27 minutes    Sharen Hones, MD [facility]   To contact the attending provider between 7A-7P or the covering provider during after hours 7P-7A, please log into the web site [email or web address] and access using universal [place] password for that web site. If you do not have the password, please call the hospital operator.  07/07/2021, 11:19 AM

## 2021-07-07 NOTE — Progress Notes (Signed)
°   07/07/21 1648  Assess: MEWS Score  Temp 98.1 F (36.7 C)  BP 135/89  Pulse Rate (!) 114  Resp 18  SpO2 100 %  O2 Device Room Air  Assess: MEWS Score  MEWS Temp 0  MEWS Systolic 0  MEWS Pulse 2  MEWS RR 0  MEWS LOC 0  MEWS Score 2  MEWS Score Color Yellow  Assess: if the MEWS score is Yellow or Red  Were vital signs taken at a resting state? Yes  Focused Assessment No change from prior assessment  Does the patient meet 2 or more of the SIRS criteria? No  Does the patient have a confirmed or suspected source of infection? Yes  Provider and Rapid Response Notified? Yes (provider notified and no new orders in place.)  Early Detection of Sepsis Score *See Row Information* Low  MEWS guidelines implemented *See Row Information* No, vital signs rechecked  Take Vital Signs  Increase Vital Sign Frequency  Yellow: Q 2hr X 2 then Q 4hr X 2, if remains yellow, continue Q 4hrs  Escalate  MEWS: Escalate Yellow: discuss with charge nurse/RN and consider discussing with provider and RRT  Notify: Charge Nurse/RN  Name of Charge Nurse/RN Notified Caryl Pina RN  Date Charge Nurse/RN Notified 07/07/21  Time Charge Nurse/RN Notified 1653  Notify: Provider  Provider Name/Title Dr Danford Bad, MD  Date Provider Notified 07/07/21  Time Provider Notified [phone]  Notification Type Page  Notification Reason Other (Comment) (HR elevated, not symptomatic)  Provider response No new orders  Assess: SIRS CRITERIA  SIRS Temperature  0  SIRS Pulse 1  SIRS Respirations  0  SIRS WBC 0  SIRS Score Sum  1   Notified provider of elevated HR. No new orders at this time. Patient is not symptomatic, will continue to monitor.

## 2021-07-07 NOTE — Consult Note (Signed)
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[place]) Active Problems:   Moderate protein-calorie malnutrition ([place])   Primary hyperparathyroidism ([place])   Dysphagia   COVID-19 virus infection 06/08/2021   Nasogastric tube present   Chronic deep vein thrombosis (DVT) (HCC)   Chronic anticoagulation   ABLA (acute blood loss anemia)   Acute urinary retention   Foley catheter in place   Stage 3a chronic kidney disease (HCC)   At high risk for inadequate nutritional intake   Gross hematuria   Fever   Aspiration pneumonia of right lung due to gastric secretions (HCC)   Total Time spent with patient: 20 minutes  Subjective:   Amber [name] is a 76 y.o. female patient admitted with "I am doing fine".  HPI: Patient seen and chart reviewed.  76 year old woman who has now received 6 electroconvulsive therapy treatments.  Patient was awake and alert when I came in to see her.  She told me that she had eaten today and could enumerate several things that she had had to eat.  She said she supposed she was feeling okay.  Nevertheless she said that she still believed it was impossible that she could stand up or walk because she was too weak.  She did not report being depressed.  Denied any suicidal thoughts.  Did not appear to be responding to internal stimuli.  She remembered who I was but clearly had short-term memory issues otherwise  Past Psychiatric History: Past history of depression  Risk to Self:   Risk to Others:   Prior Inpatient Therapy:   Prior Outpatient Therapy:    Past Medical History:  Past Medical History:  Diagnosis Date   Anxiety    Hypertension    History reviewed. No pertinent surgical history. Family History:  Family History  Problem Relation  Age of Onset   High blood pressure Maternal Grandmother    Family Psychiatric  History: See previous Social History:  Social History   Substance and Sexual Activity  Alcohol Use Not Currently     Social History   Substance and Sexual Activity  Drug Use Not Currently    Social History   Socioeconomic History   Marital status: Married    Spouse name: Not on file   Number of children: Not on file   Years of education: Not on file   Highest education level: Not on file  Occupational History   Not on file  Tobacco Use   Smoking status: Former    Types: Cigarettes   Smokeless tobacco: Never  Vaping Use   Vaping Use: Never used  Substance and Sexual Activity   Alcohol use: Not Currently   Drug use: Not Currently   Sexual activity: Not Currently  Other Topics Concern   Not on file  Social History Narrative   Right handed    one story home    lives with family    Doesn't work currently   Social Determinants of Radio broadcast assistant Strain: Not on file  Food Insecurity: Not on file  Transportation Needs: Not on file  Physical Activity: Not on file  Stress: Not on file  Social Connections: Not on file   Additional Social History:    Allergies:   Allergies  Allergen Reactions   Shellfish Allergy     "  I pass out"    Labs:  Results for orders placed or performed during the hospital encounter of 06/19/21 (from the past 48 hour(s))  Basic metabolic panel     Status: Abnormal   Collection Time: 07/06/21  4:03 AM  Result Value Ref Range   Sodium 143 135 - 145 mmol/L   Potassium 3.2 (L) 3.5 - 5.1 mmol/L   Chloride 114 (H) 98 - 111 mmol/L   CO2 24 22 - 32 mmol/L   Glucose, Bld 92 70 - 99 mg/dL    Comment: Glucose reference range applies only to samples taken after fasting for at least 8 hours.   BUN 24 (H) 8 - 23 mg/dL   Creatinine, Ser 1.56 (H) 0.44 - 1.00 mg/dL   Calcium 10.0 8.9 - 10.3 mg/dL   GFR, Estimated 35 (L) >60 mL/min    Comment:  (NOTE) Calculated using the CKD-EPI Creatinine Equation (2021)    Anion gap 5 5 - 15    Comment: Performed at [facility], [address] [place], [place] [address]  CBC     Status: Abnormal   Collection Time: 07/06/21  4:03 AM  Result Value Ref Range   WBC 4.6 4.0 - 10.5 K/uL   RBC 2.73 (L) 3.87 - 5.11 MIL/uL   Hemoglobin 8.2 (L) 12.0 - 15.0 g/dL   HCT 25.9 (L) 36.0 - 46.0 %   MCV 94.9 80.0 - 100.0 fL   MCH 30.0 26.0 - 34.0 pg   MCHC 31.7 30.0 - 36.0 g/dL   RDW 15.3 11.5 - 15.5 %   Platelets 290 150 - 400 K/uL   nRBC 0.0 0.0 - 0.2 %    Comment: Performed at [facility], [address], [place], [place] [address]  Magnesium     Status: None   Collection Time: 07/06/21  4:03 AM  Result Value Ref Range   Magnesium 2.2 1.7 - 2.4 mg/dL    Comment: Performed at [facility], [address], [place], [place] [address]    Current Facility-Administered Medications  Medication Dose Route Frequency Provider Last Rate Last Admin   (feeding supplement) PROSource Plus liquid 30 mL  30 mL Oral TID BM Enzo Bi, MD   30 mL at 07/07/21 0955   0.9 %  sodium chloride infusion (Manually program via Guardrails IV Fluids)   Intravenous Once Athena Masse, MD       acetaminophen (TYLENOL) tablet 650 mg  650 mg Oral Q6H PRN Athena Masse, MD   650 mg at 07/05/21 2203   Or   acetaminophen (TYLENOL) suppository 650 mg  650 mg Rectal Q6H PRN Athena Masse, MD       apixaban Arne Cleveland) tablet 5 mg  5 mg Oral BID Sheikh, Omair [place], DO   5 mg at 07/07/21 0944   artificial tears (LACRILUBE) ophthalmic ointment   Both Eyes Q3H PRN Sharion Settler, NP   Given at 06/30/21 2130   Chlorhexidine Gluconate Cloth 2 % PADS 6 each  6 each Topical Daily Raiford Noble [place], DO   6 each at 07/06/21 1200   cyanocobalamin ((VITAMIN B-12)) injection 1,000 mcg  1,000 mcg Intramuscular Daily Judd Gaudier V, MD   1,000 mcg at 07/07/21 0946   diphenhydrAMINE (BENADRYL) injection 50 mg   50 mg Intravenous Once Tramond Slinker T, MD       haloperidol lactate (HALDOL) injection 0.5 mg  0.5 mg Intravenous QHS Athena Masse, MD   0.5 mg  at 07/06/21 2103   megestrol (MEGACE) 400 MG/10ML suspension 400 mg  400 mg Oral Daily Sharen Hones, MD   400 mg at 07/05/21 0853   mirtazapine (REMERON) tablet 30 mg  30 mg Oral QHS Ilynn Stauffer T, MD   30 mg at 07/06/21 2101   multivitamin with minerals tablet 1 tablet  1 tablet Oral Daily Enzo Bi, MD   1 tablet at 07/05/21 0853   OLANZapine zydis (ZYPREXA) disintegrating tablet 5 mg  5 mg Oral QHS Sharada Albornoz T, MD   5 mg at 07/06/21 2102   ondansetron (ZOFRAN) tablet 4 mg  4 mg Oral Q6H PRN Athena Masse, MD       Or   ondansetron [facility]) injection 4 mg  4 mg Intravenous Q6H PRN Athena Masse, MD   4 mg at 07/05/21 0947   tamsulosin (FLOMAX) capsule 0.4 mg  0.4 mg Oral QPC supper Athena Masse, MD   0.4 mg at 07/06/21 1717   thiamine tablet 100 mg  100 mg Oral Daily Enzo Bi, MD   100 mg at 07/07/21 0944   Vitamin D (Ergocalciferol) (DRISDOL) capsule 50,000 Units  50,000 Units Oral Q7 days Athena Masse, MD   50,000 Units at 07/06/21 1717    Musculoskeletal: Strength & Muscle Tone: decreased Gait & Station: unable to stand Patient leans: N/A            Psychiatric Specialty Exam:  Presentation  General Appearance: -- (in hospital bed, had to pull covers down to examine)  Eye Contact:Minimal  Speech:Clear and Coherent  Speech Volume:Decreased  Handedness:Right   Mood and Affect  Mood:-- ("I don't want to talk")  Affect:Congruent   Thought Process  Thought Processes:-- (poverty)  Descriptions of Associations:Intact  Orientation:Full (Time, Place and Person)  Thought Content:-- (devoid of SI, HI, moderate improvement in delusions)  History of Schizophrenia/Schizoaffective disorder:No  Duration of Psychotic Symptoms:Less than six months  Hallucinations:No data recorded Ideas of  Reference:None  Suicidal Thoughts:No data recorded Homicidal Thoughts:No data recorded  Sensorium  Memory:Immediate Fair; Recent Poor; Remote Poor  Judgment:Poor  Insight:Lacking   Executive Functions  Concentration:Poor  Attention Span:Poor  Reading of Knowledge:Poor  Language:Poor   Psychomotor Activity  Psychomotor Activity:No data recorded  Assets  Assets:Social Support   Sleep  Sleep:No data recorded  Physical Exam: Physical Exam Vitals and nursing note reviewed.  Constitutional:      Appearance: Normal appearance.  HENT:     Head: Normocephalic and atraumatic.     Mouth/Throat:     Pharynx: Oropharynx is clear.  Eyes:     Pupils: Pupils are equal, round, and reactive to light.  Cardiovascular:     Rate and Rhythm: Normal rate and regular rhythm.  Pulmonary:     Effort: Pulmonary effort is normal.     Breath sounds: Normal breath sounds.  Abdominal:     General: Abdomen is flat.     Palpations: Abdomen is soft.  Musculoskeletal:        General: Normal range of motion.  Skin:    General: Skin is warm and dry.  Neurological:     General: No focal deficit present.     Mental Status: She is alert. Mental status is at baseline.  Psychiatric:        Attention and Perception: Attention normal.        Mood and Affect: Mood normal.        Speech: Speech normal.  Behavior: Behavior is cooperative.        Thought Content: Thought content normal.        Cognition and Memory: Memory is impaired.        Judgment: Judgment is inappropriate.   Review of Systems  Constitutional: Negative.   HENT: Negative.    Eyes: Negative.   Respiratory: Negative.    Cardiovascular: Negative.   Gastrointestinal: Negative.   Musculoskeletal: Negative.   Skin: Negative.   Neurological:  Positive for weakness.  Psychiatric/Behavioral:  Positive for memory loss.   Blood pressure (!) 143/88, pulse (!) 109, temperature 98.4 F (36.9 C), temperature  source Oral, resp. rate 17, height 5\' 6"  (1.676 m), weight 63.4 kg, SpO2 100 %. Body mass index is 22.56 kg/m.  Treatment Plan Summary: Medication management and Plan patient has been receiving ECT and also is receiving appropriate medication for psychotic depression.  She has responded well and is now eating from what I understand.  Looks much better.  Cognitively and affectively much brighter.  Still for some reason negativistic about being able to walk.  I have put in an order for physical therapy.  I have emphasized strongly to her that she must work gradually on getting her strength back.  Plan is for another ECT treatment on Monday after which we will reassess whether we are at a maximum benefit point.  Disposition:  See above  Alethia Berthold, MD 07/07/2021 3:39 PM

## 2021-07-08 ENCOUNTER — Other Ambulatory Visit: Payer: Self-pay | Admitting: Psychiatry

## 2021-07-08 MED ORDER — KCL-LACTATED RINGERS 20 MEQ/L IV SOLN
INTRAVENOUS | Status: DC
  Filled 2021-07-08: qty 1000

## 2021-07-08 MED ORDER — POTASSIUM CHLORIDE 2 MEQ/ML IV SOLN
INTRAVENOUS | Status: AC
  Filled 2021-07-08: qty 1000

## 2021-07-08 NOTE — Evaluation (Signed)
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 Weber Cooks of psychiatry.  Admitted at [facility] from 06/08/21-06/19/21 after presenting with failure to thrive following a several week history of complaints of difficulty swallowing with decreased oral intake and subsequent development of confusion and hallucinations.   Clinical Impression  The patient was agreeable to PT and some limited mobility today. She was able to sit unsupported on the side of the bed for ~ 5 minutes. She reports multiple times that she will be unable to stand due to LE weakness and numbness. Encouragement provided throughout session and education provided on importance of mobilizing as able with therapy. Heart rate up to 130bpm while sitting and patient reports limited fatigue with upright seated activity. LE strengthening exercises provided in bed. Recommend to continue PT to maximize independence and facilitate return to prior level of function with SNF recommended at discharge at this time.      Recommendations for follow up therapy are one component of a multi-disciplinary discharge planning process, led by the attending physician.  Recommendations may be updated based on patient status, additional functional criteria and insurance authorization.  Follow Up Recommendations Skilled nursing-short term rehab (<3 hours/day)    Assistance Recommended at Discharge Frequent or constant Supervision/Assistance  Patient can return home with the following  Two people to help with walking and/or transfers;Two people to help with  bathing/dressing/bathroom;Help with stairs or ramp for entrance    Equipment Recommendations  (to be determined at next level of care)  Recommendations for Other Services       Functional Status Assessment Patient has had a recent decline in their functional status and demonstrates the ability to make significant improvements in function in a reasonable and predictable amount of time.     Precautions / Restrictions Precautions Precautions: Fall Restrictions Weight Bearing Restrictions: No      Mobility  Bed Mobility Overal bed mobility: Needs Assistance Bed Mobility: Supine to Sit, Sit to Supine     Supine to sit: Supervision Sit to supine: Min guard   General bed mobility comments: supine to long sitting with supervision. patient first agreeable to long sitting in bed. once she sat up for ~ 2 minutes, she was agreeable to dangle at edge of bed. increased time required. total sitting time unsupported ~ 5 minutes. noted heart rate up to 130bpm in sitting    Transfers                   General transfer comment: patient refused to stand, stating she would be unable to due to LE weakness and numbness. educated patient on the importance of mobilizing and attempting to get out of bed during future therapy sessions. she does report a goal of being able to use the bed side commode and eventually being able to ambulate again    Ambulation/Gait                  Stairs            Wheelchair Mobility    Modified Rankin (Stroke Patients Only)       Balance Overall balance assessment: Needs assistance  Sitting-balance support: Feet unsupported Sitting balance-Leahy Scale: Fair Sitting balance - Comments: no loss of balance in sitting with sitting time of ~ 5 minutes                                     Pertinent Vitals/Pain Pain Assessment Pain Assessment: No/denies pain    Home Living Family/patient expects to be discharged to:: Private  residence Living Arrangements: Spouse/significant other     Home Access: Stairs to enter   [employer] of Steps: 3       Additional Comments: patient reports she has been in the hospital for about a month but was living with her spouse prior to that.    Prior Function Prior Level of Function : Needs assist             Mobility Comments: patient is a poor historian. patient reports she has not ambulated in a while due to weakness and numbness in BLE       Hand Dominance        Extremity/Trunk Assessment   Upper Extremity Assessment Upper Extremity Assessment: Generalized weakness    Lower Extremity Assessment Lower Extremity Assessment: RLE deficits/detail;LLE deficits/detail (she is able to complete partial SLR bilaterally, question if she provides full effort) RLE Deficits / Details: she is able to complete partial SLR bilaterally, question if she provides full effort RLE Sensation: decreased light touch LLE Deficits / Details: she is able to complete partial SLR bilaterally, question if she provides full effort LLE Sensation: decreased light touch       Communication      Cognition Arousal/Alertness: Awake/alert Behavior During Therapy: Flat affect Overall Cognitive Status: No family/caregiver present to determine baseline cognitive functioning                                 General Comments: patient is able to follow commands with increased time. she is oriented to person, place, time. she expressed multiple times that she would be unable to get out of bed. she needs encourgaement but was agreeable to mobility in the bed        General Comments      Exercises General Exercises - Lower Extremity Ankle Circles/Pumps: AAROM, Strengthening, Both, 5 reps, Supine Straight Leg Raises: AAROM, Strengthening, Both, 5 reps, Supine   Assessment/Plan    PT Assessment Patient needs continued PT services  PT Problem List Decreased  strength;Decreased range of motion;Decreased activity tolerance;Decreased balance;Decreased mobility;Decreased cognition;Decreased safety awareness       PT Treatment Interventions DME instruction;Gait training;Functional mobility training;Therapeutic activities;Therapeutic exercise;Balance training;Neuromuscular re-education;Cognitive remediation;Patient/family education    PT Goals (Current goals can be found in the Care Plan section)  Acute Rehab PT Goals Patient Stated Goal: to use bed side commode and eventually walk again PT Goal Formulation: With patient Time For Goal Achievement: 07/22/21 Potential to Achieve Goals: Fair    Frequency Min 2X/week     Co-evaluation               AM-PAC PT "6 Clicks" Mobility  Outcome Measure Help needed turning from your back to your side while in a flat bed without using bedrails?: A Little Help needed moving from lying on your back to sitting on the side of a flat bed without using bedrails?: A Little Help needed moving to and from a bed  to a chair (including a wheelchair)?: A Lot Help needed standing up from a chair using your arms (e.g., wheelchair or bedside chair)?: Total Help needed to walk in hospital room?: Total Help needed climbing 3-5 steps with a railing? : Total 6 Click Score: 11    End of Session   Activity Tolerance: Patient tolerated treatment well (self limited progression of OOB activity) Patient left: in bed;with call bell/phone within reach;with bed alarm set Nurse Communication: Mobility status PT Visit Diagnosis: Other abnormalities of gait and mobility (R26.89);Muscle weakness (generalized) (M62.81)    Time: 6599-3570 PT Time Calculation (min) (ACUTE ONLY): 27 min   Charges:   PT Evaluation $PT Eval Low Complexity: 1 Low PT Treatments $Therapeutic Exercise: 8-22 mins        Minna Merritts, PT, MPT   Percell Locus 07/08/2021, 1:08 PM

## 2021-07-08 NOTE — Progress Notes (Signed)
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 Weber Cooks of psychiatry.     She was admitted to [facility] from 06/08/21-06/19/21 after presenting with failure to thrive following a several week history of complaints of difficulty swallowing with decreased oral intake and subsequent development of confusion and hallucinations.  She had outpatient work-up include brain MRI as well as MRV which was unremarkable.  On admission she is found to have an AKI and several electrolyte abnormalities.  While at [facility] she refused to eat and refused further evaluation with esophagram to evaluate her sensation of dysphagia.  She is evaluated by psychiatry who determined her to not have capacity for medical decision-making and so an NG tube was placed for force-feeding.  Palliative care consult was also done.  Due to her poor response to oral antidepressants ECG was recommended and thus arranged to transfer to [facility].  On 06/08/2020 she tested positive for COVID and she received 3 days of remdesivir.  Most of her nutritional deficiencies related to inadequate oral intake were corrected prior to transfer and on the day of discharge she developed acute urinary retention a Foley catheter was placed and it was traumatic insertion with gross development of gross hematuria.  Hemoglobin did drop to 7.0    On the afternoon of 06/21/21 she spiked a temperature of 101.9 and became tachycardic and slightly hypotensive.  She is given IV fluid bolus and she is pancultured.  Further work-up revealed that she had a right lower lobe pneumonia so she was started on  IV Unasyn and completed a course.    Given that she continues to have some mild loose stools ID felt that since the patient was positive toxin producing C. difficile empirically treat with vancomycin for 10 days.  Foley catheter has been removed.  she appeared to have a positive response to ECT.     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[place]) Active Problems:   Moderate protein-calorie malnutrition ([place])   Primary hyperparathyroidism ([place])   Dysphagia   COVID-19 virus infection 06/08/2021   Nasogastric tube present   Chronic deep vein thrombosis (DVT) (HCC)   Chronic anticoagulation   ABLA (acute blood loss anemia)   Acute urinary retention   Foley catheter in place   Stage 3a chronic kidney disease (HCC)   At high risk for inadequate nutritional intake   Gross hematuria   Fever   Aspiration pneumonia of right lung due to gastric secretions (HCC)    Severe protein calorie malnutrition  Anorexia. Patient initially had a Dobb[place] until 1/31 when she pulled out. Patient appetite improving.  She had a sinus tachycardia yesterday, she is mildly dehydrated.  She was not drinking enough water.  We will give small dose IV fluids. Reviewed the EKG performed yesterday, sinus tachycardia.  Aspiration pneumonia  Sepsis secondary to aspiration pneumonia Patient has completed antibiotics.   Acute blood loss anemia Vitamin B12 deficient anemia. Gross hematuria secondary to Foley catheter trauma. Thrombocytopenia. Reviewed previous labs, patient had a B12 deficiency.  Currently on B12 orally Iron level low, ferritin level  elevated. Patient no longer has any hematuria.  Condition has improved.  Urinary retention. Patient was able to urinate better.  Chronic DVT and new PE. Continue Eliquis.  Recent COVID-19 infection. Condition had improved.  Acute kidney injury. Metabolic acidosis. Hypokalemia.  Improved. Chronic kidney disease ruled out. Function  relatively stable       DVT prophylaxis: Eliquis Code Status: DNR Family Communication:  Disposition Plan:      Status is: Inpatient Remains inpatient appropriate because: Severity of disease, ECT treatment,              I/O last 3 completed shifts: In: -  Out: 600 [Urine:600] Total I/O In: -  Out: 150 [Urine:150]     Consultants:  Psych  Procedures: ECT  Antimicrobials: None  Subjective: Patient feels well today, she states that she eats well, but she does not drink enough water. She has not confusion, she is not short of breath or cough.   Objective: Vitals:   07/08/21 0248 07/08/21 0400 07/08/21 0500 07/08/21 0859  BP: (!) 165/94 (!) 145/91  126/90  Pulse: (!) 105 (!) 107  (!) 114  Resp: 18 16  18   Temp: 99.1 F (37.3 C) 98.2 F (36.8 C)  97.9 F (36.6 C)  TempSrc:  Oral  Oral  SpO2: 98% 98%  99%  Weight:   63.4 kg   Height:        Intake/Output Summary (Last 24 hours) at 07/08/2021 1016 Last data filed at 07/08/2021 1012 Gross per 24 hour  Intake --  Output 750 ml  Net -750 ml   Filed Weights   06/30/21 0500 07/07/21 0500 07/08/21 0500  Weight: 57.4 kg 63.4 kg 63.4 kg    Examination:  General exam: Appears calm and comfortable  Respiratory system: Clear to auscultation. Respiratory effort normal. Cardiovascular system: S1 & S2 heard, RRR. No JVD, murmurs, rubs, gallops or clicks. No pedal edema. Gastrointestinal system: Abdomen is nondistended, soft and nontender. No organomegaly or masses felt. Normal bowel sounds heard. Central nervous system: Alert and oriented x3. No focal neurological deficits. Extremities: Symmetric 5 x 5 power. Skin: No rashes, lesions or ulcers Psychiatry: Judgement and insight appear normal. Mood & affect appropriate.     Data Reviewed: I have personally reviewed following labs and imaging studies  CBC: Recent Labs  Lab 07/02/21 0518 07/04/21 0434 07/06/21 0403  WBC 5.7 5.4 4.6  HGB 8.2* 8.4* 8.2*   HCT 26.3* 27.1* 25.9*  MCV 93.6 96.4 94.9  PLT 410* 363 073   Basic Metabolic Panel: Recent Labs  Lab 07/02/21 0518 07/04/21 0434 07/06/21 0403  NA 143 143 143  K 3.3* 4.0 3.2*  CL 116* 117* 114*  CO2 23 24 24   GLUCOSE 87 88 92  BUN 21 21 24*  CREATININE 1.34* 1.38* 1.56*  CALCIUM 9.9 9.9 10.0  MG 2.1 1.9 2.2   GFR: Estimated Creatinine Clearance: 30.1 mL/min (A) (by C-G formula based on SCr of 1.56 mg/dL (H)). Liver Function Tests: No results for input(s): AST, ALT, ALKPHOS, BILITOT, PROT, ALBUMIN in the last 168 hours. No results for input(s): LIPASE, AMYLASE in the last 168 hours. No results for input(s): AMMONIA in the last 168 hours. Coagulation Profile: No results for input(s): INR, PROTIME in the last 168 hours. Cardiac Enzymes: No results for input(s): CKTOTAL, CKMB, CKMBINDEX, TROPONINI in the last 168 hours. BNP (last 3 results) No results for input(s): PROBNP in the last 8760 hours. HbA1C: No results for input(s): HGBA1C in the  last 72 hours. CBG: No results for input(s): GLUCAP in the last 168 hours. Lipid Profile: No results for input(s): CHOL, HDL, LDLCALC, TRIG, CHOLHDL, LDLDIRECT in the last 72 hours. Thyroid Function Tests: No results for input(s): TSH, T4TOTAL, FREET4, T3FREE, THYROIDAB in the last 72 hours. Anemia Panel: No results for input(s): VITAMINB12, FOLATE, FERRITIN, TIBC, IRON, RETICCTPCT in the last 72 hours. Sepsis Labs: No results for input(s): PROCALCITON, LATICACIDVEN in the last 168 hours.  No results found for this or any previous visit (from the past 240 hour(s)).       Radiology Studies: No results found.      Scheduled Meds:  (feeding supplement) PROSource Plus  30 mL Oral TID BM   sodium chloride   Intravenous Once   apixaban  5 mg Oral BID   Chlorhexidine Gluconate Cloth  6 each Topical Daily   cyanocobalamin  1,000 mcg Intramuscular Daily   diphenhydrAMINE  50 mg Intravenous Once   haloperidol lactate  0.5 mg  Intravenous QHS   megestrol  400 mg Oral Daily   mirtazapine  30 mg Oral QHS   multivitamin with minerals  1 tablet Oral Daily   OLANZapine zydis  5 mg Oral QHS   tamsulosin  0.4 mg Oral QPC supper   thiamine  100 mg Oral Daily   Vitamin D (Ergocalciferol)  50,000 Units Oral Q7 days   Continuous Infusions:  lactated ringers with kcl       LOS: 19 days    Time spent: 22 minutes    Sharen Hones, MD [facility]   To contact the attending provider between 7A-7P or the covering provider during after hours 7P-7A, please log into the web site [email or web address] and access using universal  password for that web site. If you do not have the password, please call the hospital operator.  07/08/2021, 10:16 AM

## 2021-07-09 ENCOUNTER — Inpatient Hospital Stay: Payer: Medicare Other

## 2021-07-09 ENCOUNTER — Encounter: Payer: Self-pay | Admitting: Internal Medicine

## 2021-07-09 ENCOUNTER — Inpatient Hospital Stay: Payer: Medicare Other | Admitting: Registered Nurse

## 2021-07-09 LAB — BASIC METABOLIC PANEL
Anion gap: 3 — ABNORMAL LOW (ref 5–15)
BUN: 28 mg/dL — ABNORMAL HIGH (ref 8–23)
CO2: 21 mmol/L — ABNORMAL LOW (ref 22–32)
Calcium: 9.4 mg/dL (ref 8.9–10.3)
Chloride: 115 mmol/L — ABNORMAL HIGH (ref 98–111)
Creatinine, Ser: 1.5 mg/dL — ABNORMAL HIGH (ref 0.44–1.00)
GFR, Estimated: 37 mL/min — ABNORMAL LOW (ref >60)
Glucose, Bld: 86 mg/dL (ref 70–99)
Potassium: 3.9 mmol/L (ref 3.5–5.1)
Sodium: 139 mmol/L (ref 135–145)

## 2021-07-09 LAB — CBC
HCT: 27.5 % — ABNORMAL LOW (ref 36.0–46.0)
Hemoglobin: 8.8 g/dL — ABNORMAL LOW (ref 12.0–15.0)
MCH: 29.9 pg (ref 26.0–34.0)
MCHC: 32 g/dL (ref 30.0–36.0)
MCV: 93.5 fL (ref 80.0–100.0)
Platelets: 235 10*3/uL (ref 150–400)
RBC: 2.94 MIL/uL — ABNORMAL LOW (ref 3.87–5.11)
RDW: 15.7 % — ABNORMAL HIGH (ref 11.5–15.5)
WBC: 9.3 10*3/uL (ref 4.0–10.5)
nRBC: 0 % (ref 0.0–0.2)

## 2021-07-09 LAB — MAGNESIUM: Magnesium: 1.9 mg/dL (ref 1.7–2.4)

## 2021-07-09 MED ORDER — SODIUM CHLORIDE 0.9 % IV SOLN
INTRAVENOUS | Status: DC | PRN
Start: 2021-07-09 — End: 2021-07-09

## 2021-07-09 MED ORDER — SUCCINYLCHOLINE CHLORIDE 200 MG/10ML IV SOSY
PREFILLED_SYRINGE | INTRAVENOUS | Status: DC | PRN
  Administered 2021-07-09: 80 mg via INTRAVENOUS

## 2021-07-09 MED ORDER — MIRTAZAPINE 15 MG PO TABS
45.0000 mg | ORAL_TABLET | Freq: Every day | ORAL | Status: DC
  Administered 2021-07-09 – 2021-07-13 (×5): 45 mg via ORAL
  Filled 2021-07-09 (×5): qty 3

## 2021-07-09 MED ORDER — METHOHEXITAL SODIUM 100 MG/10ML IV SOSY
PREFILLED_SYRINGE | INTRAVENOUS | Status: DC | PRN
  Administered 2021-07-09: 70 mg via INTRAVENOUS

## 2021-07-09 MED ORDER — MIDAZOLAM HCL 2 MG/2ML IJ SOLN
INTRAMUSCULAR | Status: AC
  Filled 2021-07-09: qty 2

## 2021-07-09 MED ORDER — SODIUM BICARBONATE 650 MG PO TABS
650.0000 mg | ORAL_TABLET | Freq: Two times a day (BID) | ORAL | Status: DC
  Administered 2021-07-09 – 2021-07-17 (×16): 650 mg via ORAL
  Filled 2021-07-09 (×16): qty 1

## 2021-07-09 MED ORDER — SUCCINYLCHOLINE CHLORIDE 200 MG/10ML IV SOSY
PREFILLED_SYRINGE | INTRAVENOUS | Status: AC
  Filled 2021-07-09: qty 10

## 2021-07-09 MED ORDER — MIDAZOLAM HCL 2 MG/2ML IJ SOLN
INTRAMUSCULAR | Status: DC | PRN
  Administered 2021-07-09: 2 mg via INTRAVENOUS

## 2021-07-09 MED ORDER — ONDANSETRON HCL 4 MG/2ML IJ SOLN: 4.0000 mg | Freq: Once | INTRAMUSCULAR | Status: DC | PRN

## 2021-07-09 NOTE — NC FL2 (Signed)
[place] LEVEL OF CARE SCREENING TOOL     IDENTIFICATION  Patient Name: Amber [name] Birthdate: 10/20/1945 Sex: female Admission Date (Current Location): 06/19/2021  [place] and [place] Number:  Engineering geologist and Address:  [facility], [address] [place], [place] [address]      Provider Number: [id number]  Attending Physician Name and Address:  Sharen Hones, MD  Relative Name and Phone Number:  [name] (Daughter)   [phone] [facility])    Current Level of Care: Hospital Recommended Level of Care: [place] Prior Approval Number:    Date Approved/Denied:   PASRR Number: [id number]. PASRR pending  Discharge Plan: SNF    Current Diagnoses: Patient Active Problem List   Diagnosis Date Noted   Fever    Aspiration pneumonia of right lung due to gastric secretions ([place])    Protein-calorie malnutrition, severe 06/19/2021   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[place]) 06/19/2021   Failure to thrive (child) 06/19/2021   Nasogastric tube present 06/19/2021   Chronic deep vein thrombosis (DVT) ([place]) 06/19/2021   Chronic anticoagulation 06/19/2021   ABLA (acute blood loss anemia) 06/19/2021   Acute urinary retention 06/19/2021   Foley catheter in place 06/19/2021   Stage 3a chronic kidney disease ([place]) 06/19/2021   At high risk for inadequate nutritional intake 06/19/2021   Gross hematuria 06/19/2021   MDD (major depressive disorder), single episode, severe with psychotic features ([place])    Dysphagia    COVID-19 virus infection 06/08/2021    Major depressive disorder, recurrent episode, severe ([place]) 06/09/2021   Failure to thrive in adult 06/08/2021   Hypernatremia 06/08/2021   Primary hyperparathyroidism ([place]) 06/07/2021   Hyperthyroidism 04/25/2021   Encounter for examination following treatment at hospital 04/25/2021   Gastroesophageal reflux disease 04/25/2021   Gait abnormality 03/29/2021   Urinary incontinence  03/29/2021   Moderate protein-calorie malnutrition ([place]) 03/29/2021   Depression with anxiety 03/29/2021   Aortic atherosclerosis ([place]) 03/29/2021   Adnexal mass 03/29/2021    Orientation RESPIRATION BLADDER Height & Weight     Self, Situation, Place  Normal Incontinent, External catheter Weight: 62.6 kg Height:  5\' 6"  (167.6 cm)  BEHAVIORAL SYMPTOMS/MOOD NEUROLOGICAL BOWEL NUTRITION STATUS      Incontinent Diet (Soft Diet)  AMBULATORY STATUS COMMUNICATION OF NEEDS Skin   Extensive Assist (2 person assist with walking and transfers) Verbally                         Personal Care Assistance Level of Assistance  Bathing, Feeding, Dressing Bathing Assistance: Maximum assistance (2 person assist with bathing and dressing)   Dressing Assistance: Maximum assistance (2 person assist with bathing and dressing)     Functional Limitations Info  Sight, Hearing, Speech Sight Info: Adequate Hearing Info: Adequate Speech Info: Impaired (missing teeth)    SPECIAL CARE FACTORS FREQUENCY  PT (By licensed PT), OT (By licensed OT)     PT Frequency: Min 5x weekly OT Frequency: Min 5x weekly            Contractures Contractures Info: Not present    Additional Factors Info  Code Status, Allergies Code Status Info: DNR Allergies Info: Shellfish           Current Medications (07/09/2021):  This is the current hospital active medication list Current Facility-Administered Medications  Medication Dose Route Frequency Provider Last Rate Last Admin   (feeding supplement) PROSource Plus liquid 30 mL  30 mL Oral TID BM Billie Ruddy,  Otila Kluver, MD   30 mL at 07/08/21 1527   0.9 %  sodium chloride infusion (Manually program via Guardrails IV Fluids)   Intravenous Once Athena Masse, MD       acetaminophen (TYLENOL) tablet 650 mg  650 mg Oral Q6H PRN Athena Masse, MD   650 mg at 07/05/21 2203   Or   acetaminophen (TYLENOL) suppository 650 mg  650 mg Rectal Q6H PRN Athena Masse, MD        apixaban Arne [place]) tablet 5 mg  5 mg Oral BID Sheikh, Omair Yuma, DO   5 mg at 07/08/21 2144   artificial tears (LACRILUBE) ophthalmic ointment   Both Eyes Q3H PRN Sharion Settler, NP   Given at 06/30/21 2130   Chlorhexidine Gluconate Cloth 2 % PADS 6 each  6 each Topical Daily Raiford Noble [place], DO   6 each at 07/06/21 1200   cyanocobalamin ((VITAMIN B-12)) injection 1,000 mcg  1,000 mcg Intramuscular Daily Judd Gaudier V, MD   1,000 mcg at 07/08/21 1012   diphenhydrAMINE (BENADRYL) injection 50 mg  50 mg Intravenous Once Clapacs, John T, MD       haloperidol lactate (HALDOL) injection 0.5 mg  0.5 mg Intravenous QHS Judd Gaudier V, MD   0.5 mg at 07/08/21 2250   megestrol (MEGACE) 400 MG/10ML suspension 400 mg  400 mg Oral Daily Sharen Hones, MD   400 mg at 07/08/21 1011   mirtazapine (REMERON) tablet 30 mg  30 mg Oral QHS Clapacs, Madie Reno, MD   30 mg at 07/08/21 2144   multivitamin with minerals tablet 1 tablet  1 tablet Oral Daily Enzo Bi, MD   1 tablet at 07/08/21 1012   OLANZapine zydis (ZYPREXA) disintegrating tablet 5 mg  5 mg Oral QHS Clapacs, John T, MD   5 mg at 07/08/21 2144   ondansetron (ZOFRAN) tablet 4 mg  4 mg Oral Q6H PRN Athena Masse, MD       Or   ondansetron [facility]) injection 4 mg  4 mg Intravenous Q6H PRN Athena Masse, MD   4 mg at 07/05/21 0947   sodium bicarbonate tablet 650 mg  650 mg Oral BID Sharen Hones, MD       tamsulosin [facility]) capsule 0.4 mg  0.4 mg Oral QPC supper Judd Gaudier V, MD   0.4 mg at 07/08/21 1731   thiamine tablet 100 mg  100 mg Oral Daily Enzo Bi, MD   100 mg at 07/08/21 1011   Vitamin D (Ergocalciferol) (DRISDOL) capsule 50,000 Units  50,000 Units Oral Q7 days Athena Masse, MD   50,000 Units at 07/06/21 1717     Discharge Medications: Please see discharge summary for a list of discharge medications.  Relevant Imaging Results:  Relevant Lab Results:   Additional Information SSN Tolu Lorisa Scheid, RN

## 2021-07-09 NOTE — Anesthesia Preprocedure Evaluation (Signed)
Anesthesia Evaluation  Patient identified by MRN, date of birth, ID band Patient confused    Reviewed: Allergy & Precautions, NPO status , Patient's Chart, lab work & pertinent test results  History of Anesthesia Complications Negative for: history of anesthetic complications  Airway Mallampati: III   Neck ROM: Full    Dental  (+) Edentulous Upper, Edentulous Lower   Pulmonary asthma , former smoker,  COVID 06/08/20 s/p remdesivir   Pulmonary exam normal breath sounds clear to auscultation       Cardiovascular hypertension, Normal cardiovascular exam Rhythm:Regular Rate:Normal     Neuro/Psych PSYCHIATRIC DISORDERS Anxiety Depression    GI/Hepatic GERD  ,  Endo/Other  Hyperthyroidism   Renal/GU Renal disease (stage III CKD)     Musculoskeletal   Abdominal   Peds  Hematology  (+) Blood dyscrasia, anemia , Chronic DVT on Eliquis   Anesthesia Other Findings Failure to thrive  Reproductive/Obstetrics                             Anesthesia Physical  Anesthesia Plan  ASA: 3  Anesthesia Plan: General   Post-op Pain Management:    Induction: Intravenous  PONV Risk Score and Plan: 3 and TIVA, Treatment may vary due to age or medical condition and Ondansetron  Airway Management Planned: Natural Airway  Additional Equipment:   Intra-op Plan:   Post-operative Plan:   Informed Consent: I have reviewed the patients History and Physical, chart, labs and discussed the procedure including the risks, benefits and alternatives for the proposed anesthesia with the patient or authorized representative who has indicated his/her understanding and acceptance.   Patient has DNR.  Suspend DNR and Discussed DNR with power of attorney.   Consent reviewed with POA  Plan Discussed with: CRNA  Anesthesia Plan Comments: (Serial consent obtained previously, patient does not have capacity to accept/deny  this procedure:  LMA/GETA backup discussed.  Serial consent on chart from patient's family, who was consented for risks of anesthesia including but not limited to:  - adverse reactions to medications - damage to eyes, teeth, lips or other oral mucosa - nerve damage due to positioning  - sore throat or hoarseness - damage to heart, brain, nerves, lungs, other parts of body or loss of life  Informed family about role of CRNA in peri- and intra-operative care; family voiced understanding.  Family agrees to suspend DNR for intra- and peri-operative period.)        Anesthesia Quick Evaluation

## 2021-07-09 NOTE — Progress Notes (Signed)
PT Cancellation Note  Patient Details Name: Amber [name] MRN: [id number] DOB: Oct 28, 1945   Cancelled Treatment:    Reason Eval/Treat Not Completed: Patient at procedure or test/unavailable  Pt off floor on attempt.  Will try again at a later time and/or date.   Chesley Noon 07/09/2021, 2:21 PM

## 2021-07-09 NOTE — Anesthesia Procedure Notes (Signed)
Date/Time: 07/09/2021 1:45 PM Performed by: Hedda Slade, CRNA Pre-anesthesia Checklist: Patient identified, Emergency Drugs available, Suction available and Patient being monitored Patient Re-evaluated:Patient Re-evaluated prior to induction Oxygen Delivery Method: Circle system utilized Preoxygenation: Pre-oxygenation with 100% oxygen Induction Type: IV induction Ventilation: Mask ventilation without difficulty and Mask ventilation throughout procedure Airway Equipment and Method: Bite block Placement Confirmation: positive ETCO2 Dental Injury: Teeth and Oropharynx as per pre-operative assessment

## 2021-07-09 NOTE — Anesthesia Postprocedure Evaluation (Signed)
Anesthesia Post Note  Patient: Amber [name]  Procedure(s) Performed: ECT TX  Patient location during evaluation: PACU Anesthesia Type: General Level of consciousness: awake and alert Pain management: pain level controlled Vital Signs Assessment: post-procedure vital signs reviewed and stable Respiratory status: spontaneous breathing, nonlabored ventilation, respiratory function stable and patient connected to nasal cannula oxygen Cardiovascular status: blood pressure returned to baseline and stable Postop Assessment: no apparent nausea or vomiting Anesthetic complications: no   No notable events documented.   Last Vitals:  Vitals:   07/09/21 1430 07/09/21 1440  BP: 113/74 (!) 111/58  Pulse: 96 96  Resp: 18 18  Temp:  36.7 C  SpO2: 98% 99%    Last Pain:  Vitals:   07/09/21 1440  TempSrc:   PainSc: 0-No pain                 Arita Miss

## 2021-07-09 NOTE — Procedures (Signed)
ECT SERVICES Physicians Interval Evaluation & Treatment Note  Patient Identification: Amber [name] MRN:  [id number] Date of Evaluation:  07/09/2021 TX #: 7 note  MADRS:   MMSE:   P.E. Findings:  No change to physical exam except that she is more active and her strength seems to be improving  Psychiatric Interval Note:  Affect brighter.  Eating better.  Participated in physical therapy  Subjective:  Patient is a 76 y.o. female seen for evaluation for Electroconvulsive Therapy. Patient herself has no complaints and seems to be aware of her memory deficit  Treatment Summary:   []   Right Unilateral             [x]  Bilateral   % Energy : 1.0 ms 60%   Impedance: 2260 ohms  Seizure Energy Index: 3792 V squared  Postictal Suppression Index: 10%  Seizure Concordance Index: 77%  Medications  Pre Shock: Robinul 0.1 mg Brevital 80 mg succinylcholine 100 mg  Post Shock: Versed 2 mg  Seizure Duration: 19 seconds EMG 47 seconds EEG   Comments: This may be her plateau at this point and we are probably going to look at discontinuing the ECT course and suggesting moving towards discharge planning.  I have increased her Remeron to 45 mg at night.  I will try to get in touch with family  Lungs:  [x]   Clear to auscultation               []  Other:   Heart:    [x]   Regular rhythm             []  irregular rhythm    [x]   Previous H&P reviewed, patient examined and there are NO CHANGES                 []   Previous H&P reviewed, patient examined and there are changes noted.   Alethia Berthold, MD 2/13/20235:17 PM

## 2021-07-09 NOTE — Anesthesia Postprocedure Evaluation (Signed)
Anesthesia Post Note  Patient: Amber [name]  Procedure(s) Performed: ECT TX  Patient location during evaluation: PACU Anesthesia Type: General Level of consciousness: awake and alert, oriented and patient cooperative Pain management: pain level controlled Vital Signs Assessment: post-procedure vital signs reviewed and stable Respiratory status: spontaneous breathing, nonlabored ventilation and respiratory function stable Cardiovascular status: blood pressure returned to baseline and stable Postop Assessment: adequate PO intake Anesthetic complications: no   No notable events documented.   Last Vitals:  Vitals:   07/06/21 1500 07/06/21 1510  BP: 122/77   Pulse: 98 (!) 102  Resp: 17 16  Temp: 36.6 C (!) 36.4 C  SpO2: 96%     Last Pain:  Vitals:   07/06/21 1510  TempSrc: Temporal  PainSc:                  Darrin Nipper

## 2021-07-09 NOTE — TOC Progression Note (Signed)
Transition of Care [facility]) - Progression Note    Patient Details  Name: Amber [name] MRN: [id number] Date of Birth: [birth year]  Transition of Care [facility]) CM/SW [place], RN Phone Number: 07/09/2021, 11:24 AM  Clinical Narrative:   Physical Therapy recommends rehab.  Discussed with daughter.   [name] (Daughter)  [phone] ([place])  Daughter agrees to SNF, prefers [place] area, where family lives.  PASRR Pending, bed search sent.    Expected Discharge Plan:  (TBD) Barriers to Discharge: Continued Medical Work up  Expected Discharge Plan and Services Expected Discharge Plan:  (TBD)   Discharge Planning Services: CM Consult Post Acute Care Choice:  (TBD) Living arrangements for the past 2 months: Single Family Home                                       Social Determinants of Health (SDOH) Interventions    Readmission Risk Interventions No flowsheet data found.

## 2021-07-09 NOTE — H&P (Signed)
Amber [name] is an 76 y.o. female.   Chief Complaint: Patient seen and chart reviewed.  Patient today has no specific complaint.  She reports that she is eating.  She agrees that she participated with physical therapy yesterday.  Not complaining of specific pain.  Still negativistic about the idea of being able to stand up or get back any of her strength. HPI: Patient was started with ECT because of anorexia related to what appeared to be a psychotic depression.  Has had 7 ECT treatments after today which have been tolerated well and seemed to have shown significant improvement in depressive symptoms with some predictable short-term memory problems  Past Medical History:  Diagnosis Date   Anxiety    Hypertension     History reviewed. No pertinent surgical history.  Family History  Problem Relation Age of Onset   High blood pressure Maternal Grandmother    Social History:  reports that she has quit smoking. Her smoking use included cigarettes. She has never used smokeless tobacco. She reports that she does not currently use alcohol. She reports that she does not currently use drugs.  Allergies:  Allergies  Allergen Reactions   Shellfish Allergy     "I pass out"    Medications Prior to Admission  Medication Sig Dispense Refill   cyanocobalamin (,VITAMIN B-12,) 1000 MCG/ML injection Inject 1 mL (1,000 mcg total) into the muscle daily. 1 mL 0   enoxaparin (LOVENOX) 80 MG/0.8ML injection Inject 0.65 mLs (65 mg total) into the skin every 12 (twelve) hours. 100 mL 0   haloperidol lactate (HALDOL) 5 MG/ML injection Inject 0.1 mLs (0.5 mg total) into the vein at bedtime. 1 mL 0   mirtazapine (REMERON) 7.5 MG tablet Place 1 tablet (7.5 mg total) into feeding tube at bedtime. 30 tablet 0   Multiple Vitamin (MULTIVITAMIN) LIQD Take 15 mLs by mouth daily. 1 mL 0   Nutritional Supplements (FEEDING SUPPLEMENT, OSMOLITE 1.2 CAL,) LIQD Place 1,000 mLs into feeding tube continuous. 100 mL 0   sodium  bicarbonate 650 MG tablet Place 1 tablet (650 mg total) into feeding tube 2 (two) times daily. 10 tablet 0   sodium chloride 0.9 % SOLN 50 mL with thiamine 100 MG/ML SOLN 500 mg Inject 500 mg into the vein 2 (two) times daily. 1 ampule 0   tamsulosin (FLOMAX) 0.4 MG CAPS capsule Take 1 capsule (0.4 mg total) by mouth daily after supper. 30 capsule 0   thiamine 100 MG tablet Take 1 tablet (100 mg total) by mouth daily. 1 tablet 0   Vitamin D, Ergocalciferol, (DRISDOL) 1.25 MG (50000 UNIT) CAPS capsule Take 1 capsule (50,000 Units total) by mouth every 7 (seven) days. 5 capsule 0   Water For Irrigation, Sterile (FREE WATER) SOLN Place 100 mLs into feeding tube every 4 (four) hours. 100 mL 0   acetaminophen (TYLENOL) 500 MG tablet Take 500 mg by mouth every 6 (six) hours as needed for moderate pain or mild pain.      Results for orders placed or performed during the hospital encounter of 06/19/21 (from the past 48 hour(s))  Basic metabolic panel     Status: Abnormal   Collection Time: 07/09/21  9:03 AM  Result Value Ref Range   Sodium 139 135 - 145 mmol/L   Potassium 3.9 3.5 - 5.1 mmol/L   Chloride 115 (H) 98 - 111 mmol/L   CO2 21 (L) 22 - 32 mmol/L   Glucose, Bld 86 70 - 99 mg/dL  Comment: Glucose reference range applies only to samples taken after fasting for at least 8 hours.   BUN 28 (H) 8 - 23 mg/dL   Creatinine, Ser 1.50 (H) 0.44 - 1.00 mg/dL   Calcium 9.4 8.9 - 10.3 mg/dL   GFR, Estimated 37 (L) >60 mL/min    Comment: (NOTE) Calculated using the CKD-EPI Creatinine Equation (2021)    Anion gap 3 (L) 5 - 15    Comment: Performed at [facility], [address] [place], [place] [address]  CBC     Status: Abnormal   Collection Time: 07/09/21  9:03 AM  Result Value Ref Range   WBC 9.3 4.0 - 10.5 K/uL   RBC 2.94 (L) 3.87 - 5.11 MIL/uL   Hemoglobin 8.8 (L) 12.0 - 15.0 g/dL   HCT 27.5 (L) 36.0 - 46.0 %   MCV 93.5 80.0 - 100.0 fL   MCH 29.9 26.0 - 34.0 pg   MCHC 32.0 30.0  - 36.0 g/dL   RDW 15.7 (H) 11.5 - 15.5 %   Platelets 235 150 - 400 K/uL   nRBC 0.0 0.0 - 0.2 %    Comment: Performed at [facility], [address] [place], [place] [address]  Magnesium     Status: None   Collection Time: 07/09/21  9:03 AM  Result Value Ref Range   Magnesium 1.9 1.7 - 2.4 mg/dL    Comment: Performed at [facility], [address] [place], [place] [address]   No results found.  Review of Systems  Constitutional:  Positive for fatigue.  HENT: Negative.    Eyes: Negative.   Respiratory: Negative.    Cardiovascular: Negative.   Gastrointestinal: Negative.   Musculoskeletal: Negative.   Skin: Negative.   Neurological: Negative.   Psychiatric/Behavioral:  Positive for decreased concentration. Negative for dysphoric mood.   All other systems reviewed and are negative.  Blood pressure (!) 111/58, pulse 96, temperature 98 F (36.7 C), resp. rate 18, height 5\' 6"  (1.676 m), weight 62.9 kg, SpO2 99 %. Physical Exam Vitals reviewed.  Constitutional:      Appearance: She is well-developed.  HENT:     Head: Normocephalic and atraumatic.  Eyes:     Conjunctiva/sclera: Conjunctivae normal.     Pupils: Pupils are equal, round, and reactive to light.  Cardiovascular:     Heart sounds: Normal heart sounds.  Pulmonary:     Effort: Pulmonary effort is normal.  Abdominal:     Palpations: Abdomen is soft.  Musculoskeletal:        General: Normal range of motion.     Cervical back: Normal range of motion.  Skin:    General: Skin is warm and dry.  Neurological:     Mental Status: She is alert.  Psychiatric:        Attention and Perception: Attention normal.        Mood and Affect: Mood normal.        Speech: Speech normal.        Behavior: Behavior is cooperative.        Thought Content: Thought content normal.        Cognition and Memory: Memory is impaired. She exhibits impaired recent memory.     Assessment/Plan Patient appears to be  significantly better and after I see her tomorrow I will probably have a clearer idea as to whether she has plateaued with her improvement.  I think at this point she is probably pretty close to maximum  benefit from ECT and instead should focus on physical therapy and oral intake with the very strong hope that she will regain normal strength and functioning.  I have increased the mirtazapine to 45 mg at night for maximum antidepressant benefit.  I will contact the family.  Please continue current treatment plan especially the physical therapy and feeding.  Alethia Berthold, MD 07/09/2021, 2:57 PM

## 2021-07-09 NOTE — Progress Notes (Signed)
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 Weber Cooks of psychiatry.     She was admitted to [facility] from 06/08/21-06/19/21 after presenting with failure to thrive following a several week history of complaints of difficulty swallowing with decreased oral intake and subsequent development of confusion and hallucinations.  She had outpatient work-up include brain MRI as well as MRV which was unremarkable.  On admission she is found to have an AKI and several electrolyte abnormalities.  While at [facility] she refused to eat and refused further evaluation with esophagram to evaluate her sensation of dysphagia.  She is evaluated by psychiatry who determined her to not have capacity for medical decision-making and so an NG tube was placed for force-feeding.  Palliative care consult was also done.  Due to her poor response to oral antidepressants ECG was recommended and thus arranged to transfer to [facility].  On 06/08/2020 she tested positive for COVID and she received 3 days of remdesivir.  Most of her nutritional deficiencies related to inadequate oral intake were corrected prior to transfer and on the day of discharge she developed acute urinary retention a Foley catheter was placed and it was traumatic insertion with gross development of gross hematuria.  Hemoglobin did drop to 7.0    On the afternoon of 06/21/21 she spiked a temperature of 101.9 and became tachycardic and slightly hypotensive.  She is given IV fluid bolus and she is pancultured.  Further work-up revealed that she had a right lower lobe pneumonia so she was started on  IV Unasyn and completed a course.    Given that she continues to have some mild loose stools ID felt that since the patient was positive toxin producing C. difficile empirically treat with vancomycin for 10 days.  Foley catheter has been removed.  she appeared to have a positive response to ECT.   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 Patient briefly is a followed by psychiatry, condition has been improving, last session of ECT today.   Severe protein calorie malnutrition  Anorexia. Patient initially had a Dobb[place] until 1/31 when she pulled out. Received gentle rehydration yesterday.  Continue megestrol, appetite is improving.  Aspiration pneumonia  Sepsis secondary to aspiration pneumonia Patient has completed antibiotics.   Acute blood loss anemia Vitamin B12 deficient anemia. Gross hematuria secondary to Foley catheter trauma. Thrombocytopenia. Reviewed previous labs, patient had a B12 deficiency.  Currently on B12 orally Iron level low, ferritin level elevated. Patient no longer has any hematuria.  Condition has  improved.  Urinary retention. Patient was able to urinate better.  Chronic DVT and new PE. Continue Eliquis.  Recent COVID-19 infection. Condition had improved.  Acute kidney injury. Metabolic acidosis. Hypokalemia.  Improved. Chronic kidney disease ruled out. Function relatively  stable We will add oral sodium bicarbonate.  Bicarb is slightly low today.     DVT prophylaxis: Eliquis Code Status: DNR Family Communication:  Disposition Plan:      Status is: Inpatient Remains inpatient appropriate because: Severity of disease, ECT treatment,           I/O last 3 completed shifts: In: 513.5 [I.V.:513.5] Out: 650 [Urine:650] No intake/output data recorded.     Consultants:  Psychiatry.  Procedures: ECT  Antimicrobials: None  Subjective: Patient is doing doing well, n.p.o. pending ECT today.  She slept well last night.  Currently she has no confusion.  She states that  she has a good appetite.  Objective: Vitals:   07/08/21 1554 07/09/21 0434 07/09/21 0500 07/09/21 0800  BP: 115/88 127/82  (!) 144/86  Pulse: (!) 108 (!) 110  (!) 109  Resp: 17 16  16   Temp: 98.4 F (36.9 C) 98.5 F (36.9 C)  97.8 F (36.6 C)  TempSrc: Oral   Oral  SpO2: 100% 100%  99%  Weight:   62.6 kg   Height:        Intake/Output Summary (Last 24 hours) at 07/09/2021 1054 Last data filed at 07/09/2021 0434 Gross per 24 hour  Intake 513.47 ml  Output 200 ml  Net 313.47 ml   Filed Weights   07/07/21 0500 07/08/21 0500 07/09/21 0500  Weight: 63.4 kg 63.4 kg 62.6 kg    Examination:  General exam: Appears calm and comfortable  Respiratory system: Clear to auscultation. Respiratory effort normal. Cardiovascular system: S1 & S2 heard, RRR. No JVD, murmurs, rubs, gallops or clicks. No pedal edema. Gastrointestinal system: Abdomen is nondistended, soft and nontender. No organomegaly or masses felt. Normal bowel sounds heard. Central nervous system: Alert and oriented. No focal neurological deficits. Extremities: Symmetric 5 x 5 power. Skin: No rashes, lesions or ulcers Psychiatry: Judgement and insight appear normal. Mood & affect appropriate.     Data Reviewed: I have personally reviewed following labs and imaging studies  CBC: Recent Labs  Lab  07/04/21 0434 07/06/21 0403 07/09/21 0903  WBC 5.4 4.6 9.3  HGB 8.4* 8.2* 8.8*  HCT 27.1* 25.9* 27.5*  MCV 96.4 94.9 93.5  PLT 363 290 097   Basic Metabolic Panel: Recent Labs  Lab 07/04/21 0434 07/06/21 0403 07/09/21 0903  NA 143 143 139  K 4.0 3.2* 3.9  CL 117* 114* 115*  CO2 24 24 21*  GLUCOSE 88 92 86  BUN 21 24* 28*  CREATININE 1.38* 1.56* 1.50*  CALCIUM 9.9 10.0 9.4  MG 1.9 2.2 1.9   GFR: Estimated Creatinine Clearance: 31.3 mL/min (A) (by C-G formula based on SCr of 1.5 mg/dL (H)). Liver Function Tests: No results for input(s): AST, ALT, ALKPHOS, BILITOT, PROT, ALBUMIN in the last 168 hours. No results for input(s): LIPASE, AMYLASE in the last 168 hours. No results for input(s): AMMONIA in the last 168 hours. Coagulation Profile: No results for input(s): INR, PROTIME in the last 168 hours. Cardiac Enzymes: No results for input(s): CKTOTAL, CKMB, CKMBINDEX, TROPONINI in the last 168 hours. BNP (last 3 results) No results for input(s): PROBNP in the last 8760 hours. HbA1C: No results for input(s): HGBA1C in the last 72 hours. CBG: No results for  input(s): GLUCAP in the last 168 hours. Lipid Profile: No results for input(s): CHOL, HDL, LDLCALC, TRIG, CHOLHDL, LDLDIRECT in the last 72 hours. Thyroid Function Tests: No results for input(s): TSH, T4TOTAL, FREET4, T3FREE, THYROIDAB in the last 72 hours. Anemia Panel: No results for input(s): VITAMINB12, FOLATE, FERRITIN, TIBC, IRON, RETICCTPCT in the last 72 hours. Sepsis Labs: No results for input(s): PROCALCITON, LATICACIDVEN in the last 168 hours.  No results found for this or any previous visit (from the past 240 hour(s)).       Radiology Studies: No results found.      Scheduled Meds:  (feeding supplement) PROSource Plus  30 mL Oral TID BM   sodium chloride   Intravenous Once   apixaban  5 mg Oral BID   Chlorhexidine Gluconate Cloth  6 each Topical Daily   cyanocobalamin  1,000 mcg  Intramuscular Daily   diphenhydrAMINE  50 mg Intravenous Once   haloperidol lactate  0.5 mg Intravenous QHS   megestrol  400 mg Oral Daily   mirtazapine  30 mg Oral QHS   multivitamin with minerals  1 tablet Oral Daily   OLANZapine zydis  5 mg Oral QHS   tamsulosin  0.4 mg Oral QPC supper   thiamine  100 mg Oral Daily   Vitamin D (Ergocalciferol)  50,000 Units Oral Q7 days   Continuous Infusions:   LOS: 20 days    Time spent: 27 minutes    Sharen Hones, MD [facility]   To contact the attending provider between 7A-7P or the covering provider during after hours 7P-7A, please log into the web site [email or web address] and access using universal [place] password for that web site. If you do not have the password, please call the hospital operator.  07/09/2021, 10:54 AM

## 2021-07-09 NOTE — Transfer of Care (Signed)
Immediate Anesthesia Transfer of Care Note  Patient: Amber [name]  Procedure(s) Performed: ECT TX  Patient Location: PACU  Anesthesia Type:General  Level of Consciousness: sedated  Airway & Oxygen Therapy: Patient Spontanous Breathing and Patient connected to face mask oxygen  Post-op Assessment: Report given to RN and Post -op Vital signs reviewed and stable  Post vital signs: Reviewed and stable  Last Vitals:  Vitals Value Taken Time  BP 111/64 07/09/21 1358  Temp    Pulse 99 07/09/21 1359  Resp 22 07/09/21 1359  SpO2 100 % 07/09/21 1359  Vitals shown include unvalidated device data.  Last Pain:  Vitals:   07/09/21 1323  TempSrc:   PainSc: 0-No pain         Complications: No notable events documented.

## 2021-07-09 NOTE — H&P (Signed)
Amber [name] is an 76 y.o. female.   Chief Complaint: improvement in depression HPI: finishing ECT course  Past Medical History:  Diagnosis Date   Anxiety    Hypertension     History reviewed. No pertinent surgical history.  Family History  Problem Relation Age of Onset   High blood pressure Maternal Grandmother    Social History:  reports that she has quit smoking. Her smoking use included cigarettes. She has never used smokeless tobacco. She reports that she does not currently use alcohol. She reports that she does not currently use drugs.  Allergies:  Allergies  Allergen Reactions   Shellfish Allergy     "I pass out"    Medications Prior to Admission  Medication Sig Dispense Refill   enoxaparin (LOVENOX) 80 MG/0.8ML injection Inject 0.65 mLs (65 mg total) into the skin every 12 (twelve) hours. 100 mL 0   haloperidol lactate (HALDOL) 5 MG/ML injection Inject 0.1 mLs (0.5 mg total) into the vein at bedtime. 1 mL 0   mirtazapine (REMERON) 7.5 MG tablet Place 1 tablet (7.5 mg total) into feeding tube at bedtime. 30 tablet 0   Multiple Vitamin (MULTIVITAMIN) LIQD Take 15 mLs by mouth daily. 1 mL 0   sodium bicarbonate 650 MG tablet Place 1 tablet (650 mg total) into feeding tube 2 (two) times daily. 10 tablet 0   thiamine 100 MG tablet Take 1 tablet (100 mg total) by mouth daily. 1 tablet 0   acetaminophen (TYLENOL) 500 MG tablet Take 500 mg by mouth every 6 (six) hours as needed for moderate pain or mild pain.     cyanocobalamin (,VITAMIN B-12,) 1000 MCG/ML injection Inject 1 mL (1,000 mcg total) into the muscle daily. 1 mL 0   Nutritional Supplements (FEEDING SUPPLEMENT, OSMOLITE 1.2 CAL,) LIQD Place 1,000 mLs into feeding tube continuous. 100 mL 0   sodium chloride 0.9 % SOLN 50 mL with thiamine 100 MG/ML SOLN 500 mg Inject 500 mg into the vein 2 (two) times daily. 1 ampule 0   tamsulosin (FLOMAX) 0.4 MG CAPS capsule Take 1 capsule (0.4 mg total) by mouth daily after supper. 30  capsule 0   Vitamin D, Ergocalciferol, (DRISDOL) 1.25 MG (50000 UNIT) CAPS capsule Take 1 capsule (50,000 Units total) by mouth every 7 (seven) days. 5 capsule 0   Water For Irrigation, Sterile (FREE WATER) SOLN Place 100 mLs into feeding tube every 4 (four) hours. 100 mL 0    Results for orders placed or performed during the hospital encounter of 06/19/21 (from the past 48 hour(s))  Basic metabolic panel     Status: Abnormal   Collection Time: 07/09/21  9:03 AM  Result Value Ref Range   Sodium 139 135 - 145 mmol/L   Potassium 3.9 3.5 - 5.1 mmol/L   Chloride 115 (H) 98 - 111 mmol/L   CO2 21 (L) 22 - 32 mmol/L   Glucose, Bld 86 70 - 99 mg/dL    Comment: Glucose reference range applies only to samples taken after fasting for at least 8 hours.   BUN 28 (H) 8 - 23 mg/dL   Creatinine, Ser 1.50 (H) 0.44 - 1.00 mg/dL   Calcium 9.4 8.9 - 10.3 mg/dL   GFR, Estimated 37 (L) >60 mL/min    Comment: (NOTE) Calculated using the CKD-EPI Creatinine Equation (2021)    Anion gap 3 (L) 5 - 15    Comment: Performed at [facility], [address], [place], [place] [address]  CBC  Status: Abnormal   Collection Time: 07/09/21  9:03 AM  Result Value Ref Range   WBC 9.3 4.0 - 10.5 K/uL   RBC 2.94 (L) 3.87 - 5.11 MIL/uL   Hemoglobin 8.8 (L) 12.0 - 15.0 g/dL   HCT 27.5 (L) 36.0 - 46.0 %   MCV 93.5 80.0 - 100.0 fL   MCH 29.9 26.0 - 34.0 pg   MCHC 32.0 30.0 - 36.0 g/dL   RDW 15.7 (H) 11.5 - 15.5 %   Platelets 235 150 - 400 K/uL   nRBC 0.0 0.0 - 0.2 %    Comment: Performed at [facility], [address], [place], [place] [address]  Magnesium     Status: None   Collection Time: 07/09/21  9:03 AM  Result Value Ref Range   Magnesium 1.9 1.7 - 2.4 mg/dL    Comment: Performed at [facility], [address] [place], [place] [address]   No results found.  Review of Systems  Constitutional: Negative.   HENT: Negative.    Eyes: Negative.   Respiratory: Negative.     Cardiovascular: Negative.   Gastrointestinal: Negative.   Musculoskeletal: Negative.   Skin: Negative.   Neurological: Negative.   Psychiatric/Behavioral:  Positive for confusion, decreased concentration and dysphoric mood.    Blood pressure (!) 144/86, pulse (!) 109, temperature 97.8 F (36.6 C), temperature source Oral, resp. rate 16, height 5\' 6"  (1.676 m), weight 62.6 kg, SpO2 99 %. Physical Exam Vitals and nursing note reviewed.  Constitutional:      Appearance: She is well-developed.  HENT:     Head: Normocephalic and atraumatic.  Eyes:     Conjunctiva/sclera: Conjunctivae normal.     Pupils: Pupils are equal, round, and reactive to light.  Cardiovascular:     Heart sounds: Normal heart sounds.  Pulmonary:     Effort: Pulmonary effort is normal.  Abdominal:     Palpations: Abdomen is soft.  Musculoskeletal:        General: Normal range of motion.     Cervical back: Normal range of motion.  Skin:    General: Skin is warm and dry.  Neurological:     General: No focal deficit present.     Mental Status: She is alert.  Psychiatric:        Attention and Perception: Attention normal.        Mood and Affect: Mood is depressed.        Speech: Speech is delayed.        Behavior: Behavior is slowed.        Thought Content: Thought content does not include suicidal ideation.        Cognition and Memory: Cognition is impaired.     Assessment/Plan Assess for proper date to end treatment  Alethia Berthold, MD 07/09/2021, 11:58 AM

## 2021-07-10 LAB — BASIC METABOLIC PANEL
Anion gap: 3 — ABNORMAL LOW (ref 5–15)
BUN: 29 mg/dL — ABNORMAL HIGH (ref 8–23)
CO2: 21 mmol/L — ABNORMAL LOW (ref 22–32)
Calcium: 9.6 mg/dL (ref 8.9–10.3)
Chloride: 118 mmol/L — ABNORMAL HIGH (ref 98–111)
Creatinine, Ser: 1.41 mg/dL — ABNORMAL HIGH (ref 0.44–1.00)
GFR, Estimated: 39 mL/min — ABNORMAL LOW (ref >60)
Glucose, Bld: 85 mg/dL (ref 70–99)
Potassium: 3.8 mmol/L (ref 3.5–5.1)
Sodium: 142 mmol/L (ref 135–145)

## 2021-07-10 NOTE — Progress Notes (Signed)
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 Weber Cooks of psychiatry.     She was admitted to [facility] from 06/08/21-06/19/21 after presenting with failure to thrive following a several week history of complaints of difficulty swallowing with decreased oral intake and subsequent development of confusion and hallucinations.  She had outpatient work-up include brain MRI as well as MRV which was unremarkable.  On admission she is found to have an AKI and several electrolyte abnormalities.  While at [facility] she refused to eat and refused further evaluation with esophagram to evaluate her sensation of dysphagia.  She is evaluated by psychiatry who determined her to not have capacity for medical decision-making and so an NG tube was placed for force-feeding.  Palliative care consult was also done.  Due to her poor response to oral antidepressants ECG was recommended and thus arranged to transfer to [facility].  On 06/08/2020 she tested positive for COVID and she received 3 days of remdesivir.  Most of her nutritional deficiencies related to inadequate oral intake were corrected prior to transfer and on the day of discharge she developed acute urinary retention a Foley catheter was placed and it was traumatic insertion with gross development of gross hematuria.  Hemoglobin did drop to 7.0    On the afternoon of 06/21/21 she spiked a temperature of 101.9 and became tachycardic and slightly hypotensive.  She is given IV fluid bolus and she is pancultured.  Further work-up revealed that she had a right lower lobe pneumonia so she was started on  IV Unasyn and completed a course.    Given that she continues to have some mild loose stools ID felt that since the patient was positive toxin producing C. difficile empirically treat with vancomycin for 10 days.  Foley catheter has been removed.  she appeared to have a positive response to ECT.   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 Patient condition much improved, last session ECT today, no additional treatment is needed per psychiatry.     Severe protein calorie malnutrition  Anorexia. Patient initially had a Dobbhoff until 1/31 when she pulled out. This is partially due to severe depression, she is also treated with megestrol.  Her appetite has been improving.  Aspiration pneumonia  Sepsis secondary to aspiration pneumonia Patient has completed antibiotics.   Acute blood loss anemia Vitamin B12 deficient anemia. Gross hematuria secondary to Foley catheter trauma. Thrombocytopenia. Reviewed previous labs, patient had a B12 deficiency.  Currently on B12 orally Iron level low, ferritin  level elevated. Patient no longer has any hematuria.   Urinary retention. Patient no longer has any urinary retention.  Chronic DVT and new PE. Continue Eliquis.  Recent COVID-19 infection. Condition had improved.  Acute kidney injury. Metabolic acidosis. Hypokalemia.  Improved. Chronic kidney disease ruled  out. Renal function relatively stable, prior renal function was normal. We will continue oral sodium bicarbonate.    DVT prophylaxis: Eliquis Code Status: DNR Family Communication:  Disposition Plan:      Status is: Inpatient Remains inpatient appropriate because: Severity of disease, ECT treatment,            I/O last 3 completed shifts: In: -  Out: 400 [Urine:400] No intake/output data recorded.     Consultants:  Psychiatry.  Procedures: None  Antimicrobials: none  Subjective: Patient condition much improved.  She has appetite, no nausea vomiting.   Objective: Vitals:   07/09/21 1648 07/09/21 2017 07/10/21 0514 07/10/21 0847  BP: (!) 99/59 135/78 (!) 113/58 134/82  Pulse: 94 (!) 108 78 (!) 108  Resp: 16 20 17 19   Temp: 98.3 F (36.8 C) 97.7 F (36.5 C) 98.2 F (36.8 C) 97.7 F (36.5 C)  TempSrc: Oral Oral  Oral  SpO2: 100% 100% 98% 98%  Weight:      Height:        Intake/Output Summary (Last 24 hours) at 07/10/2021 0942 Last data filed at 07/10/2021 0513 Gross per 24 hour  Intake --  Output 200 ml  Net -200 ml   Filed Weights   07/08/21 0500 07/09/21 0500 07/09/21 1321  Weight: 63.4 kg 62.6 kg 62.9 kg    Examination:  General exam: Appears calm and comfortable  Respiratory system: Clear to auscultation. Respiratory effort normal. Cardiovascular system: S1 & S2 heard, RRR. No JVD, murmurs, rubs, gallops or clicks. No pedal edema. Gastrointestinal system: Abdomen is nondistended, soft and nontender. No organomegaly or masses felt. Normal bowel sounds heard. Central nervous system: Alert and oriented x3. No focal neurological deficits. Extremities: Symmetric 5 x 5 power. Skin: No rashes, lesions or ulcers Psychiatry: Judgement and insight appear normal. Mood & affect appropriate.     Data Reviewed: I have personally reviewed following labs and imaging studies  CBC: Recent Labs  Lab 07/04/21 0434 07/06/21 0403 07/09/21 0903  WBC 5.4  4.6 9.3  HGB 8.4* 8.2* 8.8*  HCT 27.1* 25.9* 27.5*  MCV 96.4 94.9 93.5  PLT 363 290 101   Basic Metabolic Panel: Recent Labs  Lab 07/04/21 0434 07/06/21 0403 07/09/21 0903 07/10/21 0446  NA 143 143 139 142  K 4.0 3.2* 3.9 3.8  CL 117* 114* 115* 118*  CO2 24 24 21* 21*  GLUCOSE 88 92 86 85  BUN 21 24* 28* 29*  CREATININE 1.38* 1.56* 1.50* 1.41*  CALCIUM 9.9 10.0 9.4 9.6  MG 1.9 2.2 1.9  --    GFR: Estimated Creatinine Clearance: 33.3 mL/min (A) (by C-G formula based on SCr of 1.41 mg/dL (H)). Liver Function Tests: No results for input(s): AST, ALT, ALKPHOS, BILITOT, PROT, ALBUMIN in the last 168 hours. No results for input(s): LIPASE, AMYLASE in the last 168 hours. No results for input(s): AMMONIA in the last 168 hours. Coagulation Profile: No results for input(s): INR, PROTIME in the last 168 hours. Cardiac Enzymes: No results for input(s): CKTOTAL, CKMB, CKMBINDEX, TROPONINI in the last 168 hours. BNP (last 3 results) No results for input(s): PROBNP in the last 8760 hours. HbA1C: No results for input(s): HGBA1C in the last 72  hours. CBG: No results for input(s): GLUCAP in the last 168 hours. Lipid Profile: No results for input(s): CHOL, HDL, LDLCALC, TRIG, CHOLHDL, LDLDIRECT in the last 72 hours. Thyroid Function Tests: No results for input(s): TSH, T4TOTAL, FREET4, T3FREE, THYROIDAB in the last 72 hours. Anemia Panel: No results for input(s): VITAMINB12, FOLATE, FERRITIN, TIBC, IRON, RETICCTPCT in the last 72 hours. Sepsis Labs: No results for input(s): PROCALCITON, LATICACIDVEN in the last 168 hours.  No results found for this or any previous visit (from the past 240 hour(s)).       Radiology Studies: No results found.      Scheduled Meds:  (feeding supplement) PROSource Plus  30 mL Oral TID BM   sodium chloride   Intravenous Once   apixaban  5 mg Oral BID   Chlorhexidine Gluconate Cloth  6 each Topical Daily   cyanocobalamin  1,000 mcg  Intramuscular Daily   diphenhydrAMINE  50 mg Intravenous Once   haloperidol lactate  0.5 mg Intravenous QHS   megestrol  400 mg Oral Daily   mirtazapine  45 mg Oral QHS   multivitamin with minerals  1 tablet Oral Daily   OLANZapine zydis  5 mg Oral QHS   sodium bicarbonate  650 mg Oral BID   tamsulosin  0.4 mg Oral QPC supper   thiamine  100 mg Oral Daily   Vitamin D (Ergocalciferol)  50,000 Units Oral Q7 days   Continuous Infusions:   LOS: 21 days    Time spent: 25 minutes    Sharen Hones, MD [facility]   To contact the attending provider between 7A-7P or the covering provider during after hours 7P-7A, please log into the web site [email or web address] and access using universal [place] password for that web site. If you do not have the password, please call the hospital operator.  07/10/2021, 9:42 AM

## 2021-07-10 NOTE — Progress Notes (Signed)
Nutrition Follow-up  DOCUMENTATION CODES:   Severe malnutrition in context of chronic illness  INTERVENTION:   -D/c Prosource -Continue MVI with minerals daily -Continue Hormel Shake TID with meals, each supplement provides 520 kcals and 22 grams protein -Continue feeding assistance with meals  NUTRITION DIAGNOSIS:   Severe Malnutrition related to chronic illness (major depressive disorder) as evidenced by percent weight loss, severe fat depletion, severe muscle depletion.  Ongoing  GOAL:   Patient will meet greater than or equal to 90% of their needs  Progressing   MONITOR:   PO intake, Supplement acceptance, Labs, Weight trends, Skin, I & O's  REASON FOR ASSESSMENT:   Consult Enteral/tube feeding initiation and management  ASSESSMENT:   77 yo female with a PMH of HTN, CKD stage 3b, vitamin D deficiency 2/2 CKD, secondary hyperparathyroidism admitted with hypernatremia 2/2 FTT in the setting of depression/MDD. Transfer[facility] from [facility] to [facility] for consideration of ECT.  1/31- NGT removed 2/1- advanced to regular diet 2/8- megace started by MD  Reviewed I/O's: -200 ml x 24 hours and -1.7 L since 06/26/21  UOP: 200 ml x 24 hours   Per psychiatry notes, possible plan to d/c ECT.   Pt sitting up in bed, more alert than this RD has been her this admission. When asked if she is feeling better, pt reports "I don't know". She shares that her appetite has improved and consumed eggs, pancakes, and coffee for breakfast. She denies difficulty chewing or swallowing foods.   Pt shares that she feels very weak, but has been working with therapy. Discussed importance of good meal completions to support progress with therapy.   Per TOC notes, plan for SNF once medically stable for discharge.   Medications reviewed and include thiamine and vitamin D.  Labs reviewed: CBGS: 79-169.    Diet Order:   Diet Order             Diet regular Room service appropriate? Yes; Fluid  consistency: Thin  Diet effective now                   EDUCATION NEEDS:   Not appropriate for education at this time  Skin:  Skin Assessment: Reviewed RN Assessment  Last BM:  07/09/21  Height:   Ht Readings from Last 1 Encounters:  07/09/21 5\' 6"  (1.676 m)    Weight:   Wt Readings from Last 1 Encounters:  07/09/21 62.9 kg   BMI:  Body mass index is 22.38 kg/m.  Estimated Nutritional Needs:   Kcal:  0947-0962  Protein:  85-100 grams  Fluid:  >1.75 L    Loistine Chance, RD, LDN, [place] Registered Dietitian II Certified Diabetes Care and Education Specialist Please refer to [facility] for RD and/or RD on-call/weekend/after hours pager

## 2021-07-10 NOTE — Progress Notes (Signed)
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 Weber Cooks of psychiatry.  Admitted at [facility] from 06/08/21-06/19/21 after presenting with failure to thrive following a several week history of complaints of difficulty swallowing with decreased oral intake and subsequent development of confusion and hallucinations.    PT Comments    Patient agreeable to PT with encouragement and is making progress with participation and increased activity tolerance. She was able to stand with assistance today for ~ 15 seconds. Min A required for standing balance. She self limits further activity with complains of LE numbness from knee distally bilaterally and feels this is limiting her for ambulation attempts. She also participate with UE and LE therapeutic exercises for strengthening. Recommend to continue PT to maximize independence and facilitate return to prior level of function.    Recommendations for follow up therapy are one component of a multi-disciplinary discharge planning process, led by the attending physician.  Recommendations may be updated based on patient status, additional functional criteria and insurance authorization.  Follow Up Recommendations  Skilled nursing-short term rehab (<3 hours/day)     Assistance Recommended at Discharge Frequent or constant Supervision/Assistance  Patient can return home with the following A lot of help with walking and/or transfers;A lot of help with bathing/dressing/bathroom;Assist for transportation;Help with stairs or ramp for entrance   Equipment Recommendations   (to be  determined at next level of care)    Recommendations for Other Services       Precautions / Restrictions Precautions Precautions: Fall Restrictions Weight Bearing Restrictions: No     Mobility  Bed Mobility Overal bed mobility: Needs Assistance Bed Mobility: Supine to Sit, Sit to Supine     Supine to sit: HOB elevated, Modified independent (Device/Increase time) Sit to supine: Supervision, HOB elevated   General bed mobility comments: increased time required to complete bed mobility tasks, but no physical assistance is needed    Transfers Overall transfer level: Needs assistance Equipment used: None Transfers: Sit to/from Stand Sit to Stand: Max assist           General transfer comment: verbal cues for hand placement and safety. lifting and lowering assistance provided. one standing bout performed and patient declined further standing bouts    Ambulation/Gait                   Stairs             Wheelchair Mobility    Modified Rankin (Stroke Patients Only)       Balance Overall balance assessment: Needs assistance Sitting-balance support: Feet unsupported, No upper extremity supported Sitting balance-Leahy Scale: Good Sitting balance - Comments: no loss of balance when performing LE exercises in sitting position.   Standing balance support: Single extremity supported Standing balance-Leahy Scale: Poor Standing balance comment: Min A required to maintain standing balance with standing tolerance of ~ 15 seconds                            Cognition Arousal/Alertness: Awake/alert Behavior During Therapy: Flat affect Overall Cognitive Status: No family/caregiver present to determine baseline cognitive functioning  General Comments: patient remembers seeing this therapist on Sunday. she is cooperative with encouragement and able to follow single step commands without difficulty         Exercises General Exercises - Upper Extremity Shoulder Flexion: Both, 10 reps, Supine (flexion to 90 degrees) Elbow Flexion: AROM, Strengthening, Both, 10 reps, Supine General Exercises - Lower Extremity Ankle Circles/Pumps: AROM, Strengthening, Both, 10 reps, Supine (verbal cues and demonstration for technique) Long Arc Quad: AROM, AAROM, Strengthening, Both, Supine, 20 reps (AAROM x 10 reps and AROM x 10 reps each in sitting) Straight Leg Raises: AAROM, Strengthening, Both, 10 reps, Supine Other Exercises Other Exercises: cues provided for exercise technique    General Comments General comments (skin integrity, edema, etc.): Sp02 99% on room air. heart rate in the 130's after standing      Pertinent Vitals/Pain Pain Assessment Pain Assessment: No/denies pain    Home Living                          Prior Function            PT Goals (current goals can now be found in the care plan section) Acute Rehab PT Goals Patient Stated Goal: to have less numbness in her feet and legs PT Goal Formulation: With patient Time For Goal Achievement: 07/22/21 Potential to Achieve Goals: Fair Progress towards PT goals: Progressing toward goals    Frequency    Min 2X/week      PT Plan Current plan remains appropriate    Co-evaluation              AM-PAC PT "6 Clicks" Mobility   Outcome Measure  Help needed turning from your back to your side while in a flat bed without using bedrails?: A Little Help needed moving from lying on your back to sitting on the side of a flat bed without using bedrails?: A Little Help needed moving to and from a bed to a chair (including a wheelchair)?: A Lot Help needed standing up from a chair using your arms (e.g., wheelchair or bedside chair)?: A Lot Help needed to walk in hospital room?: A Lot Help needed climbing 3-5 steps with a railing? : Total 6 Click Score: 13    End of Session   Activity Tolerance: Patient tolerated  treatment well Patient left: in bed;with call bell/phone within reach;with bed alarm set (talking with her spouse on the phone) Nurse Communication: Mobility status PT Visit Diagnosis: Other abnormalities of gait and mobility (R26.89);Muscle weakness (generalized) (M62.81)     Time: 7371-0626 PT Time Calculation (min) (ACUTE ONLY): 19 min  Charges:  $Therapeutic Activity: 8-22 mins                     Minna Merritts, PT, MPT    Percell Locus 07/10/2021, 10:28 AM

## 2021-07-10 NOTE — TOC Progression Note (Signed)
Transition of Care [facility]) - Progression Note    Patient Details  Name: Amber [name] MRN: [id number] Date of Birth: 03-Jun-1945  Transition of Care [facility]) CM/SW [place], RN Phone Number: 07/10/2021, 3:41 PM  Clinical Narrative:  At this time, the only bed offer is from [place] in [place].  Daughter stated she would like [place] [facility] stated that they cannot offer a bed at this time.  Family stated they wanted [facility] or [place] facilities. Bed search extended, awaiting bed offers.  Toc to follow.     Expected Discharge Plan:  (TBD) Barriers to Discharge: Continued Medical Work up  Expected Discharge Plan and Services Expected Discharge Plan:  (TBD)   Discharge Planning Services: CM Consult Post Acute Care Choice:  (TBD) Living arrangements for the past 2 months: Single Family Home                                       Social Determinants of Health (SDOH) Interventions    Readmission Risk Interventions No flowsheet data found.

## 2021-07-11 DIAGNOSIS — E440 Moderate protein-calorie malnutrition: Secondary | ICD-10-CM

## 2021-07-11 LAB — BASIC METABOLIC PANEL
Anion gap: 3 — ABNORMAL LOW (ref 5–15)
BUN: 30 mg/dL — ABNORMAL HIGH (ref 8–23)
CO2: 22 mmol/L (ref 22–32)
Calcium: 9.6 mg/dL (ref 8.9–10.3)
Chloride: 116 mmol/L — ABNORMAL HIGH (ref 98–111)
Creatinine, Ser: 1.5 mg/dL — ABNORMAL HIGH (ref 0.44–1.00)
GFR, Estimated: 37 mL/min — ABNORMAL LOW (ref >60)
Glucose, Bld: 85 mg/dL (ref 70–99)
Potassium: 3.5 mmol/L (ref 3.5–5.1)
Sodium: 141 mmol/L (ref 135–145)

## 2021-07-11 MED ORDER — VITAMIN B-12 1000 MCG PO TABS
1000.0000 ug | ORAL_TABLET | Freq: Every day | ORAL | Status: DC
  Administered 2021-07-12 – 2021-07-17 (×6): 1000 ug via ORAL
  Filled 2021-07-11 (×6): qty 1

## 2021-07-11 MED ORDER — METOPROLOL SUCCINATE ER 25 MG PO TB24
12.5000 mg | ORAL_TABLET | Freq: Every day | ORAL | Status: DC
  Administered 2021-07-11 – 2021-07-12 (×2): 12.5 mg via ORAL
  Filled 2021-07-11 (×2): qty 1

## 2021-07-11 NOTE — Progress Notes (Signed)
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  Continue Zyprexa and Remeron.  COVID-19 virus infection 06/08/2021 Completed isolation here in the hospital.  Moderate protein-calorie malnutrition ([place])- (present on admission) Patient on Megace and Remeron  Aspiration pneumonia of right lung due to gastric secretions [facility]) Completed treatment during the hospital course.  Stage 3a chronic kidney disease (HCC) Last creatinine 1.5 with a GFR of 37.  ABLA (acute blood loss anemia) Last hemoglobin 8.8.  Vitamin B12 level also low at 135.  Continue B12 supplementation.  Acute urinary retention Patient had Foley catheter during the hospital course.  This has been discontinued.  Gross hematuria CT scan and ultrasound showed nonobstructive kidney stones.  Can consider outpatient urology.  Chronic deep vein thrombosis (DVT) (HCC) Patient on Eliquis.        Subjective: Patient feels okay.  Offers no complaints.  States she is very weak.  No nausea or vomiting.  Physical Exam: Vitals:   07/11/21 0443 07/11/21 0500 07/11/21 0754 07/11/21 1515  BP: 107/73  109/63 (!) 126/94  Pulse: 94  97 (!) 110  Resp: 16  20 20   Temp: 98.5 F (36.9 C)  99 F (37.2 C) 98.4 F (36.9 C)  TempSrc: Oral  Oral Oral  SpO2: 98%  98% 100%  Weight:  63.9 kg    Height:       Physical Exam HENT:     Head: Normocephalic.     Mouth/Throat:     Pharynx: No oropharyngeal exudate.  Eyes:     General: Lids are normal.     Conjunctiva/sclera: Conjunctivae normal.  Cardiovascular:     Rate and Rhythm: Normal rate and regular rhythm.     Heart sounds: Normal heart sounds, S1 normal and S2 normal.  Pulmonary:     Breath sounds: Normal breath sounds. No decreased breath sounds, wheezing, rhonchi or  rales.  Abdominal:     Palpations: Abdomen is soft.     Tenderness: There is no abdominal tenderness.  Musculoskeletal:     Right lower leg: No swelling.     Left lower leg: No swelling.  Skin:    General: Skin is warm.     Findings: No rash.  Neurological:     Mental Status: She is alert and oriented to person, place, and time.     Data Reviewed: Laboratory radiological data reviewed.  Family Communication: Spoke with patient's daughter on the phone  Disposition: Status is: Inpatient Remains inpatient appropriate because: The patient is too weak to go home at this point.  Needs rehab.  Planned Discharge Destination: Rehab  Case discussed with transitional care team and psychiatry.  Author: Loletha Grayer, MD 07/11/2021 3:19 PM  For on call review [email or web address].

## 2021-07-11 NOTE — Assessment & Plan Note (Addendum)
Patient on Remeron.  Megace discontinued because she did not like the taste of this medication.

## 2021-07-11 NOTE — Assessment & Plan Note (Signed)
Completed isolation here in the hospital.

## 2021-07-11 NOTE — Progress Notes (Signed)
Need a rehab bed

## 2021-07-11 NOTE — TOC Progression Note (Signed)
Transition of Care [facility]) - Progression Note    Patient Details  Name: Amber [name] MRN: [id number] Date of Birth: 12-28-1945  Transition of Care [facility]) CM/SW [place], RN Phone Number: 07/11/2021, 1:52 PM  Clinical Narrative:   RNCM spoke to daughter, communicated SNF bed offers, [facility] in [place], [facility] in [place], [facility] in [place], and [facility] in [place].  Daughter is discussing and looking into these and will contact me with their decision.  TOC to follow.    Expected Discharge Plan:  (TBD) Barriers to Discharge: Continued Medical Work up  Expected Discharge Plan and Services Expected Discharge Plan:  (TBD)   Discharge Planning Services: CM Consult Post Acute Care Choice:  (TBD) Living arrangements for the past 2 months: Single Family Home                                       Social Determinants of Health (SDOH) Interventions    Readmission Risk Interventions No flowsheet data found.

## 2021-07-11 NOTE — Progress Notes (Signed)
PT Cancellation Note  Patient Details Name: Amber [name] MRN: [id number] DOB: 1945/10/01   Cancelled Treatment:     PT attempt 2 x this AM. Pt politely refused. Will continue efforts to progress pt towards PT goals.    Willette Pa 07/11/2021, 11:29 AM

## 2021-07-11 NOTE — Assessment & Plan Note (Addendum)
Last hemoglobin 8.3.  Vitamin B12 level also low at 135.  Continue B12 supplementation.  I gave IV iron a couple days ago.

## 2021-07-11 NOTE — Assessment & Plan Note (Signed)
Patient on Eliquis

## 2021-07-11 NOTE — Assessment & Plan Note (Addendum)
CT scan and ultrasound showed nonobstructive kidney stones.  Outpatient urology follow-up.

## 2021-07-11 NOTE — Assessment & Plan Note (Addendum)
Last creatinine 1.52.  Continue sodium bicarb.

## 2021-07-11 NOTE — Assessment & Plan Note (Addendum)
Patient had Foley catheter during the hospital course.  This has been discontinued. The Pseudomonas UTI was not treated secondary to the patient having a Foley catheter at that time.

## 2021-07-11 NOTE — Assessment & Plan Note (Addendum)
Completed ECT treatment.  Continue Zyprexa and Remeron.  With the jitteriness, I decreased Remeron dose down to 30 mg.  Medically stable for discharge. Pasrr paperwork obtained yesterday late afternoon..  Patient very anxious at this point with a lot of somatic complaints including nausea and jitteriness not feeling well and trouble swallowing.  Patient needs reassurance.

## 2021-07-11 NOTE — Assessment & Plan Note (Signed)
Completed treatment during the hospital course.

## 2021-07-11 NOTE — Progress Notes (Signed)
Physical Therapy Treatment Patient Details Name: Amber [name] MRN: [id number] DOB: 26-Aug-1945 Today's Date: 07/11/2021   History of Present Illness Patient is a  76 year old Caucasian female with a past medical history significant for CKD stage IIIa, hypertension, vitamin D deficiency, chronic DVT on Eliquis, history of psychotic depression with refusal to eat who was admitted as a transfer from [facility] for ECT by Dr. Weber Cooks of psychiatry.  Admitted at [facility] from 06/08/21-06/19/21 after presenting with failure to thrive following a several week history of complaints of difficulty swallowing with decreased oral intake and subsequent development of confusion and hallucinations.    PT Comments    Pt agreeable to PT with max encouragement on 3rd attempt. She was A but lack insight of her abilities. Pt can do much more than she realizes and is willing to perform. Pt's anxiety greatly impacts session progression.  With max encouragement, was able to exit bed, stand at EOB and even march in place. Unwilling to ambulate away from EOB due to anxiety. When encouraged she quickly returns to bed and was unwilling to perform more activity. Pt's cognition/ behaviors greatly limit session progression. Recommend DC to STR to address deficits while maximizing independence with ADLs.    Recommendations for follow up therapy are one component of a multi-disciplinary discharge planning process, led by the attending physician.  Recommendations may be updated based on patient status, additional functional criteria and insurance authorization.  Follow Up Recommendations  Skilled nursing-short term rehab (<3 hours/day)     Assistance Recommended at Discharge Frequent or constant Supervision/Assistance  Patient can return home with the following A lot of help with walking and/or transfers;A lot of help with bathing/dressing/bathroom;Assist for transportation;Help with stairs or ramp for entrance    Equipment Recommendations  Rolling walker (2 wheels);Hospital bed;Wheelchair cushion (measurements PT)       Precautions / Restrictions Precautions Precautions: Fall Precaution Comments: Covid Prec Restrictions Weight Bearing Restrictions: No     Mobility  Bed Mobility Overal bed mobility: Needs Assistance Bed Mobility: Supine to Sit, Sit to Supine     Supine to sit: HOB elevated, Modified independent (Device/Increase time) Sit to supine: Supervision, HOB elevated   General bed mobility comments: Pt was able to exit L side of bed with increased time and max encouragement. no physical assistance required.    Transfers Overall transfer level: Needs assistance Equipment used: Rolling walker (2 wheels) Transfers: Sit to/from Stand Sit to Stand: Min assist, From elevated surface  General transfer comment: pt stood 3 x EOB. Was able to lift R/L LE with encouragement but was unwilling to ambulate away from EOB.    Ambulation/Gait    General Gait Details: Pt was unwilling to ambulate even with max encouragement. Does demonstrate physical strength and abilities to ambulate.    Balance Overall balance assessment: Needs assistance Sitting-balance support: Feet unsupported, No upper extremity supported Sitting balance-Leahy Scale: Good     Standing balance support: Bilateral upper extremity supported, Reliant on assistive device for balance Standing balance-Leahy Scale: Fair       Cognition Arousal/Alertness: Awake/alert Behavior During Therapy: Anxious Overall Cognitive Status: No family/caregiver present to determine baseline cognitive functioning      General Comments: Pt is A and able to follow commands consist throughout however requires constant encouragement and motivation for full participation. Pt has poor insight of deficits but is able to do alot more than she thinks she can.  Pertinent Vitals/Pain Pain Assessment Pain Assessment: No/denies  pain     PT Goals (current goals can now be found in the care plan section) Acute Rehab PT Goals Patient Stated Goal: get better so I can be home with my husband. Progress towards PT goals: Progressing toward goals    Frequency    Min 2X/week      PT Plan Current plan remains appropriate       AM-PAC PT "6 Clicks" Mobility   Outcome Measure  Help needed turning from your back to your side while in a flat bed without using bedrails?: A Little Help needed moving from lying on your back to sitting on the side of a flat bed without using bedrails?: A Little Help needed moving to and from a bed to a chair (including a wheelchair)?: A Little Help needed standing up from a chair using your arms (e.g., wheelchair or bedside chair)?: A Little Help needed to walk in hospital room?: A Little Help needed climbing 3-5 steps with a railing? : A Little 6 Click Score: 18    End of Session   Activity Tolerance: Patient tolerated treatment well Patient left: in bed;with call bell/phone within reach;with bed alarm set Nurse Communication: Mobility status PT Visit Diagnosis: Other abnormalities of gait and mobility (R26.89);Muscle weakness (generalized) (M62.81)     Time: 7096-4383 PT Time Calculation (min) (ACUTE ONLY): 25 min  Charges:  $Therapeutic Activity: 23-37 mins                     Julaine Fusi PTA 07/11/21, 12:34 PM

## 2021-07-12 MED ORDER — BOOST / RESOURCE BREEZE PO LIQD CUSTOM
1.0000 | Freq: Three times a day (TID) | ORAL | Status: DC
  Administered 2021-07-13 – 2021-07-17 (×2): 1 via ORAL

## 2021-07-12 NOTE — Progress Notes (Signed)
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  Continue Zyprexa and Remeron.  Medically stable for discharge.  COVID-19 virus infection 06/08/2021 Completed isolation here in the hospital.  Moderate protein-calorie malnutrition ([place])- (present on admission) Patient on Megace and Remeron  Aspiration pneumonia of right lung due to gastric secretions [facility]) Completed treatment during the hospital course.  Stage 3a chronic kidney disease (HCC) Last creatinine 1.5 with a GFR of 37.  ABLA (acute blood loss anemia) Last hemoglobin 8.8.  Vitamin B12 level also low at 135.  Continue B12 supplementation.  Acute urinary retention Patient had Foley catheter during the hospital course.  This has been discontinued.  Gross hematuria CT scan and ultrasound showed nonobstructive kidney stones.  Can consider outpatient urology.  Chronic deep vein thrombosis (DVT) (HCC) Patient on Eliquis.        Subjective: Patient seen before breakfast.  She was not hungry this morning when I saw her.  Offers no complaints.  On the medical service 22 days.  Physical Exam: Vitals:   07/12/21 0018 07/12/21 0500 07/12/21 0512 07/12/21 0748  BP: 123/80  106/61 (!) 95/54  Pulse: 99  81 83  Resp: 20  18 14   Temp: 98.2 F (36.8 C)  98.1 F (36.7 C) 99.2 F (37.3 C)  TempSrc:   Oral Oral  SpO2: 99%  100% 100%  Weight:  64.1 kg    Height:       Physical Exam HENT:     Head: Normocephalic.     Mouth/Throat:     Pharynx: No oropharyngeal exudate.  Eyes:     General: Lids are normal.     Conjunctiva/sclera: Conjunctivae normal.  Cardiovascular:     Rate and Rhythm: Normal rate and regular rhythm.     Heart sounds: Normal heart sounds, S1 normal and S2 normal.  Pulmonary:     Breath sounds: Normal  breath sounds. No decreased breath sounds, wheezing, rhonchi or rales.  Abdominal:     Palpations: Abdomen is soft.     Tenderness: There is no abdominal tenderness.  Musculoskeletal:     Right lower leg: No swelling.     Left lower leg: No swelling.  Skin:    General: Skin is warm.     Findings: No rash.  Neurological:     Mental Status: She is alert.     Comments: Patient able to straight leg raise bilaterally.     Data Reviewed:  There are no new results to review at this time.  Disposition: Status is: Inpatient Remains inpatient appropriate because: Accepted to rehab facility for tomorrow.  Planned Discharge Destination: Rehab   Author: Loletha Grayer, MD 07/12/2021 3:21 PM  For on call review [email or web address].

## 2021-07-12 NOTE — TOC Progression Note (Addendum)
Transition of Care [facility]) - Progression Note    Patient Details  Name: Amber [name] MRN: [id number] Date of Birth: [birth year]  Transition of Care [facility]) CM/SW [place], RN Phone Number: 07/12/2021, 3:16 PM  Clinical Narrative:   Daughter states family chose [facility] in [place].  As per [facility], patient can transfer tomorrow.  Family aware.  TOC to follow.  Addendum 1601:  patient will transport by [facility] at 1pm tomorrow   Expected Discharge Plan:  (TBD) Barriers to Discharge: Continued Medical Work up  Expected Discharge Plan and Services Expected Discharge Plan:  (TBD)   Discharge Planning Services: CM Consult Post Acute Care Choice:  (TBD) Living arrangements for the past 2 months: Single Family Home                                       Social Determinants of Health (SDOH) Interventions    Readmission Risk Interventions No flowsheet data found.

## 2021-07-12 NOTE — Progress Notes (Signed)
Nutrition Follow-up  DOCUMENTATION CODES:   Severe malnutrition in context of chronic illness  INTERVENTION:   -Continue MVI with minerals daily -D/c Hormel Shake TID with meals, each supplement provides 520 kcals and 22 grams protein -Continue feeding assistance with meals -Boost Breeze po TID, each supplement provides 250 kcal and 9 grams of protein   NUTRITION DIAGNOSIS:   Severe Malnutrition related to chronic illness (major depressive disorder) as evidenced by percent weight loss, severe fat depletion, severe muscle depletion.  Ongoing  GOAL:   Patient will meet greater than or equal to 90% of their needs  Progressing   MONITOR:   PO intake, Supplement acceptance, Labs, Weight trends, Skin, I & O's  REASON FOR ASSESSMENT:   Consult Enteral/tube feeding initiation and management  ASSESSMENT:   76 yo female with a PMH of HTN, CKD stage 3b, vitamin D deficiency 2/2 CKD, secondary hyperparathyroidism admitted with hypernatremia 2/2 FTT in the setting of depression/MDD. Transferred from [facility] to [facility] for consideration of ECT.  Reviewed I/O's: -3.9 L since 06/28/21  Pt sitting up in bed at time of visit. She reports that she got a good night's sleep, but did not eat breakfast because "I slept through it". Noted meal completions 25-50%. Pt with multiple Hormel Shakes in her room- pt shares she does not like them. Discussed importance of good meal and supplement intake to promote healing.   Wt has been stable over the past month.   Per TOC notes, plan to d/c SNF once bed is available.    Medications reviewed and include megace, remeron, and thiamine.   Labs reviewed: CBGS: 79-169.   Diet Order:   Diet Order             Diet regular Room service appropriate? Yes; Fluid consistency: Thin  Diet effective now                   EDUCATION NEEDS:   Not appropriate for education at this time  Skin:  Skin Assessment: Reviewed RN Assessment  Last BM:   07/11/21  Height:   Ht Readings from Last 1 Encounters:  07/09/21 5\' 6"  (1.676 m)    Weight:   Wt Readings from Last 1 Encounters:  07/12/21 64.1 kg   BMI:  Body mass index is 22.81 kg/m.  Estimated Nutritional Needs:   Kcal:  2423-5361  Protein:  85-100 grams  Fluid:  >1.75 L    Loistine Chance, RD, LDN, [place] Registered Dietitian II Certified Diabetes Care and Education Specialist Please refer to [facility] for RD and/or RD on-call/weekend/after hours pager

## 2021-07-12 NOTE — TOC Progression Note (Signed)
Transition of Care [facility]) - Progression Note    Patient Details  Name: Amber [name] MRN: [id number] Date of Birth: 11-10-1945  Transition [facility]) CM/SW [place], RN Phone Number: 07/12/2021, 11:30 AM  Clinical Narrative:   Patient and family chose [facility] for SNF.  Message left with admissions at [facility].  Awaiting return call for transfer details.  TOC to follow    Expected Discharge Plan:  (TBD) Barriers to Discharge: Continued Medical Work up  Expected Discharge Plan and Services Expected Discharge Plan:  (TBD)   Discharge Planning Services: CM Consult Post Acute Care Choice:  (TBD) Living arrangements for the past 2 months: Single Family Home                                       Social Determinants of Health (SDOH) Interventions    Readmission Risk Interventions No flowsheet data found.

## 2021-07-13 DIAGNOSIS — A0472 Enterocolitis due to Clostridium difficile, not specified as recurrent: Secondary | ICD-10-CM

## 2021-07-13 HISTORY — DX: Enterocolitis due to Clostridium difficile, not specified as recurrent: A04.72

## 2021-07-13 LAB — GLUCOSE, CAPILLARY: Glucose-Capillary: 79 mg/dL (ref 70–99)

## 2021-07-13 MED ORDER — SODIUM BICARBONATE 650 MG PO TABS: 650.0000 mg | ORAL_TABLET | Freq: Two times a day (BID) | ORAL | 0 refills | Status: DC

## 2021-07-13 MED ORDER — ONDANSETRON HCL 4 MG PO TABS: 4.0000 mg | ORAL_TABLET | Freq: Four times a day (QID) | ORAL | 0 refills | Status: DC | PRN

## 2021-07-13 MED ORDER — CYANOCOBALAMIN 1000 MCG PO TABS: 1000.0000 ug | ORAL_TABLET | Freq: Every day | ORAL | 0 refills | Status: DC

## 2021-07-13 MED ORDER — MIRTAZAPINE 45 MG PO TABS: 45.0000 mg | ORAL_TABLET | Freq: Every day | ORAL | 0 refills | Status: DC

## 2021-07-13 MED ORDER — ARTIFICIAL TEARS OPHTHALMIC OINT: TOPICAL_OINTMENT | OPHTHALMIC | 0 refills | Status: DC | PRN

## 2021-07-13 MED ORDER — MEGESTROL ACETATE 400 MG/10ML PO SUSP: 400.0000 mg | Freq: Every day | ORAL | 0 refills | Status: DC

## 2021-07-13 MED ORDER — APIXABAN 5 MG PO TABS: 5.0000 mg | ORAL_TABLET | Freq: Two times a day (BID) | ORAL | 0 refills | Status: DC

## 2021-07-13 MED ORDER — OLANZAPINE 5 MG PO TBDP: 5.0000 mg | ORAL_TABLET | Freq: Every day | ORAL | 0 refills | Status: DC

## 2021-07-13 MED ORDER — ADULT MULTIVITAMIN W/MINERALS CH: 1.0000 | ORAL_TABLET | Freq: Every day | ORAL | 0 refills | Status: DC

## 2021-07-13 NOTE — Assessment & Plan Note (Signed)
The patient completed a 10-day course of oral vancomycin.  No further diarrhea.

## 2021-07-13 NOTE — Discharge Summary (Signed)
Physician Discharge Summary   Patient: Amber [name] MRN: [id number] DOB: 11-20-1945  Admit date:     06/19/2021  Discharge date: 07/13/21  Discharge Physician: Loletha Grayer   PCP: Lindell Spar, MD   Recommendations at discharge:   Follow-up Dr. At [place] 1 day Follow-up PCP upon discharge from rehab Will need a psychiatrist as outpatient.  Daughter to look into this. 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 difficile colitis     Hospital Course: The patient is a 76 year old Caucasian female with a past medical history significant for but not due to CKD stage IIIa, hypertension, vitamin D deficiency, chronic DVT on Eliquis, history of psychotic depression with refusal to eat who was admitted as a transfer from [facility] for ECT by Dr. Weber Cooks of psychiatry.     She was admitted to [facility] from 06/08/21-06/19/21 after presenting with failure to thrive following a several week history of complaints of difficulty swallowing with decreased oral intake and subsequent development of confusion and hallucinations.  She had outpatient work-up include brain MRI as well as MRV which was unremarkable.  On admission she is found to have an AKI and several electrolyte abnormalities.  While at [facility] she refused to eat and refused further evaluation with esophagram to evaluate her sensation of dysphagia.   She is evaluated by psychiatry who determined her to not have capacity for medical decision-making and so an NG tube was placed for force-feeding.  Palliative care consult was also done.  Due to her poor response to oral antidepressants ECG was recommended and thus arranged to transfer to [facility].  On 06/08/2020 she tested positive for COVID and she received 3 days of remdesivir.  Most of her nutritional deficiencies related to inadequate oral intake were corrected prior to transfer and on the day of discharge she developed acute urinary retention a Foley catheter was placed and it was traumatic insertion with gross development of gross hematuria.  Hemoglobin did drop to 7.0    On the afternoon of 06/21/21 she spiked a temperature of 101.9 and became tachycardic and slightly hypotensive.  She is given IV fluid bolus and she is pancultured.  Further work-up revealed that she had a right lower lobe pneumonia so she was started on IV Unasyn and completed a course.    Given that she continues to have some mild loose stools ID felt that since the patient was positive toxin producing C. difficile empirically treat with vancomycin for 10 days.  Foley catheter has been removed.  she appeared to have a positive response to ECT.  Recommend checking CBC and BMP in 1 week.  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  Continue Zyprexa and Remeron.  Medically stable for discharge.  COVID-19 virus infection 06/08/2021 Completed isolation here in the hospital.  Moderate protein-calorie malnutrition ([place])- (present on admission) Patient  on Megace and Remeron  Aspiration pneumonia of right lung due to gastric secretions [facility]) Completed treatment during the hospital course.  Stage 3a chronic kidney disease (HCC) Last creatinine 1.5 with a GFR of 37.  Patient on sodium bicarb.  Recommend checking a BMP in 1 week.  ABLA (acute blood loss anemia) Last hemoglobin  8.8.  Vitamin B12 level also low at 135.  Continue B12 supplementation.  Acute urinary retention Patient had Foley catheter during the hospital course.  This has been discontinued.  If the patient has trouble urinating can do a bladder scan.  Patient states that she is urinating well.  The Pseudomonas UTI was not treated secondary to the patient having a Foley catheter at that time.  Gross hematuria CT scan and ultrasound showed nonobstructive kidney stones.  Can consider outpatient urology.  C. difficile colitis The patient completed a 10-day course of oral vancomycin.  No further diarrhea.  Chronic deep vein thrombosis (DVT) (HCC) Patient on Eliquis.           Consultants: Infectious disease, psychiatry Procedures performed: ECT treatments Disposition: Rehabilitation facility Diet recommendation:  Regular diet.  Plus feeding supplement (boost/resource breeze) 1 container 3 times a day between meals  DISCHARGE MEDICATION: Allergies as of 07/13/2021       Reactions   Shellfish Allergy    "I pass out"        Medication List     STOP taking these medications    cyanocobalamin 1000 MCG/ML injection Commonly known as: (VITAMIN B-12) Replaced by: cyanocobalamin 1000 MCG tablet   enoxaparin 80 MG/0.8ML injection Commonly known as: LOVENOX   feeding supplement (OSMOLITE 1.2 CAL) Liqd   free water Soln   haloperidol lactate 5 MG/ML injection Commonly known as: HALDOL   multivitamin Liqd   sodium chloride 0.9 % SOLN 50 mL with thiamine 100 MG/ML SOLN 500 mg       TAKE these medications    acetaminophen 500 MG tablet Commonly known as: TYLENOL Take 500 mg by mouth every 6 (six) hours as needed for moderate pain or mild pain.   apixaban 5 MG Tabs tablet Commonly known as: ELIQUIS Take 1 tablet (5 mg total) by mouth 2 (two) times daily.   artificial tears Oint ophthalmic ointment Commonly known as: LACRILUBE Place into both eyes every 3 (three) hours as  needed for dry eyes.   cyanocobalamin 1000 MCG tablet Take 1 tablet (1,000 mcg total) by mouth daily. Replaces: cyanocobalamin 1000 MCG/ML injection   megestrol 400 MG/10ML suspension Commonly known as: MEGACE Take 10 mLs (400 mg total) by mouth daily.   mirtazapine 45 MG tablet Commonly known as: REMERON Take 1 tablet (45 mg total) by mouth at bedtime. What changed:  medication strength how much to take how to take this   multivitamin with minerals Tabs tablet Take 1 tablet by mouth daily.   OLANZapine zydis 5 MG disintegrating tablet Commonly known as: ZYPREXA Take 1 tablet (5 mg total) by mouth at bedtime.   ondansetron 4 MG tablet Commonly known as: ZOFRAN Take 1 tablet (4 mg total) by mouth every 6 (six) hours as needed for nausea.   sodium bicarbonate 650 MG tablet Take 1 tablet (650 mg total) by mouth 2 (two) times daily. What changed: how to take this   tamsulosin 0.4 MG Caps capsule Commonly known as: FLOMAX Take 1 capsule (0.4 mg total) by mouth daily after supper.   thiamine 100 MG tablet Take 1 tablet (100 mg total) by  mouth daily.   Vitamin D (Ergocalciferol) 1.25 MG (50000 UNIT) Caps capsule Commonly known as: DRISDOL Take 1 capsule (50,000 Units total) by mouth every 7 (seven) days.        Contact information for after-discharge care     Destination     [facility] .   Service: Skilled Nursing Contact information: [place] [phone]                     Discharge Exam: Filed Weights   07/09/21 1321 07/11/21 0500 07/12/21 0500  Weight: 62.9 kg 63.9 kg 64.1 kg   Physical Exam HENT:     Head: Normocephalic.     Mouth/Throat:     Pharynx: No oropharyngeal exudate.  Eyes:     General: Lids are normal.     Conjunctiva/sclera: Conjunctivae normal.  Cardiovascular:     Rate and Rhythm: Normal rate and regular rhythm.     Heart sounds: Normal heart sounds, S1 normal  and S2 normal.  Pulmonary:     Breath sounds: Normal breath sounds. No decreased breath sounds, wheezing, rhonchi or rales.  Abdominal:     Palpations: Abdomen is soft.     Tenderness: There is no abdominal tenderness.  Musculoskeletal:     Right lower leg: No swelling.     Left lower leg: No swelling.  Skin:    General: Skin is warm.     Findings: No rash.  Neurological:     Mental Status: She is alert.     Comments: Patient able to straight leg raise bilaterally.     Condition at discharge: stable  The results of significant diagnostics from this hospitalization (including imaging, microbiology, ancillary and laboratory) are listed below for reference.   Imaging Studies: CT CHEST W CONTRAST  Result Date: 06/24/2021 CLINICAL DATA:  Lung nodule, > 88mm Lung Nodule EXAM: CT CHEST WITH CONTRAST TECHNIQUE: Multidetector CT imaging of the chest was performed during intravenous contrast administration. RADIATION DOSE REDUCTION: This exam was performed according to the departmental dose-optimization program which includes automated exposure control, adjustment of the mA and/or kV according to patient size and/or use of iterative reconstruction technique. CONTRAST:  46mL OMNIPAQUE IOHEXOL 300 MG/ML  SOLN COMPARISON:  Same day radiograph. FINDINGS: Cardiovascular: There is peripherally located pulmonary embolism located in both main pulmonary arteries. This is nonocclusive. No CT evidence of RIGHT heart strain. Heart is enlarged. Three-vessel coronary artery atherosclerotic calcifications. No pericardial effusion. LEFT vertebral artery arises directly from the aorta. Mediastinum/Nodes: LEFT thyroid nodule measuring approximately 20 mm. No suspicious axillary or mediastinal adenopathy. Lungs/Pleura: No pleural effusion or pneumothorax. Nodular opacity of the superior aspect of the LEFT lower lobe demonstrates several internal lucencies and spans 15 by 10 mm (series 2, image 50). Additional scattered  ground-glass opacities throughout the LEFT lower lobe. Lingular atelectasis. Upper Abdomen: LEFT hepatic cyst. Enteric tube tip terminates in the duodenum. Diverticulosis. Musculoskeletal: Degenerative changes of the thoracic spine. IMPRESSION: 1. There is peripherally located pulmonary embolism located most centrally within the main branches of the pulmonary artery. It is nonocclusive. No CT evidence of RIGHT heart strain. This is consistent with age-indeterminate pulmonary emboli. 2. Nodular opacity seen on chest radiograph corresponds to a ground-glass opacity with internal lucency in the superior aspect of the LEFT lower lobe. Differential considerations include infection, sequela of pulmonary infarction, or neoplasm. Recommend follow-up CT in 3 months. This recommendation follows the consensus statement: Guidelines for Management of Incidental  Pulmonary Nodules Detected on CT Images: From the Fleischner Society 2017; Radiology 2017; 284:228-243. 3. There is a 2 cm LEFT thyroid nodule. Recommend nonemergent thyroid US (ref: J Am Coll Radiol. 2015 Feb;12(2): 143-50). Aortic Atherosclerosis (ICD10-I70.0). These results were called by telephone at the time of interpretation on 06/24/2021 at 6:06 pm to provider [facility] , who verbally acknowledged these results. Electronically Signed   By: Valentino Saxon M.D.   On: 06/24/2021 18:11   CT ABDOMEN PELVIS W CONTRAST  Result Date: 06/14/2021 CLINICAL DATA:  Left adnexal mass, dysphagia, malignancy suspected EXAM: CT ABDOMEN AND PELVIS WITH CONTRAST TECHNIQUE: Multidetector CT imaging of the abdomen and pelvis was performed using the standard protocol following bolus administration of intravenous contrast. RADIATION DOSE REDUCTION: This exam was performed according to the departmental dose-optimization program which includes automated exposure control, adjustment of the mA and/or kV according to patient size and/or use of iterative reconstruction technique.  CONTRAST:  191mL OMNIPAQUE IOHEXOL 300 MG/ML  SOLN COMPARISON:  CT abdomen pelvis, 03/05/2021, pelvic ultrasound, 04/11/2021 FINDINGS: Lower chest: No acute abnormality. Hepatobiliary: No solid liver abnormality is seen. Hepatic steatosis. No gallstones, gallbladder wall thickening, or biliary dilatation. Pancreas: Unremarkable. No pancreatic ductal dilatation or surrounding inflammatory changes. Spleen: Normal in size without significant abnormality. Adrenals/Urinary Tract: Adrenal glands are unremarkable. Large calculus in the right renal pelvis measuring 1.5 cm (series 4, image 32). Additional punctuate nonobstructive calculi in the inferior pole of left kidney (series 7, image 49). No hydronephrosis. Bladder is unremarkable. Stomach/Bowel: Stomach is within normal limits. Appendix appears normal. Descending and sigmoid diverticulosis. Large stool ball in the rectum measuring at least 7.3 cm, with mild circumferential rectal wall thickening and adjacent fat stranding (series 4, image 78). Vascular/Lymphatic: Aortic atherosclerosis. No enlarged abdominal or pelvic lymph nodes. Reproductive: Partially exophytic mass of the left aspect of the uterine fundus, with homogeneous enhancement and texture to the adjacent myometrium, measuring 3.9 x 2.9 cm (series 4, image 69). This is additionally clearly distinct from the normal left ovary (series 4, image 62). Other: No abdominal wall hernia or abnormality. No ascites. Musculoskeletal: No acute or significant osseous findings. IMPRESSION: 1. Partially exophytic mass of the left aspect of the uterine fundus, with homogeneous enhancement and texture to the adjacent myometrium, measuring 3.9 x 2.9 cm. This is additionally clearly distinct from the normal left ovary. Uterine fibroid is strongly favored, in keeping with findings of prior ultrasound, although this could be definitively characterized by contrast enhanced pelvic MRI on a nonemergent, outpatient basis if desired.  2. No evidence of lymphadenopathy or metastatic disease in the abdomen or pelvis. 3. Large stool ball in the rectum measuring at least 7.3 cm, with mild circumferential rectal wall thickening and adjacent fat stranding. Correlate for fecal impaction and stercoral colitis. 4. Nonobstructive bilateral nephrolithiasis. 5. Hepatic steatosis. 6. Descending and sigmoid diverticulosis without evidence of acute diverticulitis. Aortic Atherosclerosis (ICD10-I70.0). Electronically Signed   By: Delanna Ahmadi M.D.   On: 06/14/2021 08:18   DG Chest Port 1 View  Result Date: 06/27/2021 CLINICAL DATA:  Exam ordered for SOB EXAM: PORTABLE CHEST - 1 VIEW COMPARISON:  06/24/2021 FINDINGS: Some increase in patchy opacities at the left lung base. Blunting of the left lateral costophrenic angle suggesting effusion. Right lung clear. Heart size upper limits normal for technique. Interval removal of feeding tube. Visualized bones unremarkable. IMPRESSION: Possible small left effusion, with patchy opacities at the left lung base. Electronically Signed   By: Eden Emms.D.  On: 06/27/2021 08:09   DG Chest Port 1 View  Result Date: 06/24/2021 CLINICAL DATA:  Pre ECT EXAM: PORTABLE CHEST 1 VIEW COMPARISON:  June 23, 2021 FINDINGS: The cardiomediastinal silhouette is unchanged in contour.Tortuous thoracic aorta. The enteric tube courses through the chest to the abdomen beyond the field-of-view. No pleural effusion. No pneumothorax. Similar appearance of a LEFT upper lung nodular opacity. Scattered linear opacities most consistent with atelectasis. Visualized abdomen is unremarkable. IMPRESSION: 1. Stable chest radiograph. Similar appearance of a possible nodular opacity versus summation artifact of overlapping vascular shadows of the LEFT upper lung. When clinically appropriate, chest CT with contrast is recommended versus follow-up PA and lateral chest radiograph in 3-4 weeks. Electronically Signed   By: Valentino Saxon M.D.    On: 06/24/2021 17:17   DG Chest Port 1 View  Result Date: 06/23/2021 CLINICAL DATA:  Shortness of breath. EXAM: PORTABLE CHEST 1 VIEW COMPARISON:  Portable chest 06/20/2021. FINDINGS: 4:15 a.m., 06/23/2021. Cardiomegaly. The central vessels are normal in caliber. Stable mediastinum with aortic tortuosity and ectasia. The lungs are clear of focal infiltrates, with linear scarring or atelectasis left mid field again noted. There is an irregular left upper lobe opacity measuring 1.7 x 0.8 cm. In retrospect this may have been present on earlier studies. Findings concerning for pulmonary nodule. Chest CT recommended preferably with contrast. No pleural effusion is seen. Osteopenia thoracic spondylosis. Feeding tube enters the stomach. IMPRESSION: 1.7 x 0.8 cm left upper lobe opacity concerning for nodule. Chest CT recommended, preferably with contrast. No appreciable focal pneumonia. Cardiomegaly. Electronically Signed   By: Telford Nab M.D.   On: 06/23/2021 06:06   DG Chest Port 1 View  Result Date: 06/20/2021 CLINICAL DATA:  Fever EXAM: PORTABLE CHEST 1 VIEW COMPARISON:  06/11/2021 FINDINGS: Transverse diameter of heart is increased. Thoracic aorta is tortuous and ectatic. There is poor inspiration which may account for slight prominence of central pulmonary vessels. There are linear densities in the left parahilar region. New patchy infiltrates are seen in the right lower lung fields. There is no pleural effusion or pneumothorax. Enteric tube is noted traversing the thoracic esophagus. Skin fold is noted in the lateral aspect of left lower lung fields. IMPRESSION: There is new patchy infiltrate in right lower lung fields suggesting atelectasis/pneumonia. There are transverse linear densities in the left parahilar region suggesting scarring or subsegmental atelectasis or lung nodules. Electronically Signed   By: Elmer Picker M.D.   On: 06/20/2021 16:59   DG Abd Portable 1V  Result Date:  06/15/2021 CLINICAL DATA:  Feeding tube placement. EXAM: PORTABLE ABDOMEN - 1 VIEW COMPARISON:  No comparison studies available. FINDINGS: 1449 hours. The tip of the feeding tube is in the antro pyloric region of the stomach directed towards the duodenum. Visualized upper abdomen shows a nonspecific bowel gas pattern. IMPRESSION: Feeding tube tip is at the level of the pylorus, directed towards the duodenum. Electronically Signed   By: Misty Stanley M.D.   On: 06/15/2021 15:00    Microbiology: Results for orders placed or performed during the hospital encounter of 06/19/21  CULTURE, BLOOD (ROUTINE X 2) w Reflex to ID Panel     Status: None   Collection Time: 06/20/21  4:42 PM   Specimen: BLOOD RIGHT ARM  Result Value Ref Range Status   Specimen Description BLOOD RIGHT ARM  Final   Special Requests   Final    BOTTLES DRAWN AEROBIC AND ANAEROBIC Blood Culture adequate volume   Culture  Final    NO GROWTH 5 DAYS Performed at [facility], [address] [place], [place] [address]    Report Status 06/25/2021 FINAL  Final  CULTURE, BLOOD (ROUTINE X 2) w Reflex to ID Panel     Status: None   Collection Time: 06/20/21  4:53 PM   Specimen: BLOOD RIGHT HAND  Result Value Ref Range Status   Specimen Description BLOOD RIGHT HAND  Final   Special Requests   Final    BOTTLES DRAWN AEROBIC AND ANAEROBIC Blood Culture adequate volume   Culture   Final    NO GROWTH 5 DAYS Performed at [facility], [address] [place], [place] [address]    Report Status 06/25/2021 FINAL  Final  Urine Culture     Status: Abnormal   Collection Time: 06/21/21  7:30 AM   Specimen: Urine, Random  Result Value Ref Range Status   Specimen Description   Final    URINE, RANDOM Performed at [facility], [address] [place], [place] [address]    Special Requests   Final    NONE Performed at [facility], [place], [place] [address]    Culture 70,000  COLONIES/mL PSEUDOMONAS AERUGINOSA (A)  Final   Report Status 06/23/2021 FINAL  Final   Organism ID, Bacteria PSEUDOMONAS AERUGINOSA (A)  Final      Susceptibility   Pseudomonas aeruginosa - MIC*    CEFTAZIDIME 2 SENSITIVE Sensitive     CIPROFLOXACIN <=0.25 SENSITIVE Sensitive     GENTAMICIN <=1 SENSITIVE Sensitive     IMIPENEM 2 SENSITIVE Sensitive     PIP/TAZO <=4 SENSITIVE Sensitive     CEFEPIME 2 SENSITIVE Sensitive     * 70,000 COLONIES/mL PSEUDOMONAS AERUGINOSA  C Difficile Quick Screen w PCR reflex     Status: Abnormal   Collection Time: 06/21/21  1:48 PM   Specimen: STOOL  Result Value Ref Range Status   C Diff antigen POSITIVE (A) NEGATIVE Final   C Diff toxin POSITIVE (A) NEGATIVE Final   C Diff interpretation Toxin producing C. difficile detected.  Final    Comment: CRITICAL RESULT CALLED TO, READ BACK BY AND VERIFIED WITH: [name] HUSKNIS 06/21/21 1455 Performed at [facility], [address] [place], [place] [address]     Labs: CBC: Recent Labs  Lab 07/09/21 0903  WBC 9.3  HGB 8.8*  HCT 27.5*  MCV 93.5  PLT 297   Basic Metabolic Panel: Recent Labs  Lab 07/09/21 0903 07/10/21 0446 07/11/21 0455  NA 139 142 141  K 3.9 3.8 3.5  CL 115* 118* 116*  CO2 21* 21* 22  GLUCOSE 86 85 85  BUN 28* 29* 30*  CREATININE 1.50* 1.41* 1.50*  CALCIUM 9.4 9.6 9.6  MG 1.9  --   --      Discharge time spent: greater than 30 minutes.  Signed: Loletha Grayer, MD [facility] 07/13/2021

## 2021-07-13 NOTE — Progress Notes (Signed)
°  Discharge canceled secondary to not having pasrr number back.

## 2021-07-13 NOTE — TOC Progression Note (Addendum)
Transition of Care [facility]) - Progression Note    Patient Details  Name: FINN ALTEMOSE MRN: [id number] Date of Birth: [birth year]  Transition of Care [facility]) CM/SW [place], LCSW Phone Number: 07/13/2021, 9:05 AM  Clinical Narrative:   Left VM for Star at [facility] requesting a return call. Per notes, plan for [facility] today.  10:10- Attempted to reach out to Star again. No response.  Called [facility] front desk and spoke with Jan, she is trying to get in touch with Star to call CSW back.  10:20- Call from Jan at [facility] who stated patient will go to Room 908. Notified RN of # to call for report.  Expected Discharge Plan:  (TBD) Barriers to Discharge: Continued Medical Work up  Expected Discharge Plan and Services Expected Discharge Plan:  (TBD)   Discharge Planning Services: CM Consult Post Acute Care Choice:  (TBD) Living arrangements for the past 2 months: Single Family Home Expected Discharge Date: 07/13/21                                     Social Determinants of Health (SDOH) Interventions    Readmission Risk Interventions No flowsheet data found.

## 2021-07-13 NOTE — TOC Transition Note (Addendum)
Transition of Care [facility]) - CM/SW Discharge Note   Patient Details  Name: KUSHI KUN MRN: [id number] Date of Birth: [birth year]  Transition of Care [facility]) CM/SW Contact:  Magnus Ivan, LCSW Phone Number: 07/13/2021, 10:28 AM   Clinical Narrative:   Discharge to [facility] today. Room 908 on [facility]. Confirmed with Receptionist Jan. (Left 2 VM for Star in Admissions). Updated MD, RN, and both of patient's daughters. Asked RN to call report. EMS paperwork completed. Called [facility], confirmed they still have patient on the list for 1pm pick up today.  10:35- Return call from Star in admissions, confirmed the plan above.  10:50- Call from Star that [place] was not uploaded. They cannot take patient until PASRR is complete. TOC uploading info now. Notified TOC Supervisor of possible delay.   11:25- Called [facility] to push back pick up for 3 pm to give time for PASRR. Updated daughter. Will continue to check PASRR.    1:47- PASRR still pending. Called [facility] who said they have 18 calls in front of patient's so it will be later than 3 pm anyways, to just let them know if it needs to be cancelled.   2:15- Call from Star, if PASRR is not back by 4 pm they won't be able to take patient today. Can take patient over weekend if PASRR comes back, if not can take Monday pending PASRR.  3:00- Call from daughter, provided update. PASRR still pending. She verbalized frustration with DC possibly being delayed due to information not being uploaded earlier in the week. TOC Supervisor Manuela Schwartz has been made aware of situation. Offered Office of Patient Experience, she declined. Confirmed with Supervisor NCMust does not run on the weekends so it would be Monday if we do not get it back today.  3:20- Called [place] [facility] and spoke to [place]. He stated there is no update or way to tell if this will be done today. He said they are not closed on Monday for Presidents Day. He confirmed they work Mon-Fri so it  most likely will not come back this weekend.  3:50- Spoke to Star at [place]. PASRR still pending. Per Star, we will plan for Monday unless PASRR does happen to come back this weekend. TOC will monitor this weekend. Called and cancelled [facility] for today. Called and updated daughter [name]. Updated RN, MD, and [facility] Supervisor.   Final next level of care: Skilled Nursing Facility Barriers to Discharge: Barriers Resolved   Patient Goals and CMS Choice Patient states their goals for this hospitalization and ongoing recovery are:: SNF CMS Medicare.gov Compare Post Acute Care list provided to:: Patient Represenative (must comment) Choice offered to / list presented to : Adult Children  Discharge Placement              Patient chooses bed at: [facility] Patient to be transferred to facility by: [facility] at 1pm - confirmed Name of family member notified: [name] and Almyra Free - daughters Patient and family notified of of transfer: 07/13/21  Discharge Plan and Services   Discharge Planning Services: CM Consult Post Acute Care Choice:  (TBD)                               Social Determinants of Health (SDOH) Interventions     Readmission Risk Interventions No flowsheet data found.

## 2021-07-13 NOTE — Care Management Important Message (Signed)
Important Message  Patient Details  Name: Amber [name] MRN: [id number] Date of Birth: January 19, 1946   Medicare Important Message Given:  Yes  Reviewed Medicare IM with [name], daughter, at [phone].  Confirmed she received copy of Medicare IM previously delivered to patient's room.   Dannette Barbara 07/13/2021, 2:21 PM

## 2021-07-13 NOTE — Progress Notes (Signed)
RE: Amber [name] Date of Birth: 06/20/2045 Date: 07/13/21  To Whom It May Concern:  Please be advised that the above-named patient will require a short-term nursing home stay - anticipated 30 days or less for rehabilitation and strengthening.  The plan is for return home.

## 2021-07-14 DIAGNOSIS — R000 Tachycardia, unspecified: Secondary | ICD-10-CM

## 2021-07-14 LAB — BASIC METABOLIC PANEL
Anion gap: 5 (ref 5–15)
BUN: 37 mg/dL — ABNORMAL HIGH (ref 8–23)
CO2: 24 mmol/L (ref 22–32)
Calcium: 9.7 mg/dL (ref 8.9–10.3)
Chloride: 112 mmol/L — ABNORMAL HIGH (ref 98–111)
Creatinine, Ser: 1.59 mg/dL — ABNORMAL HIGH (ref 0.44–1.00)
GFR, Estimated: 34 mL/min — ABNORMAL LOW (ref >60)
Glucose, Bld: 98 mg/dL (ref 70–99)
Potassium: 3.6 mmol/L (ref 3.5–5.1)
Sodium: 141 mmol/L (ref 135–145)

## 2021-07-14 LAB — TROPONIN I (HIGH SENSITIVITY)
Troponin I (High Sensitivity): 13 ng/L (ref <18)
Troponin I (High Sensitivity): 12 ng/L (ref <18)

## 2021-07-14 MED ORDER — METOPROLOL SUCCINATE ER 25 MG PO TB24
12.5000 mg | ORAL_TABLET | Freq: Every day | ORAL | Status: DC
  Administered 2021-07-14 – 2021-07-17 (×4): 12.5 mg via ORAL
  Filled 2021-07-14 (×4): qty 1

## 2021-07-14 MED ORDER — BUSPIRONE HCL 10 MG PO TABS
5.0000 mg | ORAL_TABLET | Freq: Two times a day (BID) | ORAL | Status: DC | PRN
  Administered 2021-07-14: 13:00:00 5 mg via ORAL
  Filled 2021-07-14: qty 1

## 2021-07-14 MED ORDER — BUSPIRONE HCL 10 MG PO TABS: 5.0000 mg | ORAL_TABLET | Freq: Three times a day (TID) | ORAL | Status: DC | PRN

## 2021-07-14 MED ORDER — ALPRAZOLAM 0.25 MG PO TABS
0.2500 mg | ORAL_TABLET | Freq: Once | ORAL | Status: DC
  Filled 2021-07-14: qty 1

## 2021-07-14 MED ORDER — MIRTAZAPINE 15 MG PO TABS
30.0000 mg | ORAL_TABLET | Freq: Every day | ORAL | Status: DC
  Administered 2021-07-14 – 2021-07-16 (×3): 30 mg via ORAL
  Filled 2021-07-14 (×3): qty 2

## 2021-07-14 MED ORDER — ALPRAZOLAM 0.25 MG PO TABS
0.2500 mg | ORAL_TABLET | Freq: Two times a day (BID) | ORAL | Status: DC | PRN
  Administered 2021-07-14 – 2021-07-16 (×4): 0.25 mg via ORAL
  Filled 2021-07-14 (×4): qty 1

## 2021-07-14 NOTE — Assessment & Plan Note (Signed)
Last calcium in the normal range.  We will recheck labs on 07/15/2020.

## 2021-07-14 NOTE — TOC Progression Note (Signed)
Transition of Care [facility]) - Progression Note    Patient Details  Name: Amber [name] MRN: [id number] Date of Birth: 11/25/1945  Transition of Care [facility]) CM/SW [place], LCSW Phone Number: 07/14/2021, 10:05 AM  Clinical Narrative:   Checked [place] [facility]. PASRR still pending.     Expected Discharge Plan:  (TBD) Barriers to Discharge: Barriers Resolved  Expected Discharge Plan and Services Expected Discharge Plan:  (TBD)   Discharge Planning Services: CM Consult Post Acute Care Choice:  (TBD) Living arrangements for the past 2 months: Single Family Home Expected Discharge Date: 07/13/21                                     Social Determinants of Health (SDOH) Interventions    Readmission Risk Interventions No flowsheet data found.

## 2021-07-14 NOTE — Progress Notes (Signed)
°   07/14/21 1236  Assess: MEWS Score  Temp 97.6 F (36.4 C)  BP 124/88  Pulse Rate (!) 120  Resp 18  SpO2 100 %  O2 Device Room Air  Assess: MEWS Score  MEWS Temp 0  MEWS Systolic 0  MEWS Pulse 2  MEWS RR 0  MEWS LOC 0  MEWS Score 2  MEWS Score Color Yellow  Assess: if the MEWS score is Yellow or Red  Were vital signs taken at a resting state? Yes  Focused Assessment No change from prior assessment  Does the patient meet 2 or more of the SIRS criteria? No  Does the patient have a confirmed or suspected source of infection? No  Provider and Rapid Response Notified? No  Early Detection of Sepsis Score *See Row Information* Low  MEWS guidelines implemented *See Row Information* Yes  Treat  MEWS Interventions Other (Comment) (Obtain EKG)  Take Vital Signs  Increase Vital Sign Frequency  Yellow: Q 2hr X 2 then Q 4hr X 2, if remains yellow, continue Q 4hrs  Escalate  MEWS: Escalate Yellow: discuss with charge nurse/RN and consider discussing with provider and RRT  Notify: Charge Nurse/RN  Name of Charge Nurse/RN Notified Gerald Stabs  Date Charge Nurse/RN Notified 07/14/21  Time Charge Nurse/RN Notified 1316  Notify: Provider  Provider Name/Title Dr Leslye Peer  Date Provider Notified 07/14/21  Time Provider Notified 1308  Notification Type  (secure chat)  Notification Reason Other (Comment) (Increased pulse rate)  Provider response See new orders  Date of Provider Response 07/14/21  Time of Provider Response 1310  Document  Patient Outcome Other (Comment) (Continue to Monitor)  Progress note created (see row info) Yes  Assess: SIRS CRITERIA  SIRS Temperature  0  SIRS Pulse 1  SIRS Respirations  0  SIRS WBC 0  SIRS Score Sum  1

## 2021-07-14 NOTE — Progress Notes (Signed)
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  Continue Zyprexa and Remeron.  Medically stable for discharge. Pasrr paperwork was unable to be obtained on Friday and likely will be Monday by the time we get this.  Tachycardia Heart rate up to 120.  We will get an EKG and assess.  The last time I started medications for fast heart rate her blood pressure went low.  Could be anxiety.  Can try and as needed BuSpar.  COVID-19 virus infection 06/08/2021 Completed isolation here in the hospital.  Moderate protein-calorie malnutrition ([place])- (present on admission) Patient on Remeron.  Patient did not want the Megace because she does not like the taste.  I will discontinue this medication.  Aspiration pneumonia of right lung due to gastric secretions [facility]) Completed treatment during the hospital course.  Stage 3a chronic kidney disease (HCC) Last creatinine 1.5 with a GFR of 37.  Patient on sodium bicarb.  Recommend checking a BMP in 1 week.  ABLA (acute blood loss anemia) Last hemoglobin 8.8.  Vitamin B12 level also low at 135.  Continue B12 supplementation.  Acute urinary retention Patient had Foley catheter during the hospital course.  This has been discontinued.  If the patient has trouble urinating can do a bladder scan.  Patient states that she is urinating well.  The Pseudomonas UTI was not treated secondary to the patient having a Foley catheter at that time.  Gross hematuria CT scan and ultrasound showed nonobstructive kidney stones.  Can consider outpatient urology.  Primary hyperparathyroidism ([place])- (present on admission) Last calcium in the normal range.  We will recheck labs on 07/15/2020.  C. difficile colitis The patient completed a 10-day course of oral vancomycin.  No  further diarrhea.  Chronic deep vein thrombosis (DVT) (HCC) Patient on Eliquis.        Subjective: Patient feels her insides are jittery.  This afternoon Called with a fast heart rate of 120.  Patient states that she is eating.  Cannot describe how well she feels.  Physical Exam: Vitals:   07/13/21 1955 07/14/21 0554 07/14/21 0824 07/14/21 1236  BP: 136/85 139/83 124/62 124/88  Pulse: (!) 104 92 (!) 102 (!) 120  Resp: 18 17 18 18   Temp: 99.4 F (37.4 C) 97.7 F (36.5 C) 97.9 F (36.6 C) 97.6 F (36.4 C)  TempSrc: Oral Oral Oral Oral  SpO2: 100% 100% 100% 100%  Weight:      Height:       Physical Exam HENT:     Head: Normocephalic.     Mouth/Throat:     Pharynx: No oropharyngeal exudate.  Eyes:     General: Lids are normal.     Conjunctiva/sclera: Conjunctivae normal.  Cardiovascular:     Rate and Rhythm: Regular rhythm. Tachycardia present.     Heart sounds: Normal heart sounds, S1 normal and S2 normal.  Pulmonary:     Breath sounds: Normal breath sounds. No decreased breath sounds, wheezing, rhonchi or rales.  Abdominal:     Palpations: Abdomen is soft.     Tenderness: There is no abdominal tenderness.  Musculoskeletal:     Right lower leg: No swelling.     Left lower leg: No swelling.  Skin:    General: Skin is warm.     Findings:  No rash.  Neurological:     Mental Status: She is alert.     Comments: Patient able to straight leg raise bilaterally.    Data Reviewed: We will recheck labs tomorrow  Family Communication: Spoke with patient's daughter on the phone  Disposition: Status is: Inpatient Remains inpatient appropriate because: We are waiting for Pasrr number to come back prior to going out to rehab  Planned Discharge Destination: Rehab  Author: Loletha Grayer, MD 07/14/2021 1:28 PM  For on call review [email or web address].

## 2021-07-14 NOTE — Assessment & Plan Note (Addendum)
Continue low-dose Toprol-XL.  Cardiac enzymes negative.  Jitteriness secondary to anxiety.

## 2021-07-15 LAB — CBC
HCT: 25.2 % — ABNORMAL LOW (ref 36.0–46.0)
Hemoglobin: 7.9 g/dL — ABNORMAL LOW (ref 12.0–15.0)
MCH: 29.2 pg (ref 26.0–34.0)
MCHC: 31.3 g/dL (ref 30.0–36.0)
MCV: 93 fL (ref 80.0–100.0)
Platelets: 217 10*3/uL (ref 150–400)
RBC: 2.71 MIL/uL — ABNORMAL LOW (ref 3.87–5.11)
RDW: 16 % — ABNORMAL HIGH (ref 11.5–15.5)
WBC: 5.9 10*3/uL (ref 4.0–10.5)
nRBC: 0 % (ref 0.0–0.2)

## 2021-07-15 LAB — BASIC METABOLIC PANEL
Anion gap: 5 (ref 5–15)
BUN: 35 mg/dL — ABNORMAL HIGH (ref 8–23)
CO2: 23 mmol/L (ref 22–32)
Calcium: 9.5 mg/dL (ref 8.9–10.3)
Chloride: 114 mmol/L — ABNORMAL HIGH (ref 98–111)
Creatinine, Ser: 1.52 mg/dL — ABNORMAL HIGH (ref 0.44–1.00)
GFR, Estimated: 36 mL/min — ABNORMAL LOW (ref >60)
Glucose, Bld: 92 mg/dL (ref 70–99)
Potassium: 3.5 mmol/L (ref 3.5–5.1)
Sodium: 142 mmol/L (ref 135–145)

## 2021-07-15 MED ORDER — SODIUM CHLORIDE 0.9 % IV SOLN
300.0000 mg | Freq: Once | INTRAVENOUS | Status: AC
  Administered 2021-07-15: 16:00:00 300 mg via INTRAVENOUS
  Filled 2021-07-15: qty 300

## 2021-07-15 MED ORDER — FERROUS SULFATE 325 (65 FE) MG PO TABS
325.0000 mg | ORAL_TABLET | Freq: Every day | ORAL | Status: DC
  Administered 2021-07-16 – 2021-07-17 (×2): 325 mg via ORAL
  Filled 2021-07-15 (×2): qty 1

## 2021-07-15 MED ORDER — MENTHOL 3 MG MT LOZG
1.0000 | LOZENGE | OROMUCOSAL | Status: DC | PRN
  Filled 2021-07-15: qty 9

## 2021-07-15 MED ORDER — FERROUS SULFATE 325 (65 FE) MG PO TABS: 325.0000 mg | ORAL_TABLET | Freq: Every day | ORAL | Status: DC

## 2021-07-15 NOTE — Progress Notes (Signed)
°   07/10/21 1134  Assess: MEWS Score  Temp 98 F (36.7 C)  BP 132/88  Pulse Rate (!) 116  Resp 18  SpO2 100 %  O2 Device Room Air  Assess: MEWS Score  MEWS Temp 0  MEWS Systolic 0  MEWS Pulse 2  MEWS RR 0  MEWS LOC 0  MEWS Score 2  MEWS Score Color Yellow  Assess: if the MEWS score is Yellow or Red  Were vital signs taken at a resting state? Yes  Focused Assessment No change from prior assessment  Does the patient meet 2 or more of the SIRS criteria? No  Does the patient have a confirmed or suspected source of infection? No  Provider and Rapid Response Notified? No  Early Detection of Sepsis Score *See Row Information* Low  MEWS guidelines implemented *See Row Information* Yes  Treat  MEWS Interventions Other (Comment) (continue to monitor)  Take Vital Signs  Increase Vital Sign Frequency  Yellow: Q 2hr X 2 then Q 4hr X 2, if remains yellow, continue Q 4hrs  Escalate  MEWS: Escalate Yellow: discuss with charge nurse/RN and consider discussing with provider and RRT  Notify: Charge Nurse/RN  Name of Charge Nurse/RN Notified Denice Paradise  Date Charge Nurse/RN Notified 07/10/21  Time Charge Nurse/RN Notified 1134  Assess: SIRS CRITERIA  SIRS Temperature  0  SIRS Pulse 1  SIRS Respirations  0  SIRS WBC 0  SIRS Score Sum  1

## 2021-07-15 NOTE — Progress Notes (Signed)
Physical Therapy Treatment Patient Details Name: Amber [name] MRN: [id number] DOB: 1945/11/16 Today's Date: 07/15/2021   History of Present Illness Patient is a  76 year old Caucasian female with a past medical history significant for CKD stage IIIa, hypertension, vitamin D deficiency, chronic DVT on Eliquis, history of psychotic depression with refusal to eat who was admitted as a transfer from [facility] for ECT by Dr. Weber Cooks of psychiatry.  Admitted at [facility] from 06/08/21-06/19/21 after presenting with failure to thrive following a several week history of complaints of difficulty swallowing with decreased oral intake and subsequent development of confusion and hallucinations.    PT Comments    Pt continues to be fearful of standing but with encouragement is able to lateral scoot to drop arm recliner at bedside.  No assist for bed mobility and good understanding of technique to scoot to recliner.  She continuously states while moving she can't do it and needs help, light min assist is given more for encouragement than physical assist.  She remains in recliner with needs met.  Discussed transfer with tech.  Fear and anxiety remain primary barrier for mobility.  Overall seems to have good strength for activity.   Recommendations for follow up therapy are one component of a multi-disciplinary discharge planning process, led by the attending physician.  Recommendations may be updated based on patient status, additional functional criteria and insurance authorization.  Follow Up Recommendations  Skilled nursing-short term rehab (<3 hours/day)     Assistance Recommended at Discharge    Patient can return home with the following A lot of help with bathing/dressing/bathroom;Assist for transportation;Help with stairs or ramp for entrance;A little help with walking and/or transfers   Equipment Recommendations  Rolling walker (2 wheels);Hospital bed;Wheelchair cushion (measurements  PT);BSC/3in1    Recommendations for Other Services       Precautions / Restrictions Precautions Precautions: Fall Restrictions Weight Bearing Restrictions: No     Mobility  Bed Mobility Overal bed mobility: Modified Independent Bed Mobility: Supine to Sit, Sit to Supine     Supine to sit: Modified independent (Device/Increase time)          Transfers Overall transfer level: Needs assistance Equipment used: None Transfers: Bed to chair/wheelchair/BSC            Lateral/Scoot Transfers: Min guard, Min assist General transfer comment: does very well but needs hands on assist for encouragement rather than actual physical assist    Ambulation/Gait               General Gait Details: refuses to stand   Stairs             Wheelchair Mobility    Modified Rankin (Stroke Patients Only)       Balance Overall balance assessment: Needs assistance Sitting-balance support: Feet unsupported, No upper extremity supported Sitting balance-Leahy Scale: Good         Standing balance comment: refuses to try                            Cognition Arousal/Alertness: Awake/alert Behavior During Therapy: Anxious Overall Cognitive Status: Within Functional Limits for tasks assessed                                 General Comments: anxiety and fear        Exercises Other Exercises Other Exercises: seated AROM x 10  General Comments        Pertinent Vitals/Pain Pain Assessment Pain Assessment: No/denies pain Pain Location: does report B LE numbness that makes her fearful of standing    Home Living                          Prior Function            PT Goals (current goals can now be found in the care plan section) Progress towards PT goals: Progressing toward goals    Frequency    Min 2X/week      PT Plan Current plan remains appropriate    Co-evaluation              AM-PAC PT "6 Clicks"  Mobility   Outcome Measure  Help needed turning from your back to your side while in a flat bed without using bedrails?: None Help needed moving from lying on your back to sitting on the side of a flat bed without using bedrails?: None Help needed moving to and from a bed to a chair (including a wheelchair)?: A Little Help needed standing up from a chair using your arms (e.g., wheelchair or bedside chair)?: A Little Help needed to walk in hospital room?: A Lot Help needed climbing 3-5 steps with a railing? : Total 6 Click Score: 17    End of Session Equipment Utilized During Treatment: Gait belt Activity Tolerance: Patient tolerated treatment well Patient left: in chair;with call bell/phone within reach;with chair alarm set;with nursing/sitter in room Nurse Communication: Mobility status PT Visit Diagnosis: Other abnormalities of gait and mobility (R26.89);Muscle weakness (generalized) (M62.81)     Time: 3406-8403 PT Time Calculation (min) (ACUTE ONLY): 13 min  Charges:  $Therapeutic Activity: 8-22 mins                    Chesley Noon, PTA 07/15/21, 9:29 AM

## 2021-07-15 NOTE — TOC Progression Note (Signed)
Transition of Care [facility]) - Progression Note    Patient Details  Name: Amber [name] MRN: [id number] Date of Birth: 1946/05/21  Transition of Care [facility]) CM/SW [place], LCSW Phone Number: 07/15/2021, 8:57 AM  Clinical Narrative:   Checked [place] [facility]. PASRR still pending.     Expected Discharge Plan:  (TBD) Barriers to Discharge: Barriers Resolved  Expected Discharge Plan and Services Expected Discharge Plan:  (TBD)   Discharge Planning Services: CM Consult Post Acute Care Choice:  (TBD) Living arrangements for the past 2 months: Single Family Home Expected Discharge Date: 07/13/21                                     Social Determinants of Health (SDOH) Interventions    Readmission Risk Interventions No flowsheet data found.

## 2021-07-15 NOTE — Progress Notes (Signed)
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  Continue Zyprexa and Remeron.  With the jitteriness yesterday I did decrease the Remeron dose down to 30 mg.  Medically stable for discharge. Pasrr paperwork was unable to be obtained on Friday and likely will be Monday by the time we get this.  Tachycardia I started low-dose Toprol-XL yesterday.  Cardiac enzymes negative.  Jitteriness likely secondary to anxiety.  COVID-19 virus infection 06/08/2021 Completed isolation here in the hospital.  Moderate protein-calorie malnutrition ([place])- (present on admission) Patient on Remeron.  Megace discontinued because she did not like the taste of this medication.  Aspiration pneumonia of right lung due to gastric secretions [facility]) Completed treatment during the hospital course.  Stage 3a chronic kidney disease (HCC) Last creatinine 1.52.  Continue sodium bicarb.  ABLA (acute blood loss anemia) Last hemoglobin 7.9.  Vitamin B12 level also low at 135.  Continue B12 supplementation.  We will give IV iron today.  Acute urinary retention Patient had Foley catheter during the hospital course.  This has been discontinued. The Pseudomonas UTI was not treated secondary to the patient having a Foley catheter at that time.  Gross hematuria CT scan and ultrasound showed nonobstructive kidney stones.  Can consider outpatient urology.  Primary hyperparathyroidism ([place])- (present on admission) Last calcium in the normal range.  We will recheck labs on 07/15/2020.  C. difficile colitis The patient completed a 10-day course of oral vancomycin.  No further diarrhea.  Chronic deep vein thrombosis (DVT) (HCC) CT scan on 06/24/2021 did show a peripherally located pulmonary embolism nonocclusive (age-indeterminate  pulmonary emboli) Patient on Eliquis.        Subjective: Patient complains of a little sore throat and little runny nose but did not want anything for the runny nose.  A numbness that she had in her arms yesterday afternoon have went away.  The jittery feeling in her chest also went away.  Physical Exam: Vitals:   07/14/21 1629 07/14/21 2048 07/15/21 0549 07/15/21 0842  BP: 129/79 126/83 113/61 110/76  Pulse: (!) 105 (!) 102 78 (!) 102  Resp: 19 18 18 18   Temp: 98.7 F (37.1 C) 97.8 F (36.6 C) 98.4 F (36.9 C) (!) 97.4 F (36.3 C)  TempSrc: Oral  Oral Oral  SpO2: 100% 100% 100% 100%  Weight:      Height:       Physical Exam HENT:     Head: Normocephalic.     Mouth/Throat:     Pharynx: No oropharyngeal exudate.  Eyes:     General: Lids are normal.     Conjunctiva/sclera: Conjunctivae normal.  Cardiovascular:     Rate and Rhythm: Normal rate and regular rhythm.     Heart sounds: Normal heart sounds, S1 normal and S2 normal.  Pulmonary:     Breath sounds: Normal breath sounds. No decreased breath sounds, wheezing, rhonchi or rales.  Abdominal:     Palpations: Abdomen is soft.     Tenderness: There is no abdominal tenderness.  Musculoskeletal:     Right lower leg: No swelling.     Left lower leg: No swelling.  Skin:    General: Skin is warm.     Findings: No rash.  Neurological:     Mental Status: She is alert.  Comments: Patient able to straight leg raise bilaterally.     Data Reviewed: Last hemoglobin 7.9, last creatinine 1.52  Family Communication: Updated patient's daughter on the phone  Disposition: Status is: Inpatient Remains inpatient appropriate because: Will need rehab and pasrr number prior to disposition.  We will give IV iron today.  Planned Discharge Destination: Rehab   Author: Loletha Grayer, MD 07/15/2021 10:52 AM  For on call review [email or web address].

## 2021-07-16 LAB — TYPE AND SCREEN
ABO/RH(D): O POS
Antibody Screen: NEGATIVE

## 2021-07-16 LAB — HEMOGLOBIN: Hemoglobin: 8.3 g/dL — ABNORMAL LOW (ref 12.0–15.0)

## 2021-07-16 MED ORDER — MENTHOL 3 MG MT LOZG: 1.0000 | LOZENGE | OROMUCOSAL | 0 refills | Status: DC | PRN

## 2021-07-16 MED ORDER — FERROUS SULFATE 325 (65 FE) MG PO TABS: 325.0000 mg | ORAL_TABLET | Freq: Every day | ORAL | 0 refills | Status: DC

## 2021-07-16 MED ORDER — ALPRAZOLAM 0.25 MG PO TABS: 0.2500 mg | ORAL_TABLET | Freq: Two times a day (BID) | ORAL | 0 refills | Status: DC | PRN

## 2021-07-16 MED ORDER — MIRTAZAPINE 30 MG PO TABS: 30.0000 mg | ORAL_TABLET | Freq: Every day | ORAL | 0 refills | Status: DC

## 2021-07-16 MED ORDER — METOPROLOL SUCCINATE ER 25 MG PO TB24: 12.5000 mg | ORAL_TABLET | Freq: Every day | ORAL | 0 refills | Status: DC

## 2021-07-16 MED ORDER — ONDANSETRON HCL 4 MG/2ML IJ SOLN
4.0000 mg | Freq: Once | INTRAMUSCULAR | Status: AC
  Administered 2021-07-16: 11:00:00 4 mg via INTRAVENOUS
  Filled 2021-07-16: qty 2

## 2021-07-16 NOTE — TOC Progression Note (Signed)
Transition of Care [facility]) - Progression Note    Patient Details  Name: Amber [name] MRN: [id number] Date of Birth: [birth year]  Transition of Care [facility]) CM/SW [place], RN Phone Number: 07/16/2021, 4:33 PM  Clinical Narrative:   [name] received [phone] B, message left with Star at [facility] to verify [place] waiting return call.    Expected Discharge Plan:  (TBD) Barriers to Discharge: Barriers Resolved  Expected Discharge Plan and Services Expected Discharge Plan:  (TBD)   Discharge Planning Services: CM Consult Post Acute Care Choice:  (TBD) Living arrangements for the past 2 months: Single Family Home Expected Discharge Date: 07/13/21                                     Social Determinants of Health (SDOH) Interventions    Readmission Risk Interventions No flowsheet data found.

## 2021-07-16 NOTE — Progress Notes (Signed)
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  Continue Zyprexa and Remeron.  With the jitteriness yesterday I did decrease the Remeron dose down to 30 mg.  Medically stable for discharge. Pasrr paperwork still pending at this point.  Patient very anxious at this point with a lot of somatic complaints including nausea and jitteriness not feeling well and trouble swallowing.  Tachycardia Continue low-dose Toprol-XL.  Cardiac enzymes negative.  Jitteriness secondary to anxiety.  COVID-19 virus infection 06/08/2021 Completed isolation here in the hospital.  Moderate protein-calorie malnutrition ([place])- (present on admission) Patient on Remeron.  Megace discontinued because she did not like the taste of this medication.  Aspiration pneumonia of right lung due to gastric secretions [facility]) Completed treatment during the hospital course.  Stage 3a chronic kidney disease (HCC) Last creatinine 1.52.  Continue sodium bicarb.  ABLA (acute blood loss anemia) Last hemoglobin 8.3.  Vitamin B12 level also low at 135.  Continue B12 supplementation.  I gave IV iron yesterday.  Acute urinary retention Patient had Foley catheter during the hospital course.  This has been discontinued. The Pseudomonas UTI was not treated secondary to the patient having a Foley catheter at that time.  Gross hematuria CT scan and ultrasound showed nonobstructive kidney stones.  Outpatient urology follow-up.  Primary hyperparathyroidism ([place])- (present on admission) Last calcium in the normal range.  We will recheck labs on 07/15/2020.  C. difficile colitis The patient completed a 10-day course of oral vancomycin.  No further diarrhea.  Chronic deep vein thrombosis (DVT) (HCC) Patient on  Eliquis.        Subjective: Patient with a lot of somatic complaints including some nausea, jitteriness, difficulty swallowing, not feeling well.  Patient is nervous about the next facility.  Initially sent over here for ECT treatment.  Physical Exam: Vitals:   07/15/21 1614 07/15/21 2018 07/16/21 0617 07/16/21 0729  BP: 128/86 113/76 110/72 117/89  Pulse: 99 (!) 102 93 95  Resp: 16 16  16   Temp: 98.5 F (36.9 C) 97.8 F (36.6 C) 98.3 F (36.8 C) 97.8 F (36.6 C)  TempSrc: Oral Oral Oral Oral  SpO2: 100% 100% 99% 99%  Weight:      Height:       Physical Exam HENT:     Head: Normocephalic.     Mouth/Throat:     Pharynx: No oropharyngeal exudate.  Eyes:     General: Lids are normal.     Conjunctiva/sclera: Conjunctivae normal.  Cardiovascular:     Rate and Rhythm: Normal rate and regular rhythm.     Heart sounds: Normal heart sounds, S1 normal and S2 normal.  Pulmonary:     Breath sounds: Normal breath sounds. No decreased breath sounds, wheezing, rhonchi or rales.  Abdominal:     Palpations: Abdomen is soft.     Tenderness: There is no abdominal tenderness.  Musculoskeletal:     Right lower leg: No swelling.     Left lower leg: No swelling.  Skin:    General: Skin is warm.     Findings: No rash.  Neurological:     Mental Status: She is alert.     Comments: Patient able to straight leg raise bilaterally.     Data Reviewed: Today's hemoglobin 8.3  Family Communication: Updated daughter on the  phone  Disposition: Status is: Inpatient Remains inpatient appropriate because: Awaiting pasrr number prior to going out to rehab.  Planned Discharge Destination: Rehab  Author: Loletha Grayer, MD 07/16/2021 3:43 PM  For on call review [email or web address].

## 2021-07-16 NOTE — Progress Notes (Signed)
Per MD ok for patient to have no IV access.

## 2021-07-16 NOTE — TOC Progression Note (Signed)
Transition of Care [facility]) - Progression Note    Patient Details  Name: Amber [name] MRN: [id number] Date of Birth: 1946/02/27  Transition of Care [facility]) CM/SW [place], RN Phone Number: 07/16/2021, 1:34 PM  Clinical Narrative:   RNCM contacted PASRR this AM, stated that the PASRR number would be in by noon.  As of this moment, no PASSR number is in the system.  RNCM called and left a message, awaiting call back.    Expected Discharge Plan:  (TBD) Barriers to Discharge: Barriers Resolved  Expected Discharge Plan and Services Expected Discharge Plan:  (TBD)   Discharge Planning Services: CM Consult Post Acute Care Choice:  (TBD) Living arrangements for the past 2 months: Single Family Home Expected Discharge Date: 07/13/21                                     Social Determinants of Health (SDOH) Interventions    Readmission Risk Interventions No flowsheet data found.

## 2021-07-16 NOTE — Care Management Important Message (Signed)
Important Message  Patient Details  Name: Amber [name] MRN: [id number] Date of Birth: 1945-11-09   Medicare Important Message Given:  Yes     [name] 07/16/2021, 10:59 AM

## 2021-07-16 NOTE — Progress Notes (Signed)
Physical Therapy Treatment Patient Details Name: Amber [name] MRN: [id number] DOB: Oct 03, 1945 Today's Date: 07/16/2021   History of Present Illness Patient is a  76 year old Caucasian female with a past medical history significant for CKD stage IIIa, hypertension, vitamin D deficiency, chronic DVT on Eliquis, history of psychotic depression with refusal to eat who was admitted as a transfer from [facility] for ECT by Dr. Weber Cooks of psychiatry.  Admitted at [facility] from 06/08/21-06/19/21 after presenting with failure to thrive following a several week history of complaints of difficulty swallowing with decreased oral intake and subsequent development of confusion and hallucinations.    PT Comments    Pt in bed, resistant to get up but agrees with encouragement.  To EOB with supervision.  Anxious but able to lateral scoot to drop arm recliner at bedside with supervision.  Once in chair she needs to have BM.  Asks to get back to bed for bedpan but BSC was brought.  She is able to stand pivot to [facility] with stand pivot with min/mod a x 1 for encouragement.  After large soft BM she is able to stand from care with +1 for standing and +1 for care for pt comfort.  Transfers back to recliner and stands x 1 with RW for a brief period of time before sitting.  Seated arom x 10.    Fear remains primary barrier but she did allow for more mobility today than past sessions.  SNF remains appropriate.   Recommendations for follow up therapy are one component of a multi-disciplinary discharge planning process, led by the attending physician.  Recommendations may be updated based on patient status, additional functional criteria and insurance authorization.  Follow Up Recommendations  Skilled nursing-short term rehab (<3 hours/day)     Assistance Recommended at Discharge    Patient can return home with the following A lot of help with bathing/dressing/bathroom;Assist for transportation;Help with stairs or  ramp for entrance;A little help with walking and/or transfers   Equipment Recommendations  Rolling walker (2 wheels);Hospital bed;Wheelchair cushion (measurements PT);BSC/3in1    Recommendations for Other Services       Precautions / Restrictions Precautions Precautions: Fall Restrictions Weight Bearing Restrictions: No     Mobility  Bed Mobility Overal bed mobility: Modified Independent                  Transfers Overall transfer level: Needs assistance Equipment used: None Transfers: Bed to chair/wheelchair/BSC, Sit to/from Stand Sit to Stand: Min assist Stand pivot transfers: Min assist, Mod assist        Lateral/Scoot Transfers: Min guard      Ambulation/Gait                   Stairs             Wheelchair Mobility    Modified Rankin (Stroke Patients Only)       Balance Overall balance assessment: Needs assistance Sitting-balance support: Feet unsupported, No upper extremity supported Sitting balance-Leahy Scale: Good     Standing balance support: Bilateral upper extremity supported, Reliant on assistive device for balance Standing balance-Leahy Scale: Fair                              Cognition Arousal/Alertness: Awake/alert Behavior During Therapy: Anxious Overall Cognitive Status: Within Functional Limits for tasks assessed  Exercises      General Comments        Pertinent Vitals/Pain Pain Assessment Pain Assessment: No/denies pain    Home Living                          Prior Function            PT Goals (current goals can now be found in the care plan section) Progress towards PT goals: Progressing toward goals    Frequency    Min 2X/week      PT Plan Current plan remains appropriate    Co-evaluation              AM-PAC PT "6 Clicks" Mobility   Outcome Measure  Help needed turning from your back to your  side while in a flat bed without using bedrails?: None Help needed moving from lying on your back to sitting on the side of a flat bed without using bedrails?: None Help needed moving to and from a bed to a chair (including a wheelchair)?: A Little Help needed standing up from a chair using your arms (e.g., wheelchair or bedside chair)?: A Little Help needed to walk in hospital room?: A Lot Help needed climbing 3-5 steps with a railing? : Total 6 Click Score: 17    End of Session Equipment Utilized During Treatment: Gait belt Activity Tolerance: Patient tolerated treatment well Patient left: in chair;with call bell/phone within reach;with chair alarm set;with nursing/sitter in room Nurse Communication: Mobility status PT Visit Diagnosis: Other abnormalities of gait and mobility (R26.89);Muscle weakness (generalized) (M62.81)     Time: 4098-1191 PT Time Calculation (min) (ACUTE ONLY): 23 min  Charges:  $Therapeutic Exercise: 8-22 mins $Therapeutic Activity: 8-22 mins                    Chesley Noon, PTA 07/16/21, 1:05 PM

## 2021-07-16 NOTE — NC FL2 (Signed)
[place] LEVEL OF CARE SCREENING TOOL     IDENTIFICATION  Patient Name: Amber [name] Birthdate: 13-Jun-1945 Sex: female Admission Date (Current Location): 06/19/2021  [facility] and [place] Number:  Engineering geologist and Address:  [facility], [address] [place], [place] [address]      Provider Number: [id number]  Attending Physician Name and Address:  Loletha Grayer, MD  Relative Name and Phone Number:  [name] (Daughter)   [phone] (Mobile)    Current Level of Care: Hospital Recommended Level of Care: [place] Prior Approval Number:    Date Approved/Denied:   PASRR Number: [id number]  Discharge Plan: SNF    Current Diagnoses: Patient Active Problem List   Diagnosis Date Noted   Tachycardia 07/14/2021   C. 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[place]) 06/19/2021   Failure to thrive (child) 06/19/2021   Nasogastric tube present 06/19/2021   Chronic deep vein thrombosis (DVT) ([place]) 06/19/2021   Chronic anticoagulation 06/19/2021   ABLA (acute blood loss anemia) 06/19/2021   Acute urinary retention 06/19/2021   Foley catheter in place 06/19/2021   Stage 3a chronic kidney disease ([place]) 06/19/2021   At high risk for inadequate nutritional intake 06/19/2021   Gross hematuria 06/19/2021   MDD (major depressive disorder), single episode, severe with psychotic features ([place])    Dysphagia    COVID-19 virus infection 06/08/2021    Major depressive disorder, recurrent episode, severe ([place]) 06/09/2021   Failure to thrive in adult 06/08/2021   Hypernatremia 06/08/2021   Primary hyperparathyroidism ([place]) 06/07/2021   Hyperthyroidism 04/25/2021   Encounter for examination following treatment at hospital 04/25/2021   Gastroesophageal reflux disease 04/25/2021    Gait abnormality 03/29/2021   Urinary incontinence 03/29/2021   Moderate protein-calorie malnutrition ([place]) 03/29/2021   Depression with anxiety 03/29/2021   Aortic atherosclerosis ([place]) 03/29/2021   Adnexal mass 03/29/2021    Orientation RESPIRATION BLADDER Height & Weight     Self, Situation, Place  Normal Incontinent, External catheter Weight: 64.1 kg Height:  5\' 6"  (167.6 cm)  BEHAVIORAL SYMPTOMS/MOOD NEUROLOGICAL BOWEL NUTRITION STATUS      Incontinent Diet (Soft Diet)  AMBULATORY STATUS COMMUNICATION OF NEEDS Skin   Extensive Assist (2 person assist with walking and transfers) Verbally                         Personal Care Assistance Level of Assistance  Bathing, Feeding, Dressing Bathing Assistance: Maximum assistance (2 person assist with bathing and dressing)   Dressing Assistance: Maximum assistance (2 person assist with bathing and dressing)     Functional Limitations Info  Sight, Hearing, Speech Sight Info: Adequate Hearing Info: Adequate Speech Info: Impaired (missing teeth)    SPECIAL CARE FACTORS FREQUENCY  PT (By licensed PT), OT (By licensed OT)     PT Frequency: Min 5x weekly OT Frequency: Min 5x weekly            Contractures Contractures Info: Not present    Additional Factors Info  Code Status, Allergies Code Status Info: DNR Allergies Info: Shellfish           Current Medications (07/16/2021):  This is the current hospital active medication list Current Facility-Administered Medications  Medication Dose Route Frequency Provider Last Rate Last Admin   acetaminophen (TYLENOL) tablet 650 mg  650 mg Oral Q6H PRN Athena Masse, MD   650 mg at 07/05/21 2203   Or   acetaminophen (TYLENOL) suppository 650 mg  650 mg Rectal Q6H PRN Athena Masse, MD       ALPRAZolam Duanne Moron) tablet 0.25 mg  0.25 mg Oral BID PRN Loletha Grayer, MD   0.25 mg at 07/16/21 1502   apixaban (ELIQUIS) tablet 5 mg  5 mg Oral BID Sheikh, Omair [place], DO   5 mg  at 07/16/21 1030   artificial tears (LACRILUBE) ophthalmic ointment   Both Eyes Q3H PRN Sharion Settler, NP   Given at 06/30/21 2130   diphenhydrAMINE (BENADRYL) injection 50 mg  50 mg Intravenous Once Clapacs, Madie [place], MD       feeding supplement (BOOST / RESOURCE BREEZE) liquid 1 Container  1 Container Oral TID BM Loletha Grayer, MD   1 Container at 07/13/21 2130   ferrous sulfate tablet 325 mg  325 mg Oral Q breakfast Loletha Grayer, MD   325 mg at 07/16/21 1030   menthol-cetylpyridinium (CEPACOL) lozenge 3 mg  1 lozenge Oral PRN Loletha Grayer, MD       metoprolol succinate (TOPROL-XL) 24 hr tablet 12.5 mg  12.5 mg Oral Daily Wieting, Richard, MD   12.5 mg at 07/16/21 1029   mirtazapine (REMERON) tablet 30 mg  30 mg Oral QHS Loletha Grayer, MD   30 mg at 07/15/21 2020   multivitamin with minerals tablet 1 tablet  1 tablet Oral Daily Enzo Bi, MD   1 tablet at 07/16/21 1039   OLANZapine zydis (ZYPREXA) disintegrating tablet 5 mg  5 mg Oral QHS Clapacs, John T, MD   5 mg at 07/15/21 2020   ondansetron (ZOFRAN) tablet 4 mg  4 mg Oral Q6H PRN Athena Masse, MD       Or   ondansetron [facility]) injection 4 mg  4 mg Intravenous Q6H PRN Athena Masse, MD   4 mg at 07/05/21 7903   sodium bicarbonate tablet 650 mg  650 mg Oral BID Sharen Hones, MD   650 mg at 07/16/21 1031   tamsulosin (FLOMAX) capsule 0.4 mg  0.4 mg Oral QPC supper Athena Masse, MD   0.4 mg at 07/15/21 1757   thiamine tablet 100 mg  100 mg Oral Daily Enzo Bi, MD   100 mg at 07/16/21 1030   vitamin B-12 (CYANOCOBALAMIN) tablet 1,000 mcg  1,000 mcg Oral Daily Loletha Grayer, MD   1,000 mcg at 07/16/21 1030   Vitamin D (Ergocalciferol) (DRISDOL) capsule 50,000 Units  50,000 Units Oral Q7 days Athena Masse, MD   50,000 Units at 07/13/21 0002     Discharge Medications: Please see discharge summary for a list of discharge medications.  Relevant Imaging Results:  Relevant Lab Results:   Additional  Information SSN [place] Janin Kozlowski, RN

## 2021-07-17 NOTE — Discharge Summary (Signed)
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 difficile colitis    Hospital Course: The patient is a 76 year old Caucasian female with a past medical history significant for but not due to CKD stage IIIa, hypertension, vitamin D deficiency, chronic DVT on Eliquis, history of psychotic depression with refusal to eat who was admitted as a transfer from [facility] for ECT by Dr. Weber [name] of psychiatry.     She was admitted to [facility] from 06/08/21-06/19/21 after presenting with failure to thrive following a several week history of complaints of difficulty swallowing with decreased oral intake and subsequent development of confusion and hallucinations.  She had outpatient work-up include brain MRI as well as MRV which was unremarkable.  On admission she is found to have an AKI and several electrolyte abnormalities.  While at [facility] she refused to eat and refused further evaluation with esophagram to evaluate her sensation of  dysphagia.  She is evaluated by psychiatry who determined her to not have capacity for medical decision-making and so an NG tube was placed for force-feeding.  Palliative care consult was also done.  Due to her poor response to oral antidepressants ECG was recommended and thus arranged to transfer to [facility].  On 06/08/2020 she tested positive for COVID and she received 3 days of remdesivir.  Most of her nutritional deficiencies related to inadequate oral intake were corrected prior to transfer and on the day of discharge she developed acute urinary retention a Foley catheter was placed and it was traumatic insertion with gross development of gross hematuria.  Hemoglobin did drop to 7.0    On the afternoon of 06/21/21 she spiked a temperature of 101.9 and became tachycardic and slightly hypotensive.  She is given IV fluid bolus and she is pancultured.  Further work-up revealed that she had a right lower lobe pneumonia so she was started on IV Unasyn and completed a course.    Given that she continues to have some mild loose stools ID felt that since the patient was positive toxin producing C. difficile empirically treat with vancomycin for 10 days.  Foley catheter has been removed.  The patient does have a lot of somatic complaints including jitteriness, nausea, difficulty swallowing.  I think this is anxiety about going to a new facility.   Recommend checking CBC and BMP in 1 week.  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  Continue Zyprexa and Remeron.  With the jitteriness, I decreased Remeron dose down  to 30 mg.  Medically stable for discharge. Pasrr paperwork obtained yesterday late afternoon..  Patient very anxious at this point with a lot of somatic complaints including nausea and jitteriness not feeling well and trouble swallowing.  Patient needs reassurance.  Tachycardia Continue low-dose Toprol-XL.  Cardiac enzymes negative.  Jitteriness  secondary to anxiety.  COVID-19 virus infection 06/08/2021 Completed isolation here in the hospital.  Moderate protein-calorie malnutrition ([place])- (present on admission) Patient on Remeron.  Megace discontinued because she did not like the taste of this medication.  Aspiration pneumonia of right lung due to gastric secretions [facility]) Completed treatment during the hospital course.  Stage 3a chronic kidney disease (HCC) Last creatinine 1.52.  Continue sodium bicarb.  ABLA (acute blood loss anemia) Last hemoglobin 8.3.  Vitamin B12 level also low at 135.  Continue B12 supplementation.  I gave IV iron a couple days ago.  Acute urinary retention Patient had Foley catheter during the hospital course.  This has been discontinued. The Pseudomonas UTI was not treated secondary to the patient having a Foley catheter at that time.  Gross hematuria CT scan and ultrasound showed nonobstructive kidney stones.  Outpatient urology follow-up.  Primary hyperparathyroidism ([place])- (present on admission) Last calcium in the normal range.  We will recheck labs on 07/15/2020.  C. difficile colitis The patient completed a 10-day course of oral vancomycin.  No further diarrhea.  Chronic deep vein thrombosis (DVT) (HCC) Patient on Eliquis.           Consultants: Psychiatry Procedures performed: Completing ECT treatment course Disposition: Rehabilitation facility Diet recommendation:  Regular diet  DISCHARGE MEDICATION: Allergies as of 07/17/2021       Reactions   Shellfish Allergy    "I pass out"        Medication List     STOP taking these medications    cyanocobalamin 1000 MCG/ML injection Commonly known as: (VITAMIN B-12) Replaced by: cyanocobalamin 1000 MCG tablet   enoxaparin 80 MG/0.8ML injection Commonly known as: LOVENOX   feeding supplement (OSMOLITE 1.2 CAL) Liqd   free water Soln   haloperidol lactate 5 MG/ML injection Commonly known as: HALDOL   multivitamin  Liqd   sodium chloride 0.9 % SOLN 50 mL with thiamine 100 MG/ML SOLN 500 mg       TAKE these medications    acetaminophen 500 MG tablet Commonly known as: TYLENOL Take 500 mg by mouth every 6 (six) hours as needed for moderate pain or mild pain.   ALPRAZolam 0.25 MG tablet Commonly known as: XANAX Take 1 tablet (0.25 mg total) by mouth 2 (two) times daily as needed for anxiety.   apixaban 5 MG Tabs tablet Commonly known as: ELIQUIS Take 1 tablet (5 mg total) by mouth 2 (two) times daily.   artificial tears Oint ophthalmic ointment Commonly known as: LACRILUBE Place into both eyes every 3 (three) hours as needed for dry eyes.   cyanocobalamin 1000 MCG tablet Take 1 tablet (1,000 mcg total) by mouth daily. Replaces: cyanocobalamin 1000 MCG/ML injection   ferrous sulfate 325 (65 FE) MG tablet Take 1 tablet (325 mg total) by mouth daily with breakfast.   menthol-cetylpyridinium 3 MG lozenge Commonly known as: CEPACOL Take 1 lozenge (3 mg total) by mouth as needed for sore throat.   metoprolol succinate 25 MG 24 hr tablet Commonly known as: TOPROL-XL Take 0.5 tablets (12.5 mg total) by mouth daily.   mirtazapine 30 MG tablet Commonly known as: REMERON Take 1 tablet (30 mg total) by  mouth at bedtime. What changed:  medication strength how much to take how to take this   multivitamin with minerals Tabs tablet Take 1 tablet by mouth daily.   OLANZapine zydis 5 MG disintegrating tablet Commonly known as: ZYPREXA Take 1 tablet (5 mg total) by mouth at bedtime.   ondansetron 4 MG tablet Commonly known as: ZOFRAN Take 1 tablet (4 mg total) by mouth every 6 (six) hours as needed for nausea.   sodium bicarbonate 650 MG tablet Take 1 tablet (650 mg total) by mouth 2 (two) times daily. What changed: how to take this   tamsulosin 0.4 MG Caps capsule Commonly known as: FLOMAX Take 1 capsule (0.4 mg total) by mouth daily after supper.   thiamine 100 MG tablet Take 1  tablet (100 mg total) by mouth daily.   Vitamin D (Ergocalciferol) 1.25 MG (50000 UNIT) Caps capsule Commonly known as: DRISDOL Take 1 capsule (50,000 Units total) by mouth every 7 (seven) days.        Contact information for follow-up providers     Hollice Espy, MD Follow up in 3 week(s).   Specialty: Urology Why: hematuria Contact information: [place] [id number] [phone]         Sindy Guadeloupe, MD Follow up in 3 week(s).   Specialty: Oncology Why: anemia follow up Contact information: [place] [address] [phone]              Contact information for after-discharge care     Destination     [facility] .   Service: Skilled Nursing Contact information: [place] [phone]                     Discharge Exam: Filed Weights   07/09/21 1321 07/11/21 0500 07/12/21 0500  Weight: 62.9 kg 63.9 kg 64.1 kg   Physical Exam Cardiovascular:     Rate and Rhythm: Normal rate and regular rhythm.     Heart sounds: Normal heart sounds, S1 normal and S2 normal.  Pulmonary:     Breath sounds: No decreased breath sounds, wheezing, rhonchi or rales.  Abdominal:     Palpations: Abdomen is soft.     Tenderness: There is no abdominal tenderness.  Musculoskeletal:     Right lower leg: No swelling.     Left lower leg: No swelling.  Skin:    General: Skin is warm.     Findings: No rash.  Neurological:     Mental Status: She is alert.     Comments: Answers some simple yes/no questions appropriately.     Condition at discharge: stable  The results of significant diagnostics from this hospitalization (including imaging, microbiology, ancillary and laboratory) are listed below for reference.   Imaging Studies: CT CHEST W CONTRAST  Result Date: 06/24/2021 CLINICAL DATA:  Lung nodule, > 64mm Lung Nodule EXAM: CT CHEST WITH CONTRAST  TECHNIQUE: Multidetector CT imaging of the chest was performed during intravenous contrast administration. RADIATION DOSE REDUCTION: This exam was performed according to the departmental dose-optimization program which includes automated exposure control, adjustment of the mA and/or kV according to patient size and/or use of iterative reconstruction technique. CONTRAST:  70mL OMNIPAQUE IOHEXOL 300 MG/ML  SOLN COMPARISON:  Same day radiograph. FINDINGS: Cardiovascular: There is peripherally located pulmonary embolism located in both main pulmonary arteries. This is nonocclusive. No CT evidence of RIGHT heart strain. Heart is enlarged. Three-vessel  coronary artery atherosclerotic calcifications. No pericardial effusion. LEFT vertebral artery arises directly from the aorta. Mediastinum/Nodes: LEFT thyroid nodule measuring approximately 20 mm. No suspicious axillary or mediastinal adenopathy. Lungs/Pleura: No pleural effusion or pneumothorax. Nodular opacity of the superior aspect of the LEFT lower lobe demonstrates several internal lucencies and spans 15 by 10 mm (series 2, image 50). Additional scattered ground-glass opacities throughout the LEFT lower lobe. Lingular atelectasis. Upper Abdomen: LEFT hepatic cyst. Enteric tube tip terminates in the duodenum. Diverticulosis. Musculoskeletal: Degenerative changes of the thoracic spine. IMPRESSION: 1. There is peripherally located pulmonary embolism located most centrally within the main branches of the pulmonary artery. It is nonocclusive. No CT evidence of RIGHT heart strain. This is consistent with age-indeterminate pulmonary emboli. 2. Nodular opacity seen on chest radiograph corresponds to a ground-glass opacity with internal lucency in the superior aspect of the LEFT lower lobe. Differential considerations include infection, sequela of pulmonary infarction, or neoplasm. Recommend follow-up CT in 3 months. This recommendation follows the consensus statement:  Guidelines for Management of Incidental Pulmonary Nodules Detected on CT Images: From the Fleischner Society 2017; Radiology 2017; 284:228-243. 3. There is a 2 cm LEFT thyroid nodule. Recommend nonemergent thyroid US (ref: J Am Coll Radiol. 2015 Feb;12(2): 143-50). Aortic Atherosclerosis (ICD10-I70.0). These results were called by telephone at the time of interpretation on 06/24/2021 at 6:06 pm to provider [facility] , who verbally acknowledged these results. Electronically Signed   By: Valentino Saxon M.D.   On: 06/24/2021 18:11   DG Chest Port 1 View  Result Date: 06/27/2021 CLINICAL DATA:  Exam ordered for SOB EXAM: PORTABLE CHEST - 1 VIEW COMPARISON:  06/24/2021 FINDINGS: Some increase in patchy opacities at the left lung base. Blunting of the left lateral costophrenic angle suggesting effusion. Right lung clear. Heart size upper limits normal for technique. Interval removal of feeding tube. Visualized bones unremarkable. IMPRESSION: Possible small left effusion, with patchy opacities at the left lung base. Electronically Signed   By: Lucrezia Europe M.D.   On: 06/27/2021 08:09   DG Chest Port 1 View  Result Date: 06/24/2021 CLINICAL DATA:  Pre ECT EXAM: PORTABLE CHEST 1 VIEW COMPARISON:  June 23, 2021 FINDINGS: The cardiomediastinal silhouette is unchanged in contour.Tortuous thoracic aorta. The enteric tube courses through the chest to the abdomen beyond the field-of-view. No pleural effusion. No pneumothorax. Similar appearance of a LEFT upper lung nodular opacity. Scattered linear opacities most consistent with atelectasis. Visualized abdomen is unremarkable. IMPRESSION: 1. Stable chest radiograph. Similar appearance of a possible nodular opacity versus summation artifact of overlapping vascular shadows of the LEFT upper lung. When clinically appropriate, chest CT with contrast is recommended versus follow-up PA and lateral chest radiograph in 3-4 weeks. Electronically Signed   By: Valentino Saxon M.D.   On: 06/24/2021 17:17   DG Chest Port 1 View  Result Date: 06/23/2021 CLINICAL DATA:  Shortness of breath. EXAM: PORTABLE CHEST 1 VIEW COMPARISON:  Portable chest 06/20/2021. FINDINGS: 4:15 a.m., 06/23/2021. Cardiomegaly. The central vessels are normal in caliber. Stable mediastinum with aortic tortuosity and ectasia. The lungs are clear of focal infiltrates, with linear scarring or atelectasis left mid field again noted. There is an irregular left upper lobe opacity measuring 1.7 x 0.8 cm. In retrospect this may have been present on earlier studies. Findings concerning for pulmonary nodule. Chest CT recommended preferably with contrast. No pleural effusion is seen. Osteopenia thoracic spondylosis. Feeding tube enters the stomach. IMPRESSION: 1.7 x 0.8 cm left upper lobe opacity  concerning for nodule. Chest CT recommended, preferably with contrast. No appreciable focal pneumonia. Cardiomegaly. Electronically Signed   By: Telford Nab M.D.   On: 06/23/2021 06:06   DG Chest Port 1 View  Result Date: 06/20/2021 CLINICAL DATA:  Fever EXAM: PORTABLE CHEST 1 VIEW COMPARISON:  06/11/2021 FINDINGS: Transverse diameter of heart is increased. Thoracic aorta is tortuous and ectatic. There is poor inspiration which may account for slight prominence of central pulmonary vessels. There are linear densities in the left parahilar region. New patchy infiltrates are seen in the right lower lung fields. There is no pleural effusion or pneumothorax. Enteric tube is noted traversing the thoracic esophagus. Skin fold is noted in the lateral aspect of left lower lung fields. IMPRESSION: There is new patchy infiltrate in right lower lung fields suggesting atelectasis/pneumonia. There are transverse linear densities in the left parahilar region suggesting scarring or subsegmental atelectasis or lung nodules. Electronically Signed   By: Elmer Picker M.D.   On: 06/20/2021 16:59    Microbiology: Results for  orders placed or performed during the hospital encounter of 06/19/21  CULTURE, BLOOD (ROUTINE X 2) w Reflex to ID Panel     Status: None   Collection Time: 06/20/21  4:42 PM   Specimen: BLOOD RIGHT ARM  Result Value Ref Range Status   Specimen Description BLOOD RIGHT ARM  Final   Special Requests   Final    BOTTLES DRAWN AEROBIC AND ANAEROBIC Blood Culture adequate volume   Culture   Final    NO GROWTH 5 DAYS Performed at [facility], [address] [place], [place] [address]    Report Status 06/25/2021 FINAL  Final  CULTURE, BLOOD (ROUTINE X 2) w Reflex to ID Panel     Status: None   Collection Time: 06/20/21  4:53 PM   Specimen: BLOOD RIGHT HAND  Result Value Ref Range Status   Specimen Description BLOOD RIGHT HAND  Final   Special Requests   Final    BOTTLES DRAWN AEROBIC AND ANAEROBIC Blood Culture adequate volume   Culture   Final    NO GROWTH 5 DAYS Performed at [facility], [address] [place], [place] [address]    Report Status 06/25/2021 FINAL  Final  Urine Culture     Status: Abnormal   Collection Time: 06/21/21  7:30 AM   Specimen: Urine, Random  Result Value Ref Range Status   Specimen Description   Final    URINE, RANDOM Performed at [facility], [address] [place], [place] [address]    Special Requests   Final    NONE Performed at [facility], [address] [place], [place] [address]    Culture 70,000 COLONIES/mL PSEUDOMONAS AERUGINOSA (A)  Final   Report Status 06/23/2021 FINAL  Final   Organism ID, Bacteria PSEUDOMONAS AERUGINOSA (A)  Final      Susceptibility   Pseudomonas aeruginosa - MIC*    CEFTAZIDIME 2 SENSITIVE Sensitive     CIPROFLOXACIN <=0.25 SENSITIVE Sensitive     GENTAMICIN <=1 SENSITIVE Sensitive     IMIPENEM 2 SENSITIVE Sensitive     PIP/TAZO <=4 SENSITIVE Sensitive     CEFEPIME 2 SENSITIVE Sensitive     * 70,000 COLONIES/mL PSEUDOMONAS AERUGINOSA  C Difficile Quick Screen w PCR  reflex     Status: Abnormal   Collection Time: 06/21/21  1:48 PM   Specimen: STOOL  Result Value Ref Range Status   C Diff antigen POSITIVE (A) NEGATIVE  Final   C Diff toxin POSITIVE (A) NEGATIVE Final   C Diff interpretation Toxin producing C. difficile detected.  Final    Comment: CRITICAL RESULT CALLED TO, READ BACK BY AND VERIFIED WITH: [name] HUSKNIS 06/21/21 1455 Performed at [facility], [address] [place], [place] [address]     Labs: CBC: Recent Labs  Lab 07/15/21 0437 07/16/21 0558  WBC 5.9  --   HGB 7.9* 8.3*  HCT 25.2*  --   MCV 93.0  --   PLT 217  --    Basic Metabolic Panel: Recent Labs  Lab 07/11/21 0455 07/14/21 1704 07/15/21 0437  NA 141 141 142  K 3.5 3.6 3.5  CL 116* 112* 114*  CO2 22 24 23   GLUCOSE 85 98 92  BUN 30* 37* 35*  CREATININE 1.50* 1.59* 1.52*  CALCIUM 9.6 9.7 9.5    CBG: Recent Labs  Lab 07/13/21 0833  GLUCAP 79    Discharge time spent: greater than 30 minutes. Updated daughter on the phone.  Signed: Loletha Grayer, MD [facility] 07/17/2021

## 2021-07-17 NOTE — Progress Notes (Signed)
EMS on site to transport patient to [facility]. Attempt made to call report x2 without success.

## 2021-07-17 NOTE — TOC Progression Note (Signed)
Transition of Care [facility]) - Progression Note    Patient Details  Name: SKYANN GANIM MRN: [id number] Date of Birth: 12/16/1945  Transition [facility]) CM/SW [place], RN Phone Number: 07/17/2021, 8:14 AM  Clinical Narrative:   [facility] able to take patient today, as per Star and ADON at facility.  [facility] able to transport patient this AM.  Care team and family aware.    Expected Discharge Plan:  (TBD) Barriers to Discharge: Barriers Resolved  Expected Discharge Plan and Services Expected Discharge Plan:  (TBD)   Discharge Planning Services: CM Consult Post Acute Care Choice:  (TBD) Living arrangements for the past 2 months: Single Family Home Expected Discharge Date: 07/13/21                                     Social Determinants of Health (SDOH) Interventions    Readmission Risk Interventions No flowsheet data found.

## 2021-07-23 ENCOUNTER — Encounter (HOSPITAL_COMMUNITY): Payer: Self-pay | Admitting: Radiology

## 2021-07-25 ENCOUNTER — Telehealth: Payer: Self-pay | Admitting: Hematology and Oncology

## 2021-07-25 NOTE — Telephone Encounter (Signed)
Scheduled appt per 2/27 referral. Spoke to [place] at [facility] who will pass along appointment to both pt and pt's daughter. She will also make sure pt arrives 15 mins prior to appt time. Marland Kitchen  ?

## 2021-07-31 ENCOUNTER — Ambulatory Visit: Payer: Self-pay | Admitting: Urology

## 2021-08-01 ENCOUNTER — Telehealth: Payer: Self-pay

## 2021-08-01 ENCOUNTER — Other Ambulatory Visit: Payer: Self-pay | Admitting: Internal Medicine

## 2021-08-01 ENCOUNTER — Encounter: Payer: Self-pay | Admitting: Internal Medicine

## 2021-08-01 DIAGNOSIS — Z9889 Other specified postprocedural states: Secondary | ICD-10-CM

## 2021-08-01 DIAGNOSIS — F333 Major depressive disorder, recurrent, severe with psychotic symptoms: Secondary | ICD-10-CM

## 2021-08-01 NOTE — Telephone Encounter (Signed)
Daughter called about setting up an appt. Explained referral process ?

## 2021-08-02 ENCOUNTER — Other Ambulatory Visit: Payer: Self-pay | Admitting: *Deleted

## 2021-08-02 DIAGNOSIS — F333 Major depressive disorder, recurrent, severe with psychotic symptoms: Secondary | ICD-10-CM

## 2021-08-02 DIAGNOSIS — F418 Other specified anxiety disorders: Secondary | ICD-10-CM

## 2021-08-02 DIAGNOSIS — F323 Major depressive disorder, single episode, severe with psychotic features: Secondary | ICD-10-CM

## 2021-08-02 NOTE — Telephone Encounter (Signed)
Spoke with Amber [name] she was concerned about the psychiatry referral that was placed she is not happy with that provider and would like a different one. Dr Casimiro Needle is the one that she was referred to for April 11 at 330. She saw dr Weber Cooks in [place] he is not taking new pt however they have other providers taking new pt at practice can we refer back out to [facility]. Please advise  ?

## 2021-08-08 NOTE — Progress Notes (Signed)
[facility] ?CONSULT NOTE ? ?Patient Care Team: ?Lindell Spar, MD as PCP - General (Internal Medicine) ?Mauri Pole, MD as Consulting Physician (Gastroenterology) ? ?CHIEF COMPLAINTS/PURPOSE OF CONSULTATION: Anemia ? ?HISTORY OF PRESENTING ILLNESS:  ?Amber [name] 76 y.o. female is here because of recent diagnosis of Anemia. She presents to the clinic today to discuss a plan. ?She was recently hospitalized from 06/19/2021 to 07/17/2021 for severe depression followed by complications including COVID pneumonia and C. difficile enterocolitis.  Through all of this hospitalization she became severely anemic.  She was started on oral iron therapy.  She is still fairly weak and is in a wheelchair.  She was referred to [place] for evaluation and treatment of the anemia. ? ?I reviewed her records extensively and collaborated the history with the patient. ?  ? ? ?MEDICAL HISTORY:  ?Past Medical History:  ?Diagnosis Date  ? Anxiety   ? Hypertension   ? ? ?SURGICAL HISTORY: ?No past surgical history on file. ? ?SOCIAL HISTORY: ?Social History  ? ?Socioeconomic History  ? Marital status: Married  ?  Spouse name: Not on file  ? Number of children: Not on file  ? Years of education: Not on file  ? Highest education level: Not on file  ?Occupational History  ? Not on file  ?Tobacco Use  ? Smoking status: Former  ?  Types: Cigarettes  ? Smokeless tobacco: Never  ?Vaping Use  ? Vaping Use: Never used  ?Substance and Sexual Activity  ? Alcohol use: Not Currently  ? Drug use: Not Currently  ? Sexual activity: Not Currently  ?Other Topics Concern  ? Not on file  ?Social History Narrative  ? Right handed   ? one story home   ? lives with family   ? Doesn't work currently  ? ?Social Determinants of Health  ? ?Financial Resource Strain: Not on file  ?Food Insecurity: Not on file  ?Transportation Needs: Not on file  ?Physical Activity: Not on file  ?Stress: Not on file  ?Social Connections: Not on file  ?Intimate Partner  Violence: Not on file  ? ? ?FAMILY HISTORY: ?Family History  ?Problem Relation Age of Onset  ? High blood pressure Maternal Grandmother   ? ? ?ALLERGIES:  is allergic to shellfish allergy. ? ?MEDICATIONS:  ?Current Outpatient Medications  ?Medication Sig Dispense Refill  ? acetaminophen (TYLENOL) 500 MG tablet Take 500 mg by mouth every 6 (six) hours as needed for moderate pain or mild pain.    ? ALPRAZolam (XANAX) 0.25 MG tablet Take 1 tablet (0.25 mg total) by mouth 2 (two) times daily as needed for anxiety. 30 tablet 0  ? apixaban (ELIQUIS) 5 MG TABS tablet Take 1 tablet (5 mg total) by mouth 2 (two) times daily. 60 tablet 0  ? artificial tears (LACRILUBE) OINT ophthalmic ointment Place into both eyes every 3 (three) hours as needed for dry eyes. 7 g 0  ? ferrous sulfate 325 (65 FE) MG tablet Take 1 tablet (325 mg total) by mouth daily with breakfast. 30 tablet 0  ? menthol-cetylpyridinium (CEPACOL) 3 MG lozenge Take 1 lozenge (3 mg total) by mouth as needed for sore throat. 30 tablet 0  ? metoprolol succinate (TOPROL-XL) 25 MG 24 hr tablet Take 0.5 tablets (12.5 mg total) by mouth daily. 15 tablet 0  ? mirtazapine (REMERON) 30 MG tablet Take 1 tablet (30 mg total) by mouth at bedtime. 30 tablet 0  ? Multiple Vitamin (MULTIVITAMIN WITH MINERALS) TABS tablet  Take 1 tablet by mouth daily. 30 tablet 0  ? OLANZapine zydis (ZYPREXA) 5 MG disintegrating tablet Take 1 tablet (5 mg total) by mouth at bedtime. 30 tablet 0  ? ondansetron (ZOFRAN) 4 MG tablet Take 1 tablet (4 mg total) by mouth every 6 (six) hours as needed for nausea. 20 tablet 0  ? sodium bicarbonate 650 MG tablet Take 1 tablet (650 mg total) by mouth 2 (two) times daily. 60 tablet 0  ? tamsulosin (FLOMAX) 0.4 MG CAPS capsule Take 1 capsule (0.4 mg total) by mouth daily after supper. 30 capsule 0  ? thiamine 100 MG tablet Take 1 tablet (100 mg total) by mouth daily. 1 tablet 0  ? vitamin B-12 1000 MCG tablet Take 1 tablet (1,000 mcg total) by mouth daily.  30 tablet 0  ? Vitamin D, Ergocalciferol, (DRISDOL) 1.25 MG (50000 UNIT) CAPS capsule Take 1 capsule (50,000 Units total) by mouth every 7 (seven) days. 5 capsule 0  ? ?No current facility-administered medications for this visit.  ? ? ?REVIEW OF SYSTEMS:   ?Constitutional: Denies fevers, chills or abnormal night sweats ?  ?All other systems were reviewed with the patient and are negative. ? ?PHYSICAL EXAMINATION: ?ECOG PERFORMANCE STATUS: 1 - Symptomatic but completely ambulatory ? ?Vitals:  ? 08/10/21 1358  ?BP: 131/89  ?Pulse: (!) 103  ?Resp: 17  ?Temp: 98.1 ?F (36.7 ?C)  ?SpO2: 100%  ? ?Filed Weights  ? ?  ? ?LABORATORY DATA:  ?I have reviewed the data as listed ?Lab Results  ?Component Value Date  ? WBC 5.9 07/15/2021  ? HGB 8.3 (L) 07/16/2021  ? HCT 25.2 (L) 07/15/2021  ? MCV 93.0 07/15/2021  ? PLT 217 07/15/2021  ? ?Lab Results  ?Component Value Date  ? NA 142 07/15/2021  ? K 3.5 07/15/2021  ? CL 114 (H) 07/15/2021  ? CO2 23 07/15/2021  ? ? ?RADIOGRAPHIC STUDIES: ?I have personally reviewed the radiological reports and agreed with the findings in the report. ? ?ASSESSMENT AND PLAN:  ?Normocytic anemia ? ?Lab review: 07/16/2021: Hemoglobin 8.3, MCV 93 ? ?Hospitalization 06/19/2021-07/17/2021: Depression with refusal to eat and failure to thrive (acute kidney injury), tested positive for COVID, hematuria due to Foley insertion with severe anemia, right lower lobe pneumonia, C. difficile enterocolitis ? ?Diagnosis of anemia: Anemia of chronic disease and acute illness from recent hospitalization ?Plan: To complete work-up we will obtain serum protein electrophoresis, haptoglobin LDH and reticulocyte count to rule out hemolysis, erythropoietin level, iron studies B20 and folic acid ? ?Depending on our lab findings we will pursue treatment plan. ?Patient has severe anxiety disorder and severe depression. ?Telephone visit in 1 week to discuss results ? ?All questions were answered. The patient knows to call the clinic  with any problems, questions or concerns. ?  ? Harriette Ohara, MD ?08/10/21 ? ? I Gardiner Coins, am acting as a scribe for Dr. Lindi Adie  ?

## 2021-08-10 ENCOUNTER — Other Ambulatory Visit: Payer: Self-pay

## 2021-08-10 ENCOUNTER — Inpatient Hospital Stay: Payer: Medicare Other | Attending: Hematology and Oncology | Admitting: Hematology and Oncology

## 2021-08-10 ENCOUNTER — Inpatient Hospital Stay: Payer: Medicare Other

## 2021-08-10 DIAGNOSIS — F32A Depression, unspecified: Secondary | ICD-10-CM | POA: Insufficient documentation

## 2021-08-10 DIAGNOSIS — I10 Essential (primary) hypertension: Secondary | ICD-10-CM | POA: Diagnosis not present

## 2021-08-10 DIAGNOSIS — D649 Anemia, unspecified: Secondary | ICD-10-CM | POA: Insufficient documentation

## 2021-08-10 DIAGNOSIS — F419 Anxiety disorder, unspecified: Secondary | ICD-10-CM | POA: Insufficient documentation

## 2021-08-10 LAB — CBC WITH DIFFERENTIAL (CANCER CENTER ONLY)
Abs Immature Granulocytes: 0.01 10*3/uL (ref 0.00–0.07)
Basophils Absolute: 0 10*3/uL (ref 0.0–0.1)
Basophils Relative: 0 %
Eosinophils Absolute: 0.1 10*3/uL (ref 0.0–0.5)
Eosinophils Relative: 1 %
HCT: 29.7 % — ABNORMAL LOW (ref 36.0–46.0)
Hemoglobin: 9.1 g/dL — ABNORMAL LOW (ref 12.0–15.0)
Immature Granulocytes: 0 %
Lymphocytes Relative: 43 %
Lymphs Abs: 2.2 10*3/uL (ref 0.7–4.0)
MCH: 28.9 pg (ref 26.0–34.0)
MCHC: 30.6 g/dL (ref 30.0–36.0)
MCV: 94.3 fL (ref 80.0–100.0)
Monocytes Absolute: 0.3 10*3/uL (ref 0.1–1.0)
Monocytes Relative: 6 %
Neutro Abs: 2.5 10*3/uL (ref 1.7–7.7)
Neutrophils Relative %: 50 %
Platelet Count: 256 10*3/uL (ref 150–400)
RBC: 3.15 MIL/uL — ABNORMAL LOW (ref 3.87–5.11)
RDW: 13.9 % (ref 11.5–15.5)
WBC Count: 5.1 10*3/uL (ref 4.0–10.5)
nRBC: 0 % (ref 0.0–0.2)

## 2021-08-10 LAB — RETIC PANEL
Immature Retic Fract: 8 % (ref 2.3–15.9)
RBC.: 3.09 MIL/uL — ABNORMAL LOW (ref 3.87–5.11)
Retic Count, Absolute: 40.5 10*3/uL (ref 19.0–186.0)
Retic Ct Pct: 1.3 % (ref 0.4–3.1)
Reticulocyte Hemoglobin: 30.8 pg (ref >27.9)

## 2021-08-10 LAB — IRON AND IRON BINDING CAPACITY (CC-WL,HP ONLY)
Iron: 33 ug/dL (ref 28–170)
Saturation Ratios: 22 % (ref 10.4–31.8)
TIBC: 147 ug/dL — ABNORMAL LOW (ref 250–450)
UIBC: 114 ug/dL — ABNORMAL LOW (ref 148–442)

## 2021-08-10 LAB — VITAMIN B12: Vitamin B-12: 2424 pg/mL — ABNORMAL HIGH (ref 180–914)

## 2021-08-10 LAB — FOLATE: Folate: 9.9 ng/mL (ref >5.9)

## 2021-08-10 NOTE — Assessment & Plan Note (Signed)
Hospitalization 06/19/2021-07/17/2021: Depression with refusal to eat and failure to thrive (acute kidney injury), tested positive for COVID, hematuria due to Foley insertion with severe anemia, right lower lobe pneumonia, C. difficile enterocolitis ? ?Diagnosis of anemia: Anemia of chronic disease and acute illness from recent hospitalization ?Plan: To complete work-up we will obtain serum protein electrophoresis, haptoglobin LDH and reticulocyte count to rule out hemolysis, erythropoietin level, iron studies R04 and folic acid ? ?Marland Kitchen  Depending on our lab findings we will pursue treatment plan. ?

## 2021-08-11 LAB — HAPTOGLOBIN: Haptoglobin: 186 mg/dL (ref 42–346)

## 2021-08-12 LAB — ERYTHROPOIETIN: Erythropoietin: 12.1 m[IU]/mL (ref 2.6–18.5)

## 2021-08-13 DIAGNOSIS — U071 COVID-19: Secondary | ICD-10-CM

## 2021-08-13 DIAGNOSIS — I150 Renovascular hypertension: Secondary | ICD-10-CM

## 2021-08-13 DIAGNOSIS — N390 Urinary tract infection, site not specified: Secondary | ICD-10-CM | POA: Diagnosis not present

## 2021-08-13 DIAGNOSIS — Z7901 Long term (current) use of anticoagulants: Secondary | ICD-10-CM

## 2021-08-13 DIAGNOSIS — F323 Major depressive disorder, single episode, severe with psychotic features: Secondary | ICD-10-CM | POA: Diagnosis not present

## 2021-08-13 DIAGNOSIS — E46 Unspecified protein-calorie malnutrition: Secondary | ICD-10-CM | POA: Diagnosis not present

## 2021-08-13 DIAGNOSIS — F419 Anxiety disorder, unspecified: Secondary | ICD-10-CM | POA: Diagnosis not present

## 2021-08-13 DIAGNOSIS — N1831 Chronic kidney disease, stage 3a: Secondary | ICD-10-CM

## 2021-08-13 LAB — TSH: TSH: <0.08 u[IU]/mL — ABNORMAL LOW (ref 0.308–3.960)

## 2021-08-14 LAB — MULTIPLE MYELOMA PANEL, SERUM
Albumin SerPl Elph-Mcnc: 2.7 g/dL — ABNORMAL LOW (ref 2.9–4.4)
Albumin/Glob SerPl: 1 (ref 0.7–1.7)
Alpha 1: 0.2 g/dL (ref 0.0–0.4)
Alpha2 Glob SerPl Elph-Mcnc: 0.7 g/dL (ref 0.4–1.0)
B-Globulin SerPl Elph-Mcnc: 0.6 g/dL — ABNORMAL LOW (ref 0.7–1.3)
Gamma Glob SerPl Elph-Mcnc: 1.3 g/dL (ref 0.4–1.8)
Globulin, Total: 2.9 g/dL (ref 2.2–3.9)
IgA: 196 mg/dL (ref 64–422)
IgG (Immunoglobin G), Serum: 1236 mg/dL (ref 586–1602)
IgM (Immunoglobulin M), Srm: 147 mg/dL (ref 26–217)
Total Protein ELP: 5.6 g/dL — ABNORMAL LOW (ref 6.0–8.5)

## 2021-08-16 ENCOUNTER — Encounter: Payer: Self-pay | Admitting: Internal Medicine

## 2021-08-16 ENCOUNTER — Other Ambulatory Visit: Payer: Self-pay | Admitting: *Deleted

## 2021-08-16 ENCOUNTER — Ambulatory Visit (INDEPENDENT_AMBULATORY_CARE_PROVIDER_SITE_OTHER): Payer: Medicare Other | Admitting: Internal Medicine

## 2021-08-16 ENCOUNTER — Other Ambulatory Visit: Payer: Self-pay

## 2021-08-16 VITALS — BP 122/82 | HR 123 | Resp 18 | Ht 66.5 in | Wt 136.2 lb

## 2021-08-16 DIAGNOSIS — I2699 Other pulmonary embolism without acute cor pulmonale: Secondary | ICD-10-CM

## 2021-08-16 DIAGNOSIS — F323 Major depressive disorder, single episode, severe with psychotic features: Secondary | ICD-10-CM | POA: Diagnosis not present

## 2021-08-16 DIAGNOSIS — E210 Primary hyperparathyroidism: Secondary | ICD-10-CM

## 2021-08-16 DIAGNOSIS — E440 Moderate protein-calorie malnutrition: Secondary | ICD-10-CM

## 2021-08-16 DIAGNOSIS — R000 Tachycardia, unspecified: Secondary | ICD-10-CM

## 2021-08-16 DIAGNOSIS — G609 Hereditary and idiopathic neuropathy, unspecified: Secondary | ICD-10-CM

## 2021-08-16 DIAGNOSIS — Z9889 Other specified postprocedural states: Secondary | ICD-10-CM | POA: Diagnosis not present

## 2021-08-16 DIAGNOSIS — E0590 Thyrotoxicosis, unspecified without thyrotoxic crisis or storm: Secondary | ICD-10-CM

## 2021-08-16 DIAGNOSIS — N1831 Chronic kidney disease, stage 3a: Secondary | ICD-10-CM | POA: Diagnosis not present

## 2021-08-16 DIAGNOSIS — D649 Anemia, unspecified: Secondary | ICD-10-CM

## 2021-08-16 DIAGNOSIS — F411 Generalized anxiety disorder: Secondary | ICD-10-CM

## 2021-08-16 DIAGNOSIS — M7989 Other specified soft tissue disorders: Secondary | ICD-10-CM

## 2021-08-16 MED ORDER — METOPROLOL SUCCINATE ER 25 MG PO TB24: 12.5000 mg | ORAL_TABLET | Freq: Every day | ORAL | 0 refills | Status: DC

## 2021-08-16 MED ORDER — SODIUM BICARBONATE 650 MG PO TABS: 650.0000 mg | ORAL_TABLET | Freq: Two times a day (BID) | ORAL | 2 refills | Status: DC

## 2021-08-16 MED ORDER — OLANZAPINE 5 MG PO TBDP: 5.0000 mg | ORAL_TABLET | Freq: Every day | ORAL | 1 refills | Status: DC

## 2021-08-16 MED ORDER — APIXABAN 5 MG PO TABS: 5.0000 mg | ORAL_TABLET | Freq: Two times a day (BID) | ORAL | 3 refills | Status: DC

## 2021-08-16 MED ORDER — ALPRAZOLAM 0.5 MG PO TABS: 0.5000 mg | ORAL_TABLET | Freq: Two times a day (BID) | ORAL | 1 refills | Status: DC | PRN

## 2021-08-16 MED ORDER — BUSPIRONE HCL 5 MG PO TABS: 5.0000 mg | ORAL_TABLET | Freq: Two times a day (BID) | ORAL | 5 refills | Status: DC

## 2021-08-16 MED ORDER — MIRTAZAPINE 30 MG PO TABS: 30.0000 mg | ORAL_TABLET | Freq: Every day | ORAL | 1 refills | Status: DC

## 2021-08-16 NOTE — Assessment & Plan Note (Signed)
Uncontrolled with BuSpar and Xanax ?Increased dose of Xanax to 0.5 mg twice daily as needed ?

## 2021-08-16 NOTE — Assessment & Plan Note (Signed)
On Eliquis currently ?Followed by heme-onc. for anemia, denies any active bleeding ?CT chest showed opacity, could be related to infiltrate versus PE, will repeat CT chest after 1 month ?

## 2021-08-16 NOTE — Assessment & Plan Note (Signed)
Likely multifactorial, due to acute blood loss from [facility], PUD and CKD ?Has had PRBC and iron transfusion recently ?Continue oral iron supplement ?Followed by Heme/Onc. - recent CBC showed improvement in Hb ?

## 2021-08-16 NOTE — Assessment & Plan Note (Signed)
Lab Results  ?Component Value Date  ? TSH <0.080 (L) 08/10/2021  ? ?Recheck TSH along with free T4 ?Referred to endocrinology as she has had fluctuating thyroid function profile ?Hyperthyroidism can explain some of her symptoms like severe anxiety, tremors, agitation and weight loss ?

## 2021-08-16 NOTE — Assessment & Plan Note (Signed)
Needs to improve fluid intake ?Will recheck BMP ?Continue vitamin D supplement ?Avoid nephrotoxic agents ?

## 2021-08-16 NOTE — Assessment & Plan Note (Signed)
Likely from nutritional deficiencies ?Will pursue further investigation when she is more stable with other conditions ?

## 2021-08-16 NOTE — Assessment & Plan Note (Signed)
Continue Remeron ?Continue protein supplement ?Needs to avoid skipping meals and improve hydration ?

## 2021-08-16 NOTE — Patient Instructions (Addendum)
Please start taking Xanax 0.5 mg twice daily. ? ?Please continue taking other medications as prescribed. ? ?Please maintain at least 64 ounces of fluid intake in a day. ? ?Please try to keep legs elevated and/or use compression socks for leg swelling. ? ?Please check with Heme/Onc. about duration of Eliquis. ? ?Please get blood tests done after 2 weeks. ?

## 2021-08-16 NOTE — Assessment & Plan Note (Signed)
Likely due to underlying anxiety ?Continue Toprol for now, plan to DC later ?

## 2021-08-16 NOTE — Progress Notes (Addendum)
? ?Established Patient Office Visit ? ?Subjective:  ?Patient ID: DELSA WALDER, female    DOB: 09/07/1945  Age: 76 y.o. MRN: [id number] ? ?CC:  ?Chief Complaint  ?Patient presents with  ? Follow-up  ?  Follow up pt has been admitted since being admitted pt had blood work at hematology office thyroid was off, also while in the hospital had a blood clot in legs that moved to lungs hospitalist said follow up with scan in 3 months wanted to know if this could be ordered cant see psych til may needs meds refilled and has numbness and swelling in feet and ankles  ? ? ?HPI ?KENZIE THORESON is a 76 y.o. female with past medical history of aortic atherosclerosis, depression with anxiety and protein-calorie malnutrition who presents for f/u of her chronic medical conditions. ? ?She was recently admitted in the hospital and was later transferred to psychiatry facility for ECT for psychotic depression.  She has been placed on Zyprexa and Xanax in addition to Remeron.  She states that she has had good and bad days since being discharged from the hospital, but does admit that she feels better than prior.  She is going to see psychiatry in outpatient setting in May.  She currently denies any SI or HI.  Her daughter reports that her appetite has been better lately and she has started gaining weight now.  She is still feels anxious despite taking Xanax. ? ?She c/o numbness of b/l feet, which is chronic. Also has chronic leg swelling. ? ?She has seen urology for hematuria.  She is being treated with levofloxacin for UTI.  She was also given Flomax for nonobstructive nephrolithiasis.  She currently denies any flank pain. ? ?She is also on Toprol for sinus tachycardia, which is likely due to her underlying anxiety.  She currently denies dyspnea at rest, but has exertional dyspnea.  She denies any chest pain currently. ? ?She was also given PRBC due to acute blood loss anemia.  Of note, she is also being treated for DVT/PE with Eliquis.   She currently denies any active signs of bleeding.  Her Hb has improved to 9.1 now.  She also takes iron supplement currently. ? ?She was treated for C. difficile colitis with oral vancomycin during hospital course.  She also had worsening of her kidney function - AKI, and was started on sodium bicarb for acidosis.  She is trying to improve fluid intake as well. ? ? ?Past Medical History:  ?Diagnosis Date  ? ABLA (acute blood loss anemia) 06/19/2021  ? Anxiety   ? Aspiration pneumonia of right lung due to gastric secretions ([place])   ? C. difficile colitis 07/13/2021  ? COVID-19 virus infection 06/08/2021   ? Hypertension   ? ? ?History reviewed. No pertinent surgical history. ? ?Family History  ?Problem Relation Age of Onset  ? High blood pressure Maternal Grandmother   ? ? ?Social History  ? ?Socioeconomic History  ? Marital status: Married  ?  Spouse name: Not on file  ? Number of children: Not on file  ? Years of education: Not on file  ? Highest education level: Not on file  ?Occupational History  ? Not on file  ?Tobacco Use  ? Smoking status: Former  ?  Types: Cigarettes  ? Smokeless tobacco: Never  ?Vaping Use  ? Vaping Use: Never used  ?Substance and Sexual Activity  ? Alcohol use: Not Currently  ? Drug use: Not Currently  ? Sexual activity:  Not Currently  ?Other Topics Concern  ? Not on file  ?Social History Narrative  ? Right handed   ? one story home   ? lives with family   ? Doesn't work currently  ? ?Social Determinants of Health  ? ?Financial Resource Strain: Not on file  ?Food Insecurity: Not on file  ?Transportation Needs: Not on file  ?Physical Activity: Not on file  ?Stress: Not on file  ?Social Connections: Not on file  ?Intimate Partner Violence: Not on file  ? ? ?Outpatient Medications Prior to Visit  ?Medication Sig Dispense Refill  ? acetaminophen (TYLENOL) 500 MG tablet Take 500 mg by mouth every 6 (six) hours as needed for moderate pain or mild pain.    ? artificial tears (LACRILUBE) OINT  ophthalmic ointment Place into both eyes every 3 (three) hours as needed for dry eyes. 7 g 0  ? ferrous sulfate 325 (65 FE) MG tablet Take 1 tablet (325 mg total) by mouth daily with breakfast. 30 tablet 0  ? levofloxacin (LEVAQUIN) 500 MG tablet Take 500 mg by mouth daily.    ? Multiple Vitamin (MULTIVITAMIN WITH MINERALS) TABS tablet Take 1 tablet by mouth daily. 30 tablet 0  ? ondansetron (ZOFRAN) 4 MG tablet Take 1 tablet (4 mg total) by mouth every 6 (six) hours as needed for nausea. 20 tablet 0  ? tamsulosin (FLOMAX) 0.4 MG CAPS capsule Take 1 capsule (0.4 mg total) by mouth daily after supper. 30 capsule 0  ? Vitamin D, Ergocalciferol, (DRISDOL) 1.25 MG (50000 UNIT) CAPS capsule Take 1 capsule (50,000 Units total) by mouth every 7 (seven) days. 5 capsule 0  ? ALPRAZolam (XANAX) 0.25 MG tablet Take 1 tablet (0.25 mg total) by mouth 2 (two) times daily as needed for anxiety. 30 tablet 0  ? apixaban (ELIQUIS) 5 MG TABS tablet Take 1 tablet (5 mg total) by mouth 2 (two) times daily. 60 tablet 0  ? busPIRone (BUSPAR) 5 MG tablet Take 5 mg by mouth 2 (two) times daily.    ? menthol-cetylpyridinium (CEPACOL) 3 MG lozenge Take 1 lozenge (3 mg total) by mouth as needed for sore throat. 30 tablet 0  ? metoprolol succinate (TOPROL-XL) 25 MG 24 hr tablet Take 0.5 tablets (12.5 mg total) by mouth daily. 15 tablet 0  ? mirtazapine (REMERON) 30 MG tablet Take 1 tablet (30 mg total) by mouth at bedtime. 30 tablet 0  ? OLANZapine zydis (ZYPREXA) 5 MG disintegrating tablet Take 1 tablet (5 mg total) by mouth at bedtime. 30 tablet 0  ? sodium bicarbonate 650 MG tablet Take 1 tablet (650 mg total) by mouth 2 (two) times daily. 60 tablet 0  ? thiamine 100 MG tablet Take 1 tablet (100 mg total) by mouth daily. 1 tablet 0  ? vitamin B-12 1000 MCG tablet Take 1 tablet (1,000 mcg total) by mouth daily. 30 tablet 0  ? ?No facility-administered medications prior to visit.  ? ? ?Allergies  ?Allergen Reactions  ? Shellfish Allergy   ?   "I pass out"  ? ? ?ROS ?Review of Systems  ?Constitutional:  Positive for appetite change, fatigue and unexpected weight change. Negative for chills and fever.  ?HENT:  Negative for congestion, sinus pressure, sinus pain and sore throat.   ?Eyes:  Negative for pain and discharge.  ?Respiratory:  Positive for shortness of breath. Negative for cough.   ?Cardiovascular:  Positive for palpitations. Negative for chest pain.  ?Gastrointestinal:  Negative for abdominal pain, diarrhea, nausea and  vomiting.  ?Endocrine: Negative for polydipsia and polyuria.  ?Genitourinary:  Positive for urgency. Negative for dysuria and hematuria.  ?Musculoskeletal:  Positive for gait problem and myalgias. Negative for neck pain and neck stiffness.  ?Skin:  Negative for rash.  ?Neurological:  Positive for tremors, weakness and numbness. Negative for dizziness.  ?Psychiatric/Behavioral:  Positive for agitation, behavioral problems, decreased concentration, dysphoric mood and sleep disturbance. Negative for suicidal ideas. The patient is nervous/anxious.   ? ?  ?Objective:  ?  ?Physical Exam ?Vitals reviewed.  ?Constitutional:   ?   General: She is not in acute distress. ?   Appearance: She is not diaphoretic.  ?   Comments: In wheelchair  ?HENT:  ?   Head: Normocephalic and atraumatic.  ?   Nose: Nose normal.  ?   Mouth/Throat:  ?   Mouth: Mucous membranes are moist.  ?Eyes:  ?   General: No scleral icterus. ?   Extraocular Movements: Extraocular movements intact.  ?Cardiovascular:  ?   Rate and Rhythm: Normal rate and regular rhythm.  ?   Pulses: Normal pulses.  ?   Heart sounds: Normal heart sounds. No murmur heard. ?Pulmonary:  ?   Breath sounds: Normal breath sounds. No wheezing or rales.  ?Abdominal:  ?   Palpations: Abdomen is soft.  ?   Tenderness: There is no abdominal tenderness.  ?Musculoskeletal:  ?   Cervical back: Neck supple. No tenderness.  ?   Right lower leg: No edema.  ?   Left lower leg: No edema.  ?Skin: ?   General: Skin  is warm.  ?   Findings: No rash.  ?Neurological:  ?   General: No focal deficit present.  ?   Mental Status: She is alert and oriented to person, place, and time.  ?   Motor: Weakness (3/5 in b/l UE and LE) p

## 2021-08-16 NOTE — Assessment & Plan Note (Addendum)
Has had ECT during his recent hospitalization ?Continue Zyprexa, Remeron and BuSpar - refilled for now ?Awaiting out patient psychiatry eval ?

## 2021-08-16 NOTE — Assessment & Plan Note (Signed)
Lab Results  ?Component Value Date  ? PTH 165 (H) 05/04/2021  ? CALCIUM 9.5 07/15/2021  ? PHOS 3.0 06/29/2021  ? ?Has had borderline elevated calcium in the past ?Can explain psychosis, nephrolithiasis and tachycardia ?Referred to endocrinology ?

## 2021-08-16 NOTE — Assessment & Plan Note (Signed)
Could be from venous insufficiency and protein calorie malnutrition ?Leg elevation as tolerated ?Continue PT for now ?Continue protein supplement for now ?

## 2021-08-17 ENCOUNTER — Telehealth: Payer: Self-pay | Admitting: Hematology and Oncology

## 2021-08-17 NOTE — Progress Notes (Signed)
HEMATOLOGY-ONCOLOGY TELEPHONE VISIT PROGRESS NOTE ? ?I connected with Amber [name] on 08/20/21 at  2:30 PM EDT by telephone and verified that I am speaking with the correct person using two identifiers.  ?I discussed the limitations, risks, security and privacy concerns of performing an evaluation and management service by telephone and the availability of in person appointments.  ?I also discussed with the patient that there may be a patient responsible charge related to this service. The patient expressed understanding and agreed to proceed.  ?CHIEF COMPLAINTS/PURPOSE OF CONSULTATION: Anemia ? ?History of Present Illness:  76 y.o. female is here because of recent diagnosis of Anemia. She presents to the clinic today via follow-up telephone visit.  She is generally asymptomatic from the anemia.  Her hemoglobin has improved from 8 to 9 g.  Most of the work-up was negative with exception of finding of hypothyroidism as well as a low erythropoietin level. ? ?REVIEW OF SYSTEMS:   ?Constitutional: Denies fevers, chills or abnormal weight loss ?All other systems were reviewed with the patient and are negative. ? ?Observations/Objective:  ?  ?Assessment Plan:  ?Normocytic anemia ?Lab review: 07/16/2021: Hemoglobin 8.3, MCV 93 ?  ?Hospitalization 06/19/2021-07/17/2021: Depression with refusal to eat and failure to thrive (acute kidney injury), tested positive for COVID, hematuria due to Foley insertion with severe anemia, right lower lobe pneumonia, C. difficile enterocolitis ? ?Bil blood clots with PE: Eliquis until July 2023 ?  ?Diagnosis of anemia: Anemia of chronic disease and acute illness from recent hospitalization ?Lab review: 08/10/2021: Hemoglobin 9.1, MCV 94.3, folate 9.9, reticulocytes: 1.3%, absolute 40.5, SPEP: No M protein, iron saturation 22%, haptoglobin 186, B12 2424, erythropoietin 12.1, TSH 0.08 ? ?Based on the lab results I recommended starting erythropoietin stimulating agent therapy with Aranesp every 3  weeks. ?However patient wanted to wait and see if the blood counts spontaneously improved. ? ?Plan is to recheck labs in 2 months and follow-up after that ? ? ? ?I discussed the assessment and treatment plan with the patient. The patient was provided an opportunity to ask questions and all were answered. The patient agreed with the plan and demonstrated an understanding of the instructions. The patient was advised to call back or seek an in-person evaluation if the symptoms worsen or if the condition fails to improve as anticipated.  ? ?I provided 15 minutes of non-face-to-face time during this encounter. Harriette Ohara, MD   ? ?I Gardiner Coins am scribing for Dr. Lindi Adie ? ?I have reviewed the above documentation for accuracy and completeness, and I agree with the above. ?  ?

## 2021-08-17 NOTE — Telephone Encounter (Signed)
Scheduled appointment per 3/17 los. Talked with the patients daughter and she is aware of the upcoming appointment. ?

## 2021-08-20 ENCOUNTER — Inpatient Hospital Stay (HOSPITAL_BASED_OUTPATIENT_CLINIC_OR_DEPARTMENT_OTHER): Payer: Medicare Other | Admitting: Hematology and Oncology

## 2021-08-20 ENCOUNTER — Other Ambulatory Visit: Payer: Self-pay

## 2021-08-20 DIAGNOSIS — D649 Anemia, unspecified: Secondary | ICD-10-CM | POA: Diagnosis not present

## 2021-08-20 NOTE — Assessment & Plan Note (Signed)
Lab review: 07/16/2021: Hemoglobin 8.3, MCV 93 ?? ?Hospitalization 06/19/2021-07/17/2021: Depression with refusal to eat and failure to thrive (acute kidney injury), tested positive for COVID, hematuria due to Foley insertion with severe anemia, right lower lobe pneumonia, C. difficile enterocolitis ?? ?Diagnosis of anemia: Anemia of chronic disease and acute illness from recent hospitalization ?Lab review: 08/10/2021: Hemoglobin 9.1, MCV 94.3, folate 9.9, reticulocytes: 1.3%, absolute 40.5, SPEP: No M protein, iron saturation 22%, haptoglobin 186, B12 2424, erythropoietin 12.1, TSH 0.08 ? ?Based on the lab results I recommended starting erythropoietin stimulating agent therapy with Aranesp every 3 weeks. ?Return to clinic to start treatment and I will see the patient every 9 weeks ?

## 2021-08-22 ENCOUNTER — Telehealth: Payer: Self-pay | Admitting: Hematology and Oncology

## 2021-08-22 NOTE — Telephone Encounter (Signed)
Scheduled appointment per 3/27 los. Talked with the patients daughter and she is aware of the patients appointment.  ?

## 2021-08-30 ENCOUNTER — Ambulatory Visit (INDEPENDENT_AMBULATORY_CARE_PROVIDER_SITE_OTHER): Payer: Medicare Other | Admitting: "Endocrinology

## 2021-08-30 ENCOUNTER — Encounter: Payer: Self-pay | Admitting: "Endocrinology

## 2021-08-30 ENCOUNTER — Encounter: Payer: Self-pay | Admitting: Internal Medicine

## 2021-08-30 VITALS — BP 122/76 | HR 84 | Ht 65.5 in | Wt 144.0 lb

## 2021-08-30 DIAGNOSIS — E0590 Thyrotoxicosis, unspecified without thyrotoxic crisis or storm: Secondary | ICD-10-CM

## 2021-08-30 NOTE — Progress Notes (Signed)
? ? ? 08/30/2021   ? ? ?Endocrinology Consult Note  ? ? ?Subjective:  ? ? Patient ID: Amber [name], female    DOB: [birth year], PCP Amber Spar, MD. ? ? ?Past Medical History:  ?Diagnosis Date  ? ABLA (acute blood loss anemia) 06/19/2021  ? Anxiety   ? Aspiration pneumonia of right lung due to gastric secretions ([place])   ? C. difficile colitis 07/13/2021  ? COVID-19 virus infection 06/08/2021   ? Hypertension   ? Kidney disease   ? Stroke [facility])   ? ? ?History reviewed. No pertinent surgical history. ? ?Social History  ? ?Socioeconomic History  ? Marital status: Married  ?  Spouse name: Not on file  ? Number of children: Not on file  ? Years of education: Not on file  ? Highest education level: Not on file  ?Occupational History  ? Not on file  ?Tobacco Use  ? Smoking status: Former  ?  Types: Cigarettes  ? Smokeless tobacco: Never  ?Vaping Use  ? Vaping Use: Never used  ?Substance and Sexual Activity  ? Alcohol use: Not Currently  ? Drug use: Not Currently  ? Sexual activity: Not Currently  ?Other Topics Concern  ? Not on file  ?Social History Narrative  ? Right handed   ? one story home   ? lives with family   ? Doesn't work currently  ? ?Social Determinants of Health  ? ?Financial Resource Strain: Not on file  ?Food Insecurity: Not on file  ?Transportation Needs: Not on file  ?Physical Activity: Not on file  ?Stress: Not on file  ?Social Connections: Not on file  ? ? ?Family History  ?Problem Relation Age of Onset  ? High blood pressure Maternal Grandmother   ? ? ?Outpatient Encounter Medications as of 08/30/2021  ?Medication Sig  ? docusate sodium (COLACE) 100 MG capsule Take 1 capsule by mouth 2 (two) times daily.  ? polyethylene glycol powder (GLYCOLAX/MIRALAX) 17 GM/SCOOP powder as needed.  ? acetaminophen (TYLENOL) 500 MG tablet Take 500 mg by mouth every 6 (six) hours as needed for moderate pain or mild pain.  ? ALPRAZolam (XANAX) 0.5 MG tablet Take 1 tablet (0.5 mg total) by mouth 2 (two) times daily as  needed for anxiety. (Patient taking differently: Take 0.5 mg by mouth 2 (two) times daily.)  ? apixaban (ELIQUIS) 5 MG TABS tablet Take 1 tablet (5 mg total) by mouth 2 (two) times daily.  ? artificial tears (LACRILUBE) OINT ophthalmic ointment Place into both eyes every 3 (three) hours as needed for dry eyes.  ? busPIRone (BUSPAR) 5 MG tablet Take 1 tablet (5 mg total) by mouth 2 (two) times daily.  ? ferrous sulfate 325 (65 FE) MG tablet Take 1 tablet (325 mg total) by mouth daily with breakfast.  ? metoprolol succinate (TOPROL-XL) 25 MG 24 hr tablet Take 0.5 tablets (12.5 mg total) by mouth daily.  ? mirtazapine (REMERON) 30 MG tablet Take 1 tablet (30 mg total) by mouth at bedtime.  ? Multiple Vitamin (MULTIVITAMIN WITH MINERALS) TABS tablet Take 1 tablet by mouth daily.  ? OLANZapine zydis (ZYPREXA) 5 MG disintegrating tablet Take 1 tablet (5 mg total) by mouth at bedtime.  ? ondansetron (ZOFRAN) 4 MG tablet Take 1 tablet (4 mg total) by mouth every 6 (six) hours as needed for nausea.  ? sodium bicarbonate 650 MG tablet Take 1 tablet (650 mg total) by mouth 2 (two) times daily.  ? Vitamin D, Ergocalciferol, (DRISDOL)  1.25 MG (50000 UNIT) CAPS capsule Take 1 capsule (50,000 Units total) by mouth every 7 (seven) days.  ? [DISCONTINUED] levofloxacin (LEVAQUIN) 500 MG tablet Take 500 mg by mouth daily.  ? [DISCONTINUED] tamsulosin (FLOMAX) 0.4 MG CAPS capsule Take 1 capsule (0.4 mg total) by mouth daily after supper.  ? ?No facility-administered encounter medications on file as of 08/30/2021.  ? ? ?ALLERGIES: ?Allergies  ?Allergen Reactions  ? Shellfish Allergy   ?  "I pass out"  ? ? ?VACCINATION STATUS: ? ?There is no immunization history on file for this patient. ? ? ?HPI ? ?Amber [name] is 76 y.o. female who presents today with a medical history as above. she is being seen in consultation for hyperthyroidism requested by Amber Spar, MD.  she has been dealing with symptoms of anxiety, weight loss, sleep  disturbance for several weeks. ? ?These symptoms are progressively worsening and troubling to her. ?her most recent thyroid labs revealed  suppressed TSH . She is not on antithyroid meds. ?She denies any prior thyroid dysfunction. ? ?she denies dysphagia, choking, shortness of breath, no recent voice change.  ?  ?she denies family history of thyroid dysfunction nor thyroid malignancy. ?she denies personal history of goiter. she is not on any anti-thyroid medications nor on any thyroid hormone supplements. she  is willing to proceed with appropriate work up and therapy for thyrotoxicosis. She is accompanied by her daughter. ? ? ?                        Review of systems ? ?Constitutional: + weight loss, + fatigue, + subjective hyperthermia ?Eyes: no blurry vision, +xerophthalmia ?ENT: no sore throat, no nodules palpated in throat, no dysphagia/odynophagia, nor hoarseness ?Cardiovascular: no Chest Pain, no Shortness of Breath, - palpitations ( patient on metoprolol) , no leg swelling ?Respiratory: no cough, no SOB ?Gastrointestinal: no Nausea, no Vomiting, no Diarhhea ?Musculoskeletal: no muscle/joint aches ?Skin: no rashes ?Neurological: + tremors, no numbness, no tingling, no dizziness ?Psychiatric: no depression, +anxiety ? ? ?Objective:  ?  ?BP 122/76   Pulse 84   Ht 5' 5.5" (1.664 m)   Wt 144 lb (65.3 kg)   BMI 23.60 kg/m?   ?Wt Readings from Last 3 Encounters:  ?08/30/21 144 lb (65.3 kg)  ?08/16/21 136 lb 3.2 oz (61.8 kg)  ?07/12/21 141 lb 5 oz (64.1 kg)  ?  ?                     ?                       Physical exam ? ?Constitutional: Body mass index is 23.6 kg/m?., not in acute distress, + anxious state of mind ?Eyes: PERRLA, EOMI,  exophthalmos ?ENT: moist mucous membranes, - thyromegaly, no cervical lymphadenopathy ?Cardiovascular: + norma precordial activity, - tachycardic,  no Murmur/Rubs/Gallops ?Respiratory:  adequate breathing efforts, no gross chest deformity, Clear to auscultation  bilaterally ?Gastrointestinal: abdomen soft, Non -tender, No distension, Bowel Sounds present ?Musculoskeletal: no gross deformities, strength intact in all four extremities, + dependent bilateral LE edema. ?Skin: moist, warm, no rashes ?Neurological: +tremor with outstretched hands,  ++Deep Tendon Reflexes  on both lower extremities. ? ? ?CMP  ?   ?Component Value Date/Time  ? NA 142 07/15/2021 0437  ? NA 143 03/29/2021 1140  ? K 3.5 07/15/2021 0437  ? CL 114 (H) 07/15/2021 0437  ? CO2 23  07/15/2021 0437  ? GLUCOSE 92 07/15/2021 0437  ? BUN 35 (H) 07/15/2021 0437  ? BUN 64 (H) 03/29/2021 1140  ? CREATININE 1.52 (H) 07/15/2021 0437  ? CALCIUM 9.5 07/15/2021 0437  ? PROT 4.4 (L) 06/27/2021 0456  ? PROT 6.5 05/04/2021 1531  ? ALBUMIN 1.7 (L) 06/27/2021 0456  ? ALBUMIN 3.4 (L) 05/04/2021 1531  ? AST 13 (L) 06/27/2021 0456  ? ALT 13 06/27/2021 0456  ? ALKPHOS 50 06/27/2021 0456  ? BILITOT 0.5 06/27/2021 0456  ? BILITOT 1.2 05/04/2021 1531  ? GFRNONAA 36 (L) 07/15/2021 0437  ? ? ? ?CBC ?   ?Component Value Date/Time  ? WBC 5.1 08/10/2021 1426  ? WBC 5.9 07/15/2021 0437  ? RBC 3.09 (L) 08/10/2021 1426  ? RBC 3.15 (L) 08/10/2021 1426  ? HGB 9.1 (L) 08/10/2021 1426  ? HCT 29.7 (L) 08/10/2021 1426  ? PLT 256 08/10/2021 1426  ? MCV 94.3 08/10/2021 1426  ? MCH 28.9 08/10/2021 1426  ? MCHC 30.6 08/10/2021 1426  ? RDW 13.9 08/10/2021 1426  ? LYMPHSABS 2.2 08/10/2021 1426  ? MONOABS 0.3 08/10/2021 1426  ? EOSABS 0.1 08/10/2021 1426  ? BASOSABS 0.0 08/10/2021 1426  ? ? ? ?Lab Results  ?Component Value Date  ? TSH <0.080 (L) 08/10/2021  ? TSH 0.545 06/09/2021  ? TSH 0.559 06/08/2021  ? TSH 1.560 05/04/2021  ? TSH 0.152 (L) 03/29/2021  ?  ? ? ?Assessment & Plan:  ? ?1. Hyperthyroidism ? ? ?she is being seen at a kind request of Amber Spar, MD. ?her history and most recent labs are reviewed, and she was examined clinically. Subjective and objective findings are consistent with thyrotoxicosis likely from primary hyperthyroidism.  The potential risks of untreated thyrotoxicosis and the need for definitive therapy have been discussed in detail with her, and she agrees to proceed with diagnostic workup and treatment plan. ?  ?I like to repeat full profil

## 2021-08-31 LAB — THYROID PEROXIDASE ANTIBODY: Thyroperoxidase Ab SerPl-aCnc: <9 IU/mL (ref 0–34)

## 2021-08-31 LAB — T4, FREE: Free T4: 1.43 ng/dL (ref 0.82–1.77)

## 2021-08-31 LAB — THYROGLOBULIN ANTIBODY: Thyroglobulin Antibody: <1 IU/mL (ref 0.0–0.9)

## 2021-08-31 LAB — TSH: TSH: 0.022 u[IU]/mL — ABNORMAL LOW (ref 0.450–4.500)

## 2021-08-31 LAB — T3, FREE: T3, Free: 3.4 pg/mL (ref 2.0–4.4)

## 2021-09-04 ENCOUNTER — Ambulatory Visit (HOSPITAL_COMMUNITY): Payer: Medicare Other | Admitting: Psychiatry

## 2021-09-10 ENCOUNTER — Encounter (HOSPITAL_COMMUNITY)
Admission: RE | Admit: 2021-09-10 | Discharge: 2021-09-10 | Disposition: A | Discharge status: Home / Self Care | Payer: Medicare Other | Source: Ambulatory Visit | Attending: "Endocrinology | Admitting: "Endocrinology

## 2021-09-10 DIAGNOSIS — E0590 Thyrotoxicosis, unspecified without thyrotoxic crisis or storm: Secondary | ICD-10-CM | POA: Diagnosis present

## 2021-09-10 MED ORDER — SODIUM IODIDE I-123 7.4 MBQ CAPS
456.0000 | ORAL_CAPSULE | Freq: Once | ORAL | Status: AC
  Administered 2021-09-10: 456 via ORAL

## 2021-09-11 ENCOUNTER — Encounter (HOSPITAL_COMMUNITY)
Admission: RE | Admit: 2021-09-11 | Discharge: 2021-09-11 | Disposition: A | Discharge status: Home / Self Care | Payer: Medicare Other | Source: Ambulatory Visit | Attending: "Endocrinology | Admitting: "Endocrinology

## 2021-09-11 ENCOUNTER — Telehealth: Payer: Self-pay

## 2021-09-11 NOTE — Telephone Encounter (Signed)
Medicine form patient assistance foundation ? ?Call Chamita when ready [phone]. ? ?Copied ?Noted ?sleeved ? ?

## 2021-09-13 DIAGNOSIS — Z0279 Encounter for issue of other medical certificate: Secondary | ICD-10-CM

## 2021-09-13 NOTE — Telephone Encounter (Signed)
Called patient daughter will pick up forms ?

## 2021-09-14 ENCOUNTER — Other Ambulatory Visit: Payer: Self-pay | Admitting: Internal Medicine

## 2021-09-14 DIAGNOSIS — I2699 Other pulmonary embolism without acute cor pulmonale: Secondary | ICD-10-CM

## 2021-09-14 DIAGNOSIS — R071 Chest pain on breathing: Secondary | ICD-10-CM

## 2021-09-18 ENCOUNTER — Ambulatory Visit (INDEPENDENT_AMBULATORY_CARE_PROVIDER_SITE_OTHER): Payer: Medicare Other | Admitting: "Endocrinology

## 2021-09-18 ENCOUNTER — Encounter: Payer: Self-pay | Admitting: "Endocrinology

## 2021-09-18 VITALS — BP 136/92 | HR 88 | Ht 65.0 in

## 2021-09-18 DIAGNOSIS — E0590 Thyrotoxicosis, unspecified without thyrotoxic crisis or storm: Secondary | ICD-10-CM | POA: Diagnosis not present

## 2021-09-18 DIAGNOSIS — E041 Nontoxic single thyroid nodule: Secondary | ICD-10-CM | POA: Diagnosis not present

## 2021-09-18 NOTE — Progress Notes (Signed)
? ? ? 09/18/2021   ? ?Endocrinology follow-up note ? ? ?Subjective:  ? ? Patient ID: Amber [name], female    DOB: 06/05/1945, PCP Lindell Spar, MD. ? ? ?Past Medical History:  ?Diagnosis Date  ? ABLA (acute blood loss anemia) 06/19/2021  ? Anxiety   ? Aspiration pneumonia of right lung due to gastric secretions ([place])   ? C. difficile colitis 07/13/2021  ? COVID-19 virus infection 06/08/2021   ? Hypertension   ? Kidney disease   ? Stroke [facility])   ? ? ?History reviewed. No pertinent surgical history. ? ?Social History  ? ?Socioeconomic History  ? Marital status: Married  ?  Spouse name: Not on file  ? Number of children: Not on file  ? Years of education: Not on file  ? Highest education level: Not on file  ?Occupational History  ? Not on file  ?Tobacco Use  ? Smoking status: Former  ?  Types: Cigarettes  ? Smokeless tobacco: Never  ?Vaping Use  ? Vaping Use: Never used  ?Substance and Sexual Activity  ? Alcohol use: Not Currently  ? Drug use: Not Currently  ? Sexual activity: Not Currently  ?Other Topics Concern  ? Not on file  ?Social History Narrative  ? Right handed   ? one story home   ? lives with family   ? Doesn't work currently  ? ?Social Determinants of Health  ? ?Financial Resource Strain: Not on file  ?Food Insecurity: Not on file  ?Transportation Needs: Not on file  ?Physical Activity: Not on file  ?Stress: Not on file  ?Social Connections: Not on file  ? ? ?Family History  ?Problem Relation Age of Onset  ? High blood pressure Maternal Grandmother   ? ? ?Outpatient Encounter Medications as of 09/18/2021  ?Medication Sig  ? acetaminophen (TYLENOL) 500 MG tablet Take 500 mg by mouth every 6 (six) hours as needed for moderate pain or mild pain.  ? ALPRAZolam (XANAX) 0.5 MG tablet Take 1 tablet (0.5 mg total) by mouth 2 (two) times daily as needed for anxiety. (Patient taking differently: Take 0.5 mg by mouth 2 (two) times daily.)  ? apixaban (ELIQUIS) 5 MG TABS tablet Take 1 tablet (5 mg total) by mouth  2 (two) times daily.  ? artificial tears (LACRILUBE) OINT ophthalmic ointment Place into both eyes every 3 (three) hours as needed for dry eyes.  ? busPIRone (BUSPAR) 5 MG tablet Take 1 tablet (5 mg total) by mouth 2 (two) times daily.  ? docusate sodium (COLACE) 100 MG capsule Take 1 capsule by mouth 2 (two) times daily.  ? ferrous sulfate 325 (65 FE) MG tablet Take 1 tablet (325 mg total) by mouth daily with breakfast.  ? metoprolol succinate (TOPROL-XL) 25 MG 24 hr tablet Take 0.5 tablets (12.5 mg total) by mouth daily.  ? mirtazapine (REMERON) 30 MG tablet Take 1 tablet (30 mg total) by mouth at bedtime.  ? Multiple Vitamin (MULTIVITAMIN WITH MINERALS) TABS tablet Take 1 tablet by mouth daily.  ? OLANZapine zydis (ZYPREXA) 5 MG disintegrating tablet Take 1 tablet (5 mg total) by mouth at bedtime.  ? ondansetron (ZOFRAN) 4 MG tablet Take 1 tablet (4 mg total) by mouth every 6 (six) hours as needed for nausea.  ? polyethylene glycol powder (GLYCOLAX/MIRALAX) 17 GM/SCOOP powder as needed.  ? sodium bicarbonate 650 MG tablet Take 1 tablet (650 mg total) by mouth 2 (two) times daily.  ? Vitamin D, Ergocalciferol, (DRISDOL) 1.25 MG (  50000 UNIT) CAPS capsule Take 1 capsule (50,000 Units total) by mouth every 7 (seven) days.  ? ?No facility-administered encounter medications on file as of 09/18/2021.  ? ? ?ALLERGIES: ?Allergies  ?Allergen Reactions  ? Shellfish Allergy   ?  "I pass out"  ? ? ?VACCINATION STATUS: ? ?There is no immunization history on file for this patient. ? ? ?HPI ? ?Amber [name] is 76 y.o. female who presents today with a medical history as above. she is being seen in consultation for hyperthyroidism requested by Lindell Spar, MD.  she has been dealing with symptoms of anxiety, weight loss, sleep disturbance for several weeks. ? ?These symptoms are progressively worsening and troubling to her. ?her most recent thyroid labs revealed  suppressed TSH . She is not on antithyroid meds. ?She denies any  prior thyroid dysfunction. ? ?she denies dysphagia, choking, shortness of breath, no recent voice change.  ?  ?she denies family history of thyroid dysfunction nor thyroid malignancy. ?she denies personal history of goiter. she is not on any anti-thyroid medications nor on any thyroid hormone supplements. she  is willing to proceed with appropriate work up and therapy for thyrotoxicosis. She is accompanied by her daughter. ? ? ?                        Review of systems ? ?Constitutional: + weight loss, + fatigue, + subjective hyperthermia ?Eyes: no blurry vision, +xerophthalmia ?ENT: no sore throat, no nodules palpated in throat, no dysphagia/odynophagia, nor hoarseness ?Cardiovascular: no Chest Pain, no Shortness of Breath, - palpitations ( patient on metoprolol) , no leg swelling ?Respiratory: no cough, no SOB ?Gastrointestinal: no Nausea, no Vomiting, no Diarhhea ?Musculoskeletal: no muscle/joint aches ?Skin: no rashes ?Neurological: + tremors, no numbness, no tingling, no dizziness ?Psychiatric: no depression, +anxiety ? ? ?Objective:  ?  ?BP (!) 136/92   Pulse 88   Ht '5\' 5"'$  (1.651 m)   BMI 23.96 kg/m?   ?Wt Readings from Last 3 Encounters:  ?08/30/21 144 lb (65.3 kg)  ?08/16/21 136 lb 3.2 oz (61.8 kg)  ?07/12/21 141 lb 5 oz (64.1 kg)  ?  ?                     ?                       Physical exam ? ?Constitutional: Body mass index is 23.96 kg/m?., not in acute distress, + anxious state of mind ?Eyes: PERRLA, EOMI,  exophthalmos ?ENT: moist mucous membranes, - thyromegaly, no cervical lymphadenopathy ?Cardiovascular: + norma precordial activity, - tachycardic,  no Murmur/Rubs/Gallops ?Respiratory:  adequate breathing efforts, no gross chest deformity, Clear to auscultation bilaterally ?Gastrointestinal: abdomen soft, Non -tender, No distension, Bowel Sounds present ?Musculoskeletal: no gross deformities, strength intact in all four extremities, + dependent bilateral LE edema. ?Skin: moist, warm, no  rashes ?Neurological: +tremor with outstretched hands,  ++Deep Tendon Reflexes  on both lower extremities. ? ? ?CMP  ?   ?Component Value Date/Time  ? NA 142 07/15/2021 0437  ? NA 143 03/29/2021 1140  ? K 3.5 07/15/2021 0437  ? CL 114 (H) 07/15/2021 0437  ? CO2 23 07/15/2021 0437  ? GLUCOSE 92 07/15/2021 0437  ? BUN 35 (H) 07/15/2021 0437  ? BUN 64 (H) 03/29/2021 1140  ? CREATININE 1.52 (H) 07/15/2021 0437  ? CALCIUM 9.5 07/15/2021 0437  ? PROT 4.4 (L) 06/27/2021 0456  ?  PROT 6.5 05/04/2021 1531  ? ALBUMIN 1.7 (L) 06/27/2021 0456  ? ALBUMIN 3.4 (L) 05/04/2021 1531  ? AST 13 (L) 06/27/2021 0456  ? ALT 13 06/27/2021 0456  ? ALKPHOS 50 06/27/2021 0456  ? BILITOT 0.5 06/27/2021 0456  ? BILITOT 1.2 05/04/2021 1531  ? GFRNONAA 36 (L) 07/15/2021 0437  ? ? ? ?CBC ?   ?Component Value Date/Time  ? WBC 5.1 08/10/2021 1426  ? WBC 5.9 07/15/2021 0437  ? RBC 3.09 (L) 08/10/2021 1426  ? RBC 3.15 (L) 08/10/2021 1426  ? HGB 9.1 (L) 08/10/2021 1426  ? HCT 29.7 (L) 08/10/2021 1426  ? PLT 256 08/10/2021 1426  ? MCV 94.3 08/10/2021 1426  ? MCH 28.9 08/10/2021 1426  ? MCHC 30.6 08/10/2021 1426  ? RDW 13.9 08/10/2021 1426  ? LYMPHSABS 2.2 08/10/2021 1426  ? MONOABS 0.3 08/10/2021 1426  ? EOSABS 0.1 08/10/2021 1426  ? BASOSABS 0.0 08/10/2021 1426  ? ? ? ?Lab Results  ?Component Value Date  ? TSH 0.022 (L) 08/30/2021  ? TSH <0.080 (L) 08/10/2021  ? TSH 0.545 06/09/2021  ? TSH 0.559 06/08/2021  ? TSH 1.560 05/04/2021  ? TSH 0.152 (L) 03/29/2021  ? FREET4 1.43 08/30/2021  ?  ? ?Thyroid uptake and scan on 09/11/21 ?4 hour I-123 uptake = 2.9% (normal 5-20%),  ?24 hour I-123 uptake = 11.9% (normal 10-30%) ?  ?IMPRESSION: ?1. Decreased iodine uptake at 4 hours, with low normal uptake at 24 ?hours. ?2. Diffuse heterogeneity of the thyroid parenchyma, with equivocal ?findings for cold nodule corresponding to the mid left lobe thyroid ?lesion seen on prior CT. Correlation with ultrasound is recommended ?to help guide possible sampling. ? ?Assessment  & Plan:  ? ?1. Hyperthyroidism ?2. Cold thyroid nodule  ? ?I reviewed her labs and thyroid scan with her and her daughter. ?Her scan shows a possible cold nodule corresponding to the nodule lesion seen on CT. ?He

## 2021-09-25 ENCOUNTER — Encounter: Payer: Self-pay | Admitting: Psychiatry

## 2021-09-25 ENCOUNTER — Ambulatory Visit (INDEPENDENT_AMBULATORY_CARE_PROVIDER_SITE_OTHER): Payer: Medicare Other | Admitting: Psychiatry

## 2021-09-25 ENCOUNTER — Telehealth (HOSPITAL_COMMUNITY): Payer: Self-pay | Admitting: Psychiatry

## 2021-09-25 VITALS — BP 162/96 | HR 77 | Temp 98.3°F

## 2021-09-25 DIAGNOSIS — R946 Abnormal results of thyroid function studies: Secondary | ICD-10-CM | POA: Diagnosis not present

## 2021-09-25 DIAGNOSIS — F411 Generalized anxiety disorder: Secondary | ICD-10-CM | POA: Diagnosis not present

## 2021-09-25 DIAGNOSIS — F333 Major depressive disorder, recurrent, severe with psychotic symptoms: Secondary | ICD-10-CM

## 2021-09-25 DIAGNOSIS — Z79899 Other long term (current) drug therapy: Secondary | ICD-10-CM | POA: Diagnosis not present

## 2021-09-25 MED ORDER — MIRTAZAPINE 30 MG PO TABS: 30.0000 mg | ORAL_TABLET | Freq: Every day | ORAL | 1 refills | Status: DC

## 2021-09-25 MED ORDER — OLANZAPINE 5 MG PO TBDP: 5.0000 mg | ORAL_TABLET | Freq: Every day | ORAL | 1 refills | Status: DC

## 2021-09-25 MED ORDER — ALPRAZOLAM 0.5 MG PO TABS: 0.5000 mg | ORAL_TABLET | Freq: Two times a day (BID) | ORAL | 1 refills | Status: DC | PRN

## 2021-09-25 MED ORDER — BUSPIRONE HCL 10 MG PO TABS: 10.0000 mg | ORAL_TABLET | Freq: Two times a day (BID) | ORAL | 1 refills | Status: DC

## 2021-09-25 NOTE — Telephone Encounter (Signed)
D:  Dr. Shea Evans referred pt to Amber [name].  A:  Placed call to orient pt.  Daughter answered the phone and put the case manager on speakerphone with pt.  Daughter states will discuss with pt and call the case manager back.  Inform Dr. Shea Evans. ?

## 2021-09-25 NOTE — Progress Notes (Signed)
error 

## 2021-09-25 NOTE — Patient Instructions (Signed)
[email or web address] ? ?[email or web address] ? ?[email or web address] ?

## 2021-09-25 NOTE — Progress Notes (Signed)
[facility] MD OP Progress Note ? ?09/25/2021 2:32 PM ?Amber [name]  ?MRN:  [id number] ? ?Chief Complaint:  ?Chief Complaint  ?Patient presents with  ? Follow-up: Patient with multiple medical problems including hypothyroidism, major depression, anxiety disorder completed ECT treatment-presented for medication management.  ? ?HPI: Amber [name] is a 76 year old Caucasian female, married, lives in [place], has a history of MDD, generalized anxiety disorder, hypothyroidism, secondary hyperparathyroidism, chronic kidney disease, presented for medication management. ? ?This is patient's first visit at this practice.  Patient however was under the care of Dr. Weber Cooks and completed ECT treatments in February. ? ?Today patient appeared to be alert, oriented to person place time situation.  Patient was able to answer questions appropriately.  Patient however is a limited historian and hence her daughter who was present with her-Ms. [name] Cross provided collateral information. ? ?As per daughter ,patient decompensated over the course of several months prior to her inpatient admission at [facility] in January 2023.  Patient started withdrawing to herself after the COVID-19 pandemic started.  Initially she stopped going outside.  Thereafter she was withdrawn to her room.  She gradually stopped eating and reached a point where she could not even get out of her bed and needed diapers.  Patient hence was admitted for mental status changes and failure to thrive.  I have reviewed notes per Dr. Darleene Cleaver dated 06/09/2021-patient had failure to thrive, suicidal ideation, hallucination as well as paranoia.  Patient was referred for ECT treatments and was also evaluated by Dr. Weber Cooks.  Patient completed 6 ECT treatments. ? ?As per daughter patient is currently taking mirtazapine 30 mg, Zyprexa 5 mg at bedtime, BuSpar 5 mg twice a day and Xanax 0.5 mg as needed twice a day.  Patient overall has made improvement with regards to her depression.   Has started eating better and her mood seems to be better although she continues to need a lot of support and encouragement to do activities. she has no memory of what happened prior and during her hospitalization over the course of several weeks.  Patient however currently does not seem to have any significant memory issues.  Patient however continues to have difficulty with staying active, doing certain activities, walking and so on.  She needs a lot of support and encouragement to do it.  She seems to be anxious all the time.  Patient is anxious about what medications to take and repetitively calls her daughter regarding the same even when it is not time to take her medications.  Patient also anxious about falls and hence has trouble walking.  She was provided physical therapy.  She also had occupational therapy.  OT released her.  She continues to be under the care of physical therapy.  She does walk with walker however is constantly afraid. ? ?Patient currently denies any suicidality, homicidality or perceptual disturbances. ? ?Completed SLUMS in session today-scored 27 out of 30. ? ? ? ? ? ? ?Visit Diagnosis:  ?  ICD-10-CM   ?1. MDD (major depressive disorder), recurrent, severe, with psychosis ([place])  F33.3 busPIRone (BUSPAR) 10 MG tablet  ?  ?2. GAD (generalized anxiety disorder)  F41.1 busPIRone (BUSPAR) 10 MG tablet  ?  ALPRAZolam (XANAX) 0.5 MG tablet  ?  ?3. High risk medication use  Z79.899 Lipid panel  ?  Hemoglobin A1C  ?  Prolactin  ?  ?4. Thyroid function test abnormal  R94.6 mirtazapine (REMERON) 30 MG tablet  ?  OLANZapine zydis (ZYPREXA) 5  MG disintegrating tablet  ?  ? ? ?Past Psychiatric History: Patient was admitted to internal medicine service at [facility] for failure to thrive in January 2023 and later on was transferred to [facility] for ECT treatments ( feb 2023).  Patient denies any previous inpatient mental health admissions.  Denies suicide attempts.  May have spoken to a therapist  several years ago however does not remember the details. ? ?Past Medical History:  ?Past Medical History:  ?Diagnosis Date  ? ABLA (acute blood loss anemia) 06/19/2021  ? Anxiety   ? Aspiration pneumonia of right lung due to gastric secretions ([place])   ? C. difficile colitis 07/13/2021  ? COVID-19 virus infection 06/08/2021   ? Hypertension   ? Kidney disease   ? Stroke [facility])   ? History reviewed. No pertinent surgical history. ? ?Family Psychiatric History: As noted below. ? ?Family History:  ?Family History  ?Problem Relation Age of Onset  ? High blood pressure Maternal Grandmother   ? ? ?Social History: Patient was born in [place].  She was raised by her grandparents.  Did not know her dad much.  Had a toxic relationship with her mother.  Patient graduated high school and did Hotel manager course.  Patient owns her own business, Pharmac[employer].  She is married.  She has 5 children, 2 daughters and 3 sons.  She has 6 grandchildren.  Patient has a good relationship with her husband.  Currently lives with her husband in [place].  Has a good relationship with her children.  Denies any history of trauma. ?Social History  ? ?Socioeconomic History  ? Marital status: Married  ?  Spouse name: Not on file  ? Number of children: 5  ? Years of education: Not on file  ? Highest education level: Not on file  ?Occupational History  ? Not on file  ?Tobacco Use  ? Smoking status: Former  ?  Types: Cigarettes  ? Smokeless tobacco: Never  ?Vaping Use  ? Vaping Use: Never used  ?Substance and Sexual Activity  ? Alcohol use: Not Currently  ? Drug use: Not Currently  ? Sexual activity: Not Currently  ?Other Topics Concern  ? Not on file  ?Social History Narrative  ? Right handed   ? one story home   ? lives with family   ? Doesn't work currently  ? ?Social Determinants of Health  ? ?Financial Resource Strain: Not on file  ?Food Insecurity: Not on file  ?Transportation Needs: Not on file  ?Physical Activity: Not on file  ?Stress: Not on  file  ?Social Connections: Not on file  ? ? ?Allergies:  ?Allergies  ?Allergen Reactions  ? Shellfish Allergy   ?  "I pass out"  ? Valium [Diazepam] Other (See Comments)  ?  Makes her want to " fly"  ? ? ?Metabolic Disorder Labs: ?No results found for: HGBA1C, MPG ?No results found for: PROLACTIN ?No results found for: CHOL, TRIG, HDL, CHOLHDL, VLDL, LDLCALC ?Lab Results  ?Component Value Date  ? TSH 0.022 (L) 08/30/2021  ? TSH <0.080 (L) 08/10/2021  ? ? ?Therapeutic Level Labs: ?No results found for: LITHIUM ?No results found for: VALPROATE ?No components found for:  CBMZ ? ?Current Medications: ?Current Outpatient Medications  ?Medication Sig Dispense Refill  ? acetaminophen (TYLENOL) 500 MG tablet Take 500 mg by mouth every 6 (six) hours as needed for moderate pain or mild pain.    ? apixaban (ELIQUIS) 5 MG TABS tablet Take 1 tablet (5 mg  total) by mouth 2 (two) times daily. 60 tablet 3  ? busPIRone (BUSPAR) 10 MG tablet Take 1 tablet (10 mg total) by mouth 2 (two) times daily. 60 tablet 1  ? docusate sodium (COLACE) 100 MG capsule Take 1 capsule by mouth 2 (two) times daily.    ? ferrous sulfate 325 (65 FE) MG tablet Take 1 tablet (325 mg total) by mouth daily with breakfast. 30 tablet 0  ? metoprolol succinate (TOPROL-XL) 25 MG 24 hr tablet Take 0.5 tablets (12.5 mg total) by mouth daily. 45 tablet 0  ? Multiple Vitamin (MULTIVITAMIN WITH MINERALS) TABS tablet Take 1 tablet by mouth daily. 30 tablet 0  ? ondansetron (ZOFRAN) 4 MG tablet Take 1 tablet (4 mg total) by mouth every 6 (six) hours as needed for nausea. 20 tablet 0  ? polyethylene glycol powder (GLYCOLAX/MIRALAX) 17 GM/SCOOP powder as needed.    ? sodium bicarbonate 650 MG tablet Take 1 tablet (650 mg total) by mouth 2 (two) times daily. 60 tablet 2  ? Vitamin D, Ergocalciferol, (DRISDOL) 1.25 MG (50000 UNIT) CAPS capsule Take 1 capsule (50,000 Units total) by mouth every 7 (seven) days. 5 capsule 0  ? ALPRAZolam (XANAX) 0.5 MG tablet Take 1 tablet  (0.5 mg total) by mouth 2 (two) times daily as needed for anxiety. 60 tablet 1  ? mirtazapine (REMERON) 30 MG tablet Take 1 tablet (30 mg total) by mouth at bedtime. 90 tablet 1  ? OLANZapine zydis (ZYPR

## 2021-09-27 ENCOUNTER — Ambulatory Visit (HOSPITAL_COMMUNITY)
Admission: RE | Admit: 2021-09-27 | Discharge: 2021-09-27 | Disposition: A | Discharge status: Home / Self Care | Payer: Medicare Other | Source: Ambulatory Visit | Attending: "Endocrinology | Admitting: "Endocrinology

## 2021-09-27 DIAGNOSIS — E041 Nontoxic single thyroid nodule: Secondary | ICD-10-CM | POA: Diagnosis not present

## 2021-10-03 ENCOUNTER — Ambulatory Visit (INDEPENDENT_AMBULATORY_CARE_PROVIDER_SITE_OTHER): Payer: Medicare Other | Admitting: "Endocrinology

## 2021-10-03 ENCOUNTER — Encounter: Payer: Self-pay | Admitting: "Endocrinology

## 2021-10-03 VITALS — BP 134/88 | HR 92 | Ht 65.0 in | Wt 135.0 lb

## 2021-10-03 DIAGNOSIS — E0590 Thyrotoxicosis, unspecified without thyrotoxic crisis or storm: Secondary | ICD-10-CM | POA: Diagnosis not present

## 2021-10-03 DIAGNOSIS — E042 Nontoxic multinodular goiter: Secondary | ICD-10-CM | POA: Diagnosis not present

## 2021-10-03 NOTE — Progress Notes (Signed)
? ? ? 10/03/2021   ? ?Endocrinology follow-up note ? ? ?Subjective:  ? ? Patient ID: Amber [name], female    DOB: 12/04/1945, PCP Lindell Spar, MD. ? ? ?Past Medical History:  ?Diagnosis Date  ? ABLA (acute blood loss anemia) 06/19/2021  ? Anxiety   ? Aspiration pneumonia of right lung due to gastric secretions ([place])   ? C. difficile colitis 07/13/2021  ? COVID-19 virus infection 06/08/2021   ? Hypertension   ? Kidney disease   ? Stroke [facility])   ? ? ?History reviewed. No pertinent surgical history. ? ?Social History  ? ?Socioeconomic History  ? Marital status: Married  ?  Spouse name: Not on file  ? Number of children: 5  ? Years of education: Not on file  ? Highest education level: Not on file  ?Occupational History  ? Not on file  ?Tobacco Use  ? Smoking status: Former  ?  Types: Cigarettes  ? Smokeless tobacco: Never  ?Vaping Use  ? Vaping Use: Never used  ?Substance and Sexual Activity  ? Alcohol use: Not Currently  ? Drug use: Not Currently  ? Sexual activity: Not Currently  ?Other Topics Concern  ? Not on file  ?Social History Narrative  ? Right handed   ? one story home   ? lives with family   ? Doesn't work currently  ? ?Social Determinants of Health  ? ?Financial Resource Strain: Not on file  ?Food Insecurity: Not on file  ?Transportation Needs: Not on file  ?Physical Activity: Not on file  ?Stress: Not on file  ?Social Connections: Not on file  ? ? ?Family History  ?Problem Relation Age of Onset  ? High blood pressure Maternal Grandmother   ? ? ?Outpatient Encounter Medications as of 10/03/2021  ?Medication Sig  ? acetaminophen (TYLENOL) 500 MG tablet Take 500 mg by mouth every 6 (six) hours as needed for moderate pain or mild pain.  ? ALPRAZolam (XANAX) 0.5 MG tablet Take 1 tablet (0.5 mg total) by mouth 2 (two) times daily as needed for anxiety. (Patient taking differently: Take 0.25 mg by mouth 2 (two) times daily as needed for anxiety.)  ? apixaban (ELIQUIS) 5 MG TABS tablet Take 1 tablet (5 mg  total) by mouth 2 (two) times daily.  ? busPIRone (BUSPAR) 10 MG tablet Take 1 tablet (10 mg total) by mouth 2 (two) times daily.  ? docusate sodium (COLACE) 100 MG capsule Take 1 capsule by mouth 2 (two) times daily.  ? ferrous sulfate 325 (65 FE) MG tablet Take 1 tablet (325 mg total) by mouth daily with breakfast.  ? metoprolol succinate (TOPROL-XL) 25 MG 24 hr tablet Take 0.5 tablets (12.5 mg total) by mouth daily.  ? mirtazapine (REMERON) 30 MG tablet Take 1 tablet (30 mg total) by mouth at bedtime.  ? Multiple Vitamin (MULTIVITAMIN WITH MINERALS) TABS tablet Take 1 tablet by mouth daily.  ? OLANZapine zydis (ZYPREXA) 5 MG disintegrating tablet Take 1 tablet (5 mg total) by mouth at bedtime.  ? ondansetron (ZOFRAN) 4 MG tablet Take 1 tablet (4 mg total) by mouth every 6 (six) hours as needed for nausea.  ? polyethylene glycol powder (GLYCOLAX/MIRALAX) 17 GM/SCOOP powder as needed.  ? sodium bicarbonate 650 MG tablet Take 1 tablet (650 mg total) by mouth 2 (two) times daily.  ? Vitamin D, Ergocalciferol, (DRISDOL) 1.25 MG (50000 UNIT) CAPS capsule Take 1 capsule (50,000 Units total) by mouth every 7 (seven) days.  ? ?No  facility-administered encounter medications on file as of 10/03/2021.  ? ? ?ALLERGIES: ?Allergies  ?Allergen Reactions  ? Shellfish Allergy   ?  "I pass out"  ? Valium [Diazepam] Other (See Comments)  ?  Makes her want to " fly"  ? ? ?VACCINATION STATUS: ? ?There is no immunization history on file for this patient. ? ? ?HPI ? ?Amber [name] is 76 y.o. female who presents today with a medical history as above. she is being seen in follow-up after she was seen in consultation for hyperthyroidism requested by Lindell Spar, MD.   ?Her work-up is in progress.  After thyroid uptake and scan showed cold nodule in the left lobe of her thyroid, she was considered for dedicated thyroid ultrasound which confirms 2.8 cm suspicious nodule corresponding to the area of cold nodule on the uptake and  scan. ? ?she has been dealing with symptoms of anxiety, weight loss, sleep disturbance for several weeks. ? ?These symptoms are progressively worsening and troubling to her. ?her most recent thyroid labs revealed  suppressed TSH . She is not on antithyroid meds. ?She denies any prior thyroid dysfunction. ? ?she denies dysphagia, choking, shortness of breath, no recent voice change.  ?  ?she denies family history of thyroid dysfunction nor thyroid malignancy. ?she denies personal history of goiter. she is not on any anti-thyroid medications nor on any thyroid hormone supplements. she  is willing to proceed with appropriate work up and therapy for thyrotoxicosis. She is accompanied by her daughter. ? ? ?                        Review of systems ? ?Constitutional: + weight loss, + fatigue, + subjective hyperthermia ?Eyes: no blurry vision, +xerophthalmia ?ENT: no sore throat, no nodules palpated in throat, no dysphagia/odynophagia, nor hoarseness ?Cardiovascular: no Chest Pain, no Shortness of Breath, - palpitations ( patient on metoprolol) , no leg swelling ?Respiratory: no cough, no SOB ?Gastrointestinal: no Nausea, no Vomiting, no Diarhhea ?Musculoskeletal: no muscle/joint aches ?Skin: no rashes ?Neurological: + tremors, no numbness, no tingling, no dizziness ?Psychiatric: no depression, +anxiety ? ? ?Objective:  ?  ?BP 134/88   Pulse 92   Ht '5\' 5"'$  (1.651 m)   Wt 135 lb (61.2 kg)   BMI 22.47 kg/m?   ?Wt Readings from Last 3 Encounters:  ?10/03/21 135 lb (61.2 kg)  ?08/30/21 144 lb (65.3 kg)  ?08/16/21 136 lb 3.2 oz (61.8 kg)  ?  ?                     ?                       Physical exam ? ?Constitutional: Body mass index is 22.47 kg/m?., not in acute distress, + anxious state of mind ?Eyes: PERRLA, EOMI,  exophthalmos ?ENT: moist mucous membranes, - thyromegaly, no cervical lymphadenopathy ?Cardiovascular: + norma precordial activity, - tachycardic,  no Murmur/Rubs/Gallops ?Respiratory:  adequate breathing  efforts, no gross chest deformity, Clear to auscultation bilaterally ?Gastrointestinal: abdomen soft, Non -tender, No distension, Bowel Sounds present ?Musculoskeletal: no gross deformities, strength intact in all four extremities, + dependent bilateral LE edema. ?Skin: moist, warm, no rashes ?Neurological: +tremor with outstretched hands,  ++Deep Tendon Reflexes  on both lower extremities. ? ? ?CMP  ?   ?Component Value Date/Time  ? NA 142 07/15/2021 0437  ? NA 143 03/29/2021 1140  ? K 3.5  07/15/2021 0437  ? CL 114 (H) 07/15/2021 0437  ? CO2 23 07/15/2021 0437  ? GLUCOSE 92 07/15/2021 0437  ? BUN 35 (H) 07/15/2021 0437  ? BUN 64 (H) 03/29/2021 1140  ? CREATININE 1.52 (H) 07/15/2021 0437  ? CALCIUM 9.5 07/15/2021 0437  ? PROT 4.4 (L) 06/27/2021 0456  ? PROT 6.5 05/04/2021 1531  ? ALBUMIN 1.7 (L) 06/27/2021 0456  ? ALBUMIN 3.4 (L) 05/04/2021 1531  ? AST 13 (L) 06/27/2021 0456  ? ALT 13 06/27/2021 0456  ? ALKPHOS 50 06/27/2021 0456  ? BILITOT 0.5 06/27/2021 0456  ? BILITOT 1.2 05/04/2021 1531  ? GFRNONAA 36 (L) 07/15/2021 0437  ? ? ? ?CBC ?   ?Component Value Date/Time  ? WBC 5.1 08/10/2021 1426  ? WBC 5.9 07/15/2021 0437  ? RBC 3.09 (L) 08/10/2021 1426  ? RBC 3.15 (L) 08/10/2021 1426  ? HGB 9.1 (L) 08/10/2021 1426  ? HCT 29.7 (L) 08/10/2021 1426  ? PLT 256 08/10/2021 1426  ? MCV 94.3 08/10/2021 1426  ? MCH 28.9 08/10/2021 1426  ? MCHC 30.6 08/10/2021 1426  ? RDW 13.9 08/10/2021 1426  ? LYMPHSABS 2.2 08/10/2021 1426  ? MONOABS 0.3 08/10/2021 1426  ? EOSABS 0.1 08/10/2021 1426  ? BASOSABS 0.0 08/10/2021 1426  ? ? ? ?Lab Results  ?Component Value Date  ? TSH 0.022 (L) 08/30/2021  ? TSH <0.080 (L) 08/10/2021  ? TSH 0.545 06/09/2021  ? TSH 0.559 06/08/2021  ? TSH 1.560 05/04/2021  ? TSH 0.152 (L) 03/29/2021  ? FREET4 1.43 08/30/2021  ?  ? ?Thyroid uptake and scan on 09/11/21 ?4 hour I-123 uptake = 2.9% (normal 5-20%),  ?24 hour I-123 uptake = 11.9% (normal 10-30%) ?  ?IMPRESSION: ?1. Decreased iodine uptake at 4 hours,  with low normal uptake at 24 ?hours. ?2. Diffuse heterogeneity of the thyroid parenchyma, with equivocal ?findings for cold nodule corresponding to the mid left lobe thyroid ?lesion seen on prior CT. Correlation with Delorse Lek

## 2021-10-10 ENCOUNTER — Encounter: Payer: Self-pay | Admitting: "Endocrinology

## 2021-10-11 ENCOUNTER — Ambulatory Visit (HOSPITAL_COMMUNITY)
Admission: RE | Admit: 2021-10-11 | Discharge: 2021-10-11 | Disposition: A | Discharge status: Home / Self Care | Payer: Medicare Other | Source: Ambulatory Visit | Attending: "Endocrinology | Admitting: "Endocrinology

## 2021-10-11 ENCOUNTER — Encounter (HOSPITAL_COMMUNITY): Payer: Self-pay

## 2021-10-11 DIAGNOSIS — D440 Neoplasm of uncertain behavior of thyroid gland: Secondary | ICD-10-CM | POA: Diagnosis not present

## 2021-10-11 DIAGNOSIS — E042 Nontoxic multinodular goiter: Secondary | ICD-10-CM

## 2021-10-11 DIAGNOSIS — E041 Nontoxic single thyroid nodule: Secondary | ICD-10-CM | POA: Diagnosis present

## 2021-10-11 MED ORDER — LIDOCAINE HCL (PF) 2 % IJ SOLN
10.0000 mL | Freq: Once | INTRAMUSCULAR | Status: AC
  Administered 2021-10-11: 10 mL

## 2021-10-11 NOTE — Progress Notes (Signed)
Thyroid Biopsy procedure completed at this time with Labs and afirma obtained and sent for pathology. PT pushed via wheelchair to daughter at discharge with no acute distress noted and verbalized understanding of discharge instructions.

## 2021-10-17 NOTE — Progress Notes (Signed)
Patient Care Team: Lindell Spar, MD as PCP - General (Internal Medicine) Mauri Pole, MD as Consulting Physician (Gastroenterology)  DIAGNOSIS:  Encounter Diagnosis  Name Primary?   Normocytic anemia     CHIEF COMPLIANT: Follow-up of Anemia   INTERVAL HISTORY: Amber [name] is a 76 y.o. female is here because of recent diagnosis of Anemia. She presents to the clinic today for a follow-up.  Previously work-up was performed for the normocytic anemia and we felt it was related to anemia of chronic disease.  At that time the discussion was whether to use erythropoietin stimulating agents to make her hemoglobin improved or to continue with the iron supplement.  She decided to continue with iron supplement and came here today for recheck of her labs.  Overall she continues to feel somewhat fatigued and uses a wheelchair for ambulation.   ALLERGIES:  is allergic to shellfish allergy and valium [diazepam].  MEDICATIONS:  Current Outpatient Medications  Medication Sig Dispense Refill   acetaminophen (TYLENOL) 500 MG tablet Take 500 mg by mouth every 6 (six) hours as needed for moderate pain or mild pain.     ALPRAZolam (XANAX) 0.5 MG tablet Take 1 tablet (0.5 mg total) by mouth 2 (two) times daily as needed for anxiety. (Patient taking differently: Take 0.25 mg by mouth 2 (two) times daily as needed for anxiety.) 60 tablet 1   apixaban (ELIQUIS) 5 MG TABS tablet Take 1 tablet (5 mg total) by mouth 2 (two) times daily. 60 tablet 3   busPIRone (BUSPAR) 10 MG tablet Take 1 tablet (10 mg total) by mouth 2 (two) times daily. 60 tablet 1   docusate sodium (COLACE) 100 MG capsule Take 1 capsule by mouth 2 (two) times daily.     ferrous sulfate 325 (65 FE) MG tablet Take 1 tablet (325 mg total) by mouth daily with breakfast. 30 tablet 0   metoprolol succinate (TOPROL-XL) 25 MG 24 hr tablet Take 0.5 tablets (12.5 mg total) by mouth daily. 45 tablet 0   mirtazapine (REMERON) 30 MG tablet Take  1 tablet (30 mg total) by mouth at bedtime. 90 tablet 1   Multiple Vitamin (MULTIVITAMIN WITH MINERALS) TABS tablet Take 1 tablet by mouth daily. 30 tablet 0   OLANZapine zydis (ZYPREXA) 5 MG disintegrating tablet Take 1 tablet (5 mg total) by mouth at bedtime. 30 tablet 1   ondansetron (ZOFRAN) 4 MG tablet Take 1 tablet (4 mg total) by mouth every 6 (six) hours as needed for nausea. 20 tablet 0   polyethylene glycol powder (GLYCOLAX/MIRALAX) 17 GM/SCOOP powder as needed.     sodium bicarbonate 650 MG tablet Take 1 tablet (650 mg total) by mouth 2 (two) times daily. 60 tablet 2   Vitamin D, Ergocalciferol, (DRISDOL) 1.25 MG (50000 UNIT) CAPS capsule Take 1 capsule (50,000 Units total) by mouth every 7 (seven) days. 5 capsule 0   No current facility-administered medications for this visit.    PHYSICAL EXAMINATION: ECOG PERFORMANCE STATUS: 2 - Symptomatic, <50% confined to bed  Vitals:   10/23/21 1439  BP: (!) 157/97  Pulse: 78  Resp: 18  Temp: 98.1 F (36.7 C)  SpO2: 99%   Filed Weights   10/23/21 1439  Weight: 132 lb 3.2 oz (60 kg)      LABORATORY DATA:  I have reviewed the data as listed    Latest Ref Rng & Units 10/23/2021    2:11 PM 07/15/2021    4:37 AM 07/14/2021  5:04 PM  CMP  Glucose 70 - 99 mg/dL 100   92   98    BUN 8 - 23 mg/dL 15   35   37    Creatinine 0.44 - 1.00 mg/dL 1.25   1.52   1.59    Sodium 135 - 145 mmol/L 141   142   141    Potassium 3.5 - 5.1 mmol/L 3.3   3.5   3.6    Chloride 98 - 111 mmol/L 108   114   112    CO2 22 - 32 mmol/L '29   23   24    '$ Calcium 8.9 - 10.3 mg/dL 10.2   9.5   9.7    Total Protein 6.5 - 8.1 g/dL 6.0      Total Bilirubin 0.3 - 1.2 mg/dL 0.4      Alkaline Phos 38 - 126 U/L 58      AST 15 - 41 U/L 9      ALT 0 - 44 U/L 6        Lab Results  Component Value Date   WBC 4.7 10/23/2021   HGB 9.8 (L) 10/23/2021   HCT 30.3 (L) 10/23/2021   MCV 85.6 10/23/2021   PLT 192 10/23/2021   NEUTROABS 2.4 10/23/2021    ASSESSMENT  & PLAN:  Normocytic anemia Hospitalization 06/19/2021-07/17/2021: Depression with refusal to eat and failure to thrive (acute kidney injury), tested positive for COVID, hematuria due to Foley insertion with severe anemia, right lower lobe pneumonia, C. difficile enterocolitis   Bil blood clots with PE: Eliquis until July 2023 She has a CT scan coming up which will then determine if she can come off the blood thinner.  Lab review: 07/16/2021: Hemoglobin 8.3, MCV 93 10/23/2021: Hemoglobin 9.8, MCV 85.6  Diagnosis of anemia: Anemia of chronic disease and acute illness from recent hospitalization Previously we discussed the role of Aranesp versus observation and she chose to do observation (oral iron therapy).  Today her hemoglobin is up to 9.8.  This is without any particular intervention.  Therefore we will continue to watch and monitor.  She will follow with her primary care physician if her hemoglobin drops below 9 then she will come back for discussion regarding Aranesp therapy.    No orders of the defined types were placed in this encounter.  The patient has a good understanding of the overall plan. she agrees with it. she will call with any problems that may develop before the next visit here. Total time spent: 30 mins including face to face time and time spent for planning, charting and co-ordination of care   Harriette Ohara, MD 10/23/21    I Gardiner Coins am scribing for Dr. Lindi Adie  I have reviewed the above documentation for accuracy and completeness, and I agree with the above.

## 2021-10-19 ENCOUNTER — Other Ambulatory Visit: Payer: Self-pay | Admitting: *Deleted

## 2021-10-19 DIAGNOSIS — D649 Anemia, unspecified: Secondary | ICD-10-CM

## 2021-10-19 LAB — CYTOLOGY - NON PAP

## 2021-10-20 LAB — PROLACTIN: Prolactin: 20.6 ng/mL (ref 4.8–23.3)

## 2021-10-20 LAB — LIPID PANEL
Chol/HDL Ratio: 3.5 ratio (ref 0.0–4.4)
Cholesterol, Total: 162 mg/dL (ref 100–199)
HDL: 46 mg/dL (ref >39)
LDL Chol Calc (NIH): 97 mg/dL (ref 0–99)
Triglycerides: 102 mg/dL (ref 0–149)
VLDL Cholesterol Cal: 19 mg/dL (ref 5–40)

## 2021-10-20 LAB — HEMOGLOBIN A1C
Est. average glucose Bld gHb Est-mCnc: 94 mg/dL
Hgb A1c MFr Bld: 4.9 % (ref 4.8–5.6)

## 2021-10-23 ENCOUNTER — Ambulatory Visit (HOSPITAL_COMMUNITY)
Admission: RE | Admit: 2021-10-23 | Discharge: 2021-10-23 | Disposition: A | Discharge status: Home / Self Care | Payer: Medicare Other | Source: Ambulatory Visit | Attending: Internal Medicine | Admitting: Internal Medicine

## 2021-10-23 ENCOUNTER — Inpatient Hospital Stay (HOSPITAL_BASED_OUTPATIENT_CLINIC_OR_DEPARTMENT_OTHER): Payer: Medicare Other | Admitting: Hematology and Oncology

## 2021-10-23 ENCOUNTER — Other Ambulatory Visit: Payer: Self-pay

## 2021-10-23 ENCOUNTER — Other Ambulatory Visit: Payer: Self-pay | Admitting: Internal Medicine

## 2021-10-23 ENCOUNTER — Inpatient Hospital Stay: Payer: Medicare Other | Attending: Hematology and Oncology

## 2021-10-23 ENCOUNTER — Encounter: Payer: Self-pay | Admitting: Internal Medicine

## 2021-10-23 DIAGNOSIS — D649 Anemia, unspecified: Secondary | ICD-10-CM | POA: Insufficient documentation

## 2021-10-23 DIAGNOSIS — R5383 Other fatigue: Secondary | ICD-10-CM | POA: Insufficient documentation

## 2021-10-23 DIAGNOSIS — R071 Chest pain on breathing: Secondary | ICD-10-CM | POA: Insufficient documentation

## 2021-10-23 DIAGNOSIS — I712 Thoracic aortic aneurysm, without rupture, unspecified: Secondary | ICD-10-CM | POA: Insufficient documentation

## 2021-10-23 LAB — CBC WITH DIFFERENTIAL (CANCER CENTER ONLY)
Abs Immature Granulocytes: 0.01 10*3/uL (ref 0.00–0.07)
Basophils Absolute: 0 10*3/uL (ref 0.0–0.1)
Basophils Relative: 1 %
Eosinophils Absolute: 0.2 10*3/uL (ref 0.0–0.5)
Eosinophils Relative: 5 %
HCT: 30.3 % — ABNORMAL LOW (ref 36.0–46.0)
Hemoglobin: 9.8 g/dL — ABNORMAL LOW (ref 12.0–15.0)
Immature Granulocytes: 0 %
Lymphocytes Relative: 36 %
Lymphs Abs: 1.7 10*3/uL (ref 0.7–4.0)
MCH: 27.7 pg (ref 26.0–34.0)
MCHC: 32.3 g/dL (ref 30.0–36.0)
MCV: 85.6 fL (ref 80.0–100.0)
Monocytes Absolute: 0.3 10*3/uL (ref 0.1–1.0)
Monocytes Relative: 7 %
Neutro Abs: 2.4 10*3/uL (ref 1.7–7.7)
Neutrophils Relative %: 51 %
Platelet Count: 192 10*3/uL (ref 150–400)
RBC: 3.54 MIL/uL — ABNORMAL LOW (ref 3.87–5.11)
RDW: 13 % (ref 11.5–15.5)
WBC Count: 4.7 10*3/uL (ref 4.0–10.5)
nRBC: 0 % (ref 0.0–0.2)

## 2021-10-23 LAB — CMP (CANCER CENTER ONLY)
ALT: 6 U/L (ref 0–44)
AST: 9 U/L — ABNORMAL LOW (ref 15–41)
Albumin: 3.2 g/dL — ABNORMAL LOW (ref 3.5–5.0)
Alkaline Phosphatase: 58 U/L (ref 38–126)
Anion gap: 4 — ABNORMAL LOW (ref 5–15)
BUN: 15 mg/dL (ref 8–23)
CO2: 29 mmol/L (ref 22–32)
Calcium: 10.2 mg/dL (ref 8.9–10.3)
Chloride: 108 mmol/L (ref 98–111)
Creatinine: 1.25 mg/dL — ABNORMAL HIGH (ref 0.44–1.00)
GFR, Estimated: 46 mL/min — ABNORMAL LOW (ref >60)
Glucose, Bld: 100 mg/dL — ABNORMAL HIGH (ref 70–99)
Potassium: 3.3 mmol/L — ABNORMAL LOW (ref 3.5–5.1)
Sodium: 141 mmol/L (ref 135–145)
Total Bilirubin: 0.4 mg/dL (ref 0.3–1.2)
Total Protein: 6 g/dL — ABNORMAL LOW (ref 6.5–8.1)

## 2021-10-23 MED ORDER — IOHEXOL 350 MG/ML SOLN
100.0000 mL | Freq: Once | INTRAVENOUS | Status: AC | PRN
  Administered 2021-10-23: 75 mL via INTRAVENOUS

## 2021-10-23 NOTE — Assessment & Plan Note (Signed)
Hospitalization 06/19/2021-07/17/2021: Depression with refusal to eat and failure to thrive (acute kidney injury), tested positive for COVID, hematuria due to Foley insertion with severe anemia, right lower lobe pneumonia, C. difficile enterocolitis  Bil blood clots with PE: Eliquis until July 2023  Diagnosis of anemia: Anemia of chronic disease and acute illness from recent hospitalization Current treatment: Aranesp every 3 weeks started 219 2023x1 dose  Lab review: 07/16/2021: Hemoglobin 8.3, MCV 93

## 2021-10-24 ENCOUNTER — Other Ambulatory Visit: Payer: Self-pay | Admitting: Internal Medicine

## 2021-10-24 DIAGNOSIS — R911 Solitary pulmonary nodule: Secondary | ICD-10-CM

## 2021-10-24 NOTE — Addendum Note (Signed)
Addended byIhor Dow on: 10/24/2021 04:19 PM   Modules accepted: Orders

## 2021-10-29 ENCOUNTER — Encounter: Payer: Self-pay | Admitting: Psychiatry

## 2021-10-29 ENCOUNTER — Ambulatory Visit (INDEPENDENT_AMBULATORY_CARE_PROVIDER_SITE_OTHER): Payer: Medicare Other | Admitting: Psychiatry

## 2021-10-29 VITALS — BP 144/88 | HR 111 | Temp 98.7°F

## 2021-10-29 DIAGNOSIS — F333 Major depressive disorder, recurrent, severe with psychotic symptoms: Secondary | ICD-10-CM | POA: Diagnosis not present

## 2021-10-29 DIAGNOSIS — Z79899 Other long term (current) drug therapy: Secondary | ICD-10-CM | POA: Diagnosis not present

## 2021-10-29 DIAGNOSIS — R946 Abnormal results of thyroid function studies: Secondary | ICD-10-CM

## 2021-10-29 DIAGNOSIS — F411 Generalized anxiety disorder: Secondary | ICD-10-CM

## 2021-10-29 MED ORDER — BUSPIRONE HCL 10 MG PO TABS: 5.0000 mg | ORAL_TABLET | Freq: Two times a day (BID) | ORAL | 1 refills | Status: DC

## 2021-10-29 MED ORDER — FLUOXETINE HCL 20 MG PO TABS: 20.0000 mg | ORAL_TABLET | Freq: Every day | ORAL | 0 refills | Status: DC

## 2021-10-29 NOTE — Patient Instructions (Signed)
Amber [name] - [id number]  Fluoxetine Capsules or Tablets (Depression/Mood Disorders) What is this medication? FLUOXETINE (floo OX e teen) treats depression, anxiety, obsessive-compulsive disorder (OCD), and eating disorders. It increases the amount of serotonin in the brain, a hormone that helps regulate mood. It belongs to a group of medications called SSRIs. This medicine may be used for other purposes; ask your health care provider or pharmacist if you have questions. COMMON BRAND NAME(S): Prozac What should I tell my care team before I take this medication? They need to know if you have any of these conditions: Bipolar disorder or a family history of bipolar disorder Bleeding disorders Glaucoma Heart disease Liver disease Low levels of sodium in the blood Seizures Suicidal thoughts, plans, or attempt; a previous suicide attempt by you or a family member Take MAOIs like Carbex, Eldepryl, Marplan, Nardil, and Parnate Take medications that treat or prevent blood clots Thyroid disease An unusual or allergic reaction to fluoxetine, other medications, foods, dyes, or preservatives Pregnant or trying to get pregnant Breast-feeding How should I use this medication? Take this medication by mouth with a glass of water. Follow the directions on the prescription label. You can take this medication with or without food. Take your medication at regular intervals. Do not take it more often than directed. Do not stop taking this medication suddenly except upon the advice of your care team. Stopping this medication too quickly may cause serious side effects or your condition may worsen. A special MedGuide will be given to you by the pharmacist with each prescription and refill. Be sure to read this information carefully each time. Talk to your care team about the use of this medication in children. While it may be prescribed for children as young as 7 years for selected conditions, precautions do  apply. Overdosage: If you think you have taken too much of this medicine contact a poison control center or emergency room at once. NOTE: This medicine is only for you. Do not share this medicine with others. What if I miss a dose? If you miss a dose, skip the missed dose and go back to your regular dosing schedule. Do not take double or extra doses. What may interact with this medication? Do not take this medication with any of the following: Other medications containing fluoxetine, like Sarafem or Symbyax Cisapride Dronedarone Linezolid MAOIs like Carbex, Eldepryl, Marplan, Nardil, and Parnate Methylene blue (injected into a vein) Pimozide Thioridazine This medication may also interact with the following: Alcohol Amphetamines Aspirin and aspirin-like medications Carbamazepine Certain medications for depression, anxiety, or psychotic disturbances Certain medications for migraine headaches like almotriptan, eletriptan, frovatriptan, naratriptan, rizatriptan, sumatriptan, zolmitriptan Digoxin Diuretics Fentanyl Flecainide Furazolidone Isoniazid Lithium Medications for sleep Medications that treat or prevent blood clots like warfarin, enoxaparin, and dalteparin NSAIDs, medications for pain and inflammation, like ibuprofen or naproxen Other medications that prolong the QT interval (an abnormal heart rhythm) Phenytoin Procarbazine Propafenone Rasagiline Ritonavir Supplements like St. John's wort, kava kava, valerian Tramadol Tryptophan Vinblastine This list may not describe all possible interactions. Give your health care provider a list of all the medicines, herbs, non-prescription drugs, or dietary supplements you use. Also tell them if you smoke, drink alcohol, or use illegal drugs. Some items may interact with your medicine. What should I watch for while using this medication? Tell your care team if your symptoms do not get better or if they get worse. Visit your care team  for regular checks on your progress. Because it may take  several weeks to see the full effects of this medication, it is important to continue your treatment as prescribed. Watch for new or worsening thoughts of suicide or depression. This includes sudden changes in mood, behavior, or thoughts. These changes can happen at any time but are more common in the beginning of treatment or after a change in dose. Call your care team right away if you experience these thoughts or worsening depression. Manic episodes may happen in patients with bipolar disorder who take this medication. Watch for changes in feelings or behaviors such as feeling anxious, nervous, agitated, panicky, irritable, hostile, aggressive, impulsive, severely restless, overly excited and hyperactive, or trouble sleeping. These symptoms can happen at anytime but are more common in the beginning of treatment or after a change in dose. Call your care team right away if you notice any of these symptoms. You may get drowsy or dizzy. Do not drive, use machinery, or do anything that needs mental alertness until you know how this medication affects you. Do not stand or sit up quickly, especially if you are an older patient. This reduces the risk of dizzy or fainting spells. Alcohol may interfere with the effect of this medication. Avoid alcoholic drinks. Your mouth may get dry. Chewing sugarless gum or sucking hard candy, and drinking plenty of water may help. Contact your care team if the problem does not go away or is severe. This medication may affect blood sugar levels. If you have diabetes, check with your care team before you make changes to your diet or medications. What side effects may I notice from receiving this medication? Side effects that you should report to your care team as soon as possible: Allergic reactions--skin rash, itching, hives, swelling of the face, lips, tongue, or throat Bleeding--bloody or black, tar-like stools, red or  dark brown urine, vomiting blood or brown material that looks like coffee grounds, small, red or purple spots on skin, unusual bleeding or bruising Heart rhythm changes--fast or irregular heartbeat, dizziness, feeling faint or lightheaded, chest pain, trouble breathing Loss of appetite with weight loss Low sodium level--muscle weakness, fatigue, dizziness, headache, confusion Serotonin syndrome--irritability, confusion, fast or irregular heartbeat, muscle stiffness, twitching muscles, sweating, high fever, seizure, chills, vomiting, diarrhea Sudden eye pain or change in vision such as blurry vision, seeing halos around lights, vision loss Thoughts of suicide or self-harm, worsening mood, feelings of depression Side effects that usually do not require medical attention (report to your care team if they continue or are bothersome): Anxiety, nervousness Change in sex drive or performance Diarrhea Dry mouth Headache Excessive sweating Nausea Tremors or shaking Trouble sleeping Upset stomach This list may not describe all possible side effects. Call your doctor for medical advice about side effects. You may report side effects to FDA at 1-800-FDA-1088. Where should I keep my medication? Keep out of the reach of children and pets. Store at room temperature between 15 and 30 degrees C (59 and 86 degrees F). Get rid of any unused medication after the expiration date. NOTE: This sheet is a summary. It may not cover all possible information. If you have questions about this medicine, talk to your doctor, pharmacist, or health care provider.  2023 Elsevier/Gold Standard (2020-07-26 00:00:00)

## 2021-10-29 NOTE — Progress Notes (Signed)
[facility] MD OP Progress Note  10/29/2021 4:02 PM Amber [name]  MRN:  [id number]  Chief Complaint:  Chief Complaint  Patient presents with   Follow-up: 76 year old Caucasian female with history of MDD, GAD, thyroid abnormalities, multiple other medical problems, presented for medication management.   HPI: Amber [name] is a 76 year old Caucasian female, married, lives in [place], has a history of MDD, GAD, hypothyroidism, chronic kidney disease, secondary hyperparathyroidism was evaluated in office today.  Patient as well as daughter-Ms. [name] participated in the evaluation.  Patient today appeared to be [name], oriented, was able to answer questions appropriately.  Patient continues to have anxiety, as well as low motivation, sadness per report.  She however believes her anxiety is worse than her depression.  She is currently compliant on the medications however does not believe the medications is beneficial.  The BuSpar higher dosage has not been helpful.  Patient reports sleep is overall okay.  Patient denies any suicidality, homicidality or perceptual disturbances.  As per daughter-patient continues to exhibit behavioral problems likely induced by anxiety.  As per daughter patient seems to be very clingy, demanding all the time.  She wants someone to be with her all the time since she is worried she is going to die.  She does not let her husband to go out much since she is worried about being alone.  She does have 24/7 care and someone is always with her.  She also has a Actuary some days.  Her daughter stays with her most of the time and her husband is also there when he is not working.  Patient however ruminates a lot and is unable to focus or get herself involved in any activities.  As per daughter patient is currently undergoing investigation for her thyroid nodules, completed biopsy and has upcoming appointment with endocrinology on Wednesday.  Per endocrinologist they were advised to stay on  the Xanax for now since her thyroid problems are not corrected yet.  She currently takes 1/2 tablet of the Xanax during the day and 1 at night.  Has not been able to find a therapist yet however is motivated to do so.  Visit Diagnosis:    ICD-10-CM   1. MDD (major depressive disorder), recurrent, severe, with psychosis ([place])  F33.3 busPIRone (BUSPAR) 10 MG tablet    FLUoxetine (PROZAC) 20 MG tablet    2. GAD (generalized anxiety disorder)  F41.1 busPIRone (BUSPAR) 10 MG tablet    FLUoxetine (PROZAC) 20 MG tablet    3. High risk medication use  Z79.899     4. Thyroid function test abnormal  R94.6       Past Psychiatric History: Reviewed past psychiatric history from progress note on 09/25/2021.  Past trials of medications like Lexapro, Celexa.  Patient was admitted to internal medicine service at [facility] for failure to thrive in January 2023-later on transferred to [facility] for ECT treatments in February 2023.  Past Medical History:  Past Medical History:  Diagnosis Date   ABLA (acute blood loss anemia) 06/19/2021   Anxiety    Aspiration pneumonia of right lung due to gastric secretions (HCC)    C. difficile colitis 07/13/2021   COVID-19 virus infection 06/08/2021    Hypertension    Kidney disease    Stroke [facility])    History reviewed. No pertinent surgical history.  Family Psychiatric History: Reviewed family psychiatric history from progress note on 09/25/2021.  Family History:  Family History  Problem Relation Age of Onset  High blood pressure Maternal Grandmother     Social History: Reviewed social history from progress note on 09/25/2021. Social History   Socioeconomic History   Marital status: Married    Spouse name: Not on file   Number of children: 5   Years of education: Not on file   Highest education level: Not on file  Occupational History   Not on file  Tobacco Use   Smoking status: Former    Types: Cigarettes   Smokeless tobacco: Never  Vaping Use    Vaping Use: Never used  Substance and Sexual Activity   Alcohol use: Not Currently   Drug use: Not Currently   Sexual activity: Not Currently  Other Topics Concern   Not on file  Social History Narrative   Right handed    one story home    lives with family    Doesn't work currently   Social Determinants of Radio broadcast assistant Strain: Not on file  Food Insecurity: Not on file  Transportation Needs: Not on file  Physical Activity: Not on file  Stress: Not on file  Social Connections: Not on file    Allergies:  Allergies  Allergen Reactions   Shellfish Allergy     "I pass out"   Valium [Diazepam] Other (See Comments)    Makes her want to " fly"    Metabolic Disorder Labs: Lab Results  Component Value Date   HGBA1C 4.9 10/19/2021   Lab Results  Component Value Date   PROLACTIN 20.6 10/19/2021   Lab Results  Component Value Date   CHOL 162 10/19/2021   TRIG 102 10/19/2021   HDL 46 10/19/2021   CHOLHDL 3.5 10/19/2021   LDLCALC 97 10/19/2021   Lab Results  Component Value Date   TSH 0.022 (L) 08/30/2021   TSH <0.080 (L) 08/10/2021    Therapeutic Level Labs: No results found for: LITHIUM No results found for: VALPROATE No components found for:  CBMZ  Current Medications: Current Outpatient Medications  Medication Sig Dispense Refill   acetaminophen (TYLENOL) 500 MG tablet Take 500 mg by mouth every 6 (six) hours as needed for moderate pain or mild pain.     ALPRAZolam (XANAX) 0.5 MG tablet Take 1 tablet (0.5 mg total) by mouth 2 (two) times daily as needed for anxiety. (Patient taking differently: Take 0.25 mg by mouth 2 (two) times daily as needed for anxiety.) 60 tablet 1   apixaban (ELIQUIS) 5 MG TABS tablet Take 1 tablet (5 mg total) by mouth 2 (two) times daily. 60 tablet 3   docusate sodium (COLACE) 100 MG capsule Take 1 capsule by mouth 2 (two) times daily.     ferrous sulfate 325 (65 FE) MG tablet Take 1 tablet (325 mg total) by mouth  daily with breakfast. 30 tablet 0   FLUoxetine (PROZAC) 20 MG tablet Take 1 tablet (20 mg total) by mouth daily with breakfast. 30 tablet 0   metoprolol succinate (TOPROL-XL) 25 MG 24 hr tablet Take 0.5 tablets (12.5 mg total) by mouth daily. 45 tablet 0   mirtazapine (REMERON) 30 MG tablet Take 1 tablet (30 mg total) by mouth at bedtime. 90 tablet 1   Multiple Vitamin (MULTIVITAMIN WITH MINERALS) TABS tablet Take 1 tablet by mouth daily. 30 tablet 0   OLANZapine zydis (ZYPREXA) 5 MG disintegrating tablet Take 1 tablet (5 mg total) by mouth at bedtime. 30 tablet 1   ondansetron (ZOFRAN) 4 MG tablet Take 1 tablet (4 mg total) by  mouth every 6 (six) hours as needed for nausea. 20 tablet 0   polyethylene glycol powder (GLYCOLAX/MIRALAX) 17 GM/SCOOP powder as needed.     sodium bicarbonate 650 MG tablet Take 1 tablet (650 mg total) by mouth 2 (two) times daily. 60 tablet 2   Vitamin D, Ergocalciferol, (DRISDOL) 1.25 MG (50000 UNIT) CAPS capsule Take 1 capsule (50,000 Units total) by mouth every 7 (seven) days. 5 capsule 0   busPIRone (BUSPAR) 10 MG tablet Take 0.5 tablets (5 mg total) by mouth 2 (two) times daily. 30 tablet 1   No current facility-administered medications for this visit.     Musculoskeletal: Strength & Muscle Tone: within normal limits Gait & Station:  wheel chair bound Patient leans: N/A  Psychiatric Specialty Exam: Review of Systems  Psychiatric/Behavioral:  Positive for decreased concentration and dysphoric mood. The patient is nervous/anxious.   All other systems reviewed and are negative.  Blood pressure (!) 144/88, pulse (!) 111, temperature 98.7 F (37.1 C), temperature source Temporal.There is no height or weight on file to calculate BMI.  General Appearance: Casual  Eye Contact:  Good  Speech:  Clear and Coherent  Volume:  Normal  Mood:  Anxious and Depressed  Affect:  Congruent  Thought Process:  Goal Directed and Descriptions of Associations: Intact   Orientation:  Full (Time, Place, and Person)  Thought Content: Logical   Suicidal Thoughts:  No  Homicidal Thoughts:  No  Memory:  Immediate;   Fair Recent;   Fair Remote;   limited  Judgement:  Fair  Insight:  Fair  Psychomotor Activity:  Normal  Concentration:  Concentration: Fair and Attention Span: Fair  Recall:  [employer] of Knowledge: Fair  Language: Fair  Akathisia:  No  Handed:  Right  AIMS (if indicated): done  Assets:  Communication Skills Desire for Improvement Housing Social Support Transportation  ADL's:  Intact  Cognition: WNL  Sleep:  Fair   Screenings: [place] Office Visit from 10/29/2021 in [place] Office Visit from 09/25/2021 in [place] Total Score 0 0      ECT-MADRS    [place] Admission (Discharged) from 06/19/2021 in [place] Most recent reading at 07/09/2021  1:24 PM ECT Treatment from 06/25/2021 in [place] Most recent reading at 06/25/2021  3:22 PM  MADRS Total Score 13 [place] Office Visit from 03/29/2021 in [facility]  Total GAD-7 Score 11      PHQ2-9    [place] Visit from 10/29/2021 in [facility] [place] from 08/16/2021 in [place] Visit from 04/25/2021 in [place] Visit from 03/29/2021 in [facility]  PHQ-2 Total Score 5 5 0 5  PHQ-9 Total Score 17 16 0 15      [place] Visit from 10/29/2021 in [facility] from 09/25/2021 in [place] Admission (Discharged) from 06/19/2021 in [place] No Risk No Risk No Risk        Assessment and Plan: ROYAL BEIRNE is a 76 year old Caucasian female  who currently lives in [place] with her husband, has a history of MDD, anxiety disorder, completed ECT treatments for failure to thrive-February 2023, presented for medication management.  Patient  continues to struggle with anxiety, and will benefit from the following plan.  Plan MDD with psychosis-improving Olanzapine 5 mg at bedtime Mirtazapine 30 mg at bedtime Reduce BuSpar to 5 mg p.o. twice daily Start Prozac 20 mg p.o. daily in the morning with breakfast Patient as well as daughter advised the need for psychotherapy-provided resources. Slums completed in session-09/25/2021-27 out of 30.  GAD-unstable Start Prozac 20 mg p.o. daily in the morning with breakfast Reduce BuSpar to 5 mg p.o. twice daily-plan to taper off. Xanax-takes 0.25 mg during the day and 0.5 mg at bedtime as needed.  Long-term plan is to taper it off.  Provided education about long-term adverse side effects due to benzodiazepine therapy.  High risk medication use-reviewed sodium level-10/23/2021-141-within normal limits, creatinine 1.25-chronic-downtrending.  Thyroid function abnormality-unstable Patient is currently under the care of endocrinologist-Dr. Dorris Fetch.  Had recent biopsy completed.  Patient has upcoming appointment.  Will coordinate care.  Patient with solitary pulmonary nodule.  Patient with elevated blood pressure and heart rate reading in session today, will need to monitor and follow up with primary care provider.   Collateral information obtained from daughter-Melisa as noted above.  Follow-up in clinic in 4 weeks or sooner if needed. This note was generated in part or whole with voice recognition software. Voice recognition is usually quite accurate but there are transcription errors that can and very often do occur. I apologize for any typographical errors that were not detected and corrected.     Amber Alert, MD 10/29/2021, 4:02 PM

## 2021-10-31 ENCOUNTER — Ambulatory Visit: Payer: Medicare Other | Admitting: "Endocrinology

## 2021-11-01 ENCOUNTER — Other Ambulatory Visit: Payer: Self-pay | Admitting: Internal Medicine

## 2021-11-01 DIAGNOSIS — R000 Tachycardia, unspecified: Secondary | ICD-10-CM

## 2021-11-05 ENCOUNTER — Encounter (HOSPITAL_COMMUNITY): Payer: Self-pay

## 2021-11-07 ENCOUNTER — Encounter: Payer: Self-pay | Admitting: "Endocrinology

## 2021-11-07 ENCOUNTER — Ambulatory Visit (INDEPENDENT_AMBULATORY_CARE_PROVIDER_SITE_OTHER): Payer: Medicare Other | Admitting: "Endocrinology

## 2021-11-07 VITALS — BP 150/88 | HR 68 | Ht 63.0 in | Wt 128.0 lb

## 2021-11-07 DIAGNOSIS — E0590 Thyrotoxicosis, unspecified without thyrotoxic crisis or storm: Secondary | ICD-10-CM

## 2021-11-07 DIAGNOSIS — E042 Nontoxic multinodular goiter: Secondary | ICD-10-CM | POA: Diagnosis not present

## 2021-11-07 MED ORDER — METHIMAZOLE 5 MG PO TABS: 5.0000 mg | ORAL_TABLET | Freq: Every day | ORAL | 3 refills | Status: DC

## 2021-11-07 NOTE — Progress Notes (Signed)
11/07/2021    Endocrinology follow-up note   Subjective:    Patient ID: Amber [name], female    DOB: Sep 25, 1945, PCP Amber Spar, MD.   Past Medical History:  Diagnosis Date   ABLA (acute blood loss anemia) 06/19/2021   Anxiety    Aspiration pneumonia of right lung due to gastric secretions (Amber [name])    C. difficile colitis 07/13/2021   COVID-19 virus infection 06/08/2021    Hypertension    Kidney disease    Stroke Amber [name])     History reviewed. No pertinent surgical history.  Social History   Socioeconomic History   Marital status: Married    Spouse name: Not on file   Number of children: 5   Years of education: Not on file   Highest education level: Not on file  Occupational History   Not on file  Tobacco Use   Smoking status: Former    Types: Cigarettes   Smokeless tobacco: Never  Vaping Use   Vaping Use: Never used  Substance and Sexual Activity   Alcohol use: Not Currently   Drug use: Not Currently   Sexual activity: Not Currently  Other Topics Concern   Not on file  Social History Narrative   Right handed    one story home    lives with family    Doesn't work currently   Social Determinants of Radio broadcast assistant Strain: Not on file  Food Insecurity: Not on file  Transportation Needs: Not on file  Physical Activity: Not on file  Stress: Not on file  Social Connections: Not on file    Family History  Problem Relation Age of Onset   High blood pressure Maternal Grandmother     Outpatient Encounter Medications as of 11/07/2021  Medication Sig   methimazole (TAPAZOLE) 5 MG tablet Take 1 tablet (5 mg total) by mouth daily.   acetaminophen (TYLENOL) 500 MG tablet Take 500 mg by mouth every 6 (six) hours as needed for moderate pain or mild pain.   ALPRAZolam (XANAX) 0.5 MG tablet Take 1 tablet (0.5 mg total) by mouth 2 (two) times daily as needed for anxiety. (Patient taking differently: Take 0.25 mg by mouth 2 (two) times daily as  needed for anxiety.)   apixaban (ELIQUIS) 5 MG TABS tablet Take 1 tablet (5 mg total) by mouth 2 (two) times daily.   busPIRone (BUSPAR) 10 MG tablet Take 0.5 tablets (5 mg total) by mouth 2 (two) times daily.   docusate sodium (COLACE) 100 MG capsule Take 1 capsule by mouth 2 (two) times daily.   ferrous sulfate 325 (65 FE) MG tablet Take 1 tablet (325 mg total) by mouth daily with breakfast.   FLUoxetine (PROZAC) 20 MG tablet Take 1 tablet (20 mg total) by mouth daily with breakfast.   metoprolol succinate (TOPROL-XL) 25 MG 24 hr tablet TAKE 1/2 TABLET BY MOUTH ONCE DAILY   mirtazapine (REMERON) 30 MG tablet Take 1 tablet (30 mg total) by mouth at bedtime.   Multiple Vitamin (MULTIVITAMIN WITH MINERALS) TABS tablet Take 1 tablet by mouth daily.   OLANZapine zydis (ZYPREXA) 5 MG disintegrating tablet Take 1 tablet (5 mg total) by mouth at bedtime.   ondansetron (ZOFRAN) 4 MG tablet Take 1 tablet (4 mg total) by mouth every 6 (six) hours as needed for nausea.   polyethylene glycol powder (GLYCOLAX/MIRALAX) 17 GM/SCOOP powder as needed.   sodium bicarbonate 650 MG tablet Take 1 tablet (650 mg total) by  mouth 2 (two) times daily.   Vitamin D, Ergocalciferol, (DRISDOL) 1.25 MG (50000 UNIT) CAPS capsule Take 1 capsule (50,000 Units total) by mouth every 7 (seven) days.   No facility-administered encounter medications on file as of 11/07/2021.    ALLERGIES: Allergies  Allergen Reactions   Shellfish Allergy     "I pass out"   Valium [Diazepam] Other (See Comments)    Makes her want to " fly"    VACCINATION STATUS:  There is no immunization history on file for this patient.   HPI  [name] is 76 y.o. female who presents today with a medical history as above. she is being seen in follow-up after she was seen in consultation for hyperthyroidism requested by Amber Spar, MD.   Her work-up is complete .  After thyroid uptake and scan showed cold nodule in the left lobe of her thyroid,  she was considered for dedicated thyroid ultrasound which confirms 2.8 cm suspicious nodule corresponding to the area of cold nodule on the uptake and scan. She underwent  FNA biopsy of this nodule with inconclusive cytology which needed molecular study. Her afirma results are benign. she has been dealing with symptoms of anxiety, weight loss, sleep disturbance for several weeks.  These symptoms are progressively worsening and troubling to her. her most recent thyroid labs revealed  suppressed TSH . She is not on antithyroid meds. She denies any prior thyroid dysfunction.  she denies dysphagia, choking, shortness of breath, no recent voice change.    she denies family history of thyroid dysfunction nor thyroid malignancy. she denies personal history of goiter. she is not on any anti-thyroid medications nor on any thyroid hormone supplements. she  is willing to proceed with appropriate work up and therapy for thyrotoxicosis. She is accompanied by her daughter.                           Review of systems  Constitutional: + weight loss, + fatigue, + subjective hyperthermia Eyes: no blurry vision, +xerophthalmia ENT: no sore throat, no nodules palpated in throat, no dysphagia/odynophagia, nor hoarseness Cardiovascular: no Chest Pain, no Shortness of Breath, - palpitations ( patient on metoprolol) , no leg swelling Respiratory: no cough, no SOB Gastrointestinal: no Nausea, no Vomiting, no Diarhhea Musculoskeletal: no muscle/joint aches Skin: no rashes Neurological: + tremors, no numbness, no tingling, no dizziness Psychiatric: no depression, +anxiety   Objective:    BP (!) 150/88   Pulse 68   Ht '5\' 3"'$  (1.6 m)   Wt 128 lb (58.1 kg)   BMI 22.67 kg/m   Wt Readings from Last 3 Encounters:  11/07/21 128 lb (58.1 kg)  10/23/21 132 lb 3.2 oz (60 kg)  10/03/21 135 lb (61.2 kg)                                                Physical exam  Constitutional: Body mass index is 22.67 kg/m.,  not in acute distress, + anxious state of mind Eyes: PERRLA, EOMI,  exophthalmos ENT: moist mucous membranes, - thyromegaly, no cervical lymphadenopathy Cardiovascular: + norma precordial activity, - tachycardic,  no Murmur/Rubs/Gallops Respiratory:  adequate breathing efforts, no gross chest deformity, Clear to auscultation bilaterally Gastrointestinal: abdomen soft, Non -tender, No distension, Bowel Sounds present Musculoskeletal: no gross deformities, strength intact in all  four extremities, + dependent bilateral LE edema. Skin: moist, warm, no rashes Neurological: +tremor with outstretched hands,  ++Deep Tendon Reflexes  on both lower extremities.   CMP     Component Value Date/Time   NA 141 10/23/2021 1411   NA 143 03/29/2021 1140   K 3.3 (L) 10/23/2021 1411   CL 108 10/23/2021 1411   CO2 29 10/23/2021 1411   GLUCOSE 100 (H) 10/23/2021 1411   BUN 15 10/23/2021 1411   BUN 64 (H) 03/29/2021 1140   CREATININE 1.25 (H) 10/23/2021 1411   CALCIUM 10.2 10/23/2021 1411   PROT 6.0 (L) 10/23/2021 1411   PROT 6.5 05/04/2021 1531   ALBUMIN 3.2 (L) 10/23/2021 1411   ALBUMIN 3.4 (L) 05/04/2021 1531   AST 9 (L) 10/23/2021 1411   ALT 6 10/23/2021 1411   ALKPHOS 58 10/23/2021 1411   BILITOT 0.4 10/23/2021 1411   GFRNONAA 46 (L) 10/23/2021 1411     CBC    Component Value Date/Time   WBC 4.7 10/23/2021 1411   WBC 5.9 07/15/2021 0437   RBC 3.54 (L) 10/23/2021 1411   HGB 9.8 (L) 10/23/2021 1411   HCT 30.3 (L) 10/23/2021 1411   PLT 192 10/23/2021 1411   MCV 85.6 10/23/2021 1411   MCH 27.7 10/23/2021 1411   MCHC 32.3 10/23/2021 1411   RDW 13.0 10/23/2021 1411   LYMPHSABS 1.7 10/23/2021 1411   MONOABS 0.3 10/23/2021 1411   EOSABS 0.2 10/23/2021 1411   BASOSABS 0.0 10/23/2021 1411     Lab Results  Component Value Date   TSH 0.022 (L) 08/30/2021   TSH <0.080 (L) 08/10/2021   TSH 0.545 06/09/2021   TSH 0.559 06/08/2021   TSH 1.560 05/04/2021   TSH 0.152 (L) 03/29/2021    FREET4 1.43 08/30/2021     Thyroid uptake and scan on 09/11/21 4 hour I-123 uptake = 2.9% (normal 5-20%),  24 hour I-123 uptake = 11.9% (normal 10-30%)   IMPRESSION: 1. Decreased iodine uptake at 4 hours, with low normal uptake at 24 hours. 2. Diffuse heterogeneity of the thyroid parenchyma, with equivocal findings for cold nodule corresponding to the mid left lobe thyroid lesion seen on prior CT. Correlation with ultrasound is recommended to help guide possible sampling.   FNA- AUS Afirma - Benign  Assessment & Plan:   1. Hyperthyroidism 2.  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  I reviewed her labs and thyroid scan with her and her daughter.  I also discussed her most recent thyroid imaging-ultrasound confirming multinodular goiter.  The ultrasound confirmed 2.8 cm nodule on the left lobe corresponding to the cold nodule identified on thyroid uptake and scan.  This is also corresponding to the lesion seen on CT scanning.  Her thyroid function tests are c/w subclinical hyperthyroidism She needed fine-needle aspiration of the suspicious nodule to rule out malignancy.  Afirma study showed benign finding. Due to low  uptake , she will not need ablative therapy. She will be considered for methimazole therapy and she and her guardian agree. I discussed and prescribed methimazole 5 mg po daily with breakfast with plan to repeat TFTs and office visit in 3 months Side effects and precausions discussed with the family.   She is already on Metoprolol, no need for additional Beta blocker.  She is advised to wear a compression LE sleeve to help with peripheral dependent edema. -Patient is advised to maintain close follow up with Amber Spar, MD for  primary care needs.  I spent 21 minutes in the care of the patient today including review of labs from Thyroid Function, CMP, and other relevant labs ; imaging/biopsy records (current and  previous including abstractions from other facilities); face-to-face time discussing  her lab results and symptoms, medications doses, her options of short and long term treatment based on the latest standards of care / guidelines;   and documenting the encounter.  Amber [name]  participated in the discussions, expressed understanding, and voiced agreement with the above plans.  All questions were answered to her satisfaction. she is encouraged to contact clinic should she have any questions or concerns prior to her return visit.      Follow up plan: Return in about 3 months (around 02/07/2022) for F/U with Pre-visit Labs.   Thank you for involving me in the care of this pleasant patient, and I will continue to update you with her progress.  Glade Lloyd, MD [facility] Phone: [phone]  Fax: [phone]   11/07/2021, 10:50 PM  This note was partially dictated with voice recognition software. Similar sounding words can be transcribed inadequately or may not  be corrected upon review.

## 2021-11-08 ENCOUNTER — Telehealth: Payer: Self-pay | Admitting: Internal Medicine

## 2021-11-08 ENCOUNTER — Encounter (HOSPITAL_COMMUNITY)
Admission: RE | Admit: 2021-11-08 | Discharge: 2021-11-08 | Disposition: A | Discharge status: Home / Self Care | Payer: Medicare Other | Source: Ambulatory Visit | Attending: Internal Medicine | Admitting: Internal Medicine

## 2021-11-08 ENCOUNTER — Telehealth: Payer: Self-pay | Admitting: Family Medicine

## 2021-11-08 DIAGNOSIS — R911 Solitary pulmonary nodule: Secondary | ICD-10-CM | POA: Insufficient documentation

## 2021-11-08 MED ORDER — FLUDEOXYGLUCOSE F - 18 (FDG) INJECTION
6.6800 | Freq: Once | INTRAVENOUS | Status: AC | PRN
  Administered 2021-11-08: 6.68 via INTRAVENOUS

## 2021-11-08 NOTE — Telephone Encounter (Signed)
Critical report called in by [facility] radiology. Report printed and given directly to dr simpson for review at 1:55pm

## 2021-11-08 NOTE — Telephone Encounter (Signed)
Pls call pt , the pet scan reports right kidney stone causing obstruction, also some abnormality in the bladder She needs URGENT urology evaluation, if in pain with this kidney stone then needs to  gpo to the ED If no pain, try to geturology appt with her today or tomorrow There are other abn on PET and wil need to review thes with Dr Posey Pronto on his return asap

## 2021-11-08 NOTE — Telephone Encounter (Signed)
Pt already follows urology over this stone and he the dr feels like he cant remove this right now she does have appt with urology 6-26 advised patel would contact upon return

## 2021-11-08 NOTE — Telephone Encounter (Signed)
LVM for Amber [name] daughter to call theoffice

## 2021-11-19 ENCOUNTER — Ambulatory Visit: Payer: Medicare Other | Admitting: Internal Medicine

## 2021-11-21 ENCOUNTER — Encounter: Payer: Self-pay | Admitting: Psychiatry

## 2021-11-21 ENCOUNTER — Encounter: Payer: Self-pay | Admitting: Internal Medicine

## 2021-11-21 ENCOUNTER — Ambulatory Visit (INDEPENDENT_AMBULATORY_CARE_PROVIDER_SITE_OTHER): Payer: Medicare Other | Admitting: Internal Medicine

## 2021-11-21 ENCOUNTER — Telehealth (INDEPENDENT_AMBULATORY_CARE_PROVIDER_SITE_OTHER): Payer: Medicare Other | Admitting: Psychiatry

## 2021-11-21 VITALS — BP 136/88 | HR 62 | Resp 18 | Wt 132.2 lb

## 2021-11-21 DIAGNOSIS — I2699 Other pulmonary embolism without acute cor pulmonale: Secondary | ICD-10-CM

## 2021-11-21 DIAGNOSIS — F333 Major depressive disorder, recurrent, severe with psychotic symptoms: Secondary | ICD-10-CM | POA: Diagnosis not present

## 2021-11-21 DIAGNOSIS — N1831 Chronic kidney disease, stage 3a: Secondary | ICD-10-CM | POA: Diagnosis not present

## 2021-11-21 DIAGNOSIS — E210 Primary hyperparathyroidism: Secondary | ICD-10-CM | POA: Diagnosis not present

## 2021-11-21 DIAGNOSIS — R000 Tachycardia, unspecified: Secondary | ICD-10-CM

## 2021-11-21 DIAGNOSIS — Z1159 Encounter for screening for other viral diseases: Secondary | ICD-10-CM

## 2021-11-21 DIAGNOSIS — F323 Major depressive disorder, single episode, severe with psychotic features: Secondary | ICD-10-CM

## 2021-11-21 DIAGNOSIS — E440 Moderate protein-calorie malnutrition: Secondary | ICD-10-CM | POA: Diagnosis not present

## 2021-11-21 DIAGNOSIS — F411 Generalized anxiety disorder: Secondary | ICD-10-CM

## 2021-11-21 DIAGNOSIS — K5904 Chronic idiopathic constipation: Secondary | ICD-10-CM

## 2021-11-21 DIAGNOSIS — R946 Abnormal results of thyroid function studies: Secondary | ICD-10-CM | POA: Diagnosis not present

## 2021-11-21 MED ORDER — BUSPIRONE HCL 5 MG PO TABS: 5.0000 mg | ORAL_TABLET | Freq: Every morning | ORAL | 1 refills | Status: DC

## 2021-11-21 MED ORDER — FLUOXETINE HCL 40 MG PO CAPS: 40.0000 mg | ORAL_CAPSULE | Freq: Every day | ORAL | 0 refills | Status: DC

## 2021-11-21 MED ORDER — SODIUM BICARBONATE 650 MG PO TABS
650.0000 mg | ORAL_TABLET | Freq: Every day | ORAL | 2 refills | Status: DC
Start: 2021-11-21 — End: 2022-03-14

## 2021-11-21 MED ORDER — OLANZAPINE 5 MG PO TBDP: 5.0000 mg | ORAL_TABLET | Freq: Every day | ORAL | 1 refills | Status: DC

## 2021-11-21 NOTE — Progress Notes (Signed)
Virtual Visit via Video Note  I connected with Amber [name] on 11/21/21 at  9:40 AM EDT by a video enabled telemedicine application and verified that I am speaking with the correct person using two identifiers.  Location Provider Location : [facility] Patient Location : Home  Participants: Patient , [place], Provider   I discussed the limitations of evaluation and management by telemedicine and the availability of in person appointments. The patient expressed understanding and agreed to proceed.   I discussed the assessment and treatment plan with the patient. The patient was provided an opportunity to ask questions and all were answered. The patient agreed with the plan and demonstrated an understanding of the instructions.   The patient was advised to call back or seek an in-person evaluation if the symptoms worsen or if the condition fails to improve as anticipated.   [place] MD OP Progress Note  11/21/2021 12:15 PM Amber [name]  MRN:  [id number]  Chief Complaint:  Chief Complaint  Patient presents with   Follow-up: 77 year old Caucasian female with history of MDD, GAD, thyroid abnormalities, multiple other medical problems, presented for medication management.   HPI: Amber [name] is a 76 year old Caucasian female, married, lives in [place], has a history of MDD, GAD, hypothyroidism, chronic kidney disease, secondary hyperparathyroidism was evaluated by telemedicine today.  Patient as well as daughter-Ms. Amber [name] participated in the evaluation today.  Daughter provided collateral information since patient is a limited historian.  Patient appeared to be alert, oriented to person place time and situation.  Patient reports she is currently taking the Prozac which was started last visit on 10/29/2021.  Does report nausea however reports she has had nausea since the past several months.  Otherwise denies any other concerns.  Patient reports she has been able to take walks outside more often  however that does make her anxious.  Patient denied any suicidality, homicidality or perceptual disturbances.  Per daughter patient is currently compliant on her Prozac.  Denies any side effects at this time.  She does have a history of nausea however she had it even before the Prozac was started and it comes and goes.  She does have fear of swallowing, had work-up done and could not find any underlying problems.  She has been trying to eat small portions of meals at least 6 times a day.  She drinks a lot of fluids mostly.  Does have constipation however she has had it for a while and is currently on MiraLAX as well as Colace.  That does not seem to help.  Does have upcoming appointment with primary care provider and agrees to discuss it.  Patient has been making some progress with regards to her daily activities, has been getting out for short periods of time, staying outdoors and so on.  She was also able to assist her daughter with cleaning the room recently.  She however continues to have significant anxiety especially about her health as well as clinging onto her daughter as well as her spouse.  Patient often is worried about dying and that has been overwhelming for the family.  She does have upcoming appointment with the therapist to start cognitive behavioral therapy in August-Amber [name].  Patient also had endocrinology appointment recently, was started on methimazole for hyperthyroidism.  Currently takes Xanax for the anxiety which likely could be multifactorial, endocrinology also recommended to continue the same.  Currently takes 0.25 mg during the day and 0.5 mg at bedtime.  Denies any other  concerns today.      Visit Diagnosis:    ICD-10-CM   1. MDD (major depressive disorder), recurrent, severe, with psychosis ([place])  F33.3 busPIRone (BUSPAR) 5 MG tablet    FLUoxetine (PROZAC) 40 MG capsule    2. GAD (generalized anxiety disorder)  F41.1 busPIRone (BUSPAR) 5 MG tablet    FLUoxetine  (PROZAC) 40 MG capsule    3. Thyroid function test abnormal  R94.6 OLANZapine zydis (ZYPREXA) 5 MG disintegrating tablet      Past Psychiatric History: Reviewed past psychiatric history from progress note on 09/25/2021.  Past trials of medications like Lexapro, Celexa.  Patient was admitted to internal medicine service at [facility] for failure to thrive in January 2023-later on transferred to [facility] for ECT treatments in February 2023.  Past Medical History:  Past Medical History:  Diagnosis Date   ABLA (acute blood loss anemia) 06/19/2021   Anxiety    Aspiration pneumonia of right lung due to gastric secretions (HCC)    C. difficile colitis 07/13/2021   COVID-19 virus infection 06/08/2021    Hypertension    Kidney disease    Stroke [facility])    History reviewed. No pertinent surgical history.  Family Psychiatric History: Reviewed family psychiatric history from progress note on 09/25/2021.  Family History:  Family History  Problem Relation Age of Onset   High blood pressure Maternal Grandmother     Social History: Reviewed social history from progress note on 09/25/2021. Social History   Socioeconomic History   Marital status: Married    Spouse name: Not on file   Number of children: 5   Years of education: Not on file   Highest education level: Not on file  Occupational History   Not on file  Tobacco Use   Smoking status: Former    Types: Cigarettes   Smokeless tobacco: Never  Vaping Use   Vaping Use: Never used  Substance and Sexual Activity   Alcohol use: Not Currently   Drug use: Not Currently   Sexual activity: Not Currently  Other Topics Concern   Not on file  Social History Narrative   Right handed    one story home    lives with family    Doesn't work currently   Social Determinants of Radio broadcast assistant Strain: Not on file  Food Insecurity: Not on file  Transportation Needs: Not on file  Physical Activity: Not on file  Stress: Not on  file  Social Connections: Not on file    Allergies:  Allergies  Allergen Reactions   Shellfish Allergy     "I pass out"   Valium [Diazepam] Other (See Comments)    Makes her want to " fly"    Metabolic Disorder Labs: Lab Results  Component Value Date   HGBA1C 4.9 10/19/2021   Lab Results  Component Value Date   PROLACTIN 20.6 10/19/2021   Lab Results  Component Value Date   CHOL 162 10/19/2021   TRIG 102 10/19/2021   HDL 46 10/19/2021   CHOLHDL 3.5 10/19/2021   LDLCALC 97 10/19/2021   Lab Results  Component Value Date   TSH 0.022 (L) 08/30/2021   TSH <0.080 (L) 08/10/2021    Therapeutic Level Labs: No results found for: "LITHIUM" No results found for: "VALPROATE" No results found for: "CBMZ"  Current Medications: Current Outpatient Medications  Medication Sig Dispense Refill   ALPRAZolam (XANAX) 0.5 MG tablet Take 1 tablet (0.5 mg total) by mouth 2 (two) times daily  as needed for anxiety. (Patient taking differently: Take 0.25-0.5 mg by mouth 2 (two) times daily as needed for anxiety. 0.25 mg daily in the AM and 0.5 mg daily at bedtime) 60 tablet 1   apixaban (ELIQUIS) 5 MG TABS tablet Take 1 tablet (5 mg total) by mouth 2 (two) times daily. 60 tablet 3   busPIRone (BUSPAR) 5 MG tablet Take 1 tablet (5 mg total) by mouth in the morning. 30 tablet 1   docusate sodium (COLACE) 100 MG capsule Take 1 capsule by mouth 2 (two) times daily.     ferrous sulfate 325 (65 FE) MG tablet Take 1 tablet (325 mg total) by mouth daily with breakfast. 30 tablet 0   FLUoxetine (PROZAC) 40 MG capsule Take 1 capsule (40 mg total) by mouth daily with breakfast. 90 capsule 0   methimazole (TAPAZOLE) 5 MG tablet Take 1 tablet (5 mg total) by mouth daily. 30 tablet 3   metoprolol succinate (TOPROL-XL) 25 MG 24 hr tablet TAKE 1/2 TABLET BY MOUTH ONCE DAILY 45 tablet 0   mirtazapine (REMERON) 30 MG tablet Take 1 tablet (30 mg total) by mouth at bedtime. 90 tablet 1   Multiple Vitamin  (MULTIVITAMIN WITH MINERALS) TABS tablet Take 1 tablet by mouth daily. 30 tablet 0   ondansetron (ZOFRAN) 4 MG tablet Take 1 tablet (4 mg total) by mouth every 6 (six) hours as needed for nausea. 20 tablet 0   polyethylene glycol powder (GLYCOLAX/MIRALAX) 17 GM/SCOOP powder as needed.     sodium bicarbonate 650 MG tablet Take 1 tablet (650 mg total) by mouth 2 (two) times daily. 60 tablet 2   Vitamin D, Ergocalciferol, (DRISDOL) 1.25 MG (50000 UNIT) CAPS capsule Take 1 capsule (50,000 Units total) by mouth every 7 (seven) days. 5 capsule 0   acetaminophen (TYLENOL) 500 MG tablet Take 500 mg by mouth every 6 (six) hours as needed for moderate pain or mild pain.     OLANZapine zydis (ZYPREXA) 5 MG disintegrating tablet Take 1 tablet (5 mg total) by mouth at bedtime. 30 tablet 1   No current facility-administered medications for this visit.     Musculoskeletal: Strength & Muscle Tone:  uta Gait & Station:  walks with walker Patient leans: Front  Psychiatric Specialty Exam: Review of Systems  Psychiatric/Behavioral:  The patient is nervous/anxious.   All other systems reviewed and are negative.   There were no vitals taken for this visit.There is no height or weight on file to calculate BMI.  General Appearance: Casual  Eye Contact:  Minimal  Speech:  Clear and Coherent  Volume:  Normal  Mood:  Anxious  Affect:  Congruent  Thought Process:  Goal Directed and Descriptions of Associations: Intact  Orientation:  Full (Time, Place, and Person)  Thought Content: Logical   Suicidal Thoughts:  No  Homicidal Thoughts:  No  Memory:  Immediate;   Fair Recent;   Fair Remote;   Fair  Judgement:  Fair  Insight:  Fair  Psychomotor Activity:  Normal  Concentration:  Concentration: Fair and Attention Span: Fair  Recall:  [employer] of Knowledge: Fair  Language: Fair  Akathisia:  No  Handed:  Right  AIMS (if indicated): not done  Assets:  Communication Skills Desire for  Improvement Housing Social Support Transportation  ADL's:  Intact  Cognition: WNL  Sleep:  Fair   Screenings: [place] Office Visit from 10/29/2021 in [place] Office Visit from [facility]/25/2021 in  [facility]  AIMS Total Score 0 0      ECT-MADRS    Flowsheet Row Admission (Discharged) from 06/19/2021 in [place] Most recent reading at 07/09/2021  1:24 PM ECT Treatment from 06/25/2021 in [place] Most recent reading at 06/25/2021  3:22 PM  MADRS Total Score 13 [place] Office Visit from 03/29/2021 in [facility]  Total GAD-7 Score 11      PHQ2-9    [place] Visit from 10/29/2021 in [place] Office Visit from 08/16/2021 in [facility] Office Visit from 04/25/2021 in [place] Visit from 03/29/2021 in [facility]  PHQ-2 Total Score 5 5 0 5  PHQ-9 Total Score 17 16 0 15      [place] Visit from 10/29/2021 in [place] Visit from 09/25/2021 in [place] Admission (Discharged) from 06/19/2021 in [place] CATEGORY No Risk No Risk No Risk        Assessment and Plan: Amber [name] is a 76 year old Caucasian female who currently lives in [place] with her husband, has a history of MDD, anxiety disorder, completed ECT treatments for failure to thrive-February 2023, presented for medication management.  Patient is currently tolerating the Prozac, however continues to have anxiety and will benefit from medication management and psychotherapy sessions.  Plan MDD with psychosis-improving Olanzapine 5 mg at bedtime Mirtazapine 30 mg at bedtime. Taper of BuSpar, advised to take BuSpar 5 mg  p.o. daily. Increase Prozac to 40 mg p.o. daily in the morning with breakfast Referred for CBT-has upcoming appointment with therapist Amber [name] . [facility] - 09/25/2021- 27/30.  GAD-unstable Increase Prozac to 40 mg p.o. daily Taper off BuSpar as noted above Advised to limit the use of Xanax and to reduce it to Xanax 0.25 mg during the day and 0.25 mg at bedtime as needed Provided education about long-term use of benzodiazepine therapy.  Abnormal thyroid function-unstable Recently started on methimazole per endocrinology-reviewed notes per Dr.Nida dated 11/07/2021-patient was started on methimazole.'  Collateral information obtained from [name] as noted above.  Follow-up in clinic in 6 to 8 weeks or sooner if needed.   Collaboration of Care: Collaboration of Care: Referral or follow-up with counselor/therapist AEB encouraged to follow up with therapist.  Patient/Guardian was advised Release of Information must be obtained prior to any record release in order to collaborate their care with an outside provider. Patient/Guardian was advised if they have not already done so to contact the registration department to sign all necessary forms in order for [place] to release information regarding their care.   Consent: Patient/Guardian gives verbal consent for treatment and assignment of benefits for services provided during this visit. Patient/Guardian expressed understanding and agreed to proceed.   This note was generated in part or whole with voice recognition software. Voice recognition is usually quite accurate but there are transcription errors that can and very often do occur. I apologize for any typographical errors that were not detected and corrected.      Ursula Alert, MD 11/21/2021, 12:15 PM

## 2021-11-21 NOTE — Assessment & Plan Note (Signed)
Continue Colace 100 mg BID MiraLAX as needed 

## 2021-11-21 NOTE — Patient Instructions (Signed)
Please take Eliquis only for 1 month.  Please take Sodium bicarbonate only once daily.  Please continue taking other medications as prescribed.

## 2021-11-21 NOTE — Assessment & Plan Note (Signed)
Has had ECT during his recent hospitalization Continue Zyprexa, Remeron, Prozac and BuSpar Followed by psychiatry

## 2021-11-21 NOTE — Assessment & Plan Note (Signed)
Needs to maintain adequate fluid intake Will recheck CMP Continue vitamin D supplement Reduced sodium bicarb to 650 mg QD Avoid nephrotoxic agents

## 2021-11-21 NOTE — Assessment & Plan Note (Signed)
Uncontrolled with BuSpar and Xanax Continue Xanax to 0.5 mg twice daily as needed

## 2021-11-21 NOTE — Assessment & Plan Note (Signed)
Likely due to underlying anxiety Continue Toprol for now, plan to DC later

## 2021-11-21 NOTE — Assessment & Plan Note (Signed)
Continue Remeron Continue protein supplement Needs to avoid skipping meals and improve hydration

## 2021-11-21 NOTE — Assessment & Plan Note (Signed)
Lab Results  Component Value Date   PTH 165 (H) 05/04/2021   CALCIUM 10.2 10/23/2021   PHOS 3.0 06/29/2021   Has had borderline elevated calcium in the past Can explain psychosis, nephrolithiasis and tachycardia Referred to endocrinology

## 2021-11-21 NOTE — Progress Notes (Signed)
follow

## 2021-11-21 NOTE — Progress Notes (Signed)
Established Patient Office Visit  Subjective:  Patient ID: Amber [name], female    DOB: 05/19/1946  Age: 76 y.o. MRN: [id number]  CC:  Chief Complaint  Patient presents with   Follow-up    Follow up pt states she cant swallow pt had swallow test done at [facility] daughter states no trouble swallowing as she eats and drinks water her bm have been off she will have one and then go days without taking colace twice daily and miralax as needed bm is still hard     HPI Amber [name] is a 76 y.o. female with past medical history of aortic atherosclerosis, depression with anxiety, PE, hyperthyroidism and protein-calorie malnutrition who presents for f/u of her chronic medical conditions.  PE: She takes Eliquis for it.  Denies any chest pain, dyspnea or palpitations.  Denies any signs of active bleeding.  She had recent CT chest with contrast and PET scan done, which showed in inconclusive findings, but most likely due to recent PE and post-inflammatory changes.  Hyperthyroidism: She has started taking methimazole for it.  Followed by Dr. Dorris Fetch.  Her weight has been stable overall.  Her appetite has improved now.  Depression with anxiety: Followed by psychiatry now.  She is on Prozac, Zyprexa, Remeron and BuSpar currently.  Denies any SI or HI.  Constipation: She takes Colace twice daily, but has not been taking MiraLAX regularly.  She has to strain at times, but denies any melena or hematochezia.  Prefers to wait for GI referral for colonoscopy for now.  She also reports swallowing difficulty to pills, but has had normal swallow eval in the past.     Past Medical History:  Diagnosis Date   ABLA (acute blood loss anemia) 06/19/2021   Anxiety    Aspiration pneumonia of right lung due to gastric secretions (HCC)    C. difficile colitis 07/13/2021   COVID-19 virus infection 06/08/2021    Hypertension    Kidney disease    Stroke [facility])     History reviewed. No pertinent surgical  history.  Family History  Problem Relation Age of Onset   High blood pressure Maternal Grandmother     Social History   Socioeconomic History   Marital status: Married    Spouse name: Not on file   Number of children: 5   Years of education: Not on file   Highest education level: Not on file  Occupational History   Not on file  Tobacco Use   Smoking status: Former    Types: Cigarettes   Smokeless tobacco: Never  Vaping Use   Vaping Use: Never used  Substance and Sexual Activity   Alcohol use: Not Currently   Drug use: Not Currently   Sexual activity: Not Currently  Other Topics Concern   Not on file  Social History Narrative   Right handed    one story home    lives with family    Doesn't work currently   Social Determinants of Radio broadcast assistant Strain: Not on file  Food Insecurity: Not on file  Transportation Needs: Not on file  Physical Activity: Not on file  Stress: Not on file  Social Connections: Not on file  Intimate Partner Violence: Not on file    Outpatient Medications Prior to Visit  Medication Sig Dispense Refill   acetaminophen (TYLENOL) 500 MG tablet Take 500 mg by mouth every 6 (six) hours as needed for moderate pain or mild pain.  ALPRAZolam (XANAX) 0.5 MG tablet Take 1 tablet (0.5 mg total) by mouth 2 (two) times daily as needed for anxiety. (Patient taking differently: Take 0.25-0.5 mg by mouth 2 (two) times daily as needed for anxiety. 0.25 mg daily in the AM and 0.5 mg daily at bedtime) 60 tablet 1   apixaban (ELIQUIS) 5 MG TABS tablet Take 1 tablet (5 mg total) by mouth 2 (two) times daily. 60 tablet 3   busPIRone (BUSPAR) 5 MG tablet Take 1 tablet (5 mg total) by mouth in the morning. 30 tablet 1   docusate sodium (COLACE) 100 MG capsule Take 1 capsule by mouth 2 (two) times daily.     ferrous sulfate 325 (65 FE) MG tablet Take 1 tablet (325 mg total) by mouth daily with breakfast. 30 tablet 0   FLUoxetine (PROZAC) 40 MG capsule  Take 1 capsule (40 mg total) by mouth daily with breakfast. 90 capsule 0   methimazole (TAPAZOLE) 5 MG tablet Take 1 tablet (5 mg total) by mouth daily. 30 tablet 3   metoprolol succinate (TOPROL-XL) 25 MG 24 hr tablet TAKE 1/2 TABLET BY MOUTH ONCE DAILY 45 tablet 0   mirtazapine (REMERON) 30 MG tablet Take 1 tablet (30 mg total) by mouth at bedtime. 90 tablet 1   Multiple Vitamin (MULTIVITAMIN WITH MINERALS) TABS tablet Take 1 tablet by mouth daily. 30 tablet 0   OLANZapine zydis (ZYPREXA) 5 MG disintegrating tablet Take 1 tablet (5 mg total) by mouth at bedtime. 30 tablet 1   ondansetron (ZOFRAN) 4 MG tablet Take 1 tablet (4 mg total) by mouth every 6 (six) hours as needed for nausea. 20 tablet 0   polyethylene glycol powder (GLYCOLAX/MIRALAX) 17 GM/SCOOP powder as needed.     Vitamin D, Ergocalciferol, (DRISDOL) 1.25 MG (50000 UNIT) CAPS capsule Take 1 capsule (50,000 Units total) by mouth every 7 (seven) days. 5 capsule 0   sodium bicarbonate 650 MG tablet Take 1 tablet (650 mg total) by mouth 2 (two) times daily. 60 tablet 2   No facility-administered medications prior to visit.    Allergies  Allergen Reactions   Shellfish Allergy     "I pass out"   Valium [Diazepam] Other (See Comments)    Makes her want to " fly"    ROS Review of Systems  Constitutional:  Positive for fatigue and unexpected weight change. Negative for chills and fever.  HENT:  Negative for congestion, sinus pressure, sinus pain and sore throat.   Eyes:  Negative for pain and discharge.  Respiratory:  Negative for cough and shortness of breath.   Cardiovascular:  Negative for chest pain and palpitations.  Gastrointestinal:  Positive for constipation. Negative for abdominal pain, diarrhea, nausea and vomiting.  Endocrine: Negative for polydipsia and polyuria.  Genitourinary:  Negative for dysuria and hematuria.  Musculoskeletal:  Positive for gait problem and myalgias. Negative for neck pain and neck stiffness.   Skin:  Negative for rash.  Neurological:  Positive for tremors, weakness and numbness. Negative for dizziness.  Psychiatric/Behavioral:  Positive for agitation, behavioral problems, decreased concentration, dysphoric mood and sleep disturbance. Negative for suicidal ideas. The patient is nervous/anxious.       Objective:    Physical Exam Vitals reviewed.  Constitutional:      General: She is not in acute distress.    Appearance: She is not diaphoretic.     Comments: Has rolling walker  HENT:     Head: Normocephalic and atraumatic.     Nose: Nose  normal.     Mouth/Throat:     Mouth: Mucous membranes are moist.  Eyes:     General: No scleral icterus.    Extraocular Movements: Extraocular movements intact.  Cardiovascular:     Rate and Rhythm: Normal rate and regular rhythm.     Pulses: Normal pulses.     Heart sounds: Normal heart sounds. No murmur heard. Pulmonary:     Breath sounds: Normal breath sounds. No wheezing or rales.  Musculoskeletal:     Cervical back: Neck supple. No tenderness.     Right lower leg: No edema.     Left lower leg: No edema.  Skin:    General: Skin is warm.     Findings: No rash.  Neurological:     General: No focal deficit present.     Mental Status: She is alert and oriented to person, place, and time.     Motor: Weakness (3/5 in b/l UE and LE) present.     Gait: Gait abnormal (Slow, unsteady gait).  Psychiatric:        Mood and Affect: Mood normal.        Behavior: Behavior normal.     BP 136/88 (BP Location: Left Arm, Patient Position: Sitting, Cuff Size: Normal)   Pulse 62   Resp 18   Wt 132 lb 3.2 oz (60 kg)   SpO2 98%   BMI 23.42 kg/m  Wt Readings from Last 3 Encounters:  11/21/21 132 lb 3.2 oz (60 kg)  11/07/21 128 lb (58.1 kg)  10/23/21 132 lb 3.2 oz (60 kg)    Lab Results  Component Value Date   TSH 0.022 (L) 08/30/2021   Lab Results  Component Value Date   WBC 4.7 10/23/2021   HGB 9.8 (L) 10/23/2021   HCT 30.3  (L) 10/23/2021   MCV 85.6 10/23/2021   PLT 192 10/23/2021   Lab Results  Component Value Date   NA 141 10/23/2021   K 3.3 (L) 10/23/2021   CO2 29 10/23/2021   GLUCOSE 100 (H) 10/23/2021   BUN 15 10/23/2021   CREATININE 1.25 (H) 10/23/2021   BILITOT 0.4 10/23/2021   ALKPHOS 58 10/23/2021   AST 9 (L) 10/23/2021   ALT 6 10/23/2021   PROT 6.0 (L) 10/23/2021   ALBUMIN 3.2 (L) 10/23/2021   CALCIUM 10.2 10/23/2021   ANIONGAP 4 (L) 10/23/2021   EGFR 23 (L) 03/29/2021   Lab Results  Component Value Date   CHOL 162 10/19/2021   Lab Results  Component Value Date   HDL 46 10/19/2021   Lab Results  Component Value Date   LDLCALC 97 10/19/2021   Lab Results  Component Value Date   TRIG 102 10/19/2021   Lab Results  Component Value Date   CHOLHDL 3.5 10/19/2021   Lab Results  Component Value Date   HGBA1C 4.9 10/19/2021      Assessment & Plan:   Problem List Items Addressed This Visit       Cardiovascular and Mediastinum   Pulmonary embolus ([place])    On Eliquis currently, continue for 1 more month Followed by heme-onc. for anemia, denies any active bleeding CT chest and PET scan showed opacity, could be related to infiltrate versus PE        Digestive   Chronic idiopathic constipation    Continue Colace 100 mg BID MiraLAX as needed        Endocrine   Primary hyperparathyroidism ([place])    Lab Results  Component Value  Date   PTH 165 (H) 05/04/2021   CALCIUM 10.2 10/23/2021   PHOS 3.0 06/29/2021  Has had borderline elevated calcium in the past Can explain psychosis, nephrolithiasis and tachycardia Referred to endocrinology      Relevant Orders   Parathyroid hormone, intact (no Ca)     Genitourinary   Stage 3a chronic kidney disease ([place]) - Primary    Needs to maintain adequate fluid intake Will recheck CMP Continue vitamin D supplement Reduced sodium bicarb to 650 mg QD Avoid nephrotoxic agents      Relevant Medications   sodium bicarbonate 650  MG tablet   Other Relevant Orders   CBC with Differential/Platelet   Basic Metabolic Panel (BMET)     Other   Moderate protein-calorie malnutrition ([place])    Continue Remeron Continue protein supplement Needs to avoid skipping meals and improve hydration      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    Dispense:  30 tablet    Refill:  2    Follow-up: Return in about 4 months (around 03/23/2022).    Lindell Spar, MD

## 2021-11-21 NOTE — Assessment & Plan Note (Signed)
On Eliquis currently, continue for 1 more month Followed by heme-onc. for anemia, denies any active bleeding CT chest and PET scan showed opacity, could be related to infiltrate versus PE

## 2021-12-04 ENCOUNTER — Telehealth: Payer: Self-pay

## 2021-12-04 NOTE — Telephone Encounter (Signed)
Please contact and schedule a sooner appointment . Thank you .

## 2021-12-04 NOTE — Telephone Encounter (Signed)
pt daughter called left message that the prozac is not working and there is no improvment.  in behavior.

## 2021-12-07 NOTE — Telephone Encounter (Signed)
Scott made appt for 12-10-21

## 2021-12-10 ENCOUNTER — Telehealth (INDEPENDENT_AMBULATORY_CARE_PROVIDER_SITE_OTHER): Payer: Medicare Other | Admitting: Psychiatry

## 2021-12-10 ENCOUNTER — Encounter: Payer: Self-pay | Admitting: Psychiatry

## 2021-12-10 DIAGNOSIS — F411 Generalized anxiety disorder: Secondary | ICD-10-CM | POA: Diagnosis not present

## 2021-12-10 DIAGNOSIS — F333 Major depressive disorder, recurrent, severe with psychotic symptoms: Secondary | ICD-10-CM | POA: Diagnosis not present

## 2021-12-10 DIAGNOSIS — Z9189 Other specified personal risk factors, not elsewhere classified: Secondary | ICD-10-CM | POA: Diagnosis not present

## 2021-12-10 MED ORDER — OLANZAPINE 7.5 MG PO TABS: 7.5000 mg | ORAL_TABLET | Freq: Every day | ORAL | 1 refills | Status: DC

## 2021-12-10 MED ORDER — MIRTAZAPINE 15 MG PO TABS: 15.0000 mg | ORAL_TABLET | Freq: Every day | ORAL | 0 refills | Status: DC

## 2021-12-10 NOTE — Patient Instructions (Signed)
Please call for [phone] 

## 2021-12-10 NOTE — Progress Notes (Unsigned)
Virtual Visit via Video Note  I connected with Amber [name] on 12/10/21 at 11:30 AM EDT by a video enabled telemedicine application and verified that I am speaking with the correct person using two identifiers.  Location Provider Location : [facility] Patient Location : Home  Participants: Patient ,Daughter, Provider   I discussed the limitations of evaluation and management by telemedicine and the availability of in person appointments. The patient expressed understanding and agreed to proceed.    I discussed the assessment and treatment plan with the patient. The patient was provided an opportunity to ask questions and all were answered. The patient agreed with the plan and demonstrated an understanding of the instructions.   The patient was advised to call back or seek an in-person evaluation if the symptoms worsen or if the condition fails to improve as anticipated.   [facility] MD OP Progress Note  12/10/2021 12:21 PM Amber [name]  MRN:  [id number]  Chief Complaint:  Chief Complaint  Patient presents with   Follow-up: 76 year old Caucasian female with history of depression, anxiety, thyroid abnormalities, multiple medical problems, presented for worsening anxiety and regressive behavior.   HPI: Amber [name] is a 76 year old Caucasian female, married, lives in [place], has a history of MDD, GAD, hypothyroidism, chronic kidney disease, secondary hyperparathyroidism was evaluated by telemedicine today.  Patient as well as [name] participated in the evaluation.  Daughter provided collateral information since patient is a limited historian.  Patient today appeared to be alert, oriented to person and place situation.  Reports she is having good days and bad days and may be more bad days.  Patient also reports sleep problems.  As per daughter patient is getting worse.  She does not do anything by herself, constantly calling for her dad and herself to come and check on her since she is  worried about dying alone.  Patient does not want to do anything, does not want to watch TV, read a book or doing any kind of activities.  Constantly anxious, decreased attention span and seems to be depressed.  Reports sleep also as poor since the past few nights.  Current medications are not beneficial.  They have not been able to establish care with a therapist however has upcoming appointment scheduled in August.  That is the earliest we could get.  Does report swallowing difficulty although she is able to swallow her food and recent swallowing test blood GI came back within normal limits.  Currently compliant on her medications.  Limiting the amount of Xanax.  Currently does report dry mouth likely from her psychotropics and other medications.  Currently following up with endocrinologist for her hyperthyroidism.  On methimazole.      Visit Diagnosis:    ICD-10-CM   1. MDD (major depressive disorder), recurrent, severe, with psychosis ([place])  F33.3 OLANZapine (ZYPREXA) 7.5 MG tablet    mirtazapine (REMERON) 15 MG tablet    2. GAD (generalized anxiety disorder)  F41.1 mirtazapine (REMERON) 15 MG tablet    3. At risk for prolonged QT interval syndrome  Z91.89 EKG 12-Lead      Past Psychiatric History: Reviewed past psychiatric history from progress note on 09/25/2021.  Patient was admitted to internal medicine service at [facility] for failure to thrive in January 2023-later transferred to [facility] for ECT treatment in February 2023.  Past Medical History:  Past Medical History:  Diagnosis Date   ABLA (acute blood loss anemia) 06/19/2021   Anxiety    Aspiration pneumonia  of right lung due to gastric secretions (HCC)    C. difficile colitis 07/13/2021   COVID-19 virus infection 06/08/2021    Hypertension    Kidney disease    Stroke [facility])    History reviewed. No pertinent surgical history.  Family Psychiatric History: Reviewed family psychiatric history from progress note on  09/25/2021.  Family History:  Family History  Problem Relation Age of Onset   High blood pressure Maternal Grandmother     Social History: Reviewed social history from progress note on 09/25/2021. Social History   Socioeconomic History   Marital status: Married    Spouse name: Not on file   Number of children: 5   Years of education: Not on file   Highest education level: Not on file  Occupational History   Not on file  Tobacco Use   Smoking status: Former    Types: Cigarettes   Smokeless tobacco: Never  Vaping Use   Vaping Use: Never used  Substance and Sexual Activity   Alcohol use: Not Currently   Drug use: Not Currently   Sexual activity: Not Currently  Other Topics Concern   Not on file  Social History Narrative   Right handed    one story home    lives with family    Doesn't work currently   Social Determinants of Radio broadcast assistant Strain: Not on file  Food Insecurity: Not on file  Transportation Needs: Not on file  Physical Activity: Not on file  Stress: Not on file  Social Connections: Not on file    Allergies:  Allergies  Allergen Reactions   Shellfish Allergy     "I pass out"   Valium [Diazepam] Other (See Comments)    Makes her want to " fly"    Metabolic Disorder Labs: Lab Results  Component Value Date   HGBA1C 4.9 10/19/2021   Lab Results  Component Value Date   PROLACTIN 20.6 10/19/2021   Lab Results  Component Value Date   CHOL 162 10/19/2021   TRIG 102 10/19/2021   HDL 46 10/19/2021   CHOLHDL 3.5 10/19/2021   LDLCALC 97 10/19/2021   Lab Results  Component Value Date   TSH 0.022 (L) 08/30/2021   TSH <0.080 (L) 08/10/2021    Therapeutic Level Labs: No results found for: "LITHIUM" No results found for: "VALPROATE" No results found for: "CBMZ"  Current Medications: Current Outpatient Medications  Medication Sig Dispense Refill   acetaminophen (TYLENOL) 500 MG tablet Take 500 mg by mouth every 6 (six) hours as  needed for moderate pain or mild pain.     ALPRAZolam (XANAX) 0.5 MG tablet Take 1 tablet (0.5 mg total) by mouth 2 (two) times daily as needed for anxiety. (Patient taking differently: Take 0.25 mg by mouth 2 (two) times daily as needed for anxiety. 0.25 mg daily as needed) 60 tablet 1   apixaban (ELIQUIS) 5 MG TABS tablet Take 1 tablet (5 mg total) by mouth 2 (two) times daily. 60 tablet 3   busPIRone (BUSPAR) 5 MG tablet Take 1 tablet (5 mg total) by mouth in the morning. 30 tablet 1   docusate sodium (COLACE) 100 MG capsule Take 1 capsule by mouth 2 (two) times daily.     ferrous sulfate 325 (65 FE) MG tablet Take 1 tablet (325 mg total) by mouth daily with breakfast. 30 tablet 0   FLUoxetine (PROZAC) 40 MG capsule Take 1 capsule (40 mg total) by mouth daily with breakfast. 90 capsule 0  methimazole (TAPAZOLE) 5 MG tablet Take 1 tablet (5 mg total) by mouth daily. 30 tablet 3   metoprolol succinate (TOPROL-XL) 25 MG 24 hr tablet TAKE 1/2 TABLET BY MOUTH ONCE DAILY 45 tablet 0   mirtazapine (REMERON) 15 MG tablet Take 1 tablet (15 mg total) by mouth at bedtime. 90 tablet 0   Multiple Vitamin (MULTIVITAMIN WITH MINERALS) TABS tablet Take 1 tablet by mouth daily. 30 tablet 0   OLANZapine (ZYPREXA) 7.5 MG tablet Take 1 tablet (7.5 mg total) by mouth at bedtime. 30 tablet 1   ondansetron (ZOFRAN) 4 MG tablet Take 1 tablet (4 mg total) by mouth every 6 (six) hours as needed for nausea. 20 tablet 0   polyethylene glycol powder (GLYCOLAX/MIRALAX) 17 GM/SCOOP powder as needed.     sodium bicarbonate 650 MG tablet Take 1 tablet (650 mg total) by mouth daily. 30 tablet 2   Vitamin D, Ergocalciferol, (DRISDOL) 1.25 MG (50000 UNIT) CAPS capsule Take 1 capsule (50,000 Units total) by mouth every 7 (seven) days. 5 capsule 0   No current facility-administered medications for this visit.     Musculoskeletal: Strength & Muscle Tone:  UTA Gait & Station:  Walks with walker Patient leans:  Front  Psychiatric Specialty Exam: Review of Systems  Unable to perform ROS: Acuity of condition    There were no vitals taken for this visit.There is no height or weight on file to calculate BMI.  General Appearance: Casual  Eye Contact:  Minimal  Speech:  Normal Rate  Volume:  Decreased  Mood:  Anxious and Depressed  Affect:  Congruent  Thought Process:  Goal Directed and Descriptions of Associations: Intact  Orientation:  Other:  person, place, situation  Thought Content: Logical   Suicidal Thoughts:  No  Homicidal Thoughts:  No  Memory:  Immediate;   Fair Recent;   limited Remote;   limited  Judgement:  Fair  Insight:  Shallow  Psychomotor Activity:  Normal  Concentration:  Concentration: Poor and Attention Span: Poor  Recall:  Poor  Fund of Knowledge: Fair  Language: Fair  Akathisia:  No  Handed:  Right  AIMS (if indicated): not done  Assets:  Desire for Improvement Housing Social Support Transportation  ADL's:  Intact  Cognition: baseline  Sleep:  Poor   Screenings: [place] Office Visit from 10/29/2021 in [place] Office Visit from 09/25/2021 in [place] Total Score 0 0      ECT-MADRS    [place] Admission (Discharged) from 06/19/2021 in [place] Most recent reading at 07/09/2021  1:24 PM ECT Treatment from 06/25/2021 in [place] Most recent reading at 06/25/2021  3:22 PM  MADRS Total Score 13 [place] Office Visit from 03/29/2021 in [facility]  Total GAD-7 Score 11      PHQ2-9    [place] Visit from 11/21/2021 in [facility] Office Visit from 10/29/2021 in [place] Visit from 08/16/2021 in [place] Visit from 04/25/2021 in [place] Visit from 03/29/2021  in [facility]  PHQ-2 Total Score 0 5 5 0 5  PHQ-9 Total Score -- 17 16 0 15      [place] Visit from 10/29/2021 in [place] Visit from 09/25/2021 in [facility]mission (Discharged) from 06/19/2021 in [place] [facility] MEDICAL TELEMETRY  C-SSRS RISK CATEGORY No Risk No Risk No Risk        Assessment and Plan: WINDI TORO is a 76 year old Caucasian female who currently lives in [place], has a history of MDD, anxiety disorder, completed ECT treatments for failure to thrive-February 2023, presented for medication management.  Patient with worsening depression, anxiety and sleep problems will benefit from the following plan.  Plan  MDD with psychosis-unstable Increase olanzapine to 7.5 mg at bedtime Reduce mirtazapine to 15 mg at bedtime Taper off BuSpar, BuSpar 5 mg p.o. daily for now Prozac 40 mg p.o. daily in the morning with breakfast-recently increased Patient was referred for CBT-has upcoming appointment with therapist Ms. Amy Arnoldo Morale.  GAD-unstable Prozac 40 mg p.o. daily Currently taking limited amount of Xanax-tapering it down-0.25 mg daily as needed Continue Prozac 40 mg p.o. daily Referred for CBT.  At risk for prolonged QT syndrome-we will order EKG since olanzapine dosage is being increased.  Provided phone #[phone].  Discussed intensive outpatient program, [facility] program as well as inpatient admission if she does not make any progress.  Collateral information obtained from daughter as noted above.  Discussed behavioral strategies, setting up a schedule, having more structure in her day as well as setting boundaries.  Follow-up in clinic in 3 to 4 weeks or sooner if needed.  Collaboration of Care: Collaboration of Care: Referral or follow-up with counselor/therapist AEB encouraged to establish care with a psychotherapist.  Patient/Guardian was advised  Release of Information must be obtained prior to any record release in order to collaborate their care with an outside provider. Patient/Guardian was advised if they have not already done so to contact the registration department to sign all necessary forms in order for [place] to release information regarding their care.   Consent: Patient/Guardian gives verbal consent for treatment and assignment of benefits for services provided during this visit. Patient/Guardian expressed understanding and agreed to proceed.   This note was generated in part or whole with voice recognition software. Voice recognition is usually quite accurate but there are transcription errors that can and very often do occur. I apologize for any typographical errors that were not detected and corrected.      Ursula Alert, MD 12/10/2021, 12:21 PM

## 2021-12-12 ENCOUNTER — Inpatient Hospital Stay
Admission: EM | Admit: 2021-12-12 | Discharge: 2021-12-17 | DRG: 659 | Disposition: A | Discharge status: Home Health | Payer: Medicare Other | Attending: Internal Medicine | Admitting: Internal Medicine

## 2021-12-12 ENCOUNTER — Emergency Department: Payer: Medicare Other

## 2021-12-12 ENCOUNTER — Observation Stay: Payer: Medicare Other | Admitting: Certified Registered"

## 2021-12-12 ENCOUNTER — Other Ambulatory Visit: Payer: Self-pay

## 2021-12-12 ENCOUNTER — Encounter
Admission: EM | Disposition: A | Discharge status: Home Health | Payer: Self-pay | Source: Home / Self Care | Attending: Internal Medicine

## 2021-12-12 DIAGNOSIS — N132 Hydronephrosis with renal and ureteral calculous obstruction: Secondary | ICD-10-CM

## 2021-12-12 DIAGNOSIS — I712 Thoracic aortic aneurysm, without rupture, unspecified: Secondary | ICD-10-CM | POA: Diagnosis present

## 2021-12-12 DIAGNOSIS — E876 Hypokalemia: Secondary | ICD-10-CM

## 2021-12-12 DIAGNOSIS — R319 Hematuria, unspecified: Secondary | ICD-10-CM

## 2021-12-12 DIAGNOSIS — D649 Anemia, unspecified: Secondary | ICD-10-CM | POA: Diagnosis present

## 2021-12-12 DIAGNOSIS — Z20822 Contact with and (suspected) exposure to covid-19: Secondary | ICD-10-CM | POA: Diagnosis present

## 2021-12-12 DIAGNOSIS — R54 Age-related physical debility: Secondary | ICD-10-CM | POA: Diagnosis present

## 2021-12-12 DIAGNOSIS — N3001 Acute cystitis with hematuria: Secondary | ICD-10-CM

## 2021-12-12 DIAGNOSIS — I2510 Atherosclerotic heart disease of native coronary artery without angina pectoris: Secondary | ICD-10-CM | POA: Diagnosis present

## 2021-12-12 DIAGNOSIS — N12 Tubulo-interstitial nephritis, not specified as acute or chronic: Secondary | ICD-10-CM

## 2021-12-12 DIAGNOSIS — Z8249 Family history of ischemic heart disease and other diseases of the circulatory system: Secondary | ICD-10-CM

## 2021-12-12 DIAGNOSIS — Z66 Do not resuscitate: Secondary | ICD-10-CM | POA: Diagnosis present

## 2021-12-12 DIAGNOSIS — I1310 Hypertensive heart and chronic kidney disease without heart failure, with stage 1 through stage 4 chronic kidney disease, or unspecified chronic kidney disease: Secondary | ICD-10-CM | POA: Diagnosis present

## 2021-12-12 DIAGNOSIS — Z6821 Body mass index (BMI) 21.0-21.9, adult: Secondary | ICD-10-CM

## 2021-12-12 DIAGNOSIS — Z8673 Personal history of transient ischemic attack (TIA), and cerebral infarction without residual deficits: Secondary | ICD-10-CM

## 2021-12-12 DIAGNOSIS — I700 Atherosclerosis of aorta: Secondary | ICD-10-CM | POA: Diagnosis present

## 2021-12-12 DIAGNOSIS — E0590 Thyrotoxicosis, unspecified without thyrotoxic crisis or storm: Secondary | ICD-10-CM | POA: Diagnosis present

## 2021-12-12 DIAGNOSIS — N1330 Unspecified hydronephrosis: Secondary | ICD-10-CM | POA: Diagnosis not present

## 2021-12-12 DIAGNOSIS — I825Y9 Chronic embolism and thrombosis of unspecified deep veins of unspecified proximal lower extremity: Secondary | ICD-10-CM | POA: Diagnosis not present

## 2021-12-12 DIAGNOSIS — Z7901 Long term (current) use of anticoagulants: Secondary | ICD-10-CM

## 2021-12-12 DIAGNOSIS — K219 Gastro-esophageal reflux disease without esophagitis: Secondary | ICD-10-CM | POA: Diagnosis present

## 2021-12-12 DIAGNOSIS — Y92003 Bedroom of unspecified non-institutional (private) residence as the place of occurrence of the external cause: Secondary | ICD-10-CM

## 2021-12-12 DIAGNOSIS — W06XXXA Fall from bed, initial encounter: Secondary | ICD-10-CM | POA: Diagnosis present

## 2021-12-12 DIAGNOSIS — Z79899 Other long term (current) drug therapy: Secondary | ICD-10-CM

## 2021-12-12 DIAGNOSIS — I82509 Chronic embolism and thrombosis of unspecified deep veins of unspecified lower extremity: Secondary | ICD-10-CM | POA: Diagnosis present

## 2021-12-12 DIAGNOSIS — N390 Urinary tract infection, site not specified: Secondary | ICD-10-CM | POA: Diagnosis present

## 2021-12-12 DIAGNOSIS — R110 Nausea: Secondary | ICD-10-CM | POA: Diagnosis present

## 2021-12-12 DIAGNOSIS — E43 Unspecified severe protein-calorie malnutrition: Secondary | ICD-10-CM | POA: Insufficient documentation

## 2021-12-12 DIAGNOSIS — N136 Pyonephrosis: Secondary | ICD-10-CM | POA: Diagnosis not present

## 2021-12-12 DIAGNOSIS — R778 Other specified abnormalities of plasma proteins: Secondary | ICD-10-CM

## 2021-12-12 DIAGNOSIS — B965 Pseudomonas (aeruginosa) (mallei) (pseudomallei) as the cause of diseases classified elsewhere: Secondary | ICD-10-CM | POA: Diagnosis present

## 2021-12-12 DIAGNOSIS — Z91013 Allergy to seafood: Secondary | ICD-10-CM

## 2021-12-12 DIAGNOSIS — N201 Calculus of ureter: Secondary | ICD-10-CM

## 2021-12-12 DIAGNOSIS — Z8616 Personal history of COVID-19: Secondary | ICD-10-CM

## 2021-12-12 DIAGNOSIS — F418 Other specified anxiety disorders: Secondary | ICD-10-CM | POA: Diagnosis not present

## 2021-12-12 DIAGNOSIS — I447 Left bundle-branch block, unspecified: Secondary | ICD-10-CM | POA: Diagnosis present

## 2021-12-12 DIAGNOSIS — Z888 Allergy status to other drugs, medicaments and biological substances status: Secondary | ICD-10-CM

## 2021-12-12 DIAGNOSIS — E039 Hypothyroidism, unspecified: Secondary | ICD-10-CM | POA: Diagnosis present

## 2021-12-12 DIAGNOSIS — D631 Anemia in chronic kidney disease: Secondary | ICD-10-CM | POA: Diagnosis present

## 2021-12-12 DIAGNOSIS — R32 Unspecified urinary incontinence: Secondary | ICD-10-CM | POA: Diagnosis present

## 2021-12-12 DIAGNOSIS — R627 Adult failure to thrive: Secondary | ICD-10-CM | POA: Diagnosis present

## 2021-12-12 DIAGNOSIS — R531 Weakness: Secondary | ICD-10-CM

## 2021-12-12 DIAGNOSIS — Z87891 Personal history of nicotine dependence: Secondary | ICD-10-CM

## 2021-12-12 DIAGNOSIS — Z86711 Personal history of pulmonary embolism: Secondary | ICD-10-CM

## 2021-12-12 DIAGNOSIS — F419 Anxiety disorder, unspecified: Secondary | ICD-10-CM | POA: Diagnosis present

## 2021-12-12 DIAGNOSIS — N1831 Chronic kidney disease, stage 3a: Secondary | ICD-10-CM | POA: Diagnosis present

## 2021-12-12 DIAGNOSIS — N200 Calculus of kidney: Secondary | ICD-10-CM

## 2021-12-12 DIAGNOSIS — Z86718 Personal history of other venous thrombosis and embolism: Secondary | ICD-10-CM

## 2021-12-12 DIAGNOSIS — F32A Depression, unspecified: Secondary | ICD-10-CM | POA: Diagnosis present

## 2021-12-12 DIAGNOSIS — E213 Hyperparathyroidism, unspecified: Secondary | ICD-10-CM | POA: Diagnosis present

## 2021-12-12 HISTORY — DX: Thyrotoxicosis, unspecified without thyrotoxic crisis or storm: E05.90

## 2021-12-12 HISTORY — PX: CYSTOSCOPY W/ URETERAL STENT PLACEMENT: SHX1429

## 2021-12-12 HISTORY — DX: Depression, unspecified: F32.A

## 2021-12-12 LAB — CBC WITH DIFFERENTIAL/PLATELET
Abs Immature Granulocytes: 0.08 10*3/uL — ABNORMAL HIGH (ref 0.00–0.07)
Basophils Absolute: 0 10*3/uL (ref 0.0–0.1)
Basophils Relative: 0 %
Eosinophils Absolute: 0 10*3/uL (ref 0.0–0.5)
Eosinophils Relative: 0 %
HCT: 29.9 % — ABNORMAL LOW (ref 36.0–46.0)
Hemoglobin: 9.3 g/dL — ABNORMAL LOW (ref 12.0–15.0)
Immature Granulocytes: 1 %
Lymphocytes Relative: 4 %
Lymphs Abs: 0.5 10*3/uL — ABNORMAL LOW (ref 0.7–4.0)
MCH: 26.6 pg (ref 26.0–34.0)
MCHC: 31.1 g/dL (ref 30.0–36.0)
MCV: 85.4 fL (ref 80.0–100.0)
Monocytes Absolute: 0.5 10*3/uL (ref 0.1–1.0)
Monocytes Relative: 4 %
Neutro Abs: 12.3 10*3/uL — ABNORMAL HIGH (ref 1.7–7.7)
Neutrophils Relative %: 91 %
Platelets: 295 10*3/uL (ref 150–400)
RBC: 3.5 MIL/uL — ABNORMAL LOW (ref 3.87–5.11)
RDW: 13.5 % (ref 11.5–15.5)
WBC: 13.4 10*3/uL — ABNORMAL HIGH (ref 4.0–10.5)
nRBC: 0 % (ref 0.0–0.2)

## 2021-12-12 LAB — TSH: TSH: 0.06 u[IU]/mL — ABNORMAL LOW (ref 0.350–4.500)

## 2021-12-12 LAB — PROCALCITONIN: Procalcitonin: 0.12 ng/mL

## 2021-12-12 LAB — SARS CORONAVIRUS 2 BY RT PCR: SARS Coronavirus 2 by RT PCR: NEGATIVE

## 2021-12-12 LAB — URINALYSIS, COMPLETE (UACMP) WITH MICROSCOPIC
Bilirubin Urine: NEGATIVE
Glucose, UA: NEGATIVE mg/dL
Ketones, ur: NEGATIVE mg/dL
Nitrite: POSITIVE — AB
Protein, ur: 100 mg/dL — AB
RBC / HPF: >50 RBC/hpf — ABNORMAL HIGH (ref 0–5)
Specific Gravity, Urine: 1.014 (ref 1.005–1.030)
Squamous Epithelial / HPF: NONE SEEN (ref 0–5)
WBC, UA: >50 WBC/hpf — ABNORMAL HIGH (ref 0–5)
pH: 6 (ref 5.0–8.0)

## 2021-12-12 LAB — COMPREHENSIVE METABOLIC PANEL
ALT: 9 U/L (ref 0–44)
AST: 12 U/L — ABNORMAL LOW (ref 15–41)
Albumin: 2.8 g/dL — ABNORMAL LOW (ref 3.5–5.0)
Alkaline Phosphatase: 58 U/L (ref 38–126)
Anion gap: 7 (ref 5–15)
BUN: 24 mg/dL — ABNORMAL HIGH (ref 8–23)
CO2: 24 mmol/L (ref 22–32)
Calcium: 9.8 mg/dL (ref 8.9–10.3)
Chloride: 109 mmol/L (ref 98–111)
Creatinine, Ser: 1.28 mg/dL — ABNORMAL HIGH (ref 0.44–1.00)
GFR, Estimated: 44 mL/min — ABNORMAL LOW (ref >60)
Glucose, Bld: 142 mg/dL — ABNORMAL HIGH (ref 70–99)
Potassium: 2.9 mmol/L — ABNORMAL LOW (ref 3.5–5.1)
Sodium: 140 mmol/L (ref 135–145)
Total Bilirubin: 0.9 mg/dL (ref 0.3–1.2)
Total Protein: 6.6 g/dL (ref 6.5–8.1)

## 2021-12-12 LAB — AMMONIA: Ammonia: 17 umol/L (ref 9–35)

## 2021-12-12 LAB — TROPONIN I (HIGH SENSITIVITY): Troponin I (High Sensitivity): 41 ng/L — ABNORMAL HIGH (ref <18)

## 2021-12-12 LAB — T4, FREE: Free T4: 1.16 ng/dL — ABNORMAL HIGH (ref 0.61–1.12)

## 2021-12-12 LAB — MAGNESIUM: Magnesium: 1.9 mg/dL (ref 1.7–2.4)

## 2021-12-12 SURGERY — CYSTOSCOPY WITH RETROGRADE PYELOGRAM/URETERAL STENT PLACEMENT
Anesthesia: General | Laterality: Right

## 2021-12-12 MED ORDER — IOHEXOL 300 MG/ML  SOLN
75.0000 mL | Freq: Once | INTRAMUSCULAR | Status: AC | PRN
  Administered 2021-12-12: 75 mL via INTRAVENOUS

## 2021-12-12 MED ORDER — LACTATED RINGERS IV BOLUS
1000.0000 mL | Freq: Once | INTRAVENOUS | Status: AC
  Administered 2021-12-12: 1000 mL via INTRAVENOUS

## 2021-12-12 MED ORDER — PHENYLEPHRINE HCL (PRESSORS) 10 MG/ML IV SOLN
INTRAVENOUS | Status: DC | PRN
  Administered 2021-12-12 (×4): 80 ug via INTRAVENOUS

## 2021-12-12 MED ORDER — LACTATED RINGERS IV SOLN: INTRAVENOUS | Status: DC | PRN

## 2021-12-12 MED ORDER — POLYETHYLENE GLYCOL 3350 17 G PO PACK: 17.0000 g | PACK | Freq: Every day | ORAL | Status: DC | PRN

## 2021-12-12 MED ORDER — METHIMAZOLE 5 MG PO TABS
5.0000 mg | ORAL_TABLET | Freq: Every day | ORAL | Status: DC
  Administered 2021-12-13 – 2021-12-17 (×5): 5 mg via ORAL
  Filled 2021-12-12 (×5): qty 1

## 2021-12-12 MED ORDER — SODIUM CHLORIDE 0.9% FLUSH
3.0000 mL | Freq: Two times a day (BID) | INTRAVENOUS | Status: DC
  Administered 2021-12-13 – 2021-12-16 (×7): 3 mL via INTRAVENOUS

## 2021-12-12 MED ORDER — PROPOFOL 10 MG/ML IV BOLUS
INTRAVENOUS | Status: AC
  Filled 2021-12-12: qty 20

## 2021-12-12 MED ORDER — FENTANYL CITRATE (PF) 100 MCG/2ML IJ SOLN
INTRAMUSCULAR | Status: AC
  Filled 2021-12-12: qty 2

## 2021-12-12 MED ORDER — FENTANYL CITRATE PF 50 MCG/ML IJ SOSY: 25.0000 ug | PREFILLED_SYRINGE | INTRAMUSCULAR | Status: DC | PRN

## 2021-12-12 MED ORDER — ACETAMINOPHEN 500 MG PO TABS
1000.0000 mg | ORAL_TABLET | Freq: Once | ORAL | Status: AC
  Administered 2021-12-12: 1000 mg via ORAL
  Filled 2021-12-12: qty 2

## 2021-12-12 MED ORDER — ACETAMINOPHEN 325 MG PO TABS
650.0000 mg | ORAL_TABLET | Freq: Four times a day (QID) | ORAL | Status: DC | PRN
  Administered 2021-12-13 – 2021-12-15 (×6): 650 mg via ORAL
  Filled 2021-12-12 (×6): qty 2

## 2021-12-12 MED ORDER — ALPRAZOLAM 0.25 MG PO TABS
0.2500 mg | ORAL_TABLET | Freq: Two times a day (BID) | ORAL | Status: DC | PRN
  Administered 2021-12-13 – 2021-12-17 (×5): 0.25 mg via ORAL
  Filled 2021-12-12 (×5): qty 1

## 2021-12-12 MED ORDER — FLUOXETINE HCL 20 MG PO CAPS
40.0000 mg | ORAL_CAPSULE | Freq: Every day | ORAL | Status: DC
Start: 2021-12-13 — End: 2021-12-17
  Administered 2021-12-13 – 2021-12-17 (×5): 40 mg via ORAL
  Filled 2021-12-12 (×5): qty 2

## 2021-12-12 MED ORDER — ACETAMINOPHEN 650 MG RE SUPP: 650.0000 mg | Freq: Four times a day (QID) | RECTAL | Status: DC | PRN

## 2021-12-12 MED ORDER — EPHEDRINE SULFATE (PRESSORS) 50 MG/ML IJ SOLN
INTRAMUSCULAR | Status: DC | PRN
  Administered 2021-12-12: 10 mg via INTRAVENOUS

## 2021-12-12 MED ORDER — SODIUM CHLORIDE 0.9 % IV SOLN
1.0000 g | Freq: Once | INTRAVENOUS | Status: AC
  Administered 2021-12-12: 1 g via INTRAVENOUS
  Filled 2021-12-12: qty 10

## 2021-12-12 MED ORDER — POTASSIUM CHLORIDE 10 MEQ/100ML IV SOLN
10.0000 meq | Freq: Once | INTRAVENOUS | Status: AC
  Administered 2021-12-12: 10 meq via INTRAVENOUS
  Filled 2021-12-12: qty 100

## 2021-12-12 MED ORDER — DEXAMETHASONE SODIUM PHOSPHATE 10 MG/ML IJ SOLN
INTRAMUSCULAR | Status: DC | PRN
  Administered 2021-12-12: 4 mg via INTRAVENOUS

## 2021-12-12 MED ORDER — LIDOCAINE HCL (CARDIAC) PF 100 MG/5ML IV SOSY
PREFILLED_SYRINGE | INTRAVENOUS | Status: DC | PRN
  Administered 2021-12-12: 40 mg via INTRAVENOUS

## 2021-12-12 MED ORDER — SUCCINYLCHOLINE CHLORIDE 200 MG/10ML IV SOSY
PREFILLED_SYRINGE | INTRAVENOUS | Status: DC | PRN
  Administered 2021-12-12: 100 mg via INTRAVENOUS

## 2021-12-12 MED ORDER — BUSPIRONE HCL 10 MG PO TABS
5.0000 mg | ORAL_TABLET | Freq: Every morning | ORAL | Status: DC
  Administered 2021-12-13 – 2021-12-17 (×5): 5 mg via ORAL
  Filled 2021-12-12 (×5): qty 1

## 2021-12-12 MED ORDER — IOHEXOL 180 MG/ML  SOLN
INTRAMUSCULAR | Status: DC | PRN
  Administered 2021-12-12: 10 mL

## 2021-12-12 MED ORDER — OLANZAPINE 7.5 MG PO TABS
7.5000 mg | ORAL_TABLET | Freq: Every day | ORAL | Status: DC
  Administered 2021-12-12 – 2021-12-16 (×5): 7.5 mg via ORAL
  Filled 2021-12-12 (×7): qty 1

## 2021-12-12 MED ORDER — SODIUM CHLORIDE 0.9 % IR SOLN
Status: DC | PRN
  Administered 2021-12-12: 3000 mL

## 2021-12-12 MED ORDER — SODIUM CHLORIDE 0.9 % IV SOLN
1.0000 g | INTRAVENOUS | Status: AC
Start: 2021-12-13 — End: 2021-12-13
  Administered 2021-12-12: 1 g via INTRAVENOUS
  Filled 2021-12-12: qty 10

## 2021-12-12 MED ORDER — ONDANSETRON HCL 4 MG/2ML IJ SOLN
INTRAMUSCULAR | Status: DC | PRN
  Administered 2021-12-12: 4 mg via INTRAVENOUS

## 2021-12-12 MED ORDER — ONDANSETRON HCL 4 MG/2ML IJ SOLN: 4.0000 mg | Freq: Once | INTRAMUSCULAR | Status: DC | PRN

## 2021-12-12 MED ORDER — FENTANYL CITRATE (PF) 100 MCG/2ML IJ SOLN
INTRAMUSCULAR | Status: DC | PRN
  Administered 2021-12-12 (×2): 50 ug via INTRAVENOUS

## 2021-12-12 MED ORDER — PROPOFOL 10 MG/ML IV BOLUS
INTRAVENOUS | Status: DC | PRN
  Administered 2021-12-12: 80 mg via INTRAVENOUS

## 2021-12-12 MED ORDER — POTASSIUM CHLORIDE CRYS ER 20 MEQ PO TBCR
40.0000 meq | EXTENDED_RELEASE_TABLET | Freq: Once | ORAL | Status: AC
Start: 2021-12-12 — End: 2021-12-12
  Administered 2021-12-12: 40 meq via ORAL
  Filled 2021-12-12: qty 2

## 2021-12-12 MED ORDER — MIRTAZAPINE 15 MG PO TABS
15.0000 mg | ORAL_TABLET | Freq: Every day | ORAL | Status: DC
  Administered 2021-12-12 – 2021-12-16 (×5): 15 mg via ORAL
  Filled 2021-12-12 (×5): qty 1

## 2021-12-12 SURGICAL SUPPLY — 20 items
BAG DRAIN CYSTO-URO LG1000N (MISCELLANEOUS) ×3 IMPLANT
BRUSH SCRUB EZ 1% IODOPHOR (MISCELLANEOUS) ×3 IMPLANT
CATH URETL OPEN 5X70 (CATHETERS) ×3 IMPLANT
GAUZE 4X4 16PLY ~~LOC~~+RFID DBL (SPONGE) ×6 IMPLANT
GLOVE BIO SURGEON STRL SZ 6.5 (GLOVE) ×3 IMPLANT
GOWN STRL REUS W/ TWL LRG LVL3 (GOWN DISPOSABLE) ×4 IMPLANT
GOWN STRL REUS W/TWL LRG LVL3 (GOWN DISPOSABLE) ×4
GUIDEWIRE ANG ZIPWIRE 035X150 (WIRE) ×2 IMPLANT
GUIDEWIRE STR DUAL SENSOR (WIRE) ×3 IMPLANT
IV NS IRRIG 3000ML ARTHROMATIC (IV SOLUTION) ×3 IMPLANT
KIT TURNOVER CYSTO (KITS) ×3 IMPLANT
PACK CYSTO AR (MISCELLANEOUS) ×3 IMPLANT
SET CYSTO W/LG BORE CLAMP LF (SET/KITS/TRAYS/PACK) ×3 IMPLANT
STENT URET 6FRX24 CONTOUR (STENTS) ×2 IMPLANT
STENT URET 6FRX28 CONTOUR (STENTS) ×2 IMPLANT
SURGILUBE 2OZ TUBE FLIPTOP (MISCELLANEOUS) ×3 IMPLANT
SYR TOOMEY IRRIG 70ML (MISCELLANEOUS) ×2
SYRINGE TOOMEY IRRIG 70ML (MISCELLANEOUS) ×1 IMPLANT
WATER STERILE IRR 1000ML POUR (IV SOLUTION) ×3 IMPLANT
WATER STERILE IRR 500ML POUR (IV SOLUTION) ×3 IMPLANT

## 2021-12-12 NOTE — Anesthesia Procedure Notes (Signed)
Procedure Name: Intubation Date/Time: 12/12/2021 6:28 PM  Performed by: Lerry Liner, CRNAPre-anesthesia Checklist: Patient identified, Emergency Drugs available, Suction available and Patient being monitored Patient Re-evaluated:Patient Re-evaluated prior to induction Oxygen Delivery Method: Circle system utilized Preoxygenation: Pre-oxygenation with 100% oxygen Induction Type: IV induction Laryngoscope Size: McGraph and 3 Grade View: Grade I Tube type: Oral Tube size: 7.0 mm Number of attempts: 1 Airway Equipment and Method: Stylet and Oral airway Placement Confirmation: ETT inserted through vocal cords under direct vision, positive ETCO2 and breath sounds checked- equal and bilateral Secured at: 22 cm Tube secured with: Tape Dental Injury: Teeth and Oropharynx as per pre-operative assessment

## 2021-12-12 NOTE — H&P (Signed)
History and Physical   Amber [name] UKG:[id number] DOB: 26-Feb-1946 DOA: 12/12/2021  PCP: Lindell Spar, MD   Patient coming from: Home  Chief Complaint: Lethargy, decreased p.o. intake, depression  HPI: Amber [name] is a 76 y.o. female with medical history significant of incontinence, depression, anxiety, hypothyroidism, abnormal gait, GERD, hyperparathyroidism, chronic DVT history of PE, CKD 3A, anemia, thoracic aortic aneurysm, stroke presenting with lethargy, decreased p.o. intake and depression as well as some abdominal pain and nausea.  Patient has had worsening lethargy and family concern for depressive symptoms for 2 to 3 weeks.  Patient says these are stable.  But for the last few days she is also had even worsening weakness and decreased p.o. intake.  She has associated nausea and abdominal pain however this appears to been going on longer the last few days.  She also reports of having rolled out of her bed a couple days ago and landing on her shoulder but not hitting her head.  Additionally reports dark urine but denies dysuria.  Also reporting generalized weakness as above.  Patient denies fevers, chills, chest pain, shortness of breath, constipation, diarrhea.  ED Course: Vital signs in the ED significant for blood pressure in the 762G systolic.  Lab work-up included CMP with potassium 2.9, BUN 24, creatinine stable at 1.28, glucose 142, albumin 2.8.  CBC with hemoglobin stable at 9.3, leukocytosis of 13.1.  TSH remains low at 0.06, troponin mildly elevated at 41 with repeat pending.  Procalcitonin elevated at 0.12, COVID screen pending urinalysis showed hemoglobin, protein, nitrate, leukocytes, bacteria.  Urine cultures pending.  Ammonia level normal.  Chest x-ray showed no acute normality.  Right shoulder x-ray showed no acute normality.  CT head showed no acute abnormality.  CT C-spine showed no acute abnormality.  CT of the abdomen pelvis showed obstructive renal calculi with  severe right hydronephrosis and right perinephric fat stranding as well as a stable left renal mass.  Patient received Tylenol, ceftriaxone, liter fluids and 10 mEq IV potassium and 40 mEq p.o. potassium in the ED.  Urology was consulted and plan to take patient for procedure this evening.  Review of Systems: As per HPI otherwise all other systems reviewed and are negative.  Past Medical History:  Diagnosis Date   ABLA (acute blood loss anemia) 06/19/2021   Anxiety    Aspiration pneumonia of right lung due to gastric secretions (HCC)    C. difficile colitis 07/13/2021   COVID-19 virus infection 06/08/2021    Depression    Hypertension    Kidney disease    Stroke [facility])     History reviewed. No pertinent surgical history.  Social History  reports that she has quit smoking. Her smoking use included cigarettes. She has never used smokeless tobacco. She reports that she does not currently use alcohol. She reports that she does not currently use drugs.  Allergies  Allergen Reactions   Shellfish Allergy     "I pass out"   Valium [Diazepam] Other (See Comments)    Makes her want to " fly"    Family History  Problem Relation Age of Onset   High blood pressure Maternal Grandmother   Reviewed on admission  Prior to Admission medications   Medication Sig Start Date End Date Taking? Authorizing Provider  acetaminophen (TYLENOL) 500 MG tablet Take 500 mg by mouth every 6 (six) hours as needed for moderate pain or mild pain.    [provider]  ALPRAZolam Duanne Moron) 0.5 MG tablet  Take 1 tablet (0.5 mg total) by mouth 2 (two) times daily as needed for anxiety. Patient taking differently: Take 0.25 mg by mouth 2 (two) times daily as needed for anxiety. 0.25 mg daily as needed 09/25/21   Ursula Alert, MD  apixaban (ELIQUIS) 5 MG TABS tablet Take 1 tablet (5 mg total) by mouth 2 (two) times daily. 08/16/21   Lindell Spar, MD  busPIRone (BUSPAR) 5 MG tablet Take 1 tablet (5 mg total) by  mouth in the morning. 11/21/21   Ursula Alert, MD  docusate sodium (COLACE) 100 MG capsule Take 1 capsule by mouth 2 (two) times daily. 06/11/21   [provider]  ferrous sulfate 325 (65 FE) MG tablet Take 1 tablet (325 mg total) by mouth daily with breakfast. 07/17/21   Loletha Grayer, MD  FLUoxetine (PROZAC) 40 MG capsule Take 1 capsule (40 mg total) by mouth daily with breakfast. 11/21/21   Ursula Alert, MD  methimazole (TAPAZOLE) 5 MG tablet Take 1 tablet (5 mg total) by mouth daily. 11/07/21   Cassandria Anger, MD  metoprolol succinate (TOPROL-XL) 25 MG 24 hr tablet TAKE 1/2 TABLET BY MOUTH ONCE DAILY 11/01/21   Lindell Spar, MD  mirtazapine (REMERON) 15 MG tablet Take 1 tablet (15 mg total) by mouth at bedtime. 12/10/21   Ursula Alert, MD  Multiple Vitamin (MULTIVITAMIN WITH MINERALS) TABS tablet Take 1 tablet by mouth daily. 07/13/21   Loletha Grayer, MD  OLANZapine (ZYPREXA) 7.5 MG tablet Take 1 tablet (7.5 mg total) by mouth at bedtime. 12/10/21   Ursula Alert, MD  ondansetron (ZOFRAN) 4 MG tablet Take 1 tablet (4 mg total) by mouth every 6 (six) hours as needed for nausea. 07/13/21   Loletha Grayer, MD  polyethylene glycol powder (GLYCOLAX/MIRALAX) 17 GM/SCOOP powder as needed. 08/15/21   [provider]  sodium bicarbonate 650 MG tablet Take 1 tablet (650 mg total) by mouth daily. 11/21/21   Lindell Spar, MD  Vitamin D, Ergocalciferol, (DRISDOL) 1.25 MG (50000 UNIT) CAPS capsule Take 1 capsule (50,000 Units total) by mouth every 7 (seven) days. 06/22/21   Elmarie Shiley, MD    Physical Exam: Vitals:   12/12/21 1600 12/12/21 1649 12/12/21 1700 12/12/21 1730  BP:  (!) 158/75 (!) 142/59 (!) 124/59  Pulse: 73 77 75 73  Resp: (!) 28 17 (!) 26 (!) 21  Temp:      TempSrc:      SpO2: 98% 98% 98% 99%  Weight:      Height:        Physical Exam Constitutional:      General: She is not in acute distress.    Comments: Thin, frail appearing elderly  female.  HENT:     Head: Normocephalic and atraumatic.     Mouth/Throat:     Mouth: Mucous membranes are moist.     Pharynx: Oropharynx is clear.  Eyes:     Extraocular Movements: Extraocular movements intact.     Pupils: Pupils are equal, round, and reactive to light.  Cardiovascular:     Rate and Rhythm: Normal rate and regular rhythm.     Pulses: Normal pulses.     Heart sounds: Normal heart sounds.  Pulmonary:     Effort: Pulmonary effort is normal. No respiratory distress.     Breath sounds: Normal breath sounds.  Abdominal:     General: Bowel sounds are normal. There is no distension.     Palpations: Abdomen is soft.  Tenderness: There is no abdominal tenderness.  Musculoskeletal:        General: No swelling or deformity.  Skin:    General: Skin is warm and dry.  Neurological:     General: No focal deficit present.     Mental Status: Mental status is at baseline.    Labs on Admission: I have personally reviewed following labs and imaging studies  CBC: Recent Labs  Lab 12/12/21 1515  WBC 13.4*  NEUTROABS 12.3*  HGB 9.3*  HCT 29.9*  MCV 85.4  PLT 440    Basic Metabolic Panel: Recent Labs  Lab 12/12/21 1515 12/12/21 1530  NA 140  --   K 2.9*  --   CL 109  --   CO2 24  --   GLUCOSE 142*  --   BUN 24*  --   CREATININE 1.28*  --   CALCIUM 9.8  --   MG  --  1.9    GFR: Estimated Creatinine Clearance: 36.6 mL/min (A) (by C-G formula based on SCr of 1.28 mg/dL (H)).  Liver Function Tests: Recent Labs  Lab 12/12/21 1515  AST 12*  ALT 9  ALKPHOS 58  BILITOT 0.9  PROT 6.6  ALBUMIN 2.8*    Urine analysis:    Component Value Date/Time   COLORURINE YELLOW (A) 12/12/2021 1515   APPEARANCEUR TURBID (A) 12/12/2021 1515   APPEARANCEUR Cloudy (A) 03/30/2021 1431   LABSPEC 1.014 12/12/2021 1515   PHURINE 6.0 12/12/2021 1515   GLUCOSEU NEGATIVE 12/12/2021 1515   HGBUR MODERATE (A) 12/12/2021 1515   BILIRUBINUR NEGATIVE 12/12/2021 1515    BILIRUBINUR Negative 03/30/2021 1431   KETONESUR NEGATIVE 12/12/2021 1515   PROTEINUR 100 (A) 12/12/2021 1515   NITRITE POSITIVE (A) 12/12/2021 1515   LEUKOCYTESUR MODERATE (A) 12/12/2021 1515    Radiological Exams on Admission: DG OR UROLOGY CYSTO IMAGE ([place])  Result Date: 12/12/2021 There is no interpretation for this exam.  This order is for images obtained during a surgical procedure.  Please See "Surgeries" Tab for more information regarding the procedure.   DG Shoulder Right  Result Date: 12/12/2021 CLINICAL DATA:  Fall.  Weakness.  Failure to thrive. EXAM: RIGHT SHOULDER - 2+ VIEW COMPARISON:  PET-CT 11/08/2021, AP chest 06/27/2021 FINDINGS: There is diffuse decreased bone mineralization. Mild inferior glenoid degenerative osteophytosis. No acute fracture or dislocation. Small sclerotic focus within the slightly medial aspect of the humeral head is unchanged from 06/27/2021 chest radiograph. IMPRESSION: No acute fracture. Electronically Signed   By: Yvonne Kendall M.D.   On: 12/12/2021 16:59   DG Chest 2 View  Result Date: 12/12/2021 CLINICAL DATA:  Weakness. EXAM: CHEST - 2 VIEW COMPARISON:  June 27, 2021 FINDINGS: Tortuosity and calcific atherosclerotic disease of the aorta. Cardiomediastinal silhouette is normal. Mediastinal contours appear intact. There is no evidence of focal airspace consolidation, pleural effusion or pneumothorax. Osseous structures are without acute abnormality. Soft tissues are grossly normal. IMPRESSION: No active cardiopulmonary disease. Electronically Signed   By: Fidela Salisbury M.D.   On: 12/12/2021 16:55   CT ABDOMEN PELVIS W CONTRAST  Result Date: 12/12/2021 CLINICAL DATA:  Acute nonlocalized abdominal pain. EXAM: CT ABDOMEN AND PELVIS WITH CONTRAST TECHNIQUE: Multidetector CT imaging of the abdomen and pelvis was performed using the standard protocol following bolus administration of intravenous contrast. RADIATION DOSE REDUCTION: This exam  was performed according to the departmental dose-optimization program which includes automated exposure control, adjustment of the mA and/or kV according to patient size and/or use of  iterative reconstruction technique. CONTRAST:  25m OMNIPAQUE IOHEXOL 300 MG/ML  SOLN COMPARISON:  June 14, 2021 FINDINGS: Lower chest: Enlarged heart. Calcific atherosclerotic disease of the coronary arteries. Hepatobiliary: Left liver cyst. Normal appearance of the gallbladder. Pancreas: Unremarkable. No pancreatic ductal dilatation or surrounding inflammatory changes. Spleen: Normal in size without focal abnormality. Adrenals/Urinary Tract: Normal adrenal glands. Partially exophytic left renal cyst, stable. Again seen is obstructing ureteral stone in the right UPJ measuring 1.1 cm. A second 8 mm calculus is located within the proximal right ureter slightly distal to the UPJ where the ureter has a reverse S shaped configuration. There is right perinephric fat stranding. Stomach/Bowel: Stomach is within normal limits. Appendix appears normal. No evidence of bowel wall thickening, distention, or inflammatory changes. Vascular/Lymphatic: Aortic atherosclerosis. No enlarged abdominal or pelvic lymph nodes. 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 Coarse calcifications within the right adnexa. Other: No abdominal wall hernia or abnormality. No abdominopelvic ascites. Musculoskeletal: No acute or significant osseous findings. IMPRESSION: 1. Two obstructing ureteral calculi in the right UPJ and proximal right ureter, with reverse S shaped configuration of the proximal right ureter. Resultant severe right hydronephrosis and right perinephric stranding. 2. 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 3. Enlarged heart. 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 Aortic Atherosclerosis (ICD10-I70.0). Electronically Signed   By: DFidela SalisburyM.D.   On: 12/12/2021 16:40   CT HEAD WO CONTRAST (5MM)  Result Date: 12/12/2021 CLINICAL DATA:  Altered mental status. EXAM: CT HEAD WITHOUT CONTRAST CT CERVICAL SPINE WITHOUT CONTRAST TECHNIQUE: Multidetector CT imaging of the head and cervical spine was performed following the standard protocol without intravenous contrast. Multiplanar CT image reconstructions of the cervical spine were also generated. RADIATION DOSE REDUCTION: This exam was performed according to the departmental dose-optimization program which includes automated exposure control, adjustment of the mA and/or kV according to patient size and/or use of iterative reconstruction technique. COMPARISON:  June 08, 2021. FINDINGS: CT HEAD FINDINGS Brain: No evidence of acute infarction, hemorrhage, hydrocephalus, extra-axial collection or mass lesion/mass effect. Vascular: No hyperdense vessel or unexpected calcification. Skull: Normal. Negative for fracture or focal lesion. Sinuses/Orbits: No acute finding. Other: None. CT CERVICAL SPINE FINDINGS Alignment: Mild grade 1 anterolisthesis of C4-5 is noted secondary to posterior facet joint hypertrophy. Skull base and vertebrae: No acute fracture. No primary bone lesion or focal pathologic process. Soft tissues and spinal canal: No prevertebral fluid or swelling. No visible canal hematoma. Disc levels: Moderate degenerative disc disease is noted at C5-6 and C6-7. Upper chest: Negative. Other: None. IMPRESSION: No acute intracranial abnormality seen. Moderate multilevel degenerative disc disease. No acute abnormality seen in the cervical spine. Electronically Signed   By: JMarijo ConceptionM.D.   On: 12/12/2021 16:34   CT Cervical Spine Wo Contrast  Result Date: 12/12/2021 CLINICAL DATA:  Altered mental status. EXAM:  CT HEAD WITHOUT CONTRAST CT CERVICAL SPINE WITHOUT CONTRAST TECHNIQUE: Multidetector CT imaging of the head and cervical spine was performed following the standard protocol without intravenous contrast. Multiplanar CT image reconstructions of the cervical spine were also generated. RADIATION DOSE REDUCTION: This exam was performed according to the departmental dose-optimization program which includes automated exposure control, adjustment of the mA and/or kV according to patient size and/or use of iterative reconstruction technique. COMPARISON:  June 08, 2021. FINDINGS: CT HEAD FINDINGS Brain: No evidence of acute infarction, hemorrhage, hydrocephalus, extra-axial collection or mass lesion/mass effect. Vascular: No hyperdense vessel or unexpected calcification. Skull: Normal. Negative for fracture or focal lesion. Sinuses/Orbits: No acute finding. Other: None. CT CERVICAL SPINE FINDINGS Alignment: Mild grade 1 anterolisthesis of C4-5 is noted secondary to posterior facet joint hypertrophy. Skull base and vertebrae: No acute fracture. No primary bone lesion or focal pathologic process. Soft tissues and spinal canal: No prevertebral fluid or swelling. No visible canal hematoma. Disc levels: Moderate degenerative disc disease is noted at C5-6 and C6-7. Upper chest: Negative. Other: None. IMPRESSION: No acute intracranial abnormality seen. Moderate multilevel degenerative disc disease. No acute abnormality seen in the cervical spine. Electronically Signed   By: Marijo Conception M.D.   On: 12/12/2021 16:34    EKG: Independently reviewed.  Sinus rhythm at 77 bpm.  Left bundle branch block.  Similar to previous.  Assessment/Plan Principal Problem:   UTI (urinary tract infection) Active Problems:   Urinary incontinence   Depression with anxiety   Hyperthyroidism   Chronic deep vein thrombosis (DVT) (HCC)   Stage 3a chronic kidney disease (HCC)   Normocytic anemia   Hydronephrosis with obstructing calculus    Hypokalemia   UTI Hydronephrosis with obstructing renal stone > Presenting with multiple complaints but they do include abdominal pain, decreased p.o. intake, dark urine. > Work-up in the ED largely benign but did note obstructive renal calculi on the right with severe right hydronephrosis and perinephric fat stranding. > Urinalysis consistent with UTI with hemoglobin, protein, nitrates, leukocytes, bacteria.  Urine cultures pending. > Patient on ceftriaxone in the ED.  Urology was consulted and are taking patient for cystoscopy tonight. - Monitor telemetry - Appreciate urology assistance with this patient - Continue ceftriaxone - Trend fever curve and WBC - Follow-up urine cultures  Depression Anxiety > Has had history of severe depression, has required ECT in the past. - Continue home BuSpar, Fluxid teen, mirtazapine, olanzapine - Continue as needed Xanax  Hypothyroidism - Continue home methimazole  History of DVT and PE - Holding home Eliquis periprocedurally - Evaluate for resumption tomorrow  Hypokalemia CKD 3A > Creatinine remained stable at 1.28 in the ED.  Potassium low at 2.9, magnesium normal. > Received 40 mEq p.o. potassium and 10 mEq IV potassium in the ED. - Trend renal function and electrolytes - Avoid nephrotoxic agents  Anemia > Hemoglobin stable at 9.3 - Trend CBC  DVT prophylaxis: SCDs for now, consider restarting Eliquis tomorrow, holding periprocedurally. Code Status:   Full Family Communication:  Updated at bedside Disposition Plan:   Patient is from:  Home  Anticipated DC to:  Home  Anticipated DC date:  1 to 3 days  Anticipated DC barriers: None  Consults called:  Urology, consulted in the ED, have seen the patient. Admission status:  Observation, telemetry  Severity of Illness: The appropriate patient status for this patient is OBSERVATION. Observation status is judged to be reasonable and necessary in order to provide the required intensity  of service to ensure the patient's safety. The patient's presenting symptoms, physical exam findings, and initial radiographic and laboratory data in the context of their medical condition is felt to place them at decreased risk for further clinical deterioration. Furthermore, it is anticipated that the patient will be medically stable for discharge from the hospital within 2 midnights of admission.    Marcelyn Bruins MD [facility]  How to contact the [facility] Attending  or Consulting provider [place] or covering provider during after hours [place], for this patient?   Check the care team in [facility] and look for a) attending/consulting [facility] provider listed and b) the [facility] team listed Log into [email or web address] and use [place] universal password to access. If you do not have the password, please contact the hospital operator. Locate the [facility] provider you are looking for under [facility] and page to a number that you can be directly reached. If you still have difficulty reaching the provider, please page the [facility] (Director on Call) for the Hospitalists listed on amion for assistance.  12/12/2021, 6:02 PM

## 2021-12-12 NOTE — Transfer of Care (Signed)
Immediate Anesthesia Transfer of Care Note  Patient: Amber [name]  Procedure(s) Performed: CYSTOSCOPY WITH RETROGRADE PYELOGRAM/URETERAL STENT PLACEMENT (Right)  Patient Location: PACU  Anesthesia Type:General  Level of Consciousness: drowsy  Airway & Oxygen Therapy: Patient Spontanous Breathing and Patient connected to face mask oxygen  Post-op Assessment: Report given to RN  Post vital signs: stable  Last Vitals:  Vitals Value Taken Time  BP 118/65 12/12/21 1900  Temp    Pulse 71 12/12/21 1901  Resp 18 12/12/21 1901  SpO2 100 % 12/12/21 1901  Vitals shown include unvalidated device data.  Last Pain:  Vitals:   12/12/21 1719  TempSrc:   PainSc: 3          Complications: No notable events documented.

## 2021-12-12 NOTE — Consult Note (Signed)
Urology Consult  I have been asked to see the patient by Dr. Tamala Julian, for evaluation and management of right proximal ureteral calculi.  Chief Complaint: Failure to thrive  History of Present Illness: Amber [name] is a 76 y.o. year old female with personal history of what sounds like microscopic hematuria, recurrent UTIs and known right nonobstructing kidney stone followed by Dr. Milford Cage in [place] as an outpatient who presented with several weeks of failure to thrive.  Per family, she stopped eating and has become more lethargic and less active.  This was similar to her presentation earlier this year when she was admitted for psychiatric issues.  She denies any flank pain, dysuria, gross hematuria, fevers or chills.  In the emergency room, she is a low-grade temp of 99, nontachycardic, borderline leukocytosis of 13, frankly positive urinalysis and CT scan demonstrating 2 right proximal ureteral calculi with hydronephrosis.  There is tortuosity of the left proximal ureter with dilation/hydronephrosis and hydroureter.  History today is primarily provided by the patient's daughter and husband who are at bedside today.  They report that Dr. 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  She had what sounds like an office cystoscopy.  She did also have a urinary tract infection earlier this year but is not particularly prone to these prior to this.  Amber [name] herself has minimal complaints.  She is not having any pain.  She denies any urinary tract symptoms.  She has had some nausea and vomiting along with poor p.o. intake.  She also has generalized weakness.  Past Medical History:  Diagnosis Date   ABLA (acute blood loss anemia) 06/19/2021   Anxiety    Aspiration pneumonia of right lung due to gastric secretions (HCC)    C. difficile colitis  07/13/2021   COVID-19 virus infection 06/08/2021    Depression    Hypertension    Kidney disease    Stroke [facility])     History reviewed. No pertinent surgical history.  Home Medications:  No outpatient medications have been marked as taking for the 12/12/21 encounter [facility] Encounter).    Allergies:  Allergies  Allergen Reactions   Shellfish Allergy     "I pass out"   Valium [Diazepam] Other (See Comments)    Makes her want to " fly"    Family History  Problem Relation Age of Onset   High blood pressure Maternal Grandmother     Social History:  reports that she has quit smoking. Her smoking use included cigarettes. She has never used smokeless tobacco. She reports that she does not currently use alcohol. She reports that she does not currently use drugs.  ROS: A complete review of systems was performed.  All systems are negative except for pertinent findings as noted.  Physical Exam:  Vital signs in last 24 hours: Temp:  [99.1 F (37.3 C)] 99.1 F (37.3 C) (07/19 1507) Pulse Rate:  [73-77] 77 (07/19 1649) Resp:  [16-28] 17 (07/19 1649) BP: (157-158)/(75-82) 158/75 (07/19 1649) SpO2:  [98 %-99 %] 98 % (07/19 1649) Weight:  [59.9 kg] 59.9 kg (07/19 1510) Constitutional:  Alert and oriented, No acute distress HEENT: [place] AT, moist mucus membranes.  Trachea midline, no masses Cardiovascular: Regular rate and rhythm, no clubbing, cyanosis, or edema. Respiratory: Normal respiratory effort, lungs clear bilaterally GI:  Abdomen is soft, nontender, nondistended, no abdominal masses GU: No CVA tenderness Skin: No rashes, bruises or suspicious lesions Lymph: No cervical or inguinal adenopathy Neurologic: Grossly intact, no focal deficits, moving all 4 extremities Psychiatric: Normal mood and affect   Laboratory Data:  Recent Labs    12/12/21 1515  WBC 13.4*  HGB 9.3*  HCT 29.9*   Recent Labs    12/12/21 1515  NA 140  K 2.9*  CL 109  CO2 24  GLUCOSE 142*  BUN  24*  CREATININE 1.28*  CALCIUM 9.8   Component     Latest Ref Rng 12/12/2021  Color, Urine     YELLOW  YELLOW !   Appearance     CLEAR  TURBID !   Specific Gravity, Urine     1.005 - 1.030  1.014   pH     5.0 - 8.0  6.0   Glucose, UA     NEGATIVE mg/dL NEGATIVE   Hgb urine dipstick     NEGATIVE  MODERATE !   Bilirubin Urine     NEGATIVE  NEGATIVE   Ketones, ur     NEGATIVE mg/dL NEGATIVE   Protein     NEGATIVE mg/dL 100 !   Nitrite     NEGATIVE  POSITIVE !   Leukocytes,Ua     NEGATIVE  MODERATE !   RBC / HPF     0 - 5 RBC/hpf >50 (H)   WBC, UA     0 - 5 WBC/hpf >50 (H)   Bacteria, UA     NONE SEEN  MANY !   Squamous Epithelial / LPF     0 - 5  NONE SEEN   Mucus PRESENT   WBC Clumps PRESENT   Budding Yeast PRESENT     Legend: ! Abnormal (H) High  Radiologic Imaging: CT ABDOMEN PELVIS W CONTRAST  Result Date: 12/12/2021 CLINICAL DATA:  Acute nonlocalized abdominal pain. EXAM: CT ABDOMEN AND PELVIS WITH CONTRAST TECHNIQUE: Multidetector CT imaging of the abdomen and pelvis was performed using the standard protocol following bolus administration of intravenous contrast. RADIATION DOSE REDUCTION: This exam was performed according to the departmental dose-optimization program which includes automated exposure control, adjustment of the mA and/or kV according to patient size and/or use of iterative reconstruction technique. CONTRAST:  6m OMNIPAQUE IOHEXOL 300 MG/ML  SOLN COMPARISON:  June 14, 2021 FINDINGS: Lower chest: Enlarged heart. Calcific atherosclerotic disease of the coronary arteries. Hepatobiliary: Left liver cyst. Normal appearance of the gallbladder. Pancreas: Unremarkable. No pancreatic ductal dilatation or surrounding inflammatory changes. Spleen: Normal in size without focal abnormality. Adrenals/Urinary Tract: Normal adrenal glands. Partially exophytic left renal cyst, stable. Again seen is obstructing ureteral stone in the right UPJ measuring 1.1 cm. A  second 8 mm calculus is located within the proximal right ureter slightly distal to the UPJ where the ureter has a reverse S shaped configuration. There is right perinephric fat stranding. Stomach/Bowel: Stomach is within normal limits. Appendix appears normal. No evidence of bowel wall thickening, distention, or inflammatory changes. Vascular/Lymphatic: Aortic atherosclerosis. No enlarged abdominal or pelvic lymph nodes. 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 Coarse calcifications within the right adnexa. Other: No abdominal wall hernia or abnormality. No abdominopelvic ascites. Musculoskeletal: No acute or significant osseous findings. IMPRESSION: 1. Two obstructing ureteral calculi in the right UPJ and proximal right ureter, with reverse S  shaped configuration of the proximal right ureter. Resultant severe right hydronephrosis and right perinephric stranding. 2. 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 3. Enlarged heart. 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 Aortic Atherosclerosis (ICD10-I70.0). Electronically Signed   By: Fidela Salisbury M.D.   On: 12/12/2021 16:40   CT of the abdomen pelvis was personally reviewed, agree with radiologic interpretation.  Impression/plan:  1.  Right proximal ureteral calculi x2-based on the size and configuration as well as obstruction, eventually she will need definitive management of her stone, likely ureteroscopy.  In the setting of urinary tract infection as well as mother nonspecific symptoms including nausea vomiting and failure to thrive, I do believe that she would be best served with ureteral stent placement for urinary decompression.  This will help decompress her kidney,  clear the infection potentially as well as help facilitate future ureteroscopy as current configuration appears to be quite complex with tortuosity of the proximal ureter which may limit success with primary ureteroscopy without prestent.  Discussed the risk and benefits of ureteral stent placement today.  We discussed the need for further procedures down the road.  We discussed the risk of seeing blood in the urine, the possibility of failure with stent placement especially given current stone configuration and anatomy and possibilities of needing a percutaneous nephrostomy tube if we fail today.  All questions were answered.  Per her family, she does make her own medical decisions and she agrees with this plan as well.  Her husband and daughter also agree.  All questions were answered.  She was site marked on the right and consent was obtained.  2.  UTI-previously grew Pseudomonas which was pansensitive.  She has received ceftriaxone but this would not be sufficient for Pseudomonas.  Will broaden and give additional dose of Zosyn periop.  F/u urine culture.  12/12/2021, 5:41 PM  Hollice Espy,  MD

## 2021-12-12 NOTE — ED Notes (Signed)
Orders will be placed once MD sees pt.

## 2021-12-12 NOTE — ED Triage Notes (Signed)
Pt to ED with daughter (who is POA) and husband via POV. Pt has not been eating or drinking in last few weeks and is just lying in bed all day. Pt has not been getting up to walk, has no interest, no emotion per daughter. Pt was hospitalized at [facility] in 1/23 and then had 6 ECT treatments from [facility] 2023. Daughter states they wanted to have her seen before things get any worse.  Pt has smell of UTI and tongue appears very dry and furrowed.

## 2021-12-12 NOTE — Op Note (Signed)
Date of procedure: 12/12/21  Preoperative diagnosis:  Right proximal ureteral stone x2 UTI Right hydronephrosis  Postoperative diagnosis:  Same as above  Procedure: Right ureteral stent placement Right retrograde pyelogram Interpretation of fluoroscopy less than 30 minutes  Surgeon: Hollice Espy, MD  Anesthesia: General  Complications: None  Intraoperative findings: Torturous proximal ureter, somewhat difficult stent placement requiring angled Glidewire.  Ultimately successful, initially shorter stent placed which retracted and replaced with longer 28 cm stent.  Cloudy debris-filled urine both in the collecting system as well as in the bladder with cystitis appreciated.  EBL: Minimal  Specimens: Right renal pelvic urine  Drains: 6 x 28 French double-J ureteral stent  Indication: Amber [name] is a 76 y.o. patient with right proximal ureteral calculi x2, hydronephrosis and nonspecific symptoms including leukocytosis and frankly positive urine.  Ultimately, she elected to have ureteral stent placed with plan for staged procedure down the road..  After reviewing the management options for treatment, she elected to proceed with the above surgical procedure(s). We have discussed the potential benefits and risks of the procedure, side effects of the proposed treatment, the likelihood of the patient achieving the goals of the procedure, and any potential problems that might occur during the procedure or recuperation. Informed consent has been obtained.  Description of procedure:  The patient was taken to the operating room and general anesthesia was induced.  The patient was placed in the dorsal lithotomy position, prepped and draped in the usual sterile fashion, and preoperative antibiotics were administered. A preoperative time-out was performed.   A 21 French scope was advanced per urethra into the bladder.  Notably, the urine was cloudy, debris-filled and there was diffuse cystitis.   Attention was turned to the right ureteral orifice.  An open-ended ureteral catheter was used to intubate the right distal ureter just within the UO.  I did not need to initially shoot a retrograde pyelogram as there was a delayed nephrogram with a dilated collecting system from her previous IV contrast present.  I then advanced a sensor wire initially up to level the proximal ureter and this created a loop presumably from the torturous right proximal ureter but met resistance presumably at the level of the more proximal stone and it was not convinced that the wire was then the renal pelvis.  I advanced the open-ended ureteral catheter to the proximal ureter and manipulated further still unsuccessfully.  I then used an angled Glidewire and was able to advance the wire into the collecting system convincingly with significant maneuvering.  The open-ended ureteral catheter was then advanced over the wire into the collecting system and cloudy blood-tinged urine was aspirated, at least 30 cc which was sent for culture.  This also convincingly represented urine within the collecting system.  I did shoot a little bit of contrast to confirm this.  Next, the sensor wire was replaced and the open-ended ureteral catheter up to the renal pelvis and the open-ended catheter was removed.  Advanced a 6 x 24 French double-J ureteral stent to the level of the renal pelvis however upon retraction of the wire, it looks like the tip of the stent only was within the renal pelvis and the positioning did not appear quite right.  Presumably, the coil had retracted into the proximal ureter due to the redundancy here.  I then grabbed the distal coil of the stent and rewired it up to the level of the upper pole convincingly in the collecting system.  I then exchanged  the stent for 6 x 28.  Upon removing the wire at this time, there was a full coil noted within the upper portion of the renal pelvis and a curl within the bladder.  The bladder  was then drained using the sheath of the scope.  She was then cleaned and dried, repositioned in supine position, reversed from anesthesia, and taken to the PACU in stable condition.  Plan: Would recommend continuing antibiotics to cover Pseudomonas based on her previous urine cultures.  Follow-up urine culture data.  We will plan for outpatient ureteroscopy which could be done in [place] with their established urologist Dr. Milford Cage versus in [place] based on their geographic location or him happy to facilitate this.  Hollice Espy, M.D.

## 2021-12-12 NOTE — ED Triage Notes (Signed)
First Nurse Note:  Arrives with daughter who states for the past 2-3 weeks, patient activity has been declining.  Decreasing PO intake.  Inpatient psychiatric stay in February and ECT therapy had been done.  Daughter states patient has continued to backslide to where she was prior to her inpatient stay.  Daughter also states she felt that patient was warm today and concerned she may have a fever.  Patient arrives AAOx3.  NAD

## 2021-12-12 NOTE — Anesthesia Preprocedure Evaluation (Signed)
Anesthesia Evaluation    History of Anesthesia Complications Negative for: history of anesthetic complications  Airway Mallampati: III     Mouth opening: Limited Mouth Opening  Dental  (+) Dental Advidsory Given, Edentulous Upper, Edentulous Lower   Pulmonary neg shortness of breath, asthma , neg sleep apnea, neg COPD, neg recent URI, former smoker,     + decreased breath sounds      Cardiovascular Exercise Tolerance: Poor hypertension, Pt. on medications (-) angina(-) Past MI and (-) Cardiac Stents + dysrhythmias (-) Valvular Problems/Murmurs Rate:Tachycardia     Neuro/Psych neg Seizures PSYCHIATRIC DISORDERS Anxiety Depression CVA, No Residual Symptoms    GI/Hepatic Neg liver ROS, GERD  ,  Endo/Other  neg diabetesHyperthyroidism   Renal/GU CRFRenal disease (kidney stone)     Musculoskeletal negative musculoskeletal ROS (+)   Abdominal Normal abdominal exam  (+)   Peds negative pediatric ROS (+)  Hematology  (+) Blood dyscrasia, anemia ,   Anesthesia Other Findings Past Medical History: No date: Anxiety No date: Hypertension   Reproductive/Obstetrics negative OB ROS                             Anesthesia Physical  Anesthesia Plan  ASA: 3 and emergent  Anesthesia Plan: General   Post-op Pain Management:    Induction: Intravenous, Rapid sequence and Cricoid pressure planned  PONV Risk Score and Plan: 3 and Ondansetron, Dexamethasone and Treatment may vary due to age or medical condition  Airway Management Planned: Oral ETT  Additional Equipment:   Intra-op Plan:   Post-operative Plan: Extubation in OR  Informed Consent: I have reviewed the patients History and Physical, chart, labs and discussed the procedure including the risks, benefits and alternatives for the proposed anesthesia with the patient or authorized representative who has indicated his/her understanding and  acceptance.       Plan Discussed with: CRNA and Surgeon  Anesthesia Plan Comments:         Anesthesia Quick Evaluation

## 2021-12-12 NOTE — ED Notes (Signed)
This RN collectic two sets of cultures, lactic, red, tigertop, purple, green, blue

## 2021-12-12 NOTE — ED Provider Notes (Signed)
[facility] Provider Note    Event Date/Time   First MD Initiated Contact with Patient 12/12/21 1525     (approximate)   History   Failure To Thrive and Depression   HPI  Amber [name] is a 76 y.o. female with past medical history of HTN, depression, CVA, C. 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  Patient apparently also slid off her bed onto her right shoulder couple days ago.  She states she did not hit her head.  Reportedly has had very little appetite over the last couple days with intermittent nausea.  She denies any burning with urination but notes her urine has been very dark.  She is also had generalized abdominal pain during today.  No reported diarrhea.  Patient states her depression is unchanged from when she was last treated.  She reportedly has been too weak to ambulate in the last 24 hours which and was able to be with a walker.    Past Medical History:  Diagnosis Date   ABLA (acute blood loss anemia) 06/19/2021   Anxiety    Aspiration pneumonia of right lung due to gastric secretions (HCC)    C. difficile colitis 07/13/2021   COVID-19 virus infection 06/08/2021    Depression    Hypertension    Kidney disease    Stroke [facility])      Physical Exam  Triage Vital Signs: ED Triage Vitals  Enc Vitals Group     BP 12/12/21 1507 (!) 157/82     Pulse Rate 12/12/21 1507 76     Resp 12/12/21 1507 16     Temp 12/12/21 1507 99.1 F (37.3 C)     Temp Source 12/12/21 1507 Oral     SpO2 12/12/21 1507 99 %     Weight 12/12/21 1510 132 lb (59.9 kg)     Height 12/12/21 1510 '5\' 6"'$  (1.676 m)     Head Circumference --      Peak Flow --      Pain Score 12/12/21 1508 3     Pain Loc --      Pain Edu? --      Excl. in [place]? --      Most recent vital signs: Vitals:   12/12/21 1600 12/12/21 1649  BP:  (!) 158/75  Pulse: 73 77  Resp: (!) 28 17  Temp:    SpO2: 98% 98%    General: Awake, no distress.  CV:  Slightly prolonged cap refill.  2+ radial pulse.  No significant murmur. Resp:  Normal effort.  Clear bilaterally. Abd:  No distention.  Soft but mildly tender throughout.  No significant CVA tenderness. Other:  Dry mucous membranes.  Patient is states that the patient and year.  There is no obvious trauma to the face scalp head or neck or tenderness along the C/T/L-spine.  She is able to move all her extremities although has a little soreness on passive range of motion of the right shoulder.   ED Results / Procedures / Treatments  Labs (all labs ordered are listed, but only abnormal results are displayed) Labs Reviewed  URINALYSIS, COMPLETE (UACMP) WITH MICROSCOPIC - Abnormal; Notable for the following components:      Result Value   Color, Urine YELLOW (*)    APPearance TURBID (*)  Hgb urine dipstick MODERATE (*)    Protein, ur 100 (*)    Nitrite POSITIVE (*)    Leukocytes,Ua MODERATE (*)    RBC / HPF >50 (*)    WBC, UA >50 (*)    Bacteria, UA MANY (*)    All other components within normal limits  CBC WITH DIFFERENTIAL/PLATELET - Abnormal; Notable for the following components:   WBC 13.4 (*)    RBC 3.50 (*)    Hemoglobin 9.3 (*)    HCT 29.9 (*)    Neutro Abs 12.3 (*)    Lymphs Abs 0.5 (*)    Abs Immature Granulocytes 0.08 (*)    All other components within normal limits  COMPREHENSIVE METABOLIC PANEL - Abnormal; Notable for the following components:   Potassium 2.9 (*)    Glucose, Bld 142 (*)    BUN 24 (*)    Creatinine, Ser 1.28 (*)    Albumin 2.8 (*)    AST 12 (*)    GFR, Estimated 44 (*)    All other components within normal limits  TSH - Abnormal; Notable for the following components:   TSH 0.060 (*)    All other components within normal limits  TROPONIN I (HIGH SENSITIVITY) -  Abnormal; Notable for the following components:   Troponin I (High Sensitivity) 41 (*)    All other components within normal limits  SARS CORONAVIRUS 2 BY RT PCR  URINE CULTURE  AMMONIA  PROCALCITONIN  MAGNESIUM  T4, FREE     EKG  EKG is remarkable for sinus rhythm with a ventricular rate of 77, left bundle branch block with some nonspecific ST changes throughout without other clear evidence of acute ischemia or significant arrhythmia.    RADIOLOGY  CT head and C-spine my interpretation no evidence of skull fracture, intracranial hemorrhage, edema, mass effect ischemia or acute C-spine injury.  I reviewed radiologist interpretation and agree to findings of same in addition to degenerative changes.   CT abdomen pelvis my interpretation shows right-sided hydronephrosis without evidence of pneumatosis, diverticulitis.  No appear to be 2 stones in the ureter.  I reviewed radiology's interpretation and agree their findings of 2 obstructing ureteral stones in the right UPJ and proximal right ureter with severe hydronephrosis and perinephric stranding as well as stable exophytic mass in the left pelvis as well as cardiomegaly and CAD as well as aortic atherosclerosis.  Chest reviewed by myself shows no focal consoidation, effusion, edema, pneumothorax or other clear acute thoracic process. I also reviewed radiology interpretation and agree with findings described.  X-ray of the right shoulder my interpretation without evidence of acute fracture or dislocation.  I also reviewed radiology's interpretation.   PROCEDURES:  Critical Care performed: No  .1-3 Lead EKG Interpretation  Performed by: Lucrezia Starch, MD Authorized by: Lucrezia Starch, MD     Interpretation: normal     ECG rate assessment: normal     Rhythm: sinus rhythm     Ectopy: none     Conduction: normal     The patient is on the cardiac monitor to evaluate for evidence of arrhythmia and/or significant heart rate  changes.   MEDICATIONS ORDERED IN ED: Medications  potassium chloride 10 mEq in 100 mL IVPB (10 mEq Intravenous New Bag/Given 12/12/21 1652)  lactated ringers bolus 1,000 mL (1,000 mLs Intravenous New Bag/Given 12/12/21 1652)  acetaminophen (TYLENOL) tablet 1,000 mg (1,000 mg Oral Given 12/12/21 1646)  potassium chloride SA (KLOR-CON M) CR tablet 40 mEq (40  mEq Oral Given 12/12/21 1646)  cefTRIAXone (ROCEPHIN) 1 g in sodium chloride 0.9 % 100 mL IVPB (0 g Intravenous Stopped 12/12/21 1719)  iohexol (OMNIPAQUE) 300 MG/ML solution 75 mL (75 mLs Intravenous Contrast Given 12/12/21 1612)     IMPRESSION / MDM / ASSESSMENT AND PLAN / ED COURSE  I reviewed the triage vital signs and the nursing notes. Patient's presentation is most consistent with acute presentation with potential threat to life or bodily function.                               Differential diagnosis includes, but is not limited to symptoms possibly late cystitis, kidney stone, diverticulitis, SBO, ileus, metabolic derangements as well as possible injury to the shoulder after recent fall.  Patient does not think she hit her head but is on percent sure and given her age infectious anticoagulated obtain CT head and C-spine as well.  EKG is remarkable for sinus rhythm with a ventricular rate of 77, left bundle branch block with some nonspecific ST changes throughout without other clear evidence of acute ischemia or significant arrhythmia.   CT head and C-spine my interpretation no evidence of skull fracture, intracranial hemorrhage, edema, mass effect ischemia or acute C-spine injury.  I reviewed radiologist interpretation and agree to findings of same in addition to degenerative changes.  CT abdomen pelvis my interpretation shows right-sided hydronephrosis without evidence of pneumatosis, diverticulitis.  No appear to be 2 stones in the ureter.  I reviewed radiology's interpretation and agree their findings of 2 obstructing ureteral  stones in the right UPJ and proximal right ureter with severe hydronephrosis and perinephric stranding as well as stable exophytic mass in the left pelvis as well as cardiomegaly and CAD as well as aortic atherosclerosis.  Chest reviewed by myself shows no focal consoidation, effusion, edema, pneumothorax or other clear acute thoracic process. I also reviewed radiology interpretation and agree with findings described.  X-ray of the right shoulder my interpretation without evidence of acute fracture or dislocation.  I also reviewed radiology's interpretation.  CBC shows WBC count 13.4, hemoglobin of 9.3 compared to 9.71-monthago and normal platelets.  CMP is remarkable for K of 2.9, creatinine of 1.28 compared to 1.272-monthgo with albumin of 2.8 but no other significant electrolyte or metabolic derangements.  Ammonia 17.  TSH is 0.06.  Magnesium 1.9.  COVID PCR negative.  Procalcitonin 0.12.  Urine culture sent.  Initial troponin slightly elevated 41 I suspect represents a mild demand in the setting of acute UTI and apparent pyelonephritis infected kidney stone.  Given concern for infected kidney stone with hydronephrosis consulted with on-call urologist spoke with Dr. AsHollice Espyho will evaluate patient.  I will admit to medicine service for further evaluation and management.     FINAL CLINICAL IMPRESSION(S) / ED DIAGNOSES   Final diagnoses:  Acute cystitis with hematuria  Hypokalemia  Weakness  Kidney stone  Troponin I above reference range  Pyelonephritis     Rx / DC Orders   ED Discharge Orders     None        Note:  This document was prepared using Dragon voice recognition software and may include unintentional dictation errors.   SmLucrezia StarchMD 12/12/21 [phone]

## 2021-12-12 NOTE — ED Notes (Signed)
Report given to the OR.

## 2021-12-13 ENCOUNTER — Other Ambulatory Visit: Payer: Self-pay | Admitting: Physician Assistant

## 2021-12-13 ENCOUNTER — Encounter: Payer: Self-pay | Admitting: Urology

## 2021-12-13 DIAGNOSIS — Z8673 Personal history of transient ischemic attack (TIA), and cerebral infarction without residual deficits: Secondary | ICD-10-CM | POA: Diagnosis not present

## 2021-12-13 DIAGNOSIS — N201 Calculus of ureter: Secondary | ICD-10-CM

## 2021-12-13 DIAGNOSIS — Z86711 Personal history of pulmonary embolism: Secondary | ICD-10-CM | POA: Diagnosis not present

## 2021-12-13 DIAGNOSIS — Z7901 Long term (current) use of anticoagulants: Secondary | ICD-10-CM | POA: Diagnosis not present

## 2021-12-13 DIAGNOSIS — Z91013 Allergy to seafood: Secondary | ICD-10-CM | POA: Diagnosis not present

## 2021-12-13 DIAGNOSIS — I712 Thoracic aortic aneurysm, without rupture, unspecified: Secondary | ICD-10-CM | POA: Diagnosis present

## 2021-12-13 DIAGNOSIS — N136 Pyonephrosis: Secondary | ICD-10-CM | POA: Diagnosis present

## 2021-12-13 DIAGNOSIS — E876 Hypokalemia: Secondary | ICD-10-CM | POA: Diagnosis present

## 2021-12-13 DIAGNOSIS — I1310 Hypertensive heart and chronic kidney disease without heart failure, with stage 1 through stage 4 chronic kidney disease, or unspecified chronic kidney disease: Secondary | ICD-10-CM | POA: Diagnosis present

## 2021-12-13 DIAGNOSIS — Y92003 Bedroom of unspecified non-institutional (private) residence as the place of occurrence of the external cause: Secondary | ICD-10-CM | POA: Diagnosis not present

## 2021-12-13 DIAGNOSIS — E039 Hypothyroidism, unspecified: Secondary | ICD-10-CM | POA: Diagnosis present

## 2021-12-13 DIAGNOSIS — F419 Anxiety disorder, unspecified: Secondary | ICD-10-CM | POA: Diagnosis present

## 2021-12-13 DIAGNOSIS — Z6821 Body mass index (BMI) 21.0-21.9, adult: Secondary | ICD-10-CM | POA: Diagnosis not present

## 2021-12-13 DIAGNOSIS — N390 Urinary tract infection, site not specified: Secondary | ICD-10-CM | POA: Diagnosis not present

## 2021-12-13 DIAGNOSIS — N132 Hydronephrosis with renal and ureteral calculous obstruction: Secondary | ICD-10-CM | POA: Diagnosis not present

## 2021-12-13 DIAGNOSIS — D631 Anemia in chronic kidney disease: Secondary | ICD-10-CM | POA: Diagnosis present

## 2021-12-13 DIAGNOSIS — B965 Pseudomonas (aeruginosa) (mallei) (pseudomallei) as the cause of diseases classified elsewhere: Secondary | ICD-10-CM | POA: Diagnosis present

## 2021-12-13 DIAGNOSIS — F418 Other specified anxiety disorders: Secondary | ICD-10-CM | POA: Diagnosis not present

## 2021-12-13 DIAGNOSIS — R627 Adult failure to thrive: Secondary | ICD-10-CM | POA: Diagnosis present

## 2021-12-13 DIAGNOSIS — W06XXXA Fall from bed, initial encounter: Secondary | ICD-10-CM | POA: Diagnosis present

## 2021-12-13 DIAGNOSIS — Z86718 Personal history of other venous thrombosis and embolism: Secondary | ICD-10-CM | POA: Diagnosis not present

## 2021-12-13 DIAGNOSIS — K219 Gastro-esophageal reflux disease without esophagitis: Secondary | ICD-10-CM | POA: Diagnosis present

## 2021-12-13 DIAGNOSIS — E43 Unspecified severe protein-calorie malnutrition: Secondary | ICD-10-CM | POA: Diagnosis present

## 2021-12-13 DIAGNOSIS — I825Y9 Chronic embolism and thrombosis of unspecified deep veins of unspecified proximal lower extremity: Secondary | ICD-10-CM | POA: Diagnosis not present

## 2021-12-13 DIAGNOSIS — E213 Hyperparathyroidism, unspecified: Secondary | ICD-10-CM | POA: Diagnosis present

## 2021-12-13 DIAGNOSIS — N1831 Chronic kidney disease, stage 3a: Secondary | ICD-10-CM | POA: Diagnosis present

## 2021-12-13 DIAGNOSIS — F32A Depression, unspecified: Secondary | ICD-10-CM | POA: Diagnosis present

## 2021-12-13 DIAGNOSIS — Z888 Allergy status to other drugs, medicaments and biological substances status: Secondary | ICD-10-CM | POA: Diagnosis not present

## 2021-12-13 DIAGNOSIS — Z66 Do not resuscitate: Secondary | ICD-10-CM | POA: Diagnosis present

## 2021-12-13 DIAGNOSIS — R32 Unspecified urinary incontinence: Secondary | ICD-10-CM

## 2021-12-13 DIAGNOSIS — Z8616 Personal history of COVID-19: Secondary | ICD-10-CM | POA: Diagnosis not present

## 2021-12-13 DIAGNOSIS — Z20822 Contact with and (suspected) exposure to covid-19: Secondary | ICD-10-CM | POA: Diagnosis present

## 2021-12-13 LAB — COMPREHENSIVE METABOLIC PANEL
ALT: 8 U/L (ref 0–44)
AST: 6 U/L — ABNORMAL LOW (ref 15–41)
Albumin: 2.3 g/dL — ABNORMAL LOW (ref 3.5–5.0)
Alkaline Phosphatase: 46 U/L (ref 38–126)
Anion gap: 7 (ref 5–15)
BUN: 27 mg/dL — ABNORMAL HIGH (ref 8–23)
CO2: 24 mmol/L (ref 22–32)
Calcium: 9.6 mg/dL (ref 8.9–10.3)
Chloride: 111 mmol/L (ref 98–111)
Creatinine, Ser: 1.16 mg/dL — ABNORMAL HIGH (ref 0.44–1.00)
GFR, Estimated: 50 mL/min — ABNORMAL LOW (ref >60)
Glucose, Bld: 111 mg/dL — ABNORMAL HIGH (ref 70–99)
Potassium: 3.5 mmol/L (ref 3.5–5.1)
Sodium: 142 mmol/L (ref 135–145)
Total Bilirubin: 0.4 mg/dL (ref 0.3–1.2)
Total Protein: 5.6 g/dL — ABNORMAL LOW (ref 6.5–8.1)

## 2021-12-13 LAB — CBC
HCT: 26.3 % — ABNORMAL LOW (ref 36.0–46.0)
Hemoglobin: 8.4 g/dL — ABNORMAL LOW (ref 12.0–15.0)
MCH: 26.8 pg (ref 26.0–34.0)
MCHC: 31.9 g/dL (ref 30.0–36.0)
MCV: 83.8 fL (ref 80.0–100.0)
Platelets: 234 10*3/uL (ref 150–400)
RBC: 3.14 MIL/uL — ABNORMAL LOW (ref 3.87–5.11)
RDW: 13.8 % (ref 11.5–15.5)
WBC: 7.4 10*3/uL (ref 4.0–10.5)
nRBC: 0 % (ref 0.0–0.2)

## 2021-12-13 MED ORDER — APIXABAN 5 MG PO TABS
5.0000 mg | ORAL_TABLET | Freq: Two times a day (BID) | ORAL | Status: DC
Start: 2021-12-13 — End: 2021-12-17
  Administered 2021-12-13 – 2021-12-17 (×8): 5 mg via ORAL
  Filled 2021-12-13 (×8): qty 1

## 2021-12-13 MED ORDER — ONDANSETRON HCL 4 MG/2ML IJ SOLN
4.0000 mg | Freq: Four times a day (QID) | INTRAMUSCULAR | Status: DC | PRN
Start: 2021-12-13 — End: 2021-12-17
  Administered 2021-12-13 – 2021-12-15 (×2): 4 mg via INTRAVENOUS
  Filled 2021-12-13 (×2): qty 2

## 2021-12-13 MED ORDER — SODIUM CHLORIDE 0.9 % IV SOLN
2.0000 g | INTRAVENOUS | Status: DC
  Administered 2021-12-13 – 2021-12-14 (×2): 2 g via INTRAVENOUS
  Filled 2021-12-13 (×3): qty 20

## 2021-12-13 MED ORDER — ORAL CARE MOUTH RINSE: 15.0000 mL | OROMUCOSAL | Status: DC | PRN

## 2021-12-13 MED ORDER — ADULT MULTIVITAMIN W/MINERALS CH
1.0000 | ORAL_TABLET | Freq: Every day | ORAL | Status: DC
  Administered 2021-12-14 – 2021-12-17 (×4): 1 via ORAL
  Filled 2021-12-13 (×4): qty 1

## 2021-12-13 MED ORDER — BOOST / RESOURCE BREEZE PO LIQD CUSTOM
1.0000 | Freq: Three times a day (TID) | ORAL | Status: DC
  Administered 2021-12-14: 1 via ORAL

## 2021-12-13 MED ORDER — LIDOCAINE 5 % EX PTCH
1.0000 | MEDICATED_PATCH | CUTANEOUS | Status: DC
Start: 2021-12-13 — End: 2021-12-17
  Administered 2021-12-13 – 2021-12-17 (×5): 1 via TRANSDERMAL
  Filled 2021-12-13 (×5): qty 1

## 2021-12-13 NOTE — Progress Notes (Addendum)
Mobility Specialist - Progress Note   12/13/21 1500  Mobility  Activity Ambulated with assistance in room;Turned to right side;Turned to left side  Level of Assistance Standby assist, set-up cues, supervision of patient - no hands on  Assistive Device Front wheel walker  Distance Ambulated (ft) 15 ft  Activity Response Tolerated well  $Mobility charge 1 Mobility     Pt lying in bed upon arrival, utilizing RA. "I can't get up" but agreeable to attempt with minimal motivation. Pt able to sit EOB modI; STS and ambulate with supervision. Reports feeling really ill during session. Pt reports having chills and is warm to touch. Temperature taken under L arm reading at 100.1. RN notified. Pt rolled L/R for bed linen change and peri-care. Pt left in bed with alarm set, needs in reach.    Kathee Delton Mobility Specialist 12/13/21, 3:08 PM

## 2021-12-13 NOTE — Progress Notes (Signed)
Surgical Physician [facility]  Dr. Erlene Quan * Scheduling expectation :  2-3 weeks  *Length of Case:   *Clearance needed: no  *Anticoagulation Instructions:  On Eliquis for chronic DVT, continued for 1 more month by Dr. Posey Pronto on 11/21/2021. TBD, may be off by the time of surgery.   *Aspirin Instructions: N/A  *Post-op visit Date/Instructions:  1 month with RUS prior  *Diagnosis: Right Ureteral Stone  *Procedure: right  Ureteroscopy w/laser lithotripsy & stent exchange ([id number])   Additional orders: N/A  -Admit type: OUTpatient  -Anesthesia: General  -VTE Prophylaxis Standing Order SCD's       Other:   -Standing Lab Orders Per Anesthesia    Lab other: UA&Urine Culture  -Standing Test orders EKG/Chest x-ray per Anesthesia       Test other:   - Medications:  Ancef 1gm IV  -Other orders:  N/A

## 2021-12-13 NOTE — Progress Notes (Signed)
I spoke with the patient's daughter, [name], via telephone this afternoon.  We discussed her mother's need for follow-up definitive stone management in 2 to 3 weeks.  Ms. Amber [name] would like to keep her mother's urologic care with Dr. Erlene Quan in our practice.  OR orders placed, our surgical scheduler [name] will contact her to arrange her surgery.

## 2021-12-13 NOTE — Anesthesia Postprocedure Evaluation (Signed)
Anesthesia Post Note  Patient: Amber [name]  Procedure(s) Performed: CYSTOSCOPY WITH RETROGRADE PYELOGRAM/URETERAL STENT PLACEMENT (Right)  Patient location during evaluation: PACU Anesthesia Type: General Level of consciousness: awake and alert Pain management: pain level controlled Vital Signs Assessment: post-procedure vital signs reviewed and stable Respiratory status: spontaneous breathing, nonlabored ventilation, respiratory function stable and patient connected to nasal cannula oxygen Cardiovascular status: blood pressure returned to baseline and stable Postop Assessment: no apparent nausea or vomiting Anesthetic complications: no   No notable events documented.   Last Vitals:  Vitals:   12/12/21 2314 12/13/21 0451  BP: 127/78 128/73  Pulse: 67 60  Resp: 16 16  Temp: (!) 36.4 C 36.8 C  SpO2: 98% 97%    Last Pain:  Vitals:   12/12/21 2314  TempSrc: Oral  PainSc:                  Martha Clan

## 2021-12-13 NOTE — Progress Notes (Signed)
Urology Inpatient Progress Note  Subjective: She is s/p right ureteral stent placement with Dr. Erlene Quan.  He is afebrile, VSS this morning. WBC count down today, 7.4.  Creatinine down today, 1.16.  Urine cultures pending, on antibiotics as below. Pure wick in place draining red-brown urine. She reports right flank pain following a recent fall that is no worse or different since stent placement. Her daughter makes her medical decisions and will determine where they wish to pursue follow-up stone management procedure.  Anti-infectives: Anti-infectives (From admission, onward)    Start     Dose/Rate Route Frequency Ordered Stop   12/13/21 1000  cefTRIAXone (ROCEPHIN) 2 g in sodium chloride 0.9 % 100 mL IVPB        2 g 200 mL/hr over 30 Minutes Intravenous Every 24 hours 12/13/21 0757     12/13/21 0600  ceFEPIme (MAXIPIME) 1 g in sodium chloride 0.9 % 100 mL IVPB        1 g 200 mL/hr over 30 Minutes Intravenous On call to O.R. 12/12/21 1753 12/13/21 0556   12/12/21 1600  cefTRIAXone (ROCEPHIN) 1 g in sodium chloride 0.9 % 100 mL IVPB        1 g 200 mL/hr over 30 Minutes Intravenous  Once 12/12/21 1556 12/12/21 1719       Current Facility-Administered Medications  Medication Dose Route Frequency Provider Last Rate Last Admin   acetaminophen (TYLENOL) tablet 650 mg  650 mg Oral Q6H PRN Marcelyn Bruins, MD       Or   acetaminophen (TYLENOL) suppository 650 mg  650 mg Rectal Q6H PRN Marcelyn Bruins, MD       ALPRAZolam Duanne Moron) tablet 0.25 mg  0.25 mg Oral BID PRN Marcelyn Bruins, MD       busPIRone (BUSPAR) tablet 5 mg  5 mg Oral q AM Marcelyn Bruins, MD   5 mg at 12/13/21 1610   cefTRIAXone (ROCEPHIN) 2 g in sodium chloride 0.9 % 100 mL IVPB  2 g Intravenous Q24H Nolberto Hanlon, MD       FLUoxetine (PROZAC) capsule 40 mg  40 mg Oral Q breakfast Marcelyn Bruins, MD       methimazole (TAPAZOLE) tablet 5 mg  5 mg Oral Daily Marcelyn Bruins, MD       mirtazapine  (REMERON) tablet 15 mg  15 mg Oral QHS Marcelyn Bruins, MD   15 mg at 12/12/21 2309   OLANZapine (ZYPREXA) tablet 7.5 mg  7.5 mg Oral QHS Marcelyn Bruins, MD   7.5 mg at 12/12/21 2309   ondansetron (ZOFRAN) injection 4 mg  4 mg Intravenous Q6H PRN Nolberto Hanlon, MD       Oral care mouth rinse  15 mL Mouth Rinse PRN Marcelyn Bruins, MD       polyethylene glycol (MIRALAX / GLYCOLAX) packet 17 g  17 g Oral Daily PRN Marcelyn Bruins, MD       sodium chloride flush (NS) 0.9 % injection 3 mL  3 mL Intravenous Q12H Marcelyn Bruins, MD         Objective: Vital signs in last 24 hours: Temp:  [97.5 F (36.4 C)-99.7 F (37.6 C)] 97.6 F (36.4 C) (07/20 0742) Pulse Rate:  [60-77] 65 (07/20 0742) Resp:  [16-31] 31 (07/20 0742) BP: (117-158)/(57-82) 144/78 (07/20 0742) SpO2:  [97 %-100 %] 99 % (07/20 0742) Weight:  [59.9 kg] 59.9 kg (07/19 1510)  Intake/Output from previous day: 07/19 0701 -  07/20 0700 In: 900 [I.V.:800; IV Piggyback:100] Out: -  Intake/Output this shift: No intake/output data recorded.  Physical Exam Vitals and nursing note reviewed.  Constitutional:      General: She is not in acute distress.    Appearance: She is not ill-appearing, toxic-appearing or diaphoretic.  HENT:     Head: Normocephalic and atraumatic.  Pulmonary:     Effort: Pulmonary effort is normal. No respiratory distress.  Skin:    General: Skin is warm and dry.  Neurological:     Mental Status: She is alert and oriented to person, place, and time.  Psychiatric:        Mood and Affect: Mood is anxious.    Lab Results:  Recent Labs    12/12/21 1515 12/13/21 0512  WBC 13.4* 7.4  HGB 9.3* 8.4*  HCT 29.9* 26.3*  PLT 295 234   BMET Recent Labs    12/12/21 1515 12/13/21 0512  NA 140 142  K 2.9* 3.5  CL 109 111  CO2 24 24  GLUCOSE 142* 111*  BUN 24* 27*  CREATININE 1.28* 1.16*  CALCIUM 9.8 9.6   Assessment & Plan: 76 year old female with PMH microscopic hematuria and  recurrent UTIs s/p right ureteral stent placement with Dr. Erlene Quan for management of 2 obstructing proximal right ureteral calculi in the setting of UTI, now on Rocephin.  With her recent history of Pseudomonas UTI, recommending broadening her empiric antibiotic coverage from ceftriaxone to cefepime while we await urine culture results.  We discussed that she will require definitive stone management in 1 to 2 weeks after completion of culture appropriate antibiotics.  I will attempt to contact her daughter later today to determine where she wishes to have this performed.  We discussed common stent symptoms including flank pain, bladder pain, dysuria, urgency, frequency, urge incontinence, and gross hematuria.  It does appear she is having some gross hematuria today.  Given that her right flank pain is no worse or different since stent placement, I think she is tolerating her stent well and we can defer Flomax or oxybutynin for now.  Recommendations: -Broaden empiric antibiotic coverage as above, follow cultures.  She will require a total of 10 to 14 days of culture appropriate therapy. -Outpatient ureteroscopy with laser lithotripsy and stent exchange, site to be determined  Debroah Loop, PA-C 12/13/2021

## 2021-12-13 NOTE — Progress Notes (Signed)
Initial Nutrition Assessment  DOCUMENTATION CODES:   Severe malnutrition in context of social or environmental circumstances  INTERVENTION:   Boost Breeze po TID, each supplement provides 250 kcal and 9 grams of protein  MVI po daily   Dysphagia 3 diet   Pt at high refeed risk; recommend monitor potassium, magnesium and phosphorus labs daily until stable  NUTRITION DIAGNOSIS:   Severe Malnutrition related to social / environmental circumstances as evidenced by moderate fat depletion, severe muscle depletion, 15 percent weight loss in 6 months.  GOAL:   Patient will meet greater than or equal to 90% of their needs  MONITOR:   PO intake, Supplement acceptance, Labs, Weight trends, Skin, I & O's  REASON FOR ASSESSMENT:   Malnutrition Screening Tool    ASSESSMENT:   76 y/o female with h/o HTN, depression, anxiety, CVA, C. diff colitis, CKD III and DVT who is admitted with hydronephrosis with obstructing renal stone now s/p stent placement 7/19.  Met with pt in room today. Pt reports poor appetite and oral intake for several months pta. Per chart review, pt with MDD and has been having ECT treatments since January. Per chart, pt is down 25lbs(15%) over the past 6 months; this is significant weight loss. RD discussed with pt the importance of adequate nutrition needed to preserve lean muscle. Pt reports that she does not like Boost or Ensure supplements. Pt also reports that she does not like yogurt, cottage cheese, ice cream, sandwiches or crackers. Pt reports that's she is willing to try mixed berry Boost Breeze. Pt is documented to have eaten 30% of her breakfast this morning. Pt is at refeed risk.   Medications reviewed and include: tapazole, remeron, ceftriaxone   Labs reviewed: K 3.5 wnl, BUN 27(H), creat 1.16(H) Hgb 8.4(L), Hct 26.3(L)  NUTRITION - FOCUSED PHYSICAL EXAM:  Flowsheet Row Most Recent Value  Orbital Region Mild depletion  Upper Arm Region Moderate  depletion  Thoracic and Lumbar Region Mild depletion  Buccal Region Moderate depletion  Temple Region Moderate depletion  Clavicle Bone Region Moderate depletion  Clavicle and Acromion Bone Region Moderate depletion  Scapular Bone Region Moderate depletion  Dorsal Hand Moderate depletion  Patellar Region Severe depletion  Anterior Thigh Region Severe depletion  Posterior Calf Region Severe depletion  Edema (RD Assessment) None  Hair Reviewed  Eyes Reviewed  Mouth Reviewed  Skin Reviewed  Nails Reviewed   Diet Order:   Diet Order             DIET DYS 3 Room service appropriate? Yes; Fluid consistency: Thin  Diet effective now                  EDUCATION NEEDS:   Education needs have been addressed  Skin:  Skin Assessment: Reviewed RN Assessment  Last BM:  PTA  Height:   Ht Readings from Last 1 Encounters:  12/12/21 _0  (1.676 m)    Weight:   Wt Readings from Last 1 Encounters:  12/12/21 59.9 kg    Ideal Body Weight:  59 kg  BMI:  Body mass index is 21.31 kg/m.  Estimated Nutritional Needs:   Kcal:  1600-1800kcal/day  Protein:  80-90g/day  Fluid:  1.5-1.7L/day  Koleen Distance MS, RD, LDN Please refer to [facility] for RD and/or RD on-call/weekend/after hours pager

## 2021-12-13 NOTE — Plan of Care (Signed)
  Problem: Activity: Goal: Risk for activity intolerance will decrease Outcome: Progressing   Problem: Nutrition: Goal: Adequate nutrition will be maintained Outcome: Progressing   Problem: Coping: Goal: Level of anxiety will decrease Outcome: Progressing   Problem: Elimination: Goal: Will not experience complications related to bowel motility Outcome: Progressing   Problem: Pain Managment: Goal: General experience of comfort will improve Outcome: Progressing   

## 2021-12-13 NOTE — Progress Notes (Signed)
PROGRESS NOTE    Amber [name]  HGD:[id number] DOB: 1945-07-25 DOA: 12/12/2021 PCP: Lindell Spar, MD    Brief Narrative:  Amber [name] is a 76 y.o. female with medical history significant of incontinence, depression, anxiety, hypothyroidism, abnormal gait, GERD, hyperparathyroidism, chronic DVT history of PE, CKD 3A, anemia, thoracic aortic aneurysm, stroke presenting with lethargy, decreased p.o. intake and depression as well as some abdominal pain and nausea.  7/20 s/p Rt ureteral stent placement by Dr. Erlene Quan on 7/19.  She has nausea today  Consultants:  urology  Procedures: rt retrograde pyelogram  Antimicrobials:  ceftriaxone   Subjective:  C/o rt back pain. Some nausea. No vomiting. No abd pain  Objective: Vitals:   12/12/21 2036 12/12/21 2314 12/13/21 0451 12/13/21 0742  BP: (!) 117/57 127/78 128/73 (!) 144/78  Pulse: 68 67 60 65  Resp: '18 16 16 '$ (!) 31  Temp: (!) 97.5 F (36.4 C) (!) 97.5 F (36.4 C) 98.3 F (36.8 C) 97.6 F (36.4 C)  TempSrc: Oral Oral  Oral  SpO2: 97% 98% 97% 99%  Weight:      Height:        Intake/Output Summary (Last 24 hours) at 12/13/2021 0757 Last data filed at 12/12/2021 1852 Gross per 24 hour  Intake 900 ml  Output --  Net 900 ml   Filed Weights   12/12/21 1510  Weight: 59.9 kg    Examination: Calm, NAD Cta no w/r Reg s1/s2 no gallop Soft benign +bs No edema Grossly intact Mood and affect appropriate in current setting     Data Reviewed: I have personally reviewed following labs and imaging studies  CBC: Recent Labs  Lab 12/12/21 1515 12/13/21 0512  WBC 13.4* 7.4  NEUTROABS 12.3*  --   HGB 9.3* 8.4*  HCT 29.9* 26.3*  MCV 85.4 83.8  PLT 295 962   Basic Metabolic Panel: Recent Labs  Lab 12/12/21 1515 12/12/21 1530 12/13/21 0512  NA 140  --  142  K 2.9*  --  3.5  CL 109  --  111  CO2 24  --  24  GLUCOSE 142*  --  111*  BUN 24*  --  27*  CREATININE 1.28*  --  1.16*  CALCIUM 9.8  --  9.6  MG  --   1.9  --    GFR: Estimated Creatinine Clearance: 40.4 mL/min (A) (by C-G formula based on SCr of 1.16 mg/dL (H)). Liver Function Tests: Recent Labs  Lab 12/12/21 1515 12/13/21 0512  AST 12* 6*  ALT 9 8  ALKPHOS 58 46  BILITOT 0.9 0.4  PROT 6.6 5.6*  ALBUMIN 2.8* 2.3*   No results for input(s): "LIPASE", "AMYLASE" in the last 168 hours. Recent Labs  Lab 12/12/21 1515  AMMONIA 17   Coagulation Profile: No results for input(s): "INR", "PROTIME" in the last 168 hours. Cardiac Enzymes: No results for input(s): "CKTOTAL", "CKMB", "CKMBINDEX", "TROPONINI" in the last 168 hours. BNP (last 3 results) No results for input(s): "PROBNP" in the last 8760 hours. HbA1C: No results for input(s): "HGBA1C" in the last 72 hours. CBG: No results for input(s): "GLUCAP" in the last 168 hours. Lipid Profile: No results for input(s): "CHOL", "HDL", "LDLCALC", "TRIG", "CHOLHDL", "LDLDIRECT" in the last 72 hours. Thyroid Function Tests: Recent Labs    12/12/21 1515  TSH 0.060*  FREET4 1.16*   Anemia Panel: No results for input(s): "VITAMINB12", "FOLATE", "FERRITIN", "TIBC", "IRON", "RETICCTPCT" in the last 72 hours. Sepsis Labs: Recent Labs  Lab  12/12/21 1530  PROCALCITON 0.12    Recent Results (from the past 240 hour(s))  SARS Coronavirus 2 by RT PCR (hospital order, performed in [facility] hospital lab) *cepheid single result test* Anterior Nasal Swab     Status: None   Collection Time: 12/12/21  4:43 PM   Specimen: Anterior Nasal Swab  Result Value Ref Range Status   SARS Coronavirus 2 by RT PCR NEGATIVE NEGATIVE Final    Comment: (NOTE) SARS-CoV-2 target nucleic acids are NOT DETECTED.  The SARS-CoV-2 RNA is generally detectable in upper and lower respiratory specimens during the acute phase of infection. The lowest concentration of SARS-CoV-2 viral copies this assay can detect is 250 copies / mL. A negative result does not preclude SARS-CoV-2 infection and should not be  used as the sole basis for treatment or other patient management decisions.  A negative result may occur with improper specimen collection / handling, submission of specimen other than nasopharyngeal swab, presence of viral mutation(s) within the areas targeted by this assay, and inadequate number of viral copies (<250 copies / mL). A negative result must be combined with clinical observations, patient history, and epidemiological information.  Fact Sheet for Patients:   https://www.patel.info/  Fact Sheet for Healthcare Providers: https://hall.com/  This test is not yet approved or  cleared by the [place] FDA and has been authorized for detection and/or diagnosis of SARS-CoV-2 by FDA under an Emergency Use Authorization (EUA).  This EUA will remain in effect (meaning this test can be used) for the duration of the COVID-19 declaration under Section 564(b)(1) of the Act, 21 U.S.C. section 360bbb-3(b)(1), unless the authorization is terminated or revoked sooner.  Performed at [facility], [address] [place], [place] [address]          Radiology Studies: DG OR UROLOGY CYSTO IMAGE ([facility])  Result Date: 12/12/2021 There is no interpretation for this exam.  This order is for images obtained during a surgical procedure.  Please See "Surgeries" Tab for more information regarding the procedure.   DG Shoulder Right  Result Date: 12/12/2021 CLINICAL DATA:  Fall.  Weakness.  Failure to thrive. EXAM: RIGHT SHOULDER - 2+ VIEW COMPARISON:  PET-CT 11/08/2021, AP chest 06/27/2021 FINDINGS: There is diffuse decreased bone mineralization. Mild inferior glenoid degenerative osteophytosis. No acute fracture or dislocation. Small sclerotic focus within the slightly medial aspect of the humeral head is unchanged from 06/27/2021 chest radiograph. IMPRESSION: No acute fracture. Electronically Signed   By: Yvonne Kendall M.D.   On:  12/12/2021 16:59   DG Chest 2 View  Result Date: 12/12/2021 CLINICAL DATA:  Weakness. EXAM: CHEST - 2 VIEW COMPARISON:  June 27, 2021 FINDINGS: Tortuosity and calcific atherosclerotic disease of the aorta. Cardiomediastinal silhouette is normal. Mediastinal contours appear intact. There is no evidence of focal airspace consolidation, pleural effusion or pneumothorax. Osseous structures are without acute abnormality. Soft tissues are grossly normal. IMPRESSION: No active cardiopulmonary disease. Electronically Signed   By: Fidela Salisbury M.D.   On: 12/12/2021 16:55   CT ABDOMEN PELVIS W CONTRAST  Result Date: 12/12/2021 CLINICAL DATA:  Acute nonlocalized abdominal pain. EXAM: CT ABDOMEN AND PELVIS WITH CONTRAST TECHNIQUE: Multidetector CT imaging of the abdomen and pelvis was performed using the standard protocol following bolus administration of intravenous contrast. RADIATION DOSE REDUCTION: This exam was performed according to the departmental dose-optimization program which includes automated exposure control, adjustment of the mA and/or kV according to patient size and/or use of iterative reconstruction technique. CONTRAST:  69m OMNIPAQUE IOHEXOL 300 MG/ML  SOLN COMPARISON:  June 14, 2021 FINDINGS: Lower chest: Enlarged heart. Calcific atherosclerotic disease of the coronary arteries. Hepatobiliary: Left liver cyst. Normal appearance of the gallbladder. Pancreas: Unremarkable. No pancreatic ductal dilatation or surrounding inflammatory changes. Spleen: Normal in size without focal abnormality. Adrenals/Urinary Tract: Normal adrenal glands. Partially exophytic left renal cyst, stable. Again seen is obstructing ureteral stone in the right UPJ measuring 1.1 cm. A second 8 mm calculus is located within the proximal right ureter slightly distal to the UPJ where the ureter has a reverse S shaped configuration. There is right perinephric fat stranding. Stomach/Bowel: Stomach is within normal limits.  Appendix appears normal. No evidence of bowel wall thickening, distention, or inflammatory changes. Vascular/Lymphatic: Aortic atherosclerosis. No enlarged abdominal or pelvic lymph nodes. 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 Coarse calcifications within the right adnexa. Other: No abdominal wall hernia or abnormality. No abdominopelvic ascites. Musculoskeletal: No acute or significant osseous findings. IMPRESSION: 1. Two obstructing ureteral calculi in the right UPJ and proximal right ureter, with reverse S shaped configuration of the proximal right ureter. Resultant severe right hydronephrosis and right perinephric stranding. 2. 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 3. Enlarged heart. 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 Aortic Atherosclerosis (ICD10-I70.0). Electronically Signed   By: DFidela SalisburyM.D.   On: 12/12/2021 16:40   CT HEAD WO CONTRAST (5MM)  Result Date: 12/12/2021 CLINICAL DATA:  Altered mental status. EXAM: CT HEAD WITHOUT CONTRAST CT CERVICAL SPINE WITHOUT CONTRAST TECHNIQUE: Multidetector CT imaging of the head and cervical spine was performed following the standard protocol without intravenous contrast. Multiplanar CT image reconstructions of the cervical spine were also generated. RADIATION DOSE REDUCTION: This exam was performed according to the departmental dose-optimization program which includes automated exposure control, adjustment of the mA and/or kV according to patient size and/or use of iterative reconstruction technique. COMPARISON:  June 08, 2021. FINDINGS: CT HEAD FINDINGS Brain: No evidence of acute infarction, hemorrhage, hydrocephalus, extra-axial collection or mass  lesion/mass effect. Vascular: No hyperdense vessel or unexpected calcification. Skull: Normal. Negative for fracture or focal lesion. Sinuses/Orbits: No acute finding. Other: None. CT CERVICAL SPINE FINDINGS Alignment: Mild grade 1 anterolisthesis of C4-5 is noted secondary to posterior facet joint hypertrophy. Skull base and vertebrae: No acute fracture. No primary bone lesion or focal pathologic process. Soft tissues and spinal canal: No prevertebral fluid or swelling. No visible canal hematoma. Disc levels: Moderate degenerative disc disease is noted at C5-6 and C6-7. Upper chest: Negative. Other: None. IMPRESSION: No acute intracranial abnormality seen. Moderate multilevel degenerative disc disease. No acute abnormality seen in the cervical spine. Electronically Signed   By: JMarijo ConceptionM.D.   On: 12/12/2021 16:34   CT Cervical Spine Wo Contrast  Result Date: 12/12/2021 CLINICAL DATA:  Altered mental status. EXAM: CT HEAD WITHOUT CONTRAST CT CERVICAL SPINE WITHOUT CONTRAST TECHNIQUE: Multidetector CT imaging of the head and cervical spine was performed following the standard protocol without intravenous contrast. Multiplanar CT image reconstructions of the cervical spine were also generated. RADIATION DOSE REDUCTION: This exam was performed according to the departmental dose-optimization program which includes automated exposure control, adjustment of the mA and/or kV according to patient size and/or use of iterative reconstruction technique. COMPARISON:  June 08, 2021. FINDINGS: CT HEAD  FINDINGS Brain: No evidence of acute infarction, hemorrhage, hydrocephalus, extra-axial collection or mass lesion/mass effect. Vascular: No hyperdense vessel or unexpected calcification. Skull: Normal. Negative for fracture or focal lesion. Sinuses/Orbits: No acute finding. Other: None. CT CERVICAL SPINE FINDINGS Alignment: Mild grade 1 anterolisthesis of C4-5 is noted secondary to posterior facet joint hypertrophy.  Skull base and vertebrae: No acute fracture. No primary bone lesion or focal pathologic process. Soft tissues and spinal canal: No prevertebral fluid or swelling. No visible canal hematoma. Disc levels: Moderate degenerative disc disease is noted at C5-6 and C6-7. Upper chest: Negative. Other: None. IMPRESSION: No acute intracranial abnormality seen. Moderate multilevel degenerative disc disease. No acute abnormality seen in the cervical spine. Electronically Signed   By: Marijo Conception M.D.   On: 12/12/2021 16:34        Scheduled Meds:  busPIRone  5 mg Oral q AM   FLUoxetine  40 mg Oral Q breakfast   methimazole  5 mg Oral Daily   mirtazapine  15 mg Oral QHS   OLANZapine  7.5 mg Oral QHS   sodium chloride flush  3 mL Intravenous Q12H   Continuous Infusions:  cefTRIAXone (ROCEPHIN)  IV      Assessment & Plan:   Principal Problem:   UTI (urinary tract infection) Active Problems:   Urinary incontinence   Depression with anxiety   Hyperthyroidism   Chronic deep vein thrombosis (DVT) (HCC)   Stage 3a chronic kidney disease (HCC)   Normocytic anemia   Hydronephrosis with obstructing calculus   Hypokalemia   UTI Hydronephrosis with obstructing renal stone S/o rt ureteral stent Urology following Continue iv abx F/u ucx Lidocaine patch for Rt side pain  Depression Anxiety > Has had history of severe depression, has required ECT in the past. - Continue home BuSpar, Fluxid teen, mirtazapine, olanzapine 7/20 continue xanax prn    Hypothyroidism Continue methamizole  History of DVT and PE - Holding home Eliquis periprocedurally 7/20Will ask urology if clear to resume it  Hypokalemia CKD 3A > Creatinine remained stable at 1.28 in the ED.  Potassium low at 2.9, magnesium normal. > Received 40 mEq p.o. potassium and 10 mEq IV potassium in the ED. stable - Avoid nephrotoxic agents  Anemia Monitor Transfuse if hg7 or less   DVT prophylaxis: scd Code  Status:full Family Communication: none at bedside Disposition Plan:  Status [id number] Patient is inpatient as she requires IV treatment, has nausea.         LOS: 0 days   Time spent: 35 min    Nolberto Hanlon, MD [facility] Pager [phone]  If 7PM-7AM, please contact night-coverage 12/13/2021, 7:57 AM

## 2021-12-14 DIAGNOSIS — E43 Unspecified severe protein-calorie malnutrition: Secondary | ICD-10-CM | POA: Insufficient documentation

## 2021-12-14 DIAGNOSIS — N132 Hydronephrosis with renal and ureteral calculous obstruction: Secondary | ICD-10-CM | POA: Diagnosis not present

## 2021-12-14 DIAGNOSIS — N390 Urinary tract infection, site not specified: Secondary | ICD-10-CM | POA: Diagnosis not present

## 2021-12-14 DIAGNOSIS — I825Y9 Chronic embolism and thrombosis of unspecified deep veins of unspecified proximal lower extremity: Secondary | ICD-10-CM | POA: Diagnosis not present

## 2021-12-14 DIAGNOSIS — F418 Other specified anxiety disorders: Secondary | ICD-10-CM | POA: Diagnosis not present

## 2021-12-14 LAB — URINE CULTURE
Culture: >=100000 — AB
Culture: >=100000 — AB

## 2021-12-14 LAB — HEMOGLOBIN AND HEMATOCRIT, BLOOD
HCT: 25.7 % — ABNORMAL LOW (ref 36.0–46.0)
Hemoglobin: 8.4 g/dL — ABNORMAL LOW (ref 12.0–15.0)

## 2021-12-14 NOTE — Progress Notes (Signed)
Encourage patient to sit up in chair, patient refused activity at this time stating, "I cannot do it right now, I just can't."  Explained to patient benefits of mobility.  Will continue to encourage patient.

## 2021-12-14 NOTE — Progress Notes (Addendum)
PROGRESS NOTE    Amber [name]  YNW:[id number] DOB: 1945/08/28 DOA: 12/12/2021 PCP: Lindell Spar, MD    Brief Narrative:  Amber [name] is a 76 y.o. female with medical history significant of incontinence, depression, anxiety, hypothyroidism, abnormal gait, GERD, hyperparathyroidism, chronic DVT history of PE, CKD 3A, anemia, thoracic aortic aneurysm, stroke presenting with lethargy, decreased p.o. intake and depression as well as some abdominal pain and nausea.  7/20 s/p Rt ureteral stent placement by Dr. Erlene Quan on 7/19.  She has nausea today 7/21 c/o rt side/back pain. Feels lidocaine patch not helping much.not much appetitie  Consultants:  urology  Procedures: rt retrograde pyelogram  Antimicrobials:  ceftriaxone   Subjective: No n/v/abd pain  Objective: Vitals:   12/13/21 1541 12/13/21 1943 12/14/21 0316 12/14/21 0834  BP: (!) 156/86 130/73 137/72 136/77  Pulse: 88 74 80 79  Resp: '18 16 16 16  '$ Temp: 99.8 F (37.7 C) 98.2 F (36.8 C) 98.8 F (37.1 C) 98.6 F (37 C)  TempSrc:  Oral Oral   SpO2: 98% 95% 95% 98%  Weight:      Height:        Intake/Output Summary (Last 24 hours) at 12/14/2021 1443 Last data filed at 12/14/2021 1431 Gross per 24 hour  Intake 1340 ml  Output 750 ml  Net 590 ml   Filed Weights   12/12/21 1510  Weight: 59.9 kg    Examination: Calm, NAD, frail Cta no w/r Reg s1/s2 no gallop Soft benign +bs No edema Aaoxox3  Mood and affect appropriate in current setting     Data Reviewed: I have personally reviewed following labs and imaging studies  CBC: Recent Labs  Lab 12/12/21 1515 12/13/21 0512 12/14/21 0504  WBC 13.4* 7.4  --   NEUTROABS 12.3*  --   --   HGB 9.3* 8.4* 8.4*  HCT 29.9* 26.3* 25.7*  MCV 85.4 83.8  --   PLT 295 234  --    Basic Metabolic Panel: Recent Labs  Lab 12/12/21 1515 12/12/21 1530 12/13/21 0512  NA 140  --  142  K 2.9*  --  3.5  CL 109  --  111  CO2 24  --  24  GLUCOSE 142*  --  111*   BUN 24*  --  27*  CREATININE 1.28*  --  1.16*  CALCIUM 9.8  --  9.6  MG  --  1.9  --    GFR: Estimated Creatinine Clearance: 40.4 mL/min (A) (by C-G formula based on SCr of 1.16 mg/dL (H)). Liver Function Tests: Recent Labs  Lab 12/12/21 1515 12/13/21 0512  AST 12* 6*  ALT 9 8  ALKPHOS 58 46  BILITOT 0.9 0.4  PROT 6.6 5.6*  ALBUMIN 2.8* 2.3*   No results for input(s): "LIPASE", "AMYLASE" in the last 168 hours. Recent Labs  Lab 12/12/21 1515  AMMONIA 17   Coagulation Profile: No results for input(s): "INR", "PROTIME" in the last 168 hours. Cardiac Enzymes: No results for input(s): "CKTOTAL", "CKMB", "CKMBINDEX", "TROPONINI" in the last 168 hours. BNP (last 3 results) No results for input(s): "PROBNP" in the last 8760 hours. HbA1C: No results for input(s): "HGBA1C" in the last 72 hours. CBG: No results for input(s): "GLUCAP" in the last 168 hours. Lipid Profile: No results for input(s): "CHOL", "HDL", "LDLCALC", "TRIG", "CHOLHDL", "LDLDIRECT" in the last 72 hours. Thyroid Function Tests: Recent Labs    12/12/21 1515  TSH 0.060*  FREET4 1.16*   Anemia Panel: No results for  input(s): "VITAMINB12", "FOLATE", "FERRITIN", "TIBC", "IRON", "RETICCTPCT" in the last 72 hours. Sepsis Labs: Recent Labs  Lab 12/12/21 1530  PROCALCITON 0.12    Recent Results (from the past 240 hour(s))  Urine Culture     Status: Abnormal (Preliminary result)   Collection Time: 12/12/21  3:15 PM   Specimen: Urine, Clean Catch  Result Value Ref Range Status   Specimen Description   Final    URINE, CLEAN CATCH Performed at [facility], [address] [place], [place] [address]    Special Requests   Final    NONE Performed at [facility], [address] [place], [place] [address]    Culture (A)  Final    >=100,000 COLONIES/mL GRAM NEGATIVE RODS IDENTIFICATION AND SUSCEPTIBILITIES TO FOLLOW Performed at [facility], [place] [address], [place], [place]  [address]    Report Status PENDING  Incomplete  SARS Coronavirus 2 by RT PCR (hospital order, performed in [facility] hospital lab) *cepheid single result test* Anterior Nasal Swab     Status: None   Collection Time: 12/12/21  4:43 PM   Specimen: Anterior Nasal Swab  Result Value Ref Range Status   SARS Coronavirus 2 by RT PCR NEGATIVE NEGATIVE Final    Comment: (NOTE) SARS-CoV-2 target nucleic acids are NOT DETECTED.  The SARS-CoV-2 RNA is generally detectable in upper and lower respiratory specimens during the acute phase of infection. The lowest concentration of SARS-CoV-2 viral copies this assay can detect is 250 copies / mL. A negative result does not preclude SARS-CoV-2 infection and should not be used as the sole basis for treatment or other patient management decisions.  A negative result may occur with improper specimen collection / handling, submission of specimen other than nasopharyngeal swab, presence of viral mutation(s) within the areas targeted by this assay, and inadequate number of viral copies (<250 copies / mL). A negative result must be combined with clinical observations, patient history, and epidemiological information.  Fact Sheet for Patients:   https://www.patel.info/  Fact Sheet for Healthcare Providers: https://hall.com/  This test is not yet approved or  cleared by the [place] FDA and has been authorized for detection and/or diagnosis of SARS-CoV-2 by FDA under an Emergency Use Authorization (EUA).  This EUA will remain in effect (meaning this test can be used) for the duration of the COVID-19 declaration under Section 564(b)(1) of the Act, 21 U.S.C. section 360bbb-3(b)(1), unless the authorization is terminated or revoked sooner.  Performed at [facility], [address], [place], [place] [address]   Urine Culture     Status: Abnormal (Preliminary result)   Collection Time: 12/12/21  6:43 PM    Specimen: Urine, Random  Result Value Ref Range Status   Specimen Description URINE, RANDOM  Final   Special Requests RENAL PELVIS URINE  Final   Culture (A)  Final    >=100,000 COLONIES/mL GRAM NEGATIVE RODS IDENTIFICATION AND SUSCEPTIBILITIES TO FOLLOW Performed at [facility], [address], [place], [place] [address]    Report Status PENDING  Incomplete         Radiology Studies: DG OR UROLOGY CYSTO IMAGE ([place])  Result Date: 12/12/2021 There is no interpretation for this exam.  This order is for images obtained during a surgical procedure.  Please See "Surgeries" Tab for more information regarding the procedure.   DG Shoulder Right  Result Date: 12/12/2021 CLINICAL DATA:  Fall.  Weakness.  Failure to thrive. EXAM: RIGHT SHOULDER -  2+ VIEW COMPARISON:  PET-CT 11/08/2021, AP chest 06/27/2021 FINDINGS: There is diffuse decreased bone mineralization. Mild inferior glenoid degenerative osteophytosis. No acute fracture or dislocation. Small sclerotic focus within the slightly medial aspect of the humeral head is unchanged from 06/27/2021 chest radiograph. IMPRESSION: No acute fracture. Electronically Signed   By: Yvonne Kendall M.D.   On: 12/12/2021 16:59   DG Chest 2 View  Result Date: 12/12/2021 CLINICAL DATA:  Weakness. EXAM: CHEST - 2 VIEW COMPARISON:  June 27, 2021 FINDINGS: Tortuosity and calcific atherosclerotic disease of the aorta. Cardiomediastinal silhouette is normal. Mediastinal contours appear intact. There is no evidence of focal airspace consolidation, pleural effusion or pneumothorax. Osseous structures are without acute abnormality. Soft tissues are grossly normal. IMPRESSION: No active cardiopulmonary disease. Electronically Signed   By: Fidela Salisbury M.D.   On: 12/12/2021 16:55   CT ABDOMEN PELVIS W CONTRAST  Result Date: 12/12/2021 CLINICAL DATA:  Acute nonlocalized abdominal pain. EXAM: CT ABDOMEN AND PELVIS WITH CONTRAST TECHNIQUE:  Multidetector CT imaging of the abdomen and pelvis was performed using the standard protocol following bolus administration of intravenous contrast. RADIATION DOSE REDUCTION: This exam was performed according to the departmental dose-optimization program which includes automated exposure control, adjustment of the mA and/or kV according to patient size and/or use of iterative reconstruction technique. CONTRAST:  66m OMNIPAQUE IOHEXOL 300 MG/ML  SOLN COMPARISON:  June 14, 2021 FINDINGS: Lower chest: Enlarged heart. Calcific atherosclerotic disease of the coronary arteries. Hepatobiliary: Left liver cyst. Normal appearance of the gallbladder. Pancreas: Unremarkable. No pancreatic ductal dilatation or surrounding inflammatory changes. Spleen: Normal in size without focal abnormality. Adrenals/Urinary Tract: Normal adrenal glands. Partially exophytic left renal cyst, stable. Again seen is obstructing ureteral stone in the right UPJ measuring 1.1 cm. A second 8 mm calculus is located within the proximal right ureter slightly distal to the UPJ where the ureter has a reverse S shaped configuration. There is right perinephric fat stranding. Stomach/Bowel: Stomach is within normal limits. Appendix appears normal. No evidence of bowel wall thickening, distention, or inflammatory changes. Vascular/Lymphatic: Aortic atherosclerosis. No enlarged abdominal or pelvic lymph nodes. 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 Coarse calcifications within the right adnexa. Other: No abdominal wall hernia or abnormality. No abdominopelvic ascites. Musculoskeletal: No acute or significant osseous findings. IMPRESSION: 1. Two obstructing ureteral calculi in the right UPJ and proximal right ureter, with reverse S shaped configuration of the proximal right ureter. Resultant  severe right hydronephrosis and right perinephric stranding. 2. 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 3. Enlarged heart. 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 Aortic Atherosclerosis (ICD10-I70.0). Electronically Signed   By: DFidela SalisburyM.D.   On: 12/12/2021 16:40   CT HEAD WO CONTRAST (5MM)  Result Date: 12/12/2021 CLINICAL DATA:  Altered mental status. EXAM: CT HEAD WITHOUT CONTRAST CT CERVICAL SPINE WITHOUT CONTRAST TECHNIQUE: Multidetector CT imaging of the head and cervical spine was performed following the standard protocol without intravenous contrast. Multiplanar CT image reconstructions of the cervical spine were also generated. RADIATION DOSE REDUCTION: This exam was performed according to the departmental dose-optimization program which includes automated exposure control, adjustment of the mA and/or kV according to patient size and/or use of iterative reconstruction technique. COMPARISON:  June 08, 2021. FINDINGS: CT HEAD FINDINGS Brain: No evidence of acute infarction,  hemorrhage, hydrocephalus, extra-axial collection or mass lesion/mass effect. Vascular: No hyperdense vessel or unexpected calcification. Skull: Normal. Negative for fracture or focal lesion. Sinuses/Orbits: No acute finding. Other: None. CT CERVICAL SPINE FINDINGS Alignment: Mild grade 1 anterolisthesis of C4-5 is noted secondary to posterior facet joint hypertrophy. Skull base and vertebrae: No acute fracture. No primary bone lesion or focal pathologic process. Soft tissues and spinal canal: No prevertebral fluid or swelling. No visible canal hematoma. Disc levels: Moderate degenerative disc disease is noted at C5-6 and C6-7. Upper chest: Negative. Other: None. IMPRESSION: No acute intracranial abnormality seen. Moderate multilevel degenerative disc disease. No acute abnormality seen in the cervical  spine. Electronically Signed   By: Marijo Conception M.D.   On: 12/12/2021 16:34   CT Cervical Spine Wo Contrast  Result Date: 12/12/2021 CLINICAL DATA:  Altered mental status. EXAM: CT HEAD WITHOUT CONTRAST CT CERVICAL SPINE WITHOUT CONTRAST TECHNIQUE: Multidetector CT imaging of the head and cervical spine was performed following the standard protocol without intravenous contrast. Multiplanar CT image reconstructions of the cervical spine were also generated. RADIATION DOSE REDUCTION: This exam was performed according to the departmental dose-optimization program which includes automated exposure control, adjustment of the mA and/or kV according to patient size and/or use of iterative reconstruction technique. COMPARISON:  June 08, 2021. FINDINGS: CT HEAD FINDINGS Brain: No evidence of acute infarction, hemorrhage, hydrocephalus, extra-axial collection or mass lesion/mass effect. Vascular: No hyperdense vessel or unexpected calcification. Skull: Normal. Negative for fracture or focal lesion. Sinuses/Orbits: No acute finding. Other: None. CT CERVICAL SPINE FINDINGS Alignment: Mild grade 1 anterolisthesis of C4-5 is noted secondary to posterior facet joint hypertrophy. Skull base and vertebrae: No acute fracture. No primary bone lesion or focal pathologic process. Soft tissues and spinal canal: No prevertebral fluid or swelling. No visible canal hematoma. Disc levels: Moderate degenerative disc disease is noted at C5-6 and C6-7. Upper chest: Negative. Other: None. IMPRESSION: No acute intracranial abnormality seen. Moderate multilevel degenerative disc disease. No acute abnormality seen in the cervical spine. Electronically Signed   By: Marijo Conception M.D.   On: 12/12/2021 16:34        Scheduled Meds:  apixaban  5 mg Oral BID   busPIRone  5 mg Oral q AM   feeding supplement  1 Container Oral TID BM   FLUoxetine  40 mg Oral Q breakfast   lidocaine  1 patch Transdermal Q24H   methimazole  5 mg Oral  Daily   mirtazapine  15 mg Oral QHS   multivitamin with minerals  1 tablet Oral Daily   OLANZapine  7.5 mg Oral QHS   sodium chloride flush  3 mL Intravenous Q12H   Continuous Infusions:  cefTRIAXone (ROCEPHIN)  IV 2 g (12/14/21 0942)    Assessment & Plan:   Principal Problem:   UTI (urinary tract infection) Active Problems:   Urinary incontinence   Depression with anxiety   Hyperthyroidism   Chronic deep vein thrombosis (DVT) (HCC)   Stage 3a chronic kidney disease (HCC)   Normocytic anemia   Hydronephrosis with obstructing calculus   Hypokalemia   Protein-calorie malnutrition, severe   UTI Hydronephrosis with obstructing renal stone S/o rt ureteral stent Urology following Continue iv abx Lidocaine patch for Rt side pain 7/21 used to follow-up with urology for definite stone management in 2 to 3 weeks.  They will contact her Culture with Pseudomonas   Depression Anxiety > Has had history of severe depression, has required ECT in the past. -  Continue home BuSpar, Fluxid teen, mirtazapine, olanzapine 7/21 continue xanax prn     Hypothyroidism Continue methamizole   History of DVT and PE - Holding home Eliquis periprocedurally 7/21 continue eliquis.   Hypokalemia CKD 3A > Creatinine remained stable at 1.28 in the ED.  Potassium low at 2.9, magnesium normal. > Received 40 mEq p.o. potassium and 10 mEq IV potassium in the ED. stable - Avoid nephrotoxic agents 7/21 h/h stable  Anemia Monitor Transfuse if hg7 or less 7/21 h/h stable  Severe malnutrition Moderate fat depletion Severe muscle depletion, 15% wt loss in 6 month Supplements Encouraged po intake   DVT prophylaxis: eliquis Code Status:full Family Communication: none at bedside Disposition Plan:  Status KD:[id number] Patient is inpatient as she requires IV treatment, has nausea. Weak         LOS: 1 day   Time spent: 35 min    Nolberto Hanlon, MD [facility] Pager [phone]  [phone]  If 7PM-7AM, please contact night-coverage 12/14/2021, 2:43 PM

## 2021-12-15 DIAGNOSIS — N132 Hydronephrosis with renal and ureteral calculous obstruction: Secondary | ICD-10-CM | POA: Diagnosis not present

## 2021-12-15 DIAGNOSIS — I825Y9 Chronic embolism and thrombosis of unspecified deep veins of unspecified proximal lower extremity: Secondary | ICD-10-CM | POA: Diagnosis not present

## 2021-12-15 DIAGNOSIS — F418 Other specified anxiety disorders: Secondary | ICD-10-CM | POA: Diagnosis not present

## 2021-12-15 DIAGNOSIS — N390 Urinary tract infection, site not specified: Secondary | ICD-10-CM | POA: Diagnosis not present

## 2021-12-15 MED ORDER — SODIUM CHLORIDE 0.9 % IV SOLN
2.0000 g | Freq: Two times a day (BID) | INTRAVENOUS | Status: DC
  Administered 2021-12-15 – 2021-12-17 (×6): 2 g via INTRAVENOUS
  Filled 2021-12-15: qty 2
  Filled 2021-12-15 (×2): qty 12.5
  Filled 2021-12-15: qty 2
  Filled 2021-12-15 (×2): qty 12.5

## 2021-12-15 NOTE — Evaluation (Signed)
Physical Therapy Evaluation Patient Details Name: Amber [name] MRN: [id number] DOB: 1945/08/13 Today's Date: 12/15/2021  History of Present Illness  Amber [name] is a 76 y.o. female with medical history significant of incontinence, depression, anxiety, hypothyroidism, abnormal gait, GERD, hyperparathyroidism, chronic DVT history of PE, CKD 3A, anemia, thoracic aortic aneurysm, stroke presenting with lethargy, decreased p.o. intake and depression as well as some abdominal pain and nausea.    Patient has had worsening lethargy and family concern for depressive symptoms for 2 to 3 weeks.  Patient says these are stable.  But for the last few days she is also had even worsening weakness and decreased p.o. intake.  She has associated nausea and abdominal pain however this appears to been going on longer the last few days.  She also reports of having rolled out of her bed a couple days ago and landing on her shoulder but not hitting her head.  Additionally reports dark urine but denies dysuria.  Also reporting generalized weakness as above. Pt underwent cystoscopy with retrograde pyelogram/urethral stent placement.   Clinical Impression  Pt received in bed with nurse by her side. Pt agreed to participate in PT evaluation after setting the goals with pt about the level of activity needed to complete the evaluation. Pt refused to get OOB to chair. Pt participated din bed mobility with CGA ot Min assist, STS with min assist with RW, Side stepping along the bed side with RW with Min assist of 1. Pt made comfortable in bed with all needs within in reach and bed alarm activated. Pt may benefit form Psych consult to improve compliance and carryover. Pt will benefit form SNF to improve strength balance and activity participation at household level in order to return to PLOF and safe return home with family.       Recommendations for follow up therapy are one component of a multi-disciplinary discharge planning process,  led by the attending physician.  Recommendations may be updated based on patient status, additional functional criteria and insurance authorization.  Follow Up Recommendations Skilled nursing-short term rehab (<3 hours/day)      Assistance Recommended at Discharge Intermittent Supervision/Assistance  Patient can return home with the following  A little help with walking and/or transfers;A lot of help with bathing/dressing/bathroom;Assistance with cooking/housework;Direct supervision/assist for medications management;Direct supervision/assist for financial management;Assist for transportation;Help with stairs or ramp for entrance    Equipment Recommendations Rolling walker (2 wheels);BSC/3in1  Recommendations for Other Services       Functional Status Assessment Patient has had a recent decline in their functional status and demonstrates the ability to make significant improvements in function in a reasonable and predictable amount of time.     Precautions / Restrictions Precautions Precautions: Fall Restrictions Weight Bearing Restrictions: No      Mobility  Bed Mobility Overal bed mobility: Needs Assistance Bed Mobility: Supine to Sit, Sit to Supine     Supine to sit: Min assist Sit to supine: Min assist        Transfers Overall transfer level: Needs assistance Equipment used: Rolling walker (2 wheels) Transfers: Sit to/from Stand Sit to Stand: Min assist                Ambulation/Gait Ambulation/Gait assistance: Min Web designer (Feet): 7 Feet Assistive device: Rolling walker (2 wheels) Gait Pattern/deviations: Shuffle (side steping) Gait velocity: decreased        Stairs: Pt in appropriate at this point  Wheelchair Mobility    Modified Rankin (Stroke Patients Only)       Balance Overall balance assessment: Needs assistance Sitting-balance support: Bilateral upper extremity supported, Feet supported Sitting balance-Leahy  Scale: Fair     Standing balance support: Bilateral upper extremity supported Standing balance-Leahy Scale: Fair                               Pertinent Vitals/Pain Pain Assessment Pain Assessment: No/denies pain    Home Living Family/patient expects to be discharged to:: Private residence Living Arrangements: Spouse/significant other Available Help at Discharge: Family Type of Home: House Home Access: Stairs to enter       Home Layout: One level        Prior Function Prior Level of Function : Needs assist;Patient poor historian/Family not available   Mobility (Cognitive):  (unable to  assess due to pt poor historain.)   Physical Assist : Mobility (physical);ADLs (physical)   ADLs (physical): Bathing;Dressing Mobility Comments: patient is a poor historian. patient reports she has not ambulated in a while due to weakness and numbness in BLE ADLs Comments: States she stopped using a bedside commode for toileting when she "couldn't get back up from it"     Hand Dominance        Extremity/Trunk Assessment   Upper Extremity Assessment Upper Extremity Assessment: Overall WFL for tasks assessed    Lower Extremity Assessment Lower Extremity Assessment: Generalized weakness       Communication   Communication: No difficulties  Cognition Arousal/Alertness: Awake/alert Behavior During Therapy: Anxious, Flat affect Overall Cognitive Status: No family/caregiver present to determine baseline cognitive functioning                                          General Comments      Exercises     Assessment/Plan    PT Assessment Patient needs continued PT services  PT Problem List Decreased strength;Decreased activity tolerance;Decreased balance;Decreased mobility;Decreased coordination;Decreased safety awareness       PT Treatment Interventions Gait training;Stair training;Functional mobility training;Therapeutic activities;Therapeutic  exercise;Balance training;Neuromuscular re-education;Cognitive remediation;Patient/family education    PT Goals (Current goals can be found in the Care Plan section)  Acute Rehab PT Goals PT Goal Formulation: Patient unable to participate in goal setting Time For Goal Achievement: 12/29/21 Potential to Achieve Goals: Good    Frequency Min 2X/week     Co-evaluation               AM-PAC PT "6 Clicks" Mobility  Outcome Measure Help needed turning from your back to your side while in a flat bed without using bedrails?: A Little Help needed moving from lying on your back to sitting on the side of a flat bed without using bedrails?: A Little Help needed moving to and from a bed to a chair (including a wheelchair)?: A Lot Help needed standing up from a chair using your arms (e.g., wheelchair or bedside chair)?: A Little Help needed to walk in hospital room?: A Lot Help needed climbing 3-5 steps with a railing? : Total 6 Click Score: 14    End of Session Equipment Utilized During Treatment: Gait belt Activity Tolerance: Patient tolerated treatment well (limited due to cognitive impairments.) Patient left: in bed;with call bell/phone within reach;with bed alarm set Nurse Communication: Mobility status (needs  for psych  consult) PT Visit Diagnosis: Unsteadiness on feet (R26.81);Other abnormalities of gait and mobility (R26.89);Muscle weakness (generalized) (M62.81);Difficulty in walking, not elsewhere classified (R26.2)    Time: 1594-5859 PT Time Calculation (min) (ACUTE ONLY): 15 min   Charges:   PT Evaluation $PT Eval Moderate Complexity: 1 Mod PT Treatments $Therapeutic Activity: 8-22 mins       Emery Dupuy PT DPT 12:32 PM,12/15/21

## 2021-12-15 NOTE — Progress Notes (Signed)
PROGRESS NOTE    Amber [name]  WEX:[id number] DOB: 1946/02/05 DOA: 12/12/2021 PCP: Lindell Spar, MD    Brief Narrative:  Amber [name] is a 76 y.o. female with medical history significant of incontinence, depression, anxiety, hypothyroidism, abnormal gait, GERD, hyperparathyroidism, chronic DVT history of PE, CKD 3A, anemia, thoracic aortic aneurysm, stroke presenting with lethargy, decreased p.o. intake and depression as well as some abdominal pain and nausea.  7/20 s/p Rt ureteral stent placement by Dr. Erlene Quan on 7/19.  She has nausea today 7/21 c/o rt side/back pain. Feels lidocaine patch not helping much.not much appetitie 7/22 reports feeling better.  PT recommends SNF Consultants:  urology  Procedures: rt retrograde pyelogram  Antimicrobials:  ceftriaxone   Subjective: Right flank pain/back little better today.  She was hurting earlier  Objective: Vitals:   12/14/21 1552 12/14/21 2024 12/15/21 0427 12/15/21 0809  BP: (!) 158/82 128/75 138/84 (!) 159/86  Pulse: 71 86 79 79  Resp: '18 18 20 18  '$ Temp: 98.6 F (37 C) 98.3 F (36.8 C) 97.9 F (36.6 C) 98.1 F (36.7 C)  TempSrc:   Oral Oral  SpO2: 99% 96% 97% 95%  Weight:      Height:        Intake/Output Summary (Last 24 hours) at 12/15/2021 0828 Last data filed at 12/15/2021 0500 Gross per 24 hour  Intake 900 ml  Output 300 ml  Net 600 ml   Filed Weights   12/12/21 1510  Weight: 59.9 kg    Examination: Calm, NAD Cta no w/r Reg s1/s2 no gallop Soft benign +bs No edema Grossly intact Mood and affect appropriate in current setting     Data Reviewed: I have personally reviewed following labs and imaging studies  CBC: Recent Labs  Lab 12/12/21 1515 12/13/21 0512 12/14/21 0504  WBC 13.4* 7.4  --   NEUTROABS 12.3*  --   --   HGB 9.3* 8.4* 8.4*  HCT 29.9* 26.3* 25.7*  MCV 85.4 83.8  --   PLT 295 234  --    Basic Metabolic Panel: Recent Labs  Lab 12/12/21 1515 12/12/21 1530 12/13/21 0512   NA 140  --  142  K 2.9*  --  3.5  CL 109  --  111  CO2 24  --  24  GLUCOSE 142*  --  111*  BUN 24*  --  27*  CREATININE 1.28*  --  1.16*  CALCIUM 9.8  --  9.6  MG  --  1.9  --    GFR: Estimated Creatinine Clearance: 40.4 mL/min (A) (by C-G formula based on SCr of 1.16 mg/dL (H)). Liver Function Tests: Recent Labs  Lab 12/12/21 1515 12/13/21 0512  AST 12* 6*  ALT 9 8  ALKPHOS 58 46  BILITOT 0.9 0.4  PROT 6.6 5.6*  ALBUMIN 2.8* 2.3*   No results for input(s): "LIPASE", "AMYLASE" in the last 168 hours. Recent Labs  Lab 12/12/21 1515  AMMONIA 17   Coagulation Profile: No results for input(s): "INR", "PROTIME" in the last 168 hours. Cardiac Enzymes: No results for input(s): "CKTOTAL", "CKMB", "CKMBINDEX", "TROPONINI" in the last 168 hours. BNP (last 3 results) No results for input(s): "PROBNP" in the last 8760 hours. HbA1C: No results for input(s): "HGBA1C" in the last 72 hours. CBG: No results for input(s): "GLUCAP" in the last 168 hours. Lipid Profile: No results for input(s): "CHOL", "HDL", "LDLCALC", "TRIG", "CHOLHDL", "LDLDIRECT" in the last 72 hours. Thyroid Function Tests: Recent Labs  12/12/21 1515  TSH 0.060*  FREET4 1.16*   Anemia Panel: No results for input(s): "VITAMINB12", "FOLATE", "FERRITIN", "TIBC", "IRON", "RETICCTPCT" in the last 72 hours. Sepsis Labs: Recent Labs  Lab 12/12/21 1530  PROCALCITON 0.12    Recent Results (from the past 240 hour(s))  Urine Culture     Status: Abnormal   Collection Time: 12/12/21  3:15 PM   Specimen: Urine, Clean Catch  Result Value Ref Range Status   Specimen Description   Final    URINE, CLEAN CATCH Performed at [facility], [address], [place], [place] [address]    Special Requests   Final    NONE Performed at [facility], [place], [place] [address]    Culture >=100,000 COLONIES/mL PSEUDOMONAS AERUGINOSA (A)  Final   Report Status 12/14/2021 FINAL  Final    Organism ID, Bacteria PSEUDOMONAS AERUGINOSA (A)  Final      Susceptibility   Pseudomonas aeruginosa - MIC*    CEFTAZIDIME <=1 SENSITIVE Sensitive     CIPROFLOXACIN <=0.25 SENSITIVE Sensitive     GENTAMICIN <=1 SENSITIVE Sensitive     IMIPENEM <=0.25 SENSITIVE Sensitive     PIP/TAZO <=4 SENSITIVE Sensitive     CEFEPIME 2 SENSITIVE Sensitive     * >=100,000 COLONIES/mL PSEUDOMONAS AERUGINOSA  SARS Coronavirus 2 by RT PCR (hospital order, performed in [place] hospital lab) *cepheid single result test* Anterior Nasal Swab     Status: None   Collection Time: 12/12/21  4:43 PM   Specimen: Anterior Nasal Swab  Result Value Ref Range Status   SARS Coronavirus 2 by RT PCR NEGATIVE NEGATIVE Final    Comment: (NOTE) SARS-CoV-2 target nucleic acids are NOT DETECTED.  The SARS-CoV-2 RNA is generally detectable in upper and lower respiratory specimens during the acute phase of infection. The lowest concentration of SARS-CoV-2 viral copies this assay can detect is 250 copies / mL. A negative result does not preclude SARS-CoV-2 infection and should not be used as the sole basis for treatment or other patient management decisions.  A negative result may occur with improper specimen collection / handling, submission of specimen other than nasopharyngeal swab, presence of viral mutation(s) within the areas targeted by this assay, and inadequate number of viral copies (<250 copies / mL). A negative result must be combined with clinical observations, patient history, and epidemiological information.  Fact Sheet for Patients:   https://www.patel.info/  Fact Sheet for Healthcare Providers: https://hall.com/  This test is not yet approved or  cleared by the [place] FDA and has been authorized for detection and/or diagnosis of SARS-CoV-2 by FDA under an Emergency Use Authorization (EUA).  This EUA will remain in effect (meaning this test can be  used) for the duration of the COVID-19 declaration under Section 564(b)(1) of the Act, 21 U.S.C. section 360bbb-3(b)(1), unless the authorization is terminated or revoked sooner.  Performed at [facility], [address] [place], [place] [address]   Urine Culture     Status: Abnormal   Collection Time: 12/12/21  6:43 PM   Specimen: Urine, Random  Result Value Ref Range Status   Specimen Description URINE, RANDOM  Final   Special Requests   Final    RENAL PELVIS URINE Performed at [facility], [place] [address], [place], [place] [address]    Culture >=100,000 COLONIES/mL PSEUDOMONAS AERUGINOSA (A)  Final   Report Status 12/14/2021 FINAL  Final   Organism ID, Bacteria PSEUDOMONAS AERUGINOSA (A)  Final  Susceptibility   Pseudomonas aeruginosa - MIC*    CEFTAZIDIME 2 SENSITIVE Sensitive     CIPROFLOXACIN <=0.25 SENSITIVE Sensitive     GENTAMICIN <=1 SENSITIVE Sensitive     IMIPENEM <=0.25 SENSITIVE Sensitive     PIP/TAZO <=4 SENSITIVE Sensitive     CEFEPIME 2 SENSITIVE Sensitive     * >=100,000 COLONIES/mL PSEUDOMONAS AERUGINOSA         Radiology Studies: No results found.      Scheduled Meds:  apixaban  5 mg Oral BID   busPIRone  5 mg Oral q AM   feeding supplement  1 Container Oral TID BM   FLUoxetine  40 mg Oral Q breakfast   lidocaine  1 patch Transdermal Q24H   methimazole  5 mg Oral Daily   mirtazapine  15 mg Oral QHS   multivitamin with minerals  1 tablet Oral Daily   OLANZapine  7.5 mg Oral QHS   sodium chloride flush  3 mL Intravenous Q12H   Continuous Infusions:  ceFEPime (MAXIPIME) IV      Assessment & Plan:   Principal Problem:   UTI (urinary tract infection) Active Problems:   Urinary incontinence   Depression with anxiety   Hyperthyroidism   Chronic deep vein thrombosis (DVT) (HCC)   Stage 3a chronic kidney disease (HCC)   Normocytic anemia   Hydronephrosis with obstructing calculus   Hypokalemia   Protein-calorie  malnutrition, severe   UTI Hydronephrosis with obstructing renal stone S/o rt ureteral stent Urology following Continue iv abx Lidocaine patch for Rt side pain 7/21 used to follow-up with urology for definite stone management in 2 to 3 weeks.  They will contact her 7/22 urine culture with Pseudomonas, change to cefepime based on sensitivity   Depression Anxiety > Has had history of severe depression, has required ECT in the past. - Continue home BuSpar, Fluxid teen, mirtazapine, olanzapine 7/22 continue Xanax as needed        Hypothyroidism Continue methimazole     History of DVT and PE - Holding home Eliquis periprocedurally 7/22 continue Eliquis     Hypokalemia CKD 3A > Creatinine remained stable at 1.28 in the ED.  Potassium low at 2.9, magnesium normal. > Received 40 mEq p.o. potassium and 10 mEq IV potassium in the ED. stable - Avoid nephrotoxic agents 7/21 h/h stable  Anemia Monitor Transfuse if hg7 or less 7/21 h/h stable  Severe malnutrition Moderate fat depletion Severe muscle depletion, 15% wt loss in 6 month Supplements Encouraged po intake   DVT prophylaxis: eliquis Code Status:full Family Communication: none at bedside Disposition Plan: SNF pending Status [id number] Patient is inpatient as she requires IV treatment, needs sbf        LOS: 2 days   Time spent: 35 min    Nolberto Hanlon, MD [facility] Pager [phone]  If 7PM-7AM, please contact night-coverage 12/15/2021, 8:28 AM

## 2021-12-16 DIAGNOSIS — I825Y9 Chronic embolism and thrombosis of unspecified deep veins of unspecified proximal lower extremity: Secondary | ICD-10-CM | POA: Diagnosis not present

## 2021-12-16 DIAGNOSIS — N132 Hydronephrosis with renal and ureteral calculous obstruction: Secondary | ICD-10-CM | POA: Diagnosis not present

## 2021-12-16 DIAGNOSIS — N390 Urinary tract infection, site not specified: Secondary | ICD-10-CM | POA: Diagnosis not present

## 2021-12-16 DIAGNOSIS — F418 Other specified anxiety disorders: Secondary | ICD-10-CM | POA: Diagnosis not present

## 2021-12-16 MED ORDER — SODIUM CHLORIDE 0.9 % IV SOLN: INTRAVENOUS | Status: AC

## 2021-12-16 NOTE — TOC Initial Note (Signed)
Transition of Care [facility]) - Initial/Assessment Note    Patient Details  Name: Amber [name] MRN: [id number] Date of Birth: 14-Sep-1945  Transition of Care [facility]) CM/SW Contact:    Beverly Sessions, RN Phone Number: 12/16/2021, 10:57 AM  Clinical Narrative:                   Admitted for: UTI Admitted from: Home wit husband PCP: Posey Pronto - daughter transports to appointments Current home health/prior home health/DME: RW, [facility], transport chair and [facility]  Therapy recommending SNF.  Patient defers me to speak with daughter Amber [name].   Amber [name] states that she feels like the patient would do better in her own home, and would like home health services through [place].   Referral made to Meg with [employer]  Daughter to transport at discharge    Expected Discharge Plan: [place] Barriers to Discharge: Continued Medical Work up   Patient Goals and CMS Choice        Expected Discharge Plan and Services Expected Discharge Plan: [place]   Discharge Planning Services: CM Consult   Living arrangements for the past 2 months: Single Family Home                           HH Arranged: PT, OT, Nurse's Aide HH Agency: [place] [facility] Contacted: 12/16/21   Representative spoke with at [place]: Meg  Prior Living Arrangements/Services Living arrangements for the past 2 months: [place] with:: Spouse   Do you feel safe going back to the place where you live?: Yes          Current home services: DME    Activities of Daily Living Home Assistive Devices/Equipment: Walker (specify type) ADL Screening (condition at time of admission) Patient's cognitive ability adequate to safely complete daily activities?: No Is the patient deaf or have difficulty hearing?: No Does the patient have difficulty seeing, even when wearing glasses/contacts?: No Does the patient have difficulty concentrating, remembering, or making  decisions?: Yes Patient able to express need for assistance with ADLs?: Yes Does the patient have difficulty dressing or bathing?: Yes Independently performs ADLs?: No Communication: Independent Dressing (OT): Independent with device (comment) Grooming: Independent Feeding: Independent Bathing: Needs assistance Is this a change from baseline?: Change from baseline, expected to last <3 days Toileting: Independent In/Out Bed: Independent Walks in Home: Independent Does the patient have difficulty walking or climbing stairs?: Yes Weakness of Legs: Left Weakness of Arms/Hands: None  Permission Sought/Granted                  Emotional Assessment       Orientation: : Oriented to Self, Oriented to Place, Oriented to  Time, Oriented to Situation Alcohol / Substance Use: Not Applicable Psych Involvement: No (comment)  Admission diagnosis:  Hypokalemia [E87.6] Kidney stone [N20.0] UTI (urinary tract infection) [N39.0] Weakness [R53.1] Pyelonephritis [N12] Acute cystitis with hematuria [N30.01] Troponin I above reference range [R77.8] Patient Active Problem List   Diagnosis Date Noted   Protein-calorie malnutrition, severe 12/14/2021   Hydronephrosis with obstructing calculus 12/12/2021   UTI (urinary tract infection) 12/12/2021   Hypokalemia 12/12/2021   At risk for prolonged QT interval syndrome 12/10/2021   Chronic idiopathic constipation 11/21/2021   Multinodular goiter 11/07/2021   Thoracic aortic aneurysm ([place]) 10/23/2021   MDD (major depressive disorder), recurrent, severe, with psychosis ([place]) 09/25/2021   High  risk medication use 09/25/2021   Thyroid nodule 09/18/2021   Pulmonary embolus ([place]) 08/16/2021   GAD (generalized anxiety disorder) 08/16/2021   Normocytic anemia 08/16/2021   Leg swelling 08/16/2021   Idiopathic peripheral neuropathy 08/16/2021   Sinus tachycardia 07/14/2021   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   Chronic deep vein thrombosis (DVT)  ([place]) 06/19/2021   Stage 3a chronic kidney disease ([place]) 06/19/2021   Gross hematuria 06/19/2021   Dysphagia    Primary hyperparathyroidism ([place]) 06/07/2021   Hyperthyroidism 04/25/2021   Encounter for examination following treatment at hospital 04/25/2021   Gastroesophageal reflux disease 04/25/2021   Gait abnormality 03/29/2021   Urinary incontinence 03/29/2021   Moderate protein-calorie malnutrition ([place]) 03/29/2021   Depression with anxiety 03/29/2021   Aortic atherosclerosis ([place]) 03/29/2021   Adnexal mass 03/29/2021   PCP:  Lindell Spar, MD Pharmacy:   [facility] [place] [address] Phone: [phone] Fax: [phone]     Social Determinants of Health (SDOH) Interventions    Readmission Risk Interventions     No data to display

## 2021-12-16 NOTE — Progress Notes (Signed)
PROGRESS NOTE    Amber [name]  KKX:[id number] DOB: [birth year] DOA: 12/12/2021 PCP: Lindell Spar, MD    Brief Narrative:  Amber [name] is a 76 y.o. female with medical history significant of incontinence, depression, anxiety, hypothyroidism, abnormal gait, GERD, hyperparathyroidism, chronic DVT history of PE, CKD 3A, anemia, thoracic aortic aneurysm, stroke presenting with lethargy, decreased p.o. intake and depression as well as some abdominal pain and nausea.  7/20 s/p Rt ureteral stent placement by Dr. Erlene Quan on 7/19.  She has nausea today 7/21 c/o rt side/back pain. Feels lidocaine patch not helping much.not much appetitie 7/22 reports feeling better.  PT recommends SNF 7/23 does not want to participate with PT today dark clear-colored urine and canister   Consultants:  urology  Procedures: rt retrograde pyelogram  Antimicrobials:  ceftriaxone   Subjective: Still with pain, but less. Not much appetite today  Objective: Vitals:   12/15/21 1614 12/15/21 1947 12/16/21 0347 12/16/21 0803  BP: (!) 142/79 (!) 147/78 (!) 160/82 (!) 156/87  Pulse: 81 76 76 76  Resp: '18 18 16 18  '$ Temp: 98.7 F (37.1 C) 98.2 F (36.8 C) 98 F (36.7 C) 97.7 F (36.5 C)  TempSrc:    Oral  SpO2: 97% 98% 94% 95%  Weight:      Height:        Intake/Output Summary (Last 24 hours) at 12/16/2021 1448 Last data filed at 12/16/2021 0700 Gross per 24 hour  Intake 0 ml  Output 1100 ml  Net -1100 ml   Filed Weights   12/12/21 1510  Weight: 59.9 kg    Examination: Calm, NAD Cta no w/r Reg s1/s2 no gallop Soft benign +bs No edema Grossly intact Mood and affect appropriate in current setting     Data Reviewed: I have personally reviewed following labs and imaging studies  CBC: Recent Labs  Lab 12/12/21 1515 12/13/21 0512 12/14/21 0504  WBC 13.4* 7.4  --   NEUTROABS 12.3*  --   --   HGB 9.3* 8.4* 8.4*  HCT 29.9* 26.3* 25.7*  MCV 85.4 83.8  --   PLT 295 234  --    Basic  Metabolic Panel: Recent Labs  Lab 12/12/21 1515 12/12/21 1530 12/13/21 0512  NA 140  --  142  K 2.9*  --  3.5  CL 109  --  111  CO2 24  --  24  GLUCOSE 142*  --  111*  BUN 24*  --  27*  CREATININE 1.28*  --  1.16*  CALCIUM 9.8  --  9.6  MG  --  1.9  --    GFR: Estimated Creatinine Clearance: 40.4 mL/min (A) (by C-G formula based on SCr of 1.16 mg/dL (H)). Liver Function Tests: Recent Labs  Lab 12/12/21 1515 12/13/21 0512  AST 12* 6*  ALT 9 8  ALKPHOS 58 46  BILITOT 0.9 0.4  PROT 6.6 5.6*  ALBUMIN 2.8* 2.3*   No results for input(s): "LIPASE", "AMYLASE" in the last 168 hours. Recent Labs  Lab 12/12/21 1515  AMMONIA 17   Coagulation Profile: No results for input(s): "INR", "PROTIME" in the last 168 hours. Cardiac Enzymes: No results for input(s): "CKTOTAL", "CKMB", "CKMBINDEX", "TROPONINI" in the last 168 hours. BNP (last 3 results) No results for input(s): "PROBNP" in the last 8760 hours. HbA1C: No results for input(s): "HGBA1C" in the last 72 hours. CBG: No results for input(s): "GLUCAP" in the last 168 hours. Lipid Profile: No results for input(s): "CHOL", "HDL", "LDLCALC", "  TRIG", "CHOLHDL", "LDLDIRECT" in the last 72 hours. Thyroid Function Tests: No results for input(s): "TSH", "T4TOTAL", "FREET4", "T3FREE", "THYROIDAB" in the last 72 hours.  Anemia Panel: No results for input(s): "VITAMINB12", "FOLATE", "FERRITIN", "TIBC", "IRON", "RETICCTPCT" in the last 72 hours. Sepsis Labs: Recent Labs  Lab 12/12/21 1530  PROCALCITON 0.12    Recent Results (from the past 240 hour(s))  Urine Culture     Status: Abnormal   Collection Time: 12/12/21  3:15 PM   Specimen: Urine, Clean Catch  Result Value Ref Range Status   Specimen Description   Final    URINE, CLEAN CATCH Performed at [facility], [address], [place], [place] [address]    Special Requests   Final    NONE Performed at [facility], [place], [place]  [address]    Culture >=100,000 COLONIES/mL PSEUDOMONAS AERUGINOSA (A)  Final   Report Status 12/14/2021 FINAL  Final   Organism ID, Bacteria PSEUDOMONAS AERUGINOSA (A)  Final      Susceptibility   Pseudomonas aeruginosa - MIC*    CEFTAZIDIME <=1 SENSITIVE Sensitive     CIPROFLOXACIN <=0.25 SENSITIVE Sensitive     GENTAMICIN <=1 SENSITIVE Sensitive     IMIPENEM <=0.25 SENSITIVE Sensitive     PIP/TAZO <=4 SENSITIVE Sensitive     CEFEPIME 2 SENSITIVE Sensitive     * >=100,000 COLONIES/mL PSEUDOMONAS AERUGINOSA  SARS Coronavirus 2 by RT PCR (hospital order, performed in [place] hospital lab) *cepheid single result test* Anterior Nasal Swab     Status: None   Collection Time: 12/12/21  4:43 PM   Specimen: Anterior Nasal Swab  Result Value Ref Range Status   SARS Coronavirus 2 by RT PCR NEGATIVE NEGATIVE Final    Comment: (NOTE) SARS-CoV-2 target nucleic acids are NOT DETECTED.  The SARS-CoV-2 RNA is generally detectable in upper and lower respiratory specimens during the acute phase of infection. The lowest concentration of SARS-CoV-2 viral copies this assay can detect is 250 copies / mL. A negative result does not preclude SARS-CoV-2 infection and should not be used as the sole basis for treatment or other patient management decisions.  A negative result may occur with improper specimen collection / handling, submission of specimen other than nasopharyngeal swab, presence of viral mutation(s) within the areas targeted by this assay, and inadequate number of viral copies (<250 copies / mL). A negative result must be combined with clinical observations, patient history, and epidemiological information.  Fact Sheet for Patients:   https://www.patel.info/  Fact Sheet for Healthcare Providers: https://hall.com/  This test is not yet approved or  cleared by the [place] FDA and has been authorized for detection and/or diagnosis of  SARS-CoV-2 by FDA under an Emergency Use Authorization (EUA).  This EUA will remain in effect (meaning this test can be used) for the duration of the COVID-19 declaration under Section 564(b)(1) of the Act, 21 U.S.C. section 360bbb-3(b)(1), unless the authorization is terminated or revoked sooner.  Performed at [facility], [address] [place], [place] [address]   Urine Culture     Status: Abnormal   Collection Time: 12/12/21  6:43 PM   Specimen: Urine, Random  Result Value Ref Range Status   Specimen Description URINE, RANDOM  Final   Special Requests   Final    RENAL PELVIS URINE Performed at [facility], [place] [address], [place], [place] [address]    Culture >=100,000 COLONIES/mL PSEUDOMONAS AERUGINOSA (A)  Final  Report Status 12/14/2021 FINAL  Final   Organism ID, Bacteria PSEUDOMONAS AERUGINOSA (A)  Final      Susceptibility   Pseudomonas aeruginosa - MIC*    CEFTAZIDIME 2 SENSITIVE Sensitive     CIPROFLOXACIN <=0.25 SENSITIVE Sensitive     GENTAMICIN <=1 SENSITIVE Sensitive     IMIPENEM <=0.25 SENSITIVE Sensitive     PIP/TAZO <=4 SENSITIVE Sensitive     CEFEPIME 2 SENSITIVE Sensitive     * >=100,000 COLONIES/mL PSEUDOMONAS AERUGINOSA         Radiology Studies: No results found.      Scheduled Meds:  apixaban  5 mg Oral BID   busPIRone  5 mg Oral q AM   feeding supplement  1 Container Oral TID BM   FLUoxetine  40 mg Oral Q breakfast   lidocaine  1 patch Transdermal Q24H   methimazole  5 mg Oral Daily   mirtazapine  15 mg Oral QHS   multivitamin with minerals  1 tablet Oral Daily   OLANZapine  7.5 mg Oral QHS   sodium chloride flush  3 mL Intravenous Q12H   Continuous Infusions:  ceFEPime (MAXIPIME) IV 2 g (12/16/21 0836)    Assessment & Plan:   Principal Problem:   UTI (urinary tract infection) Active Problems:   Urinary incontinence   Depression with anxiety   Hyperthyroidism   Chronic deep vein thrombosis (DVT) (HCC)    Stage 3a chronic kidney disease (HCC)   Normocytic anemia   Hydronephrosis with obstructing calculus   Hypokalemia   Protein-calorie malnutrition, severe   UTI Hydronephrosis with obstructing renal stone S/o rt ureteral stent Urology following Continue iv abx Lidocaine patch for Rt side pain 7/21 used to follow-up with urology for definite stone management in 2 to 3 weeks.  They will contact her 7/22 urine culture with Pseudomonas, change to cefepime based on sensitivity 7/23 continue with cefepime  Depression Anxiety > Has had history of severe depression, has required ECT in the past. - Continue home BuSpar, Fluxid teen, mirtazapine, olanzapine 7/23 continue Xanax as needed          Hypothyroidism Continue methimazole      History of DVT and PE - Holding home Eliquis periprocedurally 7/23 continue Eliquis      Hypokalemia CKD 3A > Creatinine remained stable at 1.28 in the ED.  Potassium low at 2.9, magnesium normal. > Received 40 mEq p.o. potassium and 10 mEq IV potassium in the ED. stable - Avoid nephrotoxic agents 7/21 h/h stable 7/23 with tea colored urine Ck cbc  Ivf   Anemia Monitor Transfuse if hg7 or less 7/21 h/h stable  Severe malnutrition Moderate fat depletion Severe muscle depletion, 15% wt loss in 6 month Supplements 7/23 encouraged po intake   DVT prophylaxis: eliquis Code Status:full Family Communication: none at bedside Disposition Plan: SNF pending Status [id number] Patient is inpatient as she requires IV treatment, needs sbf        LOS: 3 days   Time spent: 35 min    Nolberto Hanlon, MD [facility] Pager [phone]  If 7PM-7AM, please contact night-coverage 12/16/2021, 2:48 PM

## 2021-12-16 NOTE — Plan of Care (Signed)
Patient alert and oriented. Taking medications whole, refused supplement drink. No complaints of pain. Purewick in place during the night with amber colored urine. Patient repositioned herself in bed through the night without need for assistance.   Problem: Education: Goal: Knowledge of General Education information will improve Description: Including pain rating scale, medication(s)/side effects and non-pharmacologic comfort measures Outcome: Progressing   Problem: Health Behavior/Discharge Planning: Goal: Ability to manage health-related needs will improve Outcome: Progressing   Problem: Clinical Measurements: Goal: Ability to maintain clinical measurements within normal limits will improve Outcome: Progressing Goal: Will remain free from infection Outcome: Progressing Goal: Diagnostic test results will improve Outcome: Progressing Goal: Respiratory complications will improve Outcome: Progressing Goal: Cardiovascular complication will be avoided Outcome: Progressing   Problem: Activity: Goal: Risk for activity intolerance will decrease Outcome: Progressing   Problem: Nutrition: Goal: Adequate nutrition will be maintained Outcome: Progressing   Problem: Coping: Goal: Level of anxiety will decrease Outcome: Progressing   Problem: Elimination: Goal: Will not experience complications related to bowel motility Outcome: Progressing Goal: Will not experience complications related to urinary retention Outcome: Progressing   Problem: Pain Managment: Goal: General experience of comfort will improve Outcome: Progressing   Problem: Safety: Goal: Ability to remain free from injury will improve Outcome: Progressing   Problem: Skin Integrity: Goal: Risk for impaired skin integrity will decrease Outcome: Progressing

## 2021-12-17 DIAGNOSIS — N132 Hydronephrosis with renal and ureteral calculous obstruction: Secondary | ICD-10-CM | POA: Diagnosis not present

## 2021-12-17 DIAGNOSIS — F418 Other specified anxiety disorders: Secondary | ICD-10-CM | POA: Diagnosis not present

## 2021-12-17 DIAGNOSIS — I825Y9 Chronic embolism and thrombosis of unspecified deep veins of unspecified proximal lower extremity: Secondary | ICD-10-CM | POA: Diagnosis not present

## 2021-12-17 DIAGNOSIS — N390 Urinary tract infection, site not specified: Secondary | ICD-10-CM | POA: Diagnosis not present

## 2021-12-17 LAB — BASIC METABOLIC PANEL
Anion gap: 6 (ref 5–15)
BUN: 18 mg/dL (ref 8–23)
CO2: 25 mmol/L (ref 22–32)
Calcium: 9.3 mg/dL (ref 8.9–10.3)
Chloride: 112 mmol/L — ABNORMAL HIGH (ref 98–111)
Creatinine, Ser: 0.92 mg/dL (ref 0.44–1.00)
GFR, Estimated: >60 mL/min (ref >60)
Glucose, Bld: 83 mg/dL (ref 70–99)
Potassium: 3.3 mmol/L — ABNORMAL LOW (ref 3.5–5.1)
Sodium: 143 mmol/L (ref 135–145)

## 2021-12-17 LAB — CBC
HCT: 26.3 % — ABNORMAL LOW (ref 36.0–46.0)
Hemoglobin: 8.2 g/dL — ABNORMAL LOW (ref 12.0–15.0)
MCH: 26.5 pg (ref 26.0–34.0)
MCHC: 31.2 g/dL (ref 30.0–36.0)
MCV: 84.8 fL (ref 80.0–100.0)
Platelets: 241 10*3/uL (ref 150–400)
RBC: 3.1 MIL/uL — ABNORMAL LOW (ref 3.87–5.11)
RDW: 13.8 % (ref 11.5–15.5)
WBC: 4.9 10*3/uL (ref 4.0–10.5)
nRBC: 0 % (ref 0.0–0.2)

## 2021-12-17 MED ORDER — CIPROFLOXACIN HCL 500 MG PO TABS: 500.0000 mg | ORAL_TABLET | Freq: Two times a day (BID) | ORAL | 0 refills | Status: AC

## 2021-12-17 MED ORDER — POTASSIUM CHLORIDE CRYS ER 20 MEQ PO TBCR
40.0000 meq | EXTENDED_RELEASE_TABLET | Freq: Once | ORAL | Status: AC
  Administered 2021-12-17: 40 meq via ORAL
  Filled 2021-12-17: qty 2

## 2021-12-17 MED ORDER — LIDOCAINE 5 % EX PTCH: 1.0000 | MEDICATED_PATCH | CUTANEOUS | 0 refills | Status: AC

## 2021-12-17 NOTE — Care Management Important Message (Signed)
Important Message  Patient Details  Name: Amber [name] MRN: [id number] Date of Birth: 1945-08-28   Medicare Important Message Given:  Yes     Dannette Barbara 12/17/2021, 10:27 AM

## 2021-12-17 NOTE — Progress Notes (Signed)
Discharge instructions reviewed with patients daughter and husband. Questions and concerns addressed and PIV removed. Patient Amber [name] home

## 2021-12-17 NOTE — Discharge Summary (Signed)
Amber [name]:[id number] DOB: Nov 30, 1945 DOA: 12/12/2021  PCP: Lindell Spar, MD  Admit date: 12/12/2021 Discharge date: 12/17/2021  Admitted From: home Disposition:  home-family refused SNF  Recommendations for Outpatient Follow-up:  Follow up with PCP in 1 week Please obtain BMP/CBC in one week Please follow up with urology as scheduled  Home Health:yes    Discharge Condition:Stable CODE STATUS:full  Diet recommendation: Dysphagia 3   Brief/Interim Summary: Per HPI: Amber [name] is a 76 y.o. female with medical history significant of incontinence, depression, anxiety, hypothyroidism, abnormal gait, GERD, hyperparathyroidism, chronic DVT history of PE, CKD 3A, anemia, thoracic aortic aneurysm, stroke presenting with lethargy, decreased p.o. intake and depression as well as some abdominal pain and nausea.ED Course: Vital signs in the ED significant for blood pressure in the 563O systolic.  Lab work-up included CMP with potassium 2.9, BUN 24, creatinine stable at 1.28, glucose 142, albumin 2.8.  CBC with hemoglobin stable at 9.3, leukocytosis of 13.1.  TSH remains low at 0.06, troponin mildly elevated at 41 .Ammonia level normal.  Chest x-ray showed no acute normality.  Right shoulder x-ray showed no acute normality.  CT head showed no acute abnormality.  CT C-spine showed no acute abnormality.  CT of the abdomen pelvis showed obstructive renal calculi with severe right hydronephrosis and right perinephric fat stranding as well as a stable left renal mass. Urology was consulted, patient started on IV antibiotics.She is s/p Right ureteral stent placement by Dr. Erlene Quan on 12/12/21. Urine culture grow Pseudomonas aeruginosa. Patient was evaluated by PT and recommended SNF> however family declined. Patient will be discharged home with [facility]. Spoke to urology PA, recommended ciprofloxacin 7 days as outpatient.    UTI Hydronephrosis with obstructing renal stone S/o rt ureteral stent on  7/19 Urology following Was treated with iv antibiotic and will be discharged with p.o. antibiotics ciprofloxacin x7 days per urology's recommendation Lidocaine patch for Rt side pain follow-up with urology for definite stone management in 2 to 3 weeks.  They will set her up.  urine culture with Pseudomonas   Depression Anxiety > Has had history of severe depression, has required ECT in the past. - Continue home BuSpar, Fluxid teen, mirtazapine, olanzapine continue Xanax as needed                Hypothyroidism Continue methimazole        History of DVT and PE continue Eliquis        Hypokalemia CKD 3A Potassium replaced. Renal function stable   Chronic Anemia H/h stable   Severe malnutrition Moderate fat depletion Severe muscle depletion, 15% wt loss in 6 month Supplements encouraged po intake  Discharge Diagnoses:  Principal Problem:   UTI (urinary tract infection) Active Problems:   Urinary incontinence   Depression with anxiety   Hyperthyroidism   Chronic deep vein thrombosis (DVT) (HCC)   Stage 3a chronic kidney disease (HCC)   Normocytic anemia   Hydronephrosis with obstructing calculus   Hypokalemia   Protein-calorie malnutrition, severe    Discharge Instructions  Discharge Instructions     Call MD for:  temperature >100.4   Complete by: As directed    Diet - low sodium heart healthy   Complete by: As directed    Dysphagia 3   Discharge instructions   Complete by: As directed    Follow up with urology for surgery dte   Increase activity slowly   Complete by: As directed       Allergies as  of 12/17/2021       Reactions   Fish Allergy    Does not eat fish   Shellfish Allergy    "I pass out"   Valium [diazepam] Other (See Comments)   Makes her want to " fly"        Medication List     STOP taking these medications    ondansetron 4 MG tablet Commonly known as: ZOFRAN       TAKE these medications    acetaminophen  500 MG tablet Commonly known as: TYLENOL Take 500 mg by mouth every 6 (six) hours as needed for moderate pain or mild pain.   ALPRAZolam 0.5 MG tablet Commonly known as: XANAX Take 1 tablet (0.5 mg total) by mouth 2 (two) times daily as needed for anxiety. What changed:  how much to take additional instructions   apixaban 5 MG Tabs tablet Commonly known as: ELIQUIS Take 1 tablet (5 mg total) by mouth 2 (two) times daily.   busPIRone 5 MG tablet Commonly known as: BUSPAR Take 1 tablet (5 mg total) by mouth in the morning.   ciprofloxacin 500 MG tablet Commonly known as: Cipro Take 1 tablet (500 mg total) by mouth 2 (two) times daily for 7 days. Start taking on: December 18, 2021   docusate sodium 100 MG capsule Commonly known as: COLACE Take 1 capsule by mouth 2 (two) times daily.   ferrous sulfate 325 (65 FE) MG tablet Take 1 tablet (325 mg total) by mouth daily with breakfast.   FLUoxetine 40 MG capsule Commonly known as: PROZAC Take 1 capsule (40 mg total) by mouth daily with breakfast.   lidocaine 5 % Commonly known as: LIDODERM Place 1 patch onto the skin daily for 14 days. Remove & Discard patch within 12 hours or as directed by MD Start taking on: December 18, 2021   methimazole 5 MG tablet Commonly known as: TAPAZOLE Take 1 tablet (5 mg total) by mouth daily.   metoprolol succinate 25 MG 24 hr tablet Commonly known as: TOPROL-XL TAKE 1/2 TABLET BY MOUTH ONCE DAILY   mirtazapine 15 MG tablet Commonly known as: REMERON Take 1 tablet (15 mg total) by mouth at bedtime.   multivitamin with minerals Tabs tablet Take 1 tablet by mouth daily.   OLANZapine 7.5 MG tablet Commonly known as: ZYPREXA Take 1 tablet (7.5 mg total) by mouth at bedtime.   polyethylene glycol powder 17 GM/SCOOP powder Commonly known as: GLYCOLAX/MIRALAX Take 17 g by mouth as needed for mild constipation.   sodium bicarbonate 650 MG tablet Take 1 tablet (650 mg total) by mouth daily.    Vitamin D (Ergocalciferol) 1.25 MG (50000 UNIT) Caps capsule Commonly known as: DRISDOL Take 1 capsule (50,000 Units total) by mouth every 7 (seven) days.        Follow-up Information     Lindell Spar, MD Follow up in 1 week(s).   Specialty: Internal Medicine Contact information: [place] [address] [phone]                Allergies  Allergen Reactions   Fish Allergy     Does not eat fish   Shellfish Allergy     "I pass out"   Valium [Diazepam] Other (See Comments)    Makes her want to " fly"    Consultations: urology   Procedures/Studies: DG OR UROLOGY CYSTO IMAGE ([employer])  Result Date: 12/12/2021 There is no interpretation for this exam.  This  order is for images obtained during a surgical procedure.  Please See "Surgeries" Tab for more information regarding the procedure.   DG Shoulder Right  Result Date: 12/12/2021 CLINICAL DATA:  Fall.  Weakness.  Failure to thrive. EXAM: RIGHT SHOULDER - 2+ VIEW COMPARISON:  PET-CT 11/08/2021, AP chest 06/27/2021 FINDINGS: There is diffuse decreased bone mineralization. Mild inferior glenoid degenerative osteophytosis. No acute fracture or dislocation. Small sclerotic focus within the slightly medial aspect of the humeral head is unchanged from 06/27/2021 chest radiograph. IMPRESSION: No acute fracture. Electronically Signed   By: Yvonne Kendall M.D.   On: 12/12/2021 16:59   DG Chest 2 View  Result Date: 12/12/2021 CLINICAL DATA:  Weakness. EXAM: CHEST - 2 VIEW COMPARISON:  June 27, 2021 FINDINGS: Tortuosity and calcific atherosclerotic disease of the aorta. Cardiomediastinal silhouette is normal. Mediastinal contours appear intact. There is no evidence of focal airspace consolidation, pleural effusion or pneumothorax. Osseous structures are without acute abnormality. Soft tissues are grossly normal. IMPRESSION: No active cardiopulmonary disease. Electronically Signed   By: Fidela Salisbury  M.D.   On: 12/12/2021 16:55   CT ABDOMEN PELVIS W CONTRAST  Result Date: 12/12/2021 CLINICAL DATA:  Acute nonlocalized abdominal pain. EXAM: CT ABDOMEN AND PELVIS WITH CONTRAST TECHNIQUE: Multidetector CT imaging of the abdomen and pelvis was performed using the standard protocol following bolus administration of intravenous contrast. RADIATION DOSE REDUCTION: This exam was performed according to the departmental dose-optimization program which includes automated exposure control, adjustment of the mA and/or kV according to patient size and/or use of iterative reconstruction technique. CONTRAST:  38m OMNIPAQUE IOHEXOL 300 MG/ML  SOLN COMPARISON:  June 14, 2021 FINDINGS: Lower chest: Enlarged heart. Calcific atherosclerotic disease of the coronary arteries. Hepatobiliary: Left liver cyst. Normal appearance of the gallbladder. Pancreas: Unremarkable. No pancreatic ductal dilatation or surrounding inflammatory changes. Spleen: Normal in size without focal abnormality. Adrenals/Urinary Tract: Normal adrenal glands. Partially exophytic left renal cyst, stable. Again seen is obstructing ureteral stone in the right UPJ measuring 1.1 cm. A second 8 mm calculus is located within the proximal right ureter slightly distal to the UPJ where the ureter has a reverse S shaped configuration. There is right perinephric fat stranding. Stomach/Bowel: Stomach is within normal limits. Appendix appears normal. No evidence of bowel wall thickening, distention, or inflammatory changes. Vascular/Lymphatic: Aortic atherosclerosis. No enlarged abdominal or pelvic lymph nodes. 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 Coarse calcifications within the right adnexa. Other: No abdominal wall hernia or abnormality. No abdominopelvic ascites. Musculoskeletal: No acute or  significant osseous findings. IMPRESSION: 1. Two obstructing ureteral calculi in the right UPJ and proximal right ureter, with reverse S shaped configuration of the proximal right ureter. Resultant severe right hydronephrosis and right perinephric stranding. 2. 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 3. Enlarged heart. 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 Aortic Atherosclerosis (ICD10-I70.0). Electronically Signed   By: DFidela SalisburyM.D.   On: 12/12/2021 16:40   CT HEAD WO CONTRAST (5MM)  Result Date: 12/12/2021 CLINICAL DATA:  Altered mental status. EXAM: CT HEAD WITHOUT CONTRAST CT CERVICAL SPINE WITHOUT CONTRAST TECHNIQUE: Multidetector CT imaging of the head and cervical spine was performed following the standard protocol without intravenous contrast. Multiplanar CT image reconstructions of the cervical spine were also generated. RADIATION DOSE REDUCTION: This exam was  performed according to the departmental dose-optimization program which includes automated exposure control, adjustment of the mA and/or kV according to patient size and/or use of iterative reconstruction technique. COMPARISON:  June 08, 2021. FINDINGS: CT HEAD FINDINGS Brain: No evidence of acute infarction, hemorrhage, hydrocephalus, extra-axial collection or mass lesion/mass effect. Vascular: No hyperdense vessel or unexpected calcification. Skull: Normal. Negative for fracture or focal lesion. Sinuses/Orbits: No acute finding. Other: None. CT CERVICAL SPINE FINDINGS Alignment: Mild grade 1 anterolisthesis of C4-5 is noted secondary to posterior facet joint hypertrophy. Skull base and vertebrae: No acute fracture. No primary bone lesion or focal pathologic process. Soft tissues and spinal canal: No prevertebral fluid or swelling. No visible canal hematoma. Disc levels: Moderate degenerative disc disease is  noted at C5-6 and C6-7. Upper chest: Negative. Other: None. IMPRESSION: No acute intracranial abnormality seen. Moderate multilevel degenerative disc disease. No acute abnormality seen in the cervical spine. Electronically Signed   By: Marijo Conception M.D.   On: 12/12/2021 16:34   CT Cervical Spine Wo Contrast  Result Date: 12/12/2021 CLINICAL DATA:  Altered mental status. EXAM: CT HEAD WITHOUT CONTRAST CT CERVICAL SPINE WITHOUT CONTRAST TECHNIQUE: Multidetector CT imaging of the head and cervical spine was performed following the standard protocol without intravenous contrast. Multiplanar CT image reconstructions of the cervical spine were also generated. RADIATION DOSE REDUCTION: This exam was performed according to the departmental dose-optimization program which includes automated exposure control, adjustment of the mA and/or kV according to patient size and/or use of iterative reconstruction technique. COMPARISON:  June 08, 2021. FINDINGS: CT HEAD FINDINGS Brain: No evidence of acute infarction, hemorrhage, hydrocephalus, extra-axial collection or mass lesion/mass effect. Vascular: No hyperdense vessel or unexpected calcification. Skull: Normal. Negative for fracture or focal lesion. Sinuses/Orbits: No acute finding. Other: None. CT CERVICAL SPINE FINDINGS Alignment: Mild grade 1 anterolisthesis of C4-5 is noted secondary to posterior facet joint hypertrophy. Skull base and vertebrae: No acute fracture. No primary bone lesion or focal pathologic process. Soft tissues and spinal canal: No prevertebral fluid or swelling. No visible canal hematoma. Disc levels: Moderate degenerative disc disease is noted at C5-6 and C6-7. Upper chest: Negative. Other: None. IMPRESSION: No acute intracranial abnormality seen. Moderate multilevel degenerative disc disease. No acute abnormality seen in the cervical spine. Electronically Signed   By: Marijo Conception M.D.   On: 12/12/2021 16:34      Subjective: No  complaints  Discharge Exam: Vitals:   12/17/21 0802 12/17/21 0803  BP: (!) 169/96 (!) 165/92  Pulse: 78 77  Resp: 16   Temp: 98 F (36.7 C)   SpO2: 98% 95%   Vitals:   12/16/21 1915 12/17/21 0450 12/17/21 0802 12/17/21 0803  BP: (!) 146/91 (!) 144/90 (!) 169/96 (!) 165/92  Pulse: 85 77 78 77  Resp: '18 16 16   '$ Temp: 97.6 F (36.4 C) 98.5 F (36.9 C) 98 F (36.7 C)   TempSrc: Oral     SpO2: 97% 97% 98% 95%  Weight:      Height:        General: Pt is alert, awake, not in acute distress Cardiovascular: RRR, S1/S2 +, no rubs, no gallops Respiratory: CTA bilaterally, no wheezing, no rhonchi Abdominal: Soft, NT, ND, bowel sounds + Extremities: no edema, no cyanosis    The results of significant diagnostics from this hospitalization (including imaging, microbiology, ancillary and laboratory) are listed below for reference.     Microbiology: Recent Results (from the past 240 hour(s))  Urine Culture  Status: Abnormal   Collection Time: 12/12/21  3:15 PM   Specimen: Urine, Clean Catch  Result Value Ref Range Status   Specimen Description   Final    URINE, CLEAN CATCH Performed at [facility], [address] [place], [place] [address]    Special Requests   Final    NONE Performed at [facility], [place], [place] [address]    Culture >=100,000 COLONIES/mL PSEUDOMONAS AERUGINOSA (A)  Final   Report Status 12/14/2021 FINAL  Final   Organism ID, Bacteria PSEUDOMONAS AERUGINOSA (A)  Final      Susceptibility   Pseudomonas aeruginosa - MIC*    CEFTAZIDIME <=1 SENSITIVE Sensitive     CIPROFLOXACIN <=0.25 SENSITIVE Sensitive     GENTAMICIN <=1 SENSITIVE Sensitive     IMIPENEM <=0.25 SENSITIVE Sensitive     PIP/TAZO <=4 SENSITIVE Sensitive     CEFEPIME 2 SENSITIVE Sensitive     * >=100,000 COLONIES/mL PSEUDOMONAS AERUGINOSA  SARS Coronavirus 2 by RT PCR (hospital order, performed in [place] hospital lab) *cepheid single result  test* Anterior Nasal Swab     Status: None   Collection Time: 12/12/21  4:43 PM   Specimen: Anterior Nasal Swab  Result Value Ref Range Status   SARS Coronavirus 2 by RT PCR NEGATIVE NEGATIVE Final    Comment: (NOTE) SARS-CoV-2 target nucleic acids are NOT DETECTED.  The SARS-CoV-2 RNA is generally detectable in upper and lower respiratory specimens during the acute phase of infection. The lowest concentration of SARS-CoV-2 viral copies this assay can detect is 250 copies / mL. A negative result does not preclude SARS-CoV-2 infection and should not be used as the sole basis for treatment or other patient management decisions.  A negative result may occur with improper specimen collection / handling, submission of specimen other than nasopharyngeal swab, presence of viral mutation(s) within the areas targeted by this assay, and inadequate number of viral copies (<250 copies / mL). A negative result must be combined with clinical observations, patient history, and epidemiological information.  Fact Sheet for Patients:   https://www.patel.info/  Fact Sheet for Healthcare Providers: https://hall.com/  This test is not yet approved or  cleared by the [place] FDA and has been authorized for detection and/or diagnosis of SARS-CoV-2 by FDA under an Emergency Use Authorization (EUA).  This EUA will remain in effect (meaning this test can be used) for the duration of the COVID-19 declaration under Section 564(b)(1) of the Act, 21 U.S.C. section 360bbb-3(b)(1), unless the authorization is terminated or revoked sooner.  Performed at [facility], [address] [place], [place] [address]   Urine Culture     Status: Abnormal   Collection Time: 12/12/21  6:43 PM   Specimen: Urine, Random  Result Value Ref Range Status   Specimen Description URINE, RANDOM  Final   Special Requests   Final    RENAL PELVIS URINE Performed at [facility], [place] [address] [place], [place] [address]    Culture >=100,000 COLONIES/mL PSEUDOMONAS AERUGINOSA (A)  Final   Report Status 12/14/2021 FINAL  Final   Organism ID, Bacteria PSEUDOMONAS AERUGINOSA (A)  Final      Susceptibility   Pseudomonas aeruginosa - MIC*    CEFTAZIDIME 2 SENSITIVE Sensitive     CIPROFLOXACIN <=0.25 SENSITIVE Sensitive     GENTAMICIN <=1 SENSITIVE Sensitive     IMIPENEM <=0.25 SENSITIVE Sensitive     PIP/TAZO <=4 SENSITIVE Sensitive  CEFEPIME 2 SENSITIVE Sensitive     * >=100,000 COLONIES/mL PSEUDOMONAS AERUGINOSA     Labs: BNP (last 3 results) No results for input(s): "BNP" in the last 8760 hours. Basic Metabolic Panel: Recent Labs  Lab 12/12/21 1515 12/12/21 1530 12/13/21 0512 12/17/21 0555  NA 140  --  142 143  K 2.9*  --  3.5 3.3*  CL 109  --  111 112*  CO2 24  --  24 25  GLUCOSE 142*  --  111* 83  BUN 24*  --  27* 18  CREATININE 1.28*  --  1.16* 0.92  CALCIUM 9.8  --  9.6 9.3  MG  --  1.9  --   --    Liver Function Tests: Recent Labs  Lab 12/12/21 1515 12/13/21 0512  AST 12* 6*  ALT 9 8  ALKPHOS 58 46  BILITOT 0.9 0.4  PROT 6.6 5.6*  ALBUMIN 2.8* 2.3*   No results for input(s): "LIPASE", "AMYLASE" in the last 168 hours. Recent Labs  Lab 12/12/21 1515  AMMONIA 17   CBC: Recent Labs  Lab 12/12/21 1515 12/13/21 0512 12/14/21 0504 12/17/21 0555  WBC 13.4* 7.4  --  4.9  NEUTROABS 12.3*  --   --   --   HGB 9.3* 8.4* 8.4* 8.2*  HCT 29.9* 26.3* 25.7* 26.3*  MCV 85.4 83.8  --  84.8  PLT 295 234  --  241   Cardiac Enzymes: No results for input(s): "CKTOTAL", "CKMB", "CKMBINDEX", "TROPONINI" in the last 168 hours. BNP: Invalid input(s): "POCBNP" CBG: No results for input(s): "GLUCAP" in the last 168 hours. D-Dimer No results for input(s): "DDIMER" in the last 72 hours. Hgb A1c No results for input(s): "HGBA1C" in the last 72 hours. Lipid Profile No results for input(s): "CHOL", "HDL", "LDLCALC", "TRIG",  "CHOLHDL", "LDLDIRECT" in the last 72 hours. Thyroid function studies No results for input(s): "TSH", "T4TOTAL", "T3FREE", "THYROIDAB" in the last 72 hours.  Invalid input(s): "FREET3" Anemia work up No results for input(s): "VITAMINB12", "FOLATE", "FERRITIN", "TIBC", "IRON", "RETICCTPCT" in the last 72 hours. Urinalysis    Component Value Date/Time   COLORURINE YELLOW (A) 12/12/2021 1515   APPEARANCEUR TURBID (A) 12/12/2021 1515   APPEARANCEUR Cloudy (A) 03/30/2021 1431   LABSPEC 1.014 12/12/2021 1515   PHURINE 6.0 12/12/2021 1515   GLUCOSEU NEGATIVE 12/12/2021 1515   HGBUR MODERATE (A) 12/12/2021 1515   BILIRUBINUR NEGATIVE 12/12/2021 1515   BILIRUBINUR Negative 03/30/2021 1431   KETONESUR NEGATIVE 12/12/2021 1515   PROTEINUR 100 (A) 12/12/2021 1515   NITRITE POSITIVE (A) 12/12/2021 1515   LEUKOCYTESUR MODERATE (A) 12/12/2021 1515   Sepsis Labs Recent Labs  Lab 12/12/21 1515 12/13/21 0512 12/17/21 0555  WBC 13.4* 7.4 4.9   Microbiology Recent Results (from the past 240 hour(s))  Urine Culture     Status: Abnormal   Collection Time: 12/12/21  3:15 PM   Specimen: Urine, Clean Catch  Result Value Ref Range Status   Specimen Description   Final    URINE, CLEAN CATCH Performed at [facility], [address], [place], [place] [address]    Special Requests   Final    NONE Performed at [facility], [address] [place], [place] [address]    Culture >=100,000 COLONIES/mL PSEUDOMONAS AERUGINOSA (A)  Final   Report Status 12/14/2021 FINAL  Final   Organism ID, Bacteria PSEUDOMONAS AERUGINOSA (A)  Final      Susceptibility   Pseudomonas aeruginosa - MIC*    CEFTAZIDIME <=  1 SENSITIVE Sensitive     CIPROFLOXACIN <=0.25 SENSITIVE Sensitive     GENTAMICIN <=1 SENSITIVE Sensitive     IMIPENEM <=0.25 SENSITIVE Sensitive     PIP/TAZO <=4 SENSITIVE Sensitive     CEFEPIME 2 SENSITIVE Sensitive     * >=100,000 COLONIES/mL PSEUDOMONAS AERUGINOSA  SARS  Coronavirus 2 by RT PCR (hospital order, performed in [place] hospital lab) *cepheid single result test* Anterior Nasal Swab     Status: None   Collection Time: 12/12/21  4:43 PM   Specimen: Anterior Nasal Swab  Result Value Ref Range Status   SARS Coronavirus 2 by RT PCR NEGATIVE NEGATIVE Final    Comment: (NOTE) SARS-CoV-2 target nucleic acids are NOT DETECTED.  The SARS-CoV-2 RNA is generally detectable in upper and lower respiratory specimens during the acute phase of infection. The lowest concentration of SARS-CoV-2 viral copies this assay can detect is 250 copies / mL. A negative result does not preclude SARS-CoV-2 infection and should not be used as the sole basis for treatment or other patient management decisions.  A negative result may occur with improper specimen collection / handling, submission of specimen other than nasopharyngeal swab, presence of viral mutation(s) within the areas targeted by this assay, and inadequate number of viral copies (<250 copies / mL). A negative result must be combined with clinical observations, patient history, and epidemiological information.  Fact Sheet for Patients:   https://www.patel.info/  Fact Sheet for Healthcare Providers: https://hall.com/  This test is not yet approved or  cleared by the [place] FDA and has been authorized for detection and/or diagnosis of SARS-CoV-2 by FDA under an Emergency Use Authorization (EUA).  This EUA will remain in effect (meaning this test can be used) for the duration of the COVID-19 declaration under Section 564(b)(1) of the Act, 21 U.S.C. section 360bbb-3(b)(1), unless the authorization is terminated or revoked sooner.  Performed at [facility], [address] [place], [place] [address]   Urine Culture     Status: Abnormal   Collection Time: 12/12/21  6:43 PM   Specimen: Urine, Random  Result Value Ref Range Status   Specimen  Description URINE, RANDOM  Final   Special Requests   Final    RENAL PELVIS URINE Performed at [facility], [place] [address] [place], [place] [address]    Culture >=100,000 COLONIES/mL PSEUDOMONAS AERUGINOSA (A)  Final   Report Status 12/14/2021 FINAL  Final   Organism ID, Bacteria PSEUDOMONAS AERUGINOSA (A)  Final      Susceptibility   Pseudomonas aeruginosa - MIC*    CEFTAZIDIME 2 SENSITIVE Sensitive     CIPROFLOXACIN <=0.25 SENSITIVE Sensitive     GENTAMICIN <=1 SENSITIVE Sensitive     IMIPENEM <=0.25 SENSITIVE Sensitive     PIP/TAZO <=4 SENSITIVE Sensitive     CEFEPIME 2 SENSITIVE Sensitive     * >=100,000 COLONIES/mL PSEUDOMONAS AERUGINOSA     Time coordinating discharge: Over 30 minutes  SIGNED:   Nolberto Hanlon, MD  [facility] 12/17/2021, 11:41 AM Pager   If 7PM-7AM, please contact night-coverage [email or web address] Password [email or web address]

## 2021-12-17 NOTE — Progress Notes (Addendum)
Mobility Specialist - Progress Note       12/17/21 1127  Mobility  Activity Ambulated independently in room  Level of Assistance Standby assist, set-up cues, supervision of patient - no hands on  Assistive Device Front wheel walker  Distance Ambulated (ft) 25 ft  Activity Response Tolerated well  $Mobility charge 1 Mobility    Pt supine upon entry. Pt was utilizing RA.  Pt independently moved to EOB. Pt stood up unassisted with standby Pt ambulated 25 ft around room.  Pt declined transfer to chair, states is makes her nervous, fear of falling out of chair. Anxious during session. Pt returned to bed with needs in reach. Note written by MS Nastasia under the supervision of MS Wanna Gully.   Candie Mile  Mobility Specialist 12/17/21, 11:36 AM

## 2021-12-17 NOTE — TOC Transition Note (Signed)
Transition of Care [facility]) - CM/SW Discharge Note   Patient Details  Name: Amber [name] MRN: [id number] Date of Birth: 01-04-1946  Transition of Care [facility]) CM/SW Contact:  Beverly Sessions, RN Phone Number: 12/17/2021, 1:35 PM   Clinical Narrative:     Patient to discharge today.  Meg with [facility] aware of discharge.  Per Meg they do not have aide available for home health.  Daughter is aware.     Barriers to Discharge: Continued Medical Work up   Patient Goals and CMS Choice        Discharge Placement                       Discharge Plan and Services   Discharge Planning Services: CM Consult                      HH Arranged: PT, OT, Nurse's Aide HH Agency: [facility] Agency Contacted: 12/16/21   Representative spoke with at [place]: Meg  Social Determinants of Health ([place]) Interventions     Readmission Risk Interventions     No data to display

## 2021-12-18 ENCOUNTER — Telehealth: Payer: Self-pay | Admitting: Internal Medicine

## 2021-12-18 NOTE — Telephone Encounter (Signed)
Transition Care Management Follow-up Telephone Call Date of discharge and from where: 12/17/21 [facility] How have you been since you were released from the hospital? [place]  Any questions or concerns? No  Items Reviewed: Did the pt receive and understand the discharge instructions provided? Yes  Medications obtained and verified? Yes  Other? Yes  Any new allergies since your discharge? No  Dietary orders reviewed? Yes Do you have support at home? Yes   Home Care and Equipment/Supplies: Were home health services ordered? yes If so, what is the name of the agency? [facility]  Has the agency set up a time to come to the patient's home? no Were any new equipment or medical supplies ordered?  No What is the name of the medical supply agency? na Were you able to get the supplies/equipment? na Do you have any questions related to the use of the equipment or supplies? No  Functional Questionnaire: (I = Independent and D = Dependent) ADLs: i  Bathing/Dressing- i  Meal Prep- i  Eating- i  Maintaining continence- i  Transferring/Ambulation- d with walker  Managing Meds- i  Follow up appointments reviewed:  PCP Hospital f/u appt confirmed? Yes  Scheduled to see patel on 12/27/21 @ 2:20. [facility] f/u appt confirmed? No  Scheduled to see . Are transportation arrangements needed? No  If their condition worsens, is the pt aware to call PCP or go to the Emergency Dept.? Yes Was the patient provided with contact information for the PCP's office or ED? Yes Was to pt encouraged to call back with questions or concerns? Yes

## 2021-12-19 ENCOUNTER — Telehealth: Payer: Self-pay

## 2021-12-19 NOTE — Telephone Encounter (Signed)
Verbal orders given  

## 2021-12-19 NOTE — Telephone Encounter (Signed)
Marzetta Board called from [facility] home health. Needs approval for Physical Therapy. Frequency 1 week 1, 2 week 1 , 1 week 2  Call back # [phone] this is a secure line so you can leave a voicemail.

## 2021-12-20 ENCOUNTER — Telehealth: Payer: Self-pay

## 2021-12-20 NOTE — Progress Notes (Signed)
[facility] Posting Form   Surgery Date/Time: Date: 01/14/2022  Surgeon: Dr. Hollice Espy, MD  Surgery Location: Day Surgery  Inpt ( No  )   Outpt (Yes)   Obs ( No  )   Diagnosis: N20.1 Right Ureteral Stone  -CPT: [phone]  Surgery: Right Ureteroscopy with laser lithotripsy and stent exchange  Stop Anticoagulations: Yes, stops Eliquis on 12/24/2021  Cardiac/Medical/Pulmonary Clearance needed: no  *Orders entered into EPIC  Date: 12/20/21   *Case booked in EPIC  Date: 12/19/2021  *Notified pt of Surgery: Date: 12/20/21  PRE-OP UA & CX: yes, is planning to obtain at PCP the week of 08/10.  *Placed into Prior Authorization Work Que Date: 12/20/21   Assistant/laser/rep:No

## 2021-12-20 NOTE — Telephone Encounter (Signed)
I spoke with Amber [name] and her daughter Amber [name]. We have discussed possible surgery dates and Monday August 21st, 2023 was agreed upon by all parties. Patient given information about surgery date, what to expect pre-operatively and post operatively.  We discussed that a Pre-Admission Testing office will be calling to set up the pre-op visit that will take place prior to surgery, and that these appointments are typically done over the phone with a Pre-Admissions RN.  Informed patient that our office will communicate any additional care to be provided after surgery. Patients questions or concerns were discussed during our call. Advised to call our office should there be any additional information, questions or concerns that arise. Patient verbalized understanding.

## 2021-12-25 DIAGNOSIS — K219 Gastro-esophageal reflux disease without esophagitis: Secondary | ICD-10-CM

## 2021-12-25 DIAGNOSIS — Z7901 Long term (current) use of anticoagulants: Secondary | ICD-10-CM

## 2021-12-25 DIAGNOSIS — Z792 Long term (current) use of antibiotics: Secondary | ICD-10-CM | POA: Diagnosis not present

## 2021-12-25 DIAGNOSIS — F32A Depression, unspecified: Secondary | ICD-10-CM

## 2021-12-25 DIAGNOSIS — Z8673 Personal history of transient ischemic attack (TIA), and cerebral infarction without residual deficits: Secondary | ICD-10-CM

## 2021-12-25 DIAGNOSIS — E43 Unspecified severe protein-calorie malnutrition: Secondary | ICD-10-CM | POA: Diagnosis not present

## 2021-12-25 DIAGNOSIS — D649 Anemia, unspecified: Secondary | ICD-10-CM

## 2021-12-25 DIAGNOSIS — N390 Urinary tract infection, site not specified: Secondary | ICD-10-CM | POA: Diagnosis not present

## 2021-12-25 DIAGNOSIS — F419 Anxiety disorder, unspecified: Secondary | ICD-10-CM

## 2021-12-25 DIAGNOSIS — B965 Pseudomonas (aeruginosa) (mallei) (pseudomallei) as the cause of diseases classified elsewhere: Secondary | ICD-10-CM | POA: Diagnosis not present

## 2021-12-25 DIAGNOSIS — N1831 Chronic kidney disease, stage 3a: Secondary | ICD-10-CM

## 2021-12-25 DIAGNOSIS — I82729 Chronic embolism and thrombosis of deep veins of unspecified upper extremity: Secondary | ICD-10-CM

## 2021-12-25 DIAGNOSIS — E0590 Thyrotoxicosis, unspecified without thyrotoxic crisis or storm: Secondary | ICD-10-CM

## 2021-12-27 ENCOUNTER — Inpatient Hospital Stay: Payer: Medicare Other | Admitting: Internal Medicine

## 2021-12-28 ENCOUNTER — Telehealth: Payer: Self-pay

## 2021-12-28 NOTE — Telephone Encounter (Signed)
Amber [name] from [facility] home [facility] called needs verbal order  Occupational therapy 1 week 3  Call back # [phone]

## 2021-12-31 ENCOUNTER — Ambulatory Visit (INDEPENDENT_AMBULATORY_CARE_PROVIDER_SITE_OTHER): Payer: Medicare Other | Admitting: Internal Medicine

## 2021-12-31 ENCOUNTER — Encounter: Payer: Self-pay | Admitting: Internal Medicine

## 2021-12-31 VITALS — BP 132/78 | HR 58 | Resp 16 | Ht 66.5 in | Wt 124.2 lb

## 2021-12-31 DIAGNOSIS — N132 Hydronephrosis with renal and ureteral calculous obstruction: Secondary | ICD-10-CM

## 2021-12-31 DIAGNOSIS — Z09 Encounter for follow-up examination after completed treatment for conditions other than malignant neoplasm: Secondary | ICD-10-CM

## 2021-12-31 DIAGNOSIS — E210 Primary hyperparathyroidism: Secondary | ICD-10-CM

## 2021-12-31 DIAGNOSIS — N3001 Acute cystitis with hematuria: Secondary | ICD-10-CM

## 2021-12-31 DIAGNOSIS — I2699 Other pulmonary embolism without acute cor pulmonale: Secondary | ICD-10-CM

## 2021-12-31 DIAGNOSIS — D508 Other iron deficiency anemias: Secondary | ICD-10-CM

## 2021-12-31 NOTE — Assessment & Plan Note (Signed)
Treated with IV abx followed by oral ciprofloxacin Check urine culture

## 2021-12-31 NOTE — Patient Instructions (Signed)
Please continue taking medications as prescribed.  Please continue to maintain at least 64 ounces of fluid intake in a day.

## 2021-12-31 NOTE — Assessment & Plan Note (Signed)
Hospital chart reviewed, including discharge summary S/p ureteral stent placement for renal calculi Treated with abx for UTI

## 2021-12-31 NOTE — Telephone Encounter (Signed)
Amber [name] with [facility] returned call for verbal orders.

## 2021-12-31 NOTE — Assessment & Plan Note (Signed)
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

## 2021-12-31 NOTE — Progress Notes (Signed)
Established Patient Office Visit  Subjective:  Patient ID: Amber [name], female    DOB: [birth year]  Age: 76 y.o. MRN: [id number]  CC:  Chief Complaint  Patient presents with   Transitions Of Care    [facility] patient was discharged 12-17-21 they put stent in kidney she had UTI patient is supposed to have kidney stone removed next week     HPI Amber [name] is a 76 y.o. female with past medical history of aortic atherosclerosis, depression with anxiety, PE, hyperthyroidism and protein-calorie malnutrition who presents for f/u after recent hospitalization.  She went to ER on 07/19 for abdominal pain and nausea. CT of the abdomen pelvis showed obstructive renal calculi with severe right hydronephrosis and right perinephric fat stranding as well as a stable left renal mass. Urology was consulted, patient started on IV antibiotics.She is s/p Right ureteral stent placement by Dr. Erlene Quan on 12/12/21. Urine culture grew Pseudomonas aeruginosa, for which she was placed on IV abx followed by oral ciprofloxacin.  She currently denies any dysuria, hematuria, nausea or vomiting.  She does report mild suprapubic pain.  Denies any fever or chills.  She is planned to have cystoscopy/ureteroscopy/holmium laser on 08/21.     Past Medical History:  Diagnosis Date   ABLA (acute blood loss anemia) 06/19/2021   Anxiety    Aspiration pneumonia of right lung due to gastric secretions (HCC)    C. difficile colitis 07/13/2021   COVID-19 virus infection 06/08/2021    Depression    Hypertension    Hyperthyroidism    Kidney disease    Stroke [facility])     Past Surgical History:  Procedure Laterality Date   CYSTOSCOPY W/ URETERAL STENT PLACEMENT Right 12/12/2021   Procedure: CYSTOSCOPY WITH RETROGRADE PYELOGRAM/URETERAL STENT PLACEMENT;  Surgeon: Hollice Espy, MD;  Location: [facility] ORS;  Service: Urology;  Laterality: Right;    Family History  Problem Relation Age of Onset   High blood pressure Maternal  Grandmother     Social History   Socioeconomic History   Marital status: Married    Spouse name: Not on file   Number of children: 5   Years of education: Not on file   Highest education level: Not on file  Occupational History   Not on file  Tobacco Use   Smoking status: Former    Types: Cigarettes    Quit date: 05/27/2018    Years since quitting: 3.6   Smokeless tobacco: Never  Vaping Use   Vaping Use: Never used  Substance and Sexual Activity   Alcohol use: Not Currently   Drug use: Not Currently   Sexual activity: Not Currently  Other Topics Concern   Not on file  Social History Narrative   Right handed    one story home    lives with family    Doesn't work currently   Social Determinants of Radio broadcast assistant Strain: Not on file  Food Insecurity: Not on file  Transportation Needs: Not on file  Physical Activity: Not on file  Stress: Not on file  Social Connections: Not on file  Intimate Partner Violence: Not on file    Outpatient Medications Prior to Visit  Medication Sig Dispense Refill   acetaminophen (TYLENOL) 500 MG tablet Take 500 mg by mouth every 6 (six) hours as needed for moderate pain or mild pain.     ALPRAZolam (XANAX) 0.5 MG tablet Take 1 tablet (0.5 mg total) by mouth 2 (two) times daily as needed  for anxiety. (Patient taking differently: Take 0.25 mg by mouth 2 (two) times daily as needed for anxiety. 0.25 mg daily as needed) 60 tablet 1   busPIRone (BUSPAR) 5 MG tablet Take 1 tablet (5 mg total) by mouth in the morning. 30 tablet 1   docusate sodium (COLACE) 100 MG capsule Take 1 capsule by mouth 2 (two) times daily.     ferrous sulfate 325 (65 FE) MG tablet Take 1 tablet (325 mg total) by mouth daily with breakfast. 30 tablet 0   FLUoxetine (PROZAC) 40 MG capsule Take 1 capsule (40 mg total) by mouth daily with breakfast. 90 capsule 0   lidocaine (LIDODERM) 5 % Place 1 patch onto the skin daily for 14 days. Remove & Discard patch within  12 hours or as directed by MD 14 patch 0   methimazole (TAPAZOLE) 5 MG tablet Take 1 tablet (5 mg total) by mouth daily. 30 tablet 3   metoprolol succinate (TOPROL-XL) 25 MG 24 hr tablet TAKE 1/2 TABLET BY MOUTH ONCE DAILY 45 tablet 0   mirtazapine (REMERON) 15 MG tablet Take 1 tablet (15 mg total) by mouth at bedtime. 90 tablet 0   Multiple Vitamin (MULTIVITAMIN WITH MINERALS) TABS tablet Take 1 tablet by mouth daily. 30 tablet 0   OLANZapine (ZYPREXA) 7.5 MG tablet Take 1 tablet (7.5 mg total) by mouth at bedtime. 30 tablet 1   polyethylene glycol powder (GLYCOLAX/MIRALAX) 17 GM/SCOOP powder Take 17 g by mouth as needed for mild constipation.     sodium bicarbonate 650 MG tablet Take 1 tablet (650 mg total) by mouth daily. 30 tablet 2   Vitamin D, Ergocalciferol, (DRISDOL) 1.25 MG (50000 UNIT) CAPS capsule Take 1 capsule (50,000 Units total) by mouth every 7 (seven) days. 5 capsule 0   apixaban (ELIQUIS) 5 MG TABS tablet Take 1 tablet (5 mg total) by mouth 2 (two) times daily. (Patient not taking: Reported on 12/31/2021) 60 tablet 3   No facility-administered medications prior to visit.    Allergies  Allergen Reactions   Fish Allergy     Does not eat fish   Shellfish Allergy     "I pass out"   Valium [Diazepam] Other (See Comments)    Makes her want to " fly"    ROS Review of Systems  Constitutional:  Positive for fatigue and unexpected weight change. Negative for chills and fever.  HENT:  Negative for congestion, sinus pressure, sinus pain and sore throat.   Eyes:  Negative for pain and discharge.  Respiratory:  Negative for cough and shortness of breath.   Cardiovascular:  Negative for chest pain and palpitations.  Gastrointestinal:  Positive for constipation. Negative for abdominal pain, diarrhea, nausea and vomiting.  Endocrine: Negative for polydipsia and polyuria.  Genitourinary:  Negative for dysuria and hematuria.  Musculoskeletal:  Positive for gait problem and myalgias.  Negative for neck pain and neck stiffness.  Skin:  Negative for rash.  Neurological:  Positive for tremors, weakness and numbness. Negative for dizziness.  Psychiatric/Behavioral:  Positive for agitation, behavioral problems, decreased concentration, dysphoric mood and sleep disturbance. Negative for suicidal ideas. The patient is nervous/anxious.       Objective:    Physical Exam Vitals reviewed.  Constitutional:      General: She is not in acute distress.    Appearance: She is not diaphoretic.     Comments: Has rolling walker  HENT:     Head: Normocephalic and atraumatic.     Nose: Nose  normal.     Mouth/Throat:     Mouth: Mucous membranes are moist.  Eyes:     General: No scleral icterus.    Extraocular Movements: Extraocular movements intact.  Cardiovascular:     Rate and Rhythm: Normal rate and regular rhythm.     Pulses: Normal pulses.     Heart sounds: Normal heart sounds. No murmur heard. Pulmonary:     Breath sounds: Normal breath sounds. No wheezing or rales.  Abdominal:     Palpations: Abdomen is soft.     Tenderness: There is abdominal tenderness (Mild, suprapubic).  Musculoskeletal:     Cervical back: Neck supple. No tenderness.     Right lower leg: No edema.     Left lower leg: No edema.  Skin:    General: Skin is warm.     Findings: No rash.  Neurological:     General: No focal deficit present.     Mental Status: She is alert and oriented to person, place, and time.     Motor: Weakness (3/5 in b/l UE and LE) present.     Gait: Gait abnormal (Slow, unsteady gait).  Psychiatric:        Mood and Affect: Mood normal.        Behavior: Behavior normal.     BP 132/78 (BP Location: Left Arm, Patient Position: Sitting, Cuff Size: Normal)   Pulse (!) 58   Resp 16   Ht 5' 6.5" (1.689 m)   Wt 124 lb 3.2 oz (56.3 kg)   SpO2 98%   BMI 19.75 kg/m  Wt Readings from Last 3 Encounters:  12/31/21 124 lb 3.2 oz (56.3 kg)  12/12/21 132 lb (59.9 kg)  11/21/21 132  lb 3.2 oz (60 kg)    Lab Results  Component Value Date   TSH 0.060 (L) 12/12/2021   Lab Results  Component Value Date   WBC 4.9 12/17/2021   HGB 8.2 (L) 12/17/2021   HCT 26.3 (L) 12/17/2021   MCV 84.8 12/17/2021   PLT 241 12/17/2021   Lab Results  Component Value Date   NA 143 12/17/2021   K 3.3 (L) 12/17/2021   CO2 25 12/17/2021   GLUCOSE 83 12/17/2021   BUN 18 12/17/2021   CREATININE 0.92 12/17/2021   BILITOT 0.4 12/13/2021   ALKPHOS 46 12/13/2021   AST 6 (L) 12/13/2021   ALT 8 12/13/2021   PROT 5.6 (L) 12/13/2021   ALBUMIN 2.3 (L) 12/13/2021   CALCIUM 9.3 12/17/2021   ANIONGAP 6 12/17/2021   EGFR 23 (L) 03/29/2021   Lab Results  Component Value Date   CHOL 162 10/19/2021   Lab Results  Component Value Date   HDL 46 10/19/2021   Lab Results  Component Value Date   LDLCALC 97 10/19/2021   Lab Results  Component Value Date   TRIG 102 10/19/2021   Lab Results  Component Value Date   CHOLHDL 3.5 10/19/2021   Lab Results  Component Value Date   HGBA1C 4.9 10/19/2021      Assessment & Plan:   Problem List Items Addressed This Visit       Cardiovascular and Mediastinum   Pulmonary embolus ([place])    Completed Eliquis now Followed by heme-onc. 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, nephrolithiasis and tachycardia Referred to endocrinology - will discuss with Dr Dorris Fetch      Relevant Orders   Parathyroid hormone, intact (no Ca)     Genitourinary   Hydronephrosis with obstructing calculus    S/p ureteral stent placement Followed by Urology Planned to have cystoscopy/ureteroscopy/holmium laser       Relevant Orders   CBC with Differential/Platelet   Basic Metabolic Panel (BMET)   UTI (urinary tract  infection) - Primary    Treated with IV abx followed by oral ciprofloxacin Check urine culture      Relevant Orders   Urine Culture     Other   Hospital discharge follow-up    Hospital chart reviewed, including discharge summary S/p ureteral stent placement for renal calculi Treated with abx for UTI      Relevant Orders   CBC with Differential/Platelet   Basic Metabolic Panel (BMET)   Other Visit Diagnoses     Other iron deficiency anemia       Relevant Orders   CBC with Differential/Platelet       No orders of the defined types were placed in this encounter.   Follow-up: Return if symptoms worsen or fail to improve.    Lindell Spar, MD

## 2021-12-31 NOTE — Telephone Encounter (Signed)
LVM for Amber [name] to call the office

## 2021-12-31 NOTE — Assessment & Plan Note (Signed)
S/p ureteral stent placement Followed by Urology Planned to have cystoscopy/ureteroscopy/holmium laser

## 2021-12-31 NOTE — Assessment & Plan Note (Signed)
Completed Eliquis now Followed by heme-onc. for anemia, denies any active bleeding

## 2021-12-31 NOTE — Telephone Encounter (Signed)
Verbal orders given  

## 2022-01-02 LAB — CBC WITH DIFFERENTIAL/PLATELET
Basophils Absolute: 0 10*3/uL (ref 0.0–0.2)
Basos: 1 %
EOS (ABSOLUTE): 0.1 10*3/uL (ref 0.0–0.4)
Eos: 2 %
Hematocrit: 29.4 % — ABNORMAL LOW (ref 34.0–46.6)
Hemoglobin: 9.6 g/dL — ABNORMAL LOW (ref 11.1–15.9)
Immature Grans (Abs): 0 10*3/uL (ref 0.0–0.1)
Immature Granulocytes: 0 %
Lymphocytes Absolute: 1.6 10*3/uL (ref 0.7–3.1)
Lymphs: 33 %
MCH: 27.5 pg (ref 26.6–33.0)
MCHC: 32.7 g/dL (ref 31.5–35.7)
MCV: 84 fL (ref 79–97)
Monocytes Absolute: 0.2 10*3/uL (ref 0.1–0.9)
Monocytes: 5 %
Neutrophils Absolute: 2.8 10*3/uL (ref 1.4–7.0)
Neutrophils: 59 %
Platelets: 222 10*3/uL (ref 150–450)
RBC: 3.49 x10E6/uL — ABNORMAL LOW (ref 3.77–5.28)
RDW: 15.5 % — ABNORMAL HIGH (ref 11.7–15.4)
WBC: 4.8 10*3/uL (ref 3.4–10.8)

## 2022-01-02 LAB — BASIC METABOLIC PANEL WITH GFR
BUN/Creatinine Ratio: 12 (ref 12–28)
BUN: 14 mg/dL (ref 8–27)
CO2: 23 mmol/L (ref 20–29)
Calcium: 10.1 mg/dL (ref 8.7–10.3)
Chloride: 108 mmol/L — ABNORMAL HIGH (ref 96–106)
Creatinine, Ser: 1.15 mg/dL — ABNORMAL HIGH (ref 0.57–1.00)
Glucose: 91 mg/dL (ref 70–99)
Potassium: 4.1 mmol/L (ref 3.5–5.2)
Sodium: 144 mmol/L (ref 134–144)
eGFR: 50 mL/min/1.73 — ABNORMAL LOW

## 2022-01-02 LAB — PARATHYROID HORMONE, INTACT (NO CA): PTH: 97 pg/mL — ABNORMAL HIGH (ref 15–65)

## 2022-01-07 ENCOUNTER — Other Ambulatory Visit: Payer: Self-pay | Admitting: Internal Medicine

## 2022-01-07 ENCOUNTER — Encounter
Admission: RE | Admit: 2022-01-07 | Discharge: 2022-01-07 | Disposition: A | Discharge status: Home / Self Care | Payer: Medicare Other | Source: Ambulatory Visit | Attending: Urology | Admitting: Urology

## 2022-01-07 DIAGNOSIS — N3001 Acute cystitis with hematuria: Secondary | ICD-10-CM

## 2022-01-07 HISTORY — DX: Thoracic aortic aneurysm, without rupture, unspecified: I71.20

## 2022-01-07 HISTORY — DX: Gastro-esophageal reflux disease without esophagitis: K21.9

## 2022-01-07 HISTORY — DX: Adult failure to thrive: R62.7

## 2022-01-07 HISTORY — DX: Dysphagia, unspecified: R13.10

## 2022-01-07 HISTORY — DX: Unspecified urinary incontinence: R32

## 2022-01-07 HISTORY — DX: Personal history of urinary calculi: Z87.442

## 2022-01-07 HISTORY — DX: Atherosclerosis of aorta: I70.0

## 2022-01-07 MED ORDER — CIPROFLOXACIN HCL 500 MG PO TABS: 500.0000 mg | ORAL_TABLET | Freq: Two times a day (BID) | ORAL | 0 refills | Status: DC

## 2022-01-07 NOTE — Patient Instructions (Addendum)
Your procedure is scheduled on: Monday, August 21 Report to the Registration Desk on the 1st floor of the [facility]. To find out your arrival time, please call [phone] between 1PM - 3PM on: Friday, August 18 If your arrival time is 6:00 am, do not arrive prior to that time as the [place] entrance doors do not open until 6:00 am.  REMEMBER: Instructions that are not followed completely may result in serious medical risk, up to and including death; or upon the discretion of your surgeon and anesthesiologist your surgery may need to be rescheduled.  Do not eat or drink after midnight the night before surgery.  No gum chewing, lozengers or hard candies.  TAKE THESE MEDICATIONS THE MORNING OF SURGERY WITH A SIP OF WATER:  Buspirone (Buspar) Ciprofloxacin (Cipro) Fluoxetine (Prozac) Methimazole  Metoprolol  One week prior to surgery: starting August 14 Stop Anti-inflammatories (NSAIDS) such as Advil, Aleve, Ibuprofen, Motrin, Naproxen, Naprosyn and Aspirin based products such as Excedrin, Goodys Powder, BC Powder. Stop ANY OVER THE COUNTER supplements until after surgery. You may however, continue to take Tylenol if needed for pain up until the day of surgery.  No Alcohol for 24 hours before or after surgery.  No Smoking including e-cigarettes for 24 hours prior to surgery.  No chewable tobacco products for at least 6 hours prior to surgery.  No nicotine patches on the day of surgery.  Do not use any "recreational" drugs for at least a week prior to your surgery.  Please be advised that the combination of cocaine and anesthesia may have negative outcomes, up to and including death. If you test positive for cocaine, your surgery will be cancelled.  On the morning of surgery brush your teeth with toothpaste and water, you may rinse your mouth with mouthwash if you wish. Do not swallow any toothpaste or mouthwash.  Do not wear jewelry, make-up, hairpins, clips or nail  polish.  Do not wear lotions, powders, or perfumes.   Do not shave body from the neck down 48 hours prior to surgery just in case you cut yourself which could leave a site for infection.   Contact lenses, hearing aids and dentures may not be worn into surgery.  Do not bring valuables to the hospital. [facility] is not responsible for any missing/lost belongings or valuables.   Notify your doctor if there is any change in your medical condition (cold, fever, infection).  Wear comfortable clothing (specific to your surgery type) to the hospital.  After surgery, you can help prevent lung complications by doing breathing exercises.  Take deep breaths and cough every 1-2 hours. Your doctor may order a device called an Incentive Spirometer to help you take deep breaths.  If you are being discharged the day of surgery, you will not be allowed to drive home. You will need a responsible adult (18 years or older) to drive you home and stay with you that night.   If you are taking public transportation, you will need to have a responsible adult (18 years or older) with you. Please confirm with your physician that it is acceptable to use public transportation.   Please call the [facility] Dept. at [phone] if you have any questions about these instructions.  Surgery Visitation Policy:  Patients undergoing a surgery or procedure may have two family members or support persons with them as long as the person is not COVID-19 positive or experiencing its symptoms.

## 2022-01-11 ENCOUNTER — Other Ambulatory Visit: Payer: Self-pay | Admitting: Internal Medicine

## 2022-01-11 DIAGNOSIS — N3001 Acute cystitis with hematuria: Secondary | ICD-10-CM

## 2022-01-11 LAB — URINE CULTURE

## 2022-01-11 MED ORDER — SULFAMETHOXAZOLE-TRIMETHOPRIM 800-160 MG PO TABS: 1.0000 | ORAL_TABLET | Freq: Two times a day (BID) | ORAL | 0 refills | Status: DC

## 2022-01-13 MED ORDER — LACTATED RINGERS IV SOLN: INTRAVENOUS | Status: DC

## 2022-01-13 MED ORDER — FAMOTIDINE 20 MG PO TABS: 20.0000 mg | ORAL_TABLET | Freq: Once | ORAL | Status: AC

## 2022-01-13 MED ORDER — ORAL CARE MOUTH RINSE: 15.0000 mL | Freq: Once | OROMUCOSAL | Status: AC

## 2022-01-13 MED ORDER — CEFAZOLIN SODIUM-DEXTROSE 1-4 GM/50ML-% IV SOLN
1.0000 g | INTRAVENOUS | Status: AC
  Administered 2022-01-14: 2 g via INTRAVENOUS

## 2022-01-13 MED ORDER — CHLORHEXIDINE GLUCONATE 0.12 % MT SOLN: 15.0000 mL | Freq: Once | OROMUCOSAL | Status: AC

## 2022-01-14 ENCOUNTER — Ambulatory Visit: Payer: Medicare Other | Admitting: Urgent Care

## 2022-01-14 ENCOUNTER — Ambulatory Visit
Admission: RE | Admit: 2022-01-14 | Discharge: 2022-01-14 | Disposition: A | Discharge status: Home / Self Care | Payer: Medicare Other | Attending: Urology | Admitting: Urology

## 2022-01-14 ENCOUNTER — Encounter: Payer: Self-pay | Admitting: Urology

## 2022-01-14 ENCOUNTER — Ambulatory Visit: Payer: Medicare Other

## 2022-01-14 ENCOUNTER — Encounter
Admission: RE | Disposition: A | Discharge status: Home / Self Care | Payer: Self-pay | Source: Home / Self Care | Attending: Urology

## 2022-01-14 ENCOUNTER — Other Ambulatory Visit: Payer: Self-pay

## 2022-01-14 DIAGNOSIS — Z87440 Personal history of urinary (tract) infections: Secondary | ICD-10-CM | POA: Insufficient documentation

## 2022-01-14 DIAGNOSIS — Z86711 Personal history of pulmonary embolism: Secondary | ICD-10-CM | POA: Diagnosis not present

## 2022-01-14 DIAGNOSIS — I119 Hypertensive heart disease without heart failure: Secondary | ICD-10-CM | POA: Diagnosis not present

## 2022-01-14 DIAGNOSIS — Z86718 Personal history of other venous thrombosis and embolism: Secondary | ICD-10-CM | POA: Diagnosis not present

## 2022-01-14 DIAGNOSIS — Z87891 Personal history of nicotine dependence: Secondary | ICD-10-CM | POA: Insufficient documentation

## 2022-01-14 DIAGNOSIS — N136 Pyonephrosis: Secondary | ICD-10-CM | POA: Insufficient documentation

## 2022-01-14 DIAGNOSIS — N201 Calculus of ureter: Secondary | ICD-10-CM

## 2022-01-14 DIAGNOSIS — Z8673 Personal history of transient ischemic attack (TIA), and cerebral infarction without residual deficits: Secondary | ICD-10-CM | POA: Insufficient documentation

## 2022-01-14 DIAGNOSIS — N1330 Unspecified hydronephrosis: Secondary | ICD-10-CM | POA: Diagnosis not present

## 2022-01-14 HISTORY — PX: CYSTOSCOPY/URETEROSCOPY/HOLMIUM LASER/STENT PLACEMENT: SHX6546

## 2022-01-14 SURGERY — CYSTOSCOPY/URETEROSCOPY/HOLMIUM LASER/STENT PLACEMENT
Anesthesia: General | Laterality: Right

## 2022-01-14 MED ORDER — MIDAZOLAM HCL 2 MG/2ML IJ SOLN
INTRAMUSCULAR | Status: AC
  Filled 2022-01-14: qty 2

## 2022-01-14 MED ORDER — FAMOTIDINE 20 MG PO TABS
ORAL_TABLET | ORAL | Status: AC
  Administered 2022-01-14: 20 mg via ORAL
  Filled 2022-01-14: qty 1

## 2022-01-14 MED ORDER — ONDANSETRON HCL 4 MG/2ML IJ SOLN
INTRAMUSCULAR | Status: DC | PRN
  Administered 2022-01-14: 4 mg via INTRAVENOUS

## 2022-01-14 MED ORDER — LIDOCAINE HCL (CARDIAC) PF 100 MG/5ML IV SOSY
PREFILLED_SYRINGE | INTRAVENOUS | Status: DC | PRN
  Administered 2022-01-14: 50 mg via INTRAVENOUS

## 2022-01-14 MED ORDER — PROPOFOL 10 MG/ML IV BOLUS
INTRAVENOUS | Status: DC | PRN
  Administered 2022-01-14: 140 mg via INTRAVENOUS

## 2022-01-14 MED ORDER — CEFAZOLIN SODIUM-DEXTROSE 1-4 GM/50ML-% IV SOLN
INTRAVENOUS | Status: AC
  Filled 2022-01-14: qty 50

## 2022-01-14 MED ORDER — MIDAZOLAM HCL 2 MG/2ML IJ SOLN
INTRAMUSCULAR | Status: DC | PRN
  Administered 2022-01-14 (×2): 1 mg via INTRAVENOUS

## 2022-01-14 MED ORDER — GLYCOPYRROLATE 0.2 MG/ML IJ SOLN
INTRAMUSCULAR | Status: DC | PRN
  Administered 2022-01-14: .2 mg via INTRAVENOUS

## 2022-01-14 MED ORDER — SODIUM CHLORIDE 0.9 % IR SOLN
Status: DC | PRN
  Administered 2022-01-14: 3000 mL

## 2022-01-14 MED ORDER — EPHEDRINE SULFATE (PRESSORS) 50 MG/ML IJ SOLN
INTRAMUSCULAR | Status: DC | PRN
  Administered 2022-01-14: 5 mg via INTRAVENOUS

## 2022-01-14 MED ORDER — CHLORHEXIDINE GLUCONATE 0.12 % MT SOLN
OROMUCOSAL | Status: AC
  Administered 2022-01-14: 15 mL via OROMUCOSAL
  Filled 2022-01-14: qty 15

## 2022-01-14 MED ORDER — FENTANYL CITRATE (PF) 100 MCG/2ML IJ SOLN
INTRAMUSCULAR | Status: AC
  Filled 2022-01-14: qty 2

## 2022-01-14 MED ORDER — DEXAMETHASONE SODIUM PHOSPHATE 10 MG/ML IJ SOLN
INTRAMUSCULAR | Status: DC | PRN
  Administered 2022-01-14: 8 mg via INTRAVENOUS

## 2022-01-14 MED ORDER — IOHEXOL 180 MG/ML  SOLN
INTRAMUSCULAR | Status: DC | PRN
  Administered 2022-01-14: 10 mL

## 2022-01-14 SURGICAL SUPPLY — 20 items
BAG DRAIN SIEMENS DORNER NS (MISCELLANEOUS) ×2 IMPLANT
BAG DRN NS LF (MISCELLANEOUS) ×1
BRUSH SCRUB EZ  4% CHG (MISCELLANEOUS) ×1
BRUSH SCRUB EZ 4% CHG (MISCELLANEOUS) ×1 IMPLANT
CATH URET FLEX-TIP 2 LUMEN 10F (CATHETERS) ×1 IMPLANT
CATH URETL OPEN 5X70 (CATHETERS) ×2 IMPLANT
DRAPE UTILITY 15X26 TOWEL STRL (DRAPES) ×2 IMPLANT
FIBER LASER MOSES 200 DFL (Laser) ×1 IMPLANT
GLOVE BIO SURGEON STRL SZ 6.5 (GLOVE) ×2 IMPLANT
GOWN STRL REUS W/ TWL LRG LVL3 (GOWN DISPOSABLE) ×4 IMPLANT
GOWN STRL REUS W/TWL LRG LVL3 (GOWN DISPOSABLE) ×2
GUIDEWIRE STR DUAL SENSOR (WIRE) ×2 IMPLANT
IV NS IRRIG 3000ML ARTHROMATIC (IV SOLUTION) ×2 IMPLANT
KIT TURNOVER CYSTO (KITS) ×2 IMPLANT
PACK CYSTO AR (MISCELLANEOUS) ×2 IMPLANT
SET CYSTO W/LG BORE CLAMP LF (SET/KITS/TRAYS/PACK) ×2 IMPLANT
STENT URET 6FRX26 CONTOUR (STENTS) ×1 IMPLANT
SURGILUBE 2OZ TUBE FLIPTOP (MISCELLANEOUS) ×2 IMPLANT
WATER STERILE IRR 1000ML POUR (IV SOLUTION) ×2 IMPLANT
WATER STERILE IRR 500ML POUR (IV SOLUTION) ×2 IMPLANT

## 2022-01-14 NOTE — Op Note (Signed)
Date of procedure: 01/14/22  Preoperative diagnosis:  Right proximal ureteral calculus x2  Postoperative diagnosis:  Same as above  Procedure: Right ureteroscopy with laser lithotripsy Right ureteral stent exchange Right retrograde pyelogram Interpretation of fluoroscopy less than 30 minutes  Surgeon: Hollice Espy, MD  Anesthesia: General  Complications: None  Intraoperative findings: 2 proximal ureteral calculi identified, most proximal of which had moderate ureteral edema adjacent.  Some mild stent encrustation noted.  EBL: Minimal  Specimens: None  Drains: 6 x 26 French double-J ureteral stent on right  Indication: Amber [name] is a 76 y.o. patient with infected right proximal ureteral calculi x2 status post stent who returns today for definitive management.  Preoperative urine culture was positive and was treated..  After reviewing the management options for treatment, she elected to proceed with the above surgical procedure(s). We have discussed the potential benefits and risks of the procedure, side effects of the proposed treatment, the likelihood of the patient achieving the goals of the procedure, and any potential problems that might occur during the procedure or recuperation. Informed consent has been obtained.  Description of procedure:  The patient was taken to the operating room and general anesthesia was induced.  The patient was placed in the dorsal lithotomy position, prepped and draped in the usual sterile fashion, and preoperative antibiotics were administered. A preoperative time-out was performed.   A 21 French scope was advanced per urethra into the bladder.  Attention was turned to the right side where the ureteral stent was seen emanating.  There was some mild encrustation and biofilm on the previous stent.  The distal coil was grasped and brought to level the urethral meatus.  I was able to milk the stone sludge out of the lumen of the distal coil and  cannulate the stent using a sensor wire up to the level of the kidney.  The wire was left in place and the stent was removed.  A dual-lumen access sheath was then used over the wire to the mid ureter.  A retrograde pyelogram on the side showed some filling defects in the proximal ureter where the stone had been previously lodged.  There was minimal hydronephrosis at this point.  A Super Stiff wire was then advanced to the kidney.  This was used as a working wire.  I advanced a 12/14 French ureteral access sheath to the mid ureter under fluoroscopic guidance which went easily.  An 8 French dual-lumen digital flexible ureteroscope was then advanced to the proximal ureter where there were 2 stones were encountered.  The more proximal of the 2 2 stones had some periureteral edema.  A 242 m laser fiber was then brought in using dusting settings of 0.3 J and 60 Hz, the stones were dusted.  A few of these pieces retropulsed into the kidney were chased and further fragmented into dust like material.  I then reinspected the ureter.  Additional fragments were obliterated.  I then backed the scope dilate the ureter.  The distal ureter, there was a small stone fragment, possibly from stent encrustation which was grasped and removed.  There was no ureteral injuries appreciated.  There is no contrast extravasation.  A 6 x 26 French double-J ureteral stent was advanced over the safety wire up to the level of the kidney.  Once it was removed, there was a full coil noted both within the bladder as well as within the kidney.  The bladder was then drained using the scope sheath.  The  patient was then cleaned and dried, repositioned in supine position, reversed of anesthesia, and taken to the PACU in stable condition.  Plan: We will have her return to the office next week for cystoscopy, stent removal.  No additional prescriptions were supplied.  She should complete her antibiotics as prescribed.  Hollice Espy, M.D.

## 2022-01-14 NOTE — Transfer of Care (Signed)
Immediate Anesthesia Transfer of Care Note  Patient: Amber [name]  Procedure(s) Performed: CYSTOSCOPY/URETEROSCOPY/HOLMIUM LASER/STENT EXCHANGE (Right)  Patient Location: PACU  Anesthesia Type:General  Level of Consciousness: drowsy  Airway & Oxygen Therapy: Patient Spontanous Breathing and Patient connected to face mask oxygen  Post-op Assessment: Report given to RN and Post -op Vital signs reviewed and stable  Post vital signs: Reviewed and stable  Last Vitals:  Vitals Value Taken Time  BP 156/72 01/14/22 1315  Temp    Pulse 53 01/14/22 1320  Resp 18 01/14/22 1320  SpO2 99 % 01/14/22 1320  Vitals shown include unvalidated device data.  Last Pain:  Vitals:   01/14/22 0922  TempSrc: Temporal  PainSc: 0-No pain         Complications: No notable events documented.

## 2022-01-14 NOTE — Anesthesia Procedure Notes (Signed)
Procedure Name: LMA Insertion Date/Time: 01/14/2022 12:37 PM  Performed by: Beverely Low, CRNAPre-anesthesia Checklist: Patient identified, Patient being monitored, Timeout performed, Emergency Drugs available and Suction available Patient Re-evaluated:Patient Re-evaluated prior to induction Oxygen Delivery Method: Circle system utilized Preoxygenation: Pre-oxygenation with 100% oxygen Induction Type: IV induction Ventilation: Mask ventilation without difficulty LMA: LMA inserted LMA Size: 4.0 Tube type: Oral Number of attempts: 1 Placement Confirmation: positive ETCO2 and breath sounds checked- equal and bilateral Tube secured with: Tape Dental Injury: Teeth and Oropharynx as per pre-operative assessment

## 2022-01-14 NOTE — H&P (Signed)
01/14/22  H&P up to date  Preop +Ucx, currently on Bactrim  Blood thinners held   Urology Consult   I have been asked to see the patient by Dr. Tamala [name], for evaluation and management of right proximal ureteral calculi.   Chief Complaint: Failure to thrive   History of Present Illness: Amber [name] is a 76 y.o. year old female with personal history of what sounds like microscopic hematuria, recurrent UTIs and known right nonobstructing kidney stone followed by Dr. Milford [name] in [place] as an outpatient who presented with several weeks of failure to thrive.  Per family, she stopped eating and has become more lethargic and less active.  This was similar to her presentation earlier this year when she was admitted for psychiatric issues.  She denies any flank pain, dysuria, gross hematuria, fevers or chills.  In the emergency room, she is a low-grade temp of 99, nontachycardic, borderline leukocytosis of 13, frankly positive urinalysis and CT scan demonstrating 2 right proximal ureteral calculi with hydronephrosis.  There is tortuosity of the left proximal ureter with dilation/hydronephrosis and hydroureter.  History today is primarily provided by the patient's daughter and husband who are at bedside today.  They report that Dr. 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  She had what sounds like an office cystoscopy.  She did also have a urinary tract infection earlier this year but is not particularly prone to these prior to this.  Amber [name] herself has minimal complaints.  She is not having any pain.  She denies any urinary tract symptoms.  She has had some nausea and vomiting along with poor p.o. intake.  She also has generalized weakness.       Past Medical History:  Diagnosis Date   ABLA (acute blood loss anemia) 06/19/2021   Anxiety      Aspiration pneumonia of right lung due to gastric secretions (HCC)     C. difficile colitis 07/13/2021   COVID-19 virus infection 06/08/2021     Depression     Hypertension     Kidney disease     Stroke [facility])        History reviewed. No pertinent surgical history.   Home Medications:  Active Medications  No outpatient medications have been marked as taking for the 12/12/21 encounter [facility] Encounter).        Allergies:       Allergies  Allergen Reactions   Shellfish Allergy        "I pass out"   Valium [Diazepam] Other (See Comments)      Makes her want to " fly"           Family History  Problem Relation Age of Onset   High blood pressure Maternal Grandmother        Social History:  reports that she has quit smoking. Her smoking use included cigarettes. She has never used smokeless tobacco. She reports that she does not currently use alcohol. She reports that she does not currently use drugs.   ROS: A complete review of systems was performed.  All systems are negative except for pertinent findings as noted.   Physical Exam:  Vital signs in last 24 hours: Temp:  [99.1 F (37.3 C)] 99.1 F (37.3 C) (07/19 1507) Pulse Rate:  [73-77] 77 (07/19 1649) Resp:  [16-28] 17 (07/19  1649) BP: (157-158)/(75-82) 158/75 (07/19 1649) SpO2:  [98 %-99 %] 98 % (07/19 1649) Weight:  [59.9 kg] 59.9 kg (07/19 1510) Constitutional:  Alert and oriented, No acute distress HEENT: [place] AT, moist mucus membranes.  Trachea midline, no masses Cardiovascular: Regular rate and rhythm, no clubbing, cyanosis, or edema. Respiratory: Normal respiratory effort, lungs clear bilaterally GI: Abdomen is soft, nontender, nondistended, no abdominal masses GU: No CVA tenderness Skin: No rashes, bruises or suspicious lesions Lymph: No cervical or inguinal adenopathy Neurologic: Grossly intact, no focal deficits, moving all 4 extremities Psychiatric: Normal mood and affect     Laboratory Data:  Recent  Labs (last 2 labs)      Recent Labs    12/12/21 1515  WBC 13.4*  HGB 9.3*  HCT 29.9*      Recent Labs (last 2 labs)      Recent Labs    12/12/21 1515  NA 140  K 2.9*  CL 109  CO2 24  GLUCOSE 142*  BUN 24*  CREATININE 1.28*  CALCIUM 9.8      Component     Latest Ref Rng 12/12/2021  Color, Urine     YELLOW  YELLOW !   Appearance     CLEAR  TURBID !   Specific Gravity, Urine     1.005 - 1.030  1.014   pH     5.0 - 8.0  6.0   Glucose, UA     NEGATIVE mg/dL NEGATIVE   Hgb urine dipstick     NEGATIVE  MODERATE !   Bilirubin Urine     NEGATIVE  NEGATIVE   Ketones, ur     NEGATIVE mg/dL NEGATIVE   Protein     NEGATIVE mg/dL 100 !   Nitrite     NEGATIVE  POSITIVE !   Leukocytes,Ua     NEGATIVE  MODERATE !   RBC / HPF     0 - 5 RBC/hpf >50 (H)   WBC, UA     0 - 5 WBC/hpf >50 (H)   Bacteria, UA     NONE SEEN  MANY !   Squamous Epithelial / LPF     0 - 5  NONE SEEN   Mucus PRESENT   WBC Clumps PRESENT   Budding Yeast PRESENT     Legend: ! Abnormal (H) High   Radiologic Imaging: CT ABDOMEN PELVIS W CONTRAST   Result Date: 12/12/2021 CLINICAL DATA:  Acute nonlocalized abdominal pain. EXAM: CT ABDOMEN AND PELVIS WITH CONTRAST TECHNIQUE: Multidetector CT imaging of the abdomen and pelvis was performed using the standard protocol following bolus administration of intravenous contrast. RADIATION DOSE REDUCTION: This exam was performed according to the departmental dose-optimization program which includes automated exposure control, adjustment of the mA and/or kV according to patient size and/or use of iterative reconstruction technique. CONTRAST:  53m OMNIPAQUE IOHEXOL 300 MG/ML  SOLN COMPARISON:  June 14, 2021 FINDINGS: Lower chest: Enlarged heart. Calcific atherosclerotic disease of the coronary arteries. Hepatobiliary: Left liver cyst. Normal appearance of the gallbladder. Pancreas: Unremarkable. No pancreatic ductal dilatation or surrounding inflammatory  changes. Spleen: Normal in size without focal abnormality. Adrenals/Urinary Tract: Normal adrenal glands. Partially exophytic left renal cyst, stable. Again seen is obstructing ureteral stone in the right UPJ measuring 1.1 cm. A second 8 mm calculus is located within the proximal right ureter slightly distal to the UPJ where the ureter has a reverse S shaped configuration. There is right perinephric fat stranding. Stomach/Bowel: Stomach is within normal limits.  Appendix appears normal. No evidence of bowel wall thickening, distention, or inflammatory changes. Vascular/Lymphatic: Aortic atherosclerosis. No enlarged abdominal or pelvic lymph nodes. 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 Coarse calcifications within the right adnexa. Other: No abdominal wall hernia or abnormality. No abdominopelvic ascites. Musculoskeletal: No acute or significant osseous findings. IMPRESSION: 1. Two obstructing ureteral calculi in the right UPJ and proximal right ureter, with reverse S shaped configuration of the proximal right ureter. Resultant severe right hydronephrosis and right perinephric stranding. 2. 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 3. Enlarged heart. 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 Aortic Atherosclerosis (ICD10-I70.0). Electronically Signed   By: Fidela Salisbury M.D.   On: 12/12/2021 16:40    CT of the abdomen pelvis was personally reviewed, agree with radiologic interpretation.   Impression/plan:   1.  Right proximal ureteral calculi x2-based on the size and configuration as well as obstruction, eventually she will need definitive management of her stone, likely ureteroscopy.  In the setting of urinary tract  infection as well as mother nonspecific symptoms including nausea vomiting and failure to thrive, I do believe that she would be best served with ureteral stent placement for urinary decompression.  This will help decompress her kidney, clear the infection potentially as well as help facilitate future ureteroscopy as current configuration appears to be quite complex with tortuosity of the proximal ureter which may limit success with primary ureteroscopy without prestent.   Discussed the risk and benefits of ureteral stent placement today.  We discussed the need for further procedures down the road.  We discussed the risk of seeing blood in the urine, the possibility of failure with stent placement especially given current stone configuration and anatomy and possibilities of needing a percutaneous nephrostomy tube if we fail today.  All questions were answered.  Per her family, she does make her own medical decisions and she agrees with this plan as well.  Her husband and daughter also agree.  All questions were answered.  She was site marked on the right and consent was obtained.  2.  UTI-previously grew Pseudomonas which was pansensitive.  She has received ceftriaxone but this would not be sufficient for Pseudomonas.  Will broaden and give additional dose of Zosyn periop.  F/u urine culture.    Hollice Espy,  MD

## 2022-01-14 NOTE — Anesthesia Preprocedure Evaluation (Signed)
Anesthesia Evaluation  Patient identified by MRN, date of birth, ID band Patient awake    Reviewed: Allergy & Precautions, NPO status , Patient's Chart, lab work & pertinent test results  History of Anesthesia Complications Negative for: history of anesthetic complications  Airway Mallampati: III  TM Distance: >3 FB Neck ROM: full  Mouth opening: Limited Mouth Opening  Dental  (+) Dental Advidsory Given, Upper Dentures, Lower Dentures, Missing   Pulmonary neg pulmonary ROS, neg shortness of breath, asthma , neg sleep apnea, neg COPD, neg recent URI, former smoker,    Pulmonary exam normal  + decreased breath sounds      Cardiovascular Exercise Tolerance: Poor hypertension, Pt. on medications (-) angina(-) Past MI and (-) Cardiac Stents negative cardio ROS Normal cardiovascular exam+ dysrhythmias (-) Valvular Problems/Murmurs Rate:Tachycardia     Neuro/Psych neg Seizures PSYCHIATRIC DISORDERS Anxiety Depression CVA, No Residual Symptoms negative neurological ROS  negative psych ROS   GI/Hepatic negative GI ROS, Neg liver ROS, GERD  ,  Endo/Other  negative endocrine ROSneg diabetesHyperthyroidism   Renal/GU CRFRenal disease (kidney stone)     Musculoskeletal negative musculoskeletal ROS (+)   Abdominal Normal abdominal exam  (+)   Peds negative pediatric ROS (+)  Hematology negative hematology ROS (+) Blood dyscrasia, anemia ,   Anesthesia Other Findings Past Medical History: No date: Anxiety No date: Hypertension   Reproductive/Obstetrics negative OB ROS                             Anesthesia Physical  Anesthesia Plan  ASA: 3 and emergent  Anesthesia Plan: General   Post-op Pain Management:    Induction: Intravenous, Rapid sequence and Cricoid pressure planned  PONV Risk Score and Plan: 3 and Ondansetron, Dexamethasone and Treatment may vary due to age or medical  condition  Airway Management Planned: LMA  Additional Equipment:   Intra-op Plan:   Post-operative Plan: Extubation in OR  Informed Consent: I have reviewed the patients History and Physical, chart, labs and discussed the procedure including the risks, benefits and alternatives for the proposed anesthesia with the patient or authorized representative who has indicated his/her understanding and acceptance.     Dental Advisory Given  Plan Discussed with: CRNA and Surgeon  Anesthesia Plan Comments: (Patient consented for risks of anesthesia including but not limited to:  - adverse reactions to medications - damage to eyes, teeth, lips or other oral mucosa - nerve damage due to positioning  - sore throat or hoarseness - Damage to heart, brain, nerves, lungs, other parts of body or loss of life  Patient voiced understanding.)        Anesthesia Quick Evaluation

## 2022-01-14 NOTE — Anesthesia Postprocedure Evaluation (Signed)
Anesthesia Post Note  Patient: Amber [name]  Procedure(s) Performed: CYSTOSCOPY/URETEROSCOPY/HOLMIUM LASER/STENT EXCHANGE (Right)  Patient location during evaluation: PACU Anesthesia Type: General Level of consciousness: awake and alert Pain management: pain level controlled Vital Signs Assessment: post-procedure vital signs reviewed and stable Respiratory status: spontaneous breathing, nonlabored ventilation, respiratory function stable and patient connected to nasal cannula oxygen Cardiovascular status: blood pressure returned to baseline and stable Postop Assessment: no apparent nausea or vomiting Anesthetic complications: no   No notable events documented.   Last Vitals:  Vitals:   01/14/22 1335 01/14/22 1340  BP:  (!) 185/87  Pulse: 65 68  Resp: 19 16  Temp:  (!) 36.1 C  SpO2: 100% 98%    Last Pain:  Vitals:   01/14/22 1330  TempSrc:   PainSc: 0-No pain                 Dimas Millin

## 2022-01-14 NOTE — Discharge Instructions (Addendum)
You have a ureteral stent in place.  This is a tube that extends from your kidney to your bladder.  This may cause urinary bleeding, burning with urination, and urinary frequency.  Please call our office or present to the ED if you develop fevers >101 or pain which is not able to be controlled with oral pain medications.  You may be given either Flomax and/ or ditropan to help with bladder spasms and stent pain in addition to pain medications.    [facility] [address], [address] [place], [place] [address] [phone]  AMBULATORY SURGERY  DISCHARGE INSTRUCTIONS   The drugs that you were given will stay in your system until tomorrow so for the next 24 hours you should not:  Drive an automobile Make any legal decisions Drink any alcoholic beverage   You may resume regular meals tomorrow.  Today it is better to start with liquids and gradually work up to solid foods.  You may eat anything you prefer, but it is better to start with liquids, then soup and crackers, and gradually work up to solid foods.   Please notify your doctor immediately if you have any unusual bleeding, trouble breathing, redness and pain at the surgery site, drainage, fever, or pain not relieved by medication.    Additional Instructions:   Please contact your physician with any problems or Same Day Surgery at [phone], Monday through Friday 6 am to 4 pm, or [place] [facility] [place] Main number at [phone].  

## 2022-01-15 ENCOUNTER — Telehealth (INDEPENDENT_AMBULATORY_CARE_PROVIDER_SITE_OTHER): Payer: Medicare Other | Admitting: Psychiatry

## 2022-01-15 ENCOUNTER — Encounter: Payer: Self-pay | Admitting: Urology

## 2022-01-15 DIAGNOSIS — F333 Major depressive disorder, recurrent, severe with psychotic symptoms: Secondary | ICD-10-CM

## 2022-01-15 NOTE — Progress Notes (Signed)
Patient did not connect at the time of appointment.  Writer contacted daughter who picked up the phone and said she got the time mixed up and was thinking her appointment was at 10:30.  Agrees to contact to reschedule the appointment.

## 2022-01-16 ENCOUNTER — Telehealth (INDEPENDENT_AMBULATORY_CARE_PROVIDER_SITE_OTHER): Payer: Medicare Other | Admitting: Psychiatry

## 2022-01-16 ENCOUNTER — Encounter: Payer: Self-pay | Admitting: Psychiatry

## 2022-01-16 DIAGNOSIS — F333 Major depressive disorder, recurrent, severe with psychotic symptoms: Secondary | ICD-10-CM | POA: Diagnosis not present

## 2022-01-16 DIAGNOSIS — F411 Generalized anxiety disorder: Secondary | ICD-10-CM

## 2022-01-16 MED ORDER — FLUOXETINE HCL 20 MG PO CAPS: 20.0000 mg | ORAL_CAPSULE | Freq: Every day | ORAL | 0 refills | Status: DC

## 2022-01-16 MED ORDER — OLANZAPINE 7.5 MG PO TABS: 7.5000 mg | ORAL_TABLET | Freq: Every day | ORAL | 0 refills | Status: DC

## 2022-01-16 NOTE — Progress Notes (Unsigned)
Virtual Visit via Video Note  I connected with Amber [name] on 01/16/22 at  9:00 AM EDT by a video enabled telemedicine application and verified that I am speaking with the correct person using two identifiers.  Location Provider Location : [facility] Patient Location : Home  Participants: Patient , Daughter ,Provider    I discussed the limitations of evaluation and management by telemedicine and the availability of in person appointments. The patient expressed understanding and agreed to proceed.   I discussed the assessment and treatment plan with the patient. The patient was provided an opportunity to ask questions and all were answered. The patient agreed with the plan and demonstrated an understanding of the instructions.   The patient was advised to call back or seek an in-person evaluation if the symptoms worsen or if the condition fails to improve as anticipated.                                                                 [place] MD OP Progress Note  01/17/2022 9:19 AM Amber [name]  MRN:  [id number]  Chief Complaint:  Chief Complaint  Patient presents with   Follow-up: 76 year old Caucasian female with history of depression, anxiety, thyroid abnormalities, presented for medication management.   HPI: Amber [name] is a 76 year old Caucasian female, married, lives in [place], has a history of MDD, GAD, hypothyroidism, chronic kidney disease, secondary hyperparathyroidism was evaluated by telemedicine today.  Patient as well as [name] participated in the evaluation today.  Patient being a limited historian-daughter provided collateral information.  Patient with recent urinary tract infection with right nonobstructing kidney stones, CT scan showed proximal right sided ureteral calculi with hydronephrosis.  Reviewed notes per Dr. Pennie Rushing 01/14/2022-patient had right ureteroscopy with laser lithotripsy, right ureteral stent exchange.    Per daughter patient is currently  on Bactrim.  Continues to recover although she continues to be in pain and is nauseous.  She however is eating better although continues to complain of difficulty swallowing.  Primary care provider is referring her to a gastroenterologist for further management.  Patient today appeared to be alert, oriented to person place situation, reports she is currently taking her medications.  Reports sleep is overall good.  However continues to be anxious.  Daughter also agrees that patient continues to have ' fear of being left alone, fear of dying".  Patient has been limiting the use of Xanax however yesterday took 1 dosage since she was anxious about " dying ".  Patient does have upcoming appointment with therapist, plans to see the therapist once a week or so.  Denies any current suicidality or homicidality.  Patient does report possibly seeing shadows on and off last time may have been yesterday however not sure if she was imagining it or was seeing it for sure.  Will need to explore this in future sessions.  Denies any auditory hallucinations.  Does report some tongue movement however patient reports she is trying to moisten her lips since she has dry mouth.  Patient as well as daughter denies any other concerns today.  Visit Diagnosis:    ICD-10-CM   1. MDD (major depressive disorder), recurrent, severe, with psychosis (HCC)  F33.3 FLUoxetine (PROZAC) 20 MG capsule    OLANZapine (ZYPREXA) 7.5  MG tablet    2. GAD (generalized anxiety disorder)  F41.1 FLUoxetine (PROZAC) 20 MG capsule      Past Psychiatric History: Reviewed past psychiatric history from progress note on 09/25/2021.  Patient was admitted to internal medicine service at [facility] for failure to thrive in January 2023-transferred to [facility] for ECT treatment in February 2023.  Past Medical History:  Past Medical History:  Diagnosis Date   ABLA (acute blood loss anemia) 06/19/2021   Anxiety    Aortic atherosclerosis (HCC)     Aspiration pneumonia of right lung due to gastric secretions (HCC)    C. difficile colitis 07/13/2021   COVID-19 virus infection 06/08/2021    Depression    DVT of leg (deep venous thrombosis) ([place]) 05/2021   Dysphagia    Failure to thrive in adult    GERD (gastroesophageal reflux disease)    History of kidney stones    right ureter   Hypertension    Hyperthyroidism    Kidney disease    Pulmonary embolism ([place]) 05/2021   Stroke ([place])    showed up in MRI   Thoracic aortic aneurysm [facility])    Urinary incontinence     Past Surgical History:  Procedure Laterality Date   CYSTOSCOPY W/ URETERAL STENT PLACEMENT Right 12/12/2021   Procedure: CYSTOSCOPY WITH RETROGRADE PYELOGRAM/URETERAL STENT PLACEMENT;  Surgeon: Hollice Espy, MD;  Location: [facility] ORS;  Service: Urology;  Laterality: Right;   CYSTOSCOPY/URETEROSCOPY/HOLMIUM LASER/STENT PLACEMENT Right 01/14/2022   Procedure: CYSTOSCOPY/URETEROSCOPY/HOLMIUM LASER/STENT EXCHANGE;  Surgeon: Hollice Espy, MD;  Location: [facility] ORS;  Service: Urology;  Laterality: Right;    Family Psychiatric History: Reviewed family psychiatric history from progress note on 09/25/2021.  Family History:  Family History  Problem Relation Age of Onset   High blood pressure Maternal Grandmother     Social History: Reviewed social history from progress note on 09/25/2021. Social History   Socioeconomic History   Marital status: Married    Spouse name: Amber [name]   Number of children: 5   Years of education: Not on file   Highest education level: Not on file  Occupational History   Not on file  Tobacco Use   Smoking status: Former    Types: Cigarettes    Quit date: 05/27/2018    Years since quitting: 3.6   Smokeless tobacco: Never  Vaping Use   Vaping Use: Never used  Substance and Sexual Activity   Alcohol use: Not Currently   Drug use: Not Currently   Sexual activity: Not Currently  Other Topics Concern   Not on file  Social History Narrative   Right  handed    one story home    lives with family    Doesn't work currently   Social Determinants of Radio broadcast assistant Strain: Not on file  Food Insecurity: Not on file  Transportation Needs: Not on file  Physical Activity: Not on file  Stress: Not on file  Social Connections: Not on file    Allergies:  Allergies  Allergen Reactions   Fish Allergy     Does not eat fish   Shellfish Allergy     "I pass out"   Valium [Diazepam] Other (See Comments)    Makes her want to " fly"    Metabolic Disorder Labs: Lab Results  Component Value Date   HGBA1C 4.9 10/19/2021   Lab Results  Component Value Date   PROLACTIN 20.6 10/19/2021   Lab Results  Component Value Date   CHOL 162  10/19/2021   TRIG 102 10/19/2021   HDL 46 10/19/2021   CHOLHDL 3.5 10/19/2021   LDLCALC 97 10/19/2021   Lab Results  Component Value Date   TSH 0.060 (L) 12/12/2021   TSH 0.022 (L) 08/30/2021    Therapeutic Level Labs: No results found for: "LITHIUM" No results found for: "VALPROATE" No results found for: "CBMZ"  Current Medications: Current Outpatient Medications  Medication Sig Dispense Refill   acetaminophen (TYLENOL) 500 MG tablet Take 500 mg by mouth every 6 (six) hours as needed for moderate pain or mild pain.     ALPRAZolam (XANAX) 0.5 MG tablet Take 1 tablet (0.5 mg total) by mouth 2 (two) times daily as needed for anxiety. (Patient taking differently: Take 0.5 mg by mouth 2 (two) times daily as needed for anxiety. Currently limiting use) 60 tablet 1   docusate sodium (COLACE) 100 MG capsule Take 1 capsule by mouth 2 (two) times daily.     ferrous sulfate 325 (65 FE) MG tablet Take 1 tablet (325 mg total) by mouth daily with breakfast. 30 tablet 0   FLUoxetine (PROZAC) 20 MG capsule Take 1 capsule (20 mg total) by mouth daily with breakfast. Take along with 40 mg to make it 60 mg daily 90 capsule 0   FLUoxetine (PROZAC) 40 MG capsule Take 1 capsule (40 mg total) by mouth daily  with breakfast. 90 capsule 0   methimazole (TAPAZOLE) 5 MG tablet Take 1 tablet (5 mg total) by mouth daily. 30 tablet 3   metoprolol succinate (TOPROL-XL) 25 MG 24 hr tablet TAKE 1/2 TABLET BY MOUTH ONCE DAILY 45 tablet 0   mirtazapine (REMERON) 15 MG tablet Take 1 tablet (15 mg total) by mouth at bedtime. 90 tablet 0   Multiple Vitamin (MULTIVITAMIN WITH MINERALS) TABS tablet Take 1 tablet by mouth daily. 30 tablet 0   polyethylene glycol powder (GLYCOLAX/MIRALAX) 17 GM/SCOOP powder Take 17 g by mouth as needed for mild constipation.     sodium bicarbonate 650 MG tablet Take 1 tablet (650 mg total) by mouth daily. 30 tablet 2   sulfamethoxazole-trimethoprim (BACTRIM DS) 800-160 MG tablet Take 1 tablet by mouth 2 (two) times daily. 10 tablet 0   Vitamin D, Ergocalciferol, (DRISDOL) 1.25 MG (50000 UNIT) CAPS capsule Take 1 capsule (50,000 Units total) by mouth every 7 (seven) days. 5 capsule 0   OLANZapine (ZYPREXA) 7.5 MG tablet Take 1 tablet (7.5 mg total) by mouth at bedtime. 90 tablet 0   No current facility-administered medications for this visit.     Musculoskeletal: Strength & Muscle Tone:  UTA Gait & Station:  Seated Patient leans: N/A  Psychiatric Specialty Exam: Review of Systems  Gastrointestinal:  Positive for abdominal pain and nausea.  Psychiatric/Behavioral:  The patient is nervous/anxious.   All other systems reviewed and are negative.   There were no vitals taken for this visit.There is no height or weight on file to calculate BMI.  General Appearance: Casual  Eye Contact:  Good  Speech:  Clear and Coherent  Volume:  Normal  Mood:  Anxious  Affect:  Congruent  Thought Process:  Goal Directed and Descriptions of Associations: Intact  Orientation:  Other:  person, place, situation  Thought Content: Logical   Suicidal Thoughts:  No  Homicidal Thoughts:  No  Memory:  Immediate;   Fair Recent;   Fair Remote;   Limited  Judgement:  Fair  Insight:  Fair   Psychomotor Activity:  Normal  Concentration:  Concentration: Fair and  Attention Span: Fair  Recall:  [employer] of Knowledge: Fair  Language: Fair  Akathisia:  No  Handed:  Right  AIMS (if indicated): done  Assets:  Communication Skills Desire for Improvement Housing Social Support  ADL's:  Intact  Cognition: baseline  Sleep:   improved   Screenings: [place] Video Visit from 01/16/2022 in [place] Office Visit from 10/29/2021 in [place] Office Visit from 09/25/2021 in [place] Total Score 0 0 0      ECT-MADRS    [place] Admission (Discharged) from 06/19/2021 in [place] Most recent reading at 07/09/2021  1:24 PM ECT Treatment from 06/25/2021 in [place] Most recent reading at 06/25/2021  3:22 PM  MADRS Total Score 13 [place] Office Visit from 03/29/2021 in [facility]  Total GAD-7 Score 11      PHQ2-9    [place] Visit from 12/31/2021 in [facility] Office Visit from 11/21/2021 in [facility] Office Visit from 10/29/2021 in [place] Visit from 08/16/2021 in [place] Visit from 04/25/2021 in [facility]  PHQ-2 Total Score 4 0 5 5 0  PHQ-9 Total Score 4 -- 17 16 0      Flowsheet Row Video Visit from 01/16/2022 in [place] Admission (Discharged) from 01/14/2022 in [place] ED to Hosp-Admission (Discharged) from 12/12/2021 in [place] CATEGORY No Risk No Risk No Risk        Assessment and Plan: Amber [name] is a 76 year old Caucasian female who currently lives in [place], has a history of MDD, anxiety disorder,  completed ECT treatment for failure to thrive-February 2023, presented for medication management.  Patient with recent procedure for ureteral stone, stent exchange, continues to be in pain, as well as reports anxiety, will benefit from the following plan.  Plan MDD with psychosis-unstable Olanzapine 7.5 mg p.o. daily at bedtime Mirtazapine at reduced dose to 15 mg p.o. nightly Discontinue BuSpar for lack of benefit. Increase Prozac to 60 mg p.o. daily in the morning with breakfast Continue CBT with Ms. Amy Jenkins-therapist  GAD-unstable Increase Prozac to 60 mg p.o. daily Continue Xanax however currently advised to limit use. Continue CBT on a more frequent basis.   Collateral Information obtained from daughter as noted above.  I have reviewed very records from [place] as noted above.  Reviewed labs and discussed with patient-T4-elevated at 1.16-7/19/2023-PTH-continues to be elevated.  Discussed with patient as well as daughter the effect of thyroid abnormalities on her mood, sleep, appetite.  Patient to continue to follow-up with endocrinology as well as primary care provider for management of the same.    Collaboration of Care: Collaboration of Care: Referral or follow-up with counselor/therapist AEB encouraged to follow up with therapist and Other encouraged to follow up with endocrinology, primary care provider.  Patient/Guardian was advised Release of Information must be obtained prior to any record release in order to collaborate their care with an outside provider. Patient/Guardian was advised if they have not already done so to contact the registration department to sign all necessary forms in order for [place] to release information regarding their care.   Consent: Patient/Guardian gives verbal consent for treatment and assignment  of benefits for services provided during this visit. Patient/Guardian expressed understanding and agreed to proceed.   This note was generated in part  or whole with voice recognition software. Voice recognition is usually quite accurate but there are transcription errors that can and very often do occur. I apologize for any typographical errors that were not detected and corrected.      Ursula Alert, MD 01/17/2022, 9:19 AM

## 2022-01-22 ENCOUNTER — Ambulatory Visit (INDEPENDENT_AMBULATORY_CARE_PROVIDER_SITE_OTHER): Payer: Medicare Other | Admitting: Urology

## 2022-01-22 ENCOUNTER — Encounter: Payer: Self-pay | Admitting: Urology

## 2022-01-22 VITALS — BP 178/94 | HR 59 | Ht 66.5 in | Wt 126.0 lb

## 2022-01-22 DIAGNOSIS — N201 Calculus of ureter: Secondary | ICD-10-CM

## 2022-01-22 MED ORDER — CIPROFLOXACIN HCL 500 MG PO TABS
500.0000 mg | ORAL_TABLET | Freq: Once | ORAL | Status: AC
  Administered 2022-01-22: 500 mg via ORAL

## 2022-01-22 NOTE — Progress Notes (Signed)
   01/22/22  CC:  Chief Complaint  Patient presents with   Cysto Stent Removal    HPI: 76 year old female with a personal history of right proximal ureteral calculi x2 status post ureteroscopy who presents today for cystoscopy, stent removal.  She denies any issues today.  She is worried about having urinary incontinence on a ride home.  NED. A&Ox3.   No respiratory distress   Abd soft, NT, ND Normal external genitalia with patent urethral meatus  Cystoscopy/ Stent removal procedure  Patient identification was confirmed, informed consent was obtained, and patient was prepped using Betadine solution.  Lidocaine jelly was administered per urethral meatus.    Preoperative abx where received prior to procedure.    Procedure: - Flexible cystoscope introduced, without any difficulty.   - Thorough search of the bladder revealed:    normal urethral meatus  Stent seen emanating from right ureteral orifice, grasped with stent graspers, and removed in entirety.   .  Post-Procedure: - Patient tolerated the procedure well   Assessment/ Plan:  1. Right ureteral stone Status post stent removal today  Warning symptoms reviewed  Prophylactic Cipro given today  We will plan to return in 4 to 6 weeks with renal ultrasound prior.  She is accompanied by daughter who is agreeable this plan as well. - Urinalysis, Complete - ciprofloxacin (CIPRO) tablet 500 mg   Hollice Espy, MD

## 2022-01-23 LAB — MICROSCOPIC EXAMINATION: RBC, Urine: >30 /hpf — AB (ref 0–2)

## 2022-01-23 LAB — URINALYSIS, COMPLETE
Bilirubin, UA: NEGATIVE
Glucose, UA: NEGATIVE
Ketones, UA: NEGATIVE
Nitrite, UA: NEGATIVE
Specific Gravity, UA: 1.025 (ref 1.005–1.030)
Urobilinogen, Ur: 0.2 mg/dL (ref 0.2–1.0)
pH, UA: 6.5 (ref 5.0–7.5)

## 2022-02-06 ENCOUNTER — Ambulatory Visit (INDEPENDENT_AMBULATORY_CARE_PROVIDER_SITE_OTHER): Payer: Medicare Other | Admitting: Internal Medicine

## 2022-02-06 ENCOUNTER — Encounter (INDEPENDENT_AMBULATORY_CARE_PROVIDER_SITE_OTHER): Payer: Self-pay | Admitting: *Deleted

## 2022-02-06 ENCOUNTER — Encounter: Payer: Self-pay | Admitting: Internal Medicine

## 2022-02-06 DIAGNOSIS — R1310 Dysphagia, unspecified: Secondary | ICD-10-CM

## 2022-02-06 DIAGNOSIS — K219 Gastro-esophageal reflux disease without esophagitis: Secondary | ICD-10-CM | POA: Diagnosis not present

## 2022-02-06 DIAGNOSIS — R110 Nausea: Secondary | ICD-10-CM

## 2022-02-06 MED ORDER — PROMETHAZINE HCL 25 MG PO TABS: 25.0000 mg | ORAL_TABLET | Freq: Three times a day (TID) | ORAL | 0 refills | Status: DC | PRN

## 2022-02-06 MED ORDER — OMEPRAZOLE 40 MG PO CPDR: 40.0000 mg | DELAYED_RELEASE_CAPSULE | Freq: Every day | ORAL | 3 refills | Status: DC

## 2022-02-06 NOTE — Patient Instructions (Signed)
Please start taking Omeprazole for acid reflux/gastritis.  Please take Phenergan as needed for nausea.

## 2022-02-06 NOTE — Assessment & Plan Note (Signed)
Needs to chew food properly and take small bites Has improved lately Started Omeprazole for GERD

## 2022-02-06 NOTE — Progress Notes (Signed)
Virtual Visit via Telephone Note   This visit type was conducted due to national recommendations for restrictions regarding the COVID-19 Pandemic (e.g. social distancing) in an effort to limit this patient's exposure and mitigate transmission in our community.  Due to her co-morbid illnesses, this patient is at least at moderate risk for complications without adequate follow up.  This format is felt to be most appropriate for this patient at this time.  The patient did not have access to video technology/had technical difficulties with video requiring transitioning to audio format only (telephone).  All issues noted in this document were discussed and addressed.  No physical exam could be performed with this format.  Evaluation Performed:  Follow-up visit  Date:  02/06/2022   ID:  Amber [name] May 15, 1946, MRN [id number]  Patient Location: Home Provider Location: Office/Clinic  Participants: Patient and daughter Location of Patient: Home Location of Provider: Telehealth Consent was obtain for visit to be over via telehealth. I verified that I am speaking with the correct person using two identifiers.  PCP:  Lindell Spar, MD   Chief Complaint: Nausea  History of Present Illness:    Amber [name] is a 76 y.o. female who has a televisit for c/o nausea and epigastric pain/discomfort, which are chronic.  She has tried Zofran for nausea without much relief.  She was given Pepcid for GERD in the past, but has not been taking it recently.  She denies any recent episodes of vomiting.  Her appetite has slowly improved and has been eating better lately, reported by daughter.  The patient does not have symptoms concerning for COVID-19 infection (fever, chills, cough, or new shortness of breath).   Past Medical, Surgical, Social History, Allergies, and Medications have been Reviewed.  Past Medical History:  Diagnosis Date   ABLA (acute blood loss anemia) 06/19/2021   Anxiety     Aortic atherosclerosis (HCC)    Aspiration pneumonia of right lung due to gastric secretions (HCC)    C. difficile colitis 07/13/2021   COVID-19 virus infection 06/08/2021    Depression    DVT of leg (deep venous thrombosis) ([place]) 05/2021   Dysphagia    Failure to thrive in adult    GERD (gastroesophageal reflux disease)    History of kidney stones    right ureter   Hypertension    Hyperthyroidism    Kidney disease    Pulmonary embolism ([place]) 05/2021   Stroke ([place])    showed up in MRI   Thoracic aortic aneurysm [name])    Urinary incontinence    Past Surgical History:  Procedure Laterality Date   CYSTOSCOPY W/ URETERAL STENT PLACEMENT Right 12/12/2021   Procedure: CYSTOSCOPY WITH RETROGRADE PYELOGRAM/URETERAL STENT PLACEMENT;  Surgeon: Hollice Espy, MD;  Location: [facility] ORS;  Service: Urology;  Laterality: Right;   CYSTOSCOPY/URETEROSCOPY/HOLMIUM LASER/STENT PLACEMENT Right 01/14/2022   Procedure: CYSTOSCOPY/URETEROSCOPY/HOLMIUM LASER/STENT EXCHANGE;  Surgeon: Hollice Espy, MD;  Location: [facility] ORS;  Service: Urology;  Laterality: Right;     Current Meds  Medication Sig   acetaminophen (TYLENOL) 500 MG tablet Take 500 mg by mouth every 6 (six) hours as needed for moderate pain or mild pain.   ALPRAZolam (XANAX) 0.5 MG tablet Take 1 tablet (0.5 mg total) by mouth 2 (two) times daily as needed for anxiety. (Patient taking differently: Take 0.5 mg by mouth 2 (two) times daily as needed for anxiety. Currently limiting use)   docusate sodium (COLACE) 100 MG capsule Take 1 capsule  by mouth 2 (two) times daily.   ferrous sulfate 325 (65 FE) MG tablet Take 1 tablet (325 mg total) by mouth daily with breakfast.   FLUoxetine (PROZAC) 20 MG capsule Take 1 capsule (20 mg total) by mouth daily with breakfast. Take along with 40 mg to make it 60 mg daily   FLUoxetine (PROZAC) 40 MG capsule Take 1 capsule (40 mg total) by mouth daily with breakfast.   methimazole (TAPAZOLE) 5 MG tablet Take 1  tablet (5 mg total) by mouth daily.   metoprolol succinate (TOPROL-XL) 25 MG 24 hr tablet TAKE 1/2 TABLET BY MOUTH ONCE DAILY   mirtazapine (REMERON) 15 MG tablet Take 1 tablet (15 mg total) by mouth at bedtime.   Multiple Vitamin (MULTIVITAMIN WITH MINERALS) TABS tablet Take 1 tablet by mouth daily.   OLANZapine (ZYPREXA) 7.5 MG tablet Take 1 tablet (7.5 mg total) by mouth at bedtime.   omeprazole (PRILOSEC) 40 MG capsule Take 1 capsule (40 mg total) by mouth daily.   polyethylene glycol powder (GLYCOLAX/MIRALAX) 17 GM/SCOOP powder Take 17 g by mouth as needed for mild constipation.   promethazine (PHENERGAN) 25 MG tablet Take 1 tablet (25 mg total) by mouth every 8 (eight) hours as needed for nausea or vomiting.   sodium bicarbonate 650 MG tablet Take 1 tablet (650 mg total) by mouth daily.   Vitamin D, Ergocalciferol, (DRISDOL) 1.25 MG (50000 UNIT) CAPS capsule Take 1 capsule (50,000 Units total) by mouth every 7 (seven) days.     Allergies:   Fish allergy, Shellfish allergy, and Valium [diazepam]   ROS:   Please see the history of present illness.     All other systems reviewed and are negative.   Labs/Other Tests and Data Reviewed:    Recent Labs: 12/12/2021: Magnesium 1.9; TSH 0.060 12/13/2021: ALT 8 12/31/2021: BUN 14; Creatinine, Ser 1.15; Hemoglobin 9.6; Platelets 222; Potassium 4.1; Sodium 144   Recent Lipid Panel Lab Results  Component Value Date/Time   CHOL 162 10/19/2021 08:18 AM   TRIG 102 10/19/2021 08:18 AM   HDL 46 10/19/2021 08:18 AM   CHOLHDL 3.5 10/19/2021 08:18 AM   LDLCALC 97 10/19/2021 08:18 AM    Wt Readings from Last 3 Encounters:  01/22/22 126 lb (57.2 kg)  01/14/22 126 lb (57.2 kg)  01/07/22 126 lb (57.2 kg)     ASSESSMENT & PLAN:    Gastroesophageal reflux disease without esophagitis Started Omeprazole as needed for GERD Phenergan as needed for nausea Needs to eat at regular intervals Avoid hot and spicy food Referred to GI for persistent  nausea  Dysphagia Needs to chew food properly and take small bites Has improved lately Started Omeprazole for GERD    Time:   Today, I have spent 9 minutes reviewing the chart, including problem list, medications, and with the patient with telehealth technology discussing the above problems.   Medication Adjustments/Labs and Tests Ordered: Current medicines are reviewed at length with the patient today.  Concerns regarding medicines are outlined above.   Tests Ordered: Orders Placed This Encounter  Procedures   Ambulatory referral to Gastroenterology    Medication Changes: Meds ordered this encounter  Medications   omeprazole (PRILOSEC) 40 MG capsule    Sig: Take 1 capsule (40 mg total) by mouth daily.    Dispense:  30 capsule    Refill:  3   promethazine (PHENERGAN) 25 MG tablet    Sig: Take 1 tablet (25 mg total) by mouth every 8 (eight) hours as  needed for nausea or vomiting.    Dispense:  20 tablet    Refill:  0     Note: This dictation was prepared with Dragon dictation along with smaller phrase technology. Similar sounding words can be transcribed inadequately or may not be corrected upon review. Any transcriptional errors that result from this process are unintentional.      Disposition:  Follow up  Signed, Lindell Spar, MD  02/06/2022 3:49 PM     [place]

## 2022-02-06 NOTE — Assessment & Plan Note (Addendum)
Started Omeprazole as needed for GERD Phenergan as needed for nausea Needs to eat at regular intervals Avoid hot and spicy food Referred to GI for persistent nausea

## 2022-02-07 LAB — TSH: TSH: 1.67 u[IU]/mL (ref 0.450–4.500)

## 2022-02-07 LAB — T3, FREE: T3, Free: 2 pg/mL (ref 2.0–4.4)

## 2022-02-07 LAB — T4, FREE: Free T4: 0.99 ng/dL (ref 0.82–1.77)

## 2022-02-11 ENCOUNTER — Encounter: Payer: Self-pay | Admitting: "Endocrinology

## 2022-02-11 ENCOUNTER — Ambulatory Visit (INDEPENDENT_AMBULATORY_CARE_PROVIDER_SITE_OTHER): Payer: Medicare Other | Admitting: "Endocrinology

## 2022-02-11 VITALS — BP 136/68 | HR 56 | Ht 66.5 in | Wt 129.0 lb

## 2022-02-11 DIAGNOSIS — E0590 Thyrotoxicosis, unspecified without thyrotoxic crisis or storm: Secondary | ICD-10-CM | POA: Diagnosis not present

## 2022-02-11 MED ORDER — METHIMAZOLE 5 MG PO TABS: 2.5000 mg | ORAL_TABLET | Freq: Every day | ORAL | 1 refills | Status: DC

## 2022-02-11 NOTE — Progress Notes (Signed)
02/11/2022    Endocrinology follow-up note   Subjective:    Patient ID: Amber [name], female    DOB: 1946/02/17, PCP Lindell Spar, MD.   Past Medical History:  Diagnosis Date   ABLA (acute blood loss anemia) 06/19/2021   Anxiety    Aortic atherosclerosis (HCC)    Aspiration pneumonia of right lung due to gastric secretions ([place])    C. difficile colitis 07/13/2021   COVID-19 virus infection 06/08/2021    Depression    DVT of leg (deep venous thrombosis) ([place]) 05/2021   Dysphagia    Failure to thrive in adult    GERD (gastroesophageal reflux disease)    History of kidney stones    right ureter   Hypertension    Hyperthyroidism    Kidney disease    Pulmonary embolism ([place]) 05/2021   Stroke ([place])    showed up in MRI   Thoracic aortic aneurysm [facility])    Urinary incontinence     Past Surgical History:  Procedure Laterality Date   CYSTOSCOPY W/ URETERAL STENT PLACEMENT Right 12/12/2021   Procedure: CYSTOSCOPY WITH RETROGRADE PYELOGRAM/URETERAL STENT PLACEMENT;  Surgeon: Hollice Espy, MD;  Location: [facility] ORS;  Service: Urology;  Laterality: Right;   CYSTOSCOPY/URETEROSCOPY/HOLMIUM LASER/STENT PLACEMENT Right 01/14/2022   Procedure: CYSTOSCOPY/URETEROSCOPY/HOLMIUM LASER/STENT EXCHANGE;  Surgeon: Hollice Espy, MD;  Location: [facility] ORS;  Service: Urology;  Laterality: Right;    Social History   Socioeconomic History   Marital status: Married    Spouse name: Jeneen Rinks   Number of children: 5   Years of education: Not on file   Highest education level: Not on file  Occupational History   Not on file  Tobacco Use   Smoking status: Former    Types: Cigarettes    Quit date: 05/27/2018    Years since quitting: 3.7   Smokeless tobacco: Never  Vaping Use   Vaping Use: Never used  Substance and Sexual Activity   Alcohol use: Not Currently   Drug use: Not Currently   Sexual activity: Not Currently  Other Topics Concern   Not on file  Social History Narrative    Right handed    one story home    lives with family    Doesn't work currently   Social Determinants of Radio broadcast assistant Strain: Not on file  Food Insecurity: Not on file  Transportation Needs: Not on file  Physical Activity: Not on file  Stress: Not on file  Social Connections: Not on file    Family History  Problem Relation Age of Onset   High blood pressure Maternal Grandmother     Outpatient Encounter Medications as of 02/11/2022  Medication Sig   acetaminophen (TYLENOL) 500 MG tablet Take 500 mg by mouth every 6 (six) hours as needed for moderate pain or mild pain.   ALPRAZolam (XANAX) 0.5 MG tablet Take 1 tablet (0.5 mg total) by mouth 2 (two) times daily as needed for anxiety. (Patient taking differently: Take 0.5 mg by mouth 2 (two) times daily as needed for anxiety. Currently limiting use)   docusate sodium (COLACE) 100 MG capsule Take 1 capsule by mouth 2 (two) times daily.   ferrous sulfate 325 (65 FE) MG tablet Take 1 tablet (325 mg total) by mouth daily with breakfast.   FLUoxetine (PROZAC) 20 MG capsule Take 1 capsule (20 mg total) by mouth daily with breakfast. Take along with 40 mg to make it 60 mg daily   FLUoxetine (PROZAC)  40 MG capsule Take 1 capsule (40 mg total) by mouth daily with breakfast.   methimazole (TAPAZOLE) 5 MG tablet Take 0.5 tablets (2.5 mg total) by mouth daily.   metoprolol succinate (TOPROL-XL) 25 MG 24 hr tablet TAKE 1/2 TABLET BY MOUTH ONCE DAILY   mirtazapine (REMERON) 15 MG tablet Take 1 tablet (15 mg total) by mouth at bedtime.   Multiple Vitamin (MULTIVITAMIN WITH MINERALS) TABS tablet Take 1 tablet by mouth daily.   OLANZapine (ZYPREXA) 7.5 MG tablet Take 1 tablet (7.5 mg total) by mouth at bedtime.   omeprazole (PRILOSEC) 40 MG capsule Take 1 capsule (40 mg total) by mouth daily.   polyethylene glycol powder (GLYCOLAX/MIRALAX) 17 GM/SCOOP powder Take 17 g by mouth as needed for mild constipation.   promethazine (PHENERGAN) 25  MG tablet Take 1 tablet (25 mg total) by mouth every 8 (eight) hours as needed for nausea or vomiting.   sodium bicarbonate 650 MG tablet Take 1 tablet (650 mg total) by mouth daily.   Vitamin D, Ergocalciferol, (DRISDOL) 1.25 MG (50000 UNIT) CAPS capsule Take 1 capsule (50,000 Units total) by mouth every 7 (seven) days.   [DISCONTINUED] methimazole (TAPAZOLE) 5 MG tablet Take 1 tablet (5 mg total) by mouth daily.   No facility-administered encounter medications on file as of 02/11/2022.    ALLERGIES: Allergies  Allergen Reactions   Fish Allergy     Does not eat fish   Shellfish Allergy     "I pass out"   Valium [Diazepam] Other (See Comments)    Makes her want to " fly"    VACCINATION STATUS:  There is no immunization history on file for this patient.   HPI  Amber [name] is 76 y.o. female who presents today with a medical history as above. she is being seen in follow-up after she was seen in consultation for hyperthyroidism requested by Lindell Spar, MD.   Her work-up is complete .  After thyroid uptake and scan showed cold nodule in the left lobe of her thyroid, she was considered for dedicated thyroid ultrasound which confirms 2.8 cm suspicious nodule corresponding to the area of cold nodule on the uptake and scan. She underwent  FNA biopsy of this nodule with inconclusive cytology which needed molecular study. Her afirma results are benign. she has been dealing with symptoms of anxiety, weight loss, sleep disturbance for several weeks. She was started on low dose methimazole 5 mg po qday. She returns with improved thyroid function tests. she denies dysphagia, choking, shortness of breath, no recent voice change.   She c/o nausea , scheduled to see GI. she denies family history of thyroid dysfunction nor thyroid malignancy. she denies personal history of goiter. she is not on any anti-thyroid medications nor on any thyroid hormone supplements. she  is willing to proceed with  appropriate work up and therapy for thyrotoxicosis. She is accompanied by her daughter.                           Review of systems  Constitutional: +  steady weight  + fatigue, + subjective hyperthermia Eyes: no blurry vision, +xerophthalmia ENT: no sore throat, no nodules palpated in throat, no dysphagia/odynophagia, nor hoarseness    Objective:    BP 136/68   Pulse (!) 56   Ht 5' 6.5" (1.689 m)   Wt 129 lb (58.5 kg)   BMI 20.51 kg/m   Wt Readings from Last 3 Encounters:  02/11/22 129 lb (58.5 kg)  01/22/22 126 lb (57.2 kg)  01/14/22 126 lb (57.2 kg)                                                Physical exam  Constitutional: Body mass index is 20.51 kg/m., not in acute distress, + anxious state of mind Eyes: PERRLA, EOMI,  exophthalmos    CMP     Component Value Date/Time   NA 144 12/31/2021 1113   K 4.1 12/31/2021 1113   CL 108 (H) 12/31/2021 1113   CO2 23 12/31/2021 1113   GLUCOSE 91 12/31/2021 1113   GLUCOSE 83 12/17/2021 0555   BUN 14 12/31/2021 1113   CREATININE 1.15 (H) 12/31/2021 1113   CREATININE 1.25 (H) 10/23/2021 1411   CALCIUM 10.1 12/31/2021 1113   PROT 5.6 (L) 12/13/2021 0512   PROT 6.5 05/04/2021 1531   ALBUMIN 2.3 (L) 12/13/2021 0512   ALBUMIN 3.4 (L) 05/04/2021 1531   AST 6 (L) 12/13/2021 0512   AST 9 (L) 10/23/2021 1411   ALT 8 12/13/2021 0512   ALT 6 10/23/2021 1411   ALKPHOS 46 12/13/2021 0512   BILITOT 0.4 12/13/2021 0512   BILITOT 0.4 10/23/2021 1411   GFRNONAA >60 12/17/2021 0555   GFRNONAA 46 (L) 10/23/2021 1411     CBC    Component Value Date/Time   WBC 4.8 12/31/2021 1113   WBC 4.9 12/17/2021 0555   RBC 3.49 (L) 12/31/2021 1113   RBC 3.10 (L) 12/17/2021 0555   HGB 9.6 (L) 12/31/2021 1113   HCT 29.4 (L) 12/31/2021 1113   PLT 222 12/31/2021 1113   MCV 84 12/31/2021 1113   MCH 27.5 12/31/2021 1113   MCH 26.5 12/17/2021 0555   MCHC 32.7 12/31/2021 1113   MCHC 31.2 12/17/2021 0555   RDW 15.5 (H) 12/31/2021 1113    LYMPHSABS 1.6 12/31/2021 1113   MONOABS 0.5 12/12/2021 1515   EOSABS 0.1 12/31/2021 1113   BASOSABS 0.0 12/31/2021 1113     Lab Results  Component Value Date   TSH 1.670 02/06/2022   TSH 0.060 (L) 12/12/2021   TSH 0.022 (L) 08/30/2021   TSH <0.080 (L) 08/10/2021   TSH 0.545 06/09/2021   TSH 0.559 06/08/2021   TSH 1.560 05/04/2021   TSH 0.152 (L) 03/29/2021   FREET4 0.99 02/06/2022   FREET4 1.16 (H) 12/12/2021   FREET4 1.43 08/30/2021     Thyroid uptake and scan on 09/11/21 4 hour I-123 uptake = 2.9% (normal 5-20%),  24 hour I-123 uptake = 11.9% (normal 10-30%)   IMPRESSION: 1. Decreased iodine uptake at 4 hours, with low normal uptake at 24 hours. 2. Diffuse heterogeneity of the thyroid parenchyma, with equivocal findings for cold nodule corresponding to the mid left lobe thyroid lesion seen on prior CT. Correlation with ultrasound is recommended to help guide possible sampling.   FNA- AUS Afirma - Benign  Assessment & Plan:   1. Hyperthyroidism 2.  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  I reviewed her labs and thyroid scan with her and her daughter.  I also discussed her most recent thyroid imaging-ultrasound confirming multinodular goiter.  The ultrasound confirmed 2.8 cm nodule on the left lobe corresponding to the cold nodule identified on thyroid uptake and scan.  This is also corresponding to the lesion  seen on CT scanning. She needed fine-needle aspiration of the suspicious nodule to rule out malignancy.  Afirma study showed benign finding. Due to low  uptake , she did not need ablative therapy. She was started on methimazole to which she has responded with presentation c/w treatment effect. I discussed and lowered her  methimazole to 2.5 mg po daily with breakfast with plan to repeat TFTs and office visit in 4 months Side effects and precausions discussed with the family.   She is already on Metoprolol, no  need for additional Beta blocker.  She is advised to wear a compression LE sleeve to help with peripheral dependent edema. -Patient is advised to maintain close follow up with Lindell Spar, MD for primary care needs.   I spent 21 minutes in the care of the patient today including review of labs from Thyroid Function, CMP, and other relevant labs ; imaging/biopsy records (current and previous including abstractions from other facilities); face-to-face time discussing  her lab results and symptoms, medications doses, her options of short and long term treatment based on the latest standards of care / guidelines;   and documenting the encounter.  Amber [name]  participated in the discussions, expressed understanding, and voiced agreement with the above plans.  All questions were answered to her satisfaction. she is encouraged to contact clinic should she have any questions or concerns prior to her return visit.   Follow up plan: Return in about 4 months (around 06/13/2022) for F/U with Pre-visit Labs.   Thank you for involving me in the care of this pleasant patient, and I will continue to update you with her progress.  Glade Lloyd, MD [facility] Phone: [phone]  Fax: [phone]   02/11/2022, 5:27 PM  This note was partially dictated with voice recognition software. Similar sounding words can be transcribed inadequately or may not  be corrected upon review.

## 2022-02-18 ENCOUNTER — Encounter: Payer: Self-pay | Admitting: Internal Medicine

## 2022-02-19 ENCOUNTER — Other Ambulatory Visit: Payer: Self-pay | Admitting: *Deleted

## 2022-02-19 DIAGNOSIS — R000 Tachycardia, unspecified: Secondary | ICD-10-CM

## 2022-02-19 MED ORDER — METOPROLOL SUCCINATE ER 25 MG PO TB24: 12.5000 mg | ORAL_TABLET | Freq: Every day | ORAL | 0 refills | Status: DC

## 2022-02-19 MED ORDER — VITAMIN D (ERGOCALCIFEROL) 1.25 MG (50000 UNIT) PO CAPS: 50000.0000 [IU] | ORAL_CAPSULE | ORAL | 0 refills | Status: DC

## 2022-02-20 ENCOUNTER — Ambulatory Visit (HOSPITAL_COMMUNITY)
Admission: RE | Admit: 2022-02-20 | Discharge: 2022-02-20 | Disposition: A | Discharge status: Home / Self Care | Payer: Medicare Other | Source: Ambulatory Visit | Attending: Urology | Admitting: Urology

## 2022-02-20 DIAGNOSIS — N201 Calculus of ureter: Secondary | ICD-10-CM | POA: Insufficient documentation

## 2022-03-05 ENCOUNTER — Ambulatory Visit (INDEPENDENT_AMBULATORY_CARE_PROVIDER_SITE_OTHER): Payer: Medicare Other | Admitting: Urology

## 2022-03-05 ENCOUNTER — Encounter: Payer: Self-pay | Admitting: Urology

## 2022-03-05 VITALS — BP 157/92 | HR 61 | Ht 66.5 in | Wt 127.0 lb

## 2022-03-05 DIAGNOSIS — N1330 Unspecified hydronephrosis: Secondary | ICD-10-CM | POA: Diagnosis not present

## 2022-03-05 DIAGNOSIS — N3941 Urge incontinence: Secondary | ICD-10-CM | POA: Diagnosis not present

## 2022-03-05 NOTE — Progress Notes (Signed)
03/05/2022 6:40 PM   Amber [name] 1945/06/28 [id number]  Referring provider: Lindell Spar, MD [address] [place],  [place] [address]  Chief Complaint  Patient presents with   Follow-up   Urinary Incontinence    HPI: 76 year old female who returns today with renal ultrasound following ureteroscopy.  She initially presented to [facility] with an obstructing right proximal ureteral calculus in the setting of infection.  She underwent emergent right ureteral stent placement which was somewhat complicated.  She returned to the operating room for staged ureteroscopy which was essentially uncomplicated.  Stent was removed a week later.  She follows up today with a renal ultrasound which shows mild hydronephrosis with marked improvement from preop.  She is no longer having flank pain or any other kidney issues.  Her creatinine is normalized to 1.15 most recently.  She mentions today that she struggling with chronic chronic nausea.  She has an evaluation appointment with gastroenterology in December.  She also mentions today that over the past several years, she had increasing urinary frequency and urgency.  She sometimes cannot get to the bathroom on time and has episodes of urge incontinence.  She is to wear smaller pads but now has to wear depends.  They wonder if there is any options for this.  Notably at the time of cystoscopy ureteroscopy she had no significant bladder pathology.   PMH: Past Medical History:  Diagnosis Date   ABLA (acute blood loss anemia) 06/19/2021   Anxiety    Aortic atherosclerosis (HCC)    Aspiration pneumonia of right lung due to gastric secretions (HCC)    C. difficile colitis 07/13/2021   COVID-19 virus infection 06/08/2021    Depression    DVT of leg (deep venous thrombosis) ([place]) 05/2021   Dysphagia    Failure to thrive in adult    GERD (gastroesophageal reflux disease)    History of kidney stones    right ureter   Hypertension     Hyperthyroidism    Kidney disease    Pulmonary embolism ([place]) 05/2021   Stroke ([place])    showed up in MRI   Thoracic aortic aneurysm [facility])    Urinary incontinence     Surgical History: Past Surgical History:  Procedure Laterality Date   CYSTOSCOPY W/ URETERAL STENT PLACEMENT Right 12/12/2021   Procedure: CYSTOSCOPY WITH RETROGRADE PYELOGRAM/URETERAL STENT PLACEMENT;  Surgeon: Hollice Espy, MD;  Location: [facility] ORS;  Service: Urology;  Laterality: Right;   CYSTOSCOPY/URETEROSCOPY/HOLMIUM LASER/STENT PLACEMENT Right 01/14/2022   Procedure: CYSTOSCOPY/URETEROSCOPY/HOLMIUM LASER/STENT EXCHANGE;  Surgeon: Hollice Espy, MD;  Location: [facility] ORS;  Service: Urology;  Laterality: Right;    Home Medications:  Allergies as of 03/05/2022       Reactions   Fish Allergy    Does not eat fish   Shellfish Allergy    "I pass out"   Valium [diazepam] Other (See Comments)   Makes her want to " fly"        Medication List        Accurate as of March 05, 2022  6:40 PM. If you have any questions, ask your nurse or doctor.          STOP taking these medications    ALPRAZolam 0.5 MG tablet Commonly known as: XANAX Stopped by: Hollice Espy, MD       TAKE these medications    acetaminophen 500 MG tablet Commonly known as: TYLENOL Take 500 mg by mouth every 6 (six) hours as needed for moderate  pain or mild pain.   docusate sodium 100 MG capsule Commonly known as: COLACE Take 1 capsule by mouth 2 (two) times daily.   ferrous sulfate 325 (65 FE) MG tablet Take 1 tablet (325 mg total) by mouth daily with breakfast.   FLUoxetine 20 MG capsule Commonly known as: PROzac Take 1 capsule (20 mg total) by mouth daily with breakfast. Take along with 40 mg to make it 60 mg daily What changed: Another medication with the same name was removed. Continue taking this medication, and follow the directions you see here. Changed by: Hollice Espy, MD   methimazole 5 MG tablet Commonly  known as: TAPAZOLE Take 0.5 tablets (2.5 mg total) by mouth daily.   metoprolol succinate 25 MG 24 hr tablet Commonly known as: TOPROL-XL Take 0.5 tablets (12.5 mg total) by mouth daily.   mirtazapine 15 MG tablet Commonly known as: REMERON Take 1 tablet (15 mg total) by mouth at bedtime.   multivitamin with minerals Tabs tablet Take 1 tablet by mouth daily.   OLANZapine 7.5 MG tablet Commonly known as: ZYPREXA Take 1 tablet (7.5 mg total) by mouth at bedtime.   omeprazole 40 MG capsule Commonly known as: PRILOSEC Take 1 capsule (40 mg total) by mouth daily.   polyethylene glycol powder 17 GM/SCOOP powder Commonly known as: GLYCOLAX/MIRALAX Take 17 g by mouth as needed for mild constipation.   promethazine 25 MG tablet Commonly known as: PHENERGAN Take 1 tablet (25 mg total) by mouth every 8 (eight) hours as needed for nausea or vomiting.   sodium bicarbonate 650 MG tablet Take 1 tablet (650 mg total) by mouth daily.   Vitamin D (Ergocalciferol) 1.25 MG (50000 UNIT) Caps capsule Commonly known as: DRISDOL Take 1 capsule (50,000 Units total) by mouth every 7 (seven) days.        Allergies:  Allergies  Allergen Reactions   Fish Allergy     Does not eat fish   Shellfish Allergy     "I pass out"   Valium [Diazepam] Other (See Comments)    Makes her want to " fly"    Family History: Family History  Problem Relation Age of Onset   High blood pressure Maternal Grandmother     Social History:  reports that she quit smoking about 3 years ago. Her smoking use included cigarettes. She has never used smokeless tobacco. She reports that she does not currently use alcohol. She reports that she does not currently use drugs.   Physical Exam: BP (!) 157/92   Pulse 61   Ht 5' 6.5" (1.689 m)   Wt 127 lb (57.6 kg)   BMI 20.19 kg/m   Constitutional:  Alert and oriented, No acute distress.  Accompanied by her daughter today.  Ambulating with walker HEENT: [place] AT, moist  mucus membranes.  Trachea midline, no masses. Skin: No rashes, bruises or suspicious lesions. Neurologic: Grossly intact, no focal deficits, moving all 4 extremities. Psychiatric: Normal mood and affect.  Laboratory Data: Lab Results  Component Value Date   WBC 4.8 12/31/2021   HGB 9.6 (L) 12/31/2021   HCT 29.4 (L) 12/31/2021   MCV 84 12/31/2021   PLT 222 12/31/2021    Lab Results  Component Value Date   CREATININE 1.15 (H) 12/31/2021     Lab Results  Component Value Date   HGBA1C 4.9 10/19/2021       Pertinent Imaging: US RENAL  Narrative CLINICAL DATA:  Right ureteral stone follow-up.  EXAM: RENAL / URINARY  TRACT ULTRASOUND COMPLETE  COMPARISON:  CT of the abdomen and pelvis December 12, 2021.  FINDINGS: Right Kidney:  Renal measurements: 10.1 x 5.0 x 6.1 cm = volume: 161 mL. Mild hydronephrosis, significantly improved compared to the previous CT scan.  Left Kidney:  Renal measurements: 9.8 x 4.7 x 4.0 cm = volume: 95 mL. Contains a 3.6 mm stone. Contains a 1.8 cm cyst. No follow-up imaging recommended for the cyst.  Bladder:  Appears normal for degree of bladder distention.  Other:  None.  IMPRESSION: 1. Mild hydronephrosis on the right is significantly improved compared to the December 12, 2021 CT scan. 2. 3.6 mm stone in the left kidney without obstruction. 3. No other significant abnormalities.   Electronically Signed By: Dorise Bullion III M.D. On: 02/20/2022 18:21  I personally reviewed the above renal ultrasound images as well as compared to her previous preoperative CT.  Marked improvement in hydronephrosis.  Assessment & Plan:    1. 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 Urge  incontinence We discussed options for worsening urge incontinence.  Cystoscopy in the operating room was unremarkable.  We discussed options including anticholinergics and beta 3 agonist.  Her daughter is a Software engineer and agrees that anticholinergics are contraindicated based on her occasional cognitive issues and concern for confusion.  Ultimately after she will lengthy discussion, she elected not to pursue pharmacotherapy but will let us know in the future if she like to try Myrbetriq or Gemtesa.   There are no diagnoses linked to this encounter.  Return for RUS 3 months we will call with results, Follow up in office in 1 year with KUB.  Hollice Espy, MD  [facility] [address], [place], [place] [address] [phone]  I spent 31 total minutes on the day of the encounter including pre-visit review of the medical record, face-to-face time with the patient, and post visit ordering of labs/imaging/tests.

## 2022-03-13 ENCOUNTER — Ambulatory Visit: Payer: Medicare Other | Admitting: Psychiatry

## 2022-03-14 ENCOUNTER — Other Ambulatory Visit: Payer: Self-pay | Admitting: Internal Medicine

## 2022-03-14 DIAGNOSIS — N1831 Chronic kidney disease, stage 3a: Secondary | ICD-10-CM

## 2022-03-20 LAB — HEPATITIS C ANTIBODY: Hep C Virus Ab: NONREACTIVE

## 2022-03-21 LAB — CBC WITH DIFFERENTIAL/PLATELET
Basophils Absolute: 0 10*3/uL (ref 0.0–0.2)
Basos: 1 %
EOS (ABSOLUTE): 0.1 10*3/uL (ref 0.0–0.4)
Eos: 1 %
Hematocrit: 34.8 % (ref 34.0–46.6)
Hemoglobin: 11.4 g/dL (ref 11.1–15.9)
Immature Grans (Abs): 0 10*3/uL (ref 0.0–0.1)
Immature Granulocytes: 1 %
Lymphocytes Absolute: 2.1 10*3/uL (ref 0.7–3.1)
Lymphs: 33 %
MCH: 29 pg (ref 26.6–33.0)
MCHC: 32.8 g/dL (ref 31.5–35.7)
MCV: 89 fL (ref 79–97)
Monocytes Absolute: 0.4 10*3/uL (ref 0.1–0.9)
Monocytes: 6 %
Neutrophils Absolute: 3.8 10*3/uL (ref 1.4–7.0)
Neutrophils: 58 %
Platelets: 200 10*3/uL (ref 150–450)
RBC: 3.93 x10E6/uL (ref 3.77–5.28)
RDW: 14.2 % (ref 11.7–15.4)
WBC: 6.4 10*3/uL (ref 3.4–10.8)

## 2022-03-21 LAB — BASIC METABOLIC PANEL
BUN/Creatinine Ratio: 14 (ref 12–28)
BUN: 15 mg/dL (ref 8–27)
CO2: 24 mmol/L (ref 20–29)
Calcium: 10.3 mg/dL (ref 8.7–10.3)
Chloride: 107 mmol/L — ABNORMAL HIGH (ref 96–106)
Creatinine, Ser: 1.04 mg/dL — ABNORMAL HIGH (ref 0.57–1.00)
Glucose: 76 mg/dL (ref 70–99)
Potassium: 4 mmol/L (ref 3.5–5.2)
Sodium: 143 mmol/L (ref 134–144)
eGFR: 56 mL/min/{1.73_m2} — ABNORMAL LOW (ref >59)

## 2022-03-21 LAB — PARATHYROID HORMONE, INTACT (NO CA): PTH: 83 pg/mL — ABNORMAL HIGH (ref 15–65)

## 2022-03-25 ENCOUNTER — Encounter: Payer: Self-pay | Admitting: Internal Medicine

## 2022-03-25 ENCOUNTER — Ambulatory Visit (INDEPENDENT_AMBULATORY_CARE_PROVIDER_SITE_OTHER): Payer: Medicare Other | Admitting: Internal Medicine

## 2022-03-25 VITALS — BP 122/84 | HR 52 | Resp 18 | Ht 66.5 in | Wt 131.6 lb

## 2022-03-25 DIAGNOSIS — E0590 Thyrotoxicosis, unspecified without thyrotoxic crisis or storm: Secondary | ICD-10-CM

## 2022-03-25 DIAGNOSIS — K5904 Chronic idiopathic constipation: Secondary | ICD-10-CM

## 2022-03-25 DIAGNOSIS — Z2821 Immunization not carried out because of patient refusal: Secondary | ICD-10-CM | POA: Diagnosis not present

## 2022-03-25 DIAGNOSIS — N1831 Chronic kidney disease, stage 3a: Secondary | ICD-10-CM | POA: Diagnosis not present

## 2022-03-25 DIAGNOSIS — K219 Gastro-esophageal reflux disease without esophagitis: Secondary | ICD-10-CM

## 2022-03-25 DIAGNOSIS — F323 Major depressive disorder, single episode, severe with psychotic features: Secondary | ICD-10-CM

## 2022-03-25 DIAGNOSIS — R110 Nausea: Secondary | ICD-10-CM

## 2022-03-25 DIAGNOSIS — R000 Tachycardia, unspecified: Secondary | ICD-10-CM

## 2022-03-25 DIAGNOSIS — E559 Vitamin D deficiency, unspecified: Secondary | ICD-10-CM

## 2022-03-25 MED ORDER — METOCLOPRAMIDE HCL 5 MG PO TABS: 5.0000 mg | ORAL_TABLET | Freq: Three times a day (TID) | ORAL | 1 refills | Status: DC | PRN

## 2022-03-25 MED ORDER — VITAMIN D (ERGOCALCIFEROL) 1.25 MG (50000 UNIT) PO CAPS: 50000.0000 [IU] | ORAL_CAPSULE | ORAL | 0 refills | Status: DC

## 2022-03-25 NOTE — Assessment & Plan Note (Signed)
On Omeprazole as needed for GERD Reglan as needed for nausea, may help with delayed emptying Needs to eat at regular intervals Avoid hot and spicy food Referred to GI for persistent nausea

## 2022-03-25 NOTE — Progress Notes (Signed)
Established Patient Office Visit  Subjective:  Patient ID: Amber [name], female    DOB: 05-30-1945  Age: 76 y.o. MRN: [id number]  CC:  Chief Complaint  Patient presents with   Follow-up    4 month follow up pt still having nausea and not regular bowel movements sees gastro 12/4 wants to know if maybe she can have reglan for nervous stomach     HPI Amber [name] is a 76 y.o. female with past medical history of aortic atherosclerosis, depression with anxiety, PE, hyperthyroidism and protein-calorie malnutrition who presents for f/u of her chronic medical conditions. Her daughter is present during the visit today.  She still complains of persistent nausea, but denies any recent episode of vomiting.  She has tried taking Phenergan as needed for it.  She has started taking omeprazole for GERD, but did not improve her nausea.  Her appetite has improved now and she has gained about 7 lbs since the last visit.  She denies any diarrhea, melena or hematochezia currently.  Her Hb has also improved now.  She still reports constipation and pelvic pressure, but she has not been taking MiraLAX regularly.  Hypothyroidism: On methimazole, followed by Dr. Dorris Fetch.  CKD and metabolic acidosis: Her BMP shows improvement in GFR now.  Her bicarb level has also improved now.  She has urinary urgency, but denies any dysuria, hematuria or urinary hesitancy or resistance.  Depression with anxiety: Followed by psychiatry.  She has been placed on Lexapro recently.  She is on Remeron and olanzapine as well.  She is still complains of anxiety/agitation and depressed mood.  Denies any SI or HI currently.   Past Medical History:  Diagnosis Date   ABLA (acute blood loss anemia) 06/19/2021   Anxiety    Aortic atherosclerosis (HCC)    Aspiration pneumonia of right lung due to gastric secretions (HCC)    C. difficile colitis 07/13/2021   COVID-19 virus infection 06/08/2021    Depression    DVT of leg (deep venous  thrombosis) ([place]) 05/2021   Dysphagia    Failure to thrive in adult    GERD (gastroesophageal reflux disease)    History of kidney stones    right ureter   Hypertension    Hyperthyroidism    Kidney disease    Pulmonary embolism ([place]) 05/2021   Stroke ([place])    showed up in MRI   Thoracic aortic aneurysm [facility])    Urinary incontinence     Past Surgical History:  Procedure Laterality Date   CYSTOSCOPY W/ URETERAL STENT PLACEMENT Right 12/12/2021   Procedure: CYSTOSCOPY WITH RETROGRADE PYELOGRAM/URETERAL STENT PLACEMENT;  Surgeon: Hollice Espy, MD;  Location: [facility] ORS;  Service: Urology;  Laterality: Right;   CYSTOSCOPY/URETEROSCOPY/HOLMIUM LASER/STENT PLACEMENT Right 01/14/2022   Procedure: CYSTOSCOPY/URETEROSCOPY/HOLMIUM LASER/STENT EXCHANGE;  Surgeon: Hollice Espy, MD;  Location: [facility] ORS;  Service: Urology;  Laterality: Right;    Family History  Problem Relation Age of Onset   High blood pressure Maternal Grandmother     Social History   Socioeconomic History   Marital status: Married    Spouse name: Jeneen Rinks   Number of children: 5   Years of education: Not on file   Highest education level: Not on file  Occupational History   Not on file  Tobacco Use   Smoking status: Former    Types: Cigarettes    Quit date: 05/27/2018    Years since quitting: 3.8   Smokeless tobacco: Never  Vaping Use   Vaping  Use: Never used  Substance and Sexual Activity   Alcohol use: Not Currently   Drug use: Not Currently   Sexual activity: Not Currently  Other Topics Concern   Not on file  Social History Narrative   Right handed    one story home    lives with family    Doesn't work currently   Social Determinants of Radio broadcast assistant Strain: Not on file  Food Insecurity: Not on file  Transportation Needs: Not on file  Physical Activity: Not on file  Stress: Not on file  Social Connections: Not on file  Intimate Partner Violence: Not on file    Outpatient  Medications Prior to Visit  Medication Sig Dispense Refill   acetaminophen (TYLENOL) 500 MG tablet Take 500 mg by mouth every 6 (six) hours as needed for moderate pain or mild pain.     docusate sodium (COLACE) 100 MG capsule Take 1 capsule by mouth 2 (two) times daily.     escitalopram (LEXAPRO) 10 MG tablet Take 10 mg by mouth daily.     ferrous sulfate 325 (65 FE) MG tablet Take 1 tablet (325 mg total) by mouth daily with breakfast. 30 tablet 0   methimazole (TAPAZOLE) 5 MG tablet Take 0.5 tablets (2.5 mg total) by mouth daily. 45 tablet 1   mirtazapine (REMERON) 15 MG tablet Take 1 tablet (15 mg total) by mouth at bedtime. 90 tablet 0   Multiple Vitamin (MULTIVITAMIN WITH MINERALS) TABS tablet Take 1 tablet by mouth daily. 30 tablet 0   OLANZapine (ZYPREXA) 7.5 MG tablet Take 1 tablet (7.5 mg total) by mouth at bedtime. 90 tablet 0   omeprazole (PRILOSEC) 40 MG capsule Take 1 capsule (40 mg total) by mouth daily. 30 capsule 3   polyethylene glycol powder (GLYCOLAX/MIRALAX) 17 GM/SCOOP powder Take 17 g by mouth as needed for mild constipation.     citalopram (CELEXA) 10 MG tablet Take 10 mg by mouth daily.     metoprolol succinate (TOPROL-XL) 25 MG 24 hr tablet Take 0.5 tablets (12.5 mg total) by mouth daily. 45 tablet 0   promethazine (PHENERGAN) 25 MG tablet Take 1 tablet (25 mg total) by mouth every 8 (eight) hours as needed for nausea or vomiting. 20 tablet 0   sodium bicarbonate 650 MG tablet TAKE 1 TABLET BY MOUTH ONCE DAILY. 30 tablet 0   Vitamin D, Ergocalciferol, (DRISDOL) 1.25 MG (50000 UNIT) CAPS capsule Take 1 capsule (50,000 Units total) by mouth every 7 (seven) days. 5 capsule 0   FLUoxetine (PROZAC) 20 MG capsule Take 1 capsule (20 mg total) by mouth daily with breakfast. Take along with 40 mg to make it 60 mg daily 90 capsule 0   No facility-administered medications prior to visit.    Allergies  Allergen Reactions   Fish Allergy     Does not eat fish   Shellfish Allergy      "I pass out"   Valium [Diazepam] Other (See Comments)    Makes her want to " fly"    ROS Review of Systems  Constitutional:  Positive for fatigue. Negative for chills and fever.  HENT:  Negative for congestion, sinus pressure, sinus pain and sore throat.   Eyes:  Negative for pain and discharge.  Respiratory:  Negative for cough and shortness of breath.   Cardiovascular:  Negative for chest pain and palpitations.  Gastrointestinal:  Positive for constipation and nausea. Negative for abdominal pain, diarrhea and vomiting.  Endocrine: Negative for  polydipsia and polyuria.  Genitourinary:  Negative for dysuria and hematuria.  Musculoskeletal:  Positive for gait problem and myalgias. Negative for neck pain and neck stiffness.  Skin:  Negative for rash.  Neurological:  Positive for tremors, weakness and numbness. Negative for dizziness.  Psychiatric/Behavioral:  Positive for agitation, behavioral problems, decreased concentration, dysphoric mood and sleep disturbance. Negative for suicidal ideas. The patient is nervous/anxious.       Objective:    Physical Exam Vitals reviewed.  Constitutional:      General: She is not in acute distress.    Appearance: She is not diaphoretic.     Comments: Has rolling walker  HENT:     Head: Normocephalic and atraumatic.     Nose: Nose normal.     Mouth/Throat:     Mouth: Mucous membranes are moist.  Eyes:     General: No scleral icterus.    Extraocular Movements: Extraocular movements intact.  Cardiovascular:     Rate and Rhythm: Normal rate and regular rhythm.     Pulses: Normal pulses.     Heart sounds: Normal heart sounds. No murmur heard. Pulmonary:     Breath sounds: Normal breath sounds. No wheezing or rales.  Abdominal:     Palpations: Abdomen is soft.     Tenderness: There is no abdominal tenderness.  Musculoskeletal:     Cervical back: Neck supple. No tenderness.     Right lower leg: No edema.     Left lower leg: No edema.   Skin:    General: Skin is warm.     Findings: No rash.  Neurological:     General: No focal deficit present.     Mental Status: She is alert and oriented to person, place, and time.     Motor: Weakness (3/5 in b/l UE and LE) present.     Gait: Gait abnormal (Slow, unsteady gait).  Psychiatric:        Mood and Affect: Mood is anxious.        Behavior: Behavior is cooperative.     BP 122/84 (BP Location: Right Arm, Patient Position: Sitting, Cuff Size: Normal)   Pulse (!) 52   Resp 18   Ht 5' 6.5" (1.689 m)   Wt 131 lb 9.6 oz (59.7 kg)   SpO2 99%   BMI 20.92 kg/m  Wt Readings from Last 3 Encounters:  03/25/22 131 lb 9.6 oz (59.7 kg)  03/05/22 127 lb (57.6 kg)  02/11/22 129 lb (58.5 kg)    Lab Results  Component Value Date   TSH 1.670 02/06/2022   Lab Results  Component Value Date   WBC 6.4 03/19/2022   HGB 11.4 03/19/2022   HCT 34.8 03/19/2022   MCV 89 03/19/2022   PLT 200 03/19/2022   Lab Results  Component Value Date   NA 143 03/19/2022   K 4.0 03/19/2022   CO2 24 03/19/2022   GLUCOSE 76 03/19/2022   BUN 15 03/19/2022   CREATININE 1.04 (H) 03/19/2022   BILITOT 0.4 12/13/2021   ALKPHOS 46 12/13/2021   AST 6 (L) 12/13/2021   ALT 8 12/13/2021   PROT 5.6 (L) 12/13/2021   ALBUMIN 2.3 (L) 12/13/2021   CALCIUM 10.3 03/19/2022   ANIONGAP 6 12/17/2021   EGFR 56 (L) 03/19/2022   Lab Results  Component Value Date   CHOL 162 10/19/2021   Lab Results  Component Value Date   HDL 46 10/19/2021   Lab Results  Component Value Date   LDLCALC  97 10/19/2021   Lab Results  Component Value Date   TRIG 102 10/19/2021   Lab Results  Component Value Date   CHOLHDL 3.5 10/19/2021   Lab Results  Component Value Date   HGBA1C 4.9 10/19/2021      Assessment & Plan:   Problem List Items Addressed This Visit       Digestive   Gastroesophageal reflux disease without esophagitis - Primary    On Omeprazole as needed for GERD Reglan as needed for nausea, may  help with delayed emptying Needs to eat at regular intervals Avoid hot and spicy food Referred to GI for persistent nausea      Relevant Medications   metoCLOPramide (REGLAN) 5 MG tablet   Chronic idiopathic constipation    Continue Colace 100 mg BID MiraLAX as needed        Endocrine   Hyperthyroidism    Has multinodular goiter Followed by Dr. 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[place])    Has had ECT during his recent hospitalization Continue Zyprexa, Remeron and Lexapro Followed by psychiatry      Relevant Medications   escitalopram (LEXAPRO) 10 MG tablet   Sinus tachycardia    Likely due to underlying anxiety HR in 50s now, discontinue metoprolol      Refused influenza vaccine   Other Visit Diagnoses     Nausea       Relevant Medications   metoCLOPramide (REGLAN) 5 MG tablet   Vitamin D deficiency       Relevant Medications   Vitamin D, Ergocalciferol, (DRISDOL) 1.25 MG (50000 UNIT) CAPS capsule       Meds ordered this encounter  Medications   metoCLOPramide (REGLAN) 5 MG tablet    Sig: Take 1 tablet (5 mg total) by mouth every 8 (eight) hours as needed for nausea.    Dispense:  30 tablet    Refill:  1   Vitamin D, Ergocalciferol, (DRISDOL) 1.25 MG (50000 UNIT) CAPS capsule    Sig: Take 1 capsule (50,000 Units total) by mouth every 7 (seven) days.    Dispense:  5 capsule    Refill:  0    Follow-up: Return in about 4 months (around 07/25/2022).    Lindell Spar, MD

## 2022-03-25 NOTE — Assessment & Plan Note (Signed)
Has multinodular goiter Followed by Dr. Dorris Fetch On methimazole Her symptoms have been improving now

## 2022-03-25 NOTE — Assessment & Plan Note (Signed)
Continue Colace 100 mg BID MiraLAX as needed

## 2022-03-25 NOTE — Assessment & Plan Note (Signed)
Has had ECT during his recent hospitalization Continue Zyprexa, Remeron and Lexapro Followed by psychiatry

## 2022-03-25 NOTE — Assessment & Plan Note (Signed)
Likely due to underlying anxiety HR in 50s now, discontinue metoprolol

## 2022-03-25 NOTE — Patient Instructions (Signed)
Please take Bicarbonate half-tablet for 1 week and then discontinue.  Please stop taking Metoprolol.  Please continue taking other medications as prescribed.  Please continue to eat at regular intervals and ambulate as tolerated.

## 2022-03-25 NOTE — Assessment & Plan Note (Signed)
Needs to maintain adequate fluid intake Continue vitamin D supplement DC sodium bicarb to 650 mg QD as her bicarb levels are WNL, it could be causing pill esophagitis Avoid nephrotoxic agents

## 2022-04-29 ENCOUNTER — Ambulatory Visit (INDEPENDENT_AMBULATORY_CARE_PROVIDER_SITE_OTHER): Payer: Medicare Other | Admitting: Gastroenterology

## 2022-04-29 ENCOUNTER — Encounter (INDEPENDENT_AMBULATORY_CARE_PROVIDER_SITE_OTHER): Payer: Self-pay | Admitting: *Deleted

## 2022-04-29 ENCOUNTER — Encounter (INDEPENDENT_AMBULATORY_CARE_PROVIDER_SITE_OTHER): Payer: Self-pay | Admitting: Gastroenterology

## 2022-04-29 VITALS — BP 153/86 | HR 79 | Temp 97.1°F | Ht 66.5 in | Wt 136.7 lb

## 2022-04-29 DIAGNOSIS — R110 Nausea: Secondary | ICD-10-CM | POA: Diagnosis not present

## 2022-04-29 DIAGNOSIS — R198 Other specified symptoms and signs involving the digestive system and abdomen: Secondary | ICD-10-CM

## 2022-04-29 DIAGNOSIS — K589 Irritable bowel syndrome without diarrhea: Secondary | ICD-10-CM | POA: Diagnosis not present

## 2022-04-29 MED ORDER — ONDANSETRON 8 MG PO TBDP: 8.0000 mg | ORAL_TABLET | Freq: Two times a day (BID) | ORAL | 3 refills | Status: DC | PRN

## 2022-04-29 MED ORDER — NA SULFATE-K SULFATE-MG SULF 17.5-3.13-1.6 GM/177ML PO SOLN: ORAL | 0 refills | Status: DC

## 2022-04-29 NOTE — Progress Notes (Signed)
Amber [name], M.D. Gastroenterology & Hepatology [facility] [address] [place], [place] [address] Primary Care Physician: Amber Spar, MD [address] [place] [address]  Referring MD: PCP  Chief Complaint:  Chronic nausea  History of Present Illness: Amber [name] is a 76 y.o. female with PMH aortic atherosclerosis, depression with anxiety, PE, hyperthyroidism , h/o stroke, PE, who presents for evaluation of chronic nausea, rectal pressure and abdominal pain.  Patient reports that for the last 6 months she has presented recurrent nausea on a daily basis. She denies having any vertigo. Does not vomit - she actually is eating more than usual. Daughter states she eats cereal and milk frequently but the patient reported she is eating different kind of food.  However, she is feeling very frustrated as the nausea is present on a daily basis and has affected her quality of life. She has been taking Reglan 5 mg TID PRN, which she feels it helps a little but not very much. She was on ondansetronin the past, which she believes was somewhat better compared to rating.  The patient is also concerned as she also reports having intermittent episodes of rectal pressure for the last 6 months.  States that these episodes of pressure are happening intermittently but are uncomfortable.  She also reports having abdominal pain in her lower abdomen which does not improve with Bms. She has a BM almost every day, tries Miralax when she cannot remember to take it about she has been advised to take it daily.   Daughter reports a longstanding history of "GI complaints". She reports having changes in her bowel movement frequency or consistency fluid in the years as well as having abdominal pain episodes intermittently.  However, she never sought medical care for this.  Has been on Lexapro for last 3 months, had the nausea prior to starting this.  States she  has been taking Zyprexa since January 2023.  The patient denies having any nausea, vomiting, fever, chills, hematochezia, melena, hematemesis, abdominal distention,  diarrhea, jaundice, pruritus. She had significant medical issues in the last year that had her lose weight, but she reports that for the last few months she has been slowly gaining weight.  Last CT she had performed was in December 12, 2021 for evaluation of abdominal pain when she was found to have urinary stones as described below IMPRESSION: 1. Two obstructing ureteral calculi in the right UPJ and proximal right ureter, with reverse S shaped configuration of the proximal right ureter. Resultant severe right hydronephrosis and right perinephric stranding. 2. 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 3. Enlarged heart. 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  Last WCB:JSEGB Last Colonoscopy:never  FHx: neg for any gastrointestinal/liver disease, no malignancies Social: quit smoking 3 years, neg alcohol or illicit drug use Surgical: no abdominal surgeries  Past Medical History: Past Medical History:  Diagnosis Date   ABLA (acute blood loss anemia) 06/19/2021   Anxiety    Aortic atherosclerosis (HCC)    Aspiration pneumonia of right lung due to gastric secretions (HCC)    C. difficile colitis 07/13/2021   COVID-19 virus infection 06/08/2021    Depression    DVT of leg (deep venous thrombosis) ([place]) 05/2021   Dysphagia    Failure to thrive in adult    GERD (gastroesophageal reflux disease)    History of kidney stones  right ureter   Hypertension    Hyperthyroidism    Kidney disease    Pulmonary embolism ([place]) 05/2021   Stroke [facility])    showed up in MRI   Thoracic aortic aneurysm [facility])    Urinary incontinence     Past Surgical History: Past Surgical History:  Procedure Laterality Date    CYSTOSCOPY W/ URETERAL STENT PLACEMENT Right 12/12/2021   Procedure: CYSTOSCOPY WITH RETROGRADE PYELOGRAM/URETERAL STENT PLACEMENT;  Surgeon: Hollice Espy, MD;  Location: [facility] ORS;  Service: Urology;  Laterality: Right;   CYSTOSCOPY/URETEROSCOPY/HOLMIUM LASER/STENT PLACEMENT Right 01/14/2022   Procedure: CYSTOSCOPY/URETEROSCOPY/HOLMIUM LASER/STENT EXCHANGE;  Surgeon: Hollice Espy, MD;  Location: [facility] ORS;  Service: Urology;  Laterality: Right;    Family History: Family History  Problem Relation Age of Onset   High blood pressure Maternal Grandmother     Social History: Social History   Tobacco Use  Smoking Status Former   Types: Cigarettes   Quit date: 05/27/2018   Years since quitting: 3.9  Smokeless Tobacco Never   Social History   Substance and Sexual Activity  Alcohol Use Not Currently   Social History   Substance and Sexual Activity  Drug Use Not Currently    Allergies: Allergies  Allergen Reactions   Fish Allergy     Does not eat fish   Shellfish Allergy     "I pass out"   Valium [Diazepam] Other (See Comments)    Makes her want to " fly"    Medications: Current Outpatient Medications  Medication Sig Dispense Refill   acetaminophen (TYLENOL) 500 MG tablet Take 500 mg by mouth every 6 (six) hours as needed for moderate pain or mild pain.     docusate sodium (COLACE) 100 MG capsule Take 1 capsule by mouth 2 (two) times daily.     escitalopram (LEXAPRO) 20 MG tablet Take 20 mg by mouth daily.     ferrous sulfate 325 (65 FE) MG tablet Take 1 tablet (325 mg total) by mouth daily with breakfast. 30 tablet 0   methimazole (TAPAZOLE) 5 MG tablet Take 0.5 tablets (2.5 mg total) by mouth daily. 45 tablet 1   metoCLOPramide (REGLAN) 5 MG tablet Take 1 tablet (5 mg total) by mouth every 8 (eight) hours as needed for nausea. 30 tablet 1   mirtazapine (REMERON) 15 MG tablet Take 1 tablet (15 mg total) by mouth at bedtime. 90 tablet 0   Multiple Vitamin (MULTIVITAMIN  WITH MINERALS) TABS tablet Take 1 tablet by mouth daily. 30 tablet 0   OLANZapine (ZYPREXA) 7.5 MG tablet Take 1 tablet (7.5 mg total) by mouth at bedtime. 90 tablet 0   omeprazole (PRILOSEC) 40 MG capsule Take 1 capsule (40 mg total) by mouth daily. 30 capsule 3   polyethylene glycol powder (GLYCOLAX/MIRALAX) 17 GM/SCOOP powder Take 17 g by mouth as needed for mild constipation.     No current facility-administered medications for this visit.    Review of Systems: GENERAL: negative for malaise, night sweats HEENT: No changes in hearing or vision, no nose bleeds or other nasal problems. NECK: Negative for lumps, goiter, pain and significant neck swelling RESPIRATORY: Negative for cough, wheezing CARDIOVASCULAR: Negative for chest pain, leg swelling, palpitations, orthopnea GI: SEE HPI MUSCULOSKELETAL: Negative for joint pain or swelling, back pain, and muscle pain. SKIN: Negative for lesions, rash PSYCH: Negative for sleep disturbance, mood disorder and recent psychosocial stressors. HEMATOLOGY Negative for prolonged bleeding, bruising easily, and swollen nodes. ENDOCRINE: Negative for cold or heat intolerance, polyuria, polydipsia and  goiter. NEURO: negative for tremor, gait imbalance, syncope and seizures. The remainder of the review of systems is noncontributory.   Physical Exam: BP (!) 153/86 (BP Location: Left Arm, Patient Position: Sitting, Cuff Size: Small)   Pulse 79   Temp (!) 97.1 F (36.2 C) (Temporal)   Ht 5' 6.5" (1.689 m)   Wt 136 lb 11.2 oz (62 kg)   BMI 21.73 kg/m  GENERAL: The patient is AO x3, in no acute distress. HEENT: Head is normocephalic and atraumatic. EOMI are intact. Mouth is well hydrated and without lesions. NECK: Supple. No masses LUNGS: Clear to auscultation. No presence of rhonchi/wheezing/rales. Adequate chest expansion HEART: RRR, normal s1 and s2. ABDOMEN: Soft, nontender, no guarding, no peritoneal signs, and nondistended. BS +. No  masses. EXTREMITIES: Without any cyanosis, clubbing, rash, lesions or edema. NEUROLOGIC: AOx3, no focal motor deficit. SKIN: no jaundice, no rashes   Imaging/Labs: as above  I personally reviewed and interpreted the available labs, imaging and endoscopic files.  Impression and Plan: Amber [name] is a 76 y.o. female with PMH aortic atherosclerosis, depression with anxiety, PE, hyperthyroidism , h/o stroke, PE, who presents for evaluation of chronic nausea, rectal pressure and abdominal pain.  The patient has presented with recurrent episodes of nausea of unclear etiology.  She has had negative cross-sectional abdominal imaging recently.  Even though her nausea is present on a daily basis, she has been able to have an adequate oral intake.  She reported better symptom control with the use of Zofran compared to Reglan, for which we will change her current regimen to Zofran 8 mg dosing as needed and stop Reglan for now.  However, given the persistence of her symptoms we will evaluate for organic intraluminal etiologies with an EGD.  It is possible that part of her symptoms are related to the use of Zyprexa as at least 4% of patients can present chronic nausea with this medication -I advised to the patient and the family member to discuss with her psychiatrist if Zyprexa can be switched to another agent.  Will also check a CBC, CMP and celiac disease panel today.  She can advance her diet as tolerated and try to eat more protein to gain some more weight.  Finally, she is due for colorectal cancer screening, we will proceed with colonoscopy at the same time.  The patient was found to have elevated blood pressure when vital signs were checked in the office. The blood pressure was rechecked by the nursing staff and it was found be persistently elevated >140/90 mmHg. I personally advised to the patient to follow up closely with his PCP for hypertension control.  -Schedule EGD and colonoscopy - CBC, CMP  and celiac disease panel -Stop Reglan -Start Zofran 8 mg every 12 hours as needed for nausea -Advance diet as tolerated, try to eat more protein in meal. -Can discuss with psychiatrist if possiblity to switch Zyprexa to other agent.  All questions were answered.      Amber Peppers, MD Gastroenterology and Hepatology [facility]

## 2022-04-29 NOTE — Patient Instructions (Addendum)
Schedule EGD and colonoscopy Perform blood workup Stop Reglan Start Zofran 8 mg every 12 hours as needed for nausea Advance diet as tolerated, try to eat more protein in meal. Can discuss with psychiatrist if possiblity to switch Zyprexa to other agent.

## 2022-04-30 LAB — CBC WITH DIFFERENTIAL/PLATELET
Absolute Monocytes: 308 cells/uL (ref 200–950)
Basophils Absolute: 29 cells/uL (ref 0–200)
Basophils Relative: 0.5 %
Eosinophils Absolute: 29 cells/uL (ref 15–500)
Eosinophils Relative: 0.5 %
HCT: 37.2 % (ref 35.0–45.0)
Hemoglobin: 12.1 g/dL (ref 11.7–15.5)
Lymphs Abs: 1585 cells/uL (ref 850–3900)
MCH: 28.7 pg (ref 27.0–33.0)
MCHC: 32.5 g/dL (ref 32.0–36.0)
MCV: 88.2 fL (ref 80.0–100.0)
MPV: 11.4 fL (ref 7.5–12.5)
Monocytes Relative: 5.4 %
Neutro Abs: 3751 cells/uL (ref 1500–7800)
Neutrophils Relative %: 65.8 %
Platelets: 184 10*3/uL (ref 140–400)
RBC: 4.22 10*6/uL (ref 3.80–5.10)
RDW: 13.4 % (ref 11.0–15.0)
Total Lymphocyte: 27.8 %
WBC: 5.7 10*3/uL (ref 3.8–10.8)

## 2022-04-30 LAB — COMPREHENSIVE METABOLIC PANEL
AG Ratio: 1.5 (calc) (ref 1.0–2.5)
ALT: 5 U/L — ABNORMAL LOW (ref 6–29)
AST: 10 U/L (ref 10–35)
Albumin: 3.7 g/dL (ref 3.6–5.1)
Alkaline phosphatase (APISO): 70 U/L (ref 37–153)
BUN/Creatinine Ratio: 19 (calc) (ref 6–22)
BUN: 20 mg/dL (ref 7–25)
CO2: 28 mmol/L (ref 20–32)
Calcium: 10.5 mg/dL — ABNORMAL HIGH (ref 8.6–10.4)
Chloride: 109 mmol/L (ref 98–110)
Creat: 1.06 mg/dL — ABNORMAL HIGH (ref 0.60–1.00)
Globulin: 2.4 g/dL (calc) (ref 1.9–3.7)
Glucose, Bld: 107 mg/dL — ABNORMAL HIGH (ref 65–99)
Potassium: 3.9 mmol/L (ref 3.5–5.3)
Sodium: 143 mmol/L (ref 135–146)
Total Bilirubin: 0.4 mg/dL (ref 0.2–1.2)
Total Protein: 6.1 g/dL (ref 6.1–8.1)

## 2022-04-30 LAB — CELIAC DISEASE PANEL
(tTG) Ab, IgA: <1 U/mL
(tTG) Ab, IgG: 1.7 U/mL
Gliadin IgA: 1.6 U/mL
Gliadin IgG: <1 U/mL
Immunoglobulin A: 142 mg/dL (ref 70–320)

## 2022-06-06 ENCOUNTER — Other Ambulatory Visit: Payer: Self-pay | Admitting: Internal Medicine

## 2022-06-06 DIAGNOSIS — K219 Gastro-esophageal reflux disease without esophagitis: Secondary | ICD-10-CM

## 2022-06-08 LAB — TSH: TSH: 1.87 u[IU]/mL (ref 0.450–4.500)

## 2022-06-08 LAB — T4, FREE: Free T4: 0.98 ng/dL (ref 0.82–1.77)

## 2022-06-08 LAB — T3, FREE: T3, Free: 2.2 pg/mL (ref 2.0–4.4)

## 2022-06-12 ENCOUNTER — Encounter (HOSPITAL_COMMUNITY)
Admission: RE | Admit: 2022-06-12 | Discharge: 2022-06-12 | Disposition: A | Discharge status: Home / Self Care | Payer: Medicare Other | Source: Ambulatory Visit | Attending: Gastroenterology | Admitting: Gastroenterology

## 2022-06-14 ENCOUNTER — Encounter (HOSPITAL_COMMUNITY)
Admission: RE | Disposition: A | Discharge status: Home / Self Care | Payer: Self-pay | Source: Home / Self Care | Attending: Gastroenterology

## 2022-06-14 ENCOUNTER — Ambulatory Visit (HOSPITAL_BASED_OUTPATIENT_CLINIC_OR_DEPARTMENT_OTHER): Payer: Medicare Other | Admitting: Anesthesiology

## 2022-06-14 ENCOUNTER — Ambulatory Visit (HOSPITAL_COMMUNITY)
Admission: RE | Admit: 2022-06-14 | Discharge: 2022-06-14 | Disposition: A | Discharge status: Home / Self Care | Payer: Medicare Other | Attending: Gastroenterology | Admitting: Gastroenterology

## 2022-06-14 ENCOUNTER — Ambulatory Visit (HOSPITAL_COMMUNITY): Payer: Medicare Other | Admitting: Anesthesiology

## 2022-06-14 DIAGNOSIS — K635 Polyp of colon: Secondary | ICD-10-CM

## 2022-06-14 DIAGNOSIS — F418 Other specified anxiety disorders: Secondary | ICD-10-CM | POA: Diagnosis not present

## 2022-06-14 DIAGNOSIS — Z1211 Encounter for screening for malignant neoplasm of colon: Secondary | ICD-10-CM | POA: Diagnosis present

## 2022-06-14 DIAGNOSIS — Z86711 Personal history of pulmonary embolism: Secondary | ICD-10-CM | POA: Insufficient documentation

## 2022-06-14 DIAGNOSIS — K2950 Unspecified chronic gastritis without bleeding: Secondary | ICD-10-CM | POA: Diagnosis not present

## 2022-06-14 DIAGNOSIS — K219 Gastro-esophageal reflux disease without esophagitis: Secondary | ICD-10-CM | POA: Diagnosis not present

## 2022-06-14 DIAGNOSIS — D175 Benign lipomatous neoplasm of intra-abdominal organs: Secondary | ICD-10-CM | POA: Diagnosis not present

## 2022-06-14 DIAGNOSIS — K5730 Diverticulosis of large intestine without perforation or abscess without bleeding: Secondary | ICD-10-CM

## 2022-06-14 DIAGNOSIS — R109 Unspecified abdominal pain: Secondary | ICD-10-CM | POA: Diagnosis not present

## 2022-06-14 DIAGNOSIS — K319 Disease of stomach and duodenum, unspecified: Secondary | ICD-10-CM | POA: Insufficient documentation

## 2022-06-14 DIAGNOSIS — K649 Unspecified hemorrhoids: Secondary | ICD-10-CM | POA: Diagnosis not present

## 2022-06-14 DIAGNOSIS — D123 Benign neoplasm of transverse colon: Secondary | ICD-10-CM

## 2022-06-14 DIAGNOSIS — N189 Chronic kidney disease, unspecified: Secondary | ICD-10-CM | POA: Diagnosis not present

## 2022-06-14 DIAGNOSIS — E0590 Thyrotoxicosis, unspecified without thyrotoxic crisis or storm: Secondary | ICD-10-CM | POA: Insufficient documentation

## 2022-06-14 DIAGNOSIS — Z8673 Personal history of transient ischemic attack (TIA), and cerebral infarction without residual deficits: Secondary | ICD-10-CM | POA: Insufficient documentation

## 2022-06-14 DIAGNOSIS — R110 Nausea: Secondary | ICD-10-CM

## 2022-06-14 DIAGNOSIS — I10 Essential (primary) hypertension: Secondary | ICD-10-CM | POA: Insufficient documentation

## 2022-06-14 DIAGNOSIS — Z87442 Personal history of urinary calculi: Secondary | ICD-10-CM | POA: Insufficient documentation

## 2022-06-14 DIAGNOSIS — Z87891 Personal history of nicotine dependence: Secondary | ICD-10-CM | POA: Insufficient documentation

## 2022-06-14 DIAGNOSIS — D649 Anemia, unspecified: Secondary | ICD-10-CM | POA: Insufficient documentation

## 2022-06-14 DIAGNOSIS — Z79899 Other long term (current) drug therapy: Secondary | ICD-10-CM | POA: Diagnosis not present

## 2022-06-14 DIAGNOSIS — Z13820 Encounter for screening for osteoporosis: Secondary | ICD-10-CM

## 2022-06-14 HISTORY — PX: COLONOSCOPY WITH PROPOFOL: SHX5780

## 2022-06-14 HISTORY — PX: ESOPHAGOGASTRODUODENOSCOPY (EGD) WITH PROPOFOL: SHX5813

## 2022-06-14 HISTORY — PX: BIOPSY: SHX5522

## 2022-06-14 HISTORY — PX: POLYPECTOMY: SHX5525

## 2022-06-14 LAB — HM COLONOSCOPY

## 2022-06-14 SURGERY — COLONOSCOPY WITH PROPOFOL
Anesthesia: General

## 2022-06-14 MED ORDER — PHENYLEPHRINE HCL (PRESSORS) 10 MG/ML IV SOLN
INTRAVENOUS | Status: DC | PRN
  Administered 2022-06-14 (×2): 80 ug via INTRAVENOUS

## 2022-06-14 MED ORDER — LIDOCAINE HCL 1 % IJ SOLN
INTRAMUSCULAR | Status: DC | PRN
  Administered 2022-06-14: 50 mg via INTRADERMAL

## 2022-06-14 MED ORDER — LACTATED RINGERS IV SOLN: INTRAVENOUS | Status: DC | PRN

## 2022-06-14 MED ORDER — PROPOFOL 500 MG/50ML IV EMUL
INTRAVENOUS | Status: DC | PRN
  Administered 2022-06-14: 150 ug/kg/min via INTRAVENOUS

## 2022-06-14 MED ORDER — PROPOFOL 10 MG/ML IV BOLUS
INTRAVENOUS | Status: DC | PRN
  Administered 2022-06-14 (×3): 50 mg via INTRAVENOUS

## 2022-06-14 NOTE — Transfer of Care (Signed)
Immediate Anesthesia Transfer of Care Note  Patient: Amber [name]  Procedure(s) Performed: COLONOSCOPY WITH PROPOFOL ESOPHAGOGASTRODUODENOSCOPY (EGD) WITH PROPOFOL BIOPSY POLYPECTOMY  Patient Location: Short Stay  Anesthesia Type:General  Level of Consciousness: awake  Airway & Oxygen Therapy: Patient Spontanous Breathing  Post-op Assessment: Report given to RN  Post vital signs: Reviewed and stable  Last Vitals:  Vitals Value Taken Time  BP    Temp    Pulse    Resp    SpO2      Last Pain:  Vitals:   06/14/22 1033  TempSrc: Oral  PainSc: 0-No pain      Patients Stated Pain Goal: 5 (57/49/35 5217)  Complications: No notable events documented.

## 2022-06-14 NOTE — H&P (Signed)
Amber [name] is an 77 y.o. female.   Chief Complaint: abdominal pain, nausea and colorectal cancer screening HPI: 77 y/o F with PMH aortic atherosclerosis, depression with anxiety, PE, hyperthyroidism , h/o stroke, PE, who presents for evaluation of chronic nausea, coming for evaluation of abdominal pain, nausea and colorectal cancer screening.  Patient reports lower abdominal pian intermittently.Also has had recurrent nausea episodes without vomiting.  Never had EGD or colonoscopy in the past.  Past Medical History:  Diagnosis Date   ABLA (acute blood loss anemia) 06/19/2021   Anxiety    Aortic atherosclerosis (HCC)    Aspiration pneumonia of right lung due to gastric secretions (HCC)    C. difficile colitis 07/13/2021   COVID-19 virus infection 06/08/2021    Depression    DVT of leg (deep venous thrombosis) ([place]) 05/2021   Dysphagia    Failure to thrive in adult    GERD (gastroesophageal reflux disease)    History of kidney stones    right ureter   Hypertension    Hyperthyroidism    Kidney disease    Pulmonary embolism ([place]) 05/2021   Stroke ([place])    showed up in MRI   Thoracic aortic aneurysm [facility])    Urinary incontinence     Past Surgical History:  Procedure Laterality Date   CYSTOSCOPY W/ URETERAL STENT PLACEMENT Right 12/12/2021   Procedure: CYSTOSCOPY WITH RETROGRADE PYELOGRAM/URETERAL STENT PLACEMENT;  Surgeon: Hollice Espy, MD;  Location: [facility] ORS;  Service: Urology;  Laterality: Right;   CYSTOSCOPY/URETEROSCOPY/HOLMIUM LASER/STENT PLACEMENT Right 01/14/2022   Procedure: CYSTOSCOPY/URETEROSCOPY/HOLMIUM LASER/STENT EXCHANGE;  Surgeon: Hollice Espy, MD;  Location: [facility] ORS;  Service: Urology;  Laterality: Right;    Family History  Problem Relation Age of Onset   High blood pressure Maternal Grandmother    Social History:  reports that she quit smoking about 4 years ago. Her smoking use included cigarettes. She has never used smokeless tobacco. She reports that  she does not currently use alcohol. She reports that she does not currently use drugs.  Allergies:  Allergies  Allergen Reactions   Fish Allergy     Does not eat fish   Shellfish Allergy     "I pass out"   Valium [Diazepam] Other (See Comments)    Makes her want to " fly"    Medications Prior to Admission  Medication Sig Dispense Refill   acetaminophen (TYLENOL) 500 MG tablet Take 500 mg by mouth every 6 (six) hours as needed for moderate pain or mild pain.     docusate sodium (COLACE) 100 MG capsule Take 100 mg by mouth 2 (two) times daily.     escitalopram (LEXAPRO) 20 MG tablet Take 20 mg by mouth daily.     methimazole (TAPAZOLE) 5 MG tablet Take 0.5 tablets (2.5 mg total) by mouth daily. 45 tablet 1   mirtazapine (REMERON) 15 MG tablet Take 1 tablet (15 mg total) by mouth at bedtime. 90 tablet 0   Multiple Vitamin (MULTIVITAMIN WITH MINERALS) TABS tablet Take 1 tablet by mouth daily. 30 tablet 0   OLANZapine (ZYPREXA) 5 MG tablet Take 5 mg by mouth in the morning and at bedtime.     omeprazole (PRILOSEC) 40 MG capsule TAKE ONE CAPSULE BY MOUTH ONCE DAILY. 30 capsule 0   ondansetron (ZOFRAN-ODT) 8 MG disintegrating tablet Take 1 tablet (8 mg total) by mouth every 12 (twelve) hours as needed for nausea or vomiting. 60 tablet 3   polyethylene glycol powder (GLYCOLAX/MIRALAX) 17 GM/SCOOP powder Take 17  g by mouth as needed for mild constipation.     ferrous sulfate 325 (65 FE) MG tablet Take 1 tablet (325 mg total) by mouth daily with breakfast. 30 tablet 0   Na Sulfate-K Sulfate-Mg Sulf 17.5-3.13-1.6 GM/177ML SOLN As directed 354 mL 0   OLANZapine (ZYPREXA) 7.5 MG tablet Take 1 tablet (7.5 mg total) by mouth at bedtime. (Patient not taking: Reported on 06/06/2022) 90 tablet 0    No results found for this or any previous visit (from the past 48 hour(s)). No results found.  Review of Systems  Gastrointestinal:  Positive for nausea.  All other systems reviewed and are  negative.   Blood pressure (!) 178/97, pulse 74, temperature 97.8 F (36.6 C), temperature source Oral, SpO2 99 %. Physical Exam  GENERAL: The patient is AO x3, in no acute distress. HEENT: Head is normocephalic and atraumatic. EOMI are intact. Mouth is well hydrated and without lesions. NECK: Supple. No masses LUNGS: Clear to auscultation. No presence of rhonchi/wheezing/rales. Adequate chest expansion HEART: RRR, normal s1 and s2. ABDOMEN: Soft, nontender, no guarding, no peritoneal signs, and nondistended. BS +. No masses. EXTREMITIES: Without any cyanosis, clubbing, rash, lesions or edema. NEUROLOGIC: AOx3, no focal motor deficit. SKIN: no jaundice, no rashes  Assessment/Plan 77 y/o F with PMH aortic atherosclerosis, depression with anxiety, PE, hyperthyroidism , h/o stroke, PE, who presents for evaluation of chronic nausea, coming for evaluation of abdominal pain, nausea and colorectal cancer screening. Will proceed with EGD and colonoscopy.  Harvel Quale, MD 06/14/2022, 11:01 AM

## 2022-06-14 NOTE — Discharge Instructions (Addendum)
You are being discharged to home.  Resume your previous diet.  We are waiting for your pathology results. Discuss possibility of switching Zyprexa for other medications Your physician has indicated that a repeat colonoscopy is not recommended due to your current age (69 years or older) for screening purposes.

## 2022-06-14 NOTE — Anesthesia Postprocedure Evaluation (Signed)
Anesthesia Post Note  Patient: Amber [name]  Procedure(s) Performed: COLONOSCOPY WITH PROPOFOL ESOPHAGOGASTRODUODENOSCOPY (EGD) WITH PROPOFOL BIOPSY POLYPECTOMY  Patient location during evaluation: Short Stay Anesthesia Type: General Level of consciousness: awake and alert Pain management: pain level controlled Vital Signs Assessment: post-procedure vital signs reviewed and stable Respiratory status: spontaneous breathing Cardiovascular status: blood pressure returned to baseline and stable Postop Assessment: no apparent nausea or vomiting Anesthetic complications: no   No notable events documented.   Last Vitals:  Vitals:   06/14/22 1033  BP: (!) 178/97  Pulse: 74  Temp: 36.6 C  SpO2: 99%    Last Pain:  Vitals:   06/14/22 1033  TempSrc: Oral  PainSc: 0-No pain                 [name]

## 2022-06-14 NOTE — Op Note (Signed)
[facility] Patient Name: Natalya Domzalski Procedure Date: 06/14/2022 10:47 AM MRN: [id number] Date of Birth: 07-Aug-1945 Attending MD: Maylon Peppers , , [id number] CSN: [id number] Age: 77 Admit Type: Outpatient Procedure:                Upper GI endoscopy Indications:               Providers:                Maylon Peppers, Lurline Del, RN, Randa Spike,                            Technician Referring MD:              Medicines:                Monitored Anesthesia Care Complications:            No immediate complications. Estimated Blood Loss:     Estimated blood loss: none. Procedure:                Pre-Anesthesia Assessment:                           - Prior to the procedure, a History and Physical                            was performed, and patient medications, allergies                            and sensitivities were reviewed. The patient's                            tolerance of previous anesthesia was reviewed.                           - The risks and benefits of the procedure and the                            sedation options and risks were discussed with the                            patient. All questions were answered and informed                            consent was obtained.                           - ASA Grade Assessment: II - A patient with mild                            systemic disease.                           After obtaining informed consent, the endoscope was                            passed under direct vision. Throughout the  procedure, the patient's blood pressure, pulse, and                            oxygen saturations were monitored continuously. The                            [id number]-[id number] ([id number]) scope was introduced through the                            mouth, and advanced to the second part of duodenum.                            The upper GI endoscopy was accomplished without                             difficulty. The patient tolerated the procedure                            well. Scope In: 11:48:21 AM Scope Out: 11:53:03 AM Total Procedure Duration: 0 hours 4 minutes 42 seconds  Findings:      The examined esophagus was normal.      The entire examined stomach was normal. Biopsies were taken with a cold       forceps for Helicobacter pylori testing.      There was a medium-sized lipoma in the second portion of the duodenum. Impression:               - Normal esophagus.                           - Normal stomach. Biopsied.                           - Duodenal lipoma. Moderate Sedation:      Per Anesthesia Care Recommendation:           - Discharge patient to home (ambulatory).                           - Resume previous diet.                           - Await pathology results. Procedure Code(s):        --- Professional ---                           [phone], Esophagogastroduodenoscopy, flexible,                            transoral; with biopsy, single or multiple Diagnosis Code(s):        --- Professional ---                           D17.5, Benign lipomatous neoplasm of                            intra-abdominal organs CPT copyright 2022 American Medical Association. All rights reserved. The  codes documented in this report are preliminary and upon coder review may  be revised to meet current compliance requirements. Maylon Peppers, MD Maylon Peppers,  06/14/2022 11:57:51 AM This report has been signed electronically. Number of Addenda: 0

## 2022-06-14 NOTE — Op Note (Signed)
[facility] Patient Name: Amber [name] Procedure Date: 06/14/2022 11:57 AM MRN: [id number] Date of Birth: Sep 19, 1945 Attending MD: Maylon Peppers , , [id number] CSN: [id number] Age: 77 Admit Type: Outpatient Procedure:                Colonoscopy Indications:              Screening for colorectal malignant neoplasm Providers:                Maylon Peppers, Lurline Del, RN, Randa Spike,                            Technician Referring MD:              Medicines:                Monitored Anesthesia Care Complications:            No immediate complications. Estimated Blood Loss:     Estimated blood loss: none. Procedure:                Pre-Anesthesia Assessment:                           - Prior to the procedure, a History and Physical                            was performed, and patient medications, allergies                            and sensitivities were reviewed. The patient's                            tolerance of previous anesthesia was reviewed.                           - The risks and benefits of the procedure and the                            sedation options and risks were discussed with the                            patient. All questions were answered and informed                            consent was obtained.                           - ASA Grade Assessment: II - A patient with mild                            systemic disease.                           After obtaining informed consent, the colonoscope                            was passed under direct vision. Throughout the  procedure, the patient's blood pressure, pulse, and                            oxygen saturations were monitored continuously. The                            [id number]-[id number] ([id number]) scope was introduced through                            the anus and advanced to the the cecum, identified                            by appendiceal orifice and ileocecal valve. The                             colonoscopy was performed without difficulty. The                            patient tolerated the procedure well. The quality                            of the bowel preparation was excellent. Scope In: 11:58:54 AM Scope Out: 12:17:04 PM Scope Withdrawal Time: 0 hours 13 minutes 8 seconds  Total Procedure Duration: 0 hours 18 minutes 10 seconds  Findings:      Hemorrhoids were found on perianal exam.      A 4 mm polyp was found in the transverse colon. The polyp was sessile.       The polyp was removed with a cold snare. Resection and retrieval were       complete.      Scattered medium-mouthed and small-mouthed diverticula were found in the       sigmoid colon, descending colon and ascending colon.      The retroflexed view of the distal rectum and anal verge was normal and       showed no anal or rectal abnormalities. Impression:               - Hemorrhoids found on perianal exam.                           - One 4 mm polyp in the transverse colon, removed                            with a cold snare. Resected and retrieved.                           - Diverticulosis in the sigmoid colon, in the                            descending colon and in the ascending colon.                           - The distal rectum and anal verge are normal on  retroflexion view. Moderate Sedation:      Per Anesthesia Care Recommendation:           - Discharge patient to home (ambulatory).                           - Resume previous diet.                           - Await pathology results.                           - Repeat colonoscopy is not recommended due to                            current age (52 years or older) for screening                            purposes. Procedure Code(s):        --- Professional ---                           [phone], Colonoscopy, flexible; with removal of                            tumor(s), polyp(s), or other lesion(s)  by snare                            technique Diagnosis Code(s):        --- Professional ---                           Z12.11, Encounter for screening for malignant                            neoplasm of colon                           D12.3, Benign neoplasm of transverse colon (hepatic                            flexure or splenic flexure)                           K64.9, Unspecified hemorrhoids                           K57.30, Diverticulosis of large intestine without                            perforation or abscess without bleeding CPT copyright 2022 American Medical Association. All rights reserved. The codes documented in this report are preliminary and upon coder review may  be revised to meet current compliance requirements. Maylon Peppers, MD Maylon Peppers,  06/14/2022 12:22:16 PM This report has been signed electronically. Number of Addenda: 0

## 2022-06-14 NOTE — Anesthesia Preprocedure Evaluation (Signed)
Anesthesia Evaluation  Patient identified by MRN, date of birth, ID band Patient awake    Reviewed: Allergy & Precautions, NPO status , Patient's Chart, lab work & pertinent test results  History of Anesthesia Complications Negative for: history of anesthetic complications  Airway Mallampati: III  TM Distance: >3 FB Neck ROM: full  Mouth opening: Limited Mouth Opening  Dental  (+) Dental Advidsory Given, Upper Dentures, Lower Dentures, Missing   Pulmonary neg shortness of breath, asthma , neg sleep apnea, neg COPD, neg recent URI, former smoker Pulmonary embolism  05/2021   Pulmonary exam normal  + decreased breath sounds      Cardiovascular Exercise Tolerance: Poor hypertension, Pt. on medications (-) angina (-) Past MI and (-) Cardiac Stents Normal cardiovascular exam+ dysrhythmias (-) Valvular Problems/Murmurs Rate:Tachycardia     Neuro/Psych neg Seizures PSYCHIATRIC DISORDERS Anxiety Depression     Neuromuscular disease CVA, No Residual Symptoms    GI/Hepatic Neg liver ROS,GERD  ,,  Endo/Other  neg diabetes Hyperthyroidism   Renal/GU CRFRenal disease (kidney stone)     Musculoskeletal negative musculoskeletal ROS (+)    Abdominal Normal abdominal exam  (+)   Peds negative pediatric ROS (+)  Hematology  (+) Blood dyscrasia, anemia   Anesthesia Other Findings Past Medical History: No date: Anxiety No date: Hypertension   Reproductive/Obstetrics negative OB ROS                             Anesthesia Physical Anesthesia Plan  ASA: 3  Anesthesia Plan: General   Post-op Pain Management:    Induction: Intravenous  PONV Risk Score and Plan:   Airway Management Planned: Nasal Cannula  Additional Equipment:   Intra-op Plan:   Post-operative Plan:   Informed Consent: I have reviewed the patients History and Physical, chart, labs and discussed the procedure including the risks,  benefits and alternatives for the proposed anesthesia with the patient or authorized representative who has indicated his/her understanding and acceptance.       Plan Discussed with: CRNA  Anesthesia Plan Comments:         Anesthesia Quick Evaluation

## 2022-06-17 ENCOUNTER — Encounter (INDEPENDENT_AMBULATORY_CARE_PROVIDER_SITE_OTHER): Payer: Self-pay | Admitting: *Deleted

## 2022-06-17 ENCOUNTER — Encounter: Payer: Self-pay | Admitting: "Endocrinology

## 2022-06-17 ENCOUNTER — Ambulatory Visit (INDEPENDENT_AMBULATORY_CARE_PROVIDER_SITE_OTHER): Payer: Medicare Other | Admitting: "Endocrinology

## 2022-06-17 VITALS — BP 136/84 | HR 64 | Ht 66.5 in | Wt 144.6 lb

## 2022-06-17 DIAGNOSIS — E042 Nontoxic multinodular goiter: Secondary | ICD-10-CM

## 2022-06-17 DIAGNOSIS — E0590 Thyrotoxicosis, unspecified without thyrotoxic crisis or storm: Secondary | ICD-10-CM | POA: Diagnosis not present

## 2022-06-17 LAB — SURGICAL PATHOLOGY

## 2022-06-17 NOTE — Progress Notes (Signed)
06/17/2022    Endocrinology follow-up note   Subjective:    Patient ID: Amber [name], female    DOB: 10-Apr-1946, PCP Amber Spar, MD.   Past Medical History:  Diagnosis Date   ABLA (acute blood loss anemia) 06/19/2021   Anxiety    Aortic atherosclerosis (HCC)    Aspiration pneumonia of right lung due to gastric secretions ([place])    C. difficile colitis 07/13/2021   COVID-19 virus infection 06/08/2021    Depression    DVT of leg (deep venous thrombosis) ([place]) 05/2021   Dysphagia    Failure to thrive in adult    GERD (gastroesophageal reflux disease)    History of kidney stones    right ureter   Hypertension    Hyperthyroidism    Kidney disease    Pulmonary embolism ([place]) 05/2021   Stroke ([place])    showed up in MRI   Thoracic aortic aneurysm [facility]    Urinary incontinence     Past Surgical History:  Procedure Laterality Date   CYSTOSCOPY W/ URETERAL STENT PLACEMENT Right 12/12/2021   Procedure: CYSTOSCOPY WITH RETROGRADE PYELOGRAM/URETERAL STENT PLACEMENT;  Surgeon: Hollice Espy, MD;  Location: [facility] ORS;  Service: Urology;  Laterality: Right;   CYSTOSCOPY/URETEROSCOPY/HOLMIUM LASER/STENT PLACEMENT Right 01/14/2022   Procedure: CYSTOSCOPY/URETEROSCOPY/HOLMIUM LASER/STENT EXCHANGE;  Surgeon: Hollice Espy, MD;  Location: [facility] ORS;  Service: Urology;  Laterality: Right;    Social History   Socioeconomic History   Marital status: Married    Spouse name: Amber [name]   Number of children: 5   Years of education: Not on file   Highest education level: Not on file  Occupational History   Not on file  Tobacco Use   Smoking status: Former    Types: Cigarettes    Quit date: 05/27/2018    Years since quitting: 4.0   Smokeless tobacco: Never  Vaping Use   Vaping Use: Never used  Substance and Sexual Activity   Alcohol use: Not Currently   Drug use: Not Currently   Sexual activity: Not Currently  Other Topics Concern   Not on file  Social History Narrative    Right handed    one story home    lives with family    Doesn't work currently   Social Determinants of Radio broadcast assistant Strain: Not on file  Food Insecurity: Not on file  Transportation Needs: Not on file  Physical Activity: Not on file  Stress: Not on file  Social Connections: Not on file    Family History  Problem Relation Age of Onset   High blood pressure Maternal Grandmother     Outpatient Encounter Medications as of 06/17/2022  Medication Sig   acetaminophen (TYLENOL) 500 MG tablet Take 500 mg by mouth every 6 (six) hours as needed for moderate pain or mild pain.   docusate sodium (COLACE) 100 MG capsule Take 100 mg by mouth 2 (two) times daily.   escitalopram (LEXAPRO) 20 MG tablet Take 20 mg by mouth daily.   ferrous sulfate 325 (65 FE) MG tablet Take 1 tablet (325 mg total) by mouth daily with breakfast.   methimazole (TAPAZOLE) 5 MG tablet Take 0.5 tablets (2.5 mg total) by mouth daily.   mirtazapine (REMERON) 15 MG tablet Take 1 tablet (15 mg total) by mouth at bedtime.   Multiple Vitamin (MULTIVITAMIN WITH MINERALS) TABS tablet Take 1 tablet by mouth daily.   OLANZapine (ZYPREXA) 5 MG tablet Take 5 mg by mouth in the  morning and at bedtime.   omeprazole (PRILOSEC) 40 MG capsule TAKE ONE CAPSULE BY MOUTH ONCE DAILY.   ondansetron (ZOFRAN-ODT) 8 MG disintegrating tablet Take 1 tablet (8 mg total) by mouth every 12 (twelve) hours as needed for nausea or vomiting.   polyethylene glycol powder (GLYCOLAX/MIRALAX) 17 GM/SCOOP powder Take 17 g by mouth as needed for mild constipation.   [DISCONTINUED] OLANZapine (ZYPREXA) 7.5 MG tablet Take 1 tablet (7.5 mg total) by mouth at bedtime. (Patient not taking: Reported on 06/06/2022)   No facility-administered encounter medications on file as of 06/17/2022.    ALLERGIES: Allergies  Allergen Reactions   Fish Allergy     Does not eat fish   Shellfish Allergy     "I pass out"   Valium [Diazepam] Other (See Comments)     Makes her want to " fly"    VACCINATION STATUS:  There is no immunization history on file for this patient.   HPI  Amber [name] is 77 y.o. female who presents today with a medical history as above. she is being seen in follow-up after she was seen in consultation for hyperthyroidism requested by Amber Spar, MD.    After thyroid uptake and scan showed cold nodule in the left lobe of her thyroid, she was considered for dedicated thyroid ultrasound which confirms 2.8 cm suspicious nodule corresponding to the area of cold nodule on the uptake and scan. She underwent  FNA biopsy of this nodule with inconclusive cytology which needed molecular study. Her afirma results are benign. she has been dealing with symptoms of anxiety, weight loss, sleep disturbance for several weeks. She was started on low dose methimazole for mild hyperthyroidism.  She is currently on methimazole 2.5 mg p.o. daily with continued clinical response.    She returns with improved thyroid function tests. she denies dysphagia, choking, shortness of breath, no recent voice change.   She c/o nausea , scheduled to see GI. she denies family history of thyroid dysfunction nor thyroid malignancy. she denies personal history of goiter. she is not on any anti-thyroid medications nor on any thyroid hormone supplements. she  is willing to proceed with appropriate work up and therapy for thyrotoxicosis. She is accompanied by her daughter.                           Review of systems  Constitutional: + Steady weight gain, + fatigue, + subjective hyperthermia Eyes: no blurry vision, +xerophthalmia ENT: no sore throat, no nodules palpated in throat, no dysphagia/odynophagia, nor hoarseness    Objective:    BP 136/84   Pulse 64   Ht 5' 6.5" (1.689 m)   Wt 144 lb 9.6 oz (65.6 kg)   BMI 22.99 kg/m   Wt Readings from Last 3 Encounters:  06/17/22 144 lb 9.6 oz (65.6 kg)  04/29/22 136 lb 11.2 oz (62 kg)  03/25/22 131 lb  9.6 oz (59.7 kg)                                                Physical exam  Constitutional: Body mass index is 22.99 kg/m., not in acute distress, + anxious state of mind Eyes: PERRLA, EOMI,  exophthalmos    CMP     Component Value Date/Time   NA 143 04/29/2022 1143   NA  143 03/19/2022 1108   K 3.9 04/29/2022 1143   CL 109 04/29/2022 1143   CO2 28 04/29/2022 1143   GLUCOSE 107 (H) 04/29/2022 1143   BUN 20 04/29/2022 1143   BUN 15 03/19/2022 1108   CREATININE 1.06 (H) 04/29/2022 1143   CALCIUM 10.5 (H) 04/29/2022 1143   PROT 6.1 04/29/2022 1143   PROT 6.5 05/04/2021 1531   ALBUMIN 2.3 (L) 12/13/2021 0512   ALBUMIN 3.4 (L) 05/04/2021 1531   AST 10 04/29/2022 1143   AST 9 (L) 10/23/2021 1411   ALT 5 (L) 04/29/2022 1143   ALT 6 10/23/2021 1411   ALKPHOS 46 12/13/2021 0512   BILITOT 0.4 04/29/2022 1143   BILITOT 0.4 10/23/2021 1411   GFRNONAA >60 12/17/2021 0555   GFRNONAA 46 (L) 10/23/2021 1411     CBC    Component Value Date/Time   WBC 5.7 04/29/2022 1143   RBC 4.22 04/29/2022 1143   HGB 12.1 04/29/2022 1143   HGB 11.4 03/19/2022 1108   HCT 37.2 04/29/2022 1143   HCT 34.8 03/19/2022 1108   PLT 184 04/29/2022 1143   PLT 200 03/19/2022 1108   MCV 88.2 04/29/2022 1143   MCV 89 03/19/2022 1108   MCH 28.7 04/29/2022 1143   MCHC 32.5 04/29/2022 1143   RDW 13.4 04/29/2022 1143   RDW 14.2 03/19/2022 1108   LYMPHSABS 1,585 04/29/2022 1143   LYMPHSABS 2.1 03/19/2022 1108   MONOABS 0.5 12/12/2021 1515   EOSABS 29 04/29/2022 1143   EOSABS 0.1 03/19/2022 1108   BASOSABS 29 04/29/2022 1143   BASOSABS 0.0 03/19/2022 1108     Lab Results  Component Value Date   TSH 1.870 06/07/2022   TSH 1.670 02/06/2022   TSH 0.060 (L) 12/12/2021   TSH 0.022 (L) 08/30/2021   TSH <0.080 (L) 08/10/2021   TSH 0.545 06/09/2021   TSH 0.559 06/08/2021   TSH 1.560 05/04/2021   TSH 0.152 (L) 03/29/2021   FREET4 0.98 06/07/2022   FREET4 0.99 02/06/2022   FREET4 1.16 (H)  12/12/2021   FREET4 1.43 08/30/2021     Thyroid uptake and scan on 09/11/21 4 hour I-123 uptake = 2.9% (normal 5-20%),  24 hour I-123 uptake = 11.9% (normal 10-30%)   IMPRESSION: 1. Decreased iodine uptake at 4 hours, with low normal uptake at 24 hours. 2. Diffuse heterogeneity of the thyroid parenchyma, with equivocal findings for cold nodule corresponding to the mid left lobe thyroid lesion seen on prior CT. Correlation with ultrasound is recommended to help guide possible sampling.   FNA- AUS Afirma - Benign  Assessment & Plan:   1. Hyperthyroidism 2.  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 3.  Hypercalcemia: She was recently diagnosed with mild hypercalcemia  I reviewed her labs and thyroid scan with her and her daughter.  She is responding to methimazole intervention.  Her thyroid function test are acceptable and the current levels.  She is advised to continue methimazole 2.5 mg p.o. daily with breakfast with plan to repeat thyroid function test with office visit in 4 months.  She has had benign results of fine-needle aspiration and molecular study to 2.8 cm nodule in the left lobe of her thyroid.   She is already on Metoprolol, no need for additional Beta blocker. Her hypercalcemia is a new observation, etiology not stated.  She will have PTH/calcium, magnesium, phosphorus along with her next labs.   She is advised to wear a compression LE sleeve  to help with peripheral dependent edema. -Patient is advised to maintain close follow up with Amber Spar, MD for primary care needs.    I spent 21 minutes in the care of the patient today including review of labs from Thyroid Function, CMP, and other relevant labs ; imaging/biopsy records (current and previous including abstractions from other facilities); face-to-face time discussing  her lab results and symptoms, medications doses, her options of short and [name] term  treatment based on the latest standards of care / guidelines;   and documenting the encounter.  Amber [name]  participated in the discussions, expressed understanding, and voiced agreement with the above plans.  All questions were answered to her satisfaction. she is encouraged to contact clinic should she have any questions or concerns prior to her return visit.   Follow up plan: Return in about 4 months (around 10/16/2022) for F/U with Pre-visit Labs.   Thank you for involving me in the care of this pleasant patient, and I will continue to update you with her progress.  Glade Lloyd, MD [facility] [place] [facility] Phone: [phone]  Fax: [phone]   06/17/2022, 5:26 PM  This note was partially dictated with voice recognition software. Similar sounding words can be transcribed inadequately or may not  be corrected upon review.

## 2022-06-19 ENCOUNTER — Encounter (HOSPITAL_COMMUNITY): Payer: Self-pay | Admitting: Gastroenterology

## 2022-06-21 ENCOUNTER — Other Ambulatory Visit: Payer: Self-pay | Admitting: "Endocrinology

## 2022-06-25 ENCOUNTER — Ambulatory Visit (HOSPITAL_COMMUNITY)
Admission: RE | Admit: 2022-06-25 | Discharge: 2022-06-25 | Disposition: A | Discharge status: Home / Self Care | Payer: Medicare Other | Source: Ambulatory Visit | Attending: Urology | Admitting: Urology

## 2022-06-25 DIAGNOSIS — N1330 Unspecified hydronephrosis: Secondary | ICD-10-CM | POA: Insufficient documentation

## 2022-06-30 ENCOUNTER — Other Ambulatory Visit: Payer: Self-pay | Admitting: Internal Medicine

## 2022-06-30 DIAGNOSIS — K219 Gastro-esophageal reflux disease without esophagitis: Secondary | ICD-10-CM

## 2022-07-29 ENCOUNTER — Ambulatory Visit (INDEPENDENT_AMBULATORY_CARE_PROVIDER_SITE_OTHER): Payer: Medicare Other | Admitting: Gastroenterology

## 2022-07-29 ENCOUNTER — Encounter: Payer: Self-pay | Admitting: Internal Medicine

## 2022-07-29 ENCOUNTER — Ambulatory Visit: Payer: Medicare Other | Admitting: Internal Medicine

## 2022-08-02 ENCOUNTER — Other Ambulatory Visit: Payer: Self-pay | Admitting: Internal Medicine

## 2022-08-02 DIAGNOSIS — K219 Gastro-esophageal reflux disease without esophagitis: Secondary | ICD-10-CM

## 2022-08-07 ENCOUNTER — Ambulatory Visit: Payer: Medicare Other | Admitting: Internal Medicine

## 2022-08-08 ENCOUNTER — Encounter: Payer: Self-pay | Admitting: Internal Medicine

## 2022-08-08 ENCOUNTER — Ambulatory Visit (INDEPENDENT_AMBULATORY_CARE_PROVIDER_SITE_OTHER): Payer: Medicare Other | Admitting: Internal Medicine

## 2022-08-08 VITALS — BP 138/86 | HR 85 | Ht 66.5 in | Wt 150.6 lb

## 2022-08-08 DIAGNOSIS — I700 Atherosclerosis of aorta: Secondary | ICD-10-CM | POA: Diagnosis not present

## 2022-08-08 DIAGNOSIS — E0590 Thyrotoxicosis, unspecified without thyrotoxic crisis or storm: Secondary | ICD-10-CM | POA: Diagnosis not present

## 2022-08-08 DIAGNOSIS — K219 Gastro-esophageal reflux disease without esophagitis: Secondary | ICD-10-CM

## 2022-08-08 DIAGNOSIS — F323 Major depressive disorder, single episode, severe with psychotic features: Secondary | ICD-10-CM | POA: Diagnosis not present

## 2022-08-08 NOTE — Assessment & Plan Note (Signed)
Has had ECT during hospitalization (2023) Continue Zyprexa, Remeron and Lexapro On lithium Followed by psychiatry - [facility]

## 2022-08-08 NOTE — Assessment & Plan Note (Signed)
Lab Results  Component Value Date   PTH 83 (H) 03/19/2022   CALCIUM 10.5 (H) 04/29/2022   PHOS 3.0 06/29/2021   Has had borderline elevated calcium in the past Can explain psychosis, nephrolithiasis and tachycardia Referred to endocrinology PTH has been downtitrating

## 2022-08-08 NOTE — Progress Notes (Addendum)
Established Patient Office Visit  Subjective:  Patient ID: Amber [name], female    DOB: 1946-02-06  Age: 77 y.o. MRN: [id number]  CC:  Chief Complaint  Patient presents with   Gastroesophageal Reflux    Follow up    HPI Amber [name] is a 77 y.o. female with past medical history of aortic atherosclerosis, depression with anxiety, PE, hyperthyroidism and protein-calorie malnutrition who presents for f/u of her chronic medical conditions.  Her daughter is present during the visit today.  Nausea/GERD: She has chronic nausea, mildly improved with omeprazole.  She has had EGD and colonoscopy, which were unremarkable overall except for small sessile polyp.  Her appetite has improved now and she has been gaining about 19 lbs since the last visit.  She denies any diarrhea, melena or hematochezia currently.  Hyperthyroidism: On methimazole, followed by Dr. Fransico Him.  Depression with anxiety: Followed by psychiatry - [facility]  She has been placed on Lexapro and lithium.  She is on Remeron and olanzapine as well.  She is still complains of anxiety/agitation and depressed mood, but has improved now.  Denies any SI or HI currently.   Her sinus tachycardia has resolved now.  She is not on metoprolol now.   Past Medical History:  Diagnosis Date   ABLA (acute blood loss anemia) 06/19/2021   Anxiety    Aortic atherosclerosis (HCC)    Aspiration pneumonia of right lung due to gastric secretions (HCC)    C. difficile colitis 07/13/2021   COVID-19 virus infection 06/08/2021    Depression    DVT of leg (deep venous thrombosis) (HCC) 05/2021   Dysphagia    Failure to thrive in adult    GERD (gastroesophageal reflux disease)    History of kidney stones    right ureter   Hypertension    Hyperthyroidism    Kidney disease    Pulmonary embolism (HCC) 05/2021   Stroke [facility])    showed up in MRI   Thoracic aortic aneurysm [facility])    Urinary incontinence     Past Surgical History:  Procedure  Laterality Date   BIOPSY  06/14/2022   Procedure: BIOPSY;  Surgeon: Dolores Frame, MD;  [facility];  Service: Gastroenterology;;   COLONOSCOPY WITH PROPOFOL N/A 06/14/2022   Procedure: COLONOSCOPY WITH PROPOFOL;  Surgeon: Dolores Frame, MD;  Location: [facility];  Service: Gastroenterology;  Laterality: N/A;  11:00am, asa 3   CYSTOSCOPY W/ URETERAL STENT PLACEMENT Right 12/12/2021   Procedure: CYSTOSCOPY WITH RETROGRADE PYELOGRAM/URETERAL STENT PLACEMENT;  Surgeon: Vanna Scotland, MD;  Location: [facility];  Service: Urology;  Laterality: Right;   CYSTOSCOPY/URETEROSCOPY/HOLMIUM LASER/STENT PLACEMENT Right 01/14/2022   Procedure: CYSTOSCOPY/URETEROSCOPY/HOLMIUM LASER/STENT EXCHANGE;  Surgeon: Vanna Scotland, MD;  Location: [facility];  Service: Urology;  Laterality: Right;   ESOPHAGOGASTRODUODENOSCOPY (EGD) WITH PROPOFOL N/A 06/14/2022   Procedure: ESOPHAGOGASTRODUODENOSCOPY (EGD) WITH PROPOFOL;  Surgeon: Dolores Frame, MD;  [facility];  Service: Gastroenterology;  Laterality: N/A;   POLYPECTOMY  06/14/2022   Procedure: POLYPECTOMY;  Surgeon: Dolores Frame, MD;  [facility];  Service: Gastroenterology;;    Family History  Problem Relation Age of Onset   High blood pressure Maternal Grandmother     Social History   Socioeconomic History   Marital status: Married    Spouse name: Fayrene Fearing   Number of children: 5   Years of education: Not on file   Highest education level: Not on file  Occupational History  Not on file  Tobacco Use   Smoking status: Former    Types: Cigarettes    Quit date: 05/27/2018    Years since quitting: 4.2   Smokeless tobacco: Never  Vaping Use   Vaping Use: Never used  Substance and Sexual Activity   Alcohol use: Not Currently   Drug use: Not Currently   Sexual activity: Not Currently  Other Topics Concern   Not on file  Social History Narrative   Right handed    one story  home    lives with family    Doesn't work currently   Social Determinants of Corporate investment banker Strain: Not on file  Food Insecurity: Not on file  Transportation Needs: Not on file  Physical Activity: Not on file  Stress: Not on file  Social Connections: Not on file  Intimate Partner Violence: Not on file    Outpatient Medications Prior to Visit  Medication Sig Dispense Refill   lithium carbonate (LITHOBID) 300 MG ER tablet Take 300 mg by mouth at bedtime.     acetaminophen (TYLENOL) 500 MG tablet Take 500 mg by mouth every 6 (six) hours as needed for moderate pain or mild pain.     docusate sodium (COLACE) 100 MG capsule Take 100 mg by mouth 2 (two) times daily.     escitalopram (LEXAPRO) 20 MG tablet Take 20 mg by mouth daily.     ferrous sulfate 325 (65 FE) MG tablet Take 1 tablet (325 mg total) by mouth daily with breakfast. 30 tablet 0   methimazole (TAPAZOLE) 5 MG tablet TAKE 1/2 TABLET BY MOUTH ONCE DAILY. 45 tablet 0   mirtazapine (REMERON) 15 MG tablet Take 1 tablet (15 mg total) by mouth at bedtime. 90 tablet 0   Multiple Vitamin (MULTIVITAMIN WITH MINERALS) TABS tablet Take 1 tablet by mouth daily. 30 tablet 0   OLANZapine (ZYPREXA) 5 MG tablet Take 5 mg by mouth in the morning and at bedtime.     omeprazole (PRILOSEC) 40 MG capsule TAKE ONE CAPSULE BY MOUTH ONCE DAILY. 30 capsule 0   ondansetron (ZOFRAN-ODT) 8 MG disintegrating tablet Take 1 tablet (8 mg total) by mouth every 12 (twelve) hours as needed for nausea or vomiting. 60 tablet 3   polyethylene glycol powder (GLYCOLAX/MIRALAX) 17 GM/SCOOP powder Take 17 g by mouth as needed for mild constipation.     No facility-administered medications prior to visit.    Allergies  Allergen Reactions   Fish Allergy     Does not eat fish   Shellfish Allergy     "I pass out"   Valium [Diazepam] Other (See Comments)    Makes her want to " fly"    ROS Review of Systems  Constitutional:  Positive for fatigue.  Negative for chills and fever.  HENT:  Negative for congestion, sinus pressure, sinus pain and sore throat.   Eyes:  Negative for pain and discharge.  Respiratory:  Negative for cough and shortness of breath.   Cardiovascular:  Negative for chest pain and palpitations.  Gastrointestinal:  Positive for constipation and nausea. Negative for abdominal pain, diarrhea and vomiting.  Endocrine: Negative for polydipsia and polyuria.  Genitourinary:  Negative for dysuria and hematuria.  Musculoskeletal:  Positive for gait problem and myalgias. Negative for neck pain and neck stiffness.  Skin:  Negative for rash.  Neurological:  Positive for tremors, weakness and numbness. Negative for dizziness.  Psychiatric/Behavioral:  Positive for agitation, behavioral problems, decreased concentration, dysphoric mood and  sleep disturbance. Negative for suicidal ideas. The patient is nervous/anxious.       Objective:    Physical Exam Vitals reviewed.  Constitutional:      General: She is not in acute distress.    Appearance: She is not diaphoretic.     Comments: Has rolling walker  HENT:     Head: Normocephalic and atraumatic.     Nose: Nose normal.     Mouth/Throat:     Mouth: Mucous membranes are moist.  Eyes:     General: No scleral icterus.    Extraocular Movements: Extraocular movements intact.  Cardiovascular:     Rate and Rhythm: Normal rate and regular rhythm.     Pulses: Normal pulses.     Heart sounds: Normal heart sounds. No murmur heard. Pulmonary:     Breath sounds: Normal breath sounds. No wheezing or rales.  Musculoskeletal:     Cervical back: Neck supple. No tenderness.     Right lower leg: No edema.     Left lower leg: No edema.  Skin:    General: Skin is warm.     Findings: No rash.  Neurological:     General: No focal deficit present.     Mental Status: She is alert and oriented to person, place, and time.     Motor: Weakness (3/5 in b/l UE and LE) present.     Gait: Gait  abnormal (Slow, unsteady gait).  Psychiatric:        Mood and Affect: Mood is anxious.        Behavior: Behavior is cooperative.     BP 138/86 (BP Location: Right Arm, Cuff Size: Normal)   Pulse 85   Ht 5' 6.5" (1.689 m)   Wt 150 lb 9.6 oz (68.3 kg)   SpO2 97%   BMI 23.94 kg/m  Wt Readings from Last 3 Encounters:  08/08/22 150 lb 9.6 oz (68.3 kg)  06/17/22 144 lb 9.6 oz (65.6 kg)  04/29/22 136 lb 11.2 oz (62 kg)    Lab Results  Component Value Date   TSH 1.870 06/07/2022   Lab Results  Component Value Date   WBC 5.7 04/29/2022   HGB 12.1 04/29/2022   HCT 37.2 04/29/2022   MCV 88.2 04/29/2022   PLT 184 04/29/2022   Lab Results  Component Value Date   NA 143 04/29/2022   K 3.9 04/29/2022   CO2 28 04/29/2022   GLUCOSE 107 (H) 04/29/2022   BUN 20 04/29/2022   CREATININE 1.06 (H) 04/29/2022   BILITOT 0.4 04/29/2022   ALKPHOS 46 12/13/2021   AST 10 04/29/2022   ALT 5 (L) 04/29/2022   PROT 6.1 04/29/2022   ALBUMIN 2.3 (L) 12/13/2021   CALCIUM 10.5 (H) 04/29/2022   ANIONGAP 6 12/17/2021   EGFR 56 (L) 03/19/2022   Lab Results  Component Value Date   CHOL 162 10/19/2021   Lab Results  Component Value Date   HDL 46 10/19/2021   Lab Results  Component Value Date   LDLCALC 97 10/19/2021   Lab Results  Component Value Date   TRIG 102 10/19/2021   Lab Results  Component Value Date   CHOLHDL 3.5 10/19/2021   Lab Results  Component Value Date   HGBA1C 4.9 10/19/2021      Assessment & Plan:   Problem List Items Addressed This Visit       Cardiovascular and Mediastinum   Aortic atherosclerosis (HCC)    Noted on CT abdomen BP WNL without  antihypertensives currently Does not like to take any medications like statin        Digestive   Gastroesophageal reflux disease without esophagitis    On Omeprazole as needed for GERD Reglan as needed for nausea, may help with delayed emptying Needs to eat at regular intervals Avoid hot and spicy  food Referred to GI for persistent nausea - had EGD, unremarkable        Endocrine   Hyperthyroidism    Has multinodular goiter Followed by Dr. Fransico Him On methimazole Her symptoms have been improving now      Relevant Orders   Basic Metabolic Panel (BMET)     Other   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HCC) - Primary    Has had ECT during hospitalization (2023) Continue Zyprexa, Remeron and Lexapro On lithium Followed by psychiatry - [facility]      Hypercalcemia    Lab Results  Component Value Date   PTH 83 (H) 03/19/2022   CALCIUM 10.5 (H) 04/29/2022   PHOS 3.0 06/29/2021  Has had borderline elevated calcium in the past Can explain psychosis, nephrolithiasis and tachycardia Referred to endocrinology PTH has been downtitrating      Relevant Orders   Basic Metabolic Panel (BMET)   Parathyroid hormone, intact (no Ca)    No orders of the defined types were placed in this encounter.   Follow-up: Return in about 6 months (around 02/08/2023).    [name], MD

## 2022-08-08 NOTE — Patient Instructions (Signed)
Please continue taking medications as prescribed.  Please take Omeprazole only as needed for acid reflux or stomach pain.  Please maintain at least 3 meals in a day and maintain at least 64 ounces of fluid in a day.

## 2022-08-08 NOTE — Assessment & Plan Note (Signed)
Noted on CT abdomen BP WNL without antihypertensives currently Does not like to take any medications like statin

## 2022-08-08 NOTE — Assessment & Plan Note (Signed)
Has multinodular goiter Followed by Dr. Nida On methimazole Her symptoms have been improving now 

## 2022-08-08 NOTE — Assessment & Plan Note (Signed)
On Omeprazole as needed for GERD Reglan as needed for nausea, may help with delayed emptying Needs to eat at regular intervals Avoid hot and spicy food Referred to GI for persistent nausea - had EGD, unremarkable

## 2022-08-27 ENCOUNTER — Encounter: Payer: Self-pay | Admitting: Internal Medicine

## 2022-08-30 ENCOUNTER — Telehealth: Payer: Self-pay | Admitting: Internal Medicine

## 2022-08-30 NOTE — Telephone Encounter (Signed)
Daughter called in on patient behalf. Patient is experiencing UTI symptoms again. Wants to see if can get antibiotic sent in.  Daughter wants a call back or message on mychart

## 2022-09-02 ENCOUNTER — Telehealth (INDEPENDENT_AMBULATORY_CARE_PROVIDER_SITE_OTHER): Payer: Medicare Other | Admitting: Internal Medicine

## 2022-09-02 ENCOUNTER — Encounter: Payer: Self-pay | Admitting: Internal Medicine

## 2022-09-02 DIAGNOSIS — R8290 Unspecified abnormal findings in urine: Secondary | ICD-10-CM | POA: Diagnosis not present

## 2022-09-02 LAB — POCT URINALYSIS DIP (CLINITEK)
Bilirubin, UA: NEGATIVE
Glucose, UA: NEGATIVE mg/dL
Ketones, POC UA: NEGATIVE mg/dL
Leukocytes, UA: NEGATIVE
Nitrite, UA: NEGATIVE
POC PROTEIN,UA: NEGATIVE
Spec Grav, UA: 1.02 (ref 1.010–1.025)
Urobilinogen, UA: 0.2 E.U./dL
pH, UA: 7 (ref 5.0–8.0)

## 2022-09-02 NOTE — Progress Notes (Signed)
Virtual Visit via Video Note   Because of Amber [name]'s co-morbid illnesses, she is at least at moderate risk for complications without adequate follow up.  This format is felt to be most appropriate for this patient at this time.  All issues noted in this document were discussed and addressed.  A limited physical exam was performed with this format.      Evaluation Performed:  Follow-up visit  Date:  09/02/2022   ID:  Amber, [name] December 25, 1945, MRN [id number]  Patient Location: Home Provider Location: Office/Clinic  Participants: Patient Location of Patient: Home Location of Provider: Telehealth Consent was obtain for visit to be over via telehealth. I verified that I am speaking with the correct person using two identifiers.  PCP:  Amber Halon, MD   Chief Complaint: Foul-smelling urine  History of Present Illness:    Amber [name] is a 77 y.o. female who has a video visit for complaint of foul-smelling urine for the last 3 days.  She also has intermittent lower abdominal pain and nausea, which are chronic.  Her daughter has provided history today as she has h/o psychotic depression and dementia.  She denies any fever, chills, dysuria, hematuria or vomiting.  The patient does not have symptoms concerning for COVID-19 infection (fever, chills, cough, or new shortness of breath).   Past Medical, Surgical, Social History, Allergies, and Medications have been Reviewed.  Past Medical History:  Diagnosis Date   ABLA (acute blood loss anemia) 06/19/2021   Anxiety    Aortic atherosclerosis (HCC)    Aspiration pneumonia of right lung due to gastric secretions (HCC)    C. difficile colitis 07/13/2021   COVID-19 virus infection 06/08/2021    Depression    DVT of leg (deep venous thrombosis) (HCC) 05/2021   Dysphagia    Failure to thrive in adult    GERD (gastroesophageal reflux disease)    History of kidney stones    right ureter   Hypertension    Hyperthyroidism     Kidney disease    Pulmonary embolism (HCC) 05/2021   Stroke [facility])    showed up in MRI   Thoracic aortic aneurysm [facility])    Urinary incontinence    Past Surgical History:  Procedure Laterality Date   BIOPSY  06/14/2022   Procedure: BIOPSY;  Surgeon: Dolores Frame, MD;  [facility];  Service: Gastroenterology;;   COLONOSCOPY WITH PROPOFOL N/A 06/14/2022   Procedure: COLONOSCOPY WITH PROPOFOL;  Surgeon: Dolores Frame, MD;  [facility];  Service: Gastroenterology;  Laterality: N/A;  11:00am, asa 3   CYSTOSCOPY W/ URETERAL STENT PLACEMENT Right 12/12/2021   Procedure: CYSTOSCOPY WITH RETROGRADE PYELOGRAM/URETERAL STENT PLACEMENT;  Surgeon: Vanna Scotland, MD;  Location: [facility];  Service: Urology;  Laterality: Right;   CYSTOSCOPY/URETEROSCOPY/HOLMIUM LASER/STENT PLACEMENT Right 01/14/2022   Procedure: CYSTOSCOPY/URETEROSCOPY/HOLMIUM LASER/STENT EXCHANGE;  Surgeon: Vanna Scotland, MD;  Location: [facility];  Service: Urology;  Laterality: Right;   ESOPHAGOGASTRODUODENOSCOPY (EGD) WITH PROPOFOL N/A 06/14/2022   Procedure: ESOPHAGOGASTRODUODENOSCOPY (EGD) WITH PROPOFOL;  Surgeon: Dolores Frame, MD;  [facility];  Service: Gastroenterology;  Laterality: N/A;   POLYPECTOMY  06/14/2022   Procedure: POLYPECTOMY;  Surgeon: Marguerita Merles, Reuel Boom, MD;  [facility];  Service: Gastroenterology;;     No outpatient medications have been marked as taking for the 09/02/22 encounter (Video Visit) with Amber Halon, MD.     Allergies:   Fish allergy, Shellfish allergy,  and Valium [diazepam]   ROS:   Please see the history of present illness.     All other systems reviewed and are negative.   Labs/Other Tests and Data Reviewed:    Recent Labs: 12/12/2021: Magnesium 1.9 04/29/2022: ALT 5; BUN 20; Creat 1.06; Hemoglobin 12.1; Platelets 184; Potassium 3.9; Sodium 143 06/07/2022: TSH 1.870   Recent Lipid Panel Lab  Results  Component Value Date/Time   CHOL 162 10/19/2021 08:18 AM   TRIG 102 10/19/2021 08:18 AM   HDL 46 10/19/2021 08:18 AM   CHOLHDL 3.5 10/19/2021 08:18 AM   LDLCALC 97 10/19/2021 08:18 AM    Wt Readings from Last 3 Encounters:  08/08/22 150 lb 9.6 oz (68.3 kg)  06/17/22 144 lb 9.6 oz (65.6 kg)  04/29/22 136 lb 11.2 oz (62 kg)     Objective:    Vital Signs:  There were no vitals taken for this visit.   VITAL SIGNS:  reviewed GEN:  no acute distress EYES:  sclerae anicteric, EOMI - Extraocular Movements Intact NEURO:  alert and oriented x 3, no obvious focal deficit PSYCH:  normal affect  ASSESSMENT & PLAN:    Foul-smelling urine UA reviewed, check urine culture considering her symptoms She has had complicated UTI in the past Considering she has only foul-smelling urine and not dysuria or other systemic effects, would wait for urine culture Advised to maintain adequate hydration as she has history of poor p.o. intake   I discussed the assessment and treatment plan with the patient. The patient was provided an opportunity to ask questions, and all were answered. The patient agreed with the plan and demonstrated an understanding of the instructions.   The patient was advised to call back or seek an in-person evaluation if the symptoms worsen or if the condition fails to improve as anticipated.  The above assessment and management plan was discussed with the patient. The patient verbalized understanding of and has agreed to the management plan.   Medication Adjustments/Labs and Tests Ordered: Current medicines are reviewed at length with the patient today.  Concerns regarding medicines are outlined above.   Tests Ordered: Orders Placed This Encounter  Procedures   Urine Culture   POCT URINALYSIS DIP (CLINITEK)    Medication Changes: No orders of the defined types were placed in this encounter.    Note: This dictation was prepared with Dragon dictation along with  smaller phrase technology. Similar sounding words can be transcribed inadequately or may not be corrected upon review. Any transcriptional errors that result from this process are unintentional.      Disposition:  Follow up  Signed, Amber Halon, MD  09/02/2022 4:54 PM     [facility]

## 2022-09-02 NOTE — Telephone Encounter (Signed)
Daughter to bring in urine today

## 2022-09-02 NOTE — Patient Instructions (Addendum)
Please maintain at least 64 ounces of fluid intake.  We will contact if antibiotic treatment is warranted based on urine culture.

## 2022-09-04 LAB — URINE CULTURE

## 2022-09-26 ENCOUNTER — Other Ambulatory Visit: Payer: Self-pay | Admitting: "Endocrinology

## 2022-09-30 ENCOUNTER — Encounter (HOSPITAL_COMMUNITY): Payer: Self-pay

## 2022-10-18 LAB — PHOSPHORUS: Phosphorus: 2.5 mg/dL — ABNORMAL LOW (ref 3.0–4.3)

## 2022-10-18 LAB — PTH, INTACT AND CALCIUM
Calcium: 10.3 mg/dL (ref 8.7–10.3)
PTH: 111 pg/mL — ABNORMAL HIGH (ref 15–65)

## 2022-10-18 LAB — T4, FREE: Free T4: 1.26 ng/dL (ref 0.82–1.77)

## 2022-10-18 LAB — TSH: TSH: 0.862 u[IU]/mL (ref 0.450–4.500)

## 2022-10-18 LAB — MAGNESIUM: Magnesium: 2 mg/dL (ref 1.6–2.3)

## 2022-10-18 LAB — T3, FREE: T3, Free: 2.4 pg/mL (ref 2.0–4.4)

## 2022-10-22 ENCOUNTER — Encounter: Payer: Self-pay | Admitting: "Endocrinology

## 2022-10-22 ENCOUNTER — Ambulatory Visit (INDEPENDENT_AMBULATORY_CARE_PROVIDER_SITE_OTHER): Payer: Medicare Other | Admitting: "Endocrinology

## 2022-10-22 VITALS — BP 138/78 | HR 60 | Ht 66.5 in | Wt 152.4 lb

## 2022-10-22 DIAGNOSIS — Z13820 Encounter for screening for osteoporosis: Secondary | ICD-10-CM

## 2022-10-22 DIAGNOSIS — E212 Other hyperparathyroidism: Secondary | ICD-10-CM | POA: Diagnosis not present

## 2022-10-22 DIAGNOSIS — M859 Disorder of bone density and structure, unspecified: Secondary | ICD-10-CM | POA: Insufficient documentation

## 2022-10-22 DIAGNOSIS — E0590 Thyrotoxicosis, unspecified without thyrotoxic crisis or storm: Secondary | ICD-10-CM

## 2022-10-22 NOTE — Progress Notes (Signed)
10/22/2022    Endocrinology follow-up note   Subjective:    Patient ID: Amber [name], female    DOB: [birth year], PCP [name], MD.   Past Medical History:  Diagnosis Date   ABLA (acute blood loss anemia) 06/19/2021   Anxiety    Aortic atherosclerosis (HCC)    Aspiration pneumonia of right lung due to gastric secretions (HCC)    C. difficile colitis 07/13/2021   COVID-19 virus infection 06/08/2021    Depression    DVT of leg (deep venous thrombosis) (HCC) 05/2021   Dysphagia    Failure to thrive in adult    GERD (gastroesophageal reflux disease)    History of kidney stones    right ureter   Hypertension    Hyperthyroidism    Kidney disease    Pulmonary embolism (HCC) 05/2021   Stroke [facility])    showed up in MRI   Thoracic aortic aneurysm [facility])    Urinary incontinence     Past Surgical History:  Procedure Laterality Date   BIOPSY  06/14/2022   Procedure: BIOPSY;  Surgeon: Dolores Frame, MD;  [facility];  Service: Gastroenterology;;   COLONOSCOPY WITH PROPOFOL N/A 06/14/2022   Procedure: COLONOSCOPY WITH PROPOFOL;  Surgeon: Dolores Frame, MD;  [facility];  Service: Gastroenterology;  Laterality: N/A;  11:00am, asa 3   CYSTOSCOPY W/ URETERAL STENT PLACEMENT Right 12/12/2021   Procedure: CYSTOSCOPY WITH RETROGRADE PYELOGRAM/URETERAL STENT PLACEMENT;  Surgeon: Vanna Scotland, MD;  Location: [facility];  Service: Urology;  Laterality: Right;   CYSTOSCOPY/URETEROSCOPY/HOLMIUM LASER/STENT PLACEMENT Right 01/14/2022   Procedure: CYSTOSCOPY/URETEROSCOPY/HOLMIUM LASER/STENT EXCHANGE;  Surgeon: Vanna Scotland, MD;  Location: [facility];  Service: Urology;  Laterality: Right;   ESOPHAGOGASTRODUODENOSCOPY (EGD) WITH PROPOFOL N/A 06/14/2022   Procedure: ESOPHAGOGASTRODUODENOSCOPY (EGD) WITH PROPOFOL;  Surgeon: Dolores Frame, MD;  [facility];  Service: Gastroenterology;  Laterality: N/A;   POLYPECTOMY   06/14/2022   Procedure: POLYPECTOMY;  Surgeon: Marguerita Merles, Reuel Boom, MD;  [facility];  Service: Gastroenterology;;    Social History   Socioeconomic History   Marital status: Married    Spouse name: Fayrene Fearing   Number of children: 5   Years of education: Not on file   Highest education level: Not on file  Occupational History   Not on file  Tobacco Use   Smoking status: Former    Types: Cigarettes    Quit date: 05/27/2018    Years since quitting: 4.4   Smokeless tobacco: Never  Vaping Use   Vaping Use: Never used  Substance and Sexual Activity   Alcohol use: Not Currently   Drug use: Not Currently   Sexual activity: Not Currently  Other Topics Concern   Not on file  Social History Narrative   Right handed    one story home    lives with family    Doesn't work currently   Social Determinants of Corporate investment banker Strain: Not on file  Food Insecurity: Not on file  Transportation Needs: Not on file  Physical Activity: Not on file  Stress: Not on file  Social Connections: Not on file    Family History  Problem Relation Age of Onset   High blood pressure Maternal Grandmother     Outpatient Encounter Medications as of 10/22/2022  Medication Sig   ARIPiprazole (ABILIFY) 5 MG tablet Take 5 mg by mouth daily.   acetaminophen (TYLENOL) 500 MG tablet Take 500 mg by  mouth every 6 (six) hours as needed for moderate pain or mild pain.   docusate sodium (COLACE) 100 MG capsule Take 100 mg by mouth 2 (two) times daily as needed.   escitalopram (LEXAPRO) 20 MG tablet Take 20 mg by mouth daily.   ferrous sulfate 325 (65 FE) MG tablet Take 1 tablet (325 mg total) by mouth daily with breakfast.   methimazole (TAPAZOLE) 5 MG tablet TAKE 1/2 TABLET BY MOUTH ONCE DAILY.   mirtazapine (REMERON) 15 MG tablet Take 1 tablet (15 mg total) by mouth at bedtime.   Multiple Vitamin (MULTIVITAMIN WITH MINERALS) TABS tablet Take 1 tablet by mouth daily.   omeprazole  (PRILOSEC) 40 MG capsule TAKE ONE CAPSULE BY MOUTH ONCE DAILY. (Patient taking differently: Take 40 mg by mouth daily as needed.)   ondansetron (ZOFRAN-ODT) 8 MG disintegrating tablet Take 1 tablet (8 mg total) by mouth every 12 (twelve) hours as needed for nausea or vomiting.   polyethylene glycol powder (GLYCOLAX/MIRALAX) 17 GM/SCOOP powder Take 17 g by mouth as needed for mild constipation.   [DISCONTINUED] lithium carbonate (LITHOBID) 300 MG ER tablet Take 300 mg by mouth at bedtime.   [DISCONTINUED] OLANZapine (ZYPREXA) 5 MG tablet Take 5 mg by mouth in the morning and at bedtime.   No facility-administered encounter medications on file as of 10/22/2022.    ALLERGIES: Allergies  Allergen Reactions   Fish Allergy     Does not eat fish   Shellfish Allergy     "I pass out"   Valium [Diazepam] Other (See Comments)    Makes her want to " fly"    VACCINATION STATUS:  There is no immunization history on file for this patient.   HPI  Amber [name] is 77 y.o. female who presents today with a medical history as above. she is being seen in follow-up after she was seen in consultation for hyperthyroidism requested by [name], MD.    After thyroid uptake and scan showed cold nodule in the left lobe of her thyroid, she was considered for dedicated thyroid ultrasound which confirms 2.8 cm suspicious nodule corresponding to the area of cold nodule on the uptake and scan. She underwent  FNA biopsy of this nodule with inconclusive cytology which needed molecular study. Her afirma results are benign. she has been dealing with symptoms of anxiety, weight loss, sleep disturbance for several weeks. She was started on low dose methimazole for mild hyperthyroidism.  She is currently on methimazole 2.5 mg p.o. daily with continued clinical response.   She returns with improved thyroid function tests. she denies dysphagia, choking, shortness of breath, no recent voice change.   She c/o nausea ,  scheduled to see GI. she denies family history of thyroid dysfunction nor thyroid malignancy. she denies personal history of goiter. she is not on any anti-thyroid medications nor on any thyroid hormone supplements. she  is willing to proceed with appropriate work up and therapy for thyrotoxicosis. She is accompanied by her daughter. During her prior labs she was found to have mild hypercalcemia, repeat labs show high normal calcium associated with high PTH of 111.  She denies any prior history of parathyroid dysfunction.                           Review of systems  Constitutional: + Steady weight gain, + fatigue, + subjective hyperthermia Eyes: no blurry vision, +xerophthalmia ENT: no sore throat, no nodules palpated in throat,  no dysphagia/odynophagia, nor hoarseness    Objective:    BP 138/78   Pulse 60   Ht 5' 6.5" (1.689 m)   Wt 152 lb 6.4 oz (69.1 kg)   BMI 24.23 kg/m   Wt Readings from Last 3 Encounters:  10/22/22 152 lb 6.4 oz (69.1 kg)  08/08/22 150 lb 9.6 oz (68.3 kg)  06/17/22 144 lb 9.6 oz (65.6 kg)                                                Physical exam  Constitutional: Body mass index is 24.23 kg/m., not in acute distress, + anxious state of mind Eyes: PERRLA, EOMI,  exophthalmos    CMP     Component Value Date/Time   NA 143 04/29/2022 1143   NA 143 03/19/2022 1108   K 3.9 04/29/2022 1143   CL 109 04/29/2022 1143   CO2 28 04/29/2022 1143   GLUCOSE 107 (H) 04/29/2022 1143   BUN 20 04/29/2022 1143   BUN 15 03/19/2022 1108   CREATININE 1.06 (H) 04/29/2022 1143   CALCIUM 10.3 10/17/2022 1454   PROT 6.1 04/29/2022 1143   PROT 6.5 05/04/2021 1531   ALBUMIN 2.3 (L) 12/13/2021 0512   ALBUMIN 3.4 (L) 05/04/2021 1531   AST 10 04/29/2022 1143   AST 9 (L) 10/23/2021 1411   ALT 5 (L) 04/29/2022 1143   ALT 6 10/23/2021 1411   ALKPHOS 46 12/13/2021 0512   BILITOT 0.4 04/29/2022 1143   BILITOT 0.4 10/23/2021 1411   GFRNONAA >60 12/17/2021 0555    GFRNONAA 46 (L) 10/23/2021 1411     CBC    Component Value Date/Time   WBC 5.7 04/29/2022 1143   RBC 4.22 04/29/2022 1143   HGB 12.1 04/29/2022 1143   HGB 11.4 03/19/2022 1108   HCT 37.2 04/29/2022 1143   HCT 34.8 03/19/2022 1108   PLT 184 04/29/2022 1143   PLT 200 03/19/2022 1108   MCV 88.2 04/29/2022 1143   MCV 89 03/19/2022 1108   MCH 28.7 04/29/2022 1143   MCHC 32.5 04/29/2022 1143   RDW 13.4 04/29/2022 1143   RDW 14.2 03/19/2022 1108   LYMPHSABS 1,585 04/29/2022 1143   LYMPHSABS 2.1 03/19/2022 1108   MONOABS 0.5 12/12/2021 1515   EOSABS 29 04/29/2022 1143   EOSABS 0.1 03/19/2022 1108   BASOSABS 29 04/29/2022 1143   BASOSABS 0.0 03/19/2022 1108     Lab Results  Component Value Date   TSH 0.862 10/17/2022   TSH 1.870 06/07/2022   TSH 1.670 02/06/2022   TSH 0.060 (L) 12/12/2021   TSH 0.022 (L) 08/30/2021   TSH <0.080 (L) 08/10/2021   TSH 0.545 06/09/2021   TSH 0.559 06/08/2021   TSH 1.560 05/04/2021   TSH 0.152 (L) 03/29/2021   FREET4 1.26 10/17/2022   FREET4 0.98 06/07/2022   FREET4 0.99 02/06/2022   FREET4 1.16 (H) 12/12/2021   FREET4 1.43 08/30/2021     Thyroid uptake and scan on 09/11/21 4 hour I-123 uptake = 2.9% (normal 5-20%),  24 hour I-123 uptake = 11.9% (normal 10-30%)   IMPRESSION: 1. Decreased iodine uptake at 4 hours, with low normal uptake at 24 hours. 2. Diffuse heterogeneity of the thyroid parenchyma, with equivocal findings for cold nodule corresponding to the mid left lobe thyroid lesion seen on prior CT. Correlation with ultrasound is recommended to  help guide possible sampling.   FNA- AUS Afirma - Benign  Assessment & Plan:   1. Hyperthyroidism 2.  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 3.  Hypercalcemia: She was recently diagnosed with mild hypercalcemia  I reviewed her labs and thyroid scan with her and her daughter.  She is responding to low-dose methimazole.  She is  advised to continue methimazole 2.5 mg p.o. daily at breakfast with plan to repeat thyroid function tests prior to her next visit.  She has had benign results of fine-needle aspiration and molecular study to 2.8 cm nodule in the left lobe of her thyroid.   She is already on Metoprolol, no need for additional Beta blocker. Her hypercalcemia is a new observation, returns with slight improvement in her hypercalcemia, however she has high PTH of 111 on a background of mild CKD.  Not clear whether this is primary hyperparathyroidism or CKD effect.   -She will not need active intervention, however will need repeat labs.  She will also need bone density during her annual physical exam.  She is advised to wear a compression LE sleeve to help with peripheral dependent edema. -Patient is advised to maintain close follow up with [name], MD for primary care needs.   I spent  22  minutes in the care of the patient today including review of labs from Thyroid Function, CMP, and other relevant labs ; imaging/biopsy records (current and previous including abstractions from other facilities); face-to-face time discussing  her lab results and symptoms, medications doses, her options of short and long term treatment based on the latest standards of care / guidelines;   and documenting the encounter.  Amber [name]  participated in the discussions, expressed understanding, and voiced agreement with the above plans.  All questions were answered to her satisfaction. she is encouraged to contact clinic should she have any questions or concerns prior to her return visit.   Follow up plan: Return in about 3 months (around 01/22/2023) for F/U with Pre-visit Labs, DXA Scan B4 NV.   Thank you for involving me in the care of this pleasant patient, and I will continue to update you with her progress.  Marquis Lunch, MD [facility] Phone: [phone]  Fax:  [phone]   10/22/2022, 5:55 PM  This note was partially dictated with voice recognition software. Similar sounding words can be transcribed inadequately or may not  be corrected upon review.

## 2022-10-31 ENCOUNTER — Ambulatory Visit (INDEPENDENT_AMBULATORY_CARE_PROVIDER_SITE_OTHER): Payer: Medicare Other | Admitting: Gastroenterology

## 2022-11-25 ENCOUNTER — Ambulatory Visit (INDEPENDENT_AMBULATORY_CARE_PROVIDER_SITE_OTHER): Payer: Medicare Other | Admitting: Gastroenterology

## 2022-11-25 ENCOUNTER — Encounter (INDEPENDENT_AMBULATORY_CARE_PROVIDER_SITE_OTHER): Payer: Self-pay | Admitting: Gastroenterology

## 2022-11-25 VITALS — BP 174/98 | Temp 97.9°F | Ht 66.5 in | Wt 154.7 lb

## 2022-11-25 DIAGNOSIS — K5904 Chronic idiopathic constipation: Secondary | ICD-10-CM | POA: Diagnosis not present

## 2022-11-25 DIAGNOSIS — R110 Nausea: Secondary | ICD-10-CM | POA: Diagnosis not present

## 2022-11-25 DIAGNOSIS — R198 Other specified symptoms and signs involving the digestive system and abdomen: Secondary | ICD-10-CM

## 2022-11-25 NOTE — Progress Notes (Addendum)
Referring Provider: [name], MD Primary Care Physician:  [name], MD Primary GI Physician: Levon Hedger   Chief Complaint  Patient presents with   Nausea    Patient arrives with daughter [name]. Follow up on nausea. Has nausea every day. Has zofran but does not take due it not helping.    rectal pressure    Follow up on rectal pressure. States it always feels like she has to have a BM. Declines to use stool softners or miralax.    HPI:   Amber [name] is a 77 y.o. female with past medical history of  aortic atherosclerosis, depression with anxiety, PE, hyperthyroidism , h/o stroke, PE   Patient presenting today for follow up of nausea and rectal pressure   Last seen December 2023, at that time having recurrent daily nausea x6 months. No vomiting. Taking reglan 5mg  TID PRN which helps a little. Also with intermittent rectal pressure and lower abdominal pain.   Recommended to schedule EGD/Colonoscopy, CBC, CMP, Celiac panel, start zofran q12h, discuss switching lexapro to different agent.  Celiac negative, PTH elevated-question hyperparathyroidism influencing her nausea?  Present:  She continues to have nausea, no vomiting. States zofran does not help so she does not take it. Notes she is always hungry. She does not feel eating makes her nausea worse.  She has lower abdominal pain and rectal pressure. Daughter reports she eats a lot of non healthy food, does not eat a lot of fiber. Wont take miralax or her stool softener. Daughter reports she was put on lithium which caused her to have diarrhea, they eventually transitioned her off of the medicine as diarrhea did not let up. Her daughter puts stool softeners in her pill pod but states patient does not take them. Patient notes she is fearful of not having anyone with her and needing to get to the restroom urgently but not being able to though daughter states she has not had fecal incontinence and even wears a brief to be  safe.  She is unsure how often she is having a BM. Patient states maybe once per month but her daughter feels it is more often than that though does note hard stool balls at times. She drinks sprite, not much water.    CT December 12, 2021 for evaluation of abdominal pain  IMPRESSION: 1. Two obstructing ureteral calculi in the right UPJ and proximal right ureter, with reverse S shaped configuration of the proximal right ureter. Resultant severe right hydronephrosis and right perinephric stranding. 2. 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 3. Enlarged heart. 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   Last Colonoscopy:05/2022 - Hemorrhoids found on perianal exam.  - One 4 mm polyp in the transverse colon (TA) - Diverticulosis in the sigmoid colon, in the descending colon and in the ascending colon. - The distal rectum and anal verge are normal on retroflexion view.  Last Endoscopy:05/2022 - Normal esophagus.                           - Normal stomach. Biopsied-normal                            - Duodenal lipoma.  Recommendations:  No need to repeat TCS given age   Past Medical History:  Diagnosis Date   ABLA (  acute blood loss anemia) 06/19/2021   Anxiety    Aortic atherosclerosis (HCC)    Aspiration pneumonia of right lung due to gastric secretions (HCC)    C. difficile colitis 07/13/2021   COVID-19 virus infection 06/08/2021    Depression    DVT of leg (deep venous thrombosis) (HCC) 05/2021   Dysphagia    Failure to thrive in adult    GERD (gastroesophageal reflux disease)    History of kidney stones    right ureter   Hypertension    Hyperthyroidism    Kidney disease    Pulmonary embolism (HCC) 05/2021   Stroke [facility])    showed up in MRI   Thoracic aortic aneurysm [facility])    Urinary incontinence     Past Surgical History:  Procedure Laterality Date    BIOPSY  06/14/2022   Procedure: BIOPSY;  Surgeon: Dolores Frame, MD;  [facility];  Service: Gastroenterology;;   COLONOSCOPY WITH PROPOFOL N/A 06/14/2022   Procedure: COLONOSCOPY WITH PROPOFOL;  Surgeon: Dolores Frame, MD;  [facility];  Service: Gastroenterology;  Laterality: N/A;  11:00am, asa 3   CYSTOSCOPY W/ URETERAL STENT PLACEMENT Right 12/12/2021   Procedure: CYSTOSCOPY WITH RETROGRADE PYELOGRAM/URETERAL STENT PLACEMENT;  Surgeon: Vanna Scotland, MD;  Location: [facility];  Service: Urology;  Laterality: Right;   CYSTOSCOPY/URETEROSCOPY/HOLMIUM LASER/STENT PLACEMENT Right 01/14/2022   Procedure: CYSTOSCOPY/URETEROSCOPY/HOLMIUM LASER/STENT EXCHANGE;  Surgeon: Vanna Scotland, MD;  Location: [facility];  Service: Urology;  Laterality: Right;   ESOPHAGOGASTRODUODENOSCOPY (EGD) WITH PROPOFOL N/A 06/14/2022   Procedure: ESOPHAGOGASTRODUODENOSCOPY (EGD) WITH PROPOFOL;  Surgeon: Dolores Frame, MD;  [facility];  Service: Gastroenterology;  Laterality: N/A;   POLYPECTOMY  06/14/2022   Procedure: POLYPECTOMY;  Surgeon: Marguerita Merles, Reuel Boom, MD;  Location: [facility];  Service: Gastroenterology;;    Current Outpatient Medications  Medication Sig Dispense Refill   acetaminophen (TYLENOL) 500 MG tablet Take 500 mg by mouth every 6 (six) hours as needed for moderate pain or mild pain.     ARIPiprazole (ABILIFY) 5 MG tablet Take 5 mg by mouth daily.     escitalopram (LEXAPRO) 20 MG tablet Take 20 mg by mouth daily.     ferrous sulfate 325 (65 FE) MG tablet Take 1 tablet (325 mg total) by mouth daily with breakfast. 30 tablet 0   methimazole (TAPAZOLE) 5 MG tablet TAKE 1/2 TABLET BY MOUTH ONCE DAILY. 45 tablet 0   mirtazapine (REMERON) 15 MG tablet Take 1 tablet (15 mg total) by mouth at bedtime. 90 tablet 0   Multiple Vitamin (MULTIVITAMIN WITH MINERALS) TABS tablet Take 1 tablet by mouth daily. 30 tablet 0   omeprazole  (PRILOSEC) 40 MG capsule TAKE ONE CAPSULE BY MOUTH ONCE DAILY. (Patient taking differently: Take 40 mg by mouth daily as needed.) 30 capsule 0   ondansetron (ZOFRAN-ODT) 8 MG disintegrating tablet Take 1 tablet (8 mg total) by mouth every 12 (twelve) hours as needed for nausea or vomiting. 60 tablet 3   polyethylene glycol powder (GLYCOLAX/MIRALAX) 17 GM/SCOOP powder Take 17 g by mouth as needed for mild constipation.     No current facility-administered medications for this visit.    Allergies as of 11/25/2022 - Review Complete 11/25/2022  Allergen Reaction Noted   Fish allergy  12/12/2021   Shellfish allergy  03/05/2021   Valium [diazepam] Other (See Comments) 09/25/2021    Family History  Problem Relation Age of Onset   High blood pressure Maternal Grandmother  Social History   Socioeconomic History   Marital status: Married    Spouse name: Fayrene Fearing   Number of children: 5   Years of education: Not on file   Highest education level: Not on file  Occupational History   Not on file  Tobacco Use   Smoking status: Former    Types: Cigarettes    Quit date: 05/27/2018    Years since quitting: 4.5   Smokeless tobacco: Never  Vaping Use   Vaping Use: Never used  Substance and Sexual Activity   Alcohol use: Not Currently   Drug use: Not Currently   Sexual activity: Not Currently  Other Topics Concern   Not on file  Social History Narrative   Right handed    one story home    lives with family    Doesn't work currently   Social Determinants of Corporate investment banker Strain: Not on file  Food Insecurity: Not on file  Transportation Needs: Not on file  Physical Activity: Not on file  Stress: Not on file  Social Connections: Not on file    Review of systems General: negative for malaise, night sweats, fever, chills, weight loss Neck: Negative for lumps, goiter, pain and significant neck swelling Resp: Negative for cough, wheezing, dyspnea at rest CV: Negative  for chest pain, leg swelling, palpitations, orthopnea GI: denies melena, hematochezia, vomiting, diarrhea, dysphagia, odyonophagia, early satiety or unintentional weight loss. +constipation +nausea +lower abdominal pain MSK: Negative for joint pain or swelling, back pain, and muscle pain. Derm: Negative for itching or rash Psych: Denies depression, anxiety, memory loss, confusion. No homicidal or suicidal ideation.  Heme: Negative for prolonged bleeding, bruising easily, and swollen nodes. Endocrine: Negative for cold or heat intolerance, polyuria, polydipsia and goiter. Neuro: negative for tremor, gait imbalance, syncope and seizures. The remainder of the review of systems is noncontributory.  Physical Exam: There were no vitals taken for this visit. General:   Alert and oriented. No distress noted. Pleasant and cooperative.  Head:  Normocephalic and atraumatic. Eyes:  Conjuctiva clear without scleral icterus. Mouth:  Oral mucosa pink and moist. Good dentition. No lesions. Heart: Normal rate and rhythm, s1 and s2 heart sounds present.  Lungs: Clear lung sounds in all lobes. Respirations equal and unlabored. Abdomen:  +BS, soft, non-tender and non-distended. No rebound or guarding. No HSM or masses noted. Derm: No palmar erythema or jaundice Msk:  Symmetrical without gross deformities. Normal posture. Extremities:  Without edema. Neurologic:  Alert and  oriented x4 Psych:  Alert and cooperative. Normal mood and affect.  Invalid input(s): "6 MONTHS"   ASSESSMENT: Amber [name] is a 77 y.o. female presenting today for ongoing nausea and constipation  Constipation: having some rectal pressure as well. recent Colonoscopy as above. Having a BM maybe a few times per month.  Patient's daughter reports patient declines to take MiraLAX and usually will not take her stool softener that is in her pill plan.  After discussion with patient she tells me she is fearful of having fecal urgency and  possible incontinence if she takes something for constipation.  Her water intake and intake of fruits veggies is low.  I had a thorough discussion with the patient and acknowledged her concerns regarding fecal urgency/incontinence. We discussed importance of having regular bowel movements and being on a bowel regimen to help with this.  I discussed that we can start conservatively with 1 stool softener each evening, increasing water intake and increasing fruits, veggies, whole  grains.  If after 10 to 14 days she is not having at least 2-3 bowel movements per week with easier passage of her stool, would recommend starting MiraLAX.  Nausea: recent EGD as above. she denies vomiting. Zofran does not provide much relief. She is hungry and daughter notes appetite is good. Query if nausea is secondary to her significant constipation.  Would recommend good bowel regimen and try to get constipation under control, if her nausea persist despite constipation improving, will consider further evaluation with gastric emptying study.   PLAN:  Increase water intake, aim for atleast 64 oz per day Increase fruits, veggies and whole grains, kiwi and prunes are especially good for constipation 3. Stool softener nightly x10-14 days 4. If bowel frequency not improving, Start taking Miralax 1 capful every day for one week. If bowel movements do not improve, increase to 1 capful every 12 hours. If after two weeks there is no improvement, increase to 1 capful every 8 hours 5. Consider GES if nausea persists  All questions were answered, patient verbalized understanding and is in agreement with plan as outlined above.    Follow Up: 3 months   Eddie Payette L. Jeanmarie Hubert, MSN, APRN, AGNP-C Adult-Gerontology Nurse Practitioner [facility]  I have reviewed the note and agree with the APP's assessment as described in this progress note  Katrinka Blazing, MD Gastroenterology and Hepatology [facility]

## 2022-11-25 NOTE — Patient Instructions (Signed)
As discussed, I suspect much of your symptoms are coming from not moving your bowels adequately Increase water intake, aim for atleast 64 oz per day Increase fruits, veggies and whole grains, kiwi and prunes are especially good for constipation You can try doing stool softener each evening x10-14 days, if bowel movements are not improving (aim for 2-3 BMs per week with softer/easier to pass stools), you can Start taking Miralax 1 capful every day for one week. If bowel movements do not improve, increase to 1 capful every 12 hours. If after two weeks there is no improvement, increase to 1 capful every 8 hours  If constipation improves but you continue to have nausea, please let me know as we may need to evaluate this further.   Follow up 3 months  It was a pleasure to see you today. I want to create trusting relationships with patients and provide genuine, compassionate, and quality care. I truly value your feedback! please be on the lookout for a survey regarding your visit with me today. I appreciate your input about our visit and your time in completing this!    Tylee Yum L. Jeanmarie Hubert, MSN, APRN, AGNP-C Adult-Gerontology Nurse Practitioner [facility] Gastroenterology at [facility]

## 2023-01-22 ENCOUNTER — Ambulatory Visit (INDEPENDENT_AMBULATORY_CARE_PROVIDER_SITE_OTHER): Payer: Medicare Other | Admitting: "Endocrinology

## 2023-01-22 ENCOUNTER — Encounter: Payer: Self-pay | Admitting: "Endocrinology

## 2023-01-22 VITALS — BP 130/82 | HR 64 | Ht 66.5 in | Wt 165.0 lb

## 2023-01-22 DIAGNOSIS — E0590 Thyrotoxicosis, unspecified without thyrotoxic crisis or storm: Secondary | ICD-10-CM | POA: Diagnosis not present

## 2023-01-22 NOTE — Progress Notes (Signed)
01/22/2023    Endocrinology follow-up note   Subjective:    Patient ID: Amber [name], female    DOB: August 09, 1945, PCP [name], MD.   Past Medical History:  Diagnosis Date   ABLA (acute blood loss anemia) 06/19/2021   Anxiety    Aortic atherosclerosis (HCC)    Aspiration pneumonia of right lung due to gastric secretions (HCC)    C. difficile colitis 07/13/2021   COVID-19 virus infection 06/08/2021    Depression    DVT of leg (deep venous thrombosis) (HCC) 05/2021   Dysphagia    Failure to thrive in adult    GERD (gastroesophageal reflux disease)    History of kidney stones    right ureter   Hypertension    Hyperthyroidism    Kidney disease    Pulmonary embolism (HCC) 05/2021   Stroke [facility])    showed up in MRI   Thoracic aortic aneurysm [facility])    Urinary incontinence     Past Surgical History:  Procedure Laterality Date   BIOPSY  06/14/2022   Procedure: BIOPSY;  Surgeon: Dolores Frame, MD;  [facility];  Service: Gastroenterology;;   COLONOSCOPY WITH PROPOFOL N/A 06/14/2022   Procedure: COLONOSCOPY WITH PROPOFOL;  Surgeon: Dolores Frame, MD;  [facility];  Service: Gastroenterology;  Laterality: N/A;  11:00am, asa 3   CYSTOSCOPY W/ URETERAL STENT PLACEMENT Right 12/12/2021   Procedure: CYSTOSCOPY WITH RETROGRADE PYELOGRAM/URETERAL STENT PLACEMENT;  Surgeon: Vanna Scotland, MD;  Location: [facility] OR[facility];  Service: Urology;  Laterality: Right;   CYSTOSCOPY/URETEROSCOPY/HOLMIUM LASER/STENT PLACEMENT Right 01/14/2022   Procedure: CYSTOSCOPY/URETEROSCOPY/HOLMIUM LASER/STENT EXCHANGE;  Surgeon: Vanna Scotland, MD;  Location: [facility] OR[facility];  Service: Urology;  Laterality: Right;   ESOPHAGOGASTRODUODENOSCOPY (EGD) WITH PROPOFOL N/A 06/14/2022   Procedure: ESOPHAGOGASTRODUODENOSCOPY (EGD) WITH PROPOFOL;  Surgeon: Dolores Frame, MD;  [facility];  Service: Gastroenterology;  Laterality: N/A;   POLYPECTOMY   06/14/2022   Procedure: POLYPECTOMY;  Surgeon: Marguerita Merles, Reuel Boom, MD;  [facility];  Service: Gastroenterology;;    Social History   Socioeconomic History   Marital status: Married    Spouse name: Fayrene Fearing   Number of children: 5   Years of education: Not on file   Highest education level: Not on file  Occupational History   Not on file  Tobacco Use   Smoking status: Former    Current packs/day: 0.00    Types: Cigarettes    Quit date: 05/27/2018    Years since quitting: 4.6   Smokeless tobacco: Never  Vaping Use   Vaping status: Never Used  Substance and Sexual Activity   Alcohol use: Not Currently   Drug use: Not Currently   Sexual activity: Not Currently  Other Topics Concern   Not on file  Social History Narrative   Right handed    one story home    lives with family    Doesn't work currently   Social Determinants of Corporate investment banker Strain: Not on file  Food Insecurity: Not on file  Transportation Needs: Not on file  Physical Activity: Not on file  Stress: Not on file  Social Connections: Not on file    Family History  Problem Relation Age of Onset   High blood pressure Maternal Grandmother     Outpatient Encounter Medications as of 01/22/2023  Medication Sig   polyethylene glycol powder (GLYCOLAX/MIRALAX) 17 GM/SCOOP powder Take 17 g by mouth as needed for mild  constipation.   acetaminophen (TYLENOL) 500 MG tablet Take 500 mg by mouth every 6 (six) hours as needed for moderate pain or mild pain.   ARIPiprazole (ABILIFY) 5 MG tablet Take 5 mg by mouth at bedtime.   escitalopram (LEXAPRO) 20 MG tablet Take 20 mg by mouth daily.   ferrous sulfate 325 (65 FE) MG tablet Take 1 tablet (325 mg total) by mouth daily with breakfast.   methimazole (TAPAZOLE) 5 MG tablet TAKE 1/2 TABLET BY MOUTH ONCE DAILY.   mirtazapine (REMERON) 15 MG tablet Take 1 tablet (15 mg total) by mouth at bedtime.   Multiple Vitamin (MULTIVITAMIN WITH MINERALS)  TABS tablet Take 1 tablet by mouth daily.   omeprazole (PRILOSEC) 40 MG capsule TAKE ONE CAPSULE BY MOUTH ONCE DAILY. (Patient taking differently: Take 40 mg by mouth daily as needed.)   OVER THE COUNTER MEDICATION Stool softner as needed   [DISCONTINUED] ondansetron (ZOFRAN-ODT) 8 MG disintegrating tablet Take 1 tablet (8 mg total) by mouth every 12 (twelve) hours as needed for nausea or vomiting. (Patient not taking: Reported on 11/25/2022)   No facility-administered encounter medications on file as of 01/22/2023.    ALLERGIES: Allergies  Allergen Reactions   Fish Allergy     Does not eat fish   Shellfish Allergy     "I pass out"   Valium [Diazepam] Other (See Comments)    Makes her want to " fly"    VACCINATION STATUS:  There is no immunization history on file for this patient.   HPI  Amber [name] is 77 y.o. female who presents today with a medical history as above. she is being seen in follow-up after she was seen in consultation for hyperthyroidism requested by [name], MD.    After thyroid uptake and scan showed cold nodule in the left lobe of her thyroid, she was considered for dedicated thyroid ultrasound which confirms 2.8 cm suspicious nodule corresponding to the area of cold nodule on the uptake and scan. She underwent  FNA biopsy of this nodule with inconclusive cytology which needed molecular study. Her afirma results are benign. she has been dealing with symptoms of anxiety, weight loss, sleep disturbance for several weeks. She was started on low dose methimazole for mild hyperthyroidism.  She is currently on methimazole 2.5 mg p.o. daily at breakfast with sufficient clinical response.  She feels better, has no new complaints today.    she denies dysphagia, choking, shortness of breath, no recent voice change.   She c/o nausea , scheduled to see GI. she denies family history of thyroid dysfunction nor thyroid malignancy. she denies personal history of goiter.  she is not on any anti-thyroid medications nor on any thyroid hormone supplements. she  is willing to proceed with appropriate work up and therapy for thyrotoxicosis. She is accompanied by her daughter. During her prior labs she was found to have mild hypercalcemia, which has since resolved.  She was found to have mild elevation of PTH on the background of CKD.  She denies any prior history of parathyroid dysfunction.                              Review of systems  Constitutional: + Steady weight gain, + fatigue, + subjective hyperthermia Eyes: no blurry vision, +xerophthalmia ENT: no sore throat, no nodules palpated in throat, no dysphagia/odynophagia, nor hoarseness    Objective:    BP 130/82   Pulse  64   Ht 5' 6.5" (1.689 m)   Wt 165 lb (74.8 kg)   BMI 26.23 kg/m   Wt Readings from Last 3 Encounters:  01/22/23 165 lb (74.8 kg)  11/25/22 154 lb 11.2 oz (70.2 kg)  10/22/22 152 lb 6.4 oz (69.1 kg)                                                Physical exam  Constitutional: Body mass index is 26.23 kg/m., not in acute distress, + anxious state of mind Eyes: PERRLA, EOMI,  exophthalmos    CMP     Component Value Date/Time   NA 143 04/29/2022 1143   NA 143 03/19/2022 1108   K 3.9 04/29/2022 1143   CL 109 04/29/2022 1143   CO2 28 04/29/2022 1143   GLUCOSE 107 (H) 04/29/2022 1143   BUN 20 04/29/2022 1143   BUN 15 03/19/2022 1108   CREATININE 1.06 (H) 04/29/2022 1143   CALCIUM 10.1 12/31/2022 0840   PROT 6.1 04/29/2022 1143   PROT 6.5 05/04/2021 1531   ALBUMIN 2.3 (L) 12/13/2021 0512   ALBUMIN 3.4 (L) 05/04/2021 1531   AST 10 04/29/2022 1143   AST 9 (L) 10/23/2021 1411   ALT 5 (L) 04/29/2022 1143   ALT 6 10/23/2021 1411   ALKPHOS 46 12/13/2021 0512   BILITOT 0.4 04/29/2022 1143   BILITOT 0.4 10/23/2021 1411   GFRNONAA >60 12/17/2021 0555   GFRNONAA 46 (L) 10/23/2021 1411     CBC    Component Value Date/Time   WBC 5.7 04/29/2022 1143   RBC 4.22 04/29/2022  1143   HGB 12.1 04/29/2022 1143   HGB 11.4 03/19/2022 1108   HCT 37.2 04/29/2022 1143   HCT 34.8 03/19/2022 1108   PLT 184 04/29/2022 1143   PLT 200 03/19/2022 1108   MCV 88.2 04/29/2022 1143   MCV 89 03/19/2022 1108   MCH 28.7 04/29/2022 1143   MCHC 32.5 04/29/2022 1143   RDW 13.4 04/29/2022 1143   RDW 14.2 03/19/2022 1108   LYMPHSABS 1,585 04/29/2022 1143   LYMPHSABS 2.1 03/19/2022 1108   MONOABS 0.5 12/12/2021 1515   EOSABS 29 04/29/2022 1143   EOSABS 0.1 03/19/2022 1108   BASOSABS 29 04/29/2022 1143   BASOSABS 0.0 03/19/2022 1108     Lab Results  Component Value Date   TSH 1.550 12/31/2022   TSH 0.862 10/17/2022   TSH 1.870 06/07/2022   TSH 1.670 02/06/2022   TSH 0.060 (L) 12/12/2021   TSH 0.022 (L) 08/30/2021   TSH <0.080 (L) 08/10/2021   TSH 0.545 06/09/2021   TSH 0.559 06/08/2021   TSH 1.560 05/04/2021   FREET4 1.08 12/31/2022   FREET4 1.26 10/17/2022   FREET4 0.98 06/07/2022   FREET4 0.99 02/06/2022   FREET4 1.16 (H) 12/12/2021   FREET4 1.43 08/30/2021     Thyroid uptake and scan on 09/11/21 4 hour I-123 uptake = 2.9% (normal 5-20%),  24 hour I-123 uptake = 11.9% (normal 10-30%)   IMPRESSION: 1. Decreased iodine uptake at 4 hours, with low normal uptake at 24 hours. 2. Diffuse heterogeneity of the thyroid parenchyma, with equivocal findings for cold nodule corresponding to the mid left lobe thyroid lesion seen on prior CT. Correlation with ultrasound is recommended to help guide possible sampling.   FNA- AUS Afirma - Benign  Assessment & Plan:  1. Hyperthyroidism 2.  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 3.  Hypercalcemia: She was recently diagnosed with mild hypercalcemia  I reviewed her labs and thyroid scan with her and her daughter.  She is responding to low-dose methimazole.  She is advised to continue methimazole 2.5 mg p.o. daily at breakfast with plan to repeat thyroid function test  prior to her next visit in 6 months.    She has had benign results of fine-needle aspiration and molecular study to 2.8 cm nodule in the left lobe of her thyroid.   She is already on Metoprolol, no need for additional Beta blocker. Her hypercalcemia has now resolved.  She continues to have mild elevation of PTH on the background of mild CKD.   Not clear whether this is primary hyperparathyroidism or CKD effect.   -She will not need active intervention, however will need repeat labs.  She will also need bone density during her annual physical exam.  She is advised to wear a compression LE sleeve to help with peripheral dependent edema. -Patient is advised to maintain close follow up with [name], MD for primary care needs.   I spent  22  minutes in the care of the patient today including review of labs from Thyroid Function, CMP, and other relevant labs ; imaging/biopsy records (current and previous including abstractions from other facilities); face-to-face time discussing  her lab results and symptoms, medications doses, her options of short and long term treatment based on the latest standards of care / guidelines;   and documenting the encounter.  Amber [name]  participated in the discussions, expressed understanding, and voiced agreement with the above plans.  All questions were answered to her satisfaction. she is encouraged to contact clinic should she have any questions or concerns prior to her return visit.    Follow up plan: Return in about 6 months (around 07/25/2023) for F/U with Pre-visit Labs.   Thank you for involving me in the care of this pleasant patient, and I will continue to update you with her progress.  Marquis Lunch, MD [facility] Phone: [phone]  Fax: [phone]   01/22/2023, 4:31 PM  This note was partially dictated with voice recognition software. Similar sounding words can be transcribed  inadequately or may not  be corrected upon review.

## 2023-02-01 LAB — BASIC METABOLIC PANEL: Calcium: 10.6 mg/dL — ABNORMAL HIGH (ref 8.7–10.3)

## 2023-02-06 ENCOUNTER — Encounter: Payer: Self-pay | Admitting: Internal Medicine

## 2023-02-06 ENCOUNTER — Ambulatory Visit (INDEPENDENT_AMBULATORY_CARE_PROVIDER_SITE_OTHER): Payer: Medicare Other | Admitting: Internal Medicine

## 2023-02-06 VITALS — BP 142/88 | HR 93 | Ht 66.5 in | Wt 169.0 lb

## 2023-02-06 DIAGNOSIS — N1831 Chronic kidney disease, stage 3a: Secondary | ICD-10-CM

## 2023-02-06 DIAGNOSIS — E0590 Thyrotoxicosis, unspecified without thyrotoxic crisis or storm: Secondary | ICD-10-CM | POA: Diagnosis not present

## 2023-02-06 DIAGNOSIS — Z13820 Encounter for screening for osteoporosis: Secondary | ICD-10-CM

## 2023-02-06 DIAGNOSIS — K589 Irritable bowel syndrome without diarrhea: Secondary | ICD-10-CM

## 2023-02-06 DIAGNOSIS — E212 Other hyperparathyroidism: Secondary | ICD-10-CM

## 2023-02-06 DIAGNOSIS — F323 Major depressive disorder, single episode, severe with psychotic features: Secondary | ICD-10-CM

## 2023-02-06 DIAGNOSIS — Z780 Asymptomatic menopausal state: Secondary | ICD-10-CM

## 2023-02-06 NOTE — Progress Notes (Signed)
Established Patient Office Visit  Subjective:  Patient ID: Amber [name], female    DOB: 13-Feb-1946  Age: 77 y.o. MRN: [id number]  CC:  Chief Complaint  Patient presents with   Depression    F/u   GI Problem    Frequent  lower abdomen pain.     HPI Amber [name] is a 77 y.o. female with past medical history of aortic atherosclerosis, depression with anxiety, PE, hyperthyroidism and protein-calorie malnutrition who presents for f/u of her chronic medical conditions.  Her daughter is present during the visit today.  Nausea/GERD: She has chronic nausea, mildly improved with omeprazole, but stopped it later as it was ineffective. She has had EGD and colonoscopy, which were unremarkable overall except for small sessile polyp and hemorrhoids.  Her appetite has improved now and she has been gaining weight, about 19 lbs since the last visit.  She denies any diarrhea, melena or hematochezia currently.  She has chronic constipation, but does not like taking any laxative.  She feels as if she needs to have a bowel movement, but does not have a satisfactory bowel movement.  Hyperthyroidism: On methimazole, followed by Dr. Fransico Him.   Depression with anxiety: Followed by psychiatry - [facility].  She has been placed on Lexapro and Abilify.  She is on Remeron as well.  She is still complains of anxiety/agitation and depressed mood, but has improved now.  Denies any SI or HI currently.  Past Medical History:  Diagnosis Date   ABLA (acute blood loss anemia) 06/19/2021   Anxiety    Aortic atherosclerosis (HCC)    Aspiration pneumonia of right lung due to gastric secretions (HCC)    C. difficile colitis 07/13/2021   COVID-19 virus infection 06/08/2021    Depression    DVT of leg (deep venous thrombosis) (HCC) 05/2021   Dysphagia    Failure to thrive in adult    GERD (gastroesophageal reflux disease)    History of kidney stones    right ureter   Hypertension    Hyperthyroidism    Kidney  disease    Pulmonary embolism (HCC) 05/2021   Stroke [facility])    showed up in MRI   Thoracic aortic aneurysm [facility])    Urinary incontinence     Past Surgical History:  Procedure Laterality Date   BIOPSY  06/14/2022   Procedure: BIOPSY;  Surgeon: Dolores Frame, MD;  [facility];  Service: Gastroenterology;;   COLONOSCOPY WITH PROPOFOL N/A 06/14/2022   Procedure: COLONOSCOPY WITH PROPOFOL;  Surgeon: Dolores Frame, MD;  [facility];  Service: Gastroenterology;  Laterality: N/A;  11:00am, asa 3   CYSTOSCOPY W/ URETERAL STENT PLACEMENT Right 12/12/2021   Procedure: CYSTOSCOPY WITH RETROGRADE PYELOGRAM/URETERAL STENT PLACEMENT;  Surgeon: Vanna Scotland, MD;  Location: [facility];  Service: Urology;  Laterality: Right;   CYSTOSCOPY/URETEROSCOPY/HOLMIUM LASER/STENT PLACEMENT Right 01/14/2022   Procedure: CYSTOSCOPY/URETEROSCOPY/HOLMIUM LASER/STENT EXCHANGE;  Surgeon: Vanna Scotland, MD;  Location: [facility];  Service: Urology;  Laterality: Right;   ESOPHAGOGASTRODUODENOSCOPY (EGD) WITH PROPOFOL N/A 06/14/2022   Procedure: ESOPHAGOGASTRODUODENOSCOPY (EGD) WITH PROPOFOL;  Surgeon: Dolores Frame, MD;  [facility];  Service: Gastroenterology;  Laterality: N/A;   POLYPECTOMY  06/14/2022   Procedure: POLYPECTOMY;  Surgeon: Dolores Frame, MD;  [facility];  Service: Gastroenterology;;    Family History  Problem Relation Age of Onset   High blood pressure Maternal Grandmother     Social History   Socioeconomic History  Marital status: Married    Spouse name: Fayrene Fearing   Number of children: 5   Years of education: Not on file   Highest education level: Not on file  Occupational History   Not on file  Tobacco Use   Smoking status: Former    Current packs/day: 0.00    Types: Cigarettes    Quit date: 05/27/2018    Years since quitting: 4.7   Smokeless tobacco: Never  Vaping Use   Vaping status: Never Used   Substance and Sexual Activity   Alcohol use: Not Currently   Drug use: Not Currently   Sexual activity: Not Currently  Other Topics Concern   Not on file  Social History Narrative   Right handed    one story home    lives with family    Doesn't work currently   Social Determinants of Corporate investment banker Strain: Not on file  Food Insecurity: Not on file  Transportation Needs: Not on file  Physical Activity: Not on file  Stress: Not on file  Social Connections: Not on file  Intimate Partner Violence: Not on file    Outpatient Medications Prior to Visit  Medication Sig Dispense Refill   acetaminophen (TYLENOL) 500 MG tablet Take 500 mg by mouth every 6 (six) hours as needed for moderate pain or mild pain.     ARIPiprazole (ABILIFY) 5 MG tablet Take 5 mg by mouth at bedtime.     escitalopram (LEXAPRO) 20 MG tablet Take 20 mg by mouth daily.     ferrous sulfate 325 (65 FE) MG tablet Take 1 tablet (325 mg total) by mouth daily with breakfast. 30 tablet 0   methimazole (TAPAZOLE) 5 MG tablet TAKE 1/2 TABLET BY MOUTH ONCE DAILY. 45 tablet 0   mirtazapine (REMERON) 15 MG tablet Take 1 tablet (15 mg total) by mouth at bedtime. 90 tablet 0   Multiple Vitamin (MULTIVITAMIN WITH MINERALS) TABS tablet Take 1 tablet by mouth daily. 30 tablet 0   OVER THE COUNTER MEDICATION Stool softner as needed     polyethylene glycol powder (GLYCOLAX/MIRALAX) 17 GM/SCOOP powder Take 17 g by mouth as needed for mild constipation. (Patient not taking: Reported on 02/06/2023)     omeprazole (PRILOSEC) 40 MG capsule TAKE ONE CAPSULE BY MOUTH ONCE DAILY. (Patient not taking: Reported on 02/06/2023) 30 capsule 0   No facility-administered medications prior to visit.    Allergies  Allergen Reactions   Fish Allergy     Does not eat fish   Shellfish Allergy     "I pass out"   Valium [Diazepam] Other (See Comments)    Makes her want to " fly"    ROS Review of Systems  Constitutional:  Positive  for fatigue. Negative for chills and fever.  HENT:  Negative for congestion, sinus pressure, sinus pain and sore throat.   Eyes:  Negative for pain and discharge.  Respiratory:  Negative for cough and shortness of breath.   Cardiovascular:  Negative for chest pain and palpitations.  Gastrointestinal:  Positive for constipation and nausea. Negative for abdominal pain, diarrhea and vomiting.  Endocrine: Negative for polydipsia and polyuria.  Genitourinary:  Negative for dysuria and hematuria.  Musculoskeletal:  Positive for gait problem and myalgias. Negative for neck pain and neck stiffness.  Skin:  Negative for rash.  Neurological:  Positive for tremors, weakness and numbness. Negative for dizziness.  Psychiatric/Behavioral:  Positive for agitation, behavioral problems, decreased concentration, dysphoric mood and sleep disturbance. Negative for  suicidal ideas. The patient is nervous/anxious.       Objective:    Physical Exam Vitals reviewed.  Constitutional:      General: She is not in acute distress.    Appearance: She is not diaphoretic.     Comments: Has rolling walker  HENT:     Head: Normocephalic and atraumatic.     Nose: Nose normal.     Mouth/Throat:     Mouth: Mucous membranes are moist.  Eyes:     General: No scleral icterus.    Extraocular Movements: Extraocular movements intact.  Cardiovascular:     Rate and Rhythm: Normal rate and regular rhythm.     Pulses: Normal pulses.     Heart sounds: Normal heart sounds. No murmur heard. Pulmonary:     Breath sounds: Normal breath sounds. No wheezing or rales.  Abdominal:     Palpations: Abdomen is soft.     Tenderness: There is no abdominal tenderness.  Musculoskeletal:     Cervical back: Neck supple. No tenderness.     Right lower leg: No edema.     Left lower leg: No edema.  Skin:    General: Skin is warm.     Findings: No rash.  Neurological:     General: No focal deficit present.     Mental Status: She is  alert and oriented to person, place, and time.     Motor: Weakness (3/5 in b/l UE and LE) present.     Gait: Gait abnormal (Slow, unsteady gait).  Psychiatric:        Mood and Affect: Mood is anxious.        Behavior: Behavior is cooperative.     BP (!) 142/88 (BP Location: Right Arm)   Pulse 93   Ht 5' 6.5" (1.689 m)   Wt 169 lb (76.7 kg)   SpO2 97%   BMI 26.87 kg/m  Wt Readings from Last 3 Encounters:  02/06/23 169 lb (76.7 kg)  01/22/23 165 lb (74.8 kg)  11/25/22 154 lb 11.2 oz (70.2 kg)    Lab Results  Component Value Date   TSH 1.550 12/31/2022   Lab Results  Component Value Date   WBC 5.7 04/29/2022   HGB 12.1 04/29/2022   HCT 37.2 04/29/2022   MCV 88.2 04/29/2022   PLT 184 04/29/2022   Lab Results  Component Value Date   NA 142 01/30/2023   K 4.0 01/30/2023   CO2 22 01/30/2023   GLUCOSE 81 01/30/2023   BUN 20 01/30/2023   CREATININE 1.25 (H) 01/30/2023   BILITOT 0.4 04/29/2022   ALKPHOS 46 12/13/2021   AST 10 04/29/2022   ALT 5 (L) 04/29/2022   PROT 6.1 04/29/2022   ALBUMIN 2.3 (L) 12/13/2021   CALCIUM 10.6 (H) 01/30/2023   ANIONGAP 6 12/17/2021   EGFR 45 (L) 01/30/2023   Lab Results  Component Value Date   CHOL 162 10/19/2021   Lab Results  Component Value Date   HDL 46 10/19/2021   Lab Results  Component Value Date   LDLCALC 97 10/19/2021   Lab Results  Component Value Date   TRIG 102 10/19/2021   Lab Results  Component Value Date   CHOLHDL 3.5 10/19/2021   Lab Results  Component Value Date   HGBA1C 4.9 10/19/2021      Assessment & Plan:   Problem List Items Addressed This Visit       Digestive   IBS (irritable bowel syndrome)    Has  chronic constipation Needs to take MiraLAX as needed for constipation Advised to take Benefiber or Metamucil Needs to improve fluid intake        Endocrine   Hyperthyroidism    Has multinodular goiter Followed by Dr. Fransico Him On methimazole Her symptoms have been improving now       Other hyperparathyroidism [facility])    Lab Results  Component Value Date   PTH 120 (H) 01/30/2023   CALCIUM 10.6 (H) 01/30/2023   PHOS 2.5 (L) 10/17/2022   Has had borderline elevated calcium in the past Can explain psychosis, nephrolithiasis and tachycardia Referred to endocrinology - they believe it is due to CKD        Genitourinary   Stage 3a chronic kidney disease (HCC)    Needs to maintain adequate fluid intake - has poor PO intake of fluids Continue vitamin D supplement Avoid nephrotoxic agents        Other   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HCC) - Primary    Has had ECT during hospitalization (2023) Continue Abilify, Remeron and Lexapro Followed by psychiatry - [facility]      Other Visit Diagnoses     Screening for osteoporosis       Postmenopausal       Relevant Orders   DG Bone Density       No orders of the defined types were placed in this encounter.   Follow-up: Return in about 6 months (around 08/06/2023).    [name], MD

## 2023-02-06 NOTE — Assessment & Plan Note (Signed)
Has had ECT during hospitalization (2023) Continue Abilify, Remeron and Lexapro Followed by psychiatry - [facility]

## 2023-02-06 NOTE — Assessment & Plan Note (Signed)
Lab Results  Component Value Date   PTH 120 (H) 01/30/2023   CALCIUM 10.6 (H) 01/30/2023   PHOS 2.5 (L) 10/17/2022   Has had borderline elevated calcium in the past Can explain psychosis, nephrolithiasis and tachycardia Referred to endocrinology - they believe it is due to CKD

## 2023-02-06 NOTE — Assessment & Plan Note (Signed)
Needs to maintain adequate fluid intake - has poor PO intake of fluids Continue vitamin D supplement Avoid nephrotoxic agents

## 2023-02-06 NOTE — Assessment & Plan Note (Signed)
Has multinodular goiter Followed by Dr. Nida On methimazole Her symptoms have been improving now 

## 2023-02-06 NOTE — Patient Instructions (Signed)
Please start taking Metamucil or Benefiber for constipation.  Please take at least 64 ounces of fluid in a day.

## 2023-02-06 NOTE — Assessment & Plan Note (Signed)
Has chronic constipation Needs to take MiraLAX as needed for constipation Advised to take Benefiber or Metamucil Needs to improve fluid intake

## 2023-02-13 ENCOUNTER — Ambulatory Visit (INDEPENDENT_AMBULATORY_CARE_PROVIDER_SITE_OTHER): Payer: Medicare Other | Admitting: Neurology

## 2023-02-13 ENCOUNTER — Encounter: Payer: Self-pay | Admitting: Neurology

## 2023-02-13 VITALS — BP 222/114 | HR 92 | Ht 66.0 in | Wt 172.0 lb

## 2023-02-13 DIAGNOSIS — R410 Disorientation, unspecified: Secondary | ICD-10-CM

## 2023-02-13 DIAGNOSIS — R413 Other amnesia: Secondary | ICD-10-CM

## 2023-02-13 NOTE — Progress Notes (Signed)
Chief Complaint  Patient presents with   New Patient (Initial Visit)    Rm15, daughter [name], NP Paper referral for Anhedonia - eval for frontotemporal dementia:moca was 22      ASSESSMENT AND PLAN  Amber [name] is a 77 y.o. 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   MEDICAL HISTORY:  Amber [name] is a 77 year old female, seen in request by her primary Care physician Dr. Allena Katz, [place], for evaluation of memory loss, personality change, she is accompanied by her daughter [name] at today's visit February 13, 2023, also received information from compassionate care Dr. Rosalene Billings, [place],    I reviewed and summarized the referring note. PMHX. Depression, anxiety for 3 months Hx of smoke, quit in 2020.   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 intake, worsening confusion, hallucinations, refused to eat, was seen  by psychiatrist, determined her to not have capacity for medical decision-making, NG tube was placed for force-feeding,  She was treated with Zyprexa, completed ECT treatment, was seen by psychiatrist then, diagnosed with severe depressed with psychotic features,  Daughter reported some improvement with her symptoms with ECT treatment, but with frequent change of antidepression medications, patient still not back to her normal self, she is more forgetful, very clinging, wants somebody to be with her all the time, afraid to die, neglect personal hygiene, moved from bed to chair, to dining table, she can eat better now, but not motivated to do anything else  She was seen by Dr. Rosalene Billings, raised the possibility of frontotemporal dementia.  Today's MoCA examination 20/30  Personally reviewed MRI of the brain November 2022, no acute abnormality, mild atrophy, small vessel disease CT head July 2023 no acute abnormality,  Lab in 2024 showed normal free T3-T4, TSH, elevated PTH, calcium 10.3,  PHYSICAL EXAM:   Vitals:   02/13/23 1509  BP: (!) 222/114  Pulse: 92  Weight: 172 lb (78 kg)  Height: 5\' 6"  (1.676 m)   Not recorded     Body mass index is 27.76 kg/m.  PHYSICAL EXAMNIATION:  Gen: NAD, conversant, well nourised, well groomed                     Cardiovascular: Regular rate rhythm, no peripheral edema, warm, nontender. Eyes: Conjunctivae clear without exudates or hemorrhage Neck: Supple, no carotid bruits. Pulmonary: Clear to auscultation bilaterally   NEUROLOGICAL EXAM:  MENTAL STATUS: Speech/cognition: Awake, alert, oriented to history taking and casual conversation  02/13/2023    3:12 PM  Montreal Cognitive Assessment   Visuospatial/ Executive (0/5) 2  Naming (0/3) 2  Attention: Read list of digits (0/2) 1  Attention: Read list of letters (0/1) 1  Attention: Serial 7 subtraction starting at 100 (0/3) 3  Language: Repeat phrase (0/2) 0  Language : Fluency (0/1) 1   Abstraction (0/2) 2  Delayed Recall (0/5) 2  Orientation (0/6) 6  Total 20    CRANIAL NERVES: CN II: Visual fields are full to confrontation. Pupils are round equal and briskly reactive to light. CN III, IV, VI: extraocular movement are normal. No ptosis. CN V: Facial sensation is intact to light touch CN VII: Face is symmetric with normal eye closure  CN VIII: Hearing is normal to causal conversation. CN IX, X: Phonation is normal. CN XI: Head turning and shoulder shrug are intact  MOTOR: There is no pronator drift of out-stretched arms. Muscle bulk and tone are normal. Muscle strength is normal.  REFLEXES: Reflexes are 2+ and symmetric at the biceps, triceps, knees, and ankles. Plantar responses are flexor.  SENSORY: Intact to light touch, pinprick and vibratory sensation are intact in fingers and toes.  COORDINATION: There is no trunk or limb dysmetria noted.  GAIT/STANCE: Posture is normal. Gait is steady with normal steps, base, arm swing, and turning. Heel and toe walking are normal. Tandem gait is normal.  Romberg is absent.  REVIEW OF SYSTEMS:  Full 14 system review of systems performed and notable only for as above All other review of systems were negative.   ALLERGIES: Allergies  Allergen Reactions   Fish Allergy     Does not eat fish   Shellfish Allergy     "I pass out"   Valium [Diazepam] Other (See Comments)    Makes her want to " fly"    HOME MEDICATIONS: Current Outpatient Medications  Medication Sig Dispense Refill   acetaminophen (TYLENOL) 500 MG tablet Take 500 mg by mouth every 6 (six) hours as needed for moderate pain or mild pain.     ARIPiprazole (ABILIFY) 5 MG tablet Take 5 mg by mouth at bedtime.     escitalopram (LEXAPRO) 20 MG tablet Take 20 mg by mouth daily.     ferrous sulfate 325 (65 FE) MG tablet Take 1 tablet (325 mg total) by mouth daily with breakfast. 30 tablet 0   methimazole (TAPAZOLE) 5 MG tablet TAKE 1/2 TABLET BY MOUTH  ONCE DAILY. 45 tablet 0   mirtazapine (REMERON) 15 MG tablet Take 1 tablet (15 mg total) by mouth at bedtime. 90 tablet 0   Multiple Vitamin (MULTIVITAMIN WITH MINERALS) TABS tablet Take 1 tablet by mouth daily. 30 tablet 0   OVER THE COUNTER MEDICATION Stool softner as needed     polyethylene glycol powder (GLYCOLAX/MIRALAX) 17 GM/SCOOP powder Take 17 g by mouth as needed for mild constipation.     No current facility-administered medications for this visit.    PAST MEDICAL HISTORY: Past Medical History:  Diagnosis Date   ABLA (acute blood loss anemia) 06/19/2021   Anxiety    Aortic atherosclerosis (HCC)    Aspiration pneumonia of right lung due to gastric secretions (HCC)    C. difficile colitis 07/13/2021   COVID-19 virus infection 06/08/2021    Depression    DVT of leg (deep venous thrombosis) (HCC) 05/2021   Dysphagia    Failure to thrive in adult    GERD (gastroesophageal reflux disease)    History of kidney stones  right ureter   Hypertension    Hyperthyroidism    Kidney disease    Pulmonary embolism (HCC) 05/2021   Stroke [facility])    showed up in MRI   Thoracic aortic aneurysm [facility])    Urinary incontinence     PAST SURGICAL HISTORY: Past Surgical History:  Procedure Laterality Date   BIOPSY  06/14/2022   Procedure: BIOPSY;  Surgeon: Dolores Frame, MD;  [facility];  Service: Gastroenterology;;   COLONOSCOPY WITH PROPOFOL N/A 06/14/2022   Procedure: COLONOSCOPY WITH PROPOFOL;  Surgeon: Dolores Frame, MD;  [facility];  Service: Gastroenterology;  Laterality: N/A;  11:00am, asa 3   CYSTOSCOPY W/ URETERAL STENT PLACEMENT Right 12/12/2021   Procedure: CYSTOSCOPY WITH RETROGRADE PYELOGRAM/URETERAL STENT PLACEMENT;  Surgeon: Vanna Scotland, MD;  Location: [facility];  Service: Urology;  Laterality: Right;   CYSTOSCOPY/URETEROSCOPY/HOLMIUM LASER/STENT PLACEMENT Right 01/14/2022   Procedure: CYSTOSCOPY/URETEROSCOPY/HOLMIUM  LASER/STENT EXCHANGE;  Surgeon: Vanna Scotland, MD;  Location: [facility];  Service: Urology;  Laterality: Right;   ESOPHAGOGASTRODUODENOSCOPY (EGD) WITH PROPOFOL N/A 06/14/2022   Procedure: ESOPHAGOGASTRODUODENOSCOPY (EGD) WITH PROPOFOL;  Surgeon: Dolores Frame, MD;  [facility];  Service: Gastroenterology;  Laterality: N/A;   POLYPECTOMY  06/14/2022   Procedure: POLYPECTOMY;  Surgeon: Marguerita Merles, Reuel Boom, MD;  [facility];  Service: Gastroenterology;;    FAMILY HISTORY: Family History  Problem Relation Age of Onset   High blood pressure Maternal Grandmother     SOCIAL HISTORY: Social History   Socioeconomic History   Marital status: Married    Spouse name: Fayrene Fearing   Number of children: 5   Years of education: Not on file   Highest education level: Not on file  Occupational History   Not on file  Tobacco Use   Smoking status: Former    Current packs/day: 0.00    Types: Cigarettes    Quit date: 05/27/2018    Years since quitting: 4.7   Smokeless tobacco: Never  Vaping Use   Vaping status: Never Used  Substance and Sexual Activity   Alcohol use: Not Currently   Drug use: Not Currently   Sexual activity: Not Currently  Other Topics Concern   Not on file  Social History Narrative   Right handed    one story home    lives with family    Doesn't work currently   Social Determinants of Corporate investment banker Strain: Not on file  Food Insecurity: Not on file  Transportation Needs: Not on file  Physical Activity: Not on file  Stress: Not on file  Social Connections: Not on file  Intimate Partner Violence: Not on file      Levert Feinstein, M.D. Ph.D.  [facility] [address] [place], [place] [address] Ph: [phone] Fax: [phone]  CC:  [name], MD [address] [place],  [place] [address]  [name], MD

## 2023-02-14 ENCOUNTER — Telehealth: Payer: Self-pay | Admitting: Neurology

## 2023-02-14 NOTE — Telephone Encounter (Signed)
medicare NPR sent to GI [phone]

## 2023-03-03 LAB — AUTOIMMUNE NEUROLOGY AB

## 2023-03-03 LAB — CBC WITH DIFFERENTIAL/PLATELET
Basophils Absolute: 0 10*3/uL (ref 0.0–0.2)
Basos: 1 %
EOS (ABSOLUTE): 0.1 10*3/uL (ref 0.0–0.4)
Eos: 1 %
Hematocrit: 38.8 % (ref 34.0–46.6)
Hemoglobin: 12.4 g/dL (ref 11.1–15.9)
Immature Grans (Abs): 0 10*3/uL (ref 0.0–0.1)
Immature Granulocytes: 0 %
Lymphocytes Absolute: 1.8 10*3/uL (ref 0.7–3.1)
Lymphs: 29 %
MCH: 28.9 pg (ref 26.6–33.0)
MCHC: 32 g/dL (ref 31.5–35.7)
MCV: 90 fL (ref 79–97)
Monocytes Absolute: 0.4 10*3/uL (ref 0.1–0.9)
Monocytes: 7 %
Neutrophils Absolute: 4 10*3/uL (ref 1.4–7.0)
Neutrophils: 62 %
Platelets: 198 10*3/uL (ref 150–450)
RBC: 4.29 x10E6/uL (ref 3.77–5.28)
RDW: 13.3 % (ref 11.7–15.4)
WBC: 6.3 10*3/uL (ref 3.4–10.8)

## 2023-03-03 LAB — MULTIPLE MYELOMA PANEL, SERUM
Albumin SerPl Elph-Mcnc: 3.5 g/dL (ref 2.9–4.4)
Albumin/Glob SerPl: 1.2 (ref 0.7–1.7)
Alpha 1: 0.3 g/dL (ref 0.0–0.4)
Alpha2 Glob SerPl Elph-Mcnc: 0.8 g/dL (ref 0.4–1.0)
B-Globulin SerPl Elph-Mcnc: 0.9 g/dL (ref 0.7–1.3)
Gamma Glob SerPl Elph-Mcnc: 1.1 g/dL (ref 0.4–1.8)
Globulin, Total: 3.1 g/dL (ref 2.2–3.9)
IgA/Immunoglobulin A, Serum: 149 mg/dL (ref 64–422)
IgG (Immunoglobin G), Serum: 1086 mg/dL (ref 586–1602)
IgM (Immunoglobulin M), Srm: 152 mg/dL (ref 26–217)

## 2023-03-03 LAB — COMPREHENSIVE METABOLIC PANEL
ALT: 5 [IU]/L (ref 0–32)
AST: 12 [IU]/L (ref 0–40)
Albumin: 4 g/dL (ref 3.8–4.8)
Alkaline Phosphatase: 97 [IU]/L (ref 44–121)
BUN/Creatinine Ratio: 16 (ref 12–28)
BUN: 18 mg/dL (ref 8–27)
Bilirubin Total: 0.3 mg/dL (ref 0.0–1.2)
CO2: 21 mmol/L (ref 20–29)
Calcium: 10.8 mg/dL — ABNORMAL HIGH (ref 8.7–10.3)
Chloride: 107 mmol/L — ABNORMAL HIGH (ref 96–106)
Creatinine, Ser: 1.11 mg/dL — ABNORMAL HIGH (ref 0.57–1.00)
Globulin, Total: 2.6 g/dL (ref 1.5–4.5)
Glucose: 87 mg/dL (ref 70–99)
Potassium: 4 mmol/L (ref 3.5–5.2)
Sodium: 143 mmol/L (ref 134–144)
Total Protein: 6.6 g/dL (ref 6.0–8.5)
eGFR: 52 mL/min/{1.73_m2} — ABNORMAL LOW (ref >59)

## 2023-03-03 LAB — VITAMIN B12: Vitamin B-12: 752 pg/mL (ref 232–1245)

## 2023-03-03 LAB — THYROGLOBULIN LEVEL: Thyroglobulin (TG-RIA): 139 ng/mL — ABNORMAL HIGH

## 2023-03-03 LAB — C-REACTIVE PROTEIN: CRP: <1 mg/L (ref 0–10)

## 2023-03-03 LAB — RPR: RPR Ser Ql: NONREACTIVE

## 2023-03-03 LAB — ANA W/REFLEX IF POSITIVE: Anti Nuclear Antibody (ANA): NEGATIVE

## 2023-03-03 LAB — SEDIMENTATION RATE: Sed Rate: 23 mm/h (ref 0–40)

## 2023-03-03 LAB — THYROID PEROXIDASE ANTIBODY: Thyroperoxidase Ab SerPl-aCnc: 13 [IU]/mL (ref 0–34)

## 2023-03-04 ENCOUNTER — Ambulatory Visit (INDEPENDENT_AMBULATORY_CARE_PROVIDER_SITE_OTHER): Payer: Medicare Other

## 2023-03-04 VITALS — Ht 66.0 in | Wt 170.0 lb

## 2023-03-04 DIAGNOSIS — Z0000 Encounter for general adult medical examination without abnormal findings: Secondary | ICD-10-CM

## 2023-03-04 DIAGNOSIS — Z0100 Encounter for examination of eyes and vision without abnormal findings: Secondary | ICD-10-CM

## 2023-03-04 DIAGNOSIS — Z741 Need for assistance with personal care: Secondary | ICD-10-CM

## 2023-03-04 NOTE — Patient Instructions (Signed)
Amber [name] , Thank you for taking time to come for your Medicare Wellness Visit. I appreciate your ongoing commitment to your health goals. Please review the following plan we discussed and let me know if I can assist you in the future.   Referrals/Orders/Follow-Ups/Clinician Recommendations:  Next Medicare Annual Wellness Visit: March 08, 2024 at 9:20 am virtual visit  You have an order for:  []   2D Mammogram  []   3D Mammogram  [x]   Bone Density   []   Lung Cancer Screening  Please call for appointment:   [facility] at [facility] [address] [place], [place] [address] [phone]  Make sure to wear two-piece clothing.  No lotions powders or deodorants the day of the appointment Make sure to bring picture ID and insurance card.  Bring list of medications you are currently taking including any supplements.   Schedule your [place] screening mammogram through MyChart!   Log into your MyChart account.  Go to 'Visit' (or 'Appointments' if on mobile App) --> Schedule an Appointment  Under 'Select a Reason for Visit' choose the Mammogram Screening option.  Complete the pre-visit questions and select the time and place that best fits your schedule.  You've been referred to see Dr. Desiree Lucy to have an eye exam. If you haven't heard from them in the next week please call us   This is a list of the screening recommended for you and due dates:  Health Maintenance  Topic Date Due   Mammogram  Never done   DTaP/Tdap/Td vaccine (1 - Tdap) Never done   Zoster (Shingles) Vaccine (1 of 2) Never done   Pneumonia Vaccine (1 of 1 - PCV) Never done   DEXA scan (bone density measurement)  Never done   Flu Shot  Never done   Medicare Annual Wellness Visit  03/03/2024   Hepatitis C Screening  Completed   HPV Vaccine  Aged Out   Colon Cancer Screening  Discontinued   COVID-19 Vaccine  Discontinued    Advanced directives: (Copy Requested) Please bring a  copy of your health care power of attorney and living will to the office to be added to your chart at your convenience.  Next Medicare Annual Wellness Visit scheduled for next year: Yes  Preventive Care 13 Years and Older, Female Preventive care refers to lifestyle choices and visits with your health care provider that can promote health and wellness. Preventive care visits are also called wellness exams. What can I expect for my preventive care visit? Counseling Your health care provider may ask you questions about your: Medical history, including: Past medical problems. Family medical history. Pregnancy and menstrual history. History of falls. Current health, including: Memory and ability to understand (cognition). Emotional well-being. Home life and relationship well-being. Sexual activity and sexual health. Lifestyle, including: Alcohol, nicotine or tobacco, and drug use. Access to firearms. Diet, exercise, and sleep habits. Work and work Astronomer. Sunscreen use. Safety issues such as seatbelt and bike helmet use. Physical exam Your health care provider will check your: Height and weight. These may be used to calculate your BMI (body mass index). BMI is a measurement that tells if you are at a healthy weight. Waist circumference. This measures the distance around your waistline. This measurement also tells if you are at a healthy weight and may help predict your risk of certain diseases, such as type 2 diabetes and high blood pressure. Heart rate and blood pressure. Body temperature. Skin for abnormal  spots. What immunizations do I need?  Vaccines are usually given at various ages, according to a schedule. Your health care provider will recommend vaccines for you based on your age, medical history, and lifestyle or other factors, such as travel or where you work. What tests do I need? Screening Your health care provider may recommend screening tests for certain  conditions. This may include: Lipid and cholesterol levels. Hepatitis C test. Hepatitis B test. HIV (human immunodeficiency virus) test. STI (sexually transmitted infection) testing, if you are at risk. Lung cancer screening. Colorectal cancer screening. Diabetes screening. This is done by checking your blood sugar (glucose) after you have not eaten for a while (fasting). Mammogram. Talk with your health care provider about how often you should have regular mammograms. BRCA-related cancer screening. This may be done if you have a family history of breast, ovarian, tubal, or peritoneal cancers. Bone density scan. This is done to screen for osteoporosis. Talk with your health care provider about your test results, treatment options, and if necessary, the need for more tests. Follow these instructions at home: Eating and drinking  Eat a diet that includes fresh fruits and vegetables, whole grains, lean protein, and low-fat dairy products. Limit your intake of foods with high amounts of sugar, saturated fats, and salt. Take vitamin and mineral supplements as recommended by your health care provider. Do not drink alcohol if your health care provider tells you not to drink. If you drink alcohol: Limit how much you have to 0-1 drink a day. Know how much alcohol is in your drink. In the [place] one drink equals one 12 oz bottle of beer (355 mL), one 5 oz glass of wine (148 mL), or one 1 oz glass of hard liquor (44 mL). Lifestyle Brush your teeth every morning and night with fluoride toothpaste. Floss one time each day. Exercise for at least 30 minutes 5 or more days each week. Do not use any products that contain nicotine or tobacco. These products include cigarettes, chewing tobacco, and vaping devices, such as e-cigarettes. If you need help quitting, ask your health care provider. Do not use drugs. If you are sexually active, practice safe sex. Use a condom or other form of protection in order to  prevent STIs. Take aspirin only as told by your health care provider. Make sure that you understand how much to take and what form to take. Work with your health care provider to find out whether it is safe and beneficial for you to take aspirin daily. Ask your health care provider if you need to take a cholesterol-lowering medicine (statin). Find healthy ways to manage stress, such as: Meditation, yoga, or listening to music. Journaling. Talking to a trusted person. Spending time with friends and family. Minimize exposure to UV radiation to reduce your risk of skin cancer. Safety Always wear your seat belt while driving or riding in a vehicle. Do not drive: If you have been drinking alcohol. Do not ride with someone who has been drinking. When you are tired or distracted. While texting. If you have been using any mind-altering substances or drugs. Wear a helmet and other protective equipment during sports activities. If you have firearms in your house, make sure you follow all gun safety procedures. What's next? Visit your health care provider once a year for an annual wellness visit. Ask your health care provider how often you should have your eyes and teeth checked. Stay up to date on all vaccines. This information is not  intended to replace advice given to you by your health care provider. Make sure you discuss any questions you have with your health care provider. Document Revised: 11/08/2020 Document Reviewed: 11/08/2020 Elsevier Patient Education  2024 [employer]. Understanding Your Risk for Falls Millions of people have serious injuries from falls each year. It is important to understand your risk of falling. Talk with your health care provider about your risk and what you can do to lower it. If you do have a serious fall, make sure to tell your provider. Falling once raises your risk of falling again. How can falls affect me? Serious injuries from falls are common. These  include: Broken bones, such as hip fractures. Head injuries, such as traumatic brain injuries (TBI) or concussions. A fear of falling can cause you to avoid activities and stay at home. This can make your muscles weaker and raise your risk for a fall. What can increase my risk? There are a number of risk factors that increase your risk for falling. The more risk factors you have, the higher your risk of falling. Serious injuries from a fall happen most often to people who are older than 75 years old. Teenagers and young adults ages 58-29 are also at higher risk. Common risk factors include: Weakness in the lower body. Being generally weak or confused due to long-term (chronic) illness. Dizziness or balance problems. Poor vision. Medicines that cause dizziness or drowsiness. These may include: Medicines for your blood pressure, heart, anxiety, insomnia, or swelling (edema). Pain medicines. Muscle relaxants. Other risk factors include: Drinking alcohol. Having had a fall in the past. Having foot pain or wearing improper footwear. Working at a dangerous job. Having any of the following in your home: Tripping hazards, such as floor clutter or loose rugs. Poor lighting. Pets. Having dementia or memory loss. What actions can I take to lower my risk of falling?     Physical activity Stay physically fit. Do strength and balance exercises. Consider taking a regular class to build strength and balance. Yoga and tai chi are good options. Vision Have your eyes checked every year and your prescription for glasses or contacts updated as needed. Shoes and walking aids Wear non-skid shoes. Wear shoes that have rubber soles and low heels. Do not wear high heels. Do not walk around the house in socks or slippers. Use a cane or walker as told by your provider. Home safety Attach secure railings on both sides of your stairs. Install grab bars for your bathtub, shower, and toilet. Use a non-skid  mat in your bathtub or shower. Attach bath mats securely with double-sided, non-slip rug tape. Use good lighting in all rooms. Keep a flashlight near your bed. Make sure there is a clear path from your bed to the bathroom. Use night-lights. Do not use throw rugs. Make sure all carpeting is taped or tacked down securely. Remove all clutter from walkways and stairways, including extension cords. Repair uneven or broken steps and floors. Avoid walking on icy or slippery surfaces. Walk on the grass instead of on icy or slick sidewalks. Use ice melter to get rid of ice on walkways in the winter. Use a cordless phone. Questions to ask your health care provider Can you help me check my risk for a fall? Do any of my medicines make me more likely to fall? Should I take a vitamin D supplement? What exercises can I do to improve my strength and balance? Should I make an appointment to have  my vision checked? Do I need a bone density test to check for weak bones (osteoporosis)? Would it help to use a cane or a walker? Where to find more information Centers for Disease Control and Prevention, STEADI: TonerPromos.no Community-Based Fall Prevention Programs: TonerPromos.no [employer] on Aging: BaseRingTones.pl Contact a health care provider if: You fall at home. You are afraid of falling at home. You feel weak, drowsy, or dizzy. This information is not intended to replace advice given to you by your health care provider. Make sure you discuss any questions you have with your health care provider. Document Revised: 01/14/2022 Document Reviewed: 01/14/2022 Elsevier Patient Education  2024 Elsevier Inc. Bone Density Test A bone density test uses a type of X-ray to measure the amount of calcium and other minerals in a person's bones. It can measure bone density in the hip and the spine. The test is similar to having a regular X-ray. This test may also be called: Bone densitometry. Bone mineral density  test. Dual-energy X-ray absorptiometry (DEXA). You may have this test to: Diagnose a condition that causes weak or thin bones (osteoporosis). Screen you for osteoporosis. Predict your risk for a broken bone (fracture). Determine how well your osteoporosis treatment is working. Tell a health care provider about: Any allergies you have. All medicines you are taking, including vitamins, herbs, eye drops, creams, and over-the-counter medicines. Any problems you or family members have had with anesthetic medicines. Any blood disorders you have. Any surgeries you have had. Any medical conditions you have. Whether you are pregnant or may be pregnant. Any medical tests you have had within the past 14 days that used contrast material. What are the risks? Generally, this is a safe test. However, it does expose you to a small amount of radiation, which can slightly increase your cancer risk. What happens before the test? Do not take any calcium supplements within the 24 hours before your test. You will need to remove all metal jewelry, eyeglasses, removable dental appliances, and any other metal objects on your body. What happens during the test?  You will lie down on an exam table. There will be an X-ray generator below you and an imaging device above you. Other devices, such as boxes or braces, may be used to position your body properly for the scan. The machine will slowly scan your body. You will need to keep very still while the machine does the scan. The images will show up on a screen in the room. Images will be examined by a specialist after your test is finished. The procedure may vary among health care providers and hospitals. What can I expect after the test? It is up to you to get the results of your test. Ask your health care provider, or the department that is doing the test, when your results will be ready. Summary A bone density test is an imaging test that uses a type of X-ray to  measure the amount of calcium and other minerals in your bones. The test may be used to diagnose or screen you for a condition that causes weak or thin bones (osteoporosis), predict your risk for a broken bone (fracture), or determine how well your osteoporosis treatment is working. Do not take any calcium supplements within 24 hours before your test. Ask your health care provider, or the department that is doing the test, when your results will be ready. This information is not intended to replace advice given to you by your health care provider.  Make sure you discuss any questions you have with your health care provider. Document Revised: 01/24/2021 Document Reviewed: 10/28/2019 Elsevier Patient Education  2024 [employer].

## 2023-03-04 NOTE — Progress Notes (Signed)
 Because this visit was a virtual/telehealth visit,  certain criteria was not obtained, such a blood pressure, CBG if applicable, and timed get up and go. Any medications not marked as "taking" were not mentioned during the medication reconciliation part of the visit. Any vitals not documented were not able to be obtained due to this being a telehealth visit or patient was unable to self-report a recent blood pressure reading due to a lack of equipment at home via telehealth. Vitals that have been documented are verbally provided by the patient.   Patients daughter [name] assisted with the visit.  Subjective:   Amber [name] is a 77 y.o. female who presents for Medicare Annual (Subsequent) preventive examination.  Visit Complete: Virtual I connected with  Clarita Leber on 03/04/23 by a audio enabled telemedicine application and verified that I am speaking with the correct person using two identifiers.  Patient Location: Home  Provider Location: Home Office  I discussed the limitations of evaluation and management by telemedicine. The patient expressed understanding and agreed to proceed.  Vital Signs: Because this visit was a virtual/telehealth visit, some criteria may be missing or patient reported. Any vitals not documented were not able to be obtained and vitals that have been documented are patient reported.  Patient Medicare AWV questionnaire was completed by the patient on n/a; I have confirmed that all information answered by patient is correct and no changes since this date.  Cardiac Risk Factors include: advanced age (>91men, >46 women);dyslipidemia;hypertension;sedentary lifestyle     Objective:    Today's Vitals   03/04/23 0911  Weight: 170 lb (77.1 kg)  Height: 5\' 6"  (1.676 m)  PainSc: 5    Body mass index is 27.44 kg/m.     03/04/2023    9:10 AM 06/14/2022   10:30 AM 01/14/2022    9:16 AM 01/07/2022   11:45 AM 12/12/2021    8:06 PM 12/12/2021    3:12 PM 10/23/2021     2:56 PM  Advanced Directives  Does Patient Have a Medical Advance Directive? Yes No Yes Yes Yes Yes Yes  Type of Estate agent of [place];Living will  Healthcare Power of [place];Living will Healthcare Power of [place];Living will Healthcare Power of [place];Living will Healthcare Power of [place];Living will   Does patient want to make changes to medical advance directive?   No - Patient declined  No - Patient declined    Copy of Healthcare Power of Attorney in Chart? No - copy requested  No - copy requested Yes - validated most recent copy scanned in chart (See row information) No - copy requested    Would patient like information on creating a medical advance directive?  No - Patient declined         Current Medications (verified) Outpatient Encounter Medications as of 03/04/2023  Medication Sig   acetaminophen (TYLENOL) 500 MG tablet Take 500 mg by mouth every 6 (six) hours as needed for moderate pain or mild pain.   ARIPiprazole (ABILIFY) 5 MG tablet Take 5 mg by mouth at bedtime.   escitalopram (LEXAPRO) 20 MG tablet Take 20 mg by mouth daily.   ferrous sulfate 325 (65 FE) MG tablet Take 1 tablet (325 mg total) by mouth daily with breakfast.   methimazole (TAPAZOLE) 5 MG tablet TAKE 1/2 TABLET BY MOUTH ONCE DAILY.   mirtazapine (REMERON) 15 MG tablet Take 1 tablet (15 mg total) by mouth at bedtime.   Multiple Vitamin (MULTIVITAMIN WITH MINERALS) TABS tablet Take  1 tablet by mouth daily.   OVER THE COUNTER MEDICATION Stool softner as needed   polyethylene glycol powder (GLYCOLAX/MIRALAX) 17 GM/SCOOP powder Take 17 g by mouth as needed for mild constipation.   No facility-administered encounter medications on file as of 03/04/2023.    Allergies (verified) Fish allergy, Shellfish allergy, and Valium [diazepam]   History: Past Medical History:  Diagnosis Date   ABLA (acute blood loss anemia) 06/19/2021   Anxiety    Aortic atherosclerosis (HCC)     Aspiration pneumonia of right lung due to gastric secretions (HCC)    C. difficile colitis 07/13/2021   COVID-19 virus infection 06/08/2021    Depression    DVT of leg (deep venous thrombosis) (HCC) 05/2021   Dysphagia    Failure to thrive in adult    GERD (gastroesophageal reflux disease)    History of kidney stones    right ureter   Hypertension    Hyperthyroidism    Kidney disease    Pulmonary embolism (HCC) 05/2021   Stroke [facility])    showed up in MRI   Thoracic aortic aneurysm [facility])    Urinary incontinence    Past Surgical History:  Procedure Laterality Date   BIOPSY  06/14/2022   Procedure: BIOPSY;  Surgeon: Dolores Frame, MD;  [facility];  Service: Gastroenterology;;   COLONOSCOPY WITH PROPOFOL N/A 06/14/2022   Procedure: COLONOSCOPY WITH PROPOFOL;  Surgeon: Dolores Frame, MD;  [facility];  Service: Gastroenterology;  Laterality: N/A;  11:00am, asa 3   CYSTOSCOPY W/ URETERAL STENT PLACEMENT Right 12/12/2021   Procedure: CYSTOSCOPY WITH RETROGRADE PYELOGRAM/URETERAL STENT PLACEMENT;  Surgeon: Vanna Scotland, MD;  Location: [facility];  Service: Urology;  Laterality: Right;   CYSTOSCOPY/URETEROSCOPY/HOLMIUM LASER/STENT PLACEMENT Right 01/14/2022   Procedure: CYSTOSCOPY/URETEROSCOPY/HOLMIUM LASER/STENT EXCHANGE;  Surgeon: Vanna Scotland, MD;  Location: [facility];  Service: Urology;  Laterality: Right;   ESOPHAGOGASTRODUODENOSCOPY (EGD) WITH PROPOFOL N/A 06/14/2022   Procedure: ESOPHAGOGASTRODUODENOSCOPY (EGD) WITH PROPOFOL;  Surgeon: Dolores Frame, MD;  [facility];  Service: Gastroenterology;  Laterality: N/A;   POLYPECTOMY  06/14/2022   Procedure: POLYPECTOMY;  Surgeon: Marguerita Merles, Reuel Boom, MD;  [facility];  Service: Gastroenterology;;   Family History  Problem Relation Age of Onset   High blood pressure Maternal Grandmother    Social History   Socioeconomic History   Marital status:  Married    Spouse name: Fayrene Fearing   Number of children: 5   Years of education: Not on file   Highest education level: Not on file  Occupational History   Not on file  Tobacco Use   Smoking status: Former    Current packs/day: 0.00    Types: Cigarettes    Quit date: 05/27/2018    Years since quitting: 4.7   Smokeless tobacco: Never  Vaping Use   Vaping status: Never Used  Substance and Sexual Activity   Alcohol use: Not Currently   Drug use: Not Currently   Sexual activity: Not Currently  Other Topics Concern   Not on file  Social History Narrative   Right handed    one story home    lives with family    Doesn't work currently   Social Determinants of Health   Financial Resource Strain: Low Risk  (03/04/2023)   Overall Financial Resource Strain (CARDIA)    Difficulty of Paying Living Expenses: Not hard at all  Food Insecurity: No Food Insecurity (03/04/2023)   Hunger Vital Sign  Worried About Programme researcher, broadcasting/film/video in the Last Year: Never true    Ran Out of Food in the Last Year: Never true  Transportation Needs: No Transportation Needs (03/04/2023)   PRAPARE - Administrator, Civil Service (Medical): No    Lack of Transportation (Non-Medical): No  Physical Activity: Patient Declined (03/04/2023)   Exercise Vital Sign    Days of Exercise per Week: Patient declined    Minutes of Exercise per Session: Patient declined  Stress: Stress Concern Present (03/04/2023)   [employer] of Occupational Health - Occupational Stress Questionnaire    Feeling of Stress : Very much  Social Connections: Moderately Isolated (03/04/2023)   Social Connection and Isolation Panel [NHANES]    Frequency of Communication with Friends and Family: More than three times a week    Frequency of Social Gatherings with Friends and Family: Patient unable to answer    Attends Religious Services: Never    Database administrator or Organizations: No    Attends Engineer, structural: Never     Marital Status: Married    Tobacco Counseling Counseling given: Yes   Clinical Intake:  Pre-visit preparation completed: Yes  Pain : 0-10 Pain Score: 5  Pain Type: Chronic pain Pain Location: Back Pain Orientation: Lower Pain Descriptors / Indicators: Aching, Constant Pain Onset: More than a month ago Pain Frequency: Constant     BMI - recorded: 27.44 Nutritional Status: BMI 25 -29 Overweight Nutritional Risks: None Diabetes: No  How often do you need to have someone help you when you read instructions, pamphlets, or other written materials from your doctor or pharmacy?: 1 - Never  Interpreter Needed?: No  Information entered by ::  Moira Umholtz, CMA   Activities of Daily Living    03/04/2023    9:26 AM  In your present state of health, do you have any difficulty performing the following activities:  Hearing? 0  Vision? 0  Comment does not have an eye doctor. referral placed for patient today  Difficulty concentrating or making decisions? 0  Walking or climbing stairs? 1  Comment uses walker  Dressing or bathing? 1  Comment needs assistance  Doing errands, shopping? 1  Comment daughter takes her where she needs to go  Quarry manager and eating ? Y  Using the Toilet? N  In the past six months, have you accidently leaked urine? Y  Do you have problems with loss of bowel control? N  Managing your Medications? N  Managing your Finances? Y  Housekeeping or managing your Housekeeping? Y    Patient Care Team: [name], MD as PCP - General (Internal Medicine) Napoleon Form, MD as Consulting Physician (Gastroenterology)  Indicate any recent Medical Services you may have received from other than [facility] providers in the past year (date may be approximate).     Assessment:   This is a routine wellness examination for [place].  Hearing/Vision screen Hearing Screening - Comments:: Patient denies any hearing difficulties.   Vision Screening -  Comments:: Referral placed for patient and she is in agreement with plan    Goals Addressed             This Visit's Progress    Patient Stated       To get back to myself so that I can do the things for myself that I used to be able to       Depression Screen    03/04/2023  9:17 AM 02/06/2023    2:51 PM 08/08/2022    2:36 PM 03/25/2022   11:00 AM 02/06/2022    3:26 PM 01/16/2022    9:19 AM 12/31/2021   10:22 AM  PHQ 2/9 Scores  PHQ - 2 Score 6 6 6 3   4   PHQ- 9 Score 15 19 19 13   4   Exception Documentation     Other- indicate reason in comment box       Information is confidential and restricted. Go to Review Flowsheets to unlock data.    Fall Risk    03/04/2023    9:25 AM 02/06/2023    2:50 PM 08/08/2022    2:35 PM 03/25/2022   11:00 AM 02/06/2022    3:26 PM  Fall Risk   Falls in the past year? 0 0 0 0 0  Number falls in past yr: 0  0 0 0  Injury with Fall? 0  0 0 0  Risk for fall due to : Impaired balance/gait;Orthopedic patient;Impaired mobility   No Fall Risks No Fall Risks  Follow up Education provided;Falls prevention discussed;Falls evaluation completed   Falls evaluation completed Falls evaluation completed    MEDICARE RISK AT HOME: Medicare Risk at Home Any stairs in or around the home?: Yes If so, are there any without handrails?: No Home free of loose throw rugs in walkways, pet beds, electrical cords, etc?: Yes Adequate lighting in your home to reduce risk of falls?: Yes Life alert?: No Use of a cane, walker or w/c?: Yes Grab bars in the bathroom?: Yes Shower chair or bench in shower?: Yes Elevated toilet seat or a handicapped toilet?: Yes  TIMED UP AND GO:  Was the test performed?  No    Cognitive Function:    06/25/2021    3:19 PM  MMSE - Mini Mental State Exam  Not completed:      Information is confidential and restricted. Go to Review Flowsheets to unlock data.      02/13/2023    3:12 PM  [place] Cognitive Assessment    Visuospatial/ Executive (0/5) 2  Naming (0/3) 2  Attention: Read list of digits (0/2) 1  Attention: Read list of letters (0/1) 1  Attention: Serial 7 subtraction starting at 100 (0/3) 3  Language: Repeat phrase (0/2) 0  Language : Fluency (0/1) 1  Abstraction (0/2) 2  Delayed Recall (0/5) 2  Orientation (0/6) 6  Total 20      03/04/2023    9:15 AM  6CIT Screen  What Year? 0 points  What month? 0 points  What time? 0 points  Count back from 20 0 points  Months in reverse 0 points  Repeat phrase 0 points  Total Score 0 points    Immunizations  There is no immunization history on file for this patient.  TDAP status: Due, Education has been provided regarding the importance of this vaccine. Advised may receive this vaccine at local pharmacy or Health Dept. Aware to provide a copy of the vaccination record if obtained from local pharmacy or Health Dept. Verbalized acceptance and understanding.  Flu Vaccine status: Due, Education has been provided regarding the importance of this vaccine. Advised may receive this vaccine at local pharmacy or Health Dept. Aware to provide a copy of the vaccination record if obtained from local pharmacy or Health Dept. Verbalized acceptance and understanding.  Pneumococcal vaccine status: Due, Education has been provided regarding the importance of this vaccine. Advised may receive this vaccine at  local pharmacy or Health Dept. Aware to provide a copy of the vaccination record if obtained from local pharmacy or Health Dept. Verbalized acceptance and understanding.  Covid-19 vaccine status: Completed vaccines  Qualifies for Shingles Vaccine? Yes   Zostavax completed No   Shingrix Completed?: No.    Education has been provided regarding the importance of this vaccine. Patient has been advised to call insurance company to determine out of pocket expense if they have not yet received this vaccine. Advised may also receive vaccine at local pharmacy or  Health Dept. Verbalized acceptance and understanding.  Screening Tests Health Maintenance  Topic Date Due   Medicare Annual Wellness (AWV)  Never done   DTaP/Tdap/Td (1 - Tdap) Never done   Zoster Vaccines- Shingrix (1 of 2) Never done   Pneumonia Vaccine 18+ Years old (1 of 1 - PCV) Never done   DEXA SCAN  Never done   INFLUENZA VACCINE  Never done   Hepatitis C Screening  Completed   HPV VACCINES  Aged Out   Colonoscopy  Discontinued   COVID-19 Vaccine  Discontinued    Health Maintenance  Health Maintenance Due  Topic Date Due   Medicare Annual Wellness (AWV)  Never done   DTaP/Tdap/Td (1 - Tdap) Never done   Zoster Vaccines- Shingrix (1 of 2) Never done   Pneumonia Vaccine 39+ Years old (1 of 1 - PCV) Never done   DEXA SCAN  Never done   INFLUENZA VACCINE  Never done    Colorectal cancer screening: No longer required.   Mammogram Status: Patient declined breast cancer screening   Bone Density status: Ordered 9/12/20244. Pt provided with contact info and advised to call to schedule appt.  Lung Cancer Screening: (Low Dose CT Chest recommended if Age 68-80 years, 20 pack-year currently smoking OR have quit w/in 15years.) does not qualify.   Lung Cancer Screening Referral: na  Additional Screening:  Hepatitis C Screening: does not qualify; Completed 03/19/2022  Vision Screening: Recommended annual ophthalmology exams for early detection of glaucoma and other disorders of the eye. Is the patient up to date with their annual eye exam?  No  Who is the provider or what is the name of the office in which the patient attends annual eye exams? Na  If pt is not established with a provider, would they like to be referred to a provider to establish care? Yes .   Dental Screening: Recommended annual dental exams for proper oral hygiene  Diabetic Foot Exam: na  Community Resource Referral / Chronic Care Management: CRR required this visit?  Yes   CCM required this visit?   No     Plan:     I have personally reviewed and noted the following in the patient's chart:   Medical and social history Use of alcohol, tobacco or illicit drugs  Current medications and supplements including opioid prescriptions. Patient is not currently taking opioid prescriptions. Functional ability and status Nutritional status Physical activity Advanced directives List of other physicians Hospitalizations, surgeries, and ER visits in previous 12 months Vitals Screenings to include cognitive, depression, and falls Referrals and appointments  In addition, I have reviewed and discussed with patient certain preventive protocols, quality metrics, and best practice recommendations. A written personalized care plan for preventive services as well as general preventive health recommendations were provided to patient.     Jordan Hawks Kandra Graven, CMA   03/04/2023   After Visit Summary: (MyChart) Due to this being a telephonic visit, the after  visit summary with patients personalized plan was offered to patient via MyChart   Nurse Notes:

## 2023-03-05 ENCOUNTER — Encounter: Payer: Self-pay | Admitting: Urology

## 2023-03-07 ENCOUNTER — Telehealth: Payer: Self-pay | Admitting: *Deleted

## 2023-03-07 NOTE — Progress Notes (Signed)
Care Coordination   Note   03/07/2023 Name: Amber [name] MRN: [id number] DOB: Jan 28, 1946  Amber [name] is a 77 y.o. year old female who sees [name], MD for primary care. I reached out to Clarita Leber by phone today to offer care coordination services.  Ms. Kijek was given information about Care Coordination services today including:   The Care Coordination services include support from the care team which includes your Nurse Coordinator, Clinical Social Worker, or Pharmacist.  The Care Coordination team is here to help remove barriers to the health concerns and goals most important to you. Care Coordination services are voluntary, and the patient may decline or stop services at any time by request to their care team member.   Care Coordination Consent Status: Patient did not agree to participate in care coordination services at this time.    Encounter Outcome: patient daughter declined [facility] Coordination Care Guide  Direct Dial: [phone]

## 2023-03-10 ENCOUNTER — Ambulatory Visit (INDEPENDENT_AMBULATORY_CARE_PROVIDER_SITE_OTHER): Payer: Medicare Other | Admitting: Gastroenterology

## 2023-03-10 ENCOUNTER — Encounter (INDEPENDENT_AMBULATORY_CARE_PROVIDER_SITE_OTHER): Payer: Self-pay

## 2023-03-12 ENCOUNTER — Ambulatory Visit
Admission: RE | Admit: 2023-03-12 | Discharge: 2023-03-12 | Disposition: A | Discharge status: Home / Self Care | Payer: Medicare Other | Source: Ambulatory Visit | Attending: Urology | Admitting: Urology

## 2023-03-12 ENCOUNTER — Other Ambulatory Visit: Payer: Medicare Other | Admitting: *Deleted

## 2023-03-12 ENCOUNTER — Ambulatory Visit
Admission: RE | Admit: 2023-03-12 | Discharge: 2023-03-12 | Disposition: A | Discharge status: Home / Self Care | Payer: Medicare Other | Attending: Urology | Admitting: Urology

## 2023-03-12 ENCOUNTER — Other Ambulatory Visit: Payer: Self-pay | Admitting: *Deleted

## 2023-03-12 ENCOUNTER — Ambulatory Visit: Payer: Medicare Other | Admitting: Urology

## 2023-03-12 VITALS — BP 180/91 | HR 62 | Ht 66.0 in | Wt 173.0 lb

## 2023-03-12 DIAGNOSIS — K5909 Other constipation: Secondary | ICD-10-CM

## 2023-03-12 DIAGNOSIS — Z87442 Personal history of urinary calculi: Secondary | ICD-10-CM

## 2023-03-12 DIAGNOSIS — N201 Calculus of ureter: Secondary | ICD-10-CM | POA: Diagnosis present

## 2023-03-12 DIAGNOSIS — N3941 Urge incontinence: Secondary | ICD-10-CM | POA: Diagnosis not present

## 2023-03-12 DIAGNOSIS — K5900 Constipation, unspecified: Secondary | ICD-10-CM

## 2023-03-12 MED ORDER — GEMTESA 75 MG PO TABS: 1.0000 | ORAL_TABLET | Freq: Once | ORAL | Status: AC

## 2023-03-12 NOTE — Progress Notes (Signed)
Marcelle Overlie Plume,acting as a scribe for Vanna Scotland, MD.,have documented all relevant documentation on the behalf of Vanna Scotland, MD,as directed by  Vanna Scotland, MD while in the presence of Vanna Scotland, MD.  03/12/2023 1:55 PM   Amber [name] Feb 23, 1946 [id number]  Referring provider: [name], MD [address] [place],  [place] [address]  Chief Complaint  Patient presents with   Hydronephrosis    HPI: 77 year-old female who presents today for a routine annual follow up.   She has a personal history of kidney stones and underwent a staged procedure for right ureteral calculi x2 initially presenting with sepsis-like picture. Notably, she does also have chronic right hydronephrosis.   Her follow up renal ultrasound completed on 06/25/2022 showed some mild right pelvic prominence but no overt hydronephrosis.   She presents today with a KUB, which I am personally reviewing today and comparing it to her previous CT scan back in 11/2021.   She also mentioned at her last visit that she was having some urinary urgency and urge incontinence. She reports nightly accidents. We discussed starting a beta-3 agonist given concern for mental cognitive status. They were going to let us know if she wanted to pursue any of that and there was no further communication. She is not currently taking either of these medications.  Her daughter reports over the last 6 months for more, symptoms have worsened and increasingly bothersome.   In terms of hydration, she drinks primarily Sprite, tea, and lemonade.    PMH: Past Medical History:  Diagnosis Date   ABLA (acute blood loss anemia) 06/19/2021   Anxiety    Aortic atherosclerosis (HCC)    Aspiration pneumonia of right lung due to gastric secretions (HCC)    C. difficile colitis 07/13/2021   COVID-19 virus infection 06/08/2021    Depression    DVT of leg (deep venous thrombosis) (HCC) 05/2021   Dysphagia    Failure to thrive in adult     GERD (gastroesophageal reflux disease)    History of kidney stones    right ureter   Hypertension    Hyperthyroidism    Kidney disease    Pulmonary embolism (HCC) 05/2021   Stroke [facility])    showed up in MRI   Thoracic aortic aneurysm [facility])    Urinary incontinence     Surgical History: Past Surgical History:  Procedure Laterality Date   BIOPSY  06/14/2022   Procedure: BIOPSY;  Surgeon: Dolores Frame, MD;  [facility];  Service: Gastroenterology;;   COLONOSCOPY WITH PROPOFOL N/A 06/14/2022   Procedure: COLONOSCOPY WITH PROPOFOL;  Surgeon: Dolores Frame, MD;  [facility]: [facility];  Service: Gastroenterology;  Laterality: N/A;  11:00am, asa 3   CYSTOSCOPY W/ URETERAL STENT PLACEMENT Right 12/12/2021   Procedure: CYSTOSCOPY WITH RETROGRADE PYELOGRAM/URETERAL STENT PLACEMENT;  Surgeon: Vanna Scotland, MD;  Location: [facility];  Service: Urology;  Laterality: Right;   CYSTOSCOPY/URETEROSCOPY/HOLMIUM LASER/STENT PLACEMENT Right 01/14/2022   Procedure: CYSTOSCOPY/URETEROSCOPY/HOLMIUM LASER/STENT EXCHANGE;  Surgeon: Vanna Scotland, MD;  Location: [facility];  Service: Urology;  Laterality: Right;   ESOPHAGOGASTRODUODENOSCOPY (EGD) WITH PROPOFOL N/A 06/14/2022   Procedure: ESOPHAGOGASTRODUODENOSCOPY (EGD) WITH PROPOFOL;  Surgeon: Dolores Frame, MD;  [facility]: [facility];  Service: Gastroenterology;  Laterality: N/A;   POLYPECTOMY  06/14/2022   Procedure: POLYPECTOMY;  Surgeon: Dolores Frame, MD;  [facility]: [facility];  Service: Gastroenterology;;    Home Medications:  Allergies as of 03/12/2023  Reactions   Fish Allergy    Does not eat fish   Shellfish Allergy    "I pass out"   Valium [diazepam] Other (See Comments)   Makes her want to " fly"        Medication List        Accurate as of March 12, 2023  1:55 PM. If you have any questions, ask your nurse or doctor.          acetaminophen 500 MG  tablet Commonly known as: TYLENOL Take 500 mg by mouth every 6 (six) hours as needed for moderate pain or mild pain.   ARIPiprazole 5 MG tablet Commonly known as: ABILIFY Take 5 mg by mouth at bedtime.   escitalopram 20 MG tablet Commonly known as: LEXAPRO Take 20 mg by mouth daily.   ferrous sulfate 325 (65 FE) MG tablet Take 1 tablet (325 mg total) by mouth daily with breakfast.   Gemtesa 75 MG Tabs Generic drug: Vibegron Take 1 tablet (75 mg total) by mouth once for 1 dose. Started by: Vanna Scotland   methimazole 5 MG tablet Commonly known as: TAPAZOLE TAKE 1/2 TABLET BY MOUTH ONCE DAILY.   mirtazapine 15 MG tablet Commonly known as: REMERON Take 1 tablet (15 mg total) by mouth at bedtime.   multivitamin with minerals Tabs tablet Take 1 tablet by mouth daily.   OVER THE COUNTER MEDICATION Stool softner as needed   polyethylene glycol powder 17 GM/SCOOP powder Commonly known as: GLYCOLAX/MIRALAX Take 17 g by mouth as needed for mild constipation.        Allergies:  Allergies  Allergen Reactions   Fish Allergy     Does not eat fish   Shellfish Allergy     "I pass out"   Valium [Diazepam] Other (See Comments)    Makes her want to " fly"    Family History: Family History  Problem Relation Age of Onset   High blood pressure Maternal Grandmother     Social History:  reports that she quit smoking about 4 years ago. Her smoking use included cigarettes. She has never used smokeless tobacco. She reports that she does not currently use alcohol. She reports that she does not currently use drugs.   Physical Exam: BP (!) 180/91   Pulse 62   Ht 5\' 6"  (1.676 m)   Wt 173 lb (78.5 kg)   BMI 27.92 kg/m   Constitutional:  Alert and oriented, No acute distress. HEENT: [place] AT, moist mucus membranes.  Trachea midline, no masses. Neurologic: Grossly intact, no focal deficits, moving all 4 extremities. Psychiatric: Normal mood and affect.  Pertinent Imaging:  KUB  performed today. Radiologic interpretation is pending. I personally reviewed this imaging. There is no significant stone burden visualized. She does have multiple pelvic calcifications, which appear to be stable, representing pelvic phleboliths. No obvious ureteral disease.    EXAM: CT ABDOMEN AND PELVIS WITH CONTRAST   TECHNIQUE: Multidetector CT imaging of the abdomen and pelvis was performed using the standard protocol following bolus administration of intravenous contrast.   RADIATION DOSE REDUCTION: This exam was performed according to the departmental dose-optimization program which includes automated exposure control, adjustment of the mA and/or kV according to patient size and/or use of iterative reconstruction technique.   CONTRAST:  75mL OMNIPAQUE IOHEXOL 300 MG/ML  SOLN   COMPARISON:  June 14, 2021   FINDINGS: Lower chest: Enlarged heart. Calcific atherosclerotic disease of the coronary arteries.   Hepatobiliary: Left liver cyst. Normal  appearance of the gallbladder.   Pancreas: Unremarkable. No pancreatic ductal dilatation or surrounding inflammatory changes.   Spleen: Normal in size without focal abnormality.   Adrenals/Urinary Tract: Normal adrenal glands. Partially exophytic left renal cyst, stable. Again seen is obstructing ureteral stone in the right UPJ measuring 1.1 cm. A second 8 mm calculus is located within the proximal right ureter slightly distal to the UPJ where the ureter has a reverse S shaped configuration. There is right perinephric fat stranding.   Stomach/Bowel: Stomach is within normal limits. Appendix appears normal. No evidence of bowel wall thickening, distention, or inflammatory changes.   Vascular/Lymphatic: Aortic atherosclerosis. No enlarged abdominal or pelvic lymph nodes.   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 Coarse calcifications within the right adnexa.   Other: No abdominal wall hernia or abnormality. No abdominopelvic ascites.   Musculoskeletal: No acute or significant osseous findings.   IMPRESSION: 1. Two obstructing ureteral calculi in the right UPJ and proximal right ureter, with reverse S shaped configuration of the proximal right ureter. Resultant severe right hydronephrosis and right perinephric stranding. 2. 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 3. Enlarged heart. 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   Aortic Atherosclerosis (ICD10-I70.0).     Electronically Signed   By: Ted Mcalpine M.D.   On: 12/12/2021 16:40  This was personally reviewed and I agree with the radiologic interpretation.   Assessment & Plan:    1. History of nephrolithiasis - No new stones observed on imaging.  - Continue monitoring with KUB  - encourage continue focus on hydration, primarily water or citrate containing food  2. Overactive bladder/ Urge incontinence - Start Gemtesa 76 mg samples for one month. Evaluate effectiveness and check insurance formulary for coverage of beta-3 agonists.  - Consider Myrbetriq if Leslye Peer is not effective or covered  3. Constipation - Likely a contributing factor - Discussed management with Miralax   Return in about 1 year (around 03/11/2024) for 1 year follow up .  I have reviewed the above documentation for accuracy and completeness, and I agree with the above.   Vanna Scotland, MD    [facility] [address], [address] [place], [place] [address] [phone]

## 2023-03-12 NOTE — Patient Instructions (Addendum)
Medication   Gemtesa  Mybrbetriq

## 2023-03-12 NOTE — Progress Notes (Unsigned)
Amber [name] presents for an office visit. BP today is __180/91_________. She is not on BP medication. Greater than 140/90. Provider  notified. Pt advised to__f/u with PCP____________. Pt voiced understanding.

## 2023-03-13 ENCOUNTER — Ambulatory Visit (INDEPENDENT_AMBULATORY_CARE_PROVIDER_SITE_OTHER): Payer: Medicare Other | Admitting: Neurology

## 2023-03-13 DIAGNOSIS — R413 Other amnesia: Secondary | ICD-10-CM

## 2023-03-13 DIAGNOSIS — R4182 Altered mental status, unspecified: Secondary | ICD-10-CM

## 2023-03-20 ENCOUNTER — Ambulatory Visit
Admission: RE | Admit: 2023-03-20 | Discharge: 2023-03-20 | Disposition: A | Discharge status: Home / Self Care | Payer: Medicare Other | Source: Ambulatory Visit | Attending: Neurology | Admitting: Neurology

## 2023-03-20 DIAGNOSIS — R413 Other amnesia: Secondary | ICD-10-CM | POA: Diagnosis not present

## 2023-03-28 ENCOUNTER — Encounter: Payer: Self-pay | Admitting: Neurology

## 2023-04-05 NOTE — Procedures (Signed)
   HISTORY: 77 year old female, presenting with late onset memory loss, personality change  TECHNIQUE:  This is a routine 16 channel EEG recording with one channel devoted to a limited EKG recording.  It was performed during wakefulness, drowsiness and asleep.  Hyperventilation and photic stimulation were performed as activating procedures.  There are minimum muscle and movement artifact noted.  Upon maximum arousal, posterior dominant waking rhythm consistent of mildly dysrhythmic mixed alpha and theta range activity. Activities are symmetric over the bilateral posterior derivations and attenuated with eye opening.  Anterior rhythm consists of dysrhythmic  theta range activity involving bilateral frontal parietal lobe.  Photic stimulation did not alter the tracing.  Hyperventilation produced mild/moderate buildup with higher amplitude and the slower activities noted.  During EEG recording, patient developed drowsiness and no deeper stage of sleep was achieved  During EEG recording, there was no epileptiform discharge noted.  EKG demonstrate normal sinus rhythm.  CONCLUSION: This is an abnormal EEG.  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  Levert Feinstein, M.D. Ph.D.  [facility] [address] [place], [place] [address] Phone: [phone] Fax:      [phone]

## 2023-04-30 IMAGING — CT CT RENAL STONE PROTOCOL
2 of 4 series · 16 of 46 positions shown, 18 images · non-contrast
Comparison: None.

CLINICAL DATA: Left flank pain and urgency.

EXAM:
CT ABDOMEN AND PELVIS WITHOUT CONTRAST
TECHNIQUE: Multidetector CT imaging of the abdomen and pelvis was performed
following the standard protocol without IV contrast.

[Series 2: axial st · axial · 0.78mm/px · z∈[+799,+1199]mm · 13 of 92 slices shown, 15 images]
[im 6/92  soft-tissue]
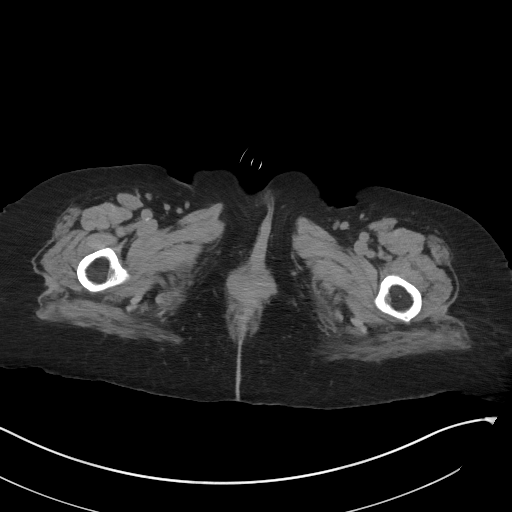
[im 6/92  bone]
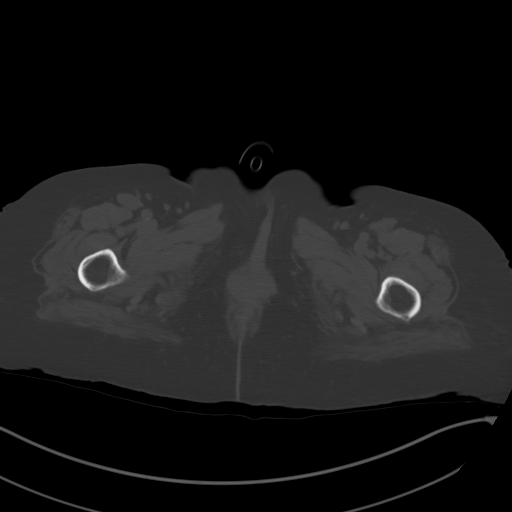
[im 11/92  soft-tissue]
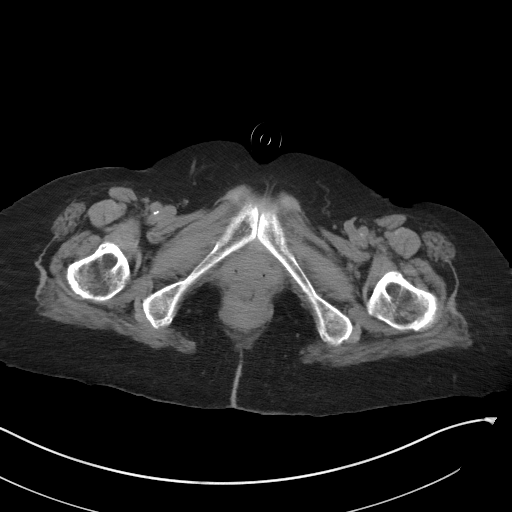
[im 21/92  soft-tissue]
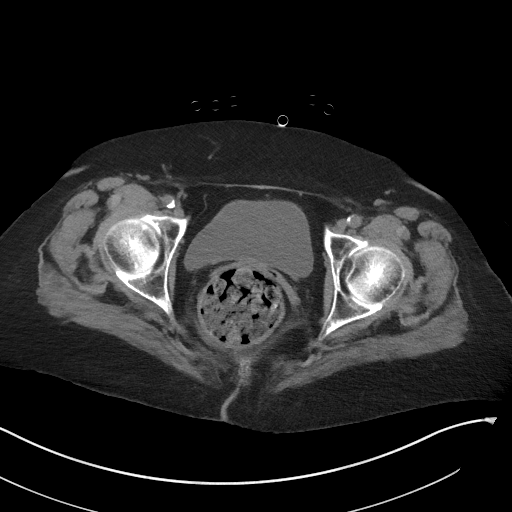
[im 26/92  soft-tissue]
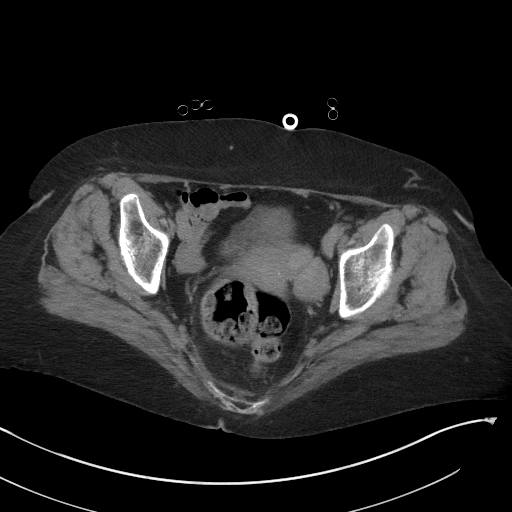
[im 31/92  soft-tissue]
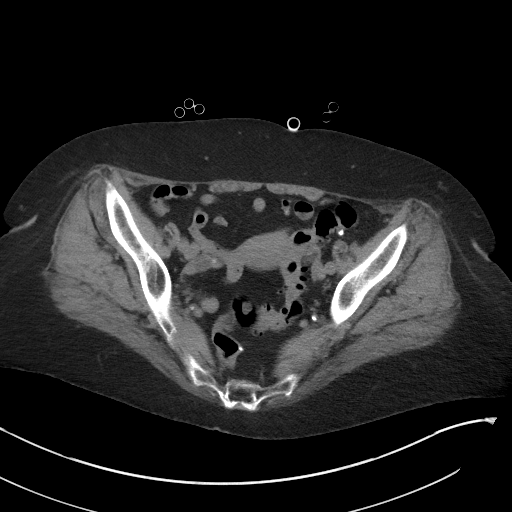
[im 41/92  soft-tissue]
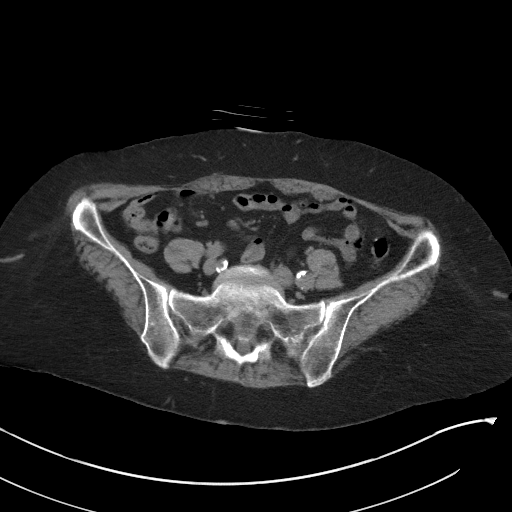
[im 46/92  soft-tissue]
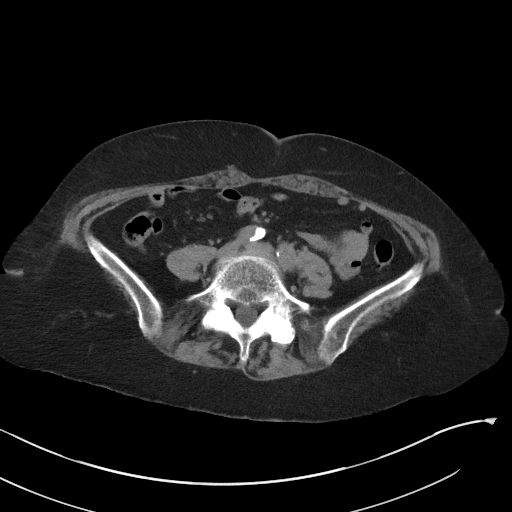
[im 51/92  soft-tissue]
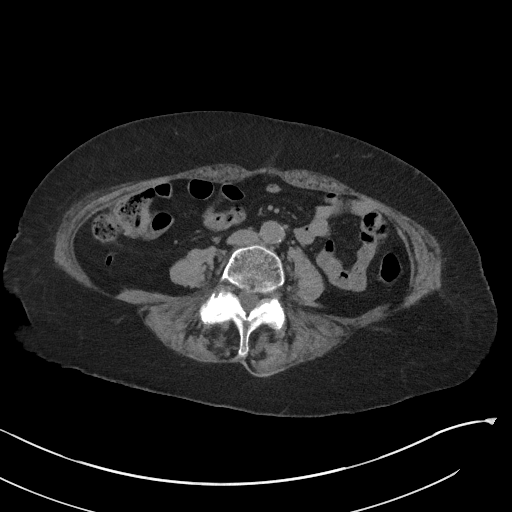
[im 61/92  soft-tissue]
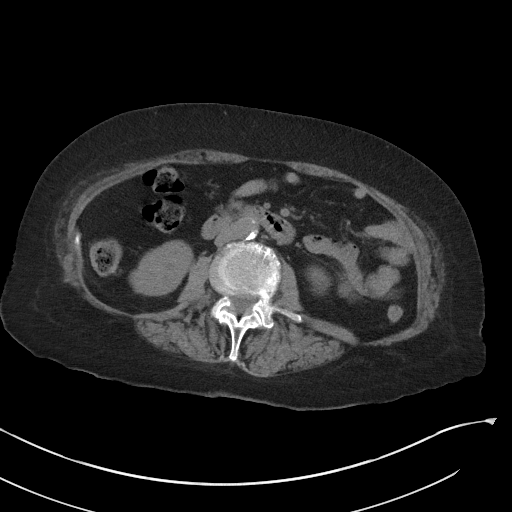
[im 61/92  bone]
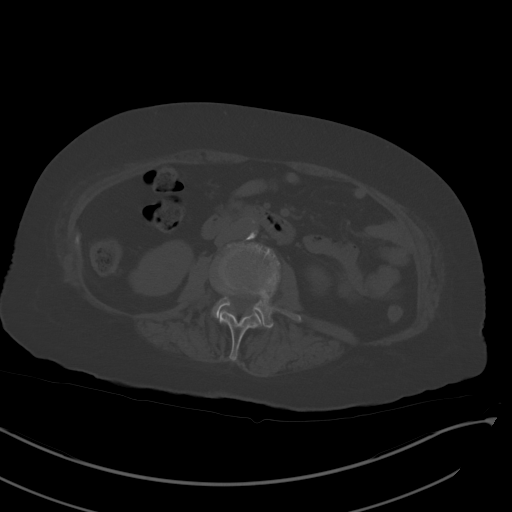
[im 66/92  soft-tissue]
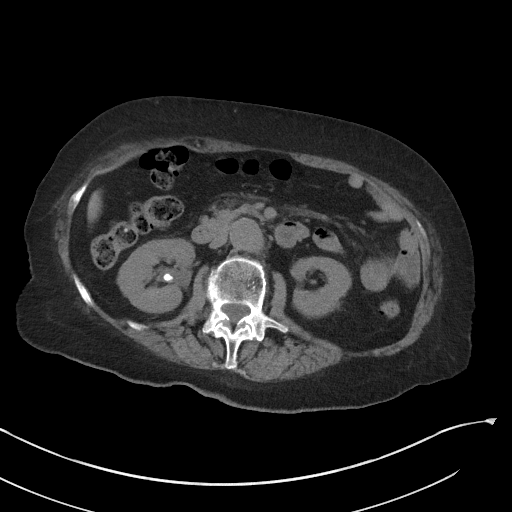
[im 71/92  soft-tissue]
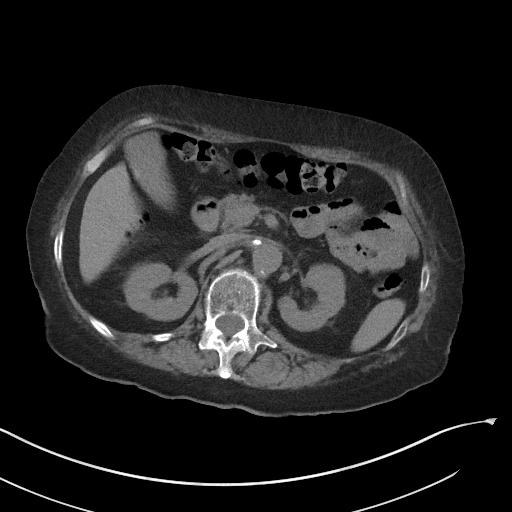
[im 81/92  soft-tissue]
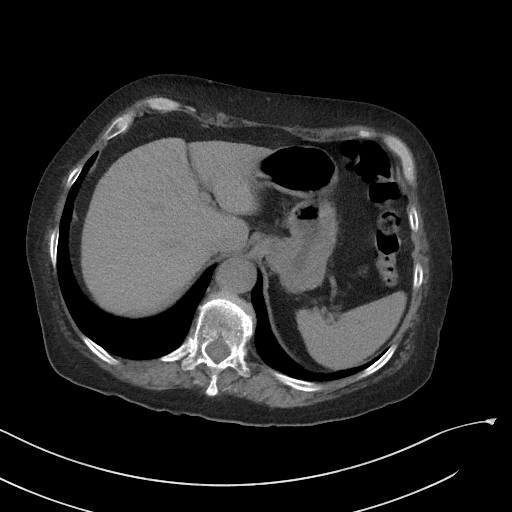
[im 86/92  soft-tissue]
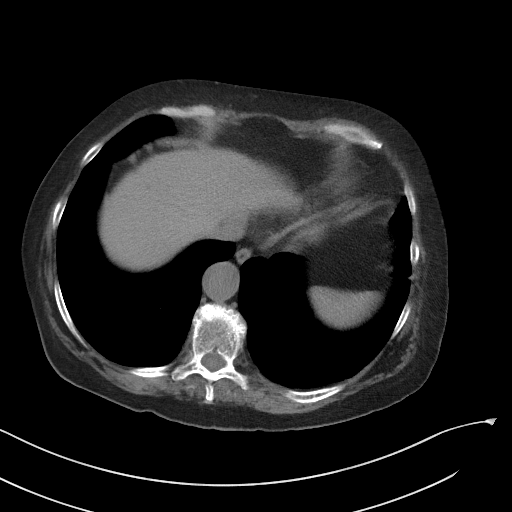

[Series 5: coronal st · coronal · 0.90mm/px · 3 of 94 slices shown]
[im 32/94  soft-tissue]
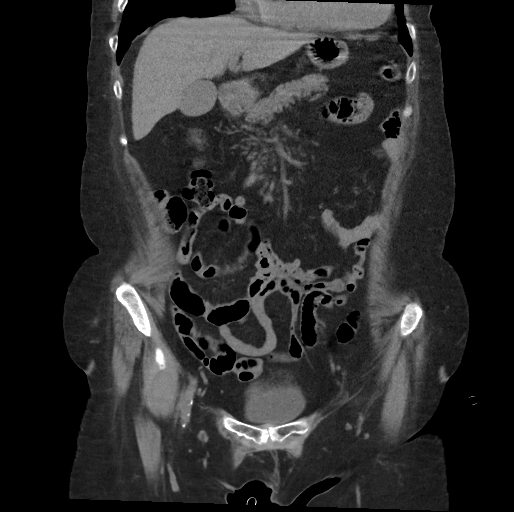
[im 42/94  soft-tissue]
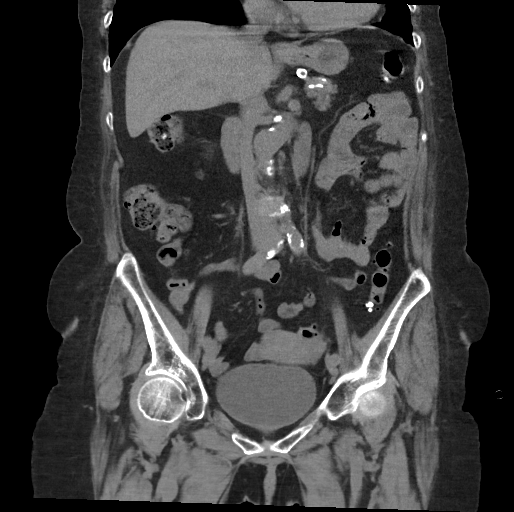
[im 52/94  soft-tissue]
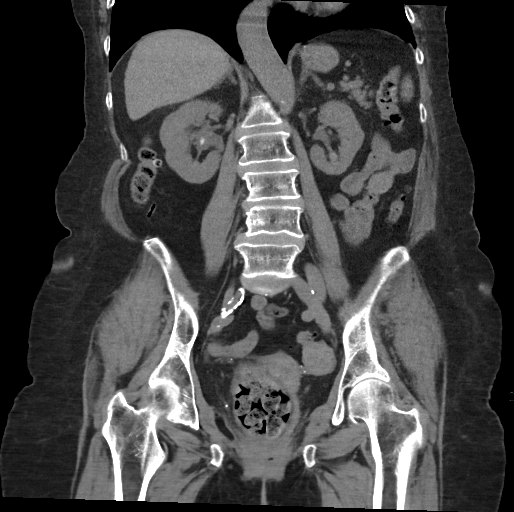

[16 of 46 positions shown; findings below may reference images not displayed]

FINDINGS: Lower chest: No acute abnormality.

Hepatobiliary: A 2.5 cm x 1.6 cm cyst is seen within the anterior
aspect of the left lobe of the liver. This is limited in evaluation
in the absence of intravenous contrast. No gallstones, gallbladder
wall thickening, or biliary dilatation.

Pancreas: Unremarkable. No pancreatic ductal dilatation or
surrounding inflammatory changes.

Spleen: Normal in size without focal abnormality.

Adrenals/Urinary Tract: Adrenal glands are unremarkable. Kidneys are
normal in size without focal lesions. A cluster of 3 mm
nonobstructing renal stones is seen within the lower pole of the
left kidney. Adjacent 5 mm and 3 mm nonobstructing renal calculi are
seen within the dependent portion of the right renal pelvis. Mild
right-sided hydronephrosis is noted. Bladder is unremarkable.

Stomach/Bowel: Stomach is within normal limits. Appendix appears
normal. A moderate amount of stool is seen within the distal sigmoid
colon and rectum. No evidence of bowel wall thickening, distention,
or inflammatory changes. Noninflamed diverticula are seen throughout
the large bowel.

Vascular/Lymphatic: Aortic atherosclerosis. No enlarged abdominal or
pelvic lymph nodes.

Reproductive: The uterus is normal in size and appearance. A 2.6 cm
x 2.3 cm complex appearing cystic area is seen within the left
adnexa (axial CT image 66, CT series 2).

Other: No abdominal wall hernia or abnormality. No abdominopelvic
ascites.

Musculoskeletal: No acute or significant osseous findings.
IMPRESSION: 1. Mild right-sided hydronephrosis without evidence of obstructing
renal calculi.
2. Bilateral nonobstructing renal calculi.
3. Colonic diverticulosis.
4. 2.6 cm x 2.3 cm complex appearing cystic area within the left
adnexa. Recommend further evaluation with pelvic ultrasound.

Aortic Atherosclerosis ([id number]-FL5.5).

## 2023-05-08 ENCOUNTER — Ambulatory Visit (INDEPENDENT_AMBULATORY_CARE_PROVIDER_SITE_OTHER): Payer: Medicare Other | Admitting: Neurology

## 2023-05-08 ENCOUNTER — Encounter: Payer: Self-pay | Admitting: Neurology

## 2023-05-08 ENCOUNTER — Telehealth: Payer: Self-pay | Admitting: Neurology

## 2023-05-08 VITALS — BP 191/93 | HR 86 | Resp 15 | Ht 66.5 in

## 2023-05-08 DIAGNOSIS — R269 Unspecified abnormalities of gait and mobility: Secondary | ICD-10-CM | POA: Diagnosis not present

## 2023-05-08 DIAGNOSIS — R4189 Other symptoms and signs involving cognitive functions and awareness: Secondary | ICD-10-CM | POA: Diagnosis not present

## 2023-05-08 NOTE — Telephone Encounter (Signed)
[facility] is taking this patient 

## 2023-05-08 NOTE — Progress Notes (Signed)
Chief Complaint  Patient presents with   Memory Loss    Rm15, daughter present, [place]: Total 20 on September vist, here for imaging results      ASSESSMENT AND PLAN  Amber [name] is a 77 y.o. 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   MEDICAL HISTORY:  Amber [name] is a 77 year old female, seen in request by her primary Care physician Dr. Allena Katz, [place], for evaluation of memory loss, personality change, she is accompanied by her daughter [name] at today's visit February 13, 2023, also received information from compassionate care Dr. Rosalene Billings, [place],    I reviewed and summarized the referring note. PMHX. Depression, anxiety Hx of smoke, quit in 2020.   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 intake, worsening confusion, hallucinations, refused to eat, was seen by psychiatrist, determined her to not have capacity for medical decision-making, NG tube was placed for force-feeding,  She was treated with Zyprexa, completed ECT treatment, was seen by psychiatrist then, diagnosed with severe depressed with psychotic features,  Daughter reported some improvement with her symptoms with ECT treatment, but with frequent change of antidepression medications, patient still not back to her normal self, she is more forgetful, very clinging, wants somebody to be with her all the time, afraid to die, neglect personal hygiene, moved from bed to chair, to dining table, she can eat better now, but not motivated to do anything else  She was seen by Dr. Rosalene Billings, raised the possibility of frontotemporal dementia.  Today's MoCA examination 20/30  Personally reviewed MRI of the brain November 2022, no acute abnormality, mild atrophy, small vessel disease CT head July 2023 no acute abnormality,  Lab in 2024 showed normal free T3-T4, TSH, elevated PTH, calcium 10.3,  UPDATE May 08 2023: She is brought in by her daughter at visit, lives at home with her husband, daughter reported gradual change over the past couple years, no longer fulfill her daily routine, not cooking, spending a lot of time in bed, on  recliner, need prompts personal hygiene, taking a shower, gait abnormality, not active,  Reviewed MRI of the brain from October 2024, generalized atrophy mild small vessel disease, overall no significant change compared to previous MRI of the brain  Extensive laboratory evaluation showed elevated thyroglobulin level, but normal or negative thyroid peroxidase antibody, B12, RPR, autoimmune neurology panel, CMP CBC protein electrophoresis ANA ESR C-reactive protein,  elevated PTH, with mild elevation of calcium level  EEG showed intermittent slowing at bilateral frontal parietal  region,  We have talked about potential further investigation of her rapid decline, but patient interrupted conversation multiple times, does not want to proceed with further evaluation,  PHYSICAL EXAM:   Vitals:   05/08/23 1108  BP: (!) 191/93  Pulse: 86  Resp: 15  Height: 5' 6.5" (1.689 m)     Body mass index is 27.5 kg/m.  PHYSICAL EXAMNIATION:  Gen: NAD, conversant, well nourised, well groomed                     Cardiovascular: Regular rate rhythm, no peripheral edema, warm, nontender. Eyes: Conjunctivae clear without exudates or hemorrhage Neck: Supple, no carotid bruits. Pulmonary: Clear to auscultation bilaterally   NEUROLOGICAL EXAM:  MENTAL STATUS: Speech/cognition: Awake, alert, oriented to history taking and casual conversation    02/13/2023    3:12 PM  Montreal Cognitive Assessment   Visuospatial/ Executive (0/5) 2  Naming (0/3) 2  Attention: Read list of digits (0/2) 1  Attention: Read list of letters (0/1) 1  Attention: Serial 7 subtraction starting at 100 (0/3) 3  Language: Repeat phrase (0/2) 0  Language : Fluency (0/1) 1  Abstraction (0/2) 2  Delayed Recall (0/5) 2  Orientation (0/6) 6  Total 20    CRANIAL NERVES: CN II: Visual fields are full to confrontation. Pupils are round equal and briskly reactive to light. CN III, IV, VI: extraocular movement are normal. No ptosis. CN V: Facial sensation is intact to light touch CN VII: Face is symmetric with normal eye closure  CN VIII: Hearing is normal to causal conversation. CN IX, X: Phonation is normal. CN XI: Head turning and shoulder shrug are intact  MOTOR: No muscle weakness, mild bilateral upper extremity rigidity, mild left more than right hesitation on rapid rotating movement,  REFLEXES: Reflexes are 1 and symmetric at the biceps, triceps, knees, and ankles. Plantar responses are flexor.  SENSORY: Intact to light touch  COORDINATION: There is no trunk or limb dysmetria  noted.  GAIT/STANCE: Need push-up to get up from seated position, careful unsteady gait  REVIEW OF SYSTEMS:  Full 14 system review of systems performed and notable only for as above All other review of systems were negative.   ALLERGIES: Allergies  Allergen Reactions   Fish Allergy     Does not eat fish   Shellfish Allergy     "I pass out"   Valium [Diazepam] Other (See Comments)    Makes her want to " fly"    HOME MEDICATIONS: Current Outpatient Medications  Medication Sig Dispense Refill   acetaminophen (TYLENOL) 500 MG tablet Take 500 mg by mouth every 6 (six) hours as needed for moderate pain or mild pain.     ARIPiprazole (ABILIFY) 5 MG tablet Take 5 mg by mouth at bedtime.     escitalopram (LEXAPRO) 20 MG tablet Take 20 mg by mouth daily.     ferrous sulfate 325 (65 FE) MG tablet Take 1 tablet (325 mg total) by mouth daily with breakfast. 30 tablet 0   methimazole (TAPAZOLE) 5 MG tablet TAKE 1/2 TABLET BY MOUTH ONCE DAILY. 45 tablet 0   mirtazapine (REMERON) 15 MG tablet Take 1 tablet (15 mg total) by mouth at bedtime. 90 tablet 0   Multiple Vitamin (MULTIVITAMIN WITH MINERALS) TABS tablet Take 1 tablet by mouth daily. 30 tablet 0   OVER THE COUNTER MEDICATION Stool  softner as needed     polyethylene glycol powder (GLYCOLAX/MIRALAX) 17 GM/SCOOP powder Take 17 g by mouth as needed for mild constipation.     No current facility-administered medications for this visit.    PAST MEDICAL HISTORY: Past Medical History:  Diagnosis Date   ABLA (acute blood loss anemia) 06/19/2021   Anxiety    Aortic atherosclerosis (HCC)    Aspiration pneumonia of right lung due to gastric secretions (HCC)    C. difficile colitis 07/13/2021   COVID-19 virus infection 06/08/2021    Depression    DVT of leg (deep venous thrombosis) (HCC) 05/2021   Dysphagia    Failure to thrive in adult    GERD (gastroesophageal reflux disease)    History of kidney stones    right ureter   Hypertension     Hyperthyroidism    Kidney disease    Pulmonary embolism (HCC) 05/2021   Stroke (HCC)    showed up in MRI   Thoracic aortic aneurysm [facility])    Urinary incontinence     PAST SURGICAL HISTORY: Past Surgical History:  Procedure Laterality Date   BIOPSY  06/14/2022   Procedure: BIOPSY;  Surgeon: Dolores Frame, MD;  [facility];  Service: Gastroenterology;;   COLONOSCOPY WITH PROPOFOL N/A 06/14/2022   Procedure: COLONOSCOPY WITH PROPOFOL;  Surgeon: Dolores Frame, MD;  [facility]: [facility];  Service: Gastroenterology;  Laterality: N/A;  11:00am, asa 3   CYSTOSCOPY W/ URETERAL STENT PLACEMENT Right 12/12/2021   Procedure: CYSTOSCOPY WITH RETROGRADE PYELOGRAM/URETERAL STENT PLACEMENT;  Surgeon: Vanna Scotland, MD;  Location: [facility];  Service: Urology;  Laterality: Right;   CYSTOSCOPY/URETEROSCOPY/HOLMIUM LASER/STENT PLACEMENT Right 01/14/2022   Procedure: CYSTOSCOPY/URETEROSCOPY/HOLMIUM LASER/STENT EXCHANGE;  Surgeon: Vanna Scotland, MD;  Location: [facility];  Service: Urology;  Laterality: Right;   ESOPHAGOGASTRODUODENOSCOPY (EGD) WITH PROPOFOL N/A 06/14/2022   Procedure: ESOPHAGOGASTRODUODENOSCOPY (EGD) WITH PROPOFOL;  Surgeon: Dolores Frame, MD;  [facility]: [facility];  Service: Gastroenterology;  Laterality: N/A;   POLYPECTOMY  06/14/2022   Procedure: POLYPECTOMY;  Surgeon: Marguerita Merles, Reuel Boom, MD;  [facility]: [facility];  Service: Gastroenterology;;    FAMILY HISTORY: Family History  Problem Relation Age of Onset   High blood pressure Maternal Grandmother     SOCIAL HISTORY: Social History   Socioeconomic History   Marital status: Married    Spouse name: Fayrene Fearing   Number of children: 5   Years of education: Not on file   Highest education level: Not on file  Occupational History   Not on file  Tobacco Use   Smoking status: Former    Current packs/day: 0.00    Types: Cigarettes    Quit date: 05/27/2018    Years since  quitting: 4.9   Smokeless tobacco: Never  Vaping Use   Vaping status: Never Used  Substance and Sexual Activity   Alcohol use: Not Currently   Drug use: Not Currently   Sexual activity: Not Currently  Other Topics Concern   Not on file  Social History Narrative   Right handed    one story home    lives with family    Doesn't work currently   Social Drivers of Health   Financial Resource Strain: Low Risk  (03/04/2023)   Overall Financial Resource Strain (CARDIA)    Difficulty of Paying Living Expenses: Not hard at all  Food Insecurity: No Food Insecurity (03/04/2023)   Hunger Vital Sign    Worried About Running Out of Food in the Last  Year: Never true    Ran Out of Food in the Last Year: Never true  Transportation Needs: No Transportation Needs (03/04/2023)   PRAPARE - Administrator, Civil Service (Medical): No    Lack of Transportation (Non-Medical): No  Physical Activity: Patient Declined (03/04/2023)   Exercise Vital Sign    Days of Exercise per Week: Patient declined    Minutes of Exercise per Session: Patient declined  Stress: Stress Concern Present (03/04/2023)   [employer] of Occupational Health - Occupational Stress Questionnaire    Feeling of Stress : Very much  Social Connections: Moderately Isolated (03/04/2023)   Social Connection and Isolation Panel [NHANES]    Frequency of Communication with Friends and Family: More than three times a week    Frequency of Social Gatherings with Friends and Family: Patient unable to answer    Attends Religious Services: Never    Database administrator or Organizations: No    Attends Banker Meetings: Never    Marital Status: Married  Catering manager Violence: Not At Risk (03/04/2023)   Humiliation, Afraid, Rape, and Kick questionnaire    Fear of Current or Ex-Partner: No    Emotionally Abused: No    Physically Abused: No    Sexually Abused: No      Levert Feinstein, M.D. Ph.D.  [facility] [address], [address] [place], [place] [address] Ph: [phone] Fax: [phone]  CC:  [name], MD [address] [place],  [place] [address]  [name], MD

## 2023-05-22 ENCOUNTER — Ambulatory Visit: Payer: Self-pay

## 2023-06-26 ENCOUNTER — Other Ambulatory Visit: Payer: Self-pay | Admitting: "Endocrinology

## 2023-07-31 ENCOUNTER — Ambulatory Visit: Payer: Medicare Other | Admitting: "Endocrinology

## 2023-08-01 LAB — TSH: TSH: 0.279 u[IU]/mL — ABNORMAL LOW (ref 0.450–4.500)

## 2023-08-01 LAB — T4, FREE: Free T4: 1.36 ng/dL (ref 0.82–1.77)

## 2023-08-01 LAB — T3, FREE: T3, Free: 2.7 pg/mL (ref 2.0–4.4)

## 2023-08-06 ENCOUNTER — Ambulatory Visit (INDEPENDENT_AMBULATORY_CARE_PROVIDER_SITE_OTHER): Payer: Medicare Other | Admitting: Internal Medicine

## 2023-08-06 ENCOUNTER — Encounter: Payer: Self-pay | Admitting: Internal Medicine

## 2023-08-06 VITALS — BP 138/84 | HR 79 | Ht 66.0 in | Wt 187.6 lb

## 2023-08-06 DIAGNOSIS — R269 Unspecified abnormalities of gait and mobility: Secondary | ICD-10-CM | POA: Diagnosis not present

## 2023-08-06 DIAGNOSIS — N1831 Chronic kidney disease, stage 3a: Secondary | ICD-10-CM | POA: Diagnosis not present

## 2023-08-06 DIAGNOSIS — E0590 Thyrotoxicosis, unspecified without thyrotoxic crisis or storm: Secondary | ICD-10-CM

## 2023-08-06 DIAGNOSIS — F323 Major depressive disorder, single episode, severe with psychotic features: Secondary | ICD-10-CM | POA: Diagnosis not present

## 2023-08-06 DIAGNOSIS — R4189 Other symptoms and signs involving cognitive functions and awareness: Secondary | ICD-10-CM

## 2023-08-06 DIAGNOSIS — K581 Irritable bowel syndrome with constipation: Secondary | ICD-10-CM

## 2023-08-06 DIAGNOSIS — K219 Gastro-esophageal reflux disease without esophagitis: Secondary | ICD-10-CM | POA: Diagnosis not present

## 2023-08-06 NOTE — Assessment & Plan Note (Signed)
On Omeprazole as needed for GERD Reglan as needed for nausea, may help with delayed emptying Needs to eat at regular intervals Avoid hot and spicy food Referred to GI for persistent nausea - had EGD, unremarkable

## 2023-08-06 NOTE — Patient Instructions (Signed)
 Please continue to take medications as prescribed.  Please continue to follow low carb diet and perform moderate exercise/walking as tolerated.

## 2023-08-06 NOTE — Assessment & Plan Note (Signed)
 Needs to maintain adequate fluid intake - has poor PO intake of fluids Continue vitamin D supplement Avoid nephrotoxic agents Check CBC and CMP

## 2023-08-06 NOTE — Assessment & Plan Note (Signed)
 Gait imbalance -slow, unsteady gait No reported falls, has a walker at home Has very limited mobility due to muscle weakness of bilateral LE -would benefit from home PT for strength training, referred to home health

## 2023-08-06 NOTE — Assessment & Plan Note (Addendum)
 Has multinodular goiter Followed by Dr. Fransico Him On methimazole, needs to be compliant Her symptoms have been improving now

## 2023-08-06 NOTE — Assessment & Plan Note (Signed)
 Has memory loss and psychotic behavior at times, unclear if related to her underlying depression in addition to Alzheimer's dementia Has been evaluated by neurology - was advised to continue psychiatry management

## 2023-08-06 NOTE — Progress Notes (Signed)
 Established Patient Office Visit  Subjective:  Patient ID: Amber [name], female    DOB: [birth year]  Age: 78 y.o. MRN: [id number]  CC:  Chief Complaint  Patient presents with   Care Management    6 month f/u     HPI Amber [name] is a 78 y.o. female with past medical history of aortic atherosclerosis, depression with anxiety, PE, hyperthyroidism and protein-calorie malnutrition who presents for f/u of her chronic medical conditions.  Her daughter is present during the visit today.  Nausea/GERD: She has chronic nausea, mildly improved with omeprazole, but stopped it later as it was ineffective. She has had EGD and colonoscopy, which were unremarkable overall except for small sessile polyp and hemorrhoids.  Her appetite has improved now and she has been gaining weight, about 18 lbs since the last visit.  She denies any diarrhea, melena or hematochezia currently.  She has chronic constipation, but does not like taking any laxative.  She feels as if she needs to have a bowel movement, but does not have a satisfactory bowel movement.  Hyperthyroidism: On methimazole, followed by Dr. Fransico Him. She had stopped taking it for a week recently. Her TSH was low in the last week.   Depression with anxiety: Followed by psychiatry - [facility].  She has been placed on Lexapro and Abilify.  She is on Remeron as well.  She is still complains of anxiety/agitation and depressed mood, but has improved now.  Denies any SI or HI currently.  Past Medical History:  Diagnosis Date   ABLA (acute blood loss anemia) 06/19/2021   Anxiety    Aortic atherosclerosis (HCC)    Aspiration pneumonia of right lung due to gastric secretions (HCC)    C. difficile colitis 07/13/2021   COVID-19 virus infection 06/08/2021    Depression    DVT of leg (deep venous thrombosis) (HCC) 05/2021   Dysphagia    Failure to thrive in adult    GERD (gastroesophageal reflux disease)    History of kidney stones    right ureter    Hypertension    Hyperthyroidism    Kidney disease    Pulmonary embolism (HCC) 05/2021   Stroke [facility])    showed up in MRI   Thoracic aortic aneurysm [facility])    Urinary incontinence     Past Surgical History:  Procedure Laterality Date   BIOPSY  06/14/2022   Procedure: BIOPSY;  Surgeon: Dolores Frame, MD;  [facility]: [facility];  Service: Gastroenterology;;   COLONOSCOPY WITH PROPOFOL N/A 06/14/2022   Procedure: COLONOSCOPY WITH PROPOFOL;  Surgeon: Dolores Frame, MD;  Location: [facility];  Service: Gastroenterology;  Laterality: N/A;  11:00am, asa 3   CYSTOSCOPY W/ URETERAL STENT PLACEMENT Right 12/12/2021   Procedure: CYSTOSCOPY WITH RETROGRADE PYELOGRAM/URETERAL STENT PLACEMENT;  Surgeon: Vanna Scotland, MD;  Location: [facility];  Service: Urology;  Laterality: Right;   CYSTOSCOPY/URETEROSCOPY/HOLMIUM LASER/STENT PLACEMENT Right 01/14/2022   Procedure: CYSTOSCOPY/URETEROSCOPY/HOLMIUM LASER/STENT EXCHANGE;  Surgeon: Vanna Scotland, MD;  Location: [facility];  Service: Urology;  Laterality: Right;   ESOPHAGOGASTRODUODENOSCOPY (EGD) WITH PROPOFOL N/A 06/14/2022   Procedure: ESOPHAGOGASTRODUODENOSCOPY (EGD) WITH PROPOFOL;  Surgeon: Dolores Frame, MD;  [facility];  Service: Gastroenterology;  Laterality: N/A;   POLYPECTOMY  06/14/2022   Procedure: POLYPECTOMY;  Surgeon: Dolores Frame, MD;  [facility]: [facility];  Service: Gastroenterology;;    Family History  Problem Relation Age of Onset   High blood pressure Maternal Grandmother  Social History   Socioeconomic History   Marital status: Married    Spouse name: Fayrene Fearing   Number of children: 5   Years of education: Not on file   Highest education level: Some college, no degree  Occupational History   Not on file  Tobacco Use   Smoking status: Former    Current packs/day: 0.00    Types: Cigarettes    Quit date: 05/27/2018    Years since quitting: 5.1   Smokeless  tobacco: Never  Vaping Use   Vaping status: Never Used  Substance and Sexual Activity   Alcohol use: Not Currently   Drug use: Not Currently   Sexual activity: Not Currently  Other Topics Concern   Not on file  Social History Narrative   Right handed    one story home    lives with family    Doesn't work currently   Social Drivers of Health   Financial Resource Strain: Low Risk  (08/04/2023)   Overall Financial Resource Strain (CARDIA)    Difficulty of Paying Living Expenses: Not hard at all  Food Insecurity: No Food Insecurity (08/04/2023)   Hunger Vital Sign    Worried About Running Out of Food in the Last Year: Never true    Ran Out of Food in the Last Year: Never true  Transportation Needs: No Transportation Needs (08/04/2023)   PRAPARE - Administrator, Civil Service (Medical): No    Lack of Transportation (Non-Medical): No  Physical Activity: Unknown (08/04/2023)   Exercise Vital Sign    Days of Exercise per Week: 0 days    Minutes of Exercise per Session: Patient declined  Stress: Stress Concern Present (08/04/2023)   [employer] of Occupational Health - Occupational Stress Questionnaire    Feeling of Stress : Very much  Social Connections: Moderately Isolated (08/04/2023)   Social Connection and Isolation Panel [NHANES]    Frequency of Communication with Friends and Family: More than three times a week    Frequency of Social Gatherings with Friends and Family: Three times a week    Attends Religious Services: Never    Active Member of Clubs or Organizations: No    Attends Banker Meetings: Never    Marital Status: Married  Catering manager Violence: Not At Risk (03/04/2023)   Humiliation, Afraid, Rape, and Kick questionnaire    Fear of Current or Ex-Partner: No    Emotionally Abused: No    Physically Abused: No    Sexually Abused: No    Outpatient Medications Prior to Visit  Medication Sig Dispense Refill   acetaminophen (TYLENOL)  500 MG tablet Take 500 mg by mouth every 6 (six) hours as needed for moderate pain or mild pain.     ARIPiprazole (ABILIFY) 5 MG tablet Take 5 mg by mouth at bedtime. 1 1/2 at bedtime     escitalopram (LEXAPRO) 20 MG tablet Take 20 mg by mouth daily.     ferrous sulfate 325 (65 FE) MG tablet Take 1 tablet (325 mg total) by mouth daily with breakfast. 30 tablet 0   methimazole (TAPAZOLE) 5 MG tablet TAKE 1/2 TABLET BY MOUTH ONCE DAILY. 45 tablet 1   mirtazapine (REMERON) 15 MG tablet Take 1 tablet (15 mg total) by mouth at bedtime. 90 tablet 0   Multiple Vitamin (MULTIVITAMIN WITH MINERALS) TABS tablet Take 1 tablet by mouth daily. 30 tablet 0   OVER THE COUNTER MEDICATION Stool softner as needed     polyethylene glycol  powder (GLYCOLAX/MIRALAX) 17 GM/SCOOP powder Take 17 g by mouth as needed for mild constipation.     No facility-administered medications prior to visit.    Allergies  Allergen Reactions   Fish Allergy     Does not eat fish   Shellfish Allergy     "I pass out"   Valium [Diazepam] Other (See Comments)    Makes her want to " fly"    ROS Review of Systems  Constitutional:  Positive for fatigue. Negative for chills and fever.  HENT:  Negative for congestion, sinus pressure, sinus pain and sore throat.   Eyes:  Negative for pain and discharge.  Respiratory:  Negative for cough and shortness of breath.   Cardiovascular:  Negative for chest pain and palpitations.  Gastrointestinal:  Positive for constipation and nausea. Negative for abdominal pain, diarrhea and vomiting.  Endocrine: Negative for polydipsia and polyuria.  Genitourinary:  Negative for dysuria and hematuria.  Musculoskeletal:  Positive for gait problem and myalgias. Negative for neck pain and neck stiffness.  Skin:  Negative for rash.  Neurological:  Positive for tremors, weakness and numbness. Negative for dizziness.  Psychiatric/Behavioral:  Positive for agitation, behavioral problems, decreased  concentration, dysphoric mood and sleep disturbance. Negative for suicidal ideas. The patient is nervous/anxious.       Objective:    Physical Exam Vitals reviewed.  Constitutional:      General: She is not in acute distress.    Appearance: She is not diaphoretic.     Comments: Has rolling walker  HENT:     Head: Normocephalic and atraumatic.     Nose: Nose normal.     Mouth/Throat:     Mouth: Mucous membranes are moist.  Eyes:     General: No scleral icterus.    Extraocular Movements: Extraocular movements intact.  Cardiovascular:     Rate and Rhythm: Normal rate and regular rhythm.     Pulses: Normal pulses.     Heart sounds: Normal heart sounds. No murmur heard. Pulmonary:     Breath sounds: Normal breath sounds. No wheezing or rales.  Abdominal:     Palpations: Abdomen is soft.     Tenderness: There is no abdominal tenderness.  Musculoskeletal:     Cervical back: Neck supple. No tenderness.     Right lower leg: No edema.     Left lower leg: No edema.  Skin:    General: Skin is warm.     Findings: No rash.  Neurological:     General: No focal deficit present.     Mental Status: She is alert and oriented to person, place, and time.     Motor: Weakness (3/5 in b/l UE and LE) present.     Gait: Gait abnormal (Slow, unsteady gait).  Psychiatric:        Mood and Affect: Mood is anxious.        Behavior: Behavior is cooperative.     BP 138/84 (BP Location: Left Arm, Patient Position: Sitting)   Pulse 79   Ht 5\' 6"  (1.676 m)   Wt 187 lb 9.6 oz (85.1 kg)   SpO2 96%   BMI 30.28 kg/m  Wt Readings from Last 3 Encounters:  08/06/23 187 lb 9.6 oz (85.1 kg)  03/12/23 173 lb (78.5 kg)  03/04/23 170 lb (77.1 kg)    Lab Results  Component Value Date   TSH 0.279 (L) 07/31/2023   Lab Results  Component Value Date   WBC 6.3 02/13/2023  HGB 12.4 02/13/2023   HCT 38.8 02/13/2023   MCV 90 02/13/2023   PLT 198 02/13/2023   Lab Results  Component Value Date   NA  143 02/13/2023   K 4.0 02/13/2023   CO2 21 02/13/2023   GLUCOSE 87 02/13/2023   BUN 18 02/13/2023   CREATININE 1.11 (H) 02/13/2023   BILITOT 0.3 02/13/2023   ALKPHOS 97 02/13/2023   AST 12 02/13/2023   ALT 5 02/13/2023   PROT 6.6 02/13/2023   ALBUMIN 4.0 02/13/2023   CALCIUM 10.8 (H) 02/13/2023   ANIONGAP 6 12/17/2021   EGFR 52 (L) 02/13/2023   Lab Results  Component Value Date   CHOL 162 10/19/2021   Lab Results  Component Value Date   HDL 46 10/19/2021   Lab Results  Component Value Date   LDLCALC 97 10/19/2021   Lab Results  Component Value Date   TRIG 102 10/19/2021   Lab Results  Component Value Date   CHOLHDL 3.5 10/19/2021   Lab Results  Component Value Date   HGBA1C 4.9 10/19/2021      Assessment & Plan:   Problem List Items Addressed This Visit       Digestive   Gastroesophageal reflux disease without esophagitis   On Omeprazole as needed for GERD Reglan as needed for nausea, may help with delayed emptying Needs to eat at regular intervals Avoid hot and spicy food Referred to GI for persistent nausea - had EGD, unremarkable      IBS (irritable bowel syndrome)   Has chronic constipation Needs to take MiraLAX as needed for constipation Advised to take Benefiber or Metamucil Needs to improve fluid intake        Endocrine   Hyperthyroidism - Primary   Has multinodular goiter Followed by Dr. Fransico Him On methimazole, needs to be compliant Her symptoms have been improving now        Genitourinary   Stage 3a chronic kidney disease (HCC)   Needs to maintain adequate fluid intake - has poor PO intake of fluids Continue vitamin D supplement Avoid nephrotoxic agents Check CBC and CMP      Relevant Orders   Ambulatory referral to Home Health   CBC with Differential/Platelet   CMP14+EGFR     Other   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HCC) (Chronic)   Currently better controlled overall Has had ECT during hospitalization (2023) Continue Abilify,  Remeron and Lexapro Followed by psychiatry - [facility]      Gait disturbance   Gait imbalance -slow, unsteady gait No reported falls, has a walker at home Has very limited mobility due to muscle weakness of bilateral LE -would benefit from home PT for strength training, referred to home health      Relevant Orders   Ambulatory referral to Home Health   Cognitive impairment   Has memory loss and psychotic behavior at times, unclear if related to her underlying depression in addition to Alzheimer's dementia Has been evaluated by neurology - was advised to continue psychiatry management      Relevant Orders   Ambulatory referral to Home Health     No orders of the defined types were placed in this encounter.   Follow-up: Return in about 6 months (around 02/06/2024).    [name], MD

## 2023-08-06 NOTE — Assessment & Plan Note (Signed)
Has chronic constipation Needs to take MiraLAX as needed for constipation Advised to take Benefiber or Metamucil Needs to improve fluid intake

## 2023-08-06 NOTE — Assessment & Plan Note (Addendum)
 Currently better controlled overall Has had ECT during hospitalization (2023) Continue Abilify, Remeron and Lexapro Followed by psychiatry - [facility]

## 2023-08-07 ENCOUNTER — Ambulatory Visit (INDEPENDENT_AMBULATORY_CARE_PROVIDER_SITE_OTHER): Payer: Medicare Other | Admitting: "Endocrinology

## 2023-08-07 ENCOUNTER — Encounter: Payer: Self-pay | Admitting: "Endocrinology

## 2023-08-07 VITALS — BP 128/84 | HR 64 | Ht 66.0 in | Wt 188.6 lb

## 2023-08-07 DIAGNOSIS — E0590 Thyrotoxicosis, unspecified without thyrotoxic crisis or storm: Secondary | ICD-10-CM

## 2023-08-07 MED ORDER — METHIMAZOLE 5 MG PO TABS: 2.5000 mg | ORAL_TABLET | Freq: Every day | ORAL | 1 refills | Status: DC

## 2023-08-07 MED ORDER — METHIMAZOLE 5 MG PO TABS: 5.0000 mg | ORAL_TABLET | Freq: Every day | ORAL | 1 refills | Status: DC

## 2023-08-07 NOTE — Progress Notes (Signed)
 08/07/2023    Endocrinology follow-up note   Subjective:    Patient ID: Amber [name], female    DOB: May 11, 1946, PCP [name], MD.   Past Medical History:  Diagnosis Date   ABLA (acute blood loss anemia) 06/19/2021   Anxiety    Aortic atherosclerosis (HCC)    Aspiration pneumonia of right lung due to gastric secretions (HCC)    C. difficile colitis 07/13/2021   COVID-19 virus infection 06/08/2021    Depression    DVT of leg (deep venous thrombosis) (HCC) 05/2021   Dysphagia    Failure to thrive in adult    GERD (gastroesophageal reflux disease)    History of kidney stones    right ureter   Hypertension    Hyperthyroidism    Kidney disease    Pulmonary embolism (HCC) 05/2021   Stroke [facility])    showed up in MRI   Thoracic aortic aneurysm [facility])    Urinary incontinence     Past Surgical History:  Procedure Laterality Date   BIOPSY  06/14/2022   Procedure: BIOPSY;  Surgeon: Dolores Frame, MD;  [facility];  Service: Gastroenterology;;   COLONOSCOPY WITH PROPOFOL N/A 06/14/2022   Procedure: COLONOSCOPY WITH PROPOFOL;  Surgeon: Dolores Frame, MD;  Location: [facility];  Service: Gastroenterology;  Laterality: N/A;  11:00am, asa 3   CYSTOSCOPY W/ URETERAL STENT PLACEMENT Right 12/12/2021   Procedure: CYSTOSCOPY WITH RETROGRADE PYELOGRAM/URETERAL STENT PLACEMENT;  Surgeon: Vanna Scotland, MD;  Location: [facility];  Service: Urology;  Laterality: Right;   CYSTOSCOPY/URETEROSCOPY/HOLMIUM LASER/STENT PLACEMENT Right 01/14/2022   Procedure: CYSTOSCOPY/URETEROSCOPY/HOLMIUM LASER/STENT EXCHANGE;  Surgeon: Vanna Scotland, MD;  Location: [facility];  Service: Urology;  Laterality: Right;   ESOPHAGOGASTRODUODENOSCOPY (EGD) WITH PROPOFOL N/A 06/14/2022   Procedure: ESOPHAGOGASTRODUODENOSCOPY (EGD) WITH PROPOFOL;  Surgeon: Dolores Frame, MD;  [facility];  Service: Gastroenterology;  Laterality: N/A;   POLYPECTOMY   06/14/2022   Procedure: POLYPECTOMY;  Surgeon: Marguerita Merles, Reuel Boom, MD;  [facility];  Service: Gastroenterology;;    Social History   Socioeconomic History   Marital status: Married    Spouse name: Fayrene Fearing   Number of children: 5   Years of education: Not on file   Highest education level: Some college, no degree  Occupational History   Not on file  Tobacco Use   Smoking status: Former    Current packs/day: 0.00    Types: Cigarettes    Quit date: 05/27/2018    Years since quitting: 5.2   Smokeless tobacco: Never  Vaping Use   Vaping status: Never Used  Substance and Sexual Activity   Alcohol use: Not Currently   Drug use: Not Currently   Sexual activity: Not Currently  Other Topics Concern   Not on file  Social History Narrative   Right handed    one story home    lives with family    Doesn't work currently   Social Drivers of Health   Financial Resource Strain: Low Risk  (08/04/2023)   Overall Financial Resource Strain (CARDIA)    Difficulty of Paying Living Expenses: Not hard at all  Food Insecurity: No Food Insecurity (08/04/2023)   Hunger Vital Sign    Worried About Running Out of Food in the Last Year: Never true    Ran Out of Food in the Last Year: Never true  Transportation Needs: No Transportation Needs (08/04/2023)   PRAPARE - Transportation    Lack of  Transportation (Medical): No    Lack of Transportation (Non-Medical): No  Physical Activity: Unknown (08/04/2023)   Exercise Vital Sign    Days of Exercise per Week: 0 days    Minutes of Exercise per Session: Patient declined  Stress: Stress Concern Present (08/04/2023)   [employer] of Occupational Health - Occupational Stress Questionnaire    Feeling of Stress : Very much  Social Connections: Moderately Isolated (08/04/2023)   Social Connection and Isolation Panel [NHANES]    Frequency of Communication with Friends and Family: More than three times a week    Frequency of Social  Gatherings with Friends and Family: Three times a week    Attends Religious Services: Never    Active Member of Clubs or Organizations: No    Attends Banker Meetings: Never    Marital Status: Married    Family History  Problem Relation Age of Onset   High blood pressure Maternal Grandmother     Outpatient Encounter Medications as of 08/07/2023  Medication Sig   acetaminophen (TYLENOL) 500 MG tablet Take 500 mg by mouth every 6 (six) hours as needed for moderate pain or mild pain.   ARIPiprazole (ABILIFY) 5 MG tablet Take 5 mg by mouth at bedtime. 1 1/2 at bedtime   escitalopram (LEXAPRO) 20 MG tablet Take 20 mg by mouth daily.   ferrous sulfate 325 (65 FE) MG tablet Take 1 tablet (325 mg total) by mouth daily with breakfast.   methimazole (TAPAZOLE) 5 MG tablet Take 0.5 tablets (2.5 mg total) by mouth daily.   mirtazapine (REMERON) 15 MG tablet Take 1 tablet (15 mg total) by mouth at bedtime.   Multiple Vitamin (MULTIVITAMIN WITH MINERALS) TABS tablet Take 1 tablet by mouth daily.   OVER THE COUNTER MEDICATION Stool softner as needed   polyethylene glycol powder (GLYCOLAX/MIRALAX) 17 GM/SCOOP powder Take 17 g by mouth as needed for mild constipation.   [DISCONTINUED] methimazole (TAPAZOLE) 5 MG tablet TAKE 1/2 TABLET BY MOUTH ONCE DAILY.   [DISCONTINUED] methimazole (TAPAZOLE) 5 MG tablet Take 1 tablet (5 mg total) by mouth daily.   No facility-administered encounter medications on file as of 08/07/2023.    ALLERGIES: Allergies  Allergen Reactions   Fish Allergy     Does not eat fish   Shellfish Allergy     "I pass out"   Valium [Diazepam] Other (See Comments)    Makes her want to " fly"    VACCINATION STATUS:  There is no immunization history on file for this patient.   HPI  Amber [name] is 78 y.o. female who presents today with a medical history as above. she is being seen in follow-up after she was seen in consultation for hyperthyroidism requested by  [name], MD.    After thyroid uptake and scan showed cold nodule in the left lobe of her thyroid, she was considered for dedicated thyroid ultrasound which confirms 2.8 cm suspicious nodule corresponding to the area of cold nodule on the uptake and scan. She underwent  FNA biopsy of this nodule with inconclusive cytology which needed molecular study. Her afirma results are benign. she has been dealing with symptoms of anxiety, weight loss, sleep disturbance for several weeks. She was kept on methimazole 2.5 mg p.o. daily at breakfast during her last visit.  It appears that she had interrupted her treatment for 2 weeks prior to her last blood draw showing slight evidence of relapse.     she denies dysphagia, choking, shortness  of breath, no recent voice change.   She c/o nausea , scheduled to see GI. she denies family history of thyroid dysfunction nor thyroid malignancy. she denies personal history of goiter. she is not on any anti-thyroid medications nor on any thyroid hormone supplements. She is accompanied by her daughter who is offering help to her mother During her prior labs she was found to have mild hypercalcemia, which has since resolved.  She was found to have mild elevation of PTH on the background of CKD.  She denies any prior history of parathyroid dysfunction.                              Review of systems  Constitutional: + Steady weight gain, reports dietary indiscretion,  + fatigue, + subjective hyperthermia Eyes: no blurry vision, +xerophthalmia ENT: no sore throat, no nodules palpated in throat, no dysphagia/odynophagia, nor hoarseness    Objective:    BP 128/84   Pulse 64   Ht 5\' 6"  (1.676 m)   Wt 188 lb 9.6 oz (85.5 kg)   BMI 30.44 kg/m   Wt Readings from Last 3 Encounters:  08/07/23 188 lb 9.6 oz (85.5 kg)  08/06/23 187 lb 9.6 oz (85.1 kg)  03/12/23 173 lb (78.5 kg)                                                Physical exam  Constitutional: Body  mass index is 30.44 kg/m., not in acute distress, + anxious state of mind Eyes: PERRLA, EOMI,  exophthalmos    CMP     Component Value Date/Time   NA 143 02/13/2023 1615   K 4.0 02/13/2023 1615   CL 107 (H) 02/13/2023 1615   CO2 21 02/13/2023 1615   GLUCOSE 87 02/13/2023 1615   GLUCOSE 107 (H) 04/29/2022 1143   BUN 18 02/13/2023 1615   CREATININE 1.11 (H) 02/13/2023 1615   CREATININE 1.06 (H) 04/29/2022 1143   CALCIUM 10.8 (H) 02/13/2023 1615   PROT 6.6 02/13/2023 1615   ALBUMIN 4.0 02/13/2023 1615   AST 12 02/13/2023 1615   AST 9 (L) 10/23/2021 1411   ALT 5 02/13/2023 1615   ALT 6 10/23/2021 1411   ALKPHOS 97 02/13/2023 1615   BILITOT 0.3 02/13/2023 1615   BILITOT 0.4 10/23/2021 1411   GFRNONAA >60 12/17/2021 0555   GFRNONAA 46 (L) 10/23/2021 1411     CBC    Component Value Date/Time   WBC 6.3 02/13/2023 1615   WBC 5.7 04/29/2022 1143   RBC 4.29 02/13/2023 1615   RBC 4.22 04/29/2022 1143   HGB 12.4 02/13/2023 1615   HCT 38.8 02/13/2023 1615   PLT 198 02/13/2023 1615   MCV 90 02/13/2023 1615   MCH 28.9 02/13/2023 1615   MCH 28.7 04/29/2022 1143   MCHC 32.0 02/13/2023 1615   MCHC 32.5 04/29/2022 1143   RDW 13.3 02/13/2023 1615   LYMPHSABS 1.8 02/13/2023 1615   MONOABS 0.5 12/12/2021 1515   EOSABS 0.1 02/13/2023 1615   BASOSABS 0.0 02/13/2023 1615     Lab Results  Component Value Date   TSH 0.279 (L) 07/31/2023   TSH 1.550 12/31/2022   TSH 0.862 10/17/2022   TSH 1.870 06/07/2022   TSH 1.670 02/06/2022   TSH 0.060 (L) 12/12/2021   TSH  0.022 (L) 08/30/2021   TSH <0.080 (L) 08/10/2021   TSH 0.545 06/09/2021   TSH 0.559 06/08/2021   FREET4 1.36 07/31/2023   FREET4 1.08 12/31/2022   FREET4 1.26 10/17/2022   FREET4 0.98 06/07/2022   FREET4 0.99 02/06/2022   FREET4 1.16 (H) 12/12/2021   FREET4 1.43 08/30/2021     Thyroid uptake and scan on 09/11/21 4 hour I-123 uptake = 2.9% (normal 5-20%),  24 hour I-123 uptake = 11.9% (normal 10-30%)    IMPRESSION: 1. Decreased iodine uptake at 4 hours, with low normal uptake at 24 hours. 2. Diffuse heterogeneity of the thyroid parenchyma, with equivocal findings for cold nodule corresponding to the mid left lobe thyroid lesion seen on prior CT. Correlation with ultrasound is recommended to help guide possible sampling.   FNA- AUS Afirma - Benign  Assessment & Plan:   1. Hyperthyroidism 2.  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 3.  Hypercalcemia: She was recently diagnosed with mild hypercalcemia  I reviewed her labs and thyroid scan with her and her daughter.  She is responding to low-dose methimazole.  Her recent lab work is indicative of treatment withdrawal for more than 15 days corroborated by her daughter.  She is advised to resume her methimazole 2.5 mg currently daily with breakfast with plan to repeat labs in 47-month. She has had benign results of fine-needle aspiration and molecular study to 2.8 cm nodule in the left lobe of her thyroid.   She is already on Metoprolol, no need for additional Beta blocker. Her hypercalcemia has now resolved.  She continues to have mild elevation of PTH on the background of mild CKD.  She will have repeat calcium, PTH, magnesium, serum phosphorus before her next visit.   She will also need bone density during her annual physical exam.  -Patient is advised to maintain close follow up with [name], MD for primary care needs.   I spent  22  minutes in the care of the patient today including review of labs from Thyroid Function, CMP, and other relevant labs ; imaging/biopsy records (current and previous including abstractions from other facilities); face-to-face time discussing  her lab results and symptoms, medications doses, her options of short and long term treatment based on the latest standards of care / guidelines;   and documenting the encounter.  Amber [name]  participated in  the discussions, expressed understanding, and voiced agreement with the above plans.  All questions were answered to her satisfaction. she is encouraged to contact clinic should she have any questions or concerns prior to her return visit.   Follow up plan: Return in about 6 months (around 02/07/2024) for F/U with Pre-visit Labs.   Thank you for involving me in the care of this pleasant patient, and I will continue to update you with her progress.  Marquis Lunch, MD [facility] Phone: [phone]  Fax: [phone]   08/07/2023, 2:03 PM  This note was partially dictated with voice recognition software. Similar sounding words can be transcribed inadequately or may not  be corrected upon review.

## 2023-08-18 IMAGING — DX DG CHEST 1V PORT
1 series · 1 of 1 positions shown · non-contrast
Comparison: Portable chest 06/20/2021.

CLINICAL DATA: Shortness of breath.

EXAM:
PORTABLE CHEST 1 VIEW

[chest ap]
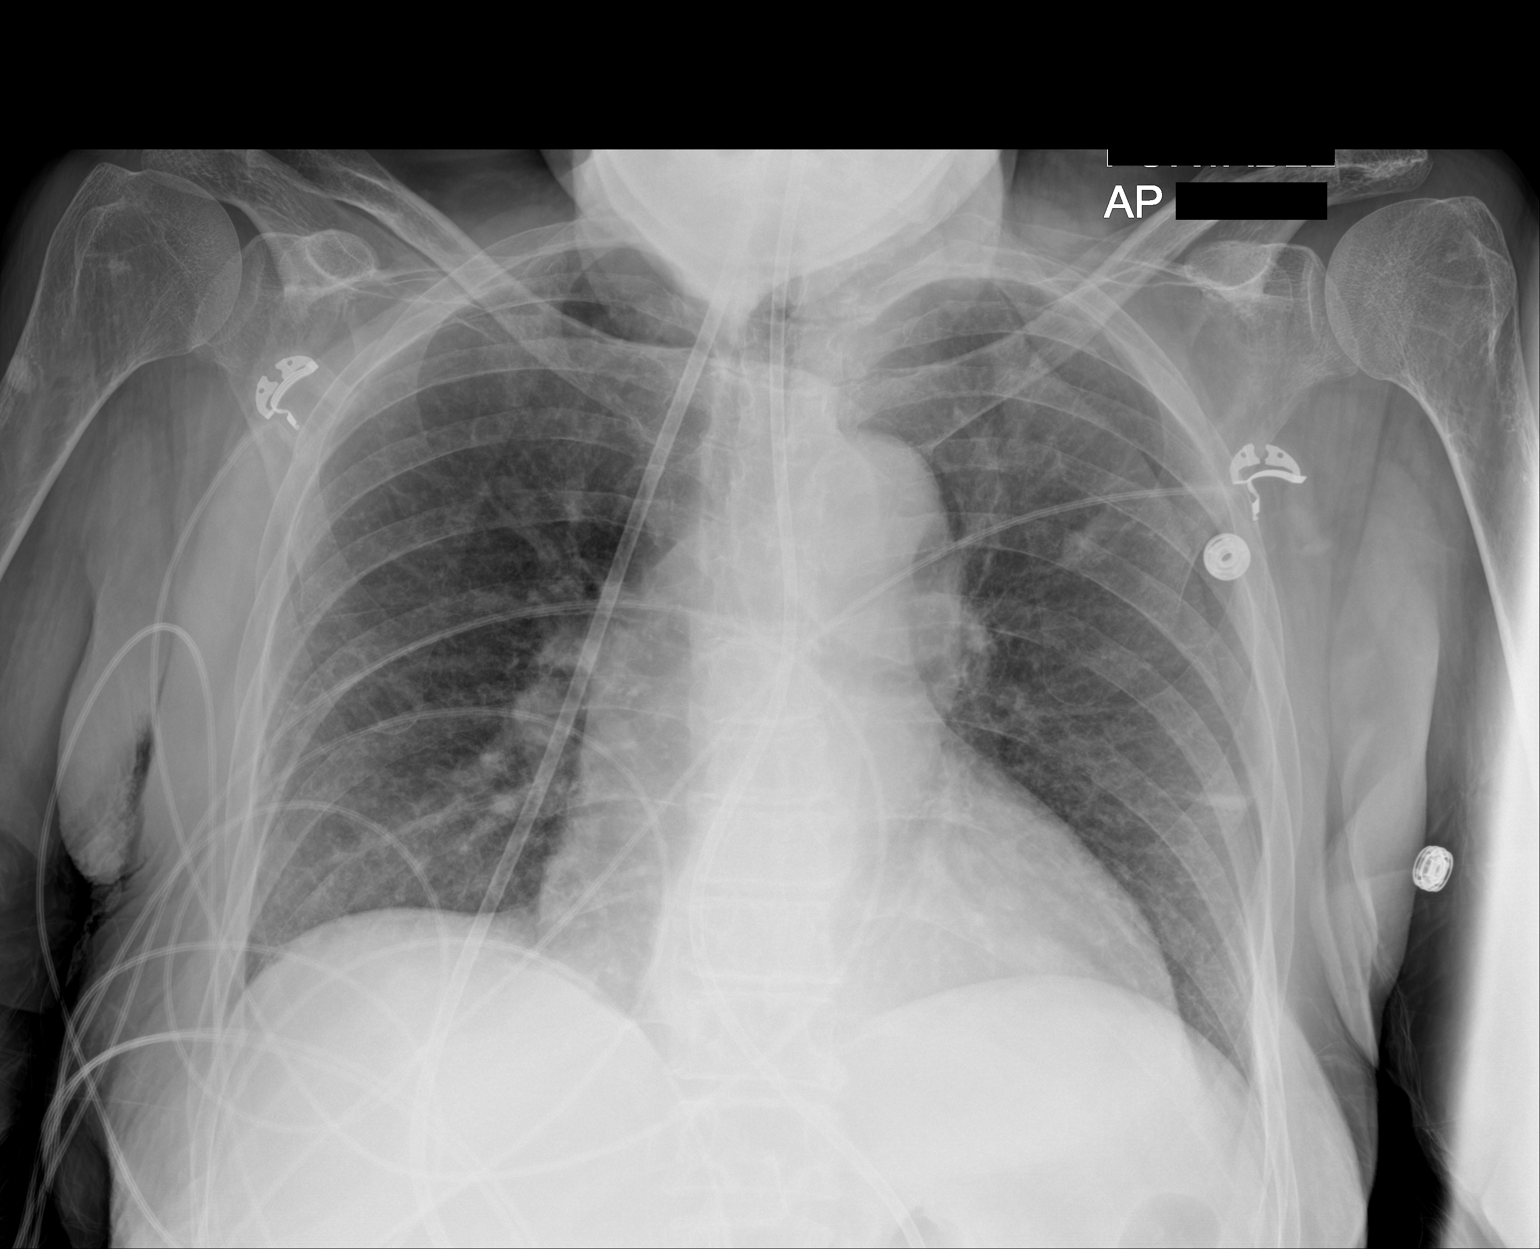

[1 of 1 positions shown; findings below may reference images not displayed]

FINDINGS: [DATE] a.m., 06/23/2021. Cardiomegaly. The central vessels are normal
in caliber. Stable mediastinum with aortic tortuosity and ectasia.
The lungs are clear of focal infiltrates, with linear scarring or
atelectasis left mid field again noted.

There is an irregular left upper lobe opacity measuring 1.7 x
cm. In retrospect this may have been present on earlier studies.
Findings concerning for pulmonary nodule. Chest CT recommended
preferably with contrast.

No pleural effusion is seen. Osteopenia thoracic spondylosis.
Feeding tube enters the stomach.
IMPRESSION: 1.7 x 0.8 cm left upper lobe opacity concerning for nodule. Chest CT
recommended, preferably with contrast. No appreciable focal
pneumonia. Cardiomegaly.

## 2023-08-19 IMAGING — CT CT CHEST W/ CM
2 of 4 series · 15 of 36 positions shown, 18 images · IV contrast (agent unspecified)
Comparison: Same day radiograph.

CLINICAL DATA: Lung nodule, > 8mm Lung Nodule

EXAM:
CT CHEST WITH CONTRAST
TECHNIQUE: Multidetector CT imaging of the chest was performed during
intravenous contrast administration.

[Series 2: axial st · axial · 0.73mm/px · z∈[-1080,-834]mm · 12 of 147 slices shown, 15 images]
[im 12/147  mediastinal]
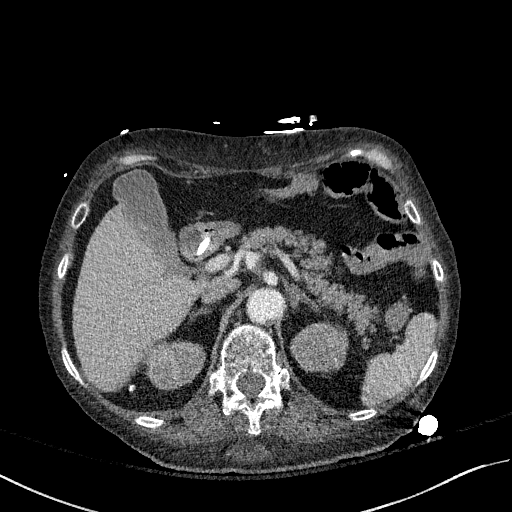
[im 12/147  lung]
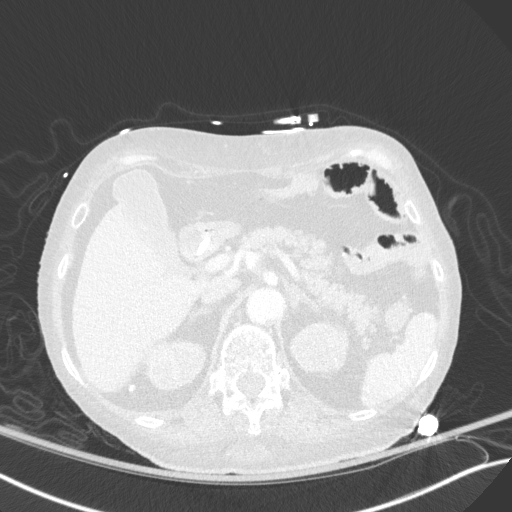
[im 23/147  lung]
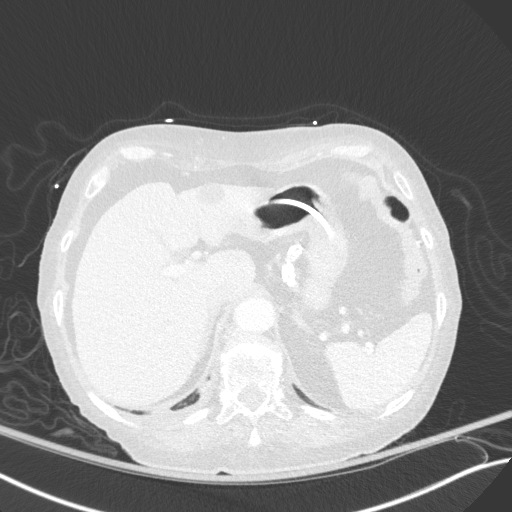
[im 34/147  lung]
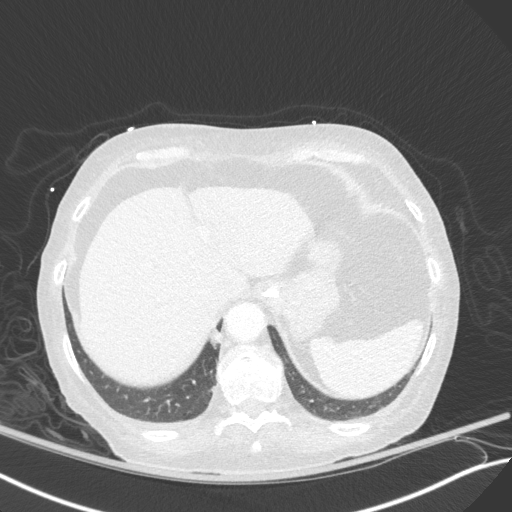
[im 45/147  lung]
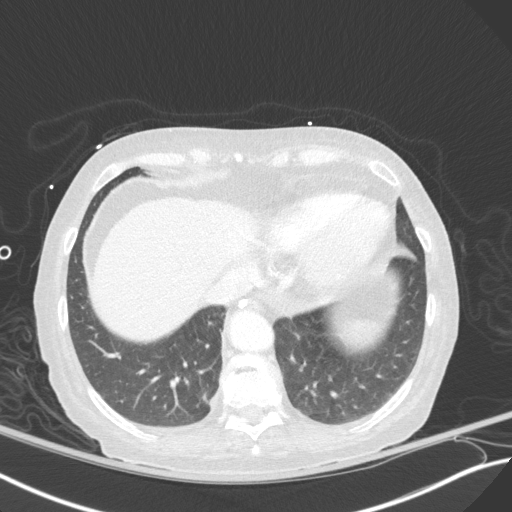
[im 57/147  mediastinal]
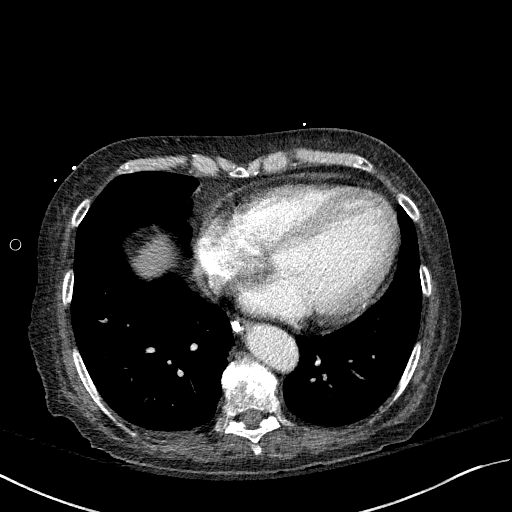
[im 57/147  lung]
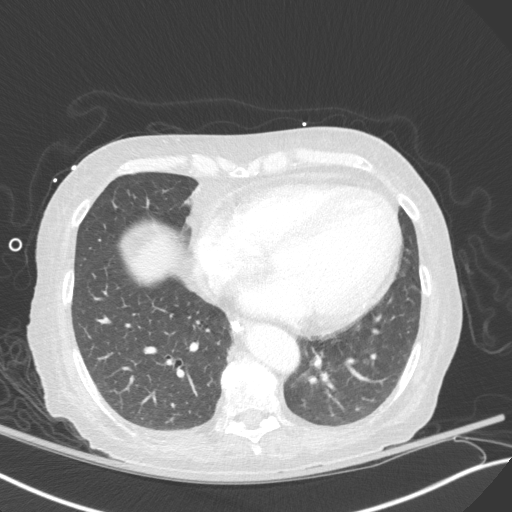
[im 68/147  lung]
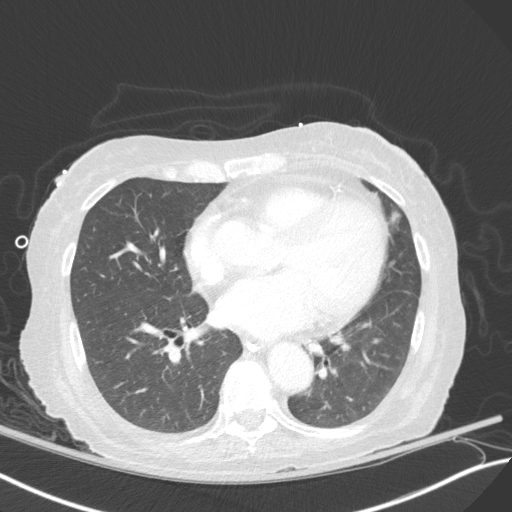
[im 79/147  lung]
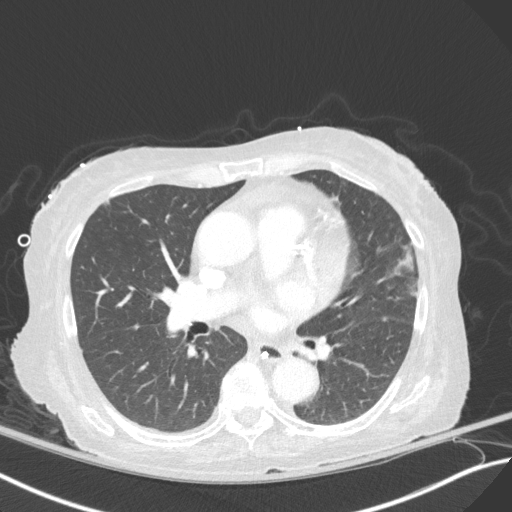
[im 90/147  lung]
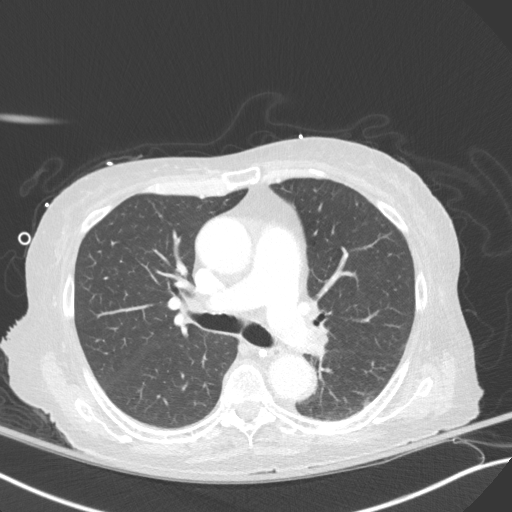
[im 102/147  mediastinal]
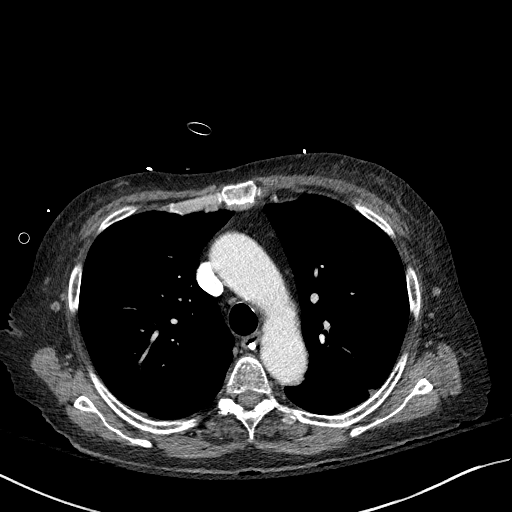
[im 102/147  lung]
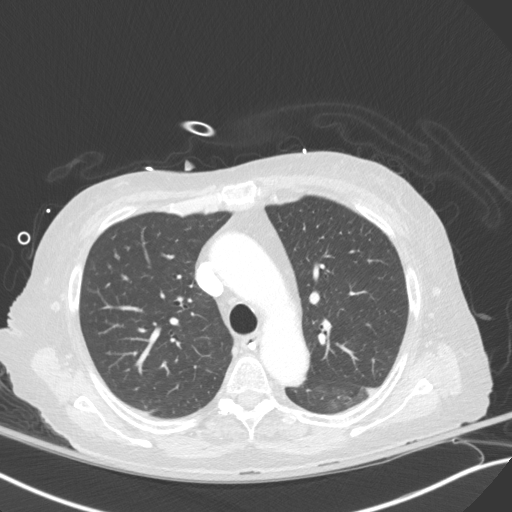
[im 113/147  lung]
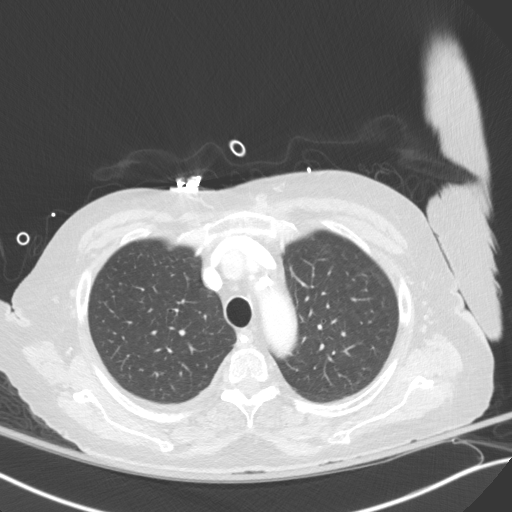
[im 124/147  lung]
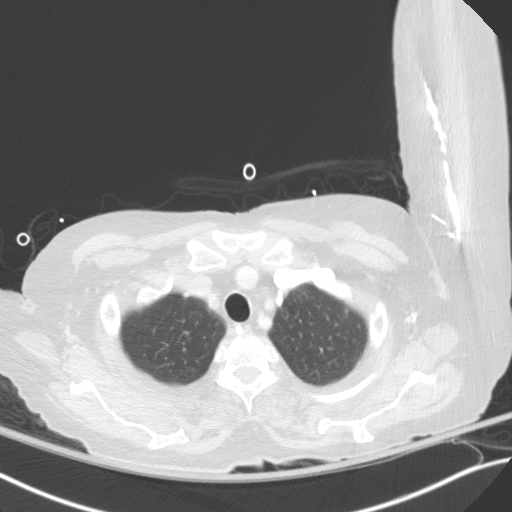
[im 135/147  lung]
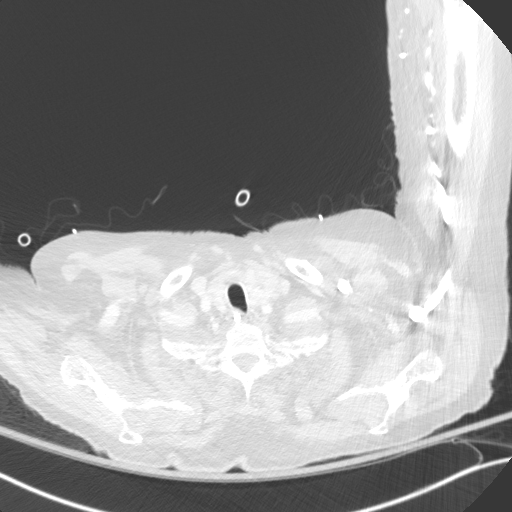

[Series 5: coronal · coronal · 0.59mm/px · 3 of 191 slices shown]
[im 39/191  lung]
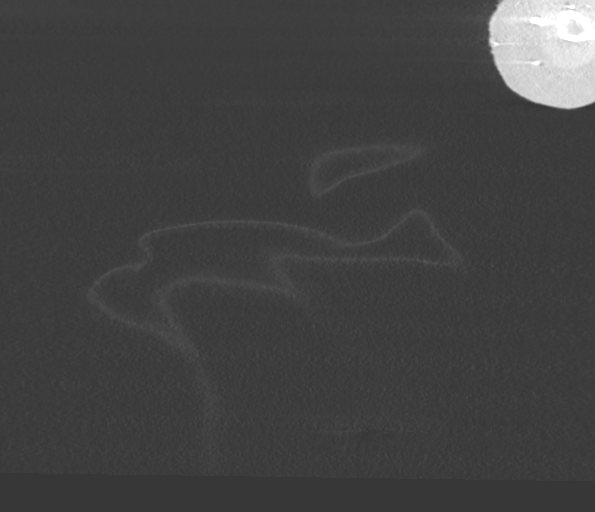
[im 77/191  lung]
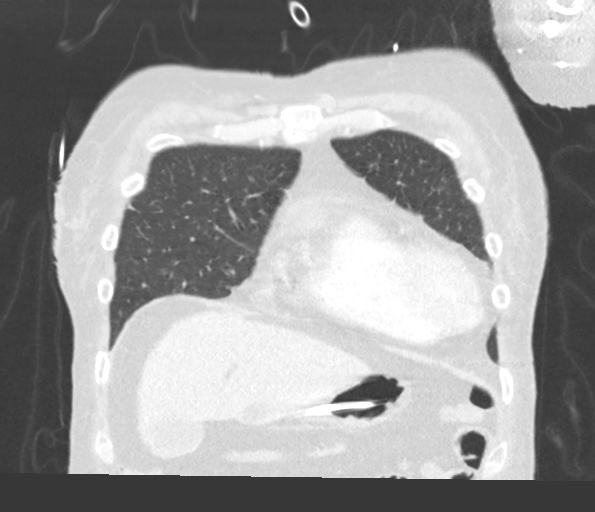
[im 115/191  lung]
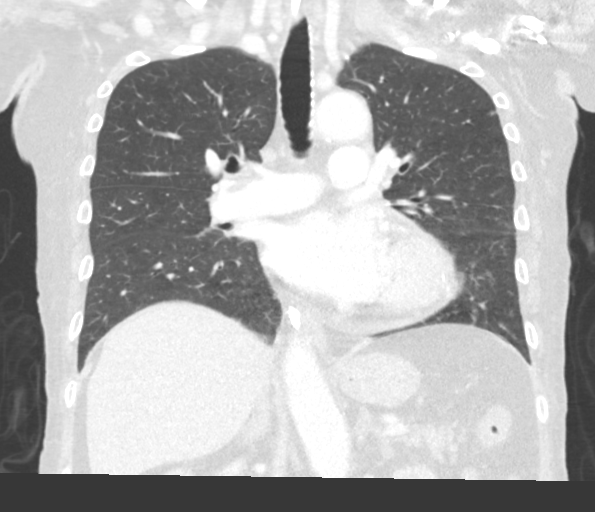

[15 of 36 positions shown; findings below may reference images not displayed]

RADIATION DOSE REDUCTION: This exam was performed according to the
departmental dose-optimization program which includes automated
exposure control, adjustment of the mA and/or kV according to
patient size and/or use of iterative reconstruction technique.

CONTRAST:  75mL OMNIPAQUE IOHEXOL 300 MG/ML  SOLN
FINDINGS: Cardiovascular: There is peripherally located pulmonary embolism
located in both main pulmonary arteries. This is nonocclusive. No CT
evidence of RIGHT heart strain. Heart is enlarged. Three-vessel
coronary artery atherosclerotic calcifications. No pericardial
effusion. LEFT vertebral artery arises directly from the aorta.

Mediastinum/Nodes: LEFT thyroid nodule measuring approximately 20
mm. No suspicious axillary or mediastinal adenopathy.

Lungs/Pleura: No pleural effusion or pneumothorax. Nodular opacity
of the superior aspect of the LEFT lower lobe demonstrates several
internal lucencies and spans 15 by 10 mm (series 2, image 50).
Additional scattered ground-glass opacities throughout the LEFT
lower lobe. Lingular atelectasis.

Upper Abdomen: LEFT hepatic cyst. Enteric tube tip terminates in the
duodenum. Diverticulosis.

Musculoskeletal: Degenerative changes of the thoracic spine.
IMPRESSION: 1. There is peripherally located pulmonary embolism located most
centrally within the main branches of the pulmonary artery. It is
nonocclusive. No CT evidence of RIGHT heart strain. This is
consistent with age-indeterminate pulmonary emboli.
2. Nodular opacity seen on chest radiograph corresponds to a
ground-glass opacity with internal lucency in the superior aspect of
the LEFT lower lobe. Differential considerations include infection,
sequela of pulmonary infarction, or neoplasm. Recommend follow-up CT
in 3 months. This recommendation follows the consensus statement:
Guidelines for Management of Incidental Pulmonary Nodules Detected
[DATE].
3. There is a 2 cm LEFT thyroid nodule. Recommend nonemergent
thyroid US (ref: [HOSPITAL]. [DATE]): 143-50).

Aortic Atherosclerosis ([id number]-[id number].W).

These results were called by telephone at the time of interpretation
on 06/24/2021 at [DATE] to provider QUIRIJN AMAZIGH , who verbally
acknowledged these results.

## 2023-08-19 IMAGING — DX DG CHEST 1V PORT
1 series · 1 of 1 positions shown · non-contrast
Comparison: June 23, 2021

CLINICAL DATA: Pre ECT

EXAM:
PORTABLE CHEST 1 VIEW

[chest ap]
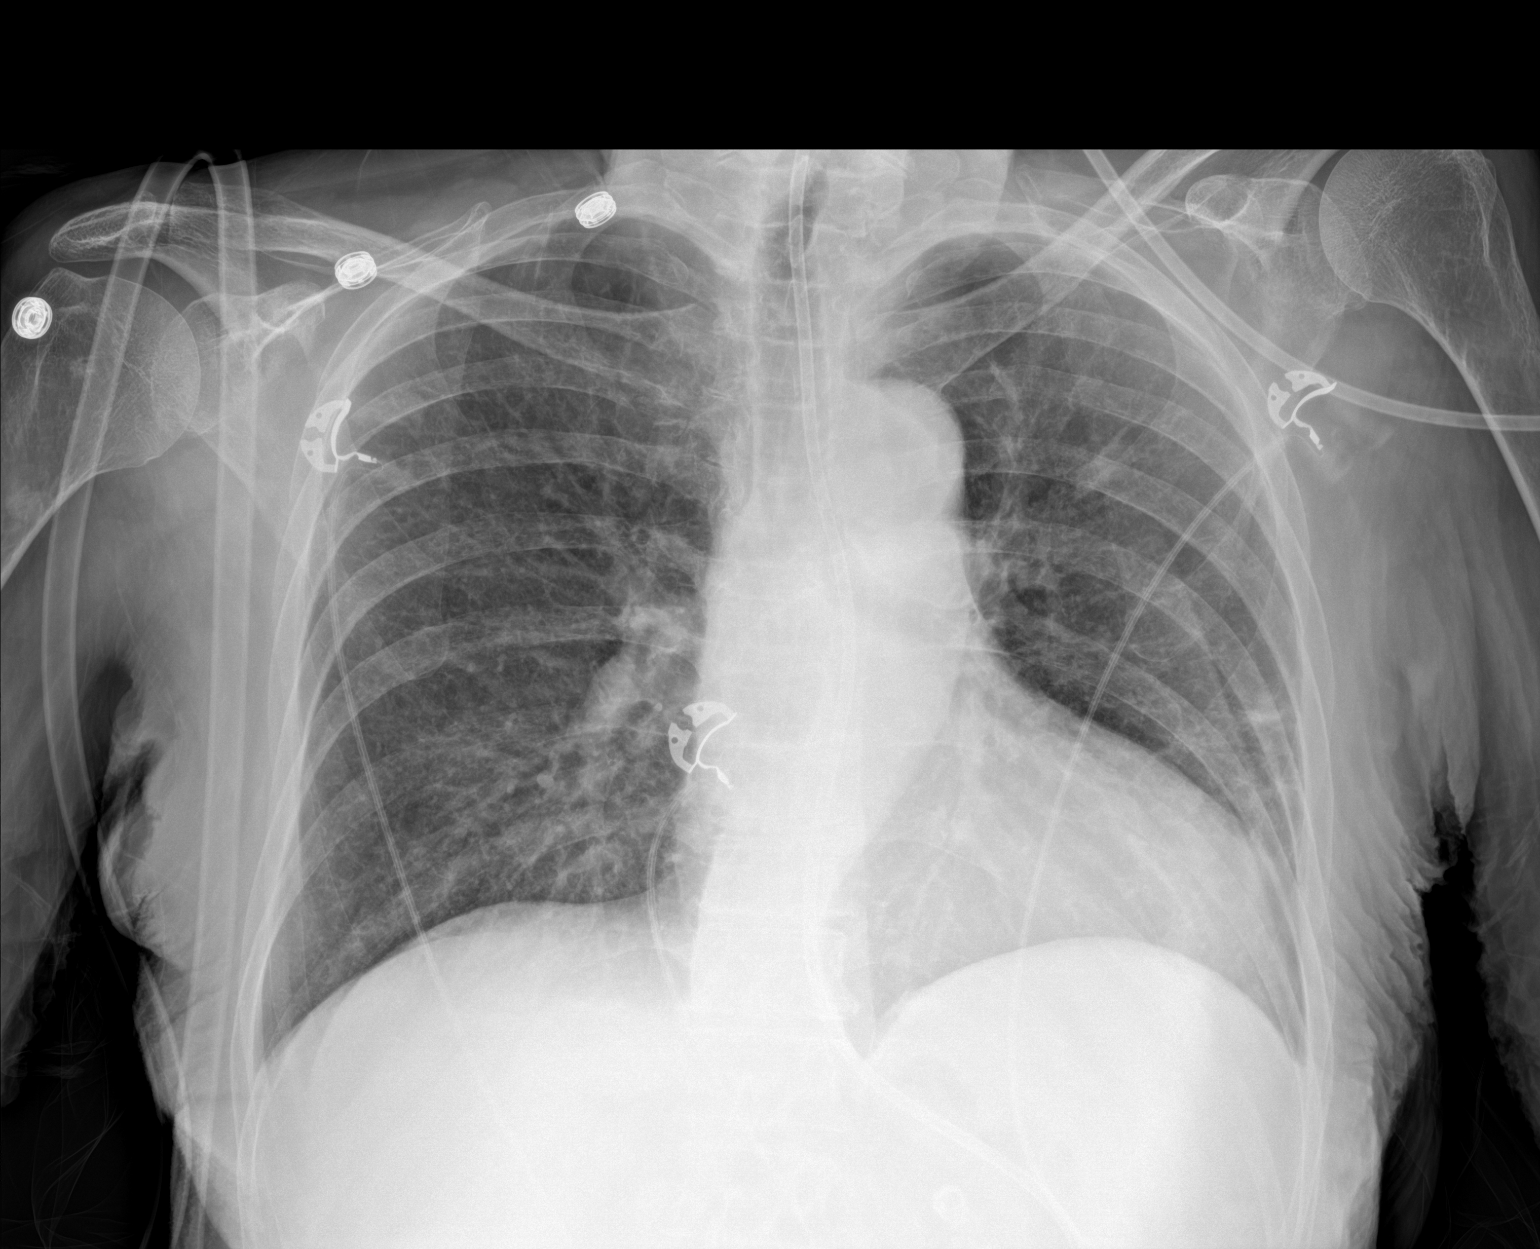

[1 of 1 positions shown; findings below may reference images not displayed]

FINDINGS: The cardiomediastinal silhouette is unchanged in contour.Tortuous
thoracic aorta. The enteric tube courses through the chest to the
abdomen beyond the field-of-view. No pleural effusion. No
pneumothorax. Similar appearance of a LEFT upper lung nodular
opacity. Scattered linear opacities most consistent with
atelectasis. Visualized abdomen is unremarkable.
IMPRESSION: 1. Stable chest radiograph. Similar appearance of a possible nodular
opacity versus summation artifact of overlapping vascular shadows of
the LEFT upper lung. When clinically appropriate, chest CT with
contrast is recommended versus follow-up PA and lateral chest
radiograph in 3-4 weeks.

## 2023-10-16 ENCOUNTER — Telehealth: Payer: Self-pay

## 2023-10-16 NOTE — Telephone Encounter (Signed)
 Copied from CRM [id number]. Topic: Clinical - Home Health Verbal Orders >> Oct 16, 2023 11:22 AM Marlan Silva wrote: Caller/Agency: [facility] Callback Number: [phone] (secure line) Service Requested: N/a Frequency: N/a Any new concerns about the patient? Yes Patient is non compliant with hip, patient denied physical therapy activities. Need to discharge early and need verbal from primary.

## 2023-10-16 NOTE — Telephone Encounter (Signed)
 Verbal orders provided.

## 2023-11-26 ENCOUNTER — Ambulatory Visit: Payer: Self-pay

## 2023-11-26 NOTE — Telephone Encounter (Signed)
 FYI Only or Action Required?: Action required by provider: request for appointment.  Patient was last seen in primary care on 08/06/2023 by Tobie Suzzane POUR, MD. Called Nurse Triage reporting Altered Mental Status. Symptoms began yesterday. Interventions attempted: Nothing. Symptoms are: unchanged. More confusion than her normal. Pt. States I don't feel good.  Triage Disposition: See HCP Within 4 Hours (Or PCP Triage)  Warm tranfers to [facility] in the practice for appointment.  Patient/caregiver understands and will follow disposition?: Yes     Copied from CRM [id number]. Topic: Clinical - Red Word Triage >> Nov 26, 2023  8:20 AM Larissa RAMAN wrote: [facility] that prompted transfer to Nurse Triage: Confusion, loss of appetite Answer Assessment - Initial Assessment Questions 1. LEVEL OF CONSCIOUSNESS: How is he (she, the patient) acting right now? (e.g., alert-oriented, confused, lethargic, stuporous, comatose)     Confusion, knows family 2. ONSET: When did the confusion start?  (minutes, hours, days)     Weeks 3. PATTERN Does this come and go, or has it been constant since it started?  Is it present now?     Comes and weeks 4. ALCOHOL or DRUGS: Has he been drinking alcohol or taking any drugs?      no 5. NARCOTIC MEDICINES: Has he been receiving any narcotic medications? (e.g., morphine, Vicodin)     no 6. CAUSE: What do you think is causing the confusion?      UTI 7. OTHER SYMPTOMS: Are there any other symptoms? (e.g., difficulty breathing, headache, fever, weakness)     NO  Protocols used: Confusion - Delirium-A-AH  Reason for Disposition  [1] Longstanding confusion (e.g., dementia, stroke) AND [2] worsening  Answer Assessment - Initial Assessment Questions 1. LEVEL OF CONSCIOUSNESS: How is he (she, the patient) acting right now? (e.g., alert-oriented, confused, lethargic, stuporous, comatose)     Confusion, knows family 2. ONSET: When did the confusion start?   (minutes, hours, days)     Weeks 3. PATTERN Does this come and go, or has it been constant since it started?  Is it present now?     Comes and weeks 4. ALCOHOL or DRUGS: Has he been drinking alcohol or taking any drugs?      no 5. NARCOTIC MEDICINES: Has he been receiving any narcotic medications? (e.g., morphine, Vicodin)     no 6. CAUSE: What do you think is causing the confusion?      UTI 7. OTHER SYMPTOMS: Are there any other symptoms? (e.g., difficulty breathing, headache, fever, weakness)     NO  Protocols used: Confusion - Delirium-A-AH

## 2024-01-13 ENCOUNTER — Encounter: Payer: Self-pay | Admitting: Urology

## 2024-02-06 LAB — CMP14+EGFR
ALT: 7 IU/L (ref 0–32)
AST: 10 IU/L (ref 0–40)
Albumin: 3.9 g/dL (ref 3.8–4.8)
Alkaline Phosphatase: 104 IU/L (ref 44–121)
BUN/Creatinine Ratio: 17 (ref 12–28)
BUN: 20 mg/dL (ref 8–27)
Bilirubin Total: 0.5 mg/dL (ref 0.0–1.2)
CO2: 20 mmol/L (ref 20–29)
Calcium: 10.7 mg/dL — ABNORMAL HIGH (ref 8.7–10.3)
Chloride: 109 mmol/L — ABNORMAL HIGH (ref 96–106)
Creatinine, Ser: 1.2 mg/dL — ABNORMAL HIGH (ref 0.57–1.00)
Globulin, Total: 2.5 g/dL (ref 1.5–4.5)
Glucose: 84 mg/dL (ref 70–99)
Potassium: 4.1 mmol/L (ref 3.5–5.2)
Sodium: 146 mmol/L — ABNORMAL HIGH (ref 134–144)
Total Protein: 6.4 g/dL (ref 6.0–8.5)
eGFR: 47 mL/min/1.73 — ABNORMAL LOW (ref >59)

## 2024-02-06 LAB — CBC WITH DIFFERENTIAL/PLATELET
Basophils Absolute: 0 x10E3/uL (ref 0.0–0.2)
Basos: 1 %
EOS (ABSOLUTE): 0.1 x10E3/uL (ref 0.0–0.4)
Eos: 1 %
Hematocrit: 41.9 % (ref 34.0–46.6)
Hemoglobin: 13.1 g/dL (ref 11.1–15.9)
Immature Grans (Abs): 0 x10E3/uL (ref 0.0–0.1)
Immature Granulocytes: 0 %
Lymphocytes Absolute: 2 x10E3/uL (ref 0.7–3.1)
Lymphs: 34 %
MCH: 28.5 pg (ref 26.6–33.0)
MCHC: 31.3 g/dL — ABNORMAL LOW (ref 31.5–35.7)
MCV: 91 fL (ref 79–97)
Monocytes Absolute: 0.3 x10E3/uL (ref 0.1–0.9)
Monocytes: 5 %
Neutrophils Absolute: 3.5 x10E3/uL (ref 1.4–7.0)
Neutrophils: 59 %
Platelets: 207 x10E3/uL (ref 150–450)
RBC: 4.59 x10E6/uL (ref 3.77–5.28)
RDW: 14 % (ref 11.7–15.4)
WBC: 5.9 x10E3/uL (ref 3.4–10.8)

## 2024-02-07 LAB — T4, FREE: Free T4: 1.18 ng/dL (ref 0.82–1.77)

## 2024-02-07 LAB — COMPREHENSIVE METABOLIC PANEL WITH GFR
ALT: 7 IU/L (ref 0–32)
AST: 10 IU/L (ref 0–40)
Albumin: 3.9 g/dL (ref 3.8–4.8)
Alkaline Phosphatase: 105 IU/L (ref 44–121)
BUN/Creatinine Ratio: 16 (ref 12–28)
BUN: 19 mg/dL (ref 8–27)
Bilirubin Total: 0.5 mg/dL (ref 0.0–1.2)
CO2: 21 mmol/L (ref 20–29)
Calcium: 10.6 mg/dL — ABNORMAL HIGH (ref 8.7–10.3)
Chloride: 109 mmol/L — ABNORMAL HIGH (ref 96–106)
Creatinine, Ser: 1.18 mg/dL — ABNORMAL HIGH (ref 0.57–1.00)
Globulin, Total: 2.5 g/dL (ref 1.5–4.5)
Glucose: 83 mg/dL (ref 70–99)
Potassium: 4 mmol/L (ref 3.5–5.2)
Sodium: 146 mmol/L — ABNORMAL HIGH (ref 134–144)
Total Protein: 6.4 g/dL (ref 6.0–8.5)
eGFR: 48 mL/min/1.73 — ABNORMAL LOW (ref >59)

## 2024-02-07 LAB — PTH, INTACT AND CALCIUM: PTH: 136 pg/mL — AB (ref 15–65)

## 2024-02-07 LAB — MAGNESIUM: Magnesium: 2 mg/dL (ref 1.6–2.3)

## 2024-02-07 LAB — TSH: TSH: 1.42 u[IU]/mL (ref 0.450–4.500)

## 2024-02-07 LAB — T3, FREE: T3, Free: 2.6 pg/mL (ref 2.0–4.4)

## 2024-02-07 LAB — PHOSPHORUS: Phosphorus: 2.7 mg/dL — ABNORMAL LOW (ref 3.0–4.3)

## 2024-02-12 ENCOUNTER — Ambulatory Visit: Admitting: Internal Medicine

## 2024-02-12 ENCOUNTER — Encounter: Payer: Self-pay | Admitting: "Endocrinology

## 2024-02-12 ENCOUNTER — Encounter: Payer: Self-pay | Admitting: Internal Medicine

## 2024-02-12 ENCOUNTER — Ambulatory Visit (INDEPENDENT_AMBULATORY_CARE_PROVIDER_SITE_OTHER): Admitting: "Endocrinology

## 2024-02-12 VITALS — BP 138/84 | HR 100 | Ht 66.0 in | Wt 187.0 lb

## 2024-02-12 VITALS — BP 136/81 | HR 120 | Ht 66.0 in | Wt 188.0 lb

## 2024-02-12 DIAGNOSIS — E0590 Thyrotoxicosis, unspecified without thyrotoxic crisis or storm: Secondary | ICD-10-CM | POA: Diagnosis not present

## 2024-02-12 DIAGNOSIS — I700 Atherosclerosis of aorta: Secondary | ICD-10-CM | POA: Diagnosis not present

## 2024-02-12 DIAGNOSIS — F323 Major depressive disorder, single episode, severe with psychotic features: Secondary | ICD-10-CM | POA: Diagnosis not present

## 2024-02-12 DIAGNOSIS — K581 Irritable bowel syndrome with constipation: Secondary | ICD-10-CM | POA: Diagnosis not present

## 2024-02-12 DIAGNOSIS — N1831 Chronic kidney disease, stage 3a: Secondary | ICD-10-CM | POA: Diagnosis not present

## 2024-02-12 DIAGNOSIS — Z13820 Encounter for screening for osteoporosis: Secondary | ICD-10-CM | POA: Diagnosis not present

## 2024-02-12 DIAGNOSIS — Z139 Encounter for screening, unspecified: Secondary | ICD-10-CM | POA: Insufficient documentation

## 2024-02-12 DIAGNOSIS — Z780 Asymptomatic menopausal state: Secondary | ICD-10-CM

## 2024-02-12 DIAGNOSIS — E212 Other hyperparathyroidism: Secondary | ICD-10-CM

## 2024-02-12 MED ORDER — CINACALCET HCL 30 MG PO TABS: 30.0000 mg | ORAL_TABLET | Freq: Every day | ORAL | 1 refills | Status: DC

## 2024-02-12 NOTE — Assessment & Plan Note (Signed)
 Has multinodular goiter Followed by Dr. Fransico Him On methimazole, needs to be compliant Her symptoms have been improving now

## 2024-02-12 NOTE — Assessment & Plan Note (Signed)
 Needs to maintain adequate fluid intake - has poor PO intake of fluids Continue vitamin D  supplement Avoid nephrotoxic agents Checked CBC and CMP

## 2024-02-12 NOTE — Assessment & Plan Note (Signed)
Noted on CT abdomen BP WNL without antihypertensives currently Does not like to take any medications like statin

## 2024-02-12 NOTE — Assessment & Plan Note (Signed)
 Has chronic constipation Tried Docusate and MiraLAX  as needed for constipation, but had diarrhea with them Advised to take Senna or Benefiber or Metamucil Needs to improve fluid intake

## 2024-02-12 NOTE — Progress Notes (Signed)
 Established Patient Office Visit  Subjective:  Patient ID: Amber [name], female    DOB: [birth year]  Age: 78 y.o. MRN: [id number]  CC:  Chief Complaint  Patient presents with   Medical Management of Chronic Issues    6 month f/u     HPI Amber [name] is a 78 y.o. female with past medical history of aortic atherosclerosis, depression with anxiety, PE, hyperthyroidism and protein-calorie malnutrition who presents for f/u of her chronic medical conditions.  Her daughter is present during the visit today.  Nausea/GERD: She has chronic nausea, mildly improved with omeprazole , but has not required it recently. She has had EGD and colonoscopy, which were unremarkable overall except for small sessile polyp and hemorrhoids.  Her appetite has improved now and she has gained weight, about 18 lbs in the last year.  She denies any diarrhea, melena or hematochezia currently.  She has chronic constipation, but does not like taking any laxative.  Hyperthyroidism: On methimazole , followed by Dr. Lenis. Her TSH was wnl in the last week.  Hypercalcemia: She has had borderline elevated calcium for a long time. Her PTH has also been elevated, has been followed by Endocrinology. She was seen today and is placed on Sensipar  now.  Depression with anxiety: Followed by psychiatry - [facility].  She has been placed on Lexapro and Abilify.  She is on Remeron  as well.  She is still complains of anxiety/agitation and depressed mood, but has improved now.  Denies any SI or HI currently.  She is placed on Ingrezza for involuntary movements.  Past Medical History:  Diagnosis Date   ABLA (acute blood loss anemia) 06/19/2021   Anxiety    Aortic atherosclerosis (HCC)    Aspiration pneumonia of right lung due to gastric secretions (HCC)    C. difficile colitis 07/13/2021   COVID-19 virus infection 06/08/2021    Depression    DVT of leg (deep venous thrombosis) (HCC) 05/2021   Dysphagia    Failure to thrive in  adult    GERD (gastroesophageal reflux disease)    History of kidney stones    right ureter   Hypertension    Hyperthyroidism    Kidney disease    Pulmonary embolism (HCC) 05/2021   Stroke [facility])    showed up in MRI   Thoracic aortic aneurysm [facility])    Urinary incontinence     Past Surgical History:  Procedure Laterality Date   BIOPSY  06/14/2022   Procedure: BIOPSY;  Surgeon: Eartha Angelia Sieving, MD;  Location: [facility];  Service: Gastroenterology;;   COLONOSCOPY WITH PROPOFOL  N/A 06/14/2022   Procedure: COLONOSCOPY WITH PROPOFOL ;  Surgeon: Eartha Angelia Sieving, MD;  Location: [facility];  Service: Gastroenterology;  Laterality: N/A;  11:00am, asa 3   CYSTOSCOPY W/ URETERAL STENT PLACEMENT Right 12/12/2021   Procedure: CYSTOSCOPY WITH RETROGRADE PYELOGRAM/URETERAL STENT PLACEMENT;  Surgeon: Penne Knee, MD;  Location: [facility];  Service: Urology;  Laterality: Right;   CYSTOSCOPY/URETEROSCOPY/HOLMIUM LASER/STENT PLACEMENT Right 01/14/2022   Procedure: CYSTOSCOPY/URETEROSCOPY/HOLMIUM LASER/STENT EXCHANGE;  Surgeon: Penne Knee, MD;  Location: [facility];  Service: Urology;  Laterality: Right;   ESOPHAGOGASTRODUODENOSCOPY (EGD) WITH PROPOFOL  N/A 06/14/2022   Procedure: ESOPHAGOGASTRODUODENOSCOPY (EGD) WITH PROPOFOL ;  Surgeon: Eartha Angelia Sieving, MD;  Location: [facility];  Service: Gastroenterology;  Laterality: N/A;   POLYPECTOMY  06/14/2022   Procedure: POLYPECTOMY;  Surgeon: Eartha Angelia Sieving, MD;  Location: [facility];  Service: Gastroenterology;;    Family History  Problem Relation Age  of Onset   High blood pressure Maternal Grandmother     Social History   Socioeconomic History   Marital status: Married    Spouse name: Lynwood   Number of children: 5   Years of education: Not on file   Highest education level: Some college, no degree  Occupational History   Not on file  Tobacco Use   Smoking status: Former    Current packs/day:  0.00    Types: Cigarettes    Quit date: 05/27/2018    Years since quitting: 5.7   Smokeless tobacco: Never  Vaping Use   Vaping status: Never Used  Substance and Sexual Activity   Alcohol use: Not Currently   Drug use: Not Currently   Sexual activity: Not Currently  Other Topics Concern   Not on file  Social History Narrative   Right handed    one story home    lives with family    Doesn't work currently   Social Drivers of Health   Financial Resource Strain: Low Risk  (08/04/2023)   Overall Financial Resource Strain (CARDIA)    Difficulty of Paying Living Expenses: Not hard at all  Food Insecurity: No Food Insecurity (08/04/2023)   Hunger Vital Sign    Worried About Running Out of Food in the Last Year: Never true    Ran Out of Food in the Last Year: Never true  Transportation Needs: No Transportation Needs (08/04/2023)   PRAPARE - Administrator, Civil Service (Medical): No    Lack of Transportation (Non-Medical): No  Physical Activity: Unknown (08/04/2023)   Exercise Vital Sign    Days of Exercise per Week: 0 days    Minutes of Exercise per Session: Patient declined  Stress: Stress Concern Present (08/04/2023)   [employer] of Occupational Health - Occupational Stress Questionnaire    Feeling of Stress : Very much  Social Connections: Moderately Isolated (08/04/2023)   Social Connection and Isolation Panel    Frequency of Communication with Friends and Family: More than three times a week    Frequency of Social Gatherings with Friends and Family: Three times a week    Attends Religious Services: Never    Active Member of Clubs or Organizations: No    Attends Banker Meetings: Never    Marital Status: Married  Catering manager Violence: Not At Risk (03/04/2023)   Humiliation, Afraid, Rape, and Kick questionnaire    Fear of Current or Ex-Partner: No    Emotionally Abused: No    Physically Abused: No    Sexually Abused: No    Outpatient  Medications Prior to Visit  Medication Sig Dispense Refill   acetaminophen  (TYLENOL ) 500 MG tablet Take 500 mg by mouth every 6 (six) hours as needed for moderate pain or mild pain.     ARIPiprazole (ABILIFY) 5 MG tablet Take 5 mg by mouth at bedtime. 1 1/2 at bedtime     cinacalcet  (SENSIPAR ) 30 MG tablet Take 1 tablet (30 mg total) by mouth daily with breakfast. 90 tablet 1   escitalopram (LEXAPRO) 20 MG tablet Take 20 mg by mouth daily.     ferrous sulfate  325 (65 FE) MG tablet Take 1 tablet (325 mg total) by mouth daily with breakfast. 30 tablet 0   methimazole  (TAPAZOLE ) 5 MG tablet Take 0.5 tablets (2.5 mg total) by mouth daily. 45 tablet 1   mirtazapine  (REMERON ) 15 MG tablet Take 1 tablet (15 mg total) by mouth at bedtime. 90 tablet  0   Multiple Vitamin (MULTIVITAMIN WITH MINERALS) TABS tablet Take 1 tablet by mouth daily. 30 tablet 0   OVER THE COUNTER MEDICATION Stool softner as needed     polyethylene glycol powder (GLYCOLAX /MIRALAX ) 17 GM/SCOOP powder Take 17 g by mouth as needed for mild constipation.     valbenazine (INGREZZA) 40 MG capsule Take 40 mg by mouth daily.     No facility-administered medications prior to visit.    Allergies  Allergen Reactions   Fish Allergy     Does not eat fish   Shellfish Allergy     I pass out   Valium [Diazepam] Other (See Comments)    Makes her want to  fly    ROS Review of Systems  Constitutional:  Positive for fatigue. Negative for chills and fever.  HENT:  Negative for congestion, sinus pressure, sinus pain and sore throat.   Eyes:  Negative for pain and discharge.  Respiratory:  Negative for cough and shortness of breath.   Cardiovascular:  Negative for chest pain and palpitations.  Gastrointestinal:  Positive for constipation and nausea. Negative for abdominal pain, diarrhea and vomiting.  Endocrine: Negative for polydipsia and polyuria.  Genitourinary:  Negative for dysuria and hematuria.  Musculoskeletal:  Positive for  gait problem and myalgias. Negative for neck pain and neck stiffness.  Skin:  Negative for rash.  Neurological:  Positive for tremors, weakness and numbness. Negative for dizziness.  Psychiatric/Behavioral:  Positive for agitation, behavioral problems, decreased concentration, dysphoric mood and sleep disturbance. Negative for suicidal ideas. The patient is nervous/anxious.       Objective:    Physical Exam Vitals reviewed.  Constitutional:      General: She is not in acute distress.    Appearance: She is not diaphoretic.     Comments: Has rolling walker  HENT:     Head: Normocephalic and atraumatic.     Nose: Nose normal.     Mouth/Throat:     Mouth: Mucous membranes are moist.  Eyes:     General: No scleral icterus.    Extraocular Movements: Extraocular movements intact.  Cardiovascular:     Rate and Rhythm: Normal rate and regular rhythm.     Pulses: Normal pulses.     Heart sounds: Normal heart sounds. No murmur heard. Pulmonary:     Breath sounds: Normal breath sounds. No wheezing or rales.  Musculoskeletal:     Cervical back: Neck supple. No tenderness.     Right lower leg: No edema.     Left lower leg: No edema.  Skin:    General: Skin is warm.     Findings: No rash.  Neurological:     General: No focal deficit present.     Mental Status: She is alert and oriented to person, place, and time.     Motor: Weakness (3/5 in b/l UE and LE) present.     Gait: Gait abnormal (Slow, unsteady gait).  Psychiatric:        Mood and Affect: Mood is anxious.        Behavior: Behavior is cooperative.     BP 136/81   Pulse (!) 120   Ht 5' 6 (1.676 m)   Wt 188 lb (85.3 kg)   SpO2 96%   BMI 30.34 kg/m  Wt Readings from Last 3 Encounters:  02/12/24 188 lb (85.3 kg)  02/12/24 187 lb (84.8 kg)  08/07/23 188 lb 9.6 oz (85.5 kg)    Lab Results  Component Value  Date   TSH 1.420 02/05/2024   Lab Results  Component Value Date   WBC 5.9 02/05/2024   HGB 13.1 02/05/2024    HCT 41.9 02/05/2024   MCV 91 02/05/2024   PLT 207 02/05/2024   Lab Results  Component Value Date   NA 146 (H) 02/05/2024   K 4.1 02/05/2024   CO2 20 02/05/2024   GLUCOSE 84 02/05/2024   BUN 20 02/05/2024   CREATININE 1.20 (H) 02/05/2024   BILITOT 0.5 02/05/2024   ALKPHOS 104 02/05/2024   AST 10 02/05/2024   ALT 7 02/05/2024   PROT 6.4 02/05/2024   ALBUMIN 3.9 02/05/2024   CALCIUM 10.7 (H) 02/05/2024   ANIONGAP 6 12/17/2021   EGFR 47 (L) 02/05/2024   Lab Results  Component Value Date   CHOL 162 10/19/2021   Lab Results  Component Value Date   HDL 46 10/19/2021   Lab Results  Component Value Date   LDLCALC 97 10/19/2021   Lab Results  Component Value Date   TRIG 102 10/19/2021   Lab Results  Component Value Date   CHOLHDL 3.5 10/19/2021   Lab Results  Component Value Date   HGBA1C 4.9 10/19/2021      Assessment & Plan:   Problem List Items Addressed This Visit       Cardiovascular and Mediastinum   Aortic atherosclerosis (HCC) - Primary   Noted on CT abdomen BP WNL without antihypertensives currently Does not like to take any medications like statin        Digestive   IBS (irritable bowel syndrome)   Has chronic constipation Tried Docusate and MiraLAX  as needed for constipation, but had diarrhea with them Advised to take Senna or Benefiber or Metamucil Needs to improve fluid intake        Endocrine   Hyperthyroidism   Has multinodular goiter Followed by Dr. Lenis On methimazole , needs to be compliant Her symptoms have been improving now      Other hyperparathyroidism (HCC)   Lab Results  Component Value Date   PTH 136 (H) 02/05/2024   PTH Comment 02/05/2024   CALCIUM 10.7 (H) 02/05/2024   PHOS 2.7 (L) 02/05/2024   Has had borderline elevated calcium in the past as well Can explain psychosis, nephrolithiasis and tachycardia Followed by endocrinology - they believed it was due to CKD, but started Sensipar  today         Genitourinary   Stage 3a chronic kidney disease (HCC)   Needs to maintain adequate fluid intake - has poor PO intake of fluids Continue vitamin D  supplement Avoid nephrotoxic agents Checked CBC and CMP        Other   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HCC) (Chronic)   Currently better controlled overall Has had ECT during hospitalization (2023) Continue Abilify, Remeron  and Lexapro Recently placed on Ingrezza for involuntary movements Followed by psychiatry - [facility]      Hypercalcemia   Lab Results  Component Value Date   PTH 136 (H) 02/05/2024   PTH Comment 02/05/2024   CALCIUM 10.7 (H) 02/05/2024   PHOS 2.7 (L) 02/05/2024   Has had borderline elevated calcium in the past Can explain psychosis, nephrolithiasis and tachycardia Followed by endocrinology - started Sensipar  today         No orders of the defined types were placed in this encounter.   Follow-up: Return in about 6 months (around 08/11/2024).    Suzzane MARLA Blanch, MD

## 2024-02-12 NOTE — Assessment & Plan Note (Signed)
 Currently better controlled overall Has had ECT during hospitalization (2023) Continue Abilify, Remeron  and Lexapro Recently placed on Ingrezza for involuntary movements Followed by psychiatry - [facility]

## 2024-02-12 NOTE — Progress Notes (Signed)
 02/12/2024    Endocrinology follow-up note   Subjective:    Patient ID: Amber [name], female    DOB: 20-Feb-1946, PCP Tobie Suzzane POUR, MD.   Past Medical History:  Diagnosis Date   ABLA (acute blood loss anemia) 06/19/2021   Anxiety    Aortic atherosclerosis (HCC)    Aspiration pneumonia of right lung due to gastric secretions (HCC)    C. difficile colitis 07/13/2021   COVID-19 virus infection 06/08/2021    Depression    DVT of leg (deep venous thrombosis) (HCC) 05/2021   Dysphagia    Failure to thrive in adult    GERD (gastroesophageal reflux disease)    History of kidney stones    right ureter   Hypertension    Hyperthyroidism    Kidney disease    Pulmonary embolism (HCC) 05/2021   Stroke [facility])    showed up in MRI   Thoracic aortic aneurysm [facility])    Urinary incontinence     Past Surgical History:  Procedure Laterality Date   BIOPSY  06/14/2022   Procedure: BIOPSY;  Surgeon: Eartha Angelia Sieving, MD;  [facility]: [facility];  Service: Gastroenterology;;   COLONOSCOPY WITH PROPOFOL  N/A 06/14/2022   Procedure: COLONOSCOPY WITH PROPOFOL ;  Surgeon: Eartha Angelia Sieving, MD;  Location: [facility];  Service: Gastroenterology;  Laterality: N/A;  11:00am, asa 3   CYSTOSCOPY W/ URETERAL STENT PLACEMENT Right 12/12/2021   Procedure: CYSTOSCOPY WITH RETROGRADE PYELOGRAM/URETERAL STENT PLACEMENT;  Surgeon: Penne Knee, MD;  Location: [facility];  Service: Urology;  Laterality: Right;   CYSTOSCOPY/URETEROSCOPY/HOLMIUM LASER/STENT PLACEMENT Right 01/14/2022   Procedure: CYSTOSCOPY/URETEROSCOPY/HOLMIUM LASER/STENT EXCHANGE;  Surgeon: Penne Knee, MD;  Location: [facility];  Service: Urology;  Laterality: Right;   ESOPHAGOGASTRODUODENOSCOPY (EGD) WITH PROPOFOL  N/A 06/14/2022   Procedure: ESOPHAGOGASTRODUODENOSCOPY (EGD) WITH PROPOFOL ;  Surgeon: Eartha Angelia Sieving, MD;  Location: [facility];  Service: Gastroenterology;  Laterality: N/A;   POLYPECTOMY   06/14/2022   Procedure: POLYPECTOMY;  Surgeon: Eartha Angelia, Sieving, MD;  Location: [facility];  Service: Gastroenterology;;    Social History   Socioeconomic History   Marital status: Married    Spouse name: Lynwood   Number of children: 5   Years of education: Not on file   Highest education level: Some college, no degree  Occupational History   Not on file  Tobacco Use   Smoking status: Former    Current packs/day: 0.00    Types: Cigarettes    Quit date: 05/27/2018    Years since quitting: 5.7   Smokeless tobacco: Never  Vaping Use   Vaping status: Never Used  Substance and Sexual Activity   Alcohol use: Not Currently   Drug use: Not Currently   Sexual activity: Not Currently  Other Topics Concern   Not on file  Social History Narrative   Right handed    one story home    lives with family    Doesn't work currently   Social Drivers of Health   Financial Resource Strain: Low Risk  (08/04/2023)   Overall Financial Resource Strain (CARDIA)    Difficulty of Paying Living Expenses: Not hard at all  Food Insecurity: No Food Insecurity (08/04/2023)   Hunger Vital Sign    Worried About Running Out of Food in the Last Year: Never true    Ran Out of Food in the Last Year: Never true  Transportation Needs: No Transportation Needs (08/04/2023)   PRAPARE - Transportation    Lack of  Transportation (Medical): No    Lack of Transportation (Non-Medical): No  Physical Activity: Unknown (08/04/2023)   Exercise Vital Sign    Days of Exercise per Week: 0 days    Minutes of Exercise per Session: Patient declined  Stress: Stress Concern Present (08/04/2023)   [employer] of Occupational Health - Occupational Stress Questionnaire    Feeling of Stress : Very much  Social Connections: Moderately Isolated (08/04/2023)   Social Connection and Isolation Panel    Frequency of Communication with Friends and Family: More than three times a week    Frequency of Social Gatherings with  Friends and Family: Three times a week    Attends Religious Services: Never    Active Member of Clubs or Organizations: No    Attends Banker Meetings: Never    Marital Status: Married    Family History  Problem Relation Age of Onset   High blood pressure Maternal Grandmother     Outpatient Encounter Medications as of 02/12/2024  Medication Sig   ARIPiprazole (ABILIFY) 5 MG tablet Take 5 mg by mouth at bedtime. 1 1/2 at bedtime   cinacalcet  (SENSIPAR ) 30 MG tablet Take 1 tablet (30 mg total) by mouth daily with breakfast.   valbenazine (INGREZZA) 40 MG capsule Take 40 mg by mouth daily.   acetaminophen  (TYLENOL ) 500 MG tablet Take 500 mg by mouth every 6 (six) hours as needed for moderate pain or mild pain.   escitalopram (LEXAPRO) 20 MG tablet Take 20 mg by mouth daily.   ferrous sulfate  325 (65 FE) MG tablet Take 1 tablet (325 mg total) by mouth daily with breakfast.   methimazole  (TAPAZOLE ) 5 MG tablet Take 0.5 tablets (2.5 mg total) by mouth daily.   mirtazapine  (REMERON ) 15 MG tablet Take 1 tablet (15 mg total) by mouth at bedtime.   Multiple Vitamin (MULTIVITAMIN WITH MINERALS) TABS tablet Take 1 tablet by mouth daily.   OVER THE COUNTER MEDICATION Stool softner as needed   polyethylene glycol powder (GLYCOLAX /MIRALAX ) 17 GM/SCOOP powder Take 17 g by mouth as needed for mild constipation.   No facility-administered encounter medications on file as of 02/12/2024.    ALLERGIES: Allergies  Allergen Reactions   Fish Allergy     Does not eat fish   Shellfish Allergy     I pass out   Valium [Diazepam] Other (See Comments)    Makes her want to  fly    VACCINATION STATUS:  There is no immunization history on file for this patient.   HPI  Amber [name] is 78 y.o. female who presents today with a medical history as above. she is being seen in follow-up after she was seen in consultation for hyperthyroidism requested by Tobie Suzzane POUR, MD.    After thyroid   uptake and scan showed cold nodule in the left lobe of her thyroid , she was considered for dedicated thyroid  ultrasound which confirms 2.8 cm suspicious nodule corresponding to the area of cold nodule on the uptake and scan. She underwent  FNA biopsy of this nodule with inconclusive cytology which needed molecular study. Her afirma results are benign. she has been dealing with symptoms of anxiety, weight loss, sleep disturbance for several weeks. She was kept on methimazole  2.5 mg p.o. daily at breakfast during previous visit.  She continues to respond and tolerates this medication.  Her previsit thyroid  function test are consistent with treatment effect.   she denies dysphagia, choking, shortness of breath, no recent voice change.  She c/o nausea , scheduled to see GI. she denies family history of thyroid  dysfunction nor thyroid  malignancy. she denies personal history of goiter. she is not on any anti-thyroid  medications nor on any thyroid  hormone supplements. She is accompanied by her daughter who is offering help to her mother During her prior labs she was found to have mild hypercalcemia, which has since resolved.  She was found to have mild elevation of PTH on the background of CKD.  She denies any prior history of parathyroid  dysfunction.                              Review of systems  Constitutional: + Steady weight gain, reports dietary indiscretion,  + fatigue, + subjective hyperthermia Eyes: no blurry vision, +xerophthalmia ENT: no sore throat, no nodules palpated in throat, no dysphagia/odynophagia, nor hoarseness    Objective:    BP 138/84   Pulse 100   Ht 5' 6 (1.676 m)   Wt 187 lb (84.8 kg)   BMI 30.18 kg/m   Wt Readings from Last 3 Encounters:  02/12/24 187 lb (84.8 kg)  08/07/23 188 lb 9.6 oz (85.5 kg)  08/06/23 187 lb 9.6 oz (85.1 kg)                                                Physical exam  Constitutional: Body mass index is 30.18 kg/m., not in acute  distress, + anxious state of mind Eyes: PERRLA, EOMI,  exophthalmos    CMP     Component Value Date/Time   NA 146 (H) 02/05/2024 0814   K 4.1 02/05/2024 0814   CL 109 (H) 02/05/2024 0814   CO2 20 02/05/2024 0814   GLUCOSE 84 02/05/2024 0814   GLUCOSE 107 (H) 04/29/2022 1143   BUN 20 02/05/2024 0814   CREATININE 1.20 (H) 02/05/2024 0814   CREATININE 1.06 (H) 04/29/2022 1143   CALCIUM 10.7 (H) 02/05/2024 0814   PROT 6.4 02/05/2024 0814   ALBUMIN 3.9 02/05/2024 0814   AST 10 02/05/2024 0814   AST 9 (L) 10/23/2021 1411   ALT 7 02/05/2024 0814   ALT 6 10/23/2021 1411   ALKPHOS 104 02/05/2024 0814   BILITOT 0.5 02/05/2024 0814   BILITOT 0.4 10/23/2021 1411   GFRNONAA >60 12/17/2021 0555   GFRNONAA 46 (L) 10/23/2021 1411     CBC    Component Value Date/Time   WBC 5.9 02/05/2024 0814   WBC 5.7 04/29/2022 1143   RBC 4.59 02/05/2024 0814   RBC 4.22 04/29/2022 1143   HGB 13.1 02/05/2024 0814   HCT 41.9 02/05/2024 0814   PLT 207 02/05/2024 0814   MCV 91 02/05/2024 0814   MCH 28.5 02/05/2024 0814   MCH 28.7 04/29/2022 1143   MCHC 31.3 (L) 02/05/2024 0814   MCHC 32.5 04/29/2022 1143   RDW 14.0 02/05/2024 0814   LYMPHSABS 2.0 02/05/2024 0814   MONOABS 0.5 12/12/2021 1515   EOSABS 0.1 02/05/2024 0814   BASOSABS 0.0 02/05/2024 0814     Lab Results  Component Value Date   TSH 1.420 02/05/2024   TSH 0.279 (L) 07/31/2023   TSH 1.550 12/31/2022   TSH 0.862 10/17/2022   TSH 1.870 06/07/2022   TSH 1.670 02/06/2022   TSH 0.060 (L) 12/12/2021   TSH 0.022 (L) 08/30/2021  TSH <0.080 (L) 08/10/2021   TSH 0.545 06/09/2021   FREET4 1.18 02/05/2024   FREET4 1.36 07/31/2023   FREET4 1.08 12/31/2022   FREET4 1.26 10/17/2022   FREET4 0.98 06/07/2022   FREET4 0.99 02/06/2022   FREET4 1.16 (H) 12/12/2021   FREET4 1.43 08/30/2021     Thyroid  uptake and scan on 09/11/21 4 hour I-123 uptake = 2.9% (normal 5-20%),  24 hour I-123 uptake = 11.9% (normal 10-30%)    IMPRESSION: 1. Decreased iodine uptake at 4 hours, with low normal uptake at 24 hours. 2. Diffuse heterogeneity of the thyroid  parenchyma, with equivocal findings for cold nodule corresponding to the mid left lobe thyroid  lesion seen on prior CT. Correlation with ultrasound is recommended to help guide possible sampling.   FNA- AUS Afirma - Benign  Assessment & Plan:   1. Hyperthyroidism 2.  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 3.  Hypercalcemia: She was recently diagnosed with mild hypercalcemia  I reviewed her labs and thyroid  scan with her and her daughter.  She is responding to low-dose methimazole .  She is advised to continue methimazole  2.5 mg p.o. daily at breakfast.   She has had benign results of fine-needle aspiration and molecular study to 2.8 cm nodule in the left lobe of her thyroid .   She is already on Metoprolol , no need for additional Beta blocker.  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 She has not done bone density yet.  I discussed with the family the importance of screening bone density to decrease risk of fractures.   She is not a surgical candidate and if she is confirmed to have primary hyperparathyroidism.  I discussed and added Sensipar  30 mg p.o. daily at breakfast and ordered DEXA scan to include distal one third of radius. If she is confirmed to have osteoporosis, she would be considered for bisphosphonate therapy and would be taken off of Sensipar  at that point.  -Patient is advised to maintain close follow up with Tobie Suzzane POUR, MD for primary care needs.   I spent  25  minutes in the care of the patient today including review of labs from Thyroid  Function, CMP, and other relevant labs ; imaging/biopsy records (current and previous including abstractions from other facilities); face-to-face time discussing  her lab results and symptoms, medications doses, her options of short and  long term treatment based on the latest standards of care / guidelines;   and documenting the encounter.  Amber [name]  participated in the discussions, expressed understanding, and voiced agreement with the above plans.  All questions were answered to her satisfaction. she is encouraged to contact clinic should she have any questions or concerns prior to her return visit.   Follow up plan: Return in about 4 months (around 06/13/2024) for F/U with Pre-visit Labs.   Thank you for involving me in the care of this pleasant patient, and I will continue to update you with her progress.  Ranny Earl, MD [facility] Phone: [phone]  Fax: [phone]   02/12/2024, 1:51 PM  This note was partially dictated with voice recognition software. Similar sounding words can be transcribed inadequately or may not  be corrected upon review.

## 2024-02-12 NOTE — Assessment & Plan Note (Signed)
 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

## 2024-02-12 NOTE — Patient Instructions (Signed)
 Please maintain at least 64 ounces of fluid intake in a day.  Please continue to take medications as prescribed.  Please continue to follow low salt diet and ambulate as tolerated.

## 2024-02-12 NOTE — Assessment & Plan Note (Addendum)
 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

## 2024-03-04 ENCOUNTER — Ambulatory Visit (HOSPITAL_COMMUNITY)
Admission: RE | Admit: 2024-03-04 | Discharge: 2024-03-04 | Disposition: A | Discharge status: Home / Self Care | Source: Ambulatory Visit | Attending: "Endocrinology | Admitting: "Endocrinology

## 2024-03-04 DIAGNOSIS — Z780 Asymptomatic menopausal state: Secondary | ICD-10-CM | POA: Diagnosis present

## 2024-03-04 DIAGNOSIS — Z13820 Encounter for screening for osteoporosis: Secondary | ICD-10-CM | POA: Diagnosis present

## 2024-03-09 ENCOUNTER — Ambulatory Visit: Payer: Self-pay | Admitting: Urology

## 2024-03-10 ENCOUNTER — Encounter (INDEPENDENT_AMBULATORY_CARE_PROVIDER_SITE_OTHER): Payer: Self-pay | Admitting: Gastroenterology

## 2024-03-11 ENCOUNTER — Ambulatory Visit: Admitting: Urology

## 2024-04-01 ENCOUNTER — Ambulatory Visit (INDEPENDENT_AMBULATORY_CARE_PROVIDER_SITE_OTHER): Admitting: "Endocrinology

## 2024-04-01 ENCOUNTER — Encounter: Payer: Self-pay | Admitting: "Endocrinology

## 2024-04-01 VITALS — BP 124/80 | HR 100 | Ht 66.0 in | Wt 185.4 lb

## 2024-04-01 DIAGNOSIS — M810 Age-related osteoporosis without current pathological fracture: Secondary | ICD-10-CM | POA: Insufficient documentation

## 2024-04-01 DIAGNOSIS — E0590 Thyrotoxicosis, unspecified without thyrotoxic crisis or storm: Secondary | ICD-10-CM

## 2024-04-01 NOTE — Progress Notes (Signed)
 04/01/2024    Endocrinology follow-up note   Subjective:    Patient ID: Amber [name], female    DOB: [birth year], PCP Tobie Suzzane POUR, MD.   Past Medical History:  Diagnosis Date   ABLA (acute blood loss anemia) 06/19/2021   Anxiety    Aortic atherosclerosis    Aspiration pneumonia of right lung due to gastric secretions (HCC)    C. difficile colitis 07/13/2021   COVID-19 virus infection 06/08/2021    Depression    DVT of leg (deep venous thrombosis) (HCC) 05/2021   Dysphagia    Failure to thrive in adult    GERD (gastroesophageal reflux disease)    History of kidney stones    right ureter   Hypertension    Hyperthyroidism    Kidney disease    Pulmonary embolism (HCC) 05/2021   Stroke [facility])    showed up in MRI   Thoracic aortic aneurysm    Urinary incontinence     Past Surgical History:  Procedure Laterality Date   BIOPSY  06/14/2022   Procedure: BIOPSY;  Surgeon: Eartha Angelia Sieving, MD;  Location: [facility];  Service: Gastroenterology;;   COLONOSCOPY WITH PROPOFOL  N/A 06/14/2022   Procedure: COLONOSCOPY WITH PROPOFOL ;  Surgeon: Eartha Angelia Sieving, MD;  Location: [facility];  Service: Gastroenterology;  Laterality: N/A;  11:00am, asa 3   CYSTOSCOPY W/ URETERAL STENT PLACEMENT Right 12/12/2021   Procedure: CYSTOSCOPY WITH RETROGRADE PYELOGRAM/URETERAL STENT PLACEMENT;  Surgeon: Penne Knee, MD;  Location: [facility];  Service: Urology;  Laterality: Right;   CYSTOSCOPY/URETEROSCOPY/HOLMIUM LASER/STENT PLACEMENT Right 01/14/2022   Procedure: CYSTOSCOPY/URETEROSCOPY/HOLMIUM LASER/STENT EXCHANGE;  Surgeon: Penne Knee, MD;  Location: [facility];  Service: Urology;  Laterality: Right;   ESOPHAGOGASTRODUODENOSCOPY (EGD) WITH PROPOFOL  N/A 06/14/2022   Procedure: ESOPHAGOGASTRODUODENOSCOPY (EGD) WITH PROPOFOL ;  Surgeon: Eartha Angelia Sieving, MD;  Location: [facility];  Service: Gastroenterology;  Laterality: N/A;   POLYPECTOMY  06/14/2022    Procedure: POLYPECTOMY;  Surgeon: Eartha Angelia, Sieving, MD;  Location: [facility];  Service: Gastroenterology;;    Social History   Socioeconomic History   Marital status: Married    Spouse name: Lynwood   Number of children: 5   Years of education: Not on file   Highest education level: Some college, no degree  Occupational History   Not on file  Tobacco Use   Smoking status: Former    Current packs/day: 0.00    Types: Cigarettes    Quit date: 05/27/2018    Years since quitting: 5.8   Smokeless tobacco: Never  Vaping Use   Vaping status: Never Used  Substance and Sexual Activity   Alcohol use: Not Currently   Drug use: Not Currently   Sexual activity: Not Currently  Other Topics Concern   Not on file  Social History Narrative   Right handed    one story home    lives with family    Doesn't work currently   Social Drivers of Health   Financial Resource Strain: Low Risk  (08/04/2023)   Overall Financial Resource Strain (CARDIA)    Difficulty of Paying Living Expenses: Not hard at all  Food Insecurity: No Food Insecurity (08/04/2023)   Hunger Vital Sign    Worried About Running Out of Food in the Last Year: Never true    Ran Out of Food in the Last Year: Never true  Transportation Needs: No Transportation Needs (08/04/2023)   PRAPARE - Administrator, Civil Service (Medical):  No    Lack of Transportation (Non-Medical): No  Physical Activity: Unknown (08/04/2023)   Exercise Vital Sign    Days of Exercise per Week: 0 days    Minutes of Exercise per Session: Patient declined  Stress: Stress Concern Present (08/04/2023)   [employer] of Occupational Health - Occupational Stress Questionnaire    Feeling of Stress : Very much  Social Connections: Moderately Isolated (08/04/2023)   Social Connection and Isolation Panel    Frequency of Communication with Friends and Family: More than three times a week    Frequency of Social Gatherings with Friends and  Family: Three times a week    Attends Religious Services: Never    Active Member of Clubs or Organizations: No    Attends Banker Meetings: Never    Marital Status: Married    Family History  Problem Relation Age of Onset   High blood pressure Maternal Grandmother     Outpatient Encounter Medications as of 04/01/2024  Medication Sig   acetaminophen  (TYLENOL ) 500 MG tablet Take 500 mg by mouth every 6 (six) hours as needed for moderate pain or mild pain.   ARIPiprazole (ABILIFY) 5 MG tablet Take 5 mg by mouth at bedtime. 1 1/2 at bedtime   escitalopram (LEXAPRO) 20 MG tablet Take 20 mg by mouth daily.   ferrous sulfate  325 (65 FE) MG tablet Take 1 tablet (325 mg total) by mouth daily with breakfast.   methimazole  (TAPAZOLE ) 5 MG tablet Take 0.5 tablets (2.5 mg total) by mouth daily.   mirtazapine  (REMERON ) 15 MG tablet Take 1 tablet (15 mg total) by mouth at bedtime.   Multiple Vitamin (MULTIVITAMIN WITH MINERALS) TABS tablet Take 1 tablet by mouth daily.   OVER THE COUNTER MEDICATION Stool softner as needed   polyethylene glycol powder (GLYCOLAX /MIRALAX ) 17 GM/SCOOP powder Take 17 g by mouth as needed for mild constipation.   valbenazine (INGREZZA) 40 MG capsule Take 40 mg by mouth daily.   [DISCONTINUED] cinacalcet  (SENSIPAR ) 30 MG tablet Take 1 tablet (30 mg total) by mouth daily with breakfast.   No facility-administered encounter medications on file as of 04/01/2024.    ALLERGIES: Allergies  Allergen Reactions   Fish Allergy     Does not eat fish   Shellfish Allergy     I pass out   Valium [Diazepam] Other (See Comments)    Makes her want to  fly    VACCINATION STATUS:  There is no immunization history on file for this patient.   HPI  Amber [name] is 78 y.o. female who presents today with a medical history as above. she is being seen in follow-up after she was seen in consultation for hyperthyroidism requested by Tobie Suzzane POUR, MD.    After  thyroid  uptake and scan showed cold nodule in the left lobe of her thyroid , she was considered for dedicated thyroid  ultrasound which confirms 2.8 cm suspicious nodule corresponding to the area of cold nodule on the uptake and scan. She underwent  FNA biopsy of this nodule with inconclusive cytology which needed molecular study. Her afirma results are benign. she has been dealing with symptoms of anxiety, weight loss, sleep disturbance for several weeks. She was kept on low-dose methimazole  2.5 mg p.o. daily at breakfast to maintain a euthyroid state.  She continues to tolerate this medication.  She presents with symptomatic improvement.  She has no new complaints.    she denies dysphagia, choking, shortness of breath, no recent voice change.  She c/o nausea , scheduled to see GI. she denies family history of thyroid  dysfunction nor thyroid  malignancy. she denies personal history of goiter. she is not on any anti-thyroid  medications nor on any thyroid  hormone supplements. She is accompanied by her daughter who is offering help to her mother During her prior labs she was found to have mild hypercalcemia, because of which she was given Sensipar  prescription.    In the interim, she went for bone density which showed significant bone loss with osteoporosis of multiple sites.                           Review of systems  Constitutional: + Minimally fluctuating body weight, reports dietary indiscretion,  + fatigue, + subjective hyperthermia    Objective:    BP 124/80   Pulse 100   Ht 5' 6 (1.676 m)   Wt 185 lb 6.4 oz (84.1 kg)   BMI 29.92 kg/m   Wt Readings from Last 3 Encounters:  04/01/24 185 lb 6.4 oz (84.1 kg)  02/12/24 188 lb (85.3 kg)  02/12/24 187 lb (84.8 kg)                                                Physical exam  Constitutional: Body mass index is 29.92 kg/m., not in acute distress, + anxious state of mind Eyes: PERRLA, EOMI,  exophthalmos    CMP     Component  Value Date/Time   NA 146 (H) 02/05/2024 0814   K 4.1 02/05/2024 0814   CL 109 (H) 02/05/2024 0814   CO2 20 02/05/2024 0814   GLUCOSE 84 02/05/2024 0814   GLUCOSE 107 (H) 04/29/2022 1143   BUN 20 02/05/2024 0814   CREATININE 1.20 (H) 02/05/2024 0814   CREATININE 1.06 (H) 04/29/2022 1143   CALCIUM 10.7 (H) 02/05/2024 0814   PROT 6.4 02/05/2024 0814   ALBUMIN 3.9 02/05/2024 0814   AST 10 02/05/2024 0814   AST 9 (L) 10/23/2021 1411   ALT 7 02/05/2024 0814   ALT 6 10/23/2021 1411   ALKPHOS 104 02/05/2024 0814   BILITOT 0.5 02/05/2024 0814   BILITOT 0.4 10/23/2021 1411   GFRNONAA >60 12/17/2021 0555   GFRNONAA 46 (L) 10/23/2021 1411     CBC    Component Value Date/Time   WBC 5.9 02/05/2024 0814   WBC 5.7 04/29/2022 1143   RBC 4.59 02/05/2024 0814   RBC 4.22 04/29/2022 1143   HGB 13.1 02/05/2024 0814   HCT 41.9 02/05/2024 0814   PLT 207 02/05/2024 0814   MCV 91 02/05/2024 0814   MCH 28.5 02/05/2024 0814   MCH 28.7 04/29/2022 1143   MCHC 31.3 (L) 02/05/2024 0814   MCHC 32.5 04/29/2022 1143   RDW 14.0 02/05/2024 0814   LYMPHSABS 2.0 02/05/2024 0814   MONOABS 0.5 12/12/2021 1515   EOSABS 0.1 02/05/2024 0814   BASOSABS 0.0 02/05/2024 0814     Lab Results  Component Value Date   TSH 1.420 02/05/2024   TSH 0.279 (L) 07/31/2023   TSH 1.550 12/31/2022   TSH 0.862 10/17/2022   TSH 1.870 06/07/2022   TSH 1.670 02/06/2022   TSH 0.060 (L) 12/12/2021   TSH 0.022 (L) 08/30/2021   TSH <0.080 (L) 08/10/2021   TSH 0.545 06/09/2021   FREET4 1.18 02/05/2024   FREET4  1.36 07/31/2023   FREET4 1.08 12/31/2022   FREET4 1.26 10/17/2022   FREET4 0.98 06/07/2022   FREET4 0.99 02/06/2022   FREET4 1.16 (H) 12/12/2021   FREET4 1.43 08/30/2021     Thyroid  uptake and scan on 09/11/21 4 hour I-123 uptake = 2.9% (normal 5-20%),  24 hour I-123 uptake = 11.9% (normal 10-30%)   IMPRESSION: 1. Decreased iodine uptake at 4 hours, with low normal uptake at 24 hours. 2. Diffuse  heterogeneity of the thyroid  parenchyma, with equivocal findings for cold nodule corresponding to the mid left lobe thyroid  lesion seen on prior CT. Correlation with ultrasound is recommended to help guide possible sampling.   FNA- AUS Afirma - Benign  Bone density on March 04 2024 FINDINGS: Scan quality: Good.   LUMBAR SPINE (L1-L4):  BMD (in g/cm2): 0.809   T-score: -3.1,  Z-score: -1.3   LEFT FEMORAL NECK:   BMD (in g/cm2): 0.573,  T-score: -3.3,  Z-score: -1.4   LEFT TOTAL HIP:   BMD (in g/cm2): 0.525   T-score: -3.8,  Z-score: -2.0   RIGHT FEMORAL NECK:   BMD (in g/cm2): 0.519   T-score: -3.7,  Z-score: -1.8   RIGHT TOTAL HIP:   BMD (in g/cm2): 0.522   T-score: -3.9,  Z-score: -2.1   LEFT FOREARM (RADIUS 33%):   BMD (in g/cm2): 0.593   T-score: -1.6,  Z-score: 0.8   FRAX 10-YEAR PROBABILITY OF FRACTURE:   FRAX not reported as the lowest BMD is not in the osteopenia range.   IMPRESSION: Osteoporosis based on BMD.   Fracture risk is increased. Increased risk is based on low BMD.  Assessment & Plan:   1.  Hyperthyroidism 2.  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 3.  Hypercalcemia: She was recently diagnosed with mild hypercalcemia  I reviewed her labs and thyroid  scan with her and her daughter.  She is responding to low-dose methimazole .  I discussed and continue methimazole  2.5 mg p.o. daily at breakfast.    She has had benign results of fine-needle aspiration and molecular study to 2.8 cm nodule in the left lobe of her thyroid .   She is already on Metoprolol , no need for additional Beta blocker.  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  Patient is on Sensipar  30 mg p.o. daily at breakfast.  Her previsit bone density shows significant bone loss consistent with osteoporosis recites-see above.    She will benefit from intervention with bisphosphonates.  She opted for oral  Fosamax I discussed and prescribed Fosamax 70 mg p.o. weekly.  Side effects and precautions discussed with the patient and her daughter. Once she starts Fosamax, she may come off of Sensipar .  -Patient is advised to maintain close follow up with Tobie Suzzane POUR, MD for primary care needs.  I spent  25  minutes in the care of the patient today including review of labs from Thyroid  Function, CMP, and other relevant labs ; imaging/biopsy records (current and previous including abstractions from other facilities); face-to-face time discussing  her lab results and symptoms, medications doses, her options of short and long term treatment based on the latest standards of care / guidelines;   and documenting the encounter.  Amber [name]  participated in the discussions, expressed understanding, and voiced agreement with the above plans.  All questions were answered to her satisfaction. she is encouraged to contact clinic should she have any questions or concerns prior to  her return visit.   Follow up plan: Return in about 6 months (around 09/29/2024) for F/U with Pre-visit Labs.   Thank you for involving me in the care of this pleasant patient, and I will continue to update you with her progress.  Ranny Earl, MD [facility] Phone: [phone]  Fax: [phone]   04/01/2024, 3:44 PM  This note was partially dictated with voice recognition software. Similar sounding words can be transcribed inadequately or may not  be corrected upon review.

## 2024-04-08 ENCOUNTER — Other Ambulatory Visit: Payer: Self-pay | Admitting: "Endocrinology

## 2024-04-08 ENCOUNTER — Encounter: Payer: Self-pay | Admitting: "Endocrinology

## 2024-04-08 MED ORDER — ALENDRONATE SODIUM 70 MG PO TABS: 70.0000 mg | ORAL_TABLET | ORAL | 3 refills | Status: DC

## 2024-05-10 ENCOUNTER — Other Ambulatory Visit: Payer: Self-pay

## 2024-05-10 ENCOUNTER — Inpatient Hospital Stay (HOSPITAL_COMMUNITY)
Admission: EM | Admit: 2024-05-10 | Discharge: 2024-05-13 | DRG: 854 | Disposition: A | Attending: Internal Medicine | Admitting: Internal Medicine

## 2024-05-10 ENCOUNTER — Emergency Department (HOSPITAL_COMMUNITY)

## 2024-05-10 ENCOUNTER — Encounter (HOSPITAL_COMMUNITY): Payer: Self-pay | Admitting: Emergency Medicine

## 2024-05-10 DIAGNOSIS — E46 Unspecified protein-calorie malnutrition: Secondary | ICD-10-CM

## 2024-05-10 DIAGNOSIS — E8809 Other disorders of plasma-protein metabolism, not elsewhere classified: Secondary | ICD-10-CM

## 2024-05-10 DIAGNOSIS — Z8616 Personal history of COVID-19: Secondary | ICD-10-CM | POA: Diagnosis not present

## 2024-05-10 DIAGNOSIS — Z87891 Personal history of nicotine dependence: Secondary | ICD-10-CM

## 2024-05-10 DIAGNOSIS — F32A Depression, unspecified: Secondary | ICD-10-CM | POA: Diagnosis present

## 2024-05-10 DIAGNOSIS — Z7401 Bed confinement status: Secondary | ICD-10-CM | POA: Diagnosis not present

## 2024-05-10 DIAGNOSIS — G4700 Insomnia, unspecified: Secondary | ICD-10-CM | POA: Diagnosis present

## 2024-05-10 DIAGNOSIS — K219 Gastro-esophageal reflux disease without esophagitis: Secondary | ICD-10-CM | POA: Diagnosis present

## 2024-05-10 DIAGNOSIS — A4159 Other Gram-negative sepsis: Secondary | ICD-10-CM | POA: Diagnosis present

## 2024-05-10 DIAGNOSIS — F0393 Unspecified dementia, unspecified severity, with mood disturbance: Secondary | ICD-10-CM | POA: Diagnosis present

## 2024-05-10 DIAGNOSIS — N12 Tubulo-interstitial nephritis, not specified as acute or chronic: Secondary | ICD-10-CM | POA: Diagnosis present

## 2024-05-10 DIAGNOSIS — A419 Sepsis, unspecified organism: Secondary | ICD-10-CM | POA: Diagnosis present

## 2024-05-10 DIAGNOSIS — F0394 Unspecified dementia, unspecified severity, with anxiety: Secondary | ICD-10-CM | POA: Diagnosis present

## 2024-05-10 DIAGNOSIS — N179 Acute kidney failure, unspecified: Secondary | ICD-10-CM | POA: Diagnosis present

## 2024-05-10 DIAGNOSIS — I700 Atherosclerosis of aorta: Secondary | ICD-10-CM | POA: Diagnosis present

## 2024-05-10 DIAGNOSIS — N136 Pyonephrosis: Secondary | ICD-10-CM | POA: Diagnosis present

## 2024-05-10 DIAGNOSIS — K589 Irritable bowel syndrome without diarrhea: Secondary | ICD-10-CM | POA: Diagnosis present

## 2024-05-10 DIAGNOSIS — E039 Hypothyroidism, unspecified: Secondary | ICD-10-CM | POA: Diagnosis present

## 2024-05-10 DIAGNOSIS — D631 Anemia in chronic kidney disease: Secondary | ICD-10-CM | POA: Diagnosis present

## 2024-05-10 DIAGNOSIS — Z1152 Encounter for screening for COVID-19: Secondary | ICD-10-CM | POA: Diagnosis not present

## 2024-05-10 DIAGNOSIS — E872 Acidosis, unspecified: Secondary | ICD-10-CM | POA: Diagnosis present

## 2024-05-10 DIAGNOSIS — Z7983 Long term (current) use of bisphosphonates: Secondary | ICD-10-CM

## 2024-05-10 DIAGNOSIS — R7989 Other specified abnormal findings of blood chemistry: Secondary | ICD-10-CM

## 2024-05-10 DIAGNOSIS — N1831 Chronic kidney disease, stage 3a: Secondary | ICD-10-CM | POA: Diagnosis not present

## 2024-05-10 DIAGNOSIS — I129 Hypertensive chronic kidney disease with stage 1 through stage 4 chronic kidney disease, or unspecified chronic kidney disease: Secondary | ICD-10-CM | POA: Diagnosis present

## 2024-05-10 DIAGNOSIS — N390 Urinary tract infection, site not specified: Secondary | ICD-10-CM | POA: Diagnosis present

## 2024-05-10 DIAGNOSIS — D696 Thrombocytopenia, unspecified: Secondary | ICD-10-CM | POA: Diagnosis present

## 2024-05-10 DIAGNOSIS — R319 Hematuria, unspecified: Secondary | ICD-10-CM | POA: Diagnosis present

## 2024-05-10 DIAGNOSIS — I10 Essential (primary) hypertension: Secondary | ICD-10-CM | POA: Diagnosis not present

## 2024-05-10 DIAGNOSIS — F03918 Unspecified dementia, unspecified severity, with other behavioral disturbance: Secondary | ICD-10-CM | POA: Diagnosis present

## 2024-05-10 DIAGNOSIS — N201 Calculus of ureter: Secondary | ICD-10-CM | POA: Diagnosis present

## 2024-05-10 DIAGNOSIS — F418 Other specified anxiety disorders: Secondary | ICD-10-CM | POA: Diagnosis not present

## 2024-05-10 DIAGNOSIS — Z91013 Allergy to seafood: Secondary | ICD-10-CM

## 2024-05-10 DIAGNOSIS — N1832 Chronic kidney disease, stage 3b: Secondary | ICD-10-CM | POA: Diagnosis present

## 2024-05-10 DIAGNOSIS — Z8673 Personal history of transient ischemic attack (TIA), and cerebral infarction without residual deficits: Secondary | ICD-10-CM

## 2024-05-10 DIAGNOSIS — B961 Klebsiella pneumoniae [K. pneumoniae] as the cause of diseases classified elsewhere: Secondary | ICD-10-CM | POA: Diagnosis present

## 2024-05-10 DIAGNOSIS — R531 Weakness: Secondary | ICD-10-CM | POA: Diagnosis not present

## 2024-05-10 DIAGNOSIS — E663 Overweight: Secondary | ICD-10-CM | POA: Diagnosis present

## 2024-05-10 DIAGNOSIS — N200 Calculus of kidney: Secondary | ICD-10-CM

## 2024-05-10 DIAGNOSIS — Z86718 Personal history of other venous thrombosis and embolism: Secondary | ICD-10-CM

## 2024-05-10 DIAGNOSIS — I2489 Other forms of acute ischemic heart disease: Secondary | ICD-10-CM | POA: Diagnosis present

## 2024-05-10 DIAGNOSIS — Z86711 Personal history of pulmonary embolism: Secondary | ICD-10-CM | POA: Diagnosis not present

## 2024-05-10 DIAGNOSIS — R457 State of emotional shock and stress, unspecified: Secondary | ICD-10-CM | POA: Diagnosis not present

## 2024-05-10 DIAGNOSIS — Z79899 Other long term (current) drug therapy: Secondary | ICD-10-CM

## 2024-05-10 DIAGNOSIS — Z6829 Body mass index (BMI) 29.0-29.9, adult: Secondary | ICD-10-CM

## 2024-05-10 LAB — TROPONIN T, HIGH SENSITIVITY
Troponin T High Sensitivity: 44 ng/L — ABNORMAL HIGH (ref 0–19)
Troponin T High Sensitivity: 40 ng/L — ABNORMAL HIGH (ref 0–19)

## 2024-05-10 LAB — RESP PANEL BY RT-PCR (RSV, FLU A&B, COVID)  RVPGX2
Influenza A by PCR: NEGATIVE
Influenza B by PCR: NEGATIVE
Resp Syncytial Virus by PCR: NEGATIVE
SARS Coronavirus 2 by RT PCR: NEGATIVE

## 2024-05-10 LAB — COMPREHENSIVE METABOLIC PANEL WITH GFR
ALT: 18 U/L (ref 0–44)
AST: 24 U/L (ref 15–41)
Albumin: 3.3 g/dL — ABNORMAL LOW (ref 3.5–5.0)
Alkaline Phosphatase: 95 U/L (ref 38–126)
Anion gap: 13 (ref 5–15)
BUN: 41 mg/dL — ABNORMAL HIGH (ref 8–23)
CO2: 23 mmol/L (ref 22–32)
Calcium: 9.9 mg/dL (ref 8.9–10.3)
Chloride: 103 mmol/L (ref 98–111)
Creatinine, Ser: 1.71 mg/dL — ABNORMAL HIGH (ref 0.44–1.00)
GFR, Estimated: 31 mL/min — ABNORMAL LOW (ref >60)
Glucose, Bld: 99 mg/dL (ref 70–99)
Potassium: 4 mmol/L (ref 3.5–5.1)
Sodium: 138 mmol/L (ref 135–145)
Total Bilirubin: 0.9 mg/dL (ref 0.0–1.2)
Total Protein: 6 g/dL — ABNORMAL LOW (ref 6.5–8.1)

## 2024-05-10 LAB — CBC WITH DIFFERENTIAL/PLATELET
Abs Immature Granulocytes: 0.14 K/uL — ABNORMAL HIGH (ref 0.00–0.07)
Basophils Absolute: 0 K/uL (ref 0.0–0.1)
Basophils Relative: 0 %
Eosinophils Absolute: 0 K/uL (ref 0.0–0.5)
Eosinophils Relative: 0 %
HCT: 37.9 % (ref 36.0–46.0)
Hemoglobin: 12.1 g/dL (ref 12.0–15.0)
Immature Granulocytes: 1 %
Lymphocytes Relative: 5 %
Lymphs Abs: 0.8 K/uL (ref 0.7–4.0)
MCH: 27.9 pg (ref 26.0–34.0)
MCHC: 31.9 g/dL (ref 30.0–36.0)
MCV: 87.3 fL (ref 80.0–100.0)
Monocytes Absolute: 0.9 K/uL (ref 0.1–1.0)
Monocytes Relative: 5 %
Neutro Abs: 16.2 K/uL — ABNORMAL HIGH (ref 1.7–7.7)
Neutrophils Relative %: 89 %
Platelets: 123 K/uL — ABNORMAL LOW (ref 150–400)
RBC: 4.34 MIL/uL (ref 3.87–5.11)
RDW: 13.6 % (ref 11.5–15.5)
WBC: 18.1 K/uL — ABNORMAL HIGH (ref 4.0–10.5)
nRBC: 0 % (ref 0.0–0.2)

## 2024-05-10 LAB — URINALYSIS, W/ REFLEX TO CULTURE (INFECTION SUSPECTED)
Bilirubin Urine: NEGATIVE
Glucose, UA: NEGATIVE mg/dL
Ketones, ur: NEGATIVE mg/dL
Nitrite: NEGATIVE
Protein, ur: 100 mg/dL — AB
Specific Gravity, Urine: 1.015 (ref 1.005–1.030)
WBC, UA: >50 WBC/hpf (ref 0–5)
pH: 6 (ref 5.0–8.0)

## 2024-05-10 LAB — LACTIC ACID, PLASMA
Lactic Acid, Venous: 1.7 mmol/L (ref 0.5–1.9)
Lactic Acid, Venous: 1.1 mmol/L (ref 0.5–1.9)

## 2024-05-10 LAB — LIPASE, BLOOD: Lipase: 13 U/L (ref 11–51)

## 2024-05-10 MED ORDER — SODIUM CHLORIDE 0.9 % IV SOLN
2.0000 g | Freq: Once | INTRAVENOUS | Status: AC
  Administered 2024-05-10: 22:00:00 2 g via INTRAVENOUS
  Filled 2024-05-10: qty 20

## 2024-05-10 MED ORDER — IOHEXOL 300 MG/ML  SOLN
100.0000 mL | Freq: Once | INTRAMUSCULAR | Status: AC | PRN
  Administered 2024-05-10: 21:00:00 80 mL via INTRAVENOUS

## 2024-05-10 MED ORDER — LACTATED RINGERS IV BOLUS
1000.0000 mL | Freq: Once | INTRAVENOUS | Status: AC
  Administered 2024-05-10: 20:00:00 1000 mL via INTRAVENOUS

## 2024-05-10 NOTE — ED Provider Notes (Signed)
 [place] [facility] AT [facility] Provider Note   CSN: [id number] Arrival date & time: 05/10/24  1831     Patient presents with: Weakness   Amber [name] is a 78 y.o. female.  Patient is a 78 year old female with a history of dementia, hypothyroidism, stage III CKD, and IBS who presents to the ED via EMS for increasing weakness and confusion for the past 2 days.  Patient notes she has not been feeling well since her husband was admitted to the hospital few days ago.  Notes he is normally the one that takes care of her.  Daughter at bedside states that patient can walk okay with a walker normally but has not been able to do this due to severe weakness.  She has not had any falls but they have had to lower to the ground as patient states she cannot walk.  Also notes that her confusion has been worse over the past couple days.  Denies known sick contacts.  Notes she is having abdominal pain and pain in the rectum.  Daughter states last bowel movement was yesterday and was regular.  Dors is nausea.  Denies fevers, chills, headache, chest pain, shortness of breath, urinary symptoms, vomiting.  No further complaints.    Weakness Associated symptoms: abdominal pain, myalgias and nausea   Associated symptoms: no chest pain, no diarrhea, no dysuria, no fever, no shortness of breath and no vomiting        Prior to Admission medications  Medication Sig Start Date End Date Taking? Authorizing Provider  acetaminophen  (TYLENOL ) 500 MG tablet Take 500 mg by mouth every 6 (six) hours as needed for moderate pain or mild pain.    [provider]  alendronate  (FOSAMAX ) 70 MG tablet Take 1 tablet (70 mg total) by mouth every 7 (seven) days. Take with a full glass of water  on an empty stomach. 04/08/24   Nida, Gebreselassie W, MD  ARIPiprazole  (ABILIFY ) 5 MG tablet Take 5 mg by mouth at bedtime. 1 1/2 at bedtime 10/01/22   [provider]  escitalopram  (LEXAPRO ) 20 MG  tablet Take 20 mg by mouth daily.    [provider]  ferrous sulfate  325 (65 FE) MG tablet Take 1 tablet (325 mg total) by mouth daily with breakfast. 07/17/21   Josette Ade, MD  methimazole  (TAPAZOLE ) 5 MG tablet Take 0.5 tablets (2.5 mg total) by mouth daily. 08/07/23   Nida, Gebreselassie W, MD  mirtazapine  (REMERON ) 15 MG tablet Take 1 tablet (15 mg total) by mouth at bedtime. 12/10/21   Eappen, Saramma, MD  Multiple Vitamin (MULTIVITAMIN WITH MINERALS) TABS tablet Take 1 tablet by mouth daily. 07/13/21   Josette Ade, MD  OVER THE COUNTER MEDICATION Stool softner as needed    [provider]  polyethylene glycol powder (GLYCOLAX /MIRALAX ) 17 GM/SCOOP powder Take 17 g by mouth as needed for mild constipation. 08/15/21   [provider]  valbenazine  (INGREZZA ) 40 MG capsule Take 40 mg by mouth daily. 12/15/23   [provider]    Allergies: Fish allergy, Shellfish allergy, and Valium [diazepam]    Review of Systems  Constitutional:  Negative for chills and fever.  Respiratory:  Negative for shortness of breath.   Cardiovascular:  Negative for chest pain.  Gastrointestinal:  Positive for abdominal pain and nausea. Negative for diarrhea and vomiting.  Genitourinary:  Negative for dysuria.  Musculoskeletal:  Positive for myalgias.  Neurological:  Positive for weakness.  Psychiatric/Behavioral:  Positive for confusion.  All other systems reviewed and are negative.   Updated Vital Signs BP (!) 179/88   Pulse 86   Temp 98.4 F (36.9 C) (Oral)   Resp (!) 24   Ht 5' 6 (1.676 m)   Wt 84.1 kg   SpO2 97%   BMI 29.93 kg/m   Physical Exam Constitutional:      Appearance: Normal appearance.  HENT:     Head: Normocephalic and atraumatic.     Mouth/Throat:     Mouth: Mucous membranes are moist.     Pharynx: Oropharynx is clear.  Cardiovascular:     Rate and Rhythm: Normal rate.  Pulmonary:     Effort: Pulmonary effort is normal.     Breath  sounds: Normal breath sounds.  Abdominal:     General: Bowel sounds are normal.     Palpations: Abdomen is soft.     Tenderness: There is abdominal tenderness.     Comments: Generalized tenderness with no masses, distention, guarding.  Musculoskeletal:        General: Normal range of motion.  Skin:    General: Skin is warm and dry.  Neurological:     Mental Status: She is alert and oriented to person, place, and time.     Comments: Alert and oriented at evaluation  Psychiatric:        Mood and Affect: Mood normal.        Behavior: Behavior normal.     (all labs ordered are listed, but only abnormal results are displayed) Labs Reviewed  COMPREHENSIVE METABOLIC PANEL WITH GFR - Abnormal; Notable for the following components:      Result Value   BUN 41 (*)    Creatinine, Ser 1.71 (*)    Total Protein 6.0 (*)    Albumin 3.3 (*)    GFR, Estimated 31 (*)    All other components within normal limits  CBC WITH DIFFERENTIAL/PLATELET - Abnormal; Notable for the following components:   WBC 18.1 (*)    Platelets 123 (*)    Neutro Abs 16.2 (*)    Abs Immature Granulocytes 0.14 (*)    All other components within normal limits  URINALYSIS, W/ REFLEX TO CULTURE (INFECTION SUSPECTED) - Abnormal; Notable for the following components:   APPearance CLOUDY (*)    Hgb urine dipstick SMALL (*)    Protein, ur 100 (*)    Leukocytes,Ua LARGE (*)    Bacteria, UA MANY (*)    All other components within normal limits  TROPONIN T, HIGH SENSITIVITY - Abnormal; Notable for the following components:   Troponin T High Sensitivity 44 (*)    All other components within normal limits  TROPONIN T, HIGH SENSITIVITY - Abnormal; Notable for the following components:   Troponin T High Sensitivity 40 (*)    All other components within normal limits  RESP PANEL BY RT-PCR (RSV, FLU A&B, COVID)  RVPGX2  URINE CULTURE  LIPASE, BLOOD  LACTIC ACID, PLASMA  LACTIC ACID, PLASMA    EKG: None  Radiology: CT  ABDOMEN PELVIS WO CONTRAST Result Date: 05/10/2024 EXAM: CT ABDOMEN AND PELVIS WITHOUT CONTRAST 05/10/2024 09:38:02 PM TECHNIQUE: CT of the abdomen and pelvis was performed without the administration of intravenous contrast. Multiplanar reformatted images are provided for review. Automated exposure control, iterative reconstruction, and/or weight-based adjustment of the mA/kV was utilized to reduce the radiation dose to as low as reasonably achievable. COMPARISON: CT abdomen and pelvis 12/12/2021. CLINICAL HISTORY: Abdominal pain, acute, nonlocalized. FINDINGS: LOWER CHEST: No acute  abnormality. LIVER: Mid 23 cm cyst in the left lobe of the liver. This is unchanged. GALLBLADDER AND BILE DUCTS: Gallbladder is unremarkable. No biliary ductal dilatation. SPLEEN: No acute abnormality. PANCREAS: No acute abnormality. ADRENAL GLANDS: No acute abnormality. KIDNEYS, URETERS AND BLADDER: There are several calculi in the mid left ureter. The 2 largest each measures 6 mm. There is moderate left hydronephrosis with left perinephric fat stranding. There are additional punctate bilateral renal calculi. There is a cyst in the left kidney measuring 2 cm. Per consensus, no follow-up is needed for simple Bosniak type 1 and 2 renal cysts, unless the patient has a malignancy history or risk factors. There is mild left perinephric fat stranding. There is a small amount of air in the bladder. There is some layering hyperdensity in the posterior left bladder. GI AND BOWEL: Stomach demonstrates no acute abnormality. There is a small hiatal hernia. There is diffuse colonic diverticulosis. The appendix appears normal. There is no bowel obstruction. PERITONEUM AND RETROPERITONEUM: No ascites. No free air. VASCULATURE: Aorta is normal in caliber. There are atherosclerotic calcifications of the aorta and iliac arteries. LYMPH NODES: No lymphadenopathy. REPRODUCTIVE ORGANS: Left adnexa mass measuring up to 3.5 cm appears unchanged, possibly  related to exophytic fibroid. The ovaries are unremarkable. BONES AND SOFT TISSUES: The bones are osteopenic. No acute osseous abnormality. No focal soft tissue abnormality. IMPRESSION: 1. Moderate left hydronephrosis with left perinephric fat stranding, secondary to several calculi in the mid left ureter, with two the 2 largest measuring 6 mm each. 2. Small amount of intravesical air and layering hyperdensity in the posterior left bladder, likely calculi . Correlate clinically for infection. 3. 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 Electronically signed by: Greig Pique MD 05/10/2024 09:56 PM EST RP Workstation: [id number]   CT Head Wo Contrast Result Date: 05/10/2024 EXAM: CT HEAD WITHOUT 05/10/2024 09:38:02 PM TECHNIQUE: CT of the head was performed without the administration of intravenous contrast. Automated exposure control, iterative reconstruction, and/or weight based adjustment of the mA/kV was utilized to reduce the radiation dose to as low as reasonably achievable. COMPARISON: None available. CLINICAL HISTORY: Mental status change, unknown cause. FINDINGS: BRAIN AND VENTRICLES: No acute intracranial hemorrhage. No mass effect or midline shift. No extra-axial fluid collection. No evidence of acute infarct. 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 Mild chronic microvascular ischemic change. ORBITS: No acute abnormality. SINUSES AND MASTOIDS: No acute abnormality. SOFT TISSUES AND SKULL: No acute skull fracture. No acute soft tissue abnormality. IMPRESSION: 1. No acute intracranial abnormality. 2. 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 3. Mild chronic microvascular ischemic changes. Electronically signed by: Oneil Devonshire MD 05/10/2024 09:54 PM EST RP Workstation: [id number]   DG Chest Portable 1 View Result Date: 05/10/2024 EXAM: 1 VIEW(S) XRAY OF THE CHEST 05/10/2024 06:58:00 PM  COMPARISON: Chest x-ray dated 12/12/2021. CLINICAL HISTORY: weakness FINDINGS: LUNGS AND PLEURA: 7 mm nodular density in left upper lobe. No pleural effusion. No pneumothorax. HEART AND MEDIASTINUM: Cardiomegaly. Tortuous thoracic aorta with atherosclerotic calcifications. BONES AND SOFT TISSUES: No acute osseous abnormality. IMPRESSION: 1. 7 mm nodular density in the left upper lobe. Recommend nonemergent chest CT for further characterization. 2. Cardiomegaly. Electronically signed by: Greig Pique MD 05/10/2024 07:11 PM EST RP Workstation: [id number]      Medications Ordered in the ED  lactated ringers  bolus 1,000 mL (1,000 mLs Intravenous New Bag/Given 05/10/24 1931)  iohexol  (OMNIPAQUE )  300 MG/ML solution 100 mL (80 mLs Intravenous Contrast Given 05/10/24 2101)  cefTRIAXone  (ROCEPHIN ) 2 g in sodium chloride  0.9 % 100 mL IVPB (2 g Intravenous New Bag/Given 05/10/24 2211)    Clinical Course as of 05/10/24 2301  Mon May 10, 2024  2028 Creatinine(!): 1.71 AKI compared to baseline [AY]  2211 Consulted Dr. Lovie with urology: He reviewed patient's imaging and advised patient would need a stent sooner than later.  He advised to have patient transferred to [facility] for admission and stent tomorrow morning.  Rocephin  will be given. [AY]    Clinical Course User Index [AY] Neysa Thersia RAMAN, PA-C                                Medical Decision Making Patient is a 78 year old female with a history of dementia, hypothyroidism, stage III CKD, and IBS who presents to the ED via EMS for increasing weakness and confusion for the past 2 days.  Notes she has been complaining of abdominal pain as well.  Please see detailed HPI above.  On exam patient is alert and in no acute distress.  Physical exam as noted above.  She is alert and oriented.  Differential includes viral illness, electrolyte abnormality, pneumonia, UTI, pyelonephritis, acute abdomen.  Lab workup is significant for AKI with a creatinine of  1.71 as well as leukocytosis of 18.1.  She is also noted to have a UTI.  There is acute concerns for pyelonephritis.  CT of the head reviewed that is negative for acute process.  Chest x-ray was unremarkable.  CT abdomen pelvis was obtained that does show several stones in the left mid ureter that are resulting in moderate hydronephrosis.  The 2 large stones are approximately 6 mm.  There is concern that patient is borderline septic secondary to obstructed stones in the setting of pyelonephritis.  Dr. Lovie with urology was consulted who did review patient's chart and imaging.  He advised that patient would need a stent sooner than later and this would best be performed at [facility] long first thing in the morning.  Patient has been started on Rocephin .  Hospitalist Dr. Adefeso at [facility] was consulted who is agreeable to admit patient to [facility] hospitalist for further evaluation and management.  Patient will be transferred acutely ED to ED due to acute concerns of decompensation in the setting of obstructed stone and pyelonephritis.  Dr. Lenor at [facility] emergency room was made aware of patient and is agreeable to accept patient this evening.  Patient is stable while in ED and awaiting transfer.  Amount and/or Complexity of Data Reviewed Labs: ordered. Decision-making details documented in ED Course. Radiology: ordered.  Risk Prescription drug management. Decision regarding hospitalization.       Final diagnoses:  Urinary tract infection with hematuria, site unspecified  AKI (acute kidney injury)  Ureterolithiasis  Pyelonephritis    ED Discharge Orders     None          Neysa Thersia RAMAN DEVONNA 05/10/24 2301    Francesca Elsie CROME, MD 05/10/24 2359

## 2024-05-10 NOTE — H&P (Signed)
 History and Physical    Patient: Amber [name] FMW:[id number] DOB: 09/20/1945 DOA: 05/10/2024 DOS: the patient was seen and examined on 05/10/2024 PCP: Tobie Suzzane POUR, MD  Patient coming from: {Point_of_Origin:26777}  Chief Complaint:  Chief Complaint  Patient presents with   Weakness   HPI: Amber [name] is a 78 y.o. female with medical history significant of ***  Review of Systems: {ROS_Text:26778} Past Medical History:  Diagnosis Date   ABLA (acute blood loss anemia) 06/19/2021   Anxiety    Aortic atherosclerosis    Aspiration pneumonia of right lung due to gastric secretions (HCC)    C. difficile colitis 07/13/2021   COVID-19 virus infection 06/08/2021    Depression    DVT of leg (deep venous thrombosis) (HCC) 05/2021   Dysphagia    Failure to thrive in adult    GERD (gastroesophageal reflux disease)    History of kidney stones    right ureter   Hypertension    Hyperthyroidism    Kidney disease    Pulmonary embolism (HCC) 05/2021   Stroke (HCC)    showed up in MRI   Thoracic aortic aneurysm    Urinary incontinence    Past Surgical History:  Procedure Laterality Date   BIOPSY  06/14/2022   Procedure: BIOPSY;  Surgeon: Eartha Angelia Sieving, MD;  Location: [facility];  Service: Gastroenterology;;   COLONOSCOPY WITH PROPOFOL  N/A 06/14/2022   Procedure: COLONOSCOPY WITH PROPOFOL ;  Surgeon: Eartha Angelia Sieving, MD;  Location: [facility];  Service: Gastroenterology;  Laterality: N/A;  11:00am, asa 3   CYSTOSCOPY W/ URETERAL STENT PLACEMENT Right 12/12/2021   Procedure: CYSTOSCOPY WITH RETROGRADE PYELOGRAM/URETERAL STENT PLACEMENT;  Surgeon: Penne Knee, MD;  Location: [facility] ORS;  Service: Urology;  Laterality: Right;   CYSTOSCOPY/URETEROSCOPY/HOLMIUM LASER/STENT PLACEMENT Right 01/14/2022   Procedure: CYSTOSCOPY/URETEROSCOPY/HOLMIUM LASER/STENT EXCHANGE;  Surgeon: Penne Knee, MD;  Location: [facility] ORS;  Service: Urology;  Laterality: Right;    ESOPHAGOGASTRODUODENOSCOPY (EGD) WITH PROPOFOL  N/A 06/14/2022   Procedure: ESOPHAGOGASTRODUODENOSCOPY (EGD) WITH PROPOFOL ;  Surgeon: Eartha Angelia Sieving, MD;  Location: [facility];  Service: Gastroenterology;  Laterality: N/A;   POLYPECTOMY  06/14/2022   Procedure: POLYPECTOMY;  Surgeon: Eartha Angelia Sieving, MD;  Location: [facility];  Service: Gastroenterology;;   Social History:  reports that she quit smoking about 5 years ago. Her smoking use included cigarettes. She has never used smokeless tobacco. She reports that she does not currently use alcohol. She reports that she does not currently use drugs.  Allergies[1]  Family History  Problem Relation Age of Onset   High blood pressure Maternal Grandmother     Prior to Admission medications  Medication Sig Start Date End Date Taking? Authorizing Provider  acetaminophen  (TYLENOL ) 500 MG tablet Take 500 mg by mouth every 6 (six) hours as needed for moderate pain or mild pain.    [provider]  alendronate  (FOSAMAX ) 70 MG tablet Take 1 tablet (70 mg total) by mouth every 7 (seven) days. Take with a full glass of water  on an empty stomach. 04/08/24   Nida, Gebreselassie W, MD  ARIPiprazole  (ABILIFY ) 5 MG tablet Take 5 mg by mouth at bedtime. 1 1/2 at bedtime 10/01/22   [provider]  escitalopram  (LEXAPRO ) 20 MG tablet Take 20 mg by mouth daily.    [provider]  ferrous sulfate  325 (65 FE) MG tablet Take 1 tablet (325 mg total) by mouth daily with breakfast. 07/17/21   Josette Ade, MD  methimazole  (TAPAZOLE ) 5  MG tablet Take 0.5 tablets (2.5 mg total) by mouth daily. 08/07/23   Nida, Gebreselassie W, MD  mirtazapine  (REMERON ) 15 MG tablet Take 1 tablet (15 mg total) by mouth at bedtime. 12/10/21   Eappen, Saramma, MD  Multiple Vitamin (MULTIVITAMIN WITH MINERALS) TABS tablet Take 1 tablet by mouth daily. 07/13/21   Josette Ade, MD  OVER THE COUNTER MEDICATION Stool softner as needed     [provider]  polyethylene glycol powder (GLYCOLAX /MIRALAX ) 17 GM/SCOOP powder Take 17 g by mouth as needed for mild constipation. 08/15/21   [provider]  valbenazine  (INGREZZA ) 40 MG capsule Take 40 mg by mouth daily. 12/15/23   [provider]    Physical Exam: Vitals:   05/10/24 1837 05/10/24 1838 05/10/24 1930 05/10/24 2045  BP:  125/88 134/89 (!) 145/85  Pulse:  85 87 77  Resp:  15 15 (!) 28  Temp:  98.4 F (36.9 C)    TempSrc:  Oral    SpO2:  97% 98% 97%  Weight: 84.1 kg     Height: 5' 6 (1.676 m)      *** Data Reviewed: {Tip this will not be part of the note when signed- Document your independent interpretation of telemetry tracing, EKG, lab, Radiology test or any other diagnostic tests. Add any new diagnostic test ordered today. (Optional):26781} {Results:26384}  Assessment and Plan: No notes have been filed under this hospital service. Service: Hospitalist     Advance Care Planning:   Code Status: Prior ***  Consults: ***  Family Communication: ***  Severity of Illness: {Observation/Inpatient:21159}  Author: Posey Maier, DO 05/10/2024 10:45 PM  For on call review [email or web address].     [1]  Allergies Allergen Reactions   Fish Allergy     Does not eat fish   Shellfish Allergy     I pass out   Valium [Diazepam] Other (See Comments)    Makes her want to  fly

## 2024-05-10 NOTE — ED Triage Notes (Signed)
 Pt bib [facility] for generalized weakness and poor po intake for a couple of days. Pt's husband usually takes care of her but he has been admitted to [facility]. Pt c/o rectal pain.

## 2024-05-11 ENCOUNTER — Inpatient Hospital Stay (HOSPITAL_COMMUNITY)

## 2024-05-11 ENCOUNTER — Inpatient Hospital Stay (HOSPITAL_COMMUNITY): Admitting: Certified Registered Nurse Anesthetist

## 2024-05-11 ENCOUNTER — Encounter (HOSPITAL_COMMUNITY): Payer: Self-pay | Admitting: Internal Medicine

## 2024-05-11 ENCOUNTER — Encounter (HOSPITAL_COMMUNITY): Admission: EM | Disposition: A | Payer: Self-pay | Source: Home / Self Care | Attending: Internal Medicine

## 2024-05-11 DIAGNOSIS — I10 Essential (primary) hypertension: Secondary | ICD-10-CM | POA: Diagnosis not present

## 2024-05-11 DIAGNOSIS — F418 Other specified anxiety disorders: Secondary | ICD-10-CM

## 2024-05-11 DIAGNOSIS — Z87891 Personal history of nicotine dependence: Secondary | ICD-10-CM

## 2024-05-11 DIAGNOSIS — N201 Calculus of ureter: Secondary | ICD-10-CM

## 2024-05-11 HISTORY — PX: CYSTOSCOPY W/ URETERAL STENT PLACEMENT: SHX1429

## 2024-05-11 LAB — COMPREHENSIVE METABOLIC PANEL WITH GFR
ALT: 13 U/L (ref 0–44)
AST: 18 U/L (ref 15–41)
Albumin: 2.8 g/dL — ABNORMAL LOW (ref 3.5–5.0)
Alkaline Phosphatase: 165 U/L — ABNORMAL HIGH (ref 38–126)
Anion gap: 11 (ref 5–15)
BUN: 36 mg/dL — ABNORMAL HIGH (ref 8–23)
CO2: 22 mmol/L (ref 22–32)
Calcium: 9.4 mg/dL (ref 8.9–10.3)
Chloride: 106 mmol/L (ref 98–111)
Creatinine, Ser: 1.63 mg/dL — ABNORMAL HIGH (ref 0.44–1.00)
GFR, Estimated: 32 mL/min — ABNORMAL LOW (ref >60)
Glucose, Bld: 87 mg/dL (ref 70–99)
Potassium: 3.5 mmol/L (ref 3.5–5.1)
Sodium: 138 mmol/L (ref 135–145)
Total Bilirubin: 0.5 mg/dL (ref 0.0–1.2)
Total Protein: 5.3 g/dL — ABNORMAL LOW (ref 6.5–8.1)

## 2024-05-11 LAB — CBC
HCT: 34.2 % — ABNORMAL LOW (ref 36.0–46.0)
Hemoglobin: 11 g/dL — ABNORMAL LOW (ref 12.0–15.0)
MCH: 28 pg (ref 26.0–34.0)
MCHC: 32.2 g/dL (ref 30.0–36.0)
MCV: 87 fL (ref 80.0–100.0)
Platelets: 129 K/uL — ABNORMAL LOW (ref 150–400)
RBC: 3.93 MIL/uL (ref 3.87–5.11)
RDW: 13.5 % (ref 11.5–15.5)
WBC: 16 K/uL — ABNORMAL HIGH (ref 4.0–10.5)
nRBC: 0 % (ref 0.0–0.2)

## 2024-05-11 LAB — TROPONIN T, HIGH SENSITIVITY
Troponin T High Sensitivity: 41 ng/L — ABNORMAL HIGH (ref 0–19)
Troponin T High Sensitivity: 41 ng/L — ABNORMAL HIGH (ref 0–19)

## 2024-05-11 LAB — PHOSPHORUS: Phosphorus: 1.7 mg/dL — ABNORMAL LOW (ref 2.5–4.6)

## 2024-05-11 LAB — MAGNESIUM: Magnesium: 2.1 mg/dL (ref 1.7–2.4)

## 2024-05-11 MED ORDER — ONDANSETRON HCL 4 MG/2ML IJ SOLN
INTRAMUSCULAR | Status: AC
  Filled 2024-05-11: qty 2

## 2024-05-11 MED ORDER — OXYCODONE HCL 5 MG/5ML PO SOLN: 5.0000 mg | Freq: Once | ORAL | Status: DC | PRN

## 2024-05-11 MED ORDER — WATER FOR IRRIGATION, STERILE IR SOLN
Status: DC | PRN
  Administered 2024-05-11: 13:00:00 3000 mL

## 2024-05-11 MED ORDER — IOHEXOL 300 MG/ML  SOLN
INTRAMUSCULAR | Status: DC | PRN
  Administered 2024-05-11: 13:00:00 10 mL

## 2024-05-11 MED ORDER — PROPOFOL 10 MG/ML IV BOLUS
INTRAVENOUS | Status: DC | PRN
  Administered 2024-05-11: 13:00:00 75 ug/kg/min via INTRAVENOUS
  Administered 2024-05-11: 13:00:00 30 mg via INTRAVENOUS

## 2024-05-11 MED ORDER — LACTATED RINGERS IV SOLN: INTRAVENOUS | Status: DC | PRN

## 2024-05-11 MED ORDER — ENSURE PLUS HIGH PROTEIN PO LIQD
237.0000 mL | Freq: Two times a day (BID) | ORAL | Status: DC
  Filled 2024-05-11 (×2): qty 237

## 2024-05-11 MED ORDER — POLYETHYLENE GLYCOL 3350 17 GM/SCOOP PO POWD: 17.0000 g | ORAL | Status: DC | PRN

## 2024-05-11 MED ORDER — SODIUM CHLORIDE 0.9 % IV SOLN
1.0000 g | INTRAVENOUS | Status: DC
  Administered 2024-05-11 – 2024-05-12 (×2): 1 g via INTRAVENOUS
  Filled 2024-05-11 (×3): qty 10

## 2024-05-11 MED ORDER — FERROUS SULFATE 325 (65 FE) MG PO TABS
325.0000 mg | ORAL_TABLET | Freq: Every day | ORAL | Status: DC
  Administered 2024-05-12 – 2024-05-13 (×2): 325 mg via ORAL
  Filled 2024-05-11 (×2): qty 1

## 2024-05-11 MED ORDER — CINACALCET HCL 30 MG PO TABS
30.0000 mg | ORAL_TABLET | Freq: Every day | ORAL | Status: DC
  Administered 2024-05-12 – 2024-05-13 (×2): 30 mg via ORAL
  Filled 2024-05-11 (×2): qty 1

## 2024-05-11 MED ORDER — OXYCODONE HCL 5 MG PO TABS: 5.0000 mg | ORAL_TABLET | Freq: Once | ORAL | Status: DC | PRN

## 2024-05-11 MED ORDER — ACETAMINOPHEN 325 MG PO TABS
650.0000 mg | ORAL_TABLET | Freq: Four times a day (QID) | ORAL | Status: DC | PRN
  Administered 2024-05-12 – 2024-05-13 (×3): 650 mg via ORAL
  Filled 2024-05-11 (×3): qty 2

## 2024-05-11 MED ORDER — VALBENAZINE TOSYLATE 40 MG PO CAPS
40.0000 mg | ORAL_CAPSULE | Freq: Every day | ORAL | Status: DC
  Administered 2024-05-11 – 2024-05-13 (×3): 40 mg via ORAL
  Filled 2024-05-11 (×3): qty 1

## 2024-05-11 MED ORDER — FENTANYL CITRATE (PF) 50 MCG/ML IJ SOSY: 12.5000 ug | PREFILLED_SYRINGE | INTRAMUSCULAR | Status: DC | PRN

## 2024-05-11 MED ORDER — PROPOFOL 10 MG/ML IV BOLUS
INTRAVENOUS | Status: AC
  Filled 2024-05-11: qty 20

## 2024-05-11 MED ORDER — ARIPIPRAZOLE 5 MG PO TABS
7.5000 mg | ORAL_TABLET | Freq: Every day | ORAL | Status: DC
  Administered 2024-05-11 – 2024-05-12 (×2): 7.5 mg via ORAL
  Filled 2024-05-11 (×3): qty 2

## 2024-05-11 MED ORDER — LACTATED RINGERS IV SOLN: INTRAVENOUS | Status: AC

## 2024-05-11 MED ORDER — LIDOCAINE HCL (PF) 2 % IJ SOLN
INTRAMUSCULAR | Status: AC
  Filled 2024-05-11: qty 5

## 2024-05-11 MED ORDER — ONDANSETRON HCL 4 MG PO TABS
4.0000 mg | ORAL_TABLET | Freq: Four times a day (QID) | ORAL | Status: DC | PRN
  Administered 2024-05-13: 10:00:00 4 mg via ORAL
  Filled 2024-05-11: qty 1

## 2024-05-11 MED ORDER — FENTANYL CITRATE (PF) 50 MCG/ML IJ SOSY: 25.0000 ug | PREFILLED_SYRINGE | INTRAMUSCULAR | Status: DC | PRN

## 2024-05-11 MED ORDER — PROPOFOL 1000 MG/100ML IV EMUL
INTRAVENOUS | Status: AC
  Filled 2024-05-11: qty 100

## 2024-05-11 MED ORDER — ADULT MULTIVITAMIN W/MINERALS CH
1.0000 | ORAL_TABLET | Freq: Every day | ORAL | Status: DC
  Administered 2024-05-11 – 2024-05-13 (×3): 1 via ORAL
  Filled 2024-05-11 (×3): qty 1

## 2024-05-11 MED ORDER — ONDANSETRON HCL 4 MG/2ML IJ SOLN
4.0000 mg | Freq: Four times a day (QID) | INTRAMUSCULAR | Status: DC | PRN
  Filled 2024-05-11: qty 2

## 2024-05-11 MED ORDER — LIDOCAINE HCL (PF) 2 % IJ SOLN
INTRAMUSCULAR | Status: DC | PRN
  Administered 2024-05-11: 13:00:00 40 mg via INTRADERMAL

## 2024-05-11 MED ORDER — ACETAMINOPHEN 650 MG RE SUPP
650.0000 mg | Freq: Four times a day (QID) | RECTAL | Status: DC | PRN
  Administered 2024-05-11: 10:00:00 650 mg via RECTAL
  Filled 2024-05-11: qty 1

## 2024-05-11 MED ORDER — MIRTAZAPINE 15 MG PO TABS
15.0000 mg | ORAL_TABLET | Freq: Every day | ORAL | Status: DC
  Administered 2024-05-11 – 2024-05-12 (×2): 15 mg via ORAL
  Filled 2024-05-11 (×2): qty 1

## 2024-05-11 MED ORDER — ESCITALOPRAM OXALATE 20 MG PO TABS
20.0000 mg | ORAL_TABLET | Freq: Every day | ORAL | Status: DC
  Administered 2024-05-11 – 2024-05-13 (×3): 20 mg via ORAL
  Filled 2024-05-11 (×3): qty 1

## 2024-05-11 MED ORDER — ONDANSETRON HCL 4 MG/2ML IJ SOLN
INTRAMUSCULAR | Status: DC | PRN
  Administered 2024-05-11: 13:00:00 4 mg via INTRAVENOUS

## 2024-05-11 MED ORDER — SODIUM PHOSPHATES 45 MMOLE/15ML IV SOLN
30.0000 mmol | Freq: Once | INTRAVENOUS | Status: AC
  Administered 2024-05-11: 20:00:00 30 mmol via INTRAVENOUS
  Filled 2024-05-11: qty 10

## 2024-05-11 MED ORDER — FENTANYL CITRATE (PF) 100 MCG/2ML IJ SOLN
INTRAMUSCULAR | Status: DC | PRN
  Administered 2024-05-11 (×3): 25 ug via INTRAVENOUS

## 2024-05-11 MED ORDER — DROPERIDOL 2.5 MG/ML IJ SOLN: 0.6250 mg | Freq: Once | INTRAMUSCULAR | Status: DC | PRN

## 2024-05-11 MED ORDER — FENTANYL CITRATE (PF) 100 MCG/2ML IJ SOLN
INTRAMUSCULAR | Status: AC
  Filled 2024-05-11: qty 2

## 2024-05-11 MED ORDER — METHIMAZOLE 2.5 MG HALF TABLET
2.5000 mg | ORAL_TABLET | Freq: Every day | ORAL | Status: DC
  Administered 2024-05-11 – 2024-05-13 (×3): 2.5 mg via ORAL
  Filled 2024-05-11 (×4): qty 1

## 2024-05-11 NOTE — Anesthesia Procedure Notes (Signed)
 Date/Time: 05/11/2024 1:15 PM  Performed by: Para Jerelene CROME, CRNAOxygen Delivery Method: Simple face mask

## 2024-05-11 NOTE — ED Notes (Signed)
 First poc with patient. PT axox4 at this time.  GCS 15. Family of patient at bedside.  No respiratory distress noted. Awaiting morning procedure

## 2024-05-11 NOTE — Transfer of Care (Addendum)
 Immediate Anesthesia Transfer of Care Note  Patient: Amber [name]  Procedure(s) Performed: CYSTOSCOPY, WITH RETROGRADE PYELOGRAM AND URETERAL STENT INSERTION (Left: Ureter)  Patient Location: PACU  Anesthesia Type:MAC  Level of Consciousness: drowsy and patient cooperative  Airway & Oxygen Therapy: Patient Spontanous Breathing and Patient connected to face mask oxygen  Post-op Assessment: Report given to RN and Post -op Vital signs reviewed and stable  Post vital signs: Reviewed and stable  Last Vitals:  Vitals Value Taken Time  BP 122/73 05/11/24 13:45  Temp    Pulse 80 05/11/24 13:47  Resp 20 05/11/24 13:47  SpO2 100 % 05/11/24 13:47  Vitals shown include unfiled device data.  Last Pain:  Vitals:   05/11/24 1054  TempSrc:   PainSc: 5          Complications: No notable events documented.

## 2024-05-11 NOTE — Consult Note (Signed)
 Urology Consult Note   Requesting Attending Physician:  Manfred Driver, DO Service Providing Consult: Urology  Consulting Attending: Dr. Carolee   Reason for Consult: Left side hydroureteronephrosis  HPI: Amber [name] is seen in consultation for reasons noted above at the request of Adefeso, Oladapo, DO. Patient is a 78 y.o. female presenting to [facility] emergency department via EMS for 2 to 3-day increasing weakness and confusion.  Patient has a remote history of ureteral stones addressed by Dr. Penne of [facility] urology [place].  CT A/P notes to mid 6 mm left ureteral stones.  Urinalysis suggestive of infection.  Developing fever-Tmax 100.7, WBC 18.1, SCR 1.71-baseline around 1.20.  Normal lactic acidosis.  On my arrival patient was alert, oriented, no distress.  She was accompanied by her daughter and another family member who is a designer, jewellery with [employer].  We reviewed the case and plan including natural history of stone production and available interventions.  All questions were answered to their satisfaction.  ------------------  Assessment:   78 y.o. female with multiple left side side mid ureteral stones with UTI, leukocytosis, and fever   Recommendations: # UTI # Left ureteral stones # AoCKD  CT A/P notes to roughly 6 mm mid left ureteral stones in the context of urinary tract infection and developing septic picture.  To the OR with Dr. Carolee for urgent ureteral stent placement.  Trend labs.  Agree with broad ABX.  Expect serum creatinine to be self-limiting with renal decompression.  Encourage fluids.  Urology will follow  Case and plan discussed with Dr. Carolee  Past Medical History: Past Medical History:  Diagnosis Date   ABLA (acute blood loss anemia) 06/19/2021   Anxiety    Aortic atherosclerosis    Aspiration pneumonia of right lung due to gastric secretions (HCC)    C. difficile colitis 07/13/2021   COVID-19 virus infection 06/08/2021     Depression    DVT of leg (deep venous thrombosis) (HCC) 05/2021   Dysphagia    Failure to thrive in adult    GERD (gastroesophageal reflux disease)    History of kidney stones    right ureter   Hypertension    Hyperthyroidism    Kidney disease    Pulmonary embolism (HCC) 05/2021   Stroke [facility])    showed up in MRI   Thoracic aortic aneurysm    Urinary incontinence     Past Surgical History:  Past Surgical History:  Procedure Laterality Date   BIOPSY  06/14/2022   Procedure: BIOPSY;  Surgeon: Eartha Angelia Sieving, MD;  [facility];  Service: Gastroenterology;;   COLONOSCOPY WITH PROPOFOL  N/A 06/14/2022   Procedure: COLONOSCOPY WITH PROPOFOL ;  Surgeon: Eartha Angelia Sieving, MD;  Location: [facility];  Service: Gastroenterology;  Laterality: N/A;  11:00am, asa 3   CYSTOSCOPY W/ URETERAL STENT PLACEMENT Right 12/12/2021   Procedure: CYSTOSCOPY WITH RETROGRADE PYELOGRAM/URETERAL STENT PLACEMENT;  Surgeon: Penne Knee, MD;  Location: [facility] ORS;  Service: Urology;  Laterality: Right;   CYSTOSCOPY/URETEROSCOPY/HOLMIUM LASER/STENT PLACEMENT Right 01/14/2022   Procedure: CYSTOSCOPY/URETEROSCOPY/HOLMIUM LASER/STENT EXCHANGE;  Surgeon: Penne Knee, MD;  Location: [facility] ORS;  Service: Urology;  Laterality: Right;   ESOPHAGOGASTRODUODENOSCOPY (EGD) WITH PROPOFOL  N/A 06/14/2022   Procedure: ESOPHAGOGASTRODUODENOSCOPY (EGD) WITH PROPOFOL ;  Surgeon: Eartha Angelia Sieving, MD;  Location: [facility];  Service: Gastroenterology;  Laterality: N/A;   POLYPECTOMY  06/14/2022   Procedure: POLYPECTOMY;  Surgeon: Eartha Angelia Sieving, MD;  Location: [facility];  Service: Gastroenterology;;  Medication: Current Facility-Administered Medications  Medication Dose Route Frequency Provider Last Rate Last Admin   acetaminophen  (TYLENOL ) tablet 650 mg  650 mg Oral Q6H PRN Adefeso, Oladapo, DO       Or   acetaminophen  (TYLENOL ) suppository 650 mg  650 mg Rectal Q6H PRN  Adefeso, Oladapo, DO       cefTRIAXone  (ROCEPHIN ) 1 g in sodium chloride  0.9 % 100 mL IVPB  1 g Intravenous Q24H Adefeso, Oladapo, DO       feeding supplement (ENSURE PLUS HIGH PROTEIN) liquid 237 mL  237 mL Oral BID BM Adefeso, Oladapo, DO       ondansetron  (ZOFRAN ) tablet 4 mg  4 mg Oral Q6H PRN Adefeso, Oladapo, DO       Or   ondansetron  (ZOFRAN ) injection 4 mg  4 mg Intravenous Q6H PRN Adefeso, Oladapo, DO       Current Outpatient Medications  Medication Sig Dispense Refill   acetaminophen  (TYLENOL ) 500 MG tablet Take 500 mg by mouth every 6 (six) hours as needed for moderate pain or mild pain.     alendronate  (FOSAMAX ) 70 MG tablet Take 1 tablet (70 mg total) by mouth every 7 (seven) days. Take with a full glass of water  on an empty stomach. 12 tablet 3   ARIPiprazole  (ABILIFY ) 5 MG tablet Take 5 mg by mouth at bedtime. 1 1/2 at bedtime     cinacalcet  (SENSIPAR ) 30 MG tablet Take 30 mg by mouth daily.     escitalopram  (LEXAPRO ) 20 MG tablet Take 20 mg by mouth daily.     ferrous sulfate  325 (65 FE) MG tablet Take 1 tablet (325 mg total) by mouth daily with breakfast. 30 tablet 0   methimazole  (TAPAZOLE ) 5 MG tablet Take 0.5 tablets (2.5 mg total) by mouth daily. 45 tablet 1   Multiple Vitamin (MULTIVITAMIN WITH MINERALS) TABS tablet Take 1 tablet by mouth daily. 30 tablet 0   OVER THE COUNTER MEDICATION Stool softner as needed     polyethylene glycol powder (GLYCOLAX /MIRALAX ) 17 GM/SCOOP powder Take 17 g by mouth as needed for mild constipation.     valbenazine  (INGREZZA ) 40 MG capsule Take 40 mg by mouth daily.     mirtazapine  (REMERON ) 15 MG tablet Take 1 tablet (15 mg total) by mouth at bedtime. 90 tablet 0    Allergies: Allergies[1]  Social History: Social History[2]  Family History Family History  Problem Relation Age of Onset   High blood pressure Maternal Grandmother     Review of Systems  Genitourinary:  Positive for flank pain. Negative for dysuria, frequency,  hematuria and urgency.     Objective   Vital signs in last 24 hours: BP (!) 146/80 (BP Location: Right Arm)   Pulse 80   Temp 100.2 F (37.9 C) (Oral)   Resp 18   Ht 5' 6 (1.676 m)   Wt 84.1 kg   SpO2 97%   BMI 29.93 kg/m   Physical Exam General: A&O, resting, appropriate HEENT: [place]/AT Pulmonary: Normal work of breathing Cardiovascular: no cyanosis    Most Recent Labs: Lab Results  Component Value Date   WBC 16.0 (H) 05/11/2024   HGB 11.0 (L) 05/11/2024   HCT 34.2 (L) 05/11/2024   PLT 129 (L) 05/11/2024    Lab Results  Component Value Date   NA 138 05/11/2024   K 3.5 05/11/2024   CL 106 05/11/2024   CO2 22 05/11/2024   BUN 36 (H) 05/11/2024   CREATININE 1.63 (H) 05/11/2024  CALCIUM 9.4 05/11/2024   MG 2.1 05/11/2024   PHOS 1.7 (L) 05/11/2024    Lab Results  Component Value Date   INR 1.1 06/08/2021   APTT <20 (L) 06/08/2021     Urine Culture: @[id number] (laburin,org,[id number])@   IMAGING: CT ABDOMEN PELVIS WO CONTRAST Result Date: 05/10/2024 EXAM: CT ABDOMEN AND PELVIS WITHOUT CONTRAST 05/10/2024 09:38:02 PM TECHNIQUE: CT of the abdomen and pelvis was performed without the administration of intravenous contrast. Multiplanar reformatted images are provided for review. Automated exposure control, iterative reconstruction, and/or weight-based adjustment of the mA/kV was utilized to reduce the radiation dose to as low as reasonably achievable. COMPARISON: CT abdomen and pelvis 12/12/2021. CLINICAL HISTORY: Abdominal pain, acute, nonlocalized. FINDINGS: LOWER CHEST: No acute abnormality. LIVER: Mid 23 cm cyst in the left lobe of the liver. This is unchanged. GALLBLADDER AND BILE DUCTS: Gallbladder is unremarkable. No biliary ductal dilatation. SPLEEN: No acute abnormality. PANCREAS: No acute abnormality. ADRENAL GLANDS: No acute abnormality. KIDNEYS, URETERS AND BLADDER: There are several calculi in the mid left ureter. The 2 largest each measures 6 mm. There  is moderate left hydronephrosis with left perinephric fat stranding. There are additional punctate bilateral renal calculi. There is a cyst in the left kidney measuring 2 cm. Per consensus, no follow-up is needed for simple Bosniak type 1 and 2 renal cysts, unless the patient has a malignancy history or risk factors. There is mild left perinephric fat stranding. There is a small amount of air in the bladder. There is some layering hyperdensity in the posterior left bladder. GI AND BOWEL: Stomach demonstrates no acute abnormality. There is a small hiatal hernia. There is diffuse colonic diverticulosis. The appendix appears normal. There is no bowel obstruction. PERITONEUM AND RETROPERITONEUM: No ascites. No free air. VASCULATURE: Aorta is normal in caliber. There are atherosclerotic calcifications of the aorta and iliac arteries. LYMPH NODES: No lymphadenopathy. REPRODUCTIVE ORGANS: Left adnexa mass measuring up to 3.5 cm appears unchanged, possibly related to exophytic fibroid. The ovaries are unremarkable. BONES AND SOFT TISSUES: The bones are osteopenic. No acute osseous abnormality. No focal soft tissue abnormality. IMPRESSION: 1. Moderate left hydronephrosis with left perinephric fat stranding, secondary to several calculi in the mid left ureter, with two the 2 largest measuring 6 mm each. 2. Small amount of intravesical air and layering hyperdensity in the posterior left bladder, likely calculi . Correlate clinically for infection. 3. 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 Electronically signed by: Greig Pique MD 05/10/2024 09:56 PM EST RP Workstation: [id number]   CT Head Wo Contrast Result Date: 05/10/2024 EXAM: CT HEAD WITHOUT 05/10/2024 09:38:02 PM TECHNIQUE: CT of the head was performed without the administration of intravenous contrast. Automated exposure control, iterative reconstruction, and/or weight based adjustment of the mA/kV was utilized to  reduce the radiation dose to as low as reasonably achievable. COMPARISON: None available. CLINICAL HISTORY: Mental status change, unknown cause. FINDINGS: BRAIN AND VENTRICLES: No acute intracranial hemorrhage. No mass effect or midline shift. No extra-axial fluid collection. No evidence of acute infarct. 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 Mild chronic microvascular ischemic change. ORBITS: No acute abnormality. SINUSES AND MASTOIDS: No acute abnormality. SOFT TISSUES AND SKULL: No acute skull fracture. No acute soft tissue abnormality. IMPRESSION: 1. No acute intracranial abnormality. 2. 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 3. Mild chronic microvascular ischemic changes. Electronically signed by: Oneil Devonshire MD 05/10/2024 09:54 PM EST  RP Workstation: [id number]   DG Chest Portable 1 View Result Date: 05/10/2024 EXAM: 1 VIEW(S) XRAY OF THE CHEST 05/10/2024 06:58:00 PM COMPARISON: Chest x-ray dated 12/12/2021. CLINICAL HISTORY: weakness FINDINGS: LUNGS AND PLEURA: 7 mm nodular density in left upper lobe. No pleural effusion. No pneumothorax. HEART AND MEDIASTINUM: Cardiomegaly. Tortuous thoracic aorta with atherosclerotic calcifications. BONES AND SOFT TISSUES: No acute osseous abnormality. IMPRESSION: 1. 7 mm nodular density in the left upper lobe. Recommend nonemergent chest CT for further characterization. 2. Cardiomegaly. Electronically signed by: Greig Pique MD 05/10/2024 07:11 PM EST RP Workstation: [id number]    ------  Ole Bourdon, NP Pager: [phone]   Please contact the urology consult pager with any further questions/concerns.     [1]  Allergies Allergen Reactions   Fish Allergy     Does not eat fish   Shellfish Allergy     I pass out   Valium [Diazepam] Other (See Comments)    Makes her want to  fly  [2]  Social History Tobacco Use   Smoking status: Former    Current packs/day:  0.00    Types: Cigarettes    Quit date: 05/27/2018    Years since quitting: 5.9   Smokeless tobacco: Never  Vaping Use   Vaping status: Never Used  Substance Use Topics   Alcohol use: Not Currently   Drug use: Not Currently

## 2024-05-11 NOTE — Interval H&P Note (Signed)
 History and Physical Interval Note:  05/11/2024 1:02 PM  Amber [name]  has presented today for surgery, with the diagnosis of LEFT URETERAL STONE.  The various methods of treatment have been discussed with the patient and family. After consideration of risks, benefits and other options for treatment, the patient has consented to  Procedures: CYSTOSCOPY, WITH RETROGRADE PYELOGRAM AND URETERAL STENT INSERTION (Left) as a surgical intervention.  The patient's history has been reviewed, patient examined, no change in status, stable for surgery.  I have reviewed the patient's chart and labs.  Questions were answered to the patient's satisfaction.     Sherwood [name], III

## 2024-05-11 NOTE — Discharge Instructions (Signed)

## 2024-05-11 NOTE — Hospital Course (Signed)
 Amber [name] is a 78 y.o. female with medical history significant of hypothyroidism, GERD, anxiety, depression, history of DVT, CKD 3A who presents to the emergency department via EMS from home due to 2 to 3-day onset of increasing weakness and confusion. Was not able to get out of bed. Found to be septic from UTI and Nephrolithiasis associated w/ Hydronephrosis and an AKI. Urology consulted and took her for 6 x 24 double-J ureteral stent. Now has a  Foley catheter  Assessment and Plan:  Sepsis 2/2 to UTI: Patient meets sepsis criteria due to being tachypneic and-leukocytosis with source of infection being the bladder. She was started on IV ceftriaxone  and this is being continued. WBC went from 18.1 -> 16.0. U/A showed a cloudy appearance with small hemoglobin, large leukocytes, negative nitrites, many bacteria, 11-20 RBCs per high-power field and greater than 50 WBCs with urine culture pending. C/w Pain Control w/ Acetaminophen  po/RC 650 mg q6hprn Mild Pain and IV Fentanyl  12.5 mcg q2hprn Severe Pain and po/IV Ondansetron  4 mg Nausea   Nephrolithiasis with associated Hydronephrosis: CT abdomen and pelvis showed several calculi in the mid left ureter, with 2 largest measuring 6 mm each. Moderate left hydronephrosis with left perinephric fat stranding, secondary to several calculi in the mid left ureter noted.  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  Will need outpatient follow-up for this   Acute Kidney Injury superimposed on CKD 3a: Baseline creatinine at 1.1-1.3. BUN/Cr went from 41/1.71 -> 36/1.63. S/p LR 1 Liter bolus. C/w Cinacalcet  30 mg po Daily. C/w IVF  Hydration with LR @ 75 mL/hr. Avoid Nephrotoxic Medications, Contrast Dyes, Hypotension and Dehydration to Ensure Adequate Renal Perfusion & will need to Renally Adjust Meds. CTM &Trend Renal Function carefully & repeat CMP in the AM   Hypophosphatemia: Phos Level was 1.7. Replete w/ IV K Phos  30 mmol. CTM & Replete as Necessary. Repeat CMP in the AM  Normocytic Anemia: Hgb/Hct went from 12.1/37.9 -> 11.0/34.2.  Resume home ferrous sulfate  325 mg daily. Check Anemia Panel in the AM. CTM for S/Sx of Bleeding; No overt bleeding noted. Repeat CBC in the AM  Thrombocytopenia: Plt Count went from 123 -> 129. CTM for S/Sx of Bleeding; No overt bleeding noted. Repeat CBC in the AM  Elevated Troponin Likely due to type II Demand Ischemia: Troponin 44 -> 40 -> 41 -> 41; Had some transient chest pain. Troponin flat and EKG appeared unchanged. CTM on Telemetry  Depression / Anxiety / Insomnia / TD: Continue with Escitalopram  20 mg p.o. daily, Aripiprazole  5 mg p.o. nightly, Valbenazine  40 mg p.o. daily, Mirtazapine  50 mg p.o. nightly  Hyperthyroidism: Check TSH in the AM; C/w Methimazole  2.5 mg p.o. daily  Hypoalbuminemia: Patient's Albumin Lvl went from 3.3 -> 2.8. CTM & Trend & repeat CMP in the AM  Overweight: Complicates overall prognosis and care. Estimated body mass index is 29.93 kg/m as calculated from the following:   Height as of this encounter: 5' 6 (1.676 m).   Weight as of this encounter: 84.1 kg. Weight Loss and Dietary Counseling given

## 2024-05-11 NOTE — Anesthesia Preprocedure Evaluation (Signed)
 Anesthesia Evaluation  Patient identified by MRN, date of birth, ID band Patient confused  General Assessment Comment:  Patient AO x 3, but trails off with wandering thought occasionally. Daughter at bedside.  Reviewed: Allergy & Precautions, NPO status , Patient's Chart, lab work & pertinent test results  History of Anesthesia Complications Negative for: history of anesthetic complications  Airway Mallampati: II  TM Distance: >3 FB Neck ROM: Full    Dental  (+) Edentulous Upper, Edentulous Lower   Pulmonary neg sleep apnea, neg COPD, Patient abstained from smoking.Not current smoker, former smoker   Pulmonary exam normal breath sounds clear to auscultation       Cardiovascular Exercise Tolerance: Good METShypertension, Pt. on medications (-) CAD and (-) Past MI (-) dysrhythmias  Rhythm:Regular Rate:Normal - Systolic murmurs    Neuro/Psych  PSYCHIATRIC DISORDERS Anxiety Depression    CVA    GI/Hepatic ,GERD  ,,(+)     (-) substance abuse  Denies Nausea or vomiting   Endo/Other  neg diabetes Hyperthyroidism   Renal/GU CRFRenal disease     Musculoskeletal   Abdominal   Peds  Hematology   Anesthesia Other Findings Past Medical History: 06/19/2021: ABLA (acute blood loss anemia) No date: Anxiety No date: Aortic atherosclerosis No date: Aspiration pneumonia of right lung due to gastric secretions  (HCC) 07/13/2021: C. difficile colitis No date: COVID-19 virus infection 06/08/2021 No date: Depression 05/2021: DVT of leg (deep venous thrombosis) (HCC) No date: Dysphagia No date: Failure to thrive in adult No date: GERD (gastroesophageal reflux disease) No date: History of kidney stones     Comment:  right ureter No date: Hypertension No date: Hyperthyroidism No date: Kidney disease 05/2021: Pulmonary embolism (HCC) No date: Stroke [facility])     Comment:  showed up in MRI No date: Thoracic aortic aneurysm No  date: Urinary incontinence  Reproductive/Obstetrics                              Anesthesia Physical Anesthesia Plan  ASA: 3  Anesthesia Plan: MAC   Post-op Pain Management: Minimal or no pain anticipated   Induction: Intravenous  PONV Risk Score and Plan: 3 and Propofol  infusion, TIVA, Ondansetron  and Treatment may vary due to age or medical condition  Airway Management Planned: Nasal Cannula  Additional Equipment: None  Intra-op Plan:   Post-operative Plan:   Informed Consent: I have reviewed the patients History and Physical, chart, labs and discussed the procedure including the risks, benefits and alternatives for the proposed anesthesia with the patient or authorized representative who has indicated his/her understanding and acceptance.     Dental advisory given  Plan Discussed with: CRNA and Surgeon  Anesthesia Plan Comments: (Discussed risks of anesthesia with patient and daughter at bedside, including possibility of difficulty with spontaneous ventilation under anesthesia necessitating airway intervention, PONV, and rare risks such as cardiac or respiratory or neurological events, and allergic reactions. Discussed the role of CRNA in patient's perioperative care. Patient understands.)        Anesthesia Quick Evaluation

## 2024-05-11 NOTE — Progress Notes (Signed)
 PROGRESS NOTE    Amber [name]  FMW:[id number] DOB: April 16, 1946 DOA: 05/10/2024 PCP: Tobie Suzzane POUR, MD   Brief Narrative:  Amber [name] is a 78 y.o. female with medical history significant of hypothyroidism, GERD, anxiety, depression, history of DVT, CKD 3A who presents to the emergency department via EMS from home due to 2 to 3-day onset of increasing weakness and confusion. Was not able to get out of bed. Found to be septic from UTI and Nephrolithiasis associated w/ Hydronephrosis and an AKI. Urology consulted and took her for 6 x 24 double-J ureteral stent. Now has a  Foley catheter  Assessment and Plan:  Sepsis 2/2 to UTI: Patient meets sepsis criteria due to being tachypneic and-leukocytosis with source of infection being the bladder. She was started on IV ceftriaxone  and this is being continued. WBC went from 18.1 -> 16.0. U/A showed a cloudy appearance with small hemoglobin, large leukocytes, negative nitrites, many bacteria, 11-20 RBCs per high-power field and greater than 50 WBCs with urine culture pending. C/w Pain Control w/ Acetaminophen  po/RC 650 mg q6hprn Mild Pain and IV Fentanyl  12.5 mcg q2hprn Severe Pain and po/IV Ondansetron  4 mg Nausea   Nephrolithiasis with associated Hydronephrosis: CT abdomen and pelvis showed several calculi in the mid left ureter, with 2 largest measuring 6 mm each. Moderate left hydronephrosis with left perinephric fat stranding, secondary to several calculi in the mid left ureter noted.  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  Will need outpatient follow-up for this   Acute Kidney Injury superimposed on CKD 3a:  Baseline creatinine at 1.1-1.3. BUN/Cr went from 41/1.71 -> 36/1.63. S/p LR 1 Liter bolus. C/w Cinacalcet  30 mg po Daily. C/w IVF Hydration with LR @ 75 mL/hr. Avoid Nephrotoxic Medications, Contrast Dyes, Hypotension and Dehydration to Ensure Adequate Renal Perfusion & will need to Renally Adjust Meds. CTM &Trend Renal Function carefully & repeat CMP in the AM   Hypophosphatemia: Phos Level was 1.7. Replete w/ IV K Phos  30 mmol. CTM & Replete as Necessary. Repeat CMP in the AM  Normocytic Anemia: Hgb/Hct went from 12.1/37.9 -> 11.0/34.2.  Resume home ferrous sulfate  325 mg daily. Check Anemia Panel in the AM. CTM for S/Sx of Bleeding; No overt bleeding noted. Repeat CBC in the AM  Thrombocytopenia: Plt Count went from 123 -> 129. CTM for S/Sx of Bleeding; No overt bleeding noted. Repeat CBC in the AM  Elevated Troponin Likely due to type II Demand Ischemia: Troponin 44 -> 40 -> 41 -> 41; Had some transient chest pain. Troponin flat and EKG appeared unchanged. CTM on Telemetry  Depression / Anxiety / Insomnia / TD: Continue with Escitalopram  20 mg p.o. daily, Aripiprazole  5 mg p.o. nightly, Valbenazine  40 mg p.o. daily, Mirtazapine  50 mg p.o. nightly  Hyperthyroidism: Check TSH in the AM; C/w Methimazole  2.5 mg p.o. daily  Hypoalbuminemia: Patient's Albumin Lvl went from 3.3 -> 2.8. CTM & Trend & repeat CMP in the AM  Overweight: Complicates overall prognosis and care. Estimated body mass index is 29.93 kg/m as calculated from the following:   Height as of this encounter: 5' 6 (1.676 m).   Weight as of this encounter:  84.1 kg. Weight Loss and Dietary Counseling given   DVT prophylaxis: SCDs Start: 05/11/24 0107    Code Status: Full Code Family Communication: D/w Daughter @ bedside   Disposition Plan:  Level of care: Med-Surg Status is: Inpatient Remains inpatient appropriate because: Needs further clinical improvement and clearance by the specialists   Consultants:  Urology    Procedures:  As delineated as above  Antimicrobials:  Anti-infectives (From admission, onward)    Start     Dose/Rate Route Frequency Ordered Stop   05/11/24 2200  cefTRIAXone  (ROCEPHIN ) 1 g in sodium chloride  0.9 % 100 mL IVPB        1 g 200 mL/hr over 30 Minutes Intravenous Every 24 hours 05/11/24 0128     05/10/24 2145  cefTRIAXone  (ROCEPHIN ) 2 g in sodium chloride  0.9 % 100 mL IVPB        2 g 200 mL/hr over 30 Minutes Intravenous  Once 05/10/24 2139 05/11/24 0013       Subjective: Seen and examined at bedside and was having some abdominal discomfort.  No nausea or vomiting.  Denied lightheadedness or dizziness.  Feels weak.  No other concerns or complaints this time.  Objective: Vitals:   05/11/24 1400 05/11/24 1415 05/11/24 1430 05/11/24 1505  BP: 132/77 133/74 (!) 140/76 139/76  Pulse: 79 81 76 85  Resp: (!) 21 (!) 22 19 (!) 22  Temp:    98.4 F (36.9 C)  TempSrc:    Oral  SpO2: 96% 93% 93% 100%  Weight:      Height:        Intake/Output Summary (Last 24 hours) at 05/11/2024 1813 Last data filed at 05/11/2024 1621 Gross per 24 hour  Intake 1673.73 ml  Output 1 ml  Net 1672.73 ml   Filed Weights   05/10/24 1837  Weight: 84.1 kg   Examination: Physical Exam:  Constitutional: WN/WD overweight elderly chronically ill appearing Caucasian female in NAD but appears anxious Respiratory: Diminished to auscultation bilaterally, no wheezing, rales, rhonchi or crackles. Normal respiratory effort and patient is not tachypenic. No accessory muscle use. Unlabored breathing  Cardiovascular: RRR, no murmurs / rubs / gallops. S1 and S2 auscultated. Mild edema Abdomen: Soft, Tender, Distended 2/2 body habitus. Bowel sounds positive.  GU: Deferred. Musculoskeletal: No clubbing / cyanosis of digits/nails. No joint deformity upper and lower extremities.  Skin: No rashes, lesions, ulcers on a limited skin evaluation. No induration; Warm and dry.  Neurologic: CN 2-12 grossly  intact with no focal deficits. Romberg sign and cerebellar reflexes not assessed.  Psychiatric: Normal judgment and insight. Alert and oriented x 3. A little anxious   Data Reviewed: I have personally reviewed following labs and imaging studies  CBC: Recent Labs  Lab 05/10/24 1923 05/11/24 0641  WBC 18.1* 16.0*  NEUTROABS 16.2*  --   HGB 12.1 11.0*  HCT 37.9 34.2*  MCV 87.3 87.0  PLT 123* 129*   Basic Metabolic Panel: Recent Labs  Lab 05/10/24 1923 05/11/24 0641  NA 138 138  K 4.0 3.5  CL 103 106  CO2 23 22  GLUCOSE 99 87  BUN 41* 36*  CREATININE 1.71* 1.63*  CALCIUM 9.9 9.4  MG  --  2.1  PHOS  --  1.7*   GFR: Estimated Creatinine Clearance: 32.1 mL/min (A) (by C-G formula based on SCr of 1.63 mg/dL (H)). Liver Function Tests: Recent Labs  Lab 05/10/24 1923 05/11/24 0641  AST 24 18  ALT 18 13  ALKPHOS 95 165*  BILITOT 0.9 0.5  PROT 6.0* 5.3*  ALBUMIN 3.3* 2.8*   Recent Labs  Lab 05/10/24 1923  LIPASE 13   No results for input(s): AMMONIA in the last 168 hours. Coagulation Profile: No results for input(s): INR, PROTIME in the last 168 hours. Cardiac Enzymes: No results for input(s): CKTOTAL, CKMB, CKMBINDEX, TROPONINI in the last 168 hours. BNP (last 3 results) No results for input(s): PROBNP in the last 8760 hours. HbA1C: No results for input(s): HGBA1C in the last 72 hours. CBG: No results for input(s): GLUCAP in the last 168 hours. Lipid Profile: No results for input(s): CHOL, HDL, LDLCALC, TRIG, CHOLHDL, LDLDIRECT in the last 72 hours. Thyroid  Function Tests: No results for input(s): TSH, T4TOTAL, FREET4, T3FREE, THYROIDAB in the last 72 hours. Anemia Panel: No results for input(s): VITAMINB12, FOLATE, FERRITIN, TIBC, IRON , RETICCTPCT in the last 72 hours. Sepsis Labs: Recent Labs  Lab 05/10/24 1923 05/10/24 2035  LATICACIDVEN 1.7 1.1    Recent Results (from the past 240 hours)  Resp  panel by RT-PCR (RSV, Flu A&B, Covid) Anterior Nasal Swab     Status: None   Collection Time: 05/10/24  7:30 PM   Specimen: Anterior Nasal Swab  Result Value Ref Range Status   SARS Coronavirus 2 by RT PCR NEGATIVE NEGATIVE Final    Comment: (NOTE) SARS-CoV-2 target nucleic acids are NOT DETECTED.  The SARS-CoV-2 RNA is generally detectable in upper respiratory specimens during the acute phase of infection. The lowest concentration of SARS-CoV-2 viral copies this assay can detect is 138 copies/mL. A negative result does not preclude SARS-Cov-2 infection and should not be used as the sole basis for treatment or other patient management decisions. A negative result may occur with  improper specimen collection/handling, submission of specimen other than nasopharyngeal swab, presence of viral mutation(s) within the areas targeted by this assay, and inadequate number of viral copies(<138 copies/mL). A negative result must be combined with clinical observations, patient history, and epidemiological information. The expected result is Negative.  Fact Sheet for Patients:  bloggercourse.com  Fact Sheet for Healthcare Providers:  seriousbroker.it  This test is no t yet approved or cleared by the [place]  FDA and  has been authorized for detection and/or diagnosis of SARS-CoV-2 by FDA under an Emergency Use Authorization (EUA). This EUA will remain  in effect (meaning this test can be used) for the duration of the COVID-19 declaration under Section 564(b)(1) of the Act, 21 U.S.C.section 360bbb-3(b)(1), unless the authorization is terminated  or revoked sooner.       Influenza A by PCR NEGATIVE NEGATIVE Final   Influenza B by PCR NEGATIVE NEGATIVE Final    Comment: (NOTE) The Xpert Xpress SARS-CoV-2/FLU/RSV plus assay is intended as an aid in the diagnosis of influenza from Nasopharyngeal swab specimens and should not be used as a  sole basis for treatment. Nasal washings and aspirates are unacceptable for Xpert Xpress SARS-CoV-2/FLU/RSV testing.  Fact Sheet for Patients: bloggercourse.com  Fact Sheet for Healthcare Providers: seriousbroker.it  This test is not yet approved or cleared by the [place]  FDA and has been authorized for detection and/or diagnosis of SARS-CoV-2 by FDA under an Emergency Use Authorization (EUA). This EUA will remain in effect (meaning this test can be used) for the duration of the COVID-19 declaration under Section 564(b)(1) of the Act, 21 U.S.C. section 360bbb-3(b)(1), unless the authorization is terminated or revoked.     Resp Syncytial Virus by PCR NEGATIVE NEGATIVE Final    Comment: (NOTE) Fact  Sheet for Patients: bloggercourse.com  Fact Sheet for Healthcare Providers: seriousbroker.it  This test is not yet approved or cleared by the [place]  FDA and has been authorized for detection and/or diagnosis of SARS-CoV-2 by FDA under an Emergency Use Authorization (EUA). This EUA will remain in effect (meaning this test can be used) for the duration of the COVID-19 declaration under Section 564(b)(1) of the Act, 21 U.S.C. section 360bbb-3(b)(1), unless the authorization is terminated or revoked.  Performed at [facility], [address], [place], [place] [address]     Radiology Studies: DG C-Arm 1-60 Min-No Report Result Date: 05/11/2024 Fluoroscopy was utilized by the requesting physician.  No radiographic interpretation.   CT ABDOMEN PELVIS WO CONTRAST Result Date: 05/10/2024 EXAM: CT ABDOMEN AND PELVIS WITHOUT CONTRAST 05/10/2024 09:38:02 PM TECHNIQUE: CT of the abdomen and pelvis was performed without the administration of intravenous contrast. Multiplanar reformatted images are provided for review. Automated exposure control, iterative reconstruction, and/or  weight-based adjustment of the mA/kV was utilized to reduce the radiation dose to as low as reasonably achievable. COMPARISON: CT abdomen and pelvis 12/12/2021. CLINICAL HISTORY: Abdominal pain, acute, nonlocalized. FINDINGS: LOWER CHEST: No acute abnormality. LIVER: Mid 23 cm cyst in the left lobe of the liver. This is unchanged. GALLBLADDER AND BILE DUCTS: Gallbladder is unremarkable. No biliary ductal dilatation. SPLEEN: No acute abnormality. PANCREAS: No acute abnormality. ADRENAL GLANDS: No acute abnormality. KIDNEYS, URETERS AND BLADDER: There are several calculi in the mid left ureter. The 2 largest each measures 6 mm. There is moderate left hydronephrosis with left perinephric fat stranding. There are additional punctate bilateral renal calculi. There is a cyst in the left kidney measuring 2 cm. Per consensus, no follow-up is needed for simple Bosniak type 1 and 2 renal cysts, unless the patient has a malignancy history or risk factors. There is mild left perinephric fat stranding. There is a small amount of air in the bladder. There is some layering hyperdensity in the posterior left bladder. GI AND BOWEL: Stomach demonstrates no acute abnormality. There is a small hiatal hernia. There is diffuse colonic diverticulosis. The appendix appears normal. There is no bowel obstruction. PERITONEUM AND RETROPERITONEUM: No ascites. No free air. VASCULATURE: Aorta is normal in caliber. There are atherosclerotic calcifications of the aorta and iliac arteries. LYMPH NODES: No lymphadenopathy. REPRODUCTIVE ORGANS: Left adnexa mass measuring up to 3.5 cm appears unchanged, possibly related to exophytic fibroid. The ovaries are unremarkable. BONES AND SOFT TISSUES: The bones are osteopenic. No acute osseous abnormality. No focal soft tissue abnormality. IMPRESSION: 1. Moderate left hydronephrosis with left perinephric fat stranding, secondary to several calculi in the mid left ureter, with two the 2 largest measuring 6 mm  each. 2. Small amount of intravesical air and layering hyperdensity in the posterior left bladder, likely calculi . Correlate clinically for infection. 3. 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 Electronically signed by: Greig Pique MD 05/10/2024 09:56 PM EST RP Workstation: [id number]   CT Head Wo Contrast Result Date: 05/10/2024 EXAM: CT HEAD WITHOUT 05/10/2024 09:38:02 PM TECHNIQUE: CT of the head was performed without the administration of intravenous contrast. Automated exposure control, iterative reconstruction, and/or weight based adjustment of the mA/kV was utilized to reduce the radiation dose to as low as reasonably achievable. COMPARISON: None available. CLINICAL HISTORY: Mental status change, unknown cause. FINDINGS: BRAIN AND VENTRICLES: No acute intracranial hemorrhage. No mass effect or midline shift. No extra-axial fluid collection. No evidence of acute infarct. 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 Mild chronic microvascular ischemic change. ORBITS: No acute abnormality. SINUSES AND MASTOIDS: No acute abnormality. SOFT TISSUES AND SKULL: No acute skull fracture. No acute soft tissue abnormality. IMPRESSION: 1. No acute intracranial abnormality. 2. 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 3. Mild chronic microvascular ischemic changes. Electronically signed by: Oneil Devonshire MD 05/10/2024 09:54 PM EST RP Workstation: [id number]   DG Chest Portable 1 View Result Date: 05/10/2024 EXAM: 1 VIEW(S) XRAY OF THE CHEST 05/10/2024 06:58:00 PM COMPARISON: Chest x-ray dated 12/12/2021. CLINICAL HISTORY: weakness FINDINGS: LUNGS AND PLEURA: 7 mm nodular density in left upper lobe. No pleural effusion. No pneumothorax. HEART AND MEDIASTINUM: Cardiomegaly. Tortuous thoracic aorta with atherosclerotic calcifications. BONES AND SOFT TISSUES: No acute osseous abnormality. IMPRESSION: 1. 7  mm nodular density in the left upper lobe. Recommend nonemergent chest CT for further characterization. 2. Cardiomegaly. Electronically signed by: Greig Pique MD 05/10/2024 07:11 PM EST RP Workstation: [id number]   Scheduled Meds:  ARIPiprazole   7.5 mg Oral QHS   cinacalcet   30 mg Oral Daily   escitalopram   20 mg Oral Daily   feeding supplement  237 mL Oral BID BM   [START ON 05/12/2024] ferrous sulfate   325 mg Oral Q breakfast   methimazole   2.5 mg Oral Daily   mirtazapine   15 mg Oral QHS   multivitamin with minerals  1 tablet Oral Daily   valbenazine   40 mg Oral Daily   Continuous Infusions:  cefTRIAXone  (ROCEPHIN )  IV     lactated ringers  75 mL/hr at 05/11/24 1621   sodium PHOSPHATE  IVPB (in mmol)      LOS: 1 day   Alejandro Marker, DO [facility] Available via Epic secure chat 7am-7pm After these hours, please refer to coverage provider listed on [email or web address] 05/11/2024, 6:13 PM

## 2024-05-11 NOTE — H&P (View-Only) (Signed)
 Urology Consult Note   Requesting Attending Physician:  Manfred Driver, DO Service Providing Consult: Urology  Consulting Attending: Dr. Carolee   Reason for Consult: Left side hydroureteronephrosis  HPI: Amber [name] is seen in consultation for reasons noted above at the request of Adefeso, Oladapo, DO. Patient is a 78 y.o. female presenting to [facility] emergency department via EMS for 2 to 3-day increasing weakness and confusion.  Patient has a remote history of ureteral stones addressed by Dr. Penne of [facility] urology [place].  CT A/P notes to mid 6 mm left ureteral stones.  Urinalysis suggestive of infection.  Developing fever-Tmax 100.7, WBC 18.1, SCR 1.71-baseline around 1.20.  Normal lactic acidosis.  On my arrival patient was alert, oriented, no distress.  She was accompanied by her daughter and another family member who is a designer, jewellery with [employer].  We reviewed the case and plan including natural history of stone production and available interventions.  All questions were answered to their satisfaction.  ------------------  Assessment:   78 y.o. female with multiple left side side mid ureteral stones with UTI, leukocytosis, and fever   Recommendations: # UTI # Left ureteral stones # AoCKD  CT A/P notes to roughly 6 mm mid left ureteral stones in the context of urinary tract infection and developing septic picture.  To the OR with Dr. Carolee for urgent ureteral stent placement.  Trend labs.  Agree with broad ABX.  Expect serum creatinine to be self-limiting with renal decompression.  Encourage fluids.  Urology will follow  Case and plan discussed with Dr. Carolee  Past Medical History: Past Medical History:  Diagnosis Date   ABLA (acute blood loss anemia) 06/19/2021   Anxiety    Aortic atherosclerosis    Aspiration pneumonia of right lung due to gastric secretions (HCC)    C. difficile colitis 07/13/2021   COVID-19 virus infection 06/08/2021     Depression    DVT of leg (deep venous thrombosis) (HCC) 05/2021   Dysphagia    Failure to thrive in adult    GERD (gastroesophageal reflux disease)    History of kidney stones    right ureter   Hypertension    Hyperthyroidism    Kidney disease    Pulmonary embolism (HCC) 05/2021   Stroke [facility])    showed up in MRI   Thoracic aortic aneurysm    Urinary incontinence     Past Surgical History:  Past Surgical History:  Procedure Laterality Date   BIOPSY  06/14/2022   Procedure: BIOPSY;  Surgeon: Eartha Angelia Sieving, MD;  [facility];  Service: Gastroenterology;;   COLONOSCOPY WITH PROPOFOL  N/A 06/14/2022   Procedure: COLONOSCOPY WITH PROPOFOL ;  Surgeon: Eartha Angelia Sieving, MD;  Location: [facility];  Service: Gastroenterology;  Laterality: N/A;  11:00am, asa 3   CYSTOSCOPY W/ URETERAL STENT PLACEMENT Right 12/12/2021   Procedure: CYSTOSCOPY WITH RETROGRADE PYELOGRAM/URETERAL STENT PLACEMENT;  Surgeon: Penne Knee, MD;  Location: [facility] ORS;  Service: Urology;  Laterality: Right;   CYSTOSCOPY/URETEROSCOPY/HOLMIUM LASER/STENT PLACEMENT Right 01/14/2022   Procedure: CYSTOSCOPY/URETEROSCOPY/HOLMIUM LASER/STENT EXCHANGE;  Surgeon: Penne Knee, MD;  Location: [facility] ORS;  Service: Urology;  Laterality: Right;   ESOPHAGOGASTRODUODENOSCOPY (EGD) WITH PROPOFOL  N/A 06/14/2022   Procedure: ESOPHAGOGASTRODUODENOSCOPY (EGD) WITH PROPOFOL ;  Surgeon: Eartha Angelia Sieving, MD;  Location: [facility];  Service: Gastroenterology;  Laterality: N/A;   POLYPECTOMY  06/14/2022   Procedure: POLYPECTOMY;  Surgeon: Eartha Angelia Sieving, MD;  Location: [facility];  Service: Gastroenterology;;  Medication: Current Facility-Administered Medications  Medication Dose Route Frequency Provider Last Rate Last Admin   acetaminophen  (TYLENOL ) tablet 650 mg  650 mg Oral Q6H PRN Adefeso, Oladapo, DO       Or   acetaminophen  (TYLENOL ) suppository 650 mg  650 mg Rectal Q6H PRN  Adefeso, Oladapo, DO       cefTRIAXone  (ROCEPHIN ) 1 g in sodium chloride  0.9 % 100 mL IVPB  1 g Intravenous Q24H Adefeso, Oladapo, DO       feeding supplement (ENSURE PLUS HIGH PROTEIN) liquid 237 mL  237 mL Oral BID BM Adefeso, Oladapo, DO       ondansetron  (ZOFRAN ) tablet 4 mg  4 mg Oral Q6H PRN Adefeso, Oladapo, DO       Or   ondansetron  (ZOFRAN ) injection 4 mg  4 mg Intravenous Q6H PRN Adefeso, Oladapo, DO       Current Outpatient Medications  Medication Sig Dispense Refill   acetaminophen  (TYLENOL ) 500 MG tablet Take 500 mg by mouth every 6 (six) hours as needed for moderate pain or mild pain.     alendronate  (FOSAMAX ) 70 MG tablet Take 1 tablet (70 mg total) by mouth every 7 (seven) days. Take with a full glass of water  on an empty stomach. 12 tablet 3   ARIPiprazole  (ABILIFY ) 5 MG tablet Take 5 mg by mouth at bedtime. 1 1/2 at bedtime     cinacalcet  (SENSIPAR ) 30 MG tablet Take 30 mg by mouth daily.     escitalopram  (LEXAPRO ) 20 MG tablet Take 20 mg by mouth daily.     ferrous sulfate  325 (65 FE) MG tablet Take 1 tablet (325 mg total) by mouth daily with breakfast. 30 tablet 0   methimazole  (TAPAZOLE ) 5 MG tablet Take 0.5 tablets (2.5 mg total) by mouth daily. 45 tablet 1   Multiple Vitamin (MULTIVITAMIN WITH MINERALS) TABS tablet Take 1 tablet by mouth daily. 30 tablet 0   OVER THE COUNTER MEDICATION Stool softner as needed     polyethylene glycol powder (GLYCOLAX /MIRALAX ) 17 GM/SCOOP powder Take 17 g by mouth as needed for mild constipation.     valbenazine  (INGREZZA ) 40 MG capsule Take 40 mg by mouth daily.     mirtazapine  (REMERON ) 15 MG tablet Take 1 tablet (15 mg total) by mouth at bedtime. 90 tablet 0    Allergies: Allergies[1]  Social History: Social History[2]  Family History Family History  Problem Relation Age of Onset   High blood pressure Maternal Grandmother     Review of Systems  Genitourinary:  Positive for flank pain. Negative for dysuria, frequency,  hematuria and urgency.     Objective   Vital signs in last 24 hours: BP (!) 146/80 (BP Location: Right Arm)   Pulse 80   Temp 100.2 F (37.9 C) (Oral)   Resp 18   Ht 5' 6 (1.676 m)   Wt 84.1 kg   SpO2 97%   BMI 29.93 kg/m   Physical Exam General: A&O, resting, appropriate HEENT: [place]/AT Pulmonary: Normal work of breathing Cardiovascular: no cyanosis    Most Recent Labs: Lab Results  Component Value Date   WBC 16.0 (H) 05/11/2024   HGB 11.0 (L) 05/11/2024   HCT 34.2 (L) 05/11/2024   PLT 129 (L) 05/11/2024    Lab Results  Component Value Date   NA 138 05/11/2024   K 3.5 05/11/2024   CL 106 05/11/2024   CO2 22 05/11/2024   BUN 36 (H) 05/11/2024   CREATININE 1.63 (H) 05/11/2024  CALCIUM 9.4 05/11/2024   MG 2.1 05/11/2024   PHOS 1.7 (L) 05/11/2024    Lab Results  Component Value Date   INR 1.1 06/08/2021   APTT <20 (L) 06/08/2021     Urine Culture: @[id number] (laburin,org,[id number],[id number])@   IMAGING: CT ABDOMEN PELVIS WO CONTRAST Result Date: 05/10/2024 EXAM: CT ABDOMEN AND PELVIS WITHOUT CONTRAST 05/10/2024 09:38:02 PM TECHNIQUE: CT of the abdomen and pelvis was performed without the administration of intravenous contrast. Multiplanar reformatted images are provided for review. Automated exposure control, iterative reconstruction, and/or weight-based adjustment of the mA/kV was utilized to reduce the radiation dose to as low as reasonably achievable. COMPARISON: CT abdomen and pelvis 12/12/2021. CLINICAL HISTORY: Abdominal pain, acute, nonlocalized. FINDINGS: LOWER CHEST: No acute abnormality. LIVER: Mid 23 cm cyst in the left lobe of the liver. This is unchanged. GALLBLADDER AND BILE DUCTS: Gallbladder is unremarkable. No biliary ductal dilatation. SPLEEN: No acute abnormality. PANCREAS: No acute abnormality. ADRENAL GLANDS: No acute abnormality. KIDNEYS, URETERS AND BLADDER: There are several calculi in the mid left ureter. The 2 largest each measures 6 mm. There  is moderate left hydronephrosis with left perinephric fat stranding. There are additional punctate bilateral renal calculi. There is a cyst in the left kidney measuring 2 cm. Per consensus, no follow-up is needed for simple Bosniak type 1 and 2 renal cysts, unless the patient has a malignancy history or risk factors. There is mild left perinephric fat stranding. There is a small amount of air in the bladder. There is some layering hyperdensity in the posterior left bladder. GI AND BOWEL: Stomach demonstrates no acute abnormality. There is a small hiatal hernia. There is diffuse colonic diverticulosis. The appendix appears normal. There is no bowel obstruction. PERITONEUM AND RETROPERITONEUM: No ascites. No free air. VASCULATURE: Aorta is normal in caliber. There are atherosclerotic calcifications of the aorta and iliac arteries. LYMPH NODES: No lymphadenopathy. REPRODUCTIVE ORGANS: Left adnexa mass measuring up to 3.5 cm appears unchanged, possibly related to exophytic fibroid. The ovaries are unremarkable. BONES AND SOFT TISSUES: The bones are osteopenic. No acute osseous abnormality. No focal soft tissue abnormality. IMPRESSION: 1. Moderate left hydronephrosis with left perinephric fat stranding, secondary to several calculi in the mid left ureter, with two the 2 largest measuring 6 mm each. 2. Small amount of intravesical air and layering hyperdensity in the posterior left bladder, likely calculi . Correlate clinically for infection. 3. 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 Electronically signed by: Greig Pique MD 05/10/2024 09:56 PM EST RP Workstation: [id number]   CT Head Wo Contrast Result Date: 05/10/2024 EXAM: CT HEAD WITHOUT 05/10/2024 09:38:02 PM TECHNIQUE: CT of the head was performed without the administration of intravenous contrast. Automated exposure control, iterative reconstruction, and/or weight based adjustment of the mA/kV was utilized to  reduce the radiation dose to as low as reasonably achievable. COMPARISON: None available. CLINICAL HISTORY: Mental status change, unknown cause. FINDINGS: BRAIN AND VENTRICLES: No acute intracranial hemorrhage. No mass effect or midline shift. No extra-axial fluid collection. No evidence of acute infarct. 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 Mild chronic microvascular ischemic change. ORBITS: No acute abnormality. SINUSES AND MASTOIDS: No acute abnormality. SOFT TISSUES AND SKULL: No acute skull fracture. No acute soft tissue abnormality. IMPRESSION: 1. No acute intracranial abnormality. 2. 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 3. Mild chronic microvascular ischemic changes. Electronically signed by: Oneil Devonshire MD 05/10/2024 09:54 PM EST  RP Workstation: [id number]   DG Chest Portable 1 View Result Date: 05/10/2024 EXAM: 1 VIEW(S) XRAY OF THE CHEST 05/10/2024 06:58:00 PM COMPARISON: Chest x-ray dated 12/12/2021. CLINICAL HISTORY: weakness FINDINGS: LUNGS AND PLEURA: 7 mm nodular density in left upper lobe. No pleural effusion. No pneumothorax. HEART AND MEDIASTINUM: Cardiomegaly. Tortuous thoracic aorta with atherosclerotic calcifications. BONES AND SOFT TISSUES: No acute osseous abnormality. IMPRESSION: 1. 7 mm nodular density in the left upper lobe. Recommend nonemergent chest CT for further characterization. 2. Cardiomegaly. Electronically signed by: Greig Pique MD 05/10/2024 07:11 PM EST RP Workstation: [id number]    ------  Ole Bourdon, NP Pager: [phone]   Please contact the urology consult pager with any further questions/concerns.     [1]  Allergies Allergen Reactions   Fish Allergy     Does not eat fish   Shellfish Allergy     I pass out   Valium [Diazepam] Other (See Comments)    Makes her want to  fly  [2]  Social History Tobacco Use   Smoking status: Former    Current packs/day:  0.00    Types: Cigarettes    Quit date: 05/27/2018    Years since quitting: 5.9   Smokeless tobacco: Never  Vaping Use   Vaping status: Never Used  Substance Use Topics   Alcohol use: Not Currently   Drug use: Not Currently

## 2024-05-11 NOTE — Op Note (Signed)
 Operative Note  Preoperative diagnosis:  1.  Left ureteral calculus with UTI  Post operative diagnosis: 1.  Left ureteral calculus with UTI  Procedure(s): 1.  Cystoscopy with left retrograde pyelogram and left ureteral stent placement  Surgeon: Sherwood Edison, MD  Assistants: None  Anesthesia: General  Complications: None immediate  EBL: Minimal  Specimens: 1.  None  Drains/Catheters: 1.  6 X 24 double-J ureteral stent 2.  Foley catheter  Intraoperative findings: 1.  Normal urethra and bladder 2.  Left retrograde pyelogram revealed a filling defect at the level of the stone with upstream hydroureteronephrosis  Indication: 78 year old female with a left ureteral calculus and UTI presents for urgent ureteral stent placement.  Description of procedure:  The patient was identified and consent was obtained.  The patient was taken to the operating room and placed in the supine position.  The patient was placed under general anesthesia.  Perioperative antibiotics were administered.  The patient was placed in dorsal lithotomy.  Patient was prepped and draped in a standard sterile fashion and a timeout was performed.  A 21 French rigid cystoscope was advanced into the urethra and into the bladder.  The left distal most portion of the ureter was cannulated with an open-ended ureteral catheter.  Retrograde pyelogram was performed with the findings noted above.  A sensor wire was then advanced up to the kidney under fluoroscopic guidance.  A 6 X 24 double-J ureteral stent was advanced up to the kidney under fluoroscopic guidance.  The wire was withdrawn and fluoroscopy confirmed good proximal placement and direct visualization confirmed a good coil within the bladder.  The bladder was drained and the scope withdrawn.  Foley catheter was placed.  This concluded the operation.  Patient tolerated procedure well and was stable postoperatively.  Plan: Continue IV antibiotics until culture  speciation.  She will need ureteroscopy in a couple weeks or so.

## 2024-05-11 NOTE — Anesthesia Postprocedure Evaluation (Signed)
 Anesthesia Post Note  Patient: Amber [name]  Procedure(s) Performed: CYSTOSCOPY, WITH RETROGRADE PYELOGRAM AND URETERAL STENT INSERTION (Left: Ureter)     Patient location during evaluation: PACU Anesthesia Type: MAC Level of consciousness: awake and alert Pain management: pain level controlled Vital Signs Assessment: post-procedure vital signs reviewed and stable Respiratory status: spontaneous breathing, nonlabored ventilation, respiratory function stable and patient connected to nasal cannula oxygen Cardiovascular status: stable and blood pressure returned to baseline Postop Assessment: no apparent nausea or vomiting Anesthetic complications: no   No notable events documented.  Last Vitals:  Vitals:   05/11/24 1415 05/11/24 1430  BP: 133/74 (!) 140/76  Pulse: 81 76  Resp: (!) 22 19  Temp:    SpO2: 93% 93%    Last Pain:  Vitals:   05/11/24 1430  TempSrc:   PainSc: 0-No pain                 Rome Ade

## 2024-05-12 ENCOUNTER — Encounter (HOSPITAL_COMMUNITY): Payer: Self-pay | Admitting: Urology

## 2024-05-12 DIAGNOSIS — A419 Sepsis, unspecified organism: Secondary | ICD-10-CM | POA: Diagnosis not present

## 2024-05-12 DIAGNOSIS — N390 Urinary tract infection, site not specified: Secondary | ICD-10-CM | POA: Diagnosis not present

## 2024-05-12 LAB — IRON AND TIBC
Iron: 13 ug/dL — ABNORMAL LOW (ref 28–170)
Saturation Ratios: 12 % (ref 10.4–31.8)
TIBC: 114 ug/dL — ABNORMAL LOW (ref 250–450)
UIBC: 101 ug/dL

## 2024-05-12 LAB — RETICULOCYTES
Immature Retic Fract: 9.4 % (ref 2.3–15.9)
RBC.: 3.66 MIL/uL — ABNORMAL LOW (ref 3.87–5.11)
Retic Count, Absolute: 14.5 K/uL — ABNORMAL LOW (ref 19.0–186.0)
Retic Ct Pct: 0.4 % (ref 0.4–3.1)

## 2024-05-12 LAB — CBC WITH DIFFERENTIAL/PLATELET
Abs Immature Granulocytes: 0.43 K/uL — ABNORMAL HIGH (ref 0.00–0.07)
Basophils Absolute: 0 K/uL (ref 0.0–0.1)
Basophils Relative: 0 %
Eosinophils Absolute: 0.2 K/uL (ref 0.0–0.5)
Eosinophils Relative: 1 %
HCT: 31.1 % — ABNORMAL LOW (ref 36.0–46.0)
Hemoglobin: 10 g/dL — ABNORMAL LOW (ref 12.0–15.0)
Immature Granulocytes: 4 %
Lymphocytes Relative: 10 %
Lymphs Abs: 1.2 K/uL (ref 0.7–4.0)
MCH: 27.7 pg (ref 26.0–34.0)
MCHC: 32.2 g/dL (ref 30.0–36.0)
MCV: 86.1 fL (ref 80.0–100.0)
Monocytes Absolute: 0.8 K/uL (ref 0.1–1.0)
Monocytes Relative: 7 %
Neutro Abs: 8.8 K/uL — ABNORMAL HIGH (ref 1.7–7.7)
Neutrophils Relative %: 78 %
Platelets: 125 K/uL — ABNORMAL LOW (ref 150–400)
RBC: 3.61 MIL/uL — ABNORMAL LOW (ref 3.87–5.11)
RDW: 13.6 % (ref 11.5–15.5)
WBC: 11.4 K/uL — ABNORMAL HIGH (ref 4.0–10.5)
nRBC: 0 % (ref 0.0–0.2)

## 2024-05-12 LAB — PHOSPHORUS: Phosphorus: 4.3 mg/dL (ref 2.5–4.6)

## 2024-05-12 LAB — COMPREHENSIVE METABOLIC PANEL WITH GFR
ALT: 12 U/L (ref 0–44)
AST: 12 U/L — ABNORMAL LOW (ref 15–41)
Albumin: 2.5 g/dL — ABNORMAL LOW (ref 3.5–5.0)
Alkaline Phosphatase: 88 U/L (ref 38–126)
Anion gap: 10 (ref 5–15)
BUN: 32 mg/dL — ABNORMAL HIGH (ref 8–23)
CO2: 23 mmol/L (ref 22–32)
Calcium: 8.9 mg/dL (ref 8.9–10.3)
Chloride: 108 mmol/L (ref 98–111)
Creatinine, Ser: 1.56 mg/dL — ABNORMAL HIGH (ref 0.44–1.00)
GFR, Estimated: 34 mL/min — ABNORMAL LOW (ref >60)
Glucose, Bld: 88 mg/dL (ref 70–99)
Potassium: 3.3 mmol/L — ABNORMAL LOW (ref 3.5–5.1)
Sodium: 141 mmol/L (ref 135–145)
Total Bilirubin: 0.3 mg/dL (ref 0.0–1.2)
Total Protein: 5 g/dL — ABNORMAL LOW (ref 6.5–8.1)

## 2024-05-12 LAB — MAGNESIUM: Magnesium: 1.9 mg/dL (ref 1.7–2.4)

## 2024-05-12 LAB — VITAMIN B12: Vitamin B-12: >4000 pg/mL — ABNORMAL HIGH (ref 180–914)

## 2024-05-12 LAB — FOLATE: Folate: 16.9 ng/mL (ref >5.9)

## 2024-05-12 LAB — FERRITIN: Ferritin: 543 ng/mL — ABNORMAL HIGH (ref 11–307)

## 2024-05-12 MED ORDER — CHLORHEXIDINE GLUCONATE CLOTH 2 % EX PADS
6.0000 | MEDICATED_PAD | Freq: Every day | CUTANEOUS | Status: DC
  Administered 2024-05-12: 18:00:00 6 via TOPICAL

## 2024-05-12 NOTE — Evaluation (Signed)
 Physical Therapy Evaluation Patient Details Name: Amber [name] MRN: [id number] DOB: 1945/09/28 Today's Date: 05/12/2024  History of Present Illness  78 yo female admitted with sepsis, uti. s/p ureteral stent placement 05/11/24. Hx of DVT, PE, CKD, anemia, thoratic aortic aneurysm, gait abnormality, depression/anxiety, incontinence  Clinical Impression  Limited bed level eval on today. Max encouragement (from therapist and pt's daughter) for patient participation. Pt only able to partially sit up at EOB on today. Pt stated I can't do it before abruptly returning to supine. Daughter present and reports that patient's husband is currently in hospital at [facility] and will likely not be to provide physical assistance. Daughter reports she is able to assist patient at home but pt will need to be able to walk short household distances. May need to consider short term rehab stay depending on continued progress. Will continue to follow and progress activity as pt is able to tolerate.       If plan is discharge home, recommend the following: A lot of help with walking and/or transfers;A lot of help with bathing/dressing/bathroom;Assistance with cooking/housework;Assist for transportation;Help with stairs or ramp for entrance   Can travel by private vehicle        Equipment Recommendations None recommended by PT  Recommendations for Other Services  OT consult    Functional Status Assessment Patient has had a recent decline in their functional status and demonstrates the ability to make significant improvements in function in a reasonable and predictable amount of time.     Precautions / Restrictions Precautions Precautions: Fall Restrictions Weight Bearing Restrictions Per Provider Order: No      Mobility  Bed Mobility Overal bed mobility: Needs Assistance Bed Mobility: Supine to Sit, Sit to Supine     Supine to sit: Mod assist, HOB elevated Sit to supine: Mod assist, HOB elevated    General bed mobility comments: Pt partially sat up at EOB before abruptly returning to supine. Max encouragement from therapist and patient's daughter for pariticipation. Assist for trunk and bil LEs.    Transfers                   General transfer comment: NT on today    Ambulation/Gait                  Stairs            Wheelchair Mobility     Tilt Bed    Modified Rankin (Stroke Patients Only)       Balance                                             Pertinent Vitals/Pain Pain Assessment Pain Assessment: Faces Faces Pain Scale: No hurt    Home Living Family/patient expects to be discharged to:: Private residence Living Arrangements: Spouse/significant other Available Help at Discharge: Family Type of Home: House Home Access: Stairs to enter   Secretary/administrator of Steps: 1   Home Layout: One level Home Equipment: Agricultural Consultant (2 wheels);BSC/3in1      Prior Function Prior Level of Function : Needs assist             Mobility Comments: uses RW for ambulation       Extremity/Trunk Assessment   Upper Extremity Assessment Upper Extremity Assessment: Defer to OT evaluation    Lower Extremity Assessment Lower Extremity Assessment: Generalized  weakness (unable to completely assess due to pt's limited effort)       Communication   Communication Communication: No apparent difficulties    Cognition Arousal: Lethargic Behavior During Therapy: Flat affect                           PT - Cognition Comments: daughter reports pt has some motivation issues at times Following commands: Impaired Following commands impaired: Follows one step commands with increased time     Cueing Cueing Techniques: Verbal cues, Gestural cues, Tactile cues     General Comments      Exercises General Exercises - Lower Extremity Ankle Circles/Pumps: AAROM, Both, 5 reps, Supine Quad Sets: AROM, 5 reps, Supine    Assessment/Plan    PT Assessment Patient needs continued PT services  PT Problem List Decreased strength;Decreased range of motion;Decreased activity tolerance;Decreased balance;Decreased mobility       PT Treatment Interventions DME instruction;Gait training;Patient/family education;Therapeutic activities;Therapeutic exercise;Functional mobility training;Balance training    PT Goals (Current goals can be found in the Care Plan section)  Acute Rehab PT Goals Patient Stated Goal: home PT Goal Formulation: With patient Time For Goal Achievement: 05/26/24 Potential to Achieve Goals: Fair    Frequency Min 3X/week     Co-evaluation               AM-PAC PT 6 Clicks Mobility  Outcome Measure Help needed turning from your back to your side while in a flat bed without using bedrails?: A Lot Help needed moving from lying on your back to sitting on the side of a flat bed without using bedrails?: A Lot Help needed moving to and from a bed to a chair (including a wheelchair)?: A Lot Help needed standing up from a chair using your arms (e.g., wheelchair or bedside chair)?: A Lot Help needed to walk in hospital room?: Total Help needed climbing 3-5 steps with a railing? : Total 6 Click Score: 10    End of Session   Activity Tolerance: Patient limited by fatigue Patient left: in bed;with call bell/phone within reach;with bed alarm set;with family/visitor present        Time: 1118-1140 PT Time Calculation (min) (ACUTE ONLY): 22 min   Charges:   PT Evaluation $PT Eval Low Complexity: 1 Low   PT General Charges $$ ACUTE PT VISIT: 1 Visit           Dannial SQUIBB, PT Acute Rehabilitation  Office: [phone]

## 2024-05-12 NOTE — TOC Initial Note (Signed)
 Transition of Care [facility]) - Initial/Assessment Note    Patient Details  Name: Amber [name] MRN: [id number] Date of Birth: [birth year]  Transition of Care [facility]) CM/SW Contact:    Bascom Service, RN Phone Number: 05/12/2024, 2:34 PM  Clinical Narrative: Spoke to [name](dtr) about d/c plans-dtr works for [employer] can get any dme needed. PT recc-no PT f/u. Await OT eval.Dtr aware of insurance not covering HHOT alone if no PT recc.Used [facility] in past.                  Expected Discharge Plan: Home/Self Care Barriers to Discharge: Continued Medical Work up   Patient Goals and CMS Choice Patient states their goals for this hospitalization and ongoing recovery are:: Home CMS Medicare.gov Compare Post Acute Care list provided to:: Patient Represenative (must comment) Choice offered to / list presented to : Adult Children  ownership interest in [facility].provided to:: Adult Children    Expected Discharge Plan and Services   Discharge Planning Services: CM Consult Post Acute Care Choice: Resumption of Svcs/PTA Provider Living arrangements for the past 2 months: Single Family Home                                      Prior Living Arrangements/Services Living arrangements for the past 2 months: Single Family Home Lives with:: Spouse   Do you feel safe going back to the place where you live?: Yes          Current home services: DME (rw)    Activities of Daily Living   ADL Screening (condition at time of admission) Independently performs ADLs?: No Does the patient have a NEW difficulty with bathing/dressing/toileting/self-feeding that is expected to last >3 days?: Yes (Initiates electronic notice to provider for possible OT consult) Does the patient have a NEW difficulty with getting in/out of bed, walking, or climbing stairs that is expected to last >3 days?: Yes (Initiates electronic notice to provider for possible PT  consult) Does the patient have a NEW difficulty with communication that is expected to last >3 days?: No Is the patient deaf or have difficulty hearing?: No Does the patient have difficulty seeing, even when wearing glasses/contacts?: No Does the patient have difficulty concentrating, remembering, or making decisions?: Yes  Permission Sought/Granted Permission sought to share information with : Case Manager Permission granted to share information with : Yes, Verbal Permission Granted              Emotional Assessment              Admission diagnosis:  Ureterolithiasis [N20.1] Pyelonephritis [N12] AKI (acute kidney injury) [N17.9] Sepsis secondary to UTI (HCC) [A41.9, N39.0] Urinary tract infection with hematuria, site unspecified [N39.0, R31.9] Patient Active Problem List   Diagnosis Date Noted   Osteoporosis without current pathological fracture 04/01/2024   Due for screening 02/12/2024   Encounter for osteoporosis screening in asymptomatic postmenopausal patient 02/12/2024   Cognitive impairment 05/08/2023   Memory loss 02/13/2023   Rectal pressure 11/25/2022   Disorder of bone density and structure, unspecified 10/22/2022   Hypercalcemia 08/08/2022   Chronic nausea 04/29/2022   IBS (irritable bowel syndrome) 04/29/2022   Refused influenza vaccine 03/25/2022   Hospital discharge follow-up 12/31/2021   Protein-calorie malnutrition, severe 12/14/2021   Hydronephrosis with obstructing calculus 12/12/2021   UTI (urinary tract infection) 12/12/2021   Hypokalemia 12/12/2021   At  risk for prolonged QT interval syndrome 12/10/2021   Chronic idiopathic constipation 11/21/2021   Multinodular goiter 11/07/2021   Thoracic aortic aneurysm 10/23/2021   MDD (major depressive disorder), recurrent, severe, with psychosis (HCC) 09/25/2021   High risk medication use 09/25/2021   Thyroid  nodule 09/18/2021   Pulmonary embolus (HCC) 08/16/2021   GAD (generalized anxiety disorder)  08/16/2021   Normocytic anemia 08/16/2021   Leg swelling 08/16/2021   Idiopathic peripheral neuropathy 08/16/2021   Sinus tachycardia 07/14/2021   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HCC) 06/19/2021   Chronic deep vein thrombosis (DVT) (HCC) 06/19/2021   Stage 3a chronic kidney disease (HCC) 06/19/2021   Gross hematuria 06/19/2021   Dysphagia    Other hyperparathyroidism 06/07/2021   Hyperthyroidism 04/25/2021   Gastroesophageal reflux disease without esophagitis 04/25/2021   Gait disturbance 03/29/2021   Urinary incontinence 03/29/2021   Moderate protein-calorie malnutrition 03/29/2021   Depression with anxiety 03/29/2021   Aortic atherosclerosis 03/29/2021   Adnexal mass 03/29/2021   PCP:  Tobie Suzzane POUR, MD Pharmacy:   [facility] - [place], [place] - [address] [place] [address] Phone: [phone] Fax: [phone]     Social Drivers of Health (SDOH) Social History: SDOH Screenings   Food Insecurity: No Food Insecurity (05/11/2024)  Housing: Low Risk (05/11/2024)  Transportation Needs: No Transportation Needs (05/11/2024)  Utilities: Not At Risk (05/11/2024)  Alcohol Screen: Low Risk (03/04/2023)  Depression (PHQ2-9): Medium Risk (02/12/2024)  Financial Resource Strain: Low Risk (08/04/2023)  Physical Activity: Unknown (08/04/2023)  Social Connections: Moderately Isolated (05/11/2024)  Stress: Stress Concern Present (08/04/2023)  Tobacco Use: Medium Risk (05/11/2024)  Health Literacy: Adequate Health Literacy (03/04/2023)   SDOH Interventions:     Readmission Risk Interventions     No data to display

## 2024-05-12 NOTE — Progress Notes (Signed)
 OT Cancellation Note  Patient Details Name: Amber [name] MRN: [id number] DOB: May 13, 1946   Cancelled Treatment:    Reason Eval/Treat Not Completed: Patient declined, stating she did not feel up for working with therapy today. OT and the pt's son encouraged the pt to attempt participation in the session, however she stated tomorrow would be a better day.    [name], OTR/L 05/12/2024, 5:02 PM

## 2024-05-12 NOTE — Progress Notes (Signed)
 1 Day Post-Op Subjective: Patient sleeping on rounds but was easily awoken.  No acute events overnight.  Pain is well-controlled.  Briefly reviewed case and plan.  Patient had no further questions.  Objective: Vital signs in last 24 hours: Temp:  [97.1 F (36.2 C)-100 F (37.8 C)] 97.9 F (36.6 C) (12/17 0538) Pulse Rate:  [73-89] 73 (12/17 0538) Resp:  [16-26] 18 (12/17 0538) BP: (117-140)/(63-80) 121/80 (12/17 0538) SpO2:  [93 %-100 %] 93 % (12/17 0538)  Assessment/Plan: # Ureteral stone # AoCKD # sepsis, urinary  To the OR with Dr. Carolee on 05/11/2024 for ureteral stent placement and treatment of two 6 mm left ureteral stones and developing septic picture. Patient looks much better today.  She is afebrile.  Leukocytosis is improving.  Hemodynamically stable Trend labs.  Small interval improvement in serum creatinine.  Encourage fluid intake Definitive stone management on an outpatient basis in a few weeks Remove Foley catheter  once afebrile for 24 hours.  Intake/Output from previous day: 12/16 0701 - 12/17 0700 In: 1556.2 [P.O.:240; I.V.:966.2; IV Piggyback:350] Out: 551 [Urine:550; Blood:1]  Intake/Output this shift: No intake/output data recorded.  Physical Exam:  General: Alert and oriented CV: No cyanosis Lungs: equal chest rise Gu: Foley in place draining clear yellow urine  Lab Results: Recent Labs    05/10/24 1923 05/11/24 0641 05/12/24 0531  HGB 12.1 11.0* 10.0*  HCT 37.9 34.2* 31.1*   BMET Recent Labs    05/11/24 0641 05/12/24 0531  NA 138 141  K 3.5 3.3*  CL 106 108  CO2 22 23  GLUCOSE 87 88  BUN 36* 32*  CREATININE 1.63* 1.56*  CALCIUM 9.4 8.9  HGB 11.0* 10.0*  WBC 16.0* 11.4*     Studies/Results: DG C-Arm 1-60 Min-No Report Result Date: 05/11/2024 Fluoroscopy was utilized by the requesting physician.  No radiographic interpretation.   CT ABDOMEN PELVIS WO CONTRAST Result Date: 05/10/2024 EXAM: CT ABDOMEN AND PELVIS WITHOUT  CONTRAST 05/10/2024 09:38:02 PM TECHNIQUE: CT of the abdomen and pelvis was performed without the administration of intravenous contrast. Multiplanar reformatted images are provided for review. Automated exposure control, iterative reconstruction, and/or weight-based adjustment of the mA/kV was utilized to reduce the radiation dose to as low as reasonably achievable. COMPARISON: CT abdomen and pelvis 12/12/2021. CLINICAL HISTORY: Abdominal pain, acute, nonlocalized. FINDINGS: LOWER CHEST: No acute abnormality. LIVER: Mid 23 cm cyst in the left lobe of the liver. This is unchanged. GALLBLADDER AND BILE DUCTS: Gallbladder is unremarkable. No biliary ductal dilatation. SPLEEN: No acute abnormality. PANCREAS: No acute abnormality. ADRENAL GLANDS: No acute abnormality. KIDNEYS, URETERS AND BLADDER: There are several calculi in the mid left ureter. The 2 largest each measures 6 mm. There is moderate left hydronephrosis with left perinephric fat stranding. There are additional punctate bilateral renal calculi. There is a cyst in the left kidney measuring 2 cm. Per consensus, no follow-up is needed for simple Bosniak type 1 and 2 renal cysts, unless the patient has a malignancy history or risk factors. There is mild left perinephric fat stranding. There is a small amount of air in the bladder. There is some layering hyperdensity in the posterior left bladder. GI AND BOWEL: Stomach demonstrates no acute abnormality. There is a small hiatal hernia. There is diffuse colonic diverticulosis. The appendix appears normal. There is no bowel obstruction. PERITONEUM AND RETROPERITONEUM: No ascites. No free air. VASCULATURE: Aorta is normal in caliber. There are atherosclerotic calcifications of the aorta and iliac arteries. LYMPH NODES: No lymphadenopathy.  REPRODUCTIVE ORGANS: Left adnexa mass measuring up to 3.5 cm appears unchanged, possibly related to exophytic fibroid. The ovaries are unremarkable. BONES AND SOFT TISSUES: The  bones are osteopenic. No acute osseous abnormality. No focal soft tissue abnormality. IMPRESSION: 1. Moderate left hydronephrosis with left perinephric fat stranding, secondary to several calculi in the mid left ureter, with two the 2 largest measuring 6 mm each. 2. Small amount of intravesical air and layering hyperdensity in the posterior left bladder, likely calculi . Correlate clinically for infection. 3. 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 Electronically signed by: Greig Pique MD 05/10/2024 09:56 PM EST RP Workstation: [id number]   CT Head Wo Contrast Result Date: 05/10/2024 EXAM: CT HEAD WITHOUT 05/10/2024 09:38:02 PM TECHNIQUE: CT of the head was performed without the administration of intravenous contrast. Automated exposure control, iterative reconstruction, and/or weight based adjustment of the mA/kV was utilized to reduce the radiation dose to as low as reasonably achievable. COMPARISON: None available. CLINICAL HISTORY: Mental status change, unknown cause. FINDINGS: BRAIN AND VENTRICLES: No acute intracranial hemorrhage. No mass effect or midline shift. No extra-axial fluid collection. No evidence of acute infarct. 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 Mild chronic microvascular ischemic change. ORBITS: No acute abnormality. SINUSES AND MASTOIDS: No acute abnormality. SOFT TISSUES AND SKULL: No acute skull fracture. No acute soft tissue abnormality. IMPRESSION: 1. No acute intracranial abnormality. 2. 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 3. Mild chronic microvascular ischemic changes. Electronically signed by: Oneil Devonshire MD 05/10/2024 09:54 PM EST RP Workstation: [id number]   DG Chest Portable 1 View Result Date: 05/10/2024 EXAM: 1 VIEW(S) XRAY OF THE CHEST 05/10/2024 06:58:00 PM COMPARISON: Chest x-ray dated 12/12/2021. CLINICAL HISTORY: weakness FINDINGS: LUNGS AND  PLEURA: 7 mm nodular density in left upper lobe. No pleural effusion. No pneumothorax. HEART AND MEDIASTINUM: Cardiomegaly. Tortuous thoracic aorta with atherosclerotic calcifications. BONES AND SOFT TISSUES: No acute osseous abnormality. IMPRESSION: 1. 7 mm nodular density in the left upper lobe. Recommend nonemergent chest CT for further characterization. 2. Cardiomegaly. Electronically signed by: Greig Pique MD 05/10/2024 07:11 PM EST RP Workstation: [id number]      LOS: 2 days   Ole Bourdon, NP [facility] Pager: [phone]  05/12/2024, 10:37 AM

## 2024-05-12 NOTE — Progress Notes (Addendum)
 PROGRESS NOTE    Amber [name]  FMW:[id number] DOB: 02/26/1946 DOA: 05/10/2024 PCP: Tobie Suzzane POUR, MD   Brief Narrative:   78 y.o. female with medical history significant of hypothyroidism, GERD, anxiety, depression, history of DVT, CKD 3A who presents to the emergency department via EMS from home due to 2 to 3-day onset of increasing weakness and confusion.  Found to be septic from UTI and Nephrolithiasis associated w/ Hydronephrosis and an AKI. Urology consulted and took her for 6 x 24 double-J ureteral stent. Now has a Foley catheter  Lives at home with her husband.  Assessment & Plan:  Active Problems:   * No active hospital problems. *    Sepsis 2/2 to acute complicated UTI secondary to Klebsiella: She was started on IV ceftriaxone  1 g Q24H and this is being continued. Status post 6 x 24 double-J ureteral stent and Foley catheter done on 12/16. F/u urine (klebsiella) and blood cultures     Nephrolithiasis with associated Hydronephrosis: CT abdomen and pelvis showed several calculi in the mid left ureter, with 2 largest measuring 6 mm each. Moderate left hydronephrosis with left perinephric fat stranding, secondary to several calculi in the mid left ureter noted.  There was also a small amount of intravesicular air and layering hyperdensity in the posterior left bladder likely calculi  Urology consulted . Status post 6 x 24 double-J ureteral stent and Foley catheter done on 12/16.   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  Will need outpatient follow-up for this   Acute Kidney Injury superimposed on CKD 3a: Baseline creatinine at 1.1-1.3.  Avoid Nephrotoxic Medications, Contrast Dyes, Hypotension and Dehydration to Ensure Adequate Renal Perfusion   Hypophosphatemia: prn repletion   Normocytic Anemia: stable   Thrombocytopenia: Stable   Elevated Troponin Likely due to type II Demand Ischemia: Troponin 44 -> 40 ->  41 -> 41 Troponin flat and EKG appeared unchanged. CTM on Telemetry   Major moderateDepression / Anxiety / Insomnia: Continue with Escitalopram  20 mg p.o. daily, Aripiprazole  5 mg p.o. nightly, Valbenazine  40 mg p.o. daily, Mirtazapine  50 mg p.o. nightly   Hyperthyroidism: C/w Methimazole  2.5 mg p.o. daily   Hypoalbuminemia: continue to monitor  Disposition: Home. Lives with her husband. Pending PT eval.  DVT prophylaxis: SCDs Start: 05/11/24 0107     Code Status: Full Code Family Communication:  None at the bedside Status is: Inpatient Remains inpatient appropriate because: Sepsis, UTI    Subjective:  No acute events overnight. She feels tired. She told me that she lives at home with her husband and can't walk at baseline.  Examination:  General exam: Appears calm and comfortable  Respiratory system: Clear to auscultation. Respiratory effort normal. Cardiovascular system: S1 & S2 heard, RRR. No JVD, murmurs, rubs, gallops or clicks. No pedal edema. Gastrointestinal system: Abdomen is nondistended, soft and nontender. No organomegaly or masses felt. Normal bowel sounds heard. Central nervous system: Alert and oriented. No focal neurological deficits. Extremities: Symmetric 5 x 5 power. Skin: No rashes, lesions or ulcers      Diet Orders (From admission, onward)     Start     Ordered   05/11/24 1504  Diet regular Fluid consistency: Thin  Diet effective now       Question:  Fluid consistency:  Answer:  Thin   05/11/24 1503            Objective: Vitals:   05/11/24 1505 05/11/24  1901 05/11/24 2321 05/12/24 0538  BP: 139/76 117/63 120/74 121/80  Pulse: 85 82 77 73  Resp: (!) 22 16 16 18   Temp: 98.4 F (36.9 C) 97.6 F (36.4 C) 99.5 F (37.5 C) 97.9 F (36.6 C)  TempSrc: Oral Oral Oral   SpO2: 100% 93% 94% 93%  Weight:      Height:        Intake/Output Summary (Last 24 hours) at 05/12/2024 1039 Last data filed at 05/12/2024 0612 Gross per 24 hour   Intake 1556.22 ml  Output 551 ml  Net 1005.22 ml   Filed Weights   05/10/24 1837  Weight: 84.1 kg    Scheduled Meds:  ARIPiprazole   7.5 mg Oral QHS   cinacalcet   30 mg Oral Q breakfast   escitalopram   20 mg Oral Daily   feeding supplement  237 mL Oral BID BM   ferrous sulfate   325 mg Oral Q breakfast   methIMAzole   2.5 mg Oral Daily   mirtazapine   15 mg Oral QHS   multivitamin with minerals  1 tablet Oral Daily   valbenazine   40 mg Oral Daily   Continuous Infusions:  cefTRIAXone  (ROCEPHIN )  IV Stopped (05/11/24 2238)    Nutritional status     Body mass index is 29.93 kg/m.  Data Reviewed:   CBC: Recent Labs  Lab 05/10/24 1923 05/11/24 0641 05/12/24 0531  WBC 18.1* 16.0* 11.4*  NEUTROABS 16.2*  --  8.8*  HGB 12.1 11.0* 10.0*  HCT 37.9 34.2* 31.1*  MCV 87.3 87.0 86.1  PLT 123* 129* 125*   Basic Metabolic Panel: Recent Labs  Lab 05/10/24 1923 05/11/24 0641 05/12/24 0531  NA 138 138 141  K 4.0 3.5 3.3*  CL 103 106 108  CO2 23 22 23   GLUCOSE 99 87 88  BUN 41* 36* 32*  CREATININE 1.71* 1.63* 1.56*  CALCIUM 9.9 9.4 8.9  MG  --  2.1 1.9  PHOS  --  1.7* 4.3   GFR: Estimated Creatinine Clearance: 33.5 mL/min (A) (by C-G formula based on SCr of 1.56 mg/dL (H)). Liver Function Tests: Recent Labs  Lab 05/10/24 1923 05/11/24 0641 05/12/24 0531  AST 24 18 12*  ALT 18 13 12   ALKPHOS 95 165* 88  BILITOT 0.9 0.5 0.3  PROT 6.0* 5.3* 5.0*  ALBUMIN 3.3* 2.8* 2.5*   Recent Labs  Lab 05/10/24 1923  LIPASE 13   No results for input(s): AMMONIA in the last 168 hours. Coagulation Profile: No results for input(s): INR, PROTIME in the last 168 hours. Cardiac Enzymes: No results for input(s): CKTOTAL, CKMB, CKMBINDEX, TROPONINI in the last 168 hours. BNP (last 3 results) No results for input(s): PROBNP in the last 8760 hours. HbA1C: No results for input(s): HGBA1C in the last 72 hours. CBG: No results for input(s): GLUCAP in the  last 168 hours. Lipid Profile: No results for input(s): CHOL, HDL, LDLCALC, TRIG, CHOLHDL, LDLDIRECT in the last 72 hours. Thyroid  Function Tests: No results for input(s): TSH, T4TOTAL, FREET4, T3FREE, THYROIDAB in the last 72 hours. Anemia Panel: Recent Labs    05/12/24 0531 05/12/24 0533  VITAMINB12 >4,000*  --   FOLATE 16.9  --   FERRITIN 543*  --   TIBC 114*  --   IRON  13*  --   RETICCTPCT  --  0.4   Sepsis Labs: Recent Labs  Lab 05/10/24 1923 05/10/24 2035  LATICACIDVEN 1.7 1.1    Recent Results (from the past 240 hours)  Resp panel by  RT-PCR (RSV, Flu A&B, Covid) Anterior Nasal Swab     Status: None   Collection Time: 05/10/24  7:30 PM   Specimen: Anterior Nasal Swab  Result Value Ref Range Status   SARS Coronavirus 2 by RT PCR NEGATIVE NEGATIVE Final    Comment: (NOTE) SARS-CoV-2 target nucleic acids are NOT DETECTED.  The SARS-CoV-2 RNA is generally detectable in upper respiratory specimens during the acute phase of infection. The lowest concentration of SARS-CoV-2 viral copies this assay can detect is 138 copies/mL. A negative result does not preclude SARS-Cov-2 infection and should not be used as the sole basis for treatment or other patient management decisions. A negative result may occur with  improper specimen collection/handling, submission of specimen other than nasopharyngeal swab, presence of viral mutation(s) within the areas targeted by this assay, and inadequate number of viral copies(<138 copies/mL). A negative result must be combined with clinical observations, patient history, and epidemiological information. The expected result is Negative.  Fact Sheet for Patients:  bloggercourse.com  Fact Sheet for Healthcare Providers:  seriousbroker.it  This test is no t yet approved or cleared by the [place]  FDA and  has been authorized for detection and/or diagnosis of  SARS-CoV-2 by FDA under an Emergency Use Authorization (EUA). This EUA will remain  in effect (meaning this test can be used) for the duration of the COVID-19 declaration under Section 564(b)(1) of the Act, 21 U.S.C.section 360bbb-3(b)(1), unless the authorization is terminated  or revoked sooner.       Influenza A by PCR NEGATIVE NEGATIVE Final   Influenza B by PCR NEGATIVE NEGATIVE Final    Comment: (NOTE) The Xpert Xpress SARS-CoV-2/FLU/RSV plus assay is intended as an aid in the diagnosis of influenza from Nasopharyngeal swab specimens and should not be used as a sole basis for treatment. Nasal washings and aspirates are unacceptable for Xpert Xpress SARS-CoV-2/FLU/RSV testing.  Fact Sheet for Patients: bloggercourse.com  Fact Sheet for Healthcare Providers: seriousbroker.it  This test is not yet approved or cleared by the [place]  FDA and has been authorized for detection and/or diagnosis of SARS-CoV-2 by FDA under an Emergency Use Authorization (EUA). This EUA will remain in effect (meaning this test can be used) for the duration of the COVID-19 declaration under Section 564(b)(1) of the Act, 21 U.S.C. section 360bbb-3(b)(1), unless the authorization is terminated or revoked.     Resp Syncytial Virus by PCR NEGATIVE NEGATIVE Final    Comment: (NOTE) Fact Sheet for Patients: bloggercourse.com  Fact Sheet for Healthcare Providers: seriousbroker.it  This test is not yet approved or cleared by the [place]  FDA and has been authorized for detection and/or diagnosis of SARS-CoV-2 by FDA under an Emergency Use Authorization (EUA). This EUA will remain in effect (meaning this test can be used) for the duration of the COVID-19 declaration under Section 564(b)(1) of the Act, 21 U.S.C. section 360bbb-3(b)(1), unless the authorization is terminated  or revoked.  Performed at [facility], [address], [place], [place] [address]   Urine Culture     Status: Abnormal (Preliminary result)   Collection Time: 05/10/24  8:00 PM   Specimen: Urine, Random  Result Value Ref Range Status   Specimen Description   Final    URINE, RANDOM Performed at [facility], [address], [place], [place] [address]    Special Requests   Final    NONE Reflexed from [id number] Performed at [facility], [address], [place], [place] [address]    Culture (A)  Final    >=100,000 COLONIES/mL GRAM NEGATIVE RODS SUSCEPTIBILITIES TO FOLLOW Performed at [facility], [address] [place], [place] [address]    Report Status PENDING  Incomplete         Radiology Studies: DG C-Arm 1-60 Min-No Report Result Date: 05/11/2024 Fluoroscopy was utilized by the requesting physician.  No radiographic interpretation.   CT ABDOMEN PELVIS WO CONTRAST Result Date: 05/10/2024 EXAM: CT ABDOMEN AND PELVIS WITHOUT CONTRAST 05/10/2024 09:38:02 PM TECHNIQUE: CT of the abdomen and pelvis was performed without the administration of intravenous contrast. Multiplanar reformatted images are provided for review. Automated exposure control, iterative reconstruction, and/or weight-based adjustment of the mA/kV was utilized to reduce the radiation dose to as low as reasonably achievable. COMPARISON: CT abdomen and pelvis 12/12/2021. CLINICAL HISTORY: Abdominal pain, acute, nonlocalized. FINDINGS: LOWER CHEST: No acute abnormality. LIVER: Mid 23 cm cyst in the left lobe of the liver. This is unchanged. GALLBLADDER AND BILE DUCTS: Gallbladder is unremarkable. No biliary ductal dilatation. SPLEEN: No acute abnormality. PANCREAS: No acute abnormality. ADRENAL GLANDS: No acute abnormality. KIDNEYS, URETERS AND BLADDER: There are several calculi in the mid left ureter. The 2 largest each measures 6 mm. There is moderate left hydronephrosis with left perinephric fat stranding. There are  additional punctate bilateral renal calculi. There is a cyst in the left kidney measuring 2 cm. Per consensus, no follow-up is needed for simple Bosniak type 1 and 2 renal cysts, unless the patient has a malignancy history or risk factors. There is mild left perinephric fat stranding. There is a small amount of air in the bladder. There is some layering hyperdensity in the posterior left bladder. GI AND BOWEL: Stomach demonstrates no acute abnormality. There is a small hiatal hernia. There is diffuse colonic diverticulosis. The appendix appears normal. There is no bowel obstruction. PERITONEUM AND RETROPERITONEUM: No ascites. No free air. VASCULATURE: Aorta is normal in caliber. There are atherosclerotic calcifications of the aorta and iliac arteries. LYMPH NODES: No lymphadenopathy. REPRODUCTIVE ORGANS: Left adnexa mass measuring up to 3.5 cm appears unchanged, possibly related to exophytic fibroid. The ovaries are unremarkable. BONES AND SOFT TISSUES: The bones are osteopenic. No acute osseous abnormality. No focal soft tissue abnormality. IMPRESSION: 1. Moderate left hydronephrosis with left perinephric fat stranding, secondary to several calculi in the mid left ureter, with two the 2 largest measuring 6 mm each. 2. Small amount of intravesical air and layering hyperdensity in the posterior left bladder, likely calculi . Correlate clinically for infection. 3. 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 Electronically signed by: Greig Pique MD 05/10/2024 09:56 PM EST RP Workstation: [id number]   CT Head Wo Contrast Result Date: 05/10/2024 EXAM: CT HEAD WITHOUT 05/10/2024 09:38:02 PM TECHNIQUE: CT of the head was performed without the administration of intravenous contrast. Automated exposure control, iterative reconstruction, and/or weight based adjustment of the mA/kV was utilized to reduce the radiation dose to as low as reasonably achievable. COMPARISON: None  available. CLINICAL HISTORY: Mental status change, unknown cause. FINDINGS: BRAIN AND VENTRICLES: No acute intracranial hemorrhage. No mass effect or midline shift. No extra-axial fluid collection. No evidence of acute infarct. 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 Mild chronic microvascular ischemic change. ORBITS: No acute abnormality. SINUSES AND MASTOIDS: No acute abnormality. SOFT TISSUES AND SKULL: No acute skull fracture. No acute soft tissue abnormality. IMPRESSION: 1. No acute intracranial abnormality. 2. 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 3. Mild chronic microvascular ischemic changes. Electronically signed by: Oneil Devonshire MD 05/10/2024 09:54 PM EST RP Workstation: [id number]   DG Chest Portable 1 View Result Date: 05/10/2024 EXAM: 1 VIEW(S) XRAY OF THE CHEST 05/10/2024 06:58:00 PM COMPARISON: Chest x-ray dated 12/12/2021. CLINICAL HISTORY: weakness FINDINGS: LUNGS AND PLEURA: 7 mm nodular density in left upper lobe. No pleural effusion. No pneumothorax. HEART AND MEDIASTINUM: Cardiomegaly. Tortuous thoracic aorta with atherosclerotic calcifications. BONES AND SOFT TISSUES: No acute osseous abnormality. IMPRESSION: 1. 7 mm nodular density in the left upper lobe. Recommend nonemergent chest CT for further characterization. 2. Cardiomegaly. Electronically signed by: Greig Pique MD 05/10/2024 07:11 PM EST RP Workstation: [id number]           LOS: 2 days   Time spent= 35 mins    Deliliah Room, MD [facility]  If 7PM-7AM, please contact night-coverage  05/12/2024, 10:39 AM

## 2024-05-13 DIAGNOSIS — A419 Sepsis, unspecified organism: Secondary | ICD-10-CM | POA: Diagnosis not present

## 2024-05-13 DIAGNOSIS — N390 Urinary tract infection, site not specified: Secondary | ICD-10-CM | POA: Diagnosis not present

## 2024-05-13 LAB — URINE CULTURE: Culture: >=100000 — AB

## 2024-05-13 MED ORDER — CIPROFLOXACIN HCL 500 MG PO TABS: 500.0000 mg | ORAL_TABLET | Freq: Two times a day (BID) | ORAL | 0 refills | Status: AC

## 2024-05-13 NOTE — Discharge Summary (Signed)
 Physician Discharge Summary   Patient: Amber [name] MRN: [id number] DOB: 1945-09-10  Admit date:     05/10/2024  Discharge date: 05/13/2024  Discharge Physician: Deliliah Room   PCP: Tobie Suzzane POUR, MD   Recommendations at discharge:    F/u with your PCP in 1 week Follow-up with urology in 2 weeks. Continue taking medications as prescribed   Discharge Diagnoses: Active Problems:   * No active hospital problems. *   Hospital Course:  78 y.o. female with medical history significant of hypothyroidism, GERD, anxiety, depression, history of DVT, CKD 3A who presents to the emergency department via EMS from home due to 2 to 3-day onset of increasing weakness and confusion.  Found to be septic from UTI and Nephrolithiasis associated w/ Hydronephrosis and an AKI. Urology consulted and took her for 6 x 24 double-J ureteral stent. Now has a Foley catheter  Lives at home with her husband.  Sepsis 2/2 to acute complicated UTI secondary to Klebsiella: Sepsis resolved  she was started on IV ceftriaxone  1 g Q24H and this was transition to oral ciprofloxacin .  Prescription given for a 4-day course Status post 6 x 24 double-J ureteral stent and Foley catheter done on 12/16. F/u urine (klebsiella) and blood cultures  Outpatient follow-up with urologist, Dr. Sherwood Edison in a couple of weeks.  Patient will need outpatient ureteroscopy.     Nephrolithiasis with associated Hydronephrosis: CT abdomen and pelvis showed several calculi in the mid left ureter, with 2 largest measuring 6 mm each. Moderate left hydronephrosis with left perinephric fat stranding, secondary to several calculi in the mid left ureter noted.  There was also a small amount of intravesicular air and layering hyperdensity in the posterior left bladder likely calculi  Urology consulted . Status post 6 x 24 double-J ureteral stent and Foley catheter done on 12/16. 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  Will need outpatient follow-up for this   Acute Kidney Injury superimposed on CKD 3a: Baseline creatinine at 1.1-1.3.  Avoid Nephrotoxic Medications   Hypophosphatemia: prn repletion   Normocytic Anemia: stable   Thrombocytopenia: Stable   Elevated Troponin Likely due to type II Demand Ischemia: Troponin 44 -> 40 -> 41 -> 41 Troponin flat and EKG appeared unchanged.  Asymptomatic, no chest pain   Major moderateDepression / Anxiety / Insomnia: Continue with Escitalopram  20 mg p.o. daily, Aripiprazole  5 mg p.o. nightly, Valbenazine  40 mg p.o. daily, Mirtazapine  50 mg p.o. nightly   Hyperthyroidism: C/w Methimazole  2.5 mg p.o. daily   Hypoalbuminemia: continue to monitor   Disposition: Home. Lives with her husband.  Home health services will be arranged  Case discussed with patient's daughter, [name] on the phone on the day of the discharge.      Consultants: Urology Procedures performed: Cystoscopy with left retrograde pyelogram and left ureteral stent placement Disposition: Home health Diet recommendation:  Regular diet DISCHARGE MEDICATION: Allergies as of 05/13/2024       Reactions   Fish Allergy    Does not eat fish   Shellfish Allergy    I pass out   Valium [diazepam] Other (See Comments)   Makes her want to  fly        Medication List     TAKE these medications    acetaminophen  500 MG tablet Commonly known as: TYLENOL  Take 500 mg by mouth every 6 (six)  hours as needed for moderate pain or mild pain.   alendronate  70 MG tablet Commonly known as: FOSAMAX  Take 1 tablet (70 mg total) by mouth every 7 (seven) days. Take with a full glass of water  on an empty stomach.   ARIPiprazole  5 MG tablet Commonly known as: ABILIFY  Take 5 mg by mouth at bedtime. 1 1/2 at bedtime   cinacalcet  30 MG tablet Commonly known as: SENSIPAR  Take 30 mg by mouth daily.   ciprofloxacin  500  MG tablet Commonly known as: Cipro  Take 1 tablet (500 mg total) by mouth 2 (two) times daily for 10 days.   escitalopram  20 MG tablet Commonly known as: LEXAPRO  Take 20 mg by mouth daily.   ferrous sulfate  325 (65 FE) MG tablet Take 1 tablet (325 mg total) by mouth daily with breakfast.   Ingrezza  40 MG capsule Generic drug: valbenazine  Take 40 mg by mouth daily.   methimazole  5 MG tablet Commonly known as: TAPAZOLE  Take 0.5 tablets (2.5 mg total) by mouth daily.   mirtazapine  15 MG tablet Commonly known as: REMERON  Take 1 tablet (15 mg total) by mouth at bedtime.   multivitamin with minerals Tabs tablet Take 1 tablet by mouth daily.   OVER THE COUNTER MEDICATION Stool softner as needed   polyethylene glycol powder 17 GM/SCOOP powder Commonly known as: GLYCOLAX /MIRALAX  Take 17 g by mouth as needed for mild constipation.        Contact information for follow-up providers     Tobie Suzzane POUR, MD. Schedule an appointment as soon as possible for a visit in 1 week(s).   Specialty: Internal Medicine Contact information: [address] [place] [address] [phone]         Carolee Sherwood JONETTA DOUGLAS, MD. Schedule an appointment as soon as possible for a visit in 2 week(s).   Specialty: Urology Contact information: [address] [place] [address] [phone]              Contact information for after-discharge care     Home Medical Care     [facility]) .   Service: Home Health Services Why: HHPT/OT/aide Contact information: [address] [place] [place]  [address] [phone]                    Discharge Exam: Fredricka Weights   05/10/24 1837  Weight: 84.1 kg   General exam: Appears calm and comfortable  Respiratory system: Clear to auscultation. Respiratory effort normal. Cardiovascular system: S1 & S2 heard, RRR. No JVD, murmurs, rubs, gallops or clicks. No pedal  edema. Gastrointestinal system: Abdomen is nondistended, soft and nontender. No organomegaly or masses felt. Normal bowel sounds heard. Central nervous system: Alert and oriented. No focal neurological deficits. Extremities: Symmetric 5 x 5 power. Skin: No rashes, lesions or ulcers  Condition at discharge: good  The results of significant diagnostics from this hospitalization (including imaging, microbiology, ancillary and laboratory) are listed below for reference.   Imaging Studies: DG C-Arm 1-60 Min-No Report Result Date: 05/11/2024 Fluoroscopy was utilized by the requesting physician.  No radiographic interpretation.   CT ABDOMEN PELVIS WO CONTRAST Result Date: 05/10/2024 EXAM: CT ABDOMEN AND PELVIS WITHOUT CONTRAST 05/10/2024 09:38:02 PM TECHNIQUE: CT of the abdomen and pelvis was performed without the administration of intravenous contrast. Multiplanar reformatted images are provided for review. Automated exposure control, iterative reconstruction, and/or weight-based adjustment of the mA/kV was utilized to reduce the radiation dose  to as low as reasonably achievable. COMPARISON: CT abdomen and pelvis 12/12/2021. CLINICAL HISTORY: Abdominal pain, acute, nonlocalized. FINDINGS: LOWER CHEST: No acute abnormality. LIVER: Mid 23 cm cyst in the left lobe of the liver. This is unchanged. GALLBLADDER AND BILE DUCTS: Gallbladder is unremarkable. No biliary ductal dilatation. SPLEEN: No acute abnormality. PANCREAS: No acute abnormality. ADRENAL GLANDS: No acute abnormality. KIDNEYS, URETERS AND BLADDER: There are several calculi in the mid left ureter. The 2 largest each measures 6 mm. There is moderate left hydronephrosis with left perinephric fat stranding. There are additional punctate bilateral renal calculi. There is a cyst in the left kidney measuring 2 cm. Per consensus, no follow-up is needed for simple Bosniak type 1 and 2 renal cysts, unless the patient has a malignancy history or risk  factors. There is mild left perinephric fat stranding. There is a small amount of air in the bladder. There is some layering hyperdensity in the posterior left bladder. GI AND BOWEL: Stomach demonstrates no acute abnormality. There is a small hiatal hernia. There is diffuse colonic diverticulosis. The appendix appears normal. There is no bowel obstruction. PERITONEUM AND RETROPERITONEUM: No ascites. No free air. VASCULATURE: Aorta is normal in caliber. There are atherosclerotic calcifications of the aorta and iliac arteries. LYMPH NODES: No lymphadenopathy. REPRODUCTIVE ORGANS: Left adnexa mass measuring up to 3.5 cm appears unchanged, possibly related to exophytic fibroid. The ovaries are unremarkable. BONES AND SOFT TISSUES: The bones are osteopenic. No acute osseous abnormality. No focal soft tissue abnormality. IMPRESSION: 1. Moderate left hydronephrosis with left perinephric fat stranding, secondary to several calculi in the mid left ureter, with two the 2 largest measuring 6 mm each. 2. Small amount of intravesical air and layering hyperdensity in the posterior left bladder, likely calculi . Correlate clinically for infection. 3. 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 Electronically signed by: Greig Pique MD 05/10/2024 09:56 PM EST RP Workstation: [id number]   CT Head Wo Contrast Result Date: 05/10/2024 EXAM: CT HEAD WITHOUT 05/10/2024 09:38:02 PM TECHNIQUE: CT of the head was performed without the administration of intravenous contrast. Automated exposure control, iterative reconstruction, and/or weight based adjustment of the mA/kV was utilized to reduce the radiation dose to as low as reasonably achievable. COMPARISON: None available. CLINICAL HISTORY: Mental status change, unknown cause. FINDINGS: BRAIN AND VENTRICLES: No acute intracranial hemorrhage. No mass effect or midline shift. No extra-axial fluid collection. No evidence of acute infarct. 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 Mild chronic microvascular ischemic change. ORBITS: No acute abnormality. SINUSES AND MASTOIDS: No acute abnormality. SOFT TISSUES AND SKULL: No acute skull fracture. No acute soft tissue abnormality. IMPRESSION: 1. No acute intracranial abnormality. 2. 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 3. Mild chronic microvascular ischemic changes. Electronically signed by: Oneil Devonshire MD 05/10/2024 09:54 PM EST RP Workstation: [id number]   DG Chest Portable 1 View Result Date: 05/10/2024 EXAM: 1 VIEW(S) XRAY OF THE CHEST 05/10/2024 06:58:00 PM COMPARISON: Chest x-ray dated 12/12/2021. CLINICAL HISTORY: weakness FINDINGS: LUNGS AND PLEURA: 7 mm nodular density in left upper lobe. No pleural effusion. No pneumothorax. HEART AND MEDIASTINUM: Cardiomegaly. Tortuous thoracic aorta with atherosclerotic calcifications. BONES AND SOFT TISSUES: No acute osseous abnormality. IMPRESSION: 1. 7 mm nodular density in the left upper lobe. Recommend nonemergent chest CT for further characterization. 2. Cardiomegaly. Electronically signed by: Greig Pique MD 05/10/2024 07:11 PM EST RP Workstation: [id number]  Microbiology: Results for orders placed or performed during the hospital encounter of 05/10/24  Resp panel by RT-PCR (RSV, Flu A&B, Covid) Anterior Nasal Swab     Status: None   Collection Time: 05/10/24  7:30 PM   Specimen: Anterior Nasal Swab  Result Value Ref Range Status   SARS Coronavirus 2 by RT PCR NEGATIVE NEGATIVE Final    Comment: (NOTE) SARS-CoV-2 target nucleic acids are NOT DETECTED.  The SARS-CoV-2 RNA is generally detectable in upper respiratory specimens during the acute phase of infection. The lowest concentration of SARS-CoV-2 viral copies this assay can detect is 138 copies/mL. A negative result does not preclude SARS-Cov-2 infection and should not be used as the sole basis for treatment  or other patient management decisions. A negative result may occur with  improper specimen collection/handling, submission of specimen other than nasopharyngeal swab, presence of viral mutation(s) within the areas targeted by this assay, and inadequate number of viral copies(<138 copies/mL). A negative result must be combined with clinical observations, patient history, and epidemiological information. The expected result is Negative.  Fact Sheet for Patients:  bloggercourse.com  Fact Sheet for Healthcare Providers:  seriousbroker.it  This test is no t yet approved or cleared by the [place]  FDA and  has been authorized for detection and/or diagnosis of SARS-CoV-2 by FDA under an Emergency Use Authorization (EUA). This EUA will remain  in effect (meaning this test can be used) for the duration of the COVID-19 declaration under Section 564(b)(1) of the Act, 21 U.S.C.section 360bbb-3(b)(1), unless the authorization is terminated  or revoked sooner.       Influenza A by PCR NEGATIVE NEGATIVE Final   Influenza B by PCR NEGATIVE NEGATIVE Final    Comment: (NOTE) The Xpert Xpress SARS-CoV-2/FLU/RSV plus assay is intended as an aid in the diagnosis of influenza from Nasopharyngeal swab specimens and should not be used as a sole basis for treatment. Nasal washings and aspirates are unacceptable for Xpert Xpress SARS-CoV-2/FLU/RSV testing.  Fact Sheet for Patients: bloggercourse.com  Fact Sheet for Healthcare Providers: seriousbroker.it  This test is not yet approved or cleared by the [place]  FDA and has been authorized for detection and/or diagnosis of SARS-CoV-2 by FDA under an Emergency Use Authorization (EUA). This EUA will remain in effect (meaning this test can be used) for the duration of the COVID-19 declaration under Section 564(b)(1) of the Act, 21 U.S.C. section  360bbb-3(b)(1), unless the authorization is terminated or revoked.     Resp Syncytial Virus by PCR NEGATIVE NEGATIVE Final    Comment: (NOTE) Fact Sheet for Patients: bloggercourse.com  Fact Sheet for Healthcare Providers: seriousbroker.it  This test is not yet approved or cleared by the [place]  FDA and has been authorized for detection and/or diagnosis of SARS-CoV-2 by FDA under an Emergency Use Authorization (EUA). This EUA will remain in effect (meaning this test can be used) for the duration of the COVID-19 declaration under Section 564(b)(1) of the Act, 21 U.S.C. section 360bbb-3(b)(1), unless the authorization is terminated or revoked.  Performed at [facility], [address] [place], [place] [address]   Urine Culture     Status: Abnormal   Collection Time: 05/10/24  8:00 PM   Specimen: Urine, Random  Result Value Ref Range Status   Specimen Description   Final    URINE, RANDOM Performed at [facility], [address] [place], [place] [address]    Special Requests   Final    NONE Reflexed from [id number] Performed at  [facility], [address] [place], [place] [address]    Culture >=100,000 COLONIES/mL KLEBSIELLA AEROGENES (A)  Final   Report Status 05/13/2024 FINAL  Final   Organism ID, Bacteria KLEBSIELLA AEROGENES (A)  Final      Susceptibility   Klebsiella aerogenes - MIC*    ERTAPENEM <=0.12 SENSITIVE Sensitive     CEFTRIAXONE  <=0.25 SENSITIVE Sensitive     CIPROFLOXACIN  <=0.06 SENSITIVE Sensitive     GENTAMICIN <=1 SENSITIVE Sensitive     NITROFURANTOIN 128 RESISTANT Resistant     TRIMETH /SULFA  <=20 SENSITIVE Sensitive     MEROPENEM <=0.25 SENSITIVE Sensitive     * >=100,000 COLONIES/mL KLEBSIELLA AEROGENES    Labs: CBC: Recent Labs  Lab 05/10/24 1923 05/11/24 0641 05/12/24 0531  WBC 18.1* 16.0* 11.4*  NEUTROABS 16.2*  --  8.8*  HGB 12.1 11.0* 10.0*  HCT 37.9 34.2* 31.1*  MCV 87.3 87.0 86.1   PLT 123* 129* 125*   Basic Metabolic Panel: Recent Labs  Lab 05/10/24 1923 05/11/24 0641 05/12/24 0531  NA 138 138 141  K 4.0 3.5 3.3*  CL 103 106 108  CO2 23 22 23   GLUCOSE 99 87 88  BUN 41* 36* 32*  CREATININE 1.71* 1.63* 1.56*  CALCIUM 9.9 9.4 8.9  MG  --  2.1 1.9  PHOS  --  1.7* 4.3   Liver Function Tests: Recent Labs  Lab 05/10/24 1923 05/11/24 0641 05/12/24 0531  AST 24 18 12*  ALT 18 13 12   ALKPHOS 95 165* 88  BILITOT 0.9 0.5 0.3  PROT 6.0* 5.3* 5.0*  ALBUMIN 3.3* 2.8* 2.5*   CBG: No results for input(s): GLUCAP in the last 168 hours.  Discharge time spent: 40 minutes.  Signed: Deliliah Room, MD [facility] 05/13/2024

## 2024-05-13 NOTE — Evaluation (Signed)
 Occupational Therapy Evaluation Patient Details Name: Amber [name] MRN: [id number] DOB: December 26, 1945 Today's Date: 05/13/2024   History of Present Illness   78 yo female admitted with sepsis, uti. s/p ureteral stent placement 05/11/24. Hx of DVT, PE, CKD, anemia, thoratic aortic aneurysm, gait abnormality, depression/anxiety, incontinence     Clinical Impressions PTA, patient lives at home with family and has daughter present bedside who assists for bathing at baseline.  Currently, patient presents with deficits outlined below (see OT Problem List for details) most significantly decreased  muscle strength, balance, activity tolerance and cognition limiting ADL's and functional mobility performance. Patient will benefit from caregiver assist and support with [facility] services upon discharge to home setting post acute.  Patient requires continued Acute care hospital level OT services to progress safety and functional performance and allow for discharge.       If plan is discharge home, recommend the following:   A lot of help with walking and/or transfers;A lot of help with bathing/dressing/bathroom;Assistance with feeding;Assistance with cooking/housework;Direct supervision/assist for medications management;Direct supervision/assist for financial management;Assist for transportation;Help with stairs or ramp for entrance;Supervision due to cognitive status     Functional Status Assessment   Patient has had a recent decline in their functional status and demonstrates the ability to make significant improvements in function in a reasonable and predictable amount of time.     Equipment Recommendations   None recommended by OT      Precautions/Restrictions   Precautions Precautions: Fall Restrictions Weight Bearing Restrictions Per Provider Order: No     Mobility Bed Mobility Overal bed mobility: Needs Assistance Bed Mobility: Supine to Sit, Sit to Supine     Supine to sit:  Mod assist, HOB elevated     General bed mobility comments: min cues    Transfers Overall transfer level: Needs assistance Equipment used: Rolling walker (2 wheels) Transfers: Sit to/from Stand, Bed to chair/wheelchair/BSC Sit to Stand: Mod assist     Step pivot transfers: Mod assist     General transfer comment: reliance on RA, forward trunk posture, cues for hand and foot placement      Balance Overall balance assessment: Needs assistance Sitting-balance support: Feet supported Sitting balance-Leahy Scale: Fair   Postural control: Other (comment) (forward lean) Standing balance support: Bilateral upper extremity supported, Reliant on assistive device for balance Standing balance-Leahy Scale: Poor Standing balance comment: forward trunk and flexed hips                           ADL either performed or assessed with clinical judgement   ADL Overall ADL's : Needs assistance/impaired Eating/Feeding: Set up   Grooming: Wash/dry hands;Wash/dry face;Oral care;Minimal assistance;Sitting   Upper Body Bathing: Minimal assistance;Sitting   Lower Body Bathing: Maximal assistance;Sit to/from stand   Upper Body Dressing : Minimal assistance;Sitting   Lower Body Dressing: Moderate assistance;Maximal assistance;Sit to/from stand   Toilet Transfer: Moderate assistance;Cueing for safety;BSC/3in1;Rolling walker (2 wheels)   Toileting- Clothing Manipulation and Hygiene: Moderate assistance;Cueing for safety;Sitting/lateral lean       Functional mobility during ADLs: Moderate assistance;Cueing for safety;Rolling walker (2 wheels) General ADL Comments: needs encouragement, can do more than puts forth     Vision Baseline Vision/History: 0 No visual deficits              Pertinent Vitals/Pain Pain Assessment Pain Assessment: Faces Faces Pain Scale: No hurt     Extremity/Trunk Assessment Upper Extremity Assessment Upper Extremity Assessment: Generalized  weakness;Right hand dominant   Lower Extremity Assessment Lower Extremity Assessment: Defer to PT evaluation   Cervical / Trunk Assessment Cervical / Trunk Assessment: Kyphotic   Communication Communication Communication: No apparent difficulties   Cognition Arousal: Alert Behavior During Therapy: Flat affect Cognition: History of cognitive impairments             OT - Cognition Comments: slower processing, decreased insight                 Following commands: Impaired Following commands impaired: Follows one step commands with increased time     Cueing  General Comments   Cueing Techniques: Verbal cues;Gestural cues;Tactile cues  poor activity tolerance, needs encouragement           Home Living Family/patient expects to be discharged to:: Private residence Living Arrangements: Spouse/significant other Available Help at Discharge: Family Type of Home: House Home Access: Stairs to enter Secretary/administrator of Steps: 1   Home Layout: One level     Bathroom Shower/Tub: Psychologist, counselling (uses commode in shower)   Bathroom Toilet: Standard     Home Equipment: Agricultural Consultant (2 wheels);BSC/3in1   Additional Comments: daughter present for session      Prior Functioning/Environment Prior Level of Function : Needs assist             Mobility Comments: uses RW for ambulation ADLs Comments: assist for showers via dtr    OT Problem List: Decreased strength;Decreased activity tolerance;Impaired balance (sitting and/or standing);Decreased safety awareness;Decreased cognition;Decreased knowledge of use of DME or AE;Decreased knowledge of precautions;Cardiopulmonary status limiting activity;Impaired UE functional use   OT Treatment/Interventions: Self-care/ADL training;Therapeutic exercise;Neuromuscular education;Energy conservation;DME and/or AE instruction;Therapeutic activities;Cognitive remediation/compensation;Patient/family education;Balance  training      OT Goals(Current goals can be found in the care plan section)   Acute Rehab OT Goals Patient Stated Goal: to go home with therapy OT Goal Formulation: With patient/family Time For Goal Achievement: 05/27/24 Potential to Achieve Goals: Fair ADL Goals Pt Will Perform Grooming: with set-up;sitting Pt Will Perform Upper Body Bathing: with set-up;sitting Pt Will Perform Upper Body Dressing: with set-up;sitting Pt Will Transfer to Toilet: with contact guard assist;bedside commode Pt Will Perform Toileting - Clothing Manipulation and hygiene: with contact guard assist;sitting/lateral leans Pt/caregiver will Perform Home Exercise Program: Increased strength;Both right and left upper extremity;With written HEP provided;With Supervision;With theraband   OT Frequency:  Min 2X/week       AM-PAC OT 6 Clicks Daily Activity     Outcome Measure Help from another person eating meals?: A Little Help from another person taking care of personal grooming?: A Little Help from another person toileting, which includes using toliet, bedpan, or urinal?: A Lot Help from another person bathing (including washing, rinsing, drying)?: A Lot Help from another person to put on and taking off regular upper body clothing?: A Little Help from another person to put on and taking off regular lower body clothing?: A Lot 6 Click Score: 15   End of Session Equipment Utilized During Treatment: Gait belt;Rolling walker (2 wheels) Nurse Communication: Mobility status  Activity Tolerance: Patient limited by fatigue Patient left: in bed;with call bell/phone within reach;with chair alarm set;in chair;with family/visitor present  OT Visit Diagnosis: Unsteadiness on feet (R26.81);Other abnormalities of gait and mobility (R26.89);Muscle weakness (generalized) (M62.81);Other symptoms and signs involving cognitive function                Time: 1150-1210 OT Time Calculation (min): 20 min Charges:  OT General  Charges $  OT Visit: 1 Visit OT Evaluation $OT Eval Low Complexity: 1 Low  Amber [name] OT/L Acute Rehabilitation Department  [phone]  05/13/2024, 6:26 PM

## 2024-05-13 NOTE — TOC Transition Note (Signed)
 Transition of Care [facility]) - Discharge Note   Patient Details  Name: Amber [name] MRN: [id number] Date of Birth: Feb 08, 1946  Transition of [facility]) CM/SW Contact:  Bascom Service, RN Phone Number: 05/13/2024, 11:25 AM   Clinical Narrative:  spoke to [name](dtr) about d/c plans-decline [facility]-d/c paln [facility] rep [facility] accepted for HHPT/OT/aide. Has rw,3n1. Has own transport home.     Final next level of care: Home w Home Health Services Barriers to Discharge: No Barriers Identified   Patient Goals and CMS Choice Patient states their goals for this hospitalization and ongoing recovery are:: home CMS Medicare.gov Compare Post Acute Care list provided to:: Patient Represenative (must comment) (dtr eleanor) Choice offered to / list presented to : Adult Children [place] ownership interest in [facility].provided to:: Adult Children    Discharge Placement                       Discharge Plan and Services Additional resources added to the After Visit Summary for     Discharge Planning Services: CM Consult Post Acute Care Choice: Home Health                    HH Arranged: PT, OT, Nurse's Aide HH Agency: [facility] Date HH Agency Contacted: 05/13/24 Time HH Agency Contacted: 1125 Representative spoke with at [facility] Agency: [facility]  Social Drivers of Health (SDOH) Interventions SDOH Screenings   Food Insecurity: No Food Insecurity (05/11/2024)  Housing: Low Risk (05/11/2024)  Transportation Needs: No Transportation Needs (05/11/2024)  Utilities: Not At Risk (05/11/2024)  Alcohol Screen: Low Risk (03/04/2023)  Depression (PHQ2-9): Medium Risk (02/12/2024)  Financial Resource Strain: Low Risk (08/04/2023)  Physical Activity: Unknown (08/04/2023)  Social Connections: Moderately Isolated (05/11/2024)  Stress: Stress Concern Present (08/04/2023)  Tobacco Use: Medium Risk (05/11/2024)  Health Literacy: Adequate Health Literacy  (03/04/2023)     Readmission Risk Interventions     No data to display

## 2024-05-13 NOTE — Progress Notes (Signed)
 Mobility Specialist - Progress Note   05/13/24 1125  Mobility  Activity Ambulated with assistance;Pivoted/transferred from bed to chair  Level of Assistance +2 (takes two people)  Press Photographer wheel walker  Distance Ambulated (ft) 3 ft  Range of Motion/Exercises Active  Activity Response Tolerated fair  Mobility visit 1 Mobility  Mobility Specialist Start Time (ACUTE ONLY) 1100  Mobility Specialist Stop Time (ACUTE ONLY) 1125  Mobility Specialist Time Calculation (min) (ACUTE ONLY) 25 min   Pt was found in bed and hesitant to mobilize but agreeable. When asked to do tasks would say can't do it but was able to brush her gums, wash her face and pivot to recliner chair. At EOS was left on recliner chair with call bell in reach and daughter in room.   Erminio Leos,  Mobility Specialist Can be reached via Secure Chat

## 2024-05-18 ENCOUNTER — Ambulatory Visit

## 2024-05-25 ENCOUNTER — Ambulatory Visit: Admitting: Urology

## 2024-05-26 ENCOUNTER — Encounter: Payer: Self-pay | Admitting: Nurse Practitioner

## 2024-05-26 ENCOUNTER — Ambulatory Visit: Payer: Self-pay | Admitting: Nurse Practitioner

## 2024-05-26 VITALS — BP 157/84 | HR 103

## 2024-05-26 DIAGNOSIS — N3000 Acute cystitis without hematuria: Secondary | ICD-10-CM

## 2024-05-26 DIAGNOSIS — Z09 Encounter for follow-up examination after completed treatment for conditions other than malignant neoplasm: Secondary | ICD-10-CM

## 2024-05-26 NOTE — Progress Notes (Signed)
 "  Subjective:    Patient ID: Amber [name], female    DOB: November 23, 1945, 78 y.o.   MRN: [id number]   Chief Complaint: Hospitalization Follow-up   HPI  Patient was taken to [facility] ED and was transferred to [facility] for admission. She was dx with UTI and near sepsis. She ended up having to have a stent placed in left ureter while she was there. Was treated with IV antibiotics. Was discharged on cipro  at home. Is doing better. No foul smelling urine. No dysuria. Family denies any confusion r fever.   Patient Active Problem List   Diagnosis Date Noted   Osteoporosis without current pathological fracture 04/01/2024   Due for screening 02/12/2024   Encounter for osteoporosis screening in asymptomatic postmenopausal patient 02/12/2024   Cognitive impairment 05/08/2023   Memory loss 02/13/2023   Rectal pressure 11/25/2022   Disorder of bone density and structure, unspecified 10/22/2022   Hypercalcemia 08/08/2022   Chronic nausea 04/29/2022   IBS (irritable bowel syndrome) 04/29/2022   Refused influenza vaccine 03/25/2022   Hospital discharge follow-up 12/31/2021   Protein-calorie malnutrition, severe 12/14/2021   Hydronephrosis with obstructing calculus 12/12/2021   UTI (urinary tract infection) 12/12/2021   Hypokalemia 12/12/2021   At risk for prolonged QT interval syndrome 12/10/2021   Chronic idiopathic constipation 11/21/2021   Multinodular goiter 11/07/2021   Thoracic aortic aneurysm 10/23/2021   MDD (major depressive disorder), recurrent, severe, with psychosis (HCC) 09/25/2021   High risk medication use 09/25/2021   Thyroid  nodule 09/18/2021   Pulmonary embolus (HCC) 08/16/2021   GAD (generalized anxiety disorder) 08/16/2021   Normocytic anemia 08/16/2021   Leg swelling 08/16/2021   Idiopathic peripheral neuropathy 08/16/2021   Sinus tachycardia 07/14/2021   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HCC) 06/19/2021   Chronic deep vein thrombosis (DVT) (HCC) 06/19/2021   Stage 3a chronic  kidney disease (HCC) 06/19/2021   Gross hematuria 06/19/2021   Dysphagia    Other hyperparathyroidism 06/07/2021   Hyperthyroidism 04/25/2021   Gastroesophageal reflux disease without esophagitis 04/25/2021   Gait disturbance 03/29/2021   Urinary incontinence 03/29/2021   Moderate protein-calorie malnutrition 03/29/2021   Depression with anxiety 03/29/2021   Aortic atherosclerosis 03/29/2021   Adnexal mass 03/29/2021       Review of Systems  Constitutional:  Negative for diaphoresis.  Eyes:  Negative for pain.  Respiratory:  Negative for shortness of breath.   Cardiovascular:  Negative for chest pain, palpitations and leg swelling.  Gastrointestinal:  Negative for abdominal pain.  Endocrine: Negative for polydipsia.  Skin:  Negative for rash.  Neurological:  Negative for dizziness, weakness and headaches.  Hematological:  Does not bruise/bleed easily.  All other systems reviewed and are negative.      Objective:   Physical Exam Constitutional:      Appearance: Normal appearance.  Cardiovascular:     Rate and Rhythm: Normal rate and regular rhythm.     Heart sounds: Normal heart sounds.  Pulmonary:     Effort: Pulmonary effort is normal.     Breath sounds: Normal breath sounds.  Skin:    General: Skin is warm.  Neurological:     General: No focal deficit present.     Mental Status: She is alert and oriented to person, place, and time.  Psychiatric:        Mood and Affect: Mood normal.        Behavior: Behavior normal.    BP (!) 157/84   Pulse (!) 103   SpO2 94%  Assessment & Plan:   Amber [name] in today with chief complaint of Hospitalization Follow-up   1. Acute cystitis without hematuria (Primary) Family will bring urine back for recheck - Urinalysis; Future - Urine Culture; Future  2. Hospital discharge follow-up Hospital records reviewed Keep follow up appt with urology    The above assessment and management plan was discussed  with the patient. The patient verbalized understanding of and has agreed to the management plan. Patient is aware to call the clinic if symptoms persist or worsen. Patient is aware when to return to the clinic for a follow-up visit. Patient educated on when it is appropriate to go to the emergency department.   Amber Gladis, FNP   "

## 2024-05-26 NOTE — Patient Instructions (Signed)
 Fall Prevention in the Home, Adult Falls can cause injuries and can happen to people of all ages. There are many things you can do to make your home safer and to help prevent falls. What actions can I take to prevent falls? General information Use good lighting in all rooms. Make sure to: Replace any light bulbs that burn out. Turn on the lights in dark areas and use night-lights. Keep items that you use often in easy-to-reach places. Lower the shelves around your home if needed. Move furniture so that there are clear paths around it. Do not use throw rugs or other things on the floor that can make you trip. If any of your floors are uneven, fix them. Add color or contrast paint or tape to clearly mark and help you see: Grab bars or handrails. First and last steps of staircases. Where the edge of each step is. If you use a ladder or stepladder: Make sure that it is fully opened. Do not climb a closed ladder. Make sure the sides of the ladder are locked in place. Have someone hold the ladder while you use it. Know where your pets are as you move through your home. What can I do in the bathroom?     Keep the floor dry. Clean up any water on the floor right away. Remove soap buildup in the bathtub or shower. Buildup makes bathtubs and showers slippery. Use non-skid mats or decals on the floor of the bathtub or shower. Attach bath mats securely with double-sided, non-slip rug tape. If you need to sit down in the shower, use a non-slip stool. Install grab bars by the toilet and in the bathtub and shower. Do not use towel bars as grab bars. What can I do in the bedroom? Make sure that you have a light by your bed that is easy to reach. Do not use any sheets or blankets on your bed that hang to the floor. Have a firm chair or bench with side arms that you can use for support when you get dressed. What can I do in the kitchen? Clean up any spills right away. If you need to reach something  above you, use a step stool with a grab bar. Keep electrical cords out of the way. Do not use floor polish or wax that makes floors slippery. What can I do with my stairs? Do not leave anything on the stairs. Make sure that you have a light switch at the top and the bottom of the stairs. Make sure that there are handrails on both sides of the stairs. Fix handrails that are broken or loose. Install non-slip stair treads on all your stairs if they do not have carpet. Avoid having throw rugs at the top or bottom of the stairs. Choose a carpet that does not hide the edge of the steps on the stairs. Make sure that the carpet is firmly attached to the stairs. Fix carpet that is loose or worn. What can I do on the outside of my home? Use bright outdoor lighting. Fix the edges of walkways and driveways and fix any cracks. Clear paths of anything that can make you trip, such as tools or rocks. Add color or contrast paint or tape to clearly mark and help you see anything that might make you trip as you walk through a door, such as a raised step or threshold. Trim any bushes or trees on paths to your home. Check to see if handrails are loose  or broken and that both sides of all steps have handrails. Install guardrails along the edges of any raised decks and porches. Have leaves, snow, or ice cleared regularly. Use sand, salt, or ice melter on paths if you live where there is ice and snow during the winter. Clean up any spills in your garage right away. This includes grease or oil spills. What other actions can I take? Review your medicines with your doctor. Some medicines can cause dizziness or changes in blood pressure, which increase your risk of falling. Wear shoes that: Have a low heel. Do not wear high heels. Have rubber bottoms and are closed at the toe. Feel good on your feet and fit well. Use tools that help you move around if needed. These include: Canes. Walkers. Scooters. Crutches. Ask  your doctor what else you can do to help prevent falls. This may include seeing a physical therapist to learn to do exercises to move better and get stronger. Where to find more information Centers for Disease Control and Prevention, STEAD[employer]: TonerPromos.no [employer] on Aging: BaseRingTones.pl National Institute on Aging: BaseRingTones.pl Contact a doctor if: You are afraid of falling at home. You feel weak, drowsy, or dizzy at home. You fall at home. Get help right away if you: Lose consciousness or have trouble moving after a fall. Have a fall that causes a head injury. These symptoms may be an emergency. Get help right away. Call 911. Do not wait to see if the symptoms will go away. Do not drive yourself to the hospital. This information is not intended to replace advice given to you by your health care provider. Make sure you discuss any questions you have with your health care provider. Document Revised: 01/14/2022 Document Reviewed: 01/14/2022 Elsevier Patient Education  2024 [employer].

## 2024-05-28 ENCOUNTER — Other Ambulatory Visit: Payer: Self-pay

## 2024-05-28 DIAGNOSIS — N3000 Acute cystitis without hematuria: Secondary | ICD-10-CM

## 2024-05-29 LAB — URINALYSIS
Bilirubin, UA: NEGATIVE
Glucose, UA: NEGATIVE
Ketones, UA: NEGATIVE
Nitrite, UA: POSITIVE — AB
Specific Gravity, UA: 1.019 (ref 1.005–1.030)
Urobilinogen, Ur: 1 mg/dL (ref 0.2–1.0)
pH, UA: 6.5 (ref 5.0–7.5)

## 2024-05-31 ENCOUNTER — Encounter: Payer: Self-pay | Admitting: Internal Medicine

## 2024-05-31 DIAGNOSIS — N3000 Acute cystitis without hematuria: Secondary | ICD-10-CM

## 2024-05-31 MED ORDER — CIPROFLOXACIN HCL 500 MG PO TABS: 500.0000 mg | ORAL_TABLET | Freq: Every day | ORAL | 0 refills | Status: AC

## 2024-06-01 ENCOUNTER — Ambulatory Visit: Payer: Self-pay | Admitting: Nurse Practitioner

## 2024-06-01 LAB — URINE CULTURE

## 2024-06-01 MED ORDER — SULFAMETHOXAZOLE-TRIMETHOPRIM 800-160 MG PO TABS: 1.0000 | ORAL_TABLET | Freq: Two times a day (BID) | ORAL | 0 refills | Status: DC

## 2024-06-08 ENCOUNTER — Other Ambulatory Visit: Payer: Self-pay | Admitting: Urology

## 2024-06-10 NOTE — Progress Notes (Signed)
 COVID Vaccine received:  [x]  No []  Yes Date of any COVID positive Test in last 90 days:  none  PCP - Suzzane Blanch, MD  [phone]   Cardiologist - none Neurology- Modena Callander, MD   Chest x-ray - 05-10-2024  1v  Epic EKG - 05-11-2024  Epic  Stress Test -  ECHO - 06-08-2021 Epic Cardiac Cath -  CT Coronary Calcium score:   Pacemaker / ICD device [x]  No []  Yes   Spinal Cord Stimulator:[x]  No []  Yes       History of Sleep Apnea? [x]  No []  Yes   CPAP used?- [x]  No []  Yes    Medication on DOS: Methimazole  (TAPAZOLE ), escitalopram  (LEXAPRO ) Sulfamethoxazole - Trimethoprim  (BACTRIM ), Cinacalcet  (SENSIPAR ) and  Acetaminophen  (TYLENOL ) if needed.   Patient has: [x]  NO Hx DM   []  Pre-DM   []  DM1  []   DM2 Does the patient monitor blood sugar?   [x]  N/A   []  No []  Yes   Blood Thinner / Instructions:  none Aspirin Instructions:  none  Activity level: Able to walk up 2 flights of stairs without becoming significantly short of breath or having chest pain?   []    Yes   [x]  No,  would have:  wheelchair bound, very frail Patient can perform ADLs without assistance.  []   Yes    []  No  Comments: Recent Hospitalization - UTI-sepsis, Type II MI- Demand ischemia   Patient was lucid and appropriate with her answers to all my PST questions. Her daughter, Eleanor Epps, was present and did not correct any of her answers. She has been taking all  her medications as directed with no lapses per her daughter.    Anesthesia review:  HTN, GAD, MDD, GERD,  CKD 3a, anemia, hx DVT / PE (05-2021)  cognitive impairment - mild dementia, memory loss, tardive dyskinesia  Patient denies any S&S of respiratory illness or Covid - no shortness of breath, fever, cough or chest pain at PAT appointment.  Patient verbalized understanding and agreement to the Pre-Surgical Instructions that were given to them at this PAT appointment. Patient was also educated of the need to review these PAT instructions again prior to her surgery.I  reviewed the appropriate phone numbers to call if they have any and questions or concerns.

## 2024-06-10 NOTE — Patient Instructions (Signed)
 SURGICAL WAITING ROOM VISITATION Patients having surgery or a procedure may have no more than 2 support people in the waiting area - these visitors may rotate in the visitor waiting room.   If the patient needs to stay at the hospital during part of their recovery, the visitor guidelines for inpatient rooms apply.  PRE-OP VISITATION  Pre-op nurse will coordinate an appropriate time for 1 support person to accompany the patient in pre-op.  This support person may not rotate.  This visitor will be contacted when the time is appropriate for the visitor to come back in the pre-op area.  To keep our patients, visitors and teammates safe and prevent the spread of respiratory illnesses over the next few months.  Temporary Visitor Restrictions  Children ages 35 and under will not be able to visit patients in [facility] under most circumstances. Visitation is not restricted outside of hospitals unless noted otherwise in the [facility] and Location Specific Visitation Guidelines at:       [email or web address]  Visitors with respiratory illnesses are discouraged from visiting and should remain at home. You are not required to quarantine at this time prior to your surgery. However, you must do this: Hand Hygiene often Do NOT share personal items Notify your provider if you are in close contact with someone who has COVID or you develop fever 100.4 or greater, new onset of sneezing, cough, sore throat, shortness of breath or body aches.  If you test positive for Covid or have been in contact with anyone that has tested positive in the last 10 days please notify you surgeon.    Your procedure is scheduled on:  Monday  06-14-2024  Report to [facility] Main Entrance: [address] entrance where the [employer] is available.   Report to admitting at: 05:45 AM  Call this number if you have any questions or problems the morning of surgery [phone]  DO NOT EAT OR DRINK ANYTHING AFTER  MIDNIGHT THE NIGHT PRIOR TO YOUR SURGERY / PROCEDURE.   FOLLOW  ANY ADDITIONAL PRE OP INSTRUCTIONS YOU RECEIVED FROM YOUR SURGEON'S OFFICE!!!   Oral Hygiene is also important to reduce your risk of infection.        Remember - BRUSH YOUR TEETH THE MORNING OF SURGERY WITH YOUR REGULAR TOOTHPASTE  Do NOT smoke after Midnight the night before surgery.  STOP TAKING all Vitamins, Herbs and supplements 1 week before your surgery.   Take ONLY these medicines the morning of surgery with A SIP OF WATER : Medication on DOS: Methimazole  (TAPAZOLE ), escitalopram  (LEXAPRO ) Sulfamethoxazole - Trimethoprim  (BACTRIM ), Cinacalcet  (SENSIPAR ) and  Acetaminophen  (TYLENOL ) if needed.                    You may not have any metal on your body including hair pins, jewelry, and body piercing  Do not wear make-up, lotions, powders, perfumes or deodorant  Do not wear nail polish including gel and S&S, artificial / acrylic nails, or any other type of covering on natural nails including finger and toenails. If you have artificial nails, gel coating, etc., that needs to be removed by a nail salon, Please have this removed prior to surgery. Not doing so may mean that your surgery could be cancelled or delayed if the Surgeon or anesthesia staff feels like they are unable to monitor you safely.   Do not shave 48 hours prior to surgery to avoid nicks in your skin which may contribute to postoperative infections.   Contacts, Hearing  Aids, dentures or bridgework may not be worn into surgery. DENTURES WILL BE REMOVED PRIOR TO SURGERY PLEASE DO NOT APPLY Poly grip OR ADHESIVES!!!  Patients discharged on the day of surgery will not be allowed to drive home.  Someone NEEDS to stay with you for the first 24 hours after anesthesia.  Do not bring your home medications to the hospital. The Pharmacy will dispense medications listed on your medication list to you during your admission in the Hospital.  Please read over the following  fact sheets you were given: IF YOU HAVE QUESTIONS ABOUT YOUR PRE-OP INSTRUCTIONS, PLEASE CALL [phone].   [place] - Preparing for Surgery        Before surgery, you can play an important role.  Because skin is not sterile, your skin needs to be as free of germs as possible.  You can reduce the number of germs on your skin by washing with CHG (chlorahexidine gluconate) soap before surgery.  CHG is an antiseptic cleaner which kills germs and bonds with the skin to continue killing germs even after washing. Please DO NOT use if you have an allergy to CHG or antibacterial soaps.  If your skin becomes reddened/irritated stop using the CHG and inform your nurse when you arrive at Short Stay. Do not shave (including legs and underarms) for at least 48 hours prior to the first CHG shower.  You may shave your face/neck.  Please follow these instructions carefully:  1.  Shower with CHG Soap the night before surgery ONLY (DO NOT USE THE CHG SOAP THE MORNING OF SURGERY).  2.  If you choose to wash your hair, wash your hair first as usual with your normal  shampoo.  3.  After you shampoo, rinse your hair and body thoroughly to remove the shampoo.                             4.  Use CHG as you would any other liquid soap.  You can apply chg directly to the skin and wash.  Gently with a scrungie or clean washcloth.  5.  Apply the CHG Soap to your body ONLY FROM THE NECK DOWN.   Do not use on face/ open                           Wound or open sores. Avoid contact with eyes, ears mouth and genitals (private parts).                       Wash face,  Genitals (private parts) with your normal soap.             6.  Wash thoroughly, paying special attention to the area where your  surgery  will be performed.  7.  Thoroughly rinse your body with warm water  from the neck down.  8.  DO NOT shower/wash with your normal soap after using and rinsing off the CHG Soap.                9.  Pat yourself dry with a clean  towel.            10.  Wear clean pajamas.            11.  Place clean sheets on your bed the night of your first shower and do not  sleep with pets.  Day of  Surgery : Do not apply any CHG, lotions/deodorants the morning of surgery.  Please wear clean clothes to the hospital/surgery center.   FAILURE TO FOLLOW THESE INSTRUCTIONS MAY RESULT IN THE CANCELLATION OF YOUR SURGERY  PATIENT SIGNATURE_________________________________  NURSE SIGNATURE__________________________________  ________________________________________________________________________

## 2024-06-11 ENCOUNTER — Other Ambulatory Visit: Payer: Self-pay

## 2024-06-11 ENCOUNTER — Encounter (HOSPITAL_COMMUNITY): Payer: Self-pay

## 2024-06-11 ENCOUNTER — Encounter (HOSPITAL_COMMUNITY)
Admission: RE | Admit: 2024-06-11 | Discharge: 2024-06-11 | Disposition: A | Discharge status: Home / Self Care | Source: Ambulatory Visit | Attending: Urology | Admitting: Urology

## 2024-06-11 VITALS — BP 111/90 | HR 98 | Temp 98.1°F | Resp 16 | Ht 66.0 in | Wt 185.0 lb

## 2024-06-11 DIAGNOSIS — J984 Other disorders of lung: Secondary | ICD-10-CM | POA: Diagnosis not present

## 2024-06-11 DIAGNOSIS — Z79899 Other long term (current) drug therapy: Secondary | ICD-10-CM | POA: Insufficient documentation

## 2024-06-11 DIAGNOSIS — I509 Heart failure, unspecified: Secondary | ICD-10-CM | POA: Diagnosis not present

## 2024-06-11 DIAGNOSIS — N132 Hydronephrosis with renal and ureteral calculous obstruction: Secondary | ICD-10-CM | POA: Diagnosis not present

## 2024-06-11 DIAGNOSIS — Z8673 Personal history of transient ischemic attack (TIA), and cerebral infarction without residual deficits: Secondary | ICD-10-CM | POA: Insufficient documentation

## 2024-06-11 DIAGNOSIS — E0590 Thyrotoxicosis, unspecified without thyrotoxic crisis or storm: Secondary | ICD-10-CM | POA: Insufficient documentation

## 2024-06-11 DIAGNOSIS — Z86711 Personal history of pulmonary embolism: Secondary | ICD-10-CM | POA: Insufficient documentation

## 2024-06-11 DIAGNOSIS — I447 Left bundle-branch block, unspecified: Secondary | ICD-10-CM | POA: Insufficient documentation

## 2024-06-11 DIAGNOSIS — Z01812 Encounter for preprocedural laboratory examination: Secondary | ICD-10-CM | POA: Diagnosis present

## 2024-06-11 DIAGNOSIS — K219 Gastro-esophageal reflux disease without esophagitis: Secondary | ICD-10-CM | POA: Diagnosis not present

## 2024-06-11 DIAGNOSIS — N1831 Chronic kidney disease, stage 3a: Secondary | ICD-10-CM | POA: Insufficient documentation

## 2024-06-11 DIAGNOSIS — F0390 Unspecified dementia without behavioral disturbance: Secondary | ICD-10-CM | POA: Insufficient documentation

## 2024-06-11 DIAGNOSIS — I132 Hypertensive heart and chronic kidney disease with heart failure and with stage 5 chronic kidney disease, or end stage renal disease: Secondary | ICD-10-CM | POA: Diagnosis not present

## 2024-06-11 DIAGNOSIS — F32A Depression, unspecified: Secondary | ICD-10-CM | POA: Insufficient documentation

## 2024-06-11 DIAGNOSIS — Z01818 Encounter for other preprocedural examination: Secondary | ICD-10-CM

## 2024-06-11 DIAGNOSIS — F419 Anxiety disorder, unspecified: Secondary | ICD-10-CM | POA: Insufficient documentation

## 2024-06-11 DIAGNOSIS — Z86718 Personal history of other venous thrombosis and embolism: Secondary | ICD-10-CM | POA: Insufficient documentation

## 2024-06-11 HISTORY — DX: Unspecified osteoarthritis, unspecified site: M19.90

## 2024-06-11 LAB — COMPREHENSIVE METABOLIC PANEL WITH GFR
ALT: 5 U/L (ref 0–44)
AST: 16 U/L (ref 15–41)
Albumin: 3.5 g/dL (ref 3.5–5.0)
Alkaline Phosphatase: 92 U/L (ref 38–126)
Anion gap: 12 (ref 5–15)
BUN: 15 mg/dL (ref 8–23)
CO2: 19 mmol/L — ABNORMAL LOW (ref 22–32)
Calcium: 9.8 mg/dL (ref 8.9–10.3)
Chloride: 108 mmol/L (ref 98–111)
Creatinine, Ser: 1.96 mg/dL — ABNORMAL HIGH (ref 0.44–1.00)
GFR, Estimated: 26 mL/min — ABNORMAL LOW
Glucose, Bld: 97 mg/dL (ref 70–99)
Potassium: 4.4 mmol/L (ref 3.5–5.1)
Sodium: 138 mmol/L (ref 135–145)
Total Bilirubin: 0.4 mg/dL (ref 0.0–1.2)
Total Protein: 6.7 g/dL (ref 6.5–8.1)

## 2024-06-11 LAB — CBC
HCT: 36.9 % (ref 36.0–46.0)
Hemoglobin: 11.3 g/dL — ABNORMAL LOW (ref 12.0–15.0)
MCH: 27.6 pg (ref 26.0–34.0)
MCHC: 30.6 g/dL (ref 30.0–36.0)
MCV: 90 fL (ref 80.0–100.0)
Platelets: 249 K/uL (ref 150–400)
RBC: 4.1 MIL/uL (ref 3.87–5.11)
RDW: 14.3 % (ref 11.5–15.5)
WBC: 7.8 K/uL (ref 4.0–10.5)
nRBC: 0 % (ref 0.0–0.2)

## 2024-06-11 NOTE — Progress Notes (Signed)
 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  Pt admitted from 12/15-12/28/25 for urosepsis 2/2 obstructive uropathy from a kidney stone. She went to the OR on 12/16 with Dr Carolee for urgent stent placement. No complications noted. Now scheduled for definitive tx.   Seen by PCP on 05/26/24 for hospital f/u visit. Noted to be stable.  Hx of chronic LBBB seen on EKG. Appears to date back at least till 2022 which are all the EKGs in our system. Echo doing in 2023 while admitted for severe depression and failure to thrive with concomitant COVID infection and sepsis showed mildly depressed EF to 45-50%  Hx of TAA measuring 4.0cm on CTA Chest in 09/2021.   Hx of hyperthyroid. Followed by Endocrinology. On Methimazole .   Hx of CVA identified on imaging incidentally so not admitted for CVA w/u. Also with hx of dementia and severe anxiety/depression requiring ECT tx in the past. Followed by Neurology.   Discussed with Dr JONELLE Needle regarding needing Cardiology f/u. Ok to proceed due to urgency of case  VS: BP (!) 111/90 Comment: left arm sitting  Pulse 98   Temp 36.7 C (Oral)   Resp 16   Ht 5' 6 (1.676 m)   Wt 83.9 kg   SpO2 98%   BMI 29.86 kg/m   PROVIDERS: Tobie Suzzane POUR, MD Neurology- Modena Callander, MD   LABS: Labs reviewed: Acceptable for surgery. (all labs ordered are listed, but only abnormal results  are displayed)  Labs Reviewed  COMPREHENSIVE METABOLIC PANEL WITH GFR - Abnormal; Notable for the following components:      Result Value   CO2 19 (*)    Creatinine, Ser 1.96 (*)    GFR, Estimated 26 (*)    All other components within normal limits  CBC - Abnormal; Notable for the following components:   Hemoglobin 11.3 (*)    All other components within normal limits     CXR 05/10/24:  IMPRESSION: 1. 7 mm nodular density in the left upper lobe. Recommend nonemergent chest CT for further characterization. 2. Cardiomegaly.   PET scan 11/08/2021:   IMPRESSION: Areas in the chest more suggestive of sequela pulmonary infarct and or post infectious or inflammatory changes. Consider three-month follow-up to assess for stability and or resolution.   Focal area of increased metabolic activity in the urinary bladder along the RIGHT urinary bladder with suggestion of subtle area of variable density in this location, suspicious for urothelial neoplasm. Cystoscopy may be helpful for further evaluation.   Obstructing RIGHT UPJ calculus with moderate to marked RIGHT-sided hydronephrosis, another more pressing reason for urologic consultation.   Enlarged and heterogeneous LEFT thyroid  lobe. Recommend thyroid  ultrasound (Ref: J Am Coll Radiol. 2015 Feb;12(2): 143-50).  EKG 05/11/24:  SR PACs LBBB   Echo 06/09/2021:  IMPRESSIONS    1. Left ventricular ejection fraction, by estimation, is 45 to 50%.  The left ventricle has mildly decreased function. The left ventricle has no regional wall motion abnormalities. There is mild concentric left ventricular hypertrophy. Left ventricular diastolic parameters are consistent with Grade I diastolic dysfunction (impaired relaxation).  2. Right ventricular systolic function is normal. The right ventricular size is normal. There is normal pulmonary artery systolic pressure.  3. Mild mitral valve regurgitation.  4. Aortic valve  regurgitation is not visualized.  5. Ectactic aorta. 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  6. The inferior vena cava is normal in size with greater than 50% respiratory variability, suggesting right atrial pressure of 3 mmHg.  Past Medical History:  Diagnosis Date   ABLA (acute blood loss anemia) 06/19/2021   Anxiety    Aortic atherosclerosis    Arthritis    Aspiration pneumonia of right lung due to gastric secretions (HCC)    C. difficile colitis 07/13/2021   COVID-19 virus infection 06/08/2021    Depression    DVT of leg (deep venous thrombosis) (HCC) 05/2021   Dysphagia    Failure to thrive in adult    GERD (gastroesophageal reflux disease)    History of kidney stones    right ureter   Hypertension    Hyperthyroidism    Kidney disease    Pulmonary embolism (HCC) 05/2021   Stroke [facility])    showed up in MRI   Thoracic aortic aneurysm    Urinary incontinence     Past Surgical History:  Procedure Laterality Date   BIOPSY  06/14/2022   Procedure: BIOPSY;  Surgeon: Eartha Angelia Sieving, MD;  [facility];  Service: Gastroenterology;;   COLONOSCOPY WITH PROPOFOL  N/A 06/14/2022   Procedure: COLONOSCOPY WITH PROPOFOL ;  Surgeon: Eartha Angelia Sieving, MD;  Location[facility];  Service: Gastroenterology;  Laterality: N/A;  11:00am, asa 3   CYSTOSCOPY W/ URETERAL STENT PLACEMENT Right 12/12/2021   Procedure: CYSTOSCOPY WITH RETROGRADE PYELOGRAM/URETERAL STENT PLACEMENT;  Surgeon: Penne Knee, MD;  Location: [facility];  Service: Urology;  Laterality: Right;   CYSTOSCOPY W/ URETERAL STENT PLACEMENT Left 05/11/2024   Procedure: CYSTOSCOPY, WITH RETROGRADE PYELOGRAM AND URETERAL STENT INSERTION;  Surgeon: Carolee Sherwood JONETTA DOUGLAS, MD;  Location: [facility];  Service: Urology;  Laterality: Left;   CYSTOSCOPY/URETEROSCOPY/HOLMIUM LASER/STENT PLACEMENT Right 01/14/2022   Procedure: CYSTOSCOPY/URETEROSCOPY/HOLMIUM LASER/STENT EXCHANGE;  Surgeon: Penne Knee, MD;   Location: [facility];  Service: Urology;  Laterality: Right;   ESOPHAGOGASTRODUODENOSCOPY (EGD) WITH PROPOFOL  N/A 06/14/2022   Procedure: ESOPHAGOGASTRODUODENOSCOPY (EGD) WITH PROPOFOL ;  Surgeon: Eartha Angelia Sieving, MD;  [facility];  Service: Gastroenterology;  Laterality: N/A;   POLYPECTOMY  06/14/2022   Procedure: POLYPECTOMY;  Surgeon: Eartha Angelia, Sieving, MD;  [facility];  Service: Gastroenterology;;    MEDICATIONS:  acetaminophen  (TYLENOL ) 500 MG tablet   alendronate  (FOSAMAX ) 70 MG tablet   ARIPiprazole  (ABILIFY ) 5 MG tablet   cinacalcet  (SENSIPAR ) 30 MG tablet   escitalopram  (LEXAPRO ) 20 MG tablet   ferrous sulfate  325 (65 FE) MG tablet   methimazole  (TAPAZOLE ) 5 MG tablet   mirtazapine  (REMERON ) 15 MG tablet   Multiple Vitamin (MULTIVITAMIN WITH MINERALS) TABS tablet   OVER THE COUNTER MEDICATION   polyethylene glycol powder (GLYCOLAX /MIRALAX ) 17 GM/SCOOP powder   sulfamethoxazole -trimethoprim  (BACTRIM  DS) 800-160 MG tablet   trimethoprim  (TRIMPEX ) 100 MG tablet   valbenazine  (INGREZZA ) 40 MG capsule   No current facility-administered medications for this encounter.          "

## 2024-06-11 NOTE — Anesthesia Preprocedure Evaluation (Signed)
"                                    Anesthesia Evaluation  Patient identified by MRN, date of birth, ID band Patient awake    Reviewed: Allergy & Precautions, NPO status , Patient's Chart, lab work & pertinent test results  History of Anesthesia Complications Negative for: history of anesthetic complications  Airway Mallampati: II  TM Distance: >3 FB Neck ROM: Full    Dental  (+) Edentulous Upper, Edentulous Lower   Pulmonary neg sleep apnea, neg COPD, Patient abstained from smoking.Not current smoker, former smoker   Pulmonary exam normal breath sounds clear to auscultation       Cardiovascular Exercise Tolerance: Good METShypertension, Pt. on medications (-) CAD and (-) Past MI (-) dysrhythmias  Rhythm:Regular Rate:Normal - Systolic murmurs    Neuro/Psych  PSYCHIATRIC DISORDERS Anxiety Depression    CVA    GI/Hepatic ,GERD  ,,(+)     (-) substance abuse  Denies Nausea or vomiting   Endo/Other  neg diabetes Hyperthyroidism   Renal/GU CRFRenal disease     Musculoskeletal   Abdominal   Peds  Hematology   Anesthesia Other Findings Past Medical History: 06/19/2021: ABLA (acute blood loss anemia) No date: Anxiety No date: Aortic atherosclerosis No date: Aspiration pneumonia of right lung due to gastric secretions  (HCC) 07/13/2021: C. difficile colitis No date: COVID-19 virus infection 06/08/2021 No date: Depression 05/2021: DVT of leg (deep venous thrombosis) (HCC) No date: Dysphagia No date: Failure to thrive in adult No date: GERD (gastroesophageal reflux disease) No date: History of kidney stones     Comment:  right ureter No date: Hypertension No date: Hyperthyroidism No date: Kidney disease 05/2021: Pulmonary embolism (HCC) No date: Stroke [facility])     Comment:  showed up in MRI No date: Thoracic aortic aneurysm No date: Urinary incontinence  Reproductive/Obstetrics                              Anesthesia  Physical Anesthesia Plan  ASA: 3  Anesthesia Plan: General   Post-op Pain Management: Minimal or no pain anticipated   Induction: Intravenous  PONV Risk Score and Plan: 3 and Propofol  infusion, TIVA, Ondansetron  and Treatment may vary due to age or medical condition  Airway Management Planned: Nasal Cannula  Additional Equipment: None  Intra-op Plan:   Post-operative Plan: Extubation in OR  Informed Consent: I have reviewed the patients History and Physical, chart, labs and discussed the procedure including the risks, benefits and alternatives for the proposed anesthesia with the patient or authorized representative who has indicated his/her understanding and acceptance.     Dental advisory given  Plan Discussed with: CRNA and Surgeon  Anesthesia Plan Comments: (Discussed risks of anesthesia with patient, including PONV, sore throat, lip/dental/eye damage. Rare risks discussed as well, such as cardiorespiratory and neurological sequelae, and allergic reactions. Discussed the role of CRNA in patient's perioperative care. Patient understands.)         Anesthesia Quick Evaluation  "

## 2024-06-14 ENCOUNTER — Encounter (HOSPITAL_COMMUNITY)
Admission: RE | Disposition: A | Discharge status: Home / Self Care | Payer: Self-pay | Source: Home / Self Care | Attending: Urology

## 2024-06-14 ENCOUNTER — Ambulatory Visit (HOSPITAL_COMMUNITY): Payer: Self-pay | Admitting: Certified Registered"

## 2024-06-14 ENCOUNTER — Other Ambulatory Visit: Payer: Self-pay

## 2024-06-14 ENCOUNTER — Ambulatory Visit (HOSPITAL_COMMUNITY)
Admission: RE | Admit: 2024-06-14 | Discharge: 2024-06-14 | Disposition: A | Discharge status: Home / Self Care | Attending: Urology | Admitting: Urology

## 2024-06-14 ENCOUNTER — Encounter (HOSPITAL_COMMUNITY): Payer: Self-pay | Admitting: Urology

## 2024-06-14 ENCOUNTER — Ambulatory Visit (HOSPITAL_COMMUNITY): Payer: Self-pay | Admitting: Medical

## 2024-06-14 ENCOUNTER — Ambulatory Visit (HOSPITAL_COMMUNITY)

## 2024-06-14 DIAGNOSIS — Z79899 Other long term (current) drug therapy: Secondary | ICD-10-CM | POA: Insufficient documentation

## 2024-06-14 DIAGNOSIS — F32A Depression, unspecified: Secondary | ICD-10-CM | POA: Diagnosis not present

## 2024-06-14 DIAGNOSIS — F418 Other specified anxiety disorders: Secondary | ICD-10-CM

## 2024-06-14 DIAGNOSIS — I10 Essential (primary) hypertension: Secondary | ICD-10-CM | POA: Insufficient documentation

## 2024-06-14 DIAGNOSIS — N201 Calculus of ureter: Secondary | ICD-10-CM

## 2024-06-14 DIAGNOSIS — F419 Anxiety disorder, unspecified: Secondary | ICD-10-CM | POA: Diagnosis not present

## 2024-06-14 DIAGNOSIS — Z87891 Personal history of nicotine dependence: Secondary | ICD-10-CM | POA: Insufficient documentation

## 2024-06-14 HISTORY — PX: CYSTOSCOPY, WITH RETROGRADE PYELOGRAM, URETEROSCOPY, URINARY CALCULUS LASER LITHOTRIPSY, AND STENT INSERT: SHX7550

## 2024-06-14 HISTORY — PX: CYSTOSCOPY W/ RETROGRADES: SHX1426

## 2024-06-14 MED ORDER — PROPOFOL 10 MG/ML IV BOLUS
INTRAVENOUS | Status: AC
  Filled 2024-06-14: qty 20

## 2024-06-14 MED ORDER — SODIUM CHLORIDE 0.9 % IV SOLN
2.0000 g | INTRAVENOUS | Status: AC
  Administered 2024-06-14: 2 g via INTRAVENOUS
  Filled 2024-06-14: qty 20

## 2024-06-14 MED ORDER — FENTANYL CITRATE (PF) 100 MCG/2ML IJ SOLN
INTRAMUSCULAR | Status: DC | PRN
  Administered 2024-06-14: 25 ug via INTRAVENOUS
  Administered 2024-06-14: 50 ug via INTRAVENOUS
  Administered 2024-06-14: 25 ug via INTRAVENOUS

## 2024-06-14 MED ORDER — EPHEDRINE SULFATE-NACL 50-0.9 MG/10ML-% IV SOSY
PREFILLED_SYRINGE | INTRAVENOUS | Status: DC | PRN
  Administered 2024-06-14: 10 mg via INTRAVENOUS
  Administered 2024-06-14: 15 mg via INTRAVENOUS

## 2024-06-14 MED ORDER — PHENYLEPHRINE 80 MCG/ML (10ML) SYRINGE FOR IV PUSH (FOR BLOOD PRESSURE SUPPORT)
PREFILLED_SYRINGE | INTRAVENOUS | Status: DC | PRN
  Administered 2024-06-14: 40 ug via INTRAVENOUS
  Administered 2024-06-14 (×2): 80 ug via INTRAVENOUS

## 2024-06-14 MED ORDER — LACTATED RINGERS IV SOLN: INTRAVENOUS | Status: DC

## 2024-06-14 MED ORDER — ONDANSETRON HCL 4 MG/2ML IJ SOLN
INTRAMUSCULAR | Status: DC | PRN
  Administered 2024-06-14: 4 mg via INTRAVENOUS

## 2024-06-14 MED ORDER — ONDANSETRON HCL 4 MG/2ML IJ SOLN
INTRAMUSCULAR | Status: AC
  Filled 2024-06-14: qty 2

## 2024-06-14 MED ORDER — IOHEXOL 300 MG/ML  SOLN
INTRAMUSCULAR | Status: DC | PRN
  Administered 2024-06-14: 7 mL via URETHRAL

## 2024-06-14 MED ORDER — SODIUM CHLORIDE 0.9 % IR SOLN
Status: DC | PRN
  Administered 2024-06-14: 3000 mL via INTRAVESICAL

## 2024-06-14 MED ORDER — LIDOCAINE HCL (CARDIAC) PF 100 MG/5ML IV SOSY
PREFILLED_SYRINGE | INTRAVENOUS | Status: DC | PRN
  Administered 2024-06-14: 50 mg via INTRAVENOUS

## 2024-06-14 MED ORDER — FENTANYL CITRATE (PF) 100 MCG/2ML IJ SOLN
INTRAMUSCULAR | Status: AC
  Filled 2024-06-14: qty 2

## 2024-06-14 MED ORDER — GLYCOPYRROLATE 0.2 MG/ML IJ SOLN
INTRAMUSCULAR | Status: DC | PRN
  Administered 2024-06-14: .2 mg via INTRAVENOUS

## 2024-06-14 MED ORDER — PHENYLEPHRINE 80 MCG/ML (10ML) SYRINGE FOR IV PUSH (FOR BLOOD PRESSURE SUPPORT)
PREFILLED_SYRINGE | INTRAVENOUS | Status: AC
  Filled 2024-06-14: qty 10

## 2024-06-14 MED ORDER — CHLORHEXIDINE GLUCONATE 0.12 % MT SOLN
15.0000 mL | Freq: Once | OROMUCOSAL | Status: AC
  Administered 2024-06-14: 15 mL via OROMUCOSAL

## 2024-06-14 MED ORDER — PROPOFOL 10 MG/ML IV BOLUS
INTRAVENOUS | Status: DC | PRN
  Administered 2024-06-14: 100 mg via INTRAVENOUS
  Administered 2024-06-14: 40 mg via INTRAVENOUS

## 2024-06-14 MED ORDER — DEXAMETHASONE SOD PHOSPHATE PF 10 MG/ML IJ SOLN
INTRAMUSCULAR | Status: DC | PRN
  Administered 2024-06-14: 5 mg via INTRAVENOUS

## 2024-06-14 MED ORDER — EPHEDRINE 5 MG/ML INJ
INTRAVENOUS | Status: AC
  Filled 2024-06-14: qty 5

## 2024-06-14 MED ORDER — FENTANYL CITRATE (PF) 50 MCG/ML IJ SOSY: 25.0000 ug | PREFILLED_SYRINGE | INTRAMUSCULAR | Status: DC | PRN

## 2024-06-14 MED ORDER — ORAL CARE MOUTH RINSE: 15.0000 mL | Freq: Once | OROMUCOSAL | Status: AC

## 2024-06-14 MED ORDER — OXYCODONE HCL 5 MG/5ML PO SOLN: 5.0000 mg | Freq: Once | ORAL | Status: DC | PRN

## 2024-06-14 MED ORDER — DROPERIDOL 2.5 MG/ML IJ SOLN: 0.6250 mg | Freq: Once | INTRAMUSCULAR | Status: DC | PRN

## 2024-06-14 MED ORDER — LIDOCAINE HCL (PF) 2 % IJ SOLN
INTRAMUSCULAR | Status: AC
  Filled 2024-06-14: qty 5

## 2024-06-14 MED ORDER — DEXAMETHASONE SOD PHOSPHATE PF 10 MG/ML IJ SOLN
INTRAMUSCULAR | Status: AC
  Filled 2024-06-14: qty 1

## 2024-06-14 MED ORDER — GLYCOPYRROLATE 0.2 MG/ML IJ SOLN
INTRAMUSCULAR | Status: AC
  Filled 2024-06-14: qty 1

## 2024-06-14 MED ORDER — OXYCODONE HCL 5 MG PO TABS: 5.0000 mg | ORAL_TABLET | Freq: Once | ORAL | Status: DC | PRN

## 2024-06-14 MED ORDER — ACETAMINOPHEN 10 MG/ML IV SOLN: 1000.0000 mg | Freq: Once | INTRAVENOUS | Status: DC | PRN

## 2024-06-14 NOTE — Anesthesia Postprocedure Evaluation (Signed)
"   Anesthesia Post Note  Patient: Amber [name]  Procedure(s) Performed: CYSTOSCOPY, WITH RETROGRADE PYELOGRAM, URETEROSCOPY, URINARY CALCULUS LASER LITHOTRIPSY, AND STENT INSERT (Left: Bladder) CYSTOSCOPY, WITH RETROGRADE PYELOGRAM (Left)     Patient location during evaluation: PACU Anesthesia Type: General Level of consciousness: awake and alert Pain management: pain level controlled Vital Signs Assessment: post-procedure vital signs reviewed and stable Respiratory status: spontaneous breathing, nonlabored ventilation, respiratory function stable and patient connected to nasal cannula oxygen Cardiovascular status: blood pressure returned to baseline and stable Postop Assessment: no apparent nausea or vomiting Anesthetic complications: no   No notable events documented.  Last Vitals:  Vitals:   06/14/24 0930 06/14/24 0938  BP: 110/68 136/83  Pulse: (!) 106 (!) 123  Resp: 20   Temp: (!) 36.4 C (!) 36.4 C  SpO2: 98% 96%    Last Pain:  Vitals:   06/14/24 0938  TempSrc:   PainSc: 0-No pain                 Rome Ade      "

## 2024-06-14 NOTE — Op Note (Signed)
 Operative Note  Preoperative diagnosis:  1.  Left ureteral calculi  Postoperative diagnosis: 1.  Left ureteral calculi  Procedure(s): 1.  Cystoscopy with left retrograde pyelogram, left ureteroscopy with laser lithotripsy, ureteral stent exchange  Surgeon: Sherwood Edison, MD  Assistants: None  Anesthesia: General  Complications: None immediate  EBL: Minimal  Specimens: 1.  None  Drains/Catheters: 1.  6 x 24 double-J ureteral stent on a string  Intraoperative findings: 1.  Normal urethra and bladder 2.  Approximately 6 mm proximal ureteral calculus fragmented to tiny fragments and evacuated from ureter.  Couple other smaller fragments evacuated from the ureter.  Tiny renal calculus that was fragmented to tiny pieces as well.  No large renal calculi in the kidney. 3.  Retrograde pyelogram after treatment of the stone showed no filling defect and some mild fullness in the renal pelvis  Indication: 79 year old female status post urgent ureteral stent placement for an obstructing left ureteral stone and UTI presents for definitive management of her stone  Description of procedure:  The patient was identified and consent was obtained.  The patient was taken to the operating room and placed in the supine position.  The patient was placed under general anesthesia.  Perioperative antibiotics were administered.  The patient was placed in dorsal lithotomy.  Patient was prepped and draped in a standard sterile fashion and a timeout was performed.  A 21 French rigid cystoscope was advanced into the urethra and into the bladder.  Complete cystoscopy was performed with findings noted above.  Stent was grasped pulled just beyond the urethral meatus.  A wire was advanced through the stent and up to the kidney under fluoroscopic guidance and then the stent was withdrawn.  Semirigid ureteroscopy was performed alongside the wire up to the stone.  I first tried to basket it but was a little bit too large  to basket so I dropped it and lasered fragmented on dust settings to tiny fragments.  It was a very soft stone and fragmented easily.  Scope was used to evacuate the stone fragments from the ureter.  Once the ureter was clear, I advanced a second wire through the ureteroscope and into the kidney.  A 12 x 14 ureteral access sheath was advanced over the wire under continuous fluoroscopic guidance up to the proximal ureter.  Inner sheath and wire were withdrawn.  Digital ureteroscopy was performed and a 3 mm stone was identified and was lasered fragmented all dust settings to tiny fragments.  There were no other stones seen in the kidney.  I showed a retrograde pyelogram through the scope with the findings noted above.  I withdrew the scope and access sheath visualizing the ureter upon removal.  There is no ureteral injury and no ureteral calculi were identified.  I backloaded the wire onto rigid cystoscope and advanced into the bladder followed by routine placement of a 6 x 24 double-J ureteral stent.  Fluoroscopy confirmed proximal placement and direct visualization confirmed a good coil in the bladder.  I drained the bladder and withdrew the scope.  Patient tolerated the procedure well was stable postoperatively.  Plan: Patient stent is on string.  She will be instructed to remove it on Thursday morning.

## 2024-06-14 NOTE — Interval H&P Note (Signed)
 History and Physical Interval Note:  06/14/2024 7:42 AM  Amber [name]  has presented today for surgery, with the diagnosis of LEFT URETERAL STONE.  The various methods of treatment have been discussed with the patient and family. After consideration of risks, benefits and other options for treatment, the patient has consented to  Procedures: CYSTOSCOPY, WITH RETROGRADE PYELOGRAM, URETEROSCOPY, URINARY CALCULUS LASER LITHOTRIPSY, AND STENT INSERT (Left) CYSTOSCOPY, WITH RETROGRADE PYELOGRAM (Left) as a surgical intervention.  The patient's history has been reviewed, patient examined, no change in status, stable for surgery.  I have reviewed the patient's chart and labs.  Questions were answered to the patient's satisfaction.     Sherwood [name], III

## 2024-06-14 NOTE — Transfer of Care (Signed)
 Immediate Anesthesia Transfer of Care Note  Patient: Amber [name]  Procedure(s) Performed: CYSTOSCOPY, WITH RETROGRADE PYELOGRAM, URETEROSCOPY, URINARY CALCULUS LASER LITHOTRIPSY, AND STENT INSERT (Left: Bladder) CYSTOSCOPY, WITH RETROGRADE PYELOGRAM (Left)  Patient Location: PACU  Anesthesia Type:General  Level of Consciousness: drowsy and responds to stimulation  Airway & Oxygen Therapy: Patient Spontanous Breathing and Patient connected to face mask oxygen  Post-op Assessment: Report given to RN and Post -op Vital signs reviewed and stable  Post vital signs: Reviewed and stable  Last Vitals:  Vitals Value Taken Time  BP 105/70 06/14/24 08:57  Temp    Pulse 109 06/14/24 08:58  Resp 14 06/14/24 08:58  SpO2 100 % 06/14/24 08:58  Vitals shown include unfiled device data.  Last Pain:  Vitals:   06/14/24 0638  TempSrc:   PainSc: 7          Complications: No notable events documented.

## 2024-06-14 NOTE — Anesthesia Procedure Notes (Signed)
 Procedure Name: LMA Insertion Date/Time: 06/14/2024 8:09 AM  Performed by: Metta Andrea NOVAK, CRNAPre-anesthesia Checklist: Patient identified, Emergency Drugs available, Suction available, Patient being monitored and Timeout performed Patient Re-evaluated:Patient Re-evaluated prior to induction Oxygen Delivery Method: Circle system utilized Preoxygenation: Pre-oxygenation with 100% oxygen Induction Type: IV induction Ventilation: Mask ventilation without difficulty LMA: LMA inserted LMA Size: 4.0 Number of attempts: 1 Placement Confirmation: positive ETCO2 Tube secured with: Tape Dental Injury: Teeth and Oropharynx as per pre-operative assessment

## 2024-06-15 ENCOUNTER — Encounter (HOSPITAL_COMMUNITY): Payer: Self-pay | Admitting: Urology

## 2024-06-17 ENCOUNTER — Ambulatory Visit: Admitting: "Endocrinology

## 2024-06-27 DEATH — deceased

## 2024-06-29 ENCOUNTER — Telehealth: Payer: Self-pay | Admitting: Internal Medicine

## 2024-06-29 NOTE — Telephone Encounter (Signed)
 Copied from CRM [id number]. Topic: General - Other >> Jun 29, 2024 10:26 AM Eva FALCON wrote: Reason for CRM: Arland from [facility] was calling to check on status of death certificate for this patient. States its pending in [facility] system and Dr. Tobie should know what to do. Arland is requesting a call back once this has been completed for this patient

## 2024-07-02 ENCOUNTER — Ambulatory Visit

## 2024-08-12 ENCOUNTER — Ambulatory Visit: Admitting: Internal Medicine

## 2024-09-30 ENCOUNTER — Ambulatory Visit: Admitting: "Endocrinology
